# Patient Record
Sex: Male | Born: 1965 | Race: Black or African American | Hispanic: No | Marital: Married | Smoking: Heavy tobacco smoker
Health system: Southern US, Community
[De-identification: ages and names within clinical notes are randomized; demographics above are authoritative.]

## PROBLEM LIST (undated history)

## (undated) DIAGNOSIS — D649 Anemia, unspecified: Secondary | ICD-10-CM

## (undated) DIAGNOSIS — Z831 Family history of other infectious and parasitic diseases: Secondary | ICD-10-CM

## (undated) DIAGNOSIS — I214 Non-ST elevation (NSTEMI) myocardial infarction: Secondary | ICD-10-CM

## (undated) DIAGNOSIS — A529 Late syphilis, unspecified: Secondary | ICD-10-CM

## (undated) DIAGNOSIS — A630 Anogenital (venereal) warts: Secondary | ICD-10-CM

## (undated) DIAGNOSIS — Z992 Dependence on renal dialysis: Secondary | ICD-10-CM

## (undated) DIAGNOSIS — I350 Nonrheumatic aortic (valve) stenosis: Secondary | ICD-10-CM

## (undated) DIAGNOSIS — Z9289 Personal history of other medical treatment: Secondary | ICD-10-CM

## (undated) DIAGNOSIS — I10 Essential (primary) hypertension: Secondary | ICD-10-CM

## (undated) DIAGNOSIS — R197 Diarrhea, unspecified: Secondary | ICD-10-CM

## (undated) DIAGNOSIS — M1990 Unspecified osteoarthritis, unspecified site: Secondary | ICD-10-CM

## (undated) DIAGNOSIS — B20 Human immunodeficiency virus [HIV] disease: Secondary | ICD-10-CM

## (undated) DIAGNOSIS — T82868A Thrombosis of vascular prosthetic devices, implants and grafts, initial encounter: Secondary | ICD-10-CM

## (undated) DIAGNOSIS — Z21 Asymptomatic human immunodeficiency virus [HIV] infection status: Secondary | ICD-10-CM

## (undated) DIAGNOSIS — N186 End stage renal disease: Secondary | ICD-10-CM

## (undated) DIAGNOSIS — I2510 Atherosclerotic heart disease of native coronary artery without angina pectoris: Secondary | ICD-10-CM

## (undated) HISTORY — DX: Human immunodeficiency virus (HIV) disease: B20

## (undated) HISTORY — DX: Essential (primary) hypertension: I10

## (undated) HISTORY — PX: INCISION AND DRAINAGE ABSCESS: SHX5864

## (undated) HISTORY — DX: Anogenital (venereal) warts: A63.0

## (undated) HISTORY — DX: Unspecified osteoarthritis, unspecified site: M19.90

## (undated) HISTORY — PX: TONSILLECTOMY: SUR1361

## (undated) HISTORY — DX: Asymptomatic human immunodeficiency virus (hiv) infection status: Z21

## (undated) HISTORY — DX: Family history of other infectious and parasitic diseases: Z83.1

## (undated) HISTORY — DX: Late syphilis, unspecified: A52.9

## (undated) HISTORY — PX: APPENDECTOMY: SHX54

---

## 1999-02-08 ENCOUNTER — Encounter: Payer: Self-pay | Admitting: Emergency Medicine

## 1999-02-08 ENCOUNTER — Encounter: Payer: Self-pay | Admitting: Family Medicine

## 1999-02-09 ENCOUNTER — Encounter: Payer: Self-pay | Admitting: Family Medicine

## 1999-02-09 ENCOUNTER — Inpatient Hospital Stay (HOSPITAL_COMMUNITY): Admission: EM | Admit: 1999-02-09 | Discharge: 1999-02-12 | Payer: Self-pay | Admitting: Emergency Medicine

## 1999-02-10 ENCOUNTER — Encounter: Payer: Self-pay | Admitting: Infectious Diseases

## 1999-02-10 ENCOUNTER — Encounter: Payer: Self-pay | Admitting: Family Medicine

## 1999-02-22 ENCOUNTER — Inpatient Hospital Stay (HOSPITAL_COMMUNITY): Admission: RE | Admit: 1999-02-22 | Discharge: 1999-04-02 | Payer: Self-pay | Admitting: Nephrology

## 1999-02-22 ENCOUNTER — Encounter: Payer: Self-pay | Admitting: Nephrology

## 1999-02-25 ENCOUNTER — Encounter: Payer: Self-pay | Admitting: Infectious Diseases

## 1999-02-26 ENCOUNTER — Encounter: Payer: Self-pay | Admitting: General Surgery

## 1999-02-27 ENCOUNTER — Encounter: Payer: Self-pay | Admitting: Nephrology

## 1999-02-28 ENCOUNTER — Encounter: Payer: Self-pay | Admitting: Nephrology

## 1999-03-01 ENCOUNTER — Encounter: Payer: Self-pay | Admitting: General Surgery

## 1999-03-08 ENCOUNTER — Encounter: Payer: Self-pay | Admitting: Nephrology

## 1999-03-19 ENCOUNTER — Encounter: Payer: Self-pay | Admitting: Nephrology

## 1999-03-22 ENCOUNTER — Encounter: Payer: Self-pay | Admitting: Nephrology

## 1999-04-02 ENCOUNTER — Encounter: Payer: Self-pay | Admitting: Nephrology

## 1999-04-10 ENCOUNTER — Encounter: Admission: RE | Admit: 1999-04-10 | Discharge: 1999-04-10 | Payer: Self-pay | Admitting: Internal Medicine

## 1999-04-11 ENCOUNTER — Encounter: Admission: RE | Admit: 1999-04-11 | Discharge: 1999-04-11 | Payer: Self-pay | Admitting: Internal Medicine

## 1999-04-26 ENCOUNTER — Encounter: Admission: RE | Admit: 1999-04-26 | Discharge: 1999-04-26 | Payer: Self-pay | Admitting: Internal Medicine

## 1999-04-26 ENCOUNTER — Ambulatory Visit (HOSPITAL_COMMUNITY): Admission: RE | Admit: 1999-04-26 | Discharge: 1999-04-26 | Payer: Self-pay | Admitting: Internal Medicine

## 1999-04-26 ENCOUNTER — Encounter (INDEPENDENT_AMBULATORY_CARE_PROVIDER_SITE_OTHER): Payer: Self-pay | Admitting: *Deleted

## 1999-04-26 LAB — CONVERTED CEMR LAB: CD4 Count: 220 microliters

## 1999-05-01 ENCOUNTER — Ambulatory Visit (HOSPITAL_COMMUNITY): Admission: RE | Admit: 1999-05-01 | Discharge: 1999-05-01 | Payer: Self-pay | Admitting: *Deleted

## 1999-05-02 ENCOUNTER — Ambulatory Visit (HOSPITAL_COMMUNITY): Admission: RE | Admit: 1999-05-02 | Discharge: 1999-05-02 | Payer: Self-pay | Admitting: *Deleted

## 1999-05-13 ENCOUNTER — Encounter: Admission: RE | Admit: 1999-05-13 | Discharge: 1999-05-13 | Payer: Self-pay | Admitting: Internal Medicine

## 1999-06-07 ENCOUNTER — Ambulatory Visit (HOSPITAL_COMMUNITY): Admission: RE | Admit: 1999-06-07 | Discharge: 1999-06-07 | Payer: Self-pay | Admitting: Hematology & Oncology

## 1999-06-10 ENCOUNTER — Encounter: Admission: RE | Admit: 1999-06-10 | Discharge: 1999-06-10 | Payer: Self-pay | Admitting: Internal Medicine

## 1999-08-09 ENCOUNTER — Ambulatory Visit (HOSPITAL_COMMUNITY): Admission: RE | Admit: 1999-08-09 | Discharge: 1999-08-09 | Payer: Self-pay | Admitting: Nephrology

## 1999-08-19 ENCOUNTER — Ambulatory Visit (HOSPITAL_COMMUNITY): Admission: RE | Admit: 1999-08-19 | Discharge: 1999-08-19 | Payer: Self-pay | Admitting: Internal Medicine

## 1999-08-19 ENCOUNTER — Ambulatory Visit (HOSPITAL_COMMUNITY): Admission: RE | Admit: 1999-08-19 | Discharge: 1999-08-19 | Payer: Self-pay | Admitting: Vascular Surgery

## 1999-08-19 ENCOUNTER — Encounter: Admission: RE | Admit: 1999-08-19 | Discharge: 1999-08-19 | Payer: Self-pay | Admitting: Internal Medicine

## 1999-09-09 ENCOUNTER — Encounter: Admission: RE | Admit: 1999-09-09 | Discharge: 1999-09-09 | Payer: Self-pay | Admitting: Internal Medicine

## 1999-09-11 ENCOUNTER — Ambulatory Visit (HOSPITAL_COMMUNITY): Admission: RE | Admit: 1999-09-11 | Discharge: 1999-09-11 | Payer: Self-pay | Admitting: Nephrology

## 1999-10-14 ENCOUNTER — Encounter: Admission: RE | Admit: 1999-10-14 | Discharge: 1999-10-14 | Payer: Self-pay | Admitting: Internal Medicine

## 1999-10-16 ENCOUNTER — Ambulatory Visit (HOSPITAL_COMMUNITY): Admission: RE | Admit: 1999-10-16 | Discharge: 1999-10-16 | Payer: Self-pay | Admitting: Nephrology

## 1999-10-31 ENCOUNTER — Emergency Department (HOSPITAL_COMMUNITY): Admission: EM | Admit: 1999-10-31 | Discharge: 1999-10-31 | Payer: Self-pay | Admitting: Emergency Medicine

## 1999-10-31 ENCOUNTER — Encounter: Payer: Self-pay | Admitting: Emergency Medicine

## 1999-11-22 ENCOUNTER — Emergency Department (HOSPITAL_COMMUNITY): Admission: EM | Admit: 1999-11-22 | Discharge: 1999-11-22 | Payer: Self-pay | Admitting: Emergency Medicine

## 1999-11-22 ENCOUNTER — Ambulatory Visit (HOSPITAL_COMMUNITY): Admission: RE | Admit: 1999-11-22 | Discharge: 1999-11-22 | Payer: Self-pay | Admitting: *Deleted

## 1999-12-09 ENCOUNTER — Encounter: Admission: RE | Admit: 1999-12-09 | Discharge: 1999-12-09 | Payer: Self-pay | Admitting: Internal Medicine

## 1999-12-09 ENCOUNTER — Ambulatory Visit (HOSPITAL_COMMUNITY): Admission: RE | Admit: 1999-12-09 | Discharge: 1999-12-09 | Payer: Self-pay | Admitting: Internal Medicine

## 1999-12-23 ENCOUNTER — Encounter: Admission: RE | Admit: 1999-12-23 | Discharge: 1999-12-23 | Payer: Self-pay | Admitting: Internal Medicine

## 2000-02-24 ENCOUNTER — Ambulatory Visit (HOSPITAL_COMMUNITY): Admission: RE | Admit: 2000-02-24 | Discharge: 2000-02-24 | Payer: Self-pay | Admitting: Internal Medicine

## 2000-02-24 ENCOUNTER — Encounter: Admission: RE | Admit: 2000-02-24 | Discharge: 2000-02-24 | Payer: Self-pay | Admitting: Internal Medicine

## 2000-03-16 ENCOUNTER — Encounter: Admission: RE | Admit: 2000-03-16 | Discharge: 2000-03-16 | Payer: Self-pay | Admitting: Internal Medicine

## 2000-05-21 ENCOUNTER — Emergency Department (HOSPITAL_COMMUNITY): Admission: EM | Admit: 2000-05-21 | Discharge: 2000-05-21 | Payer: Self-pay | Admitting: Emergency Medicine

## 2000-05-21 ENCOUNTER — Encounter: Payer: Self-pay | Admitting: Emergency Medicine

## 2000-07-01 ENCOUNTER — Ambulatory Visit (HOSPITAL_COMMUNITY): Admission: RE | Admit: 2000-07-01 | Discharge: 2000-07-01 | Payer: Self-pay | Admitting: Internal Medicine

## 2000-07-01 ENCOUNTER — Encounter: Admission: RE | Admit: 2000-07-01 | Discharge: 2000-07-01 | Payer: Self-pay | Admitting: Internal Medicine

## 2000-07-06 ENCOUNTER — Encounter (INDEPENDENT_AMBULATORY_CARE_PROVIDER_SITE_OTHER): Payer: Self-pay | Admitting: *Deleted

## 2000-07-06 ENCOUNTER — Ambulatory Visit (HOSPITAL_BASED_OUTPATIENT_CLINIC_OR_DEPARTMENT_OTHER): Admission: RE | Admit: 2000-07-06 | Discharge: 2000-07-06 | Payer: Self-pay | Admitting: General Surgery

## 2000-09-14 ENCOUNTER — Encounter: Admission: RE | Admit: 2000-09-14 | Discharge: 2000-09-14 | Payer: Self-pay | Admitting: *Deleted

## 2000-09-30 ENCOUNTER — Encounter: Payer: Self-pay | Admitting: Nephrology

## 2000-09-30 ENCOUNTER — Ambulatory Visit (HOSPITAL_COMMUNITY): Admission: RE | Admit: 2000-09-30 | Discharge: 2000-09-30 | Payer: Self-pay | Admitting: Nephrology

## 2000-10-14 ENCOUNTER — Encounter: Payer: Self-pay | Admitting: Vascular Surgery

## 2000-10-16 ENCOUNTER — Ambulatory Visit (HOSPITAL_COMMUNITY): Admission: RE | Admit: 2000-10-16 | Discharge: 2000-10-16 | Payer: Self-pay | Admitting: Vascular Surgery

## 2000-11-23 ENCOUNTER — Ambulatory Visit (HOSPITAL_COMMUNITY): Admission: RE | Admit: 2000-11-23 | Discharge: 2000-11-23 | Payer: Self-pay | Admitting: Internal Medicine

## 2000-11-23 ENCOUNTER — Encounter: Admission: RE | Admit: 2000-11-23 | Discharge: 2000-11-23 | Payer: Self-pay | Admitting: Internal Medicine

## 2000-12-09 ENCOUNTER — Encounter: Admission: RE | Admit: 2000-12-09 | Discharge: 2000-12-09 | Payer: Self-pay | Admitting: *Deleted

## 2000-12-14 ENCOUNTER — Encounter: Admission: RE | Admit: 2000-12-14 | Discharge: 2000-12-14 | Payer: Self-pay | Admitting: Internal Medicine

## 2000-12-26 ENCOUNTER — Encounter: Admission: RE | Admit: 2000-12-26 | Discharge: 2000-12-26 | Payer: Self-pay | Admitting: *Deleted

## 2000-12-28 ENCOUNTER — Ambulatory Visit (HOSPITAL_COMMUNITY): Admission: RE | Admit: 2000-12-28 | Discharge: 2000-12-28 | Payer: Self-pay | Admitting: *Deleted

## 2000-12-31 ENCOUNTER — Encounter (INDEPENDENT_AMBULATORY_CARE_PROVIDER_SITE_OTHER): Payer: Self-pay | Admitting: *Deleted

## 2000-12-31 ENCOUNTER — Encounter: Payer: Self-pay | Admitting: Nephrology

## 2000-12-31 ENCOUNTER — Encounter (INDEPENDENT_AMBULATORY_CARE_PROVIDER_SITE_OTHER): Payer: Self-pay | Admitting: Specialist

## 2000-12-31 ENCOUNTER — Encounter: Payer: Self-pay | Admitting: Emergency Medicine

## 2000-12-31 ENCOUNTER — Inpatient Hospital Stay (HOSPITAL_COMMUNITY): Admission: EM | Admit: 2000-12-31 | Discharge: 2001-01-17 | Payer: Self-pay | Admitting: Emergency Medicine

## 2001-01-04 ENCOUNTER — Encounter: Payer: Self-pay | Admitting: Nephrology

## 2001-01-12 ENCOUNTER — Encounter: Payer: Self-pay | Admitting: Nephrology

## 2001-02-02 ENCOUNTER — Encounter: Admission: RE | Admit: 2001-02-02 | Discharge: 2001-02-02 | Payer: Self-pay | Admitting: Infectious Diseases

## 2001-02-02 ENCOUNTER — Ambulatory Visit (HOSPITAL_COMMUNITY): Admission: RE | Admit: 2001-02-02 | Discharge: 2001-02-02 | Payer: Self-pay | Admitting: Infectious Diseases

## 2001-06-04 ENCOUNTER — Ambulatory Visit (HOSPITAL_COMMUNITY): Admission: RE | Admit: 2001-06-04 | Discharge: 2001-06-04 | Payer: Self-pay | Admitting: Nephrology

## 2001-06-04 ENCOUNTER — Encounter: Payer: Self-pay | Admitting: Nephrology

## 2001-06-14 ENCOUNTER — Encounter: Payer: Self-pay | Admitting: Orthopedic Surgery

## 2001-06-14 ENCOUNTER — Encounter: Admission: RE | Admit: 2001-06-14 | Discharge: 2001-06-14 | Payer: Self-pay | Admitting: Orthopedic Surgery

## 2001-11-18 ENCOUNTER — Emergency Department (HOSPITAL_COMMUNITY): Admission: EM | Admit: 2001-11-18 | Discharge: 2001-11-19 | Payer: Self-pay | Admitting: *Deleted

## 2001-11-19 ENCOUNTER — Encounter: Payer: Self-pay | Admitting: Internal Medicine

## 2002-01-04 ENCOUNTER — Ambulatory Visit (HOSPITAL_COMMUNITY): Admission: RE | Admit: 2002-01-04 | Discharge: 2002-01-04 | Payer: Self-pay | Admitting: Internal Medicine

## 2002-01-04 ENCOUNTER — Encounter: Admission: RE | Admit: 2002-01-04 | Discharge: 2002-01-04 | Payer: Self-pay | Admitting: Internal Medicine

## 2002-01-21 ENCOUNTER — Encounter: Admission: RE | Admit: 2002-01-21 | Discharge: 2002-01-21 | Payer: Self-pay | Admitting: Internal Medicine

## 2002-05-08 ENCOUNTER — Emergency Department (HOSPITAL_COMMUNITY): Admission: EM | Admit: 2002-05-08 | Discharge: 2002-05-08 | Payer: Self-pay | Admitting: Emergency Medicine

## 2002-05-08 ENCOUNTER — Encounter: Payer: Self-pay | Admitting: Emergency Medicine

## 2002-05-09 ENCOUNTER — Encounter: Payer: Self-pay | Admitting: Emergency Medicine

## 2002-06-06 ENCOUNTER — Emergency Department (HOSPITAL_COMMUNITY): Admission: EM | Admit: 2002-06-06 | Discharge: 2002-06-06 | Payer: Self-pay | Admitting: Emergency Medicine

## 2002-06-10 ENCOUNTER — Emergency Department (HOSPITAL_COMMUNITY): Admission: EM | Admit: 2002-06-10 | Discharge: 2002-06-10 | Payer: Self-pay | Admitting: Emergency Medicine

## 2003-02-09 ENCOUNTER — Emergency Department (HOSPITAL_COMMUNITY): Admission: EM | Admit: 2003-02-09 | Discharge: 2003-02-09 | Payer: Self-pay | Admitting: Emergency Medicine

## 2003-09-11 ENCOUNTER — Emergency Department (HOSPITAL_COMMUNITY): Admission: EM | Admit: 2003-09-11 | Discharge: 2003-09-11 | Payer: Self-pay | Admitting: Family Medicine

## 2004-01-05 ENCOUNTER — Ambulatory Visit (HOSPITAL_COMMUNITY): Admission: RE | Admit: 2004-01-05 | Discharge: 2004-01-05 | Payer: Self-pay | Admitting: Nephrology

## 2004-01-10 ENCOUNTER — Emergency Department (HOSPITAL_COMMUNITY): Admission: EM | Admit: 2004-01-10 | Discharge: 2004-01-10 | Payer: Self-pay | Admitting: Emergency Medicine

## 2004-02-17 ENCOUNTER — Ambulatory Visit (HOSPITAL_COMMUNITY): Admission: RE | Admit: 2004-02-17 | Discharge: 2004-02-17 | Payer: Self-pay | Admitting: Nephrology

## 2004-02-17 ENCOUNTER — Inpatient Hospital Stay (HOSPITAL_COMMUNITY): Admission: EM | Admit: 2004-02-17 | Discharge: 2004-02-19 | Payer: Self-pay | Admitting: *Deleted

## 2004-02-21 ENCOUNTER — Encounter: Admission: RE | Admit: 2004-02-21 | Discharge: 2004-02-21 | Payer: Self-pay | Admitting: Nephrology

## 2004-03-05 ENCOUNTER — Encounter: Admission: RE | Admit: 2004-03-05 | Discharge: 2004-03-05 | Payer: Self-pay | Admitting: Internal Medicine

## 2004-04-08 ENCOUNTER — Encounter: Admission: RE | Admit: 2004-04-08 | Discharge: 2004-04-08 | Payer: Self-pay | Admitting: Internal Medicine

## 2004-07-12 ENCOUNTER — Encounter: Admission: RE | Admit: 2004-07-12 | Discharge: 2004-07-12 | Payer: Self-pay | Admitting: Nephrology

## 2004-09-27 ENCOUNTER — Encounter: Admission: RE | Admit: 2004-09-27 | Discharge: 2004-09-27 | Payer: Self-pay | Admitting: Nephrology

## 2004-10-16 ENCOUNTER — Ambulatory Visit (HOSPITAL_COMMUNITY): Admission: RE | Admit: 2004-10-16 | Discharge: 2004-10-16 | Payer: Self-pay | Admitting: Nephrology

## 2004-10-25 ENCOUNTER — Ambulatory Visit: Payer: Self-pay | Admitting: Cardiology

## 2004-10-31 ENCOUNTER — Ambulatory Visit (HOSPITAL_COMMUNITY): Admission: RE | Admit: 2004-10-31 | Discharge: 2004-10-31 | Payer: Self-pay | Admitting: Nephrology

## 2004-11-20 ENCOUNTER — Ambulatory Visit: Payer: Self-pay

## 2004-11-29 ENCOUNTER — Encounter: Payer: Self-pay | Admitting: Cardiology

## 2004-11-29 ENCOUNTER — Ambulatory Visit (HOSPITAL_COMMUNITY): Admission: RE | Admit: 2004-11-29 | Discharge: 2004-11-29 | Payer: Self-pay | Admitting: Cardiology

## 2004-11-29 ENCOUNTER — Ambulatory Visit: Payer: Self-pay | Admitting: Cardiology

## 2005-02-04 ENCOUNTER — Ambulatory Visit: Payer: Self-pay | Admitting: Internal Medicine

## 2005-02-04 ENCOUNTER — Ambulatory Visit (HOSPITAL_COMMUNITY): Admission: RE | Admit: 2005-02-04 | Discharge: 2005-02-04 | Payer: Self-pay | Admitting: Internal Medicine

## 2005-02-26 ENCOUNTER — Ambulatory Visit (HOSPITAL_COMMUNITY): Admission: RE | Admit: 2005-02-26 | Discharge: 2005-02-26 | Payer: Self-pay | Admitting: Nephrology

## 2005-04-14 ENCOUNTER — Encounter: Admission: RE | Admit: 2005-04-14 | Discharge: 2005-04-14 | Payer: Self-pay | Admitting: Nephrology

## 2005-06-01 ENCOUNTER — Emergency Department (HOSPITAL_COMMUNITY): Admission: EM | Admit: 2005-06-01 | Discharge: 2005-06-01 | Payer: Self-pay | Admitting: Emergency Medicine

## 2005-07-28 ENCOUNTER — Ambulatory Visit: Payer: Self-pay | Admitting: Internal Medicine

## 2005-07-28 ENCOUNTER — Ambulatory Visit (HOSPITAL_COMMUNITY): Admission: RE | Admit: 2005-07-28 | Discharge: 2005-07-28 | Payer: Self-pay | Admitting: Internal Medicine

## 2005-07-28 ENCOUNTER — Encounter (INDEPENDENT_AMBULATORY_CARE_PROVIDER_SITE_OTHER): Payer: Self-pay | Admitting: *Deleted

## 2005-07-28 LAB — CONVERTED CEMR LAB: CD4 Count: 170 microliters

## 2005-07-30 ENCOUNTER — Emergency Department (HOSPITAL_COMMUNITY): Admission: EM | Admit: 2005-07-30 | Discharge: 2005-07-30 | Payer: Self-pay | Admitting: Emergency Medicine

## 2005-08-26 ENCOUNTER — Ambulatory Visit: Payer: Self-pay | Admitting: Internal Medicine

## 2005-08-27 ENCOUNTER — Ambulatory Visit (HOSPITAL_COMMUNITY): Admission: RE | Admit: 2005-08-27 | Discharge: 2005-08-27 | Payer: Self-pay | Admitting: Nephrology

## 2005-09-06 ENCOUNTER — Emergency Department (HOSPITAL_COMMUNITY): Admission: EM | Admit: 2005-09-06 | Discharge: 2005-09-06 | Payer: Self-pay | Admitting: Emergency Medicine

## 2005-10-29 ENCOUNTER — Ambulatory Visit: Payer: Self-pay | Admitting: Infectious Diseases

## 2005-10-29 ENCOUNTER — Encounter (INDEPENDENT_AMBULATORY_CARE_PROVIDER_SITE_OTHER): Payer: Self-pay | Admitting: *Deleted

## 2005-10-29 LAB — CONVERTED CEMR LAB: CD4 Count: 240 microliters

## 2005-11-19 ENCOUNTER — Emergency Department (HOSPITAL_COMMUNITY): Admission: EM | Admit: 2005-11-19 | Discharge: 2005-11-19 | Payer: Self-pay | Admitting: Emergency Medicine

## 2005-12-16 ENCOUNTER — Ambulatory Visit: Payer: Self-pay | Admitting: Internal Medicine

## 2005-12-19 ENCOUNTER — Encounter: Admission: RE | Admit: 2005-12-19 | Discharge: 2005-12-19 | Payer: Self-pay | Admitting: Nephrology

## 2006-01-02 ENCOUNTER — Encounter: Admission: RE | Admit: 2006-01-02 | Discharge: 2006-01-02 | Payer: Self-pay | Admitting: Nephrology

## 2006-01-08 ENCOUNTER — Encounter: Payer: Self-pay | Admitting: Internal Medicine

## 2006-01-08 ENCOUNTER — Encounter (INDEPENDENT_AMBULATORY_CARE_PROVIDER_SITE_OTHER): Payer: Self-pay | Admitting: *Deleted

## 2006-01-08 LAB — CONVERTED CEMR LAB: CD4 Count: 447 microliters

## 2006-01-09 ENCOUNTER — Ambulatory Visit (HOSPITAL_COMMUNITY): Admission: RE | Admit: 2006-01-09 | Discharge: 2006-01-09 | Payer: Self-pay | Admitting: Nephrology

## 2006-01-12 ENCOUNTER — Ambulatory Visit (HOSPITAL_COMMUNITY): Admission: RE | Admit: 2006-01-12 | Discharge: 2006-01-12 | Payer: Self-pay | Admitting: Nephrology

## 2006-01-28 ENCOUNTER — Ambulatory Visit: Payer: Self-pay | Admitting: Emergency Medicine

## 2006-01-28 ENCOUNTER — Encounter: Payer: Self-pay | Admitting: Nephrology

## 2006-06-05 ENCOUNTER — Ambulatory Visit (HOSPITAL_COMMUNITY): Admission: RE | Admit: 2006-06-05 | Discharge: 2006-06-05 | Payer: Self-pay | Admitting: Nephrology

## 2006-07-22 ENCOUNTER — Ambulatory Visit: Payer: Self-pay | Admitting: Internal Medicine

## 2006-10-19 DIAGNOSIS — A539 Syphilis, unspecified: Secondary | ICD-10-CM

## 2006-10-19 DIAGNOSIS — A318 Other mycobacterial infections: Secondary | ICD-10-CM

## 2006-10-19 DIAGNOSIS — F329 Major depressive disorder, single episode, unspecified: Secondary | ICD-10-CM | POA: Insufficient documentation

## 2006-10-19 DIAGNOSIS — F17200 Nicotine dependence, unspecified, uncomplicated: Secondary | ICD-10-CM | POA: Insufficient documentation

## 2006-10-19 DIAGNOSIS — A6000 Herpesviral infection of urogenital system, unspecified: Secondary | ICD-10-CM | POA: Insufficient documentation

## 2006-10-19 DIAGNOSIS — B078 Other viral warts: Secondary | ICD-10-CM | POA: Insufficient documentation

## 2006-10-19 DIAGNOSIS — N186 End stage renal disease: Secondary | ICD-10-CM

## 2006-11-23 ENCOUNTER — Encounter (INDEPENDENT_AMBULATORY_CARE_PROVIDER_SITE_OTHER): Payer: Self-pay | Admitting: *Deleted

## 2006-11-23 LAB — CONVERTED CEMR LAB

## 2006-12-06 ENCOUNTER — Encounter (INDEPENDENT_AMBULATORY_CARE_PROVIDER_SITE_OTHER): Payer: Self-pay | Admitting: *Deleted

## 2007-01-17 ENCOUNTER — Emergency Department (HOSPITAL_COMMUNITY): Admission: EM | Admit: 2007-01-17 | Discharge: 2007-01-17 | Payer: Self-pay | Admitting: Emergency Medicine

## 2007-02-15 ENCOUNTER — Telehealth: Payer: Self-pay | Admitting: Internal Medicine

## 2007-02-16 ENCOUNTER — Ambulatory Visit: Payer: Self-pay | Admitting: Internal Medicine

## 2007-02-16 ENCOUNTER — Emergency Department (HOSPITAL_COMMUNITY): Admission: EM | Admit: 2007-02-16 | Discharge: 2007-02-16 | Payer: Self-pay | Admitting: Emergency Medicine

## 2007-03-25 ENCOUNTER — Encounter: Payer: Self-pay | Admitting: Internal Medicine

## 2007-07-08 ENCOUNTER — Telehealth: Payer: Self-pay | Admitting: Internal Medicine

## 2007-07-30 ENCOUNTER — Encounter: Admission: RE | Admit: 2007-07-30 | Discharge: 2007-07-30 | Payer: Self-pay | Admitting: Internal Medicine

## 2007-07-30 ENCOUNTER — Ambulatory Visit: Payer: Self-pay | Admitting: Internal Medicine

## 2007-07-30 LAB — CONVERTED CEMR LAB
ALT: 21 units/L (ref 0–53)
AST: 29 units/L (ref 0–37)
Alkaline Phosphatase: 303 units/L — ABNORMAL HIGH (ref 39–117)
Basophils Absolute: 0 10*3/uL (ref 0.0–0.1)
Basophils Relative: 1 % (ref 0–1)
Creatinine, Ser: 8.88 mg/dL — ABNORMAL HIGH (ref 0.40–1.50)
Eosinophils Absolute: 0 10*3/uL (ref 0.0–0.7)
Eosinophils Relative: 0 % (ref 0–5)
HCT: 42 % (ref 39.0–52.0)
Hemoglobin: 12.2 g/dL — ABNORMAL LOW (ref 13.0–17.0)
MCHC: 29 g/dL — ABNORMAL LOW (ref 30.0–36.0)
Monocytes Absolute: 0.9 10*3/uL — ABNORMAL HIGH (ref 0.2–0.7)
RDW: 21 % — ABNORMAL HIGH (ref 11.5–14.0)
Total Bilirubin: 0.6 mg/dL (ref 0.3–1.2)

## 2007-08-12 ENCOUNTER — Ambulatory Visit: Payer: Self-pay | Admitting: Internal Medicine

## 2007-08-12 LAB — CONVERTED CEMR LAB
HIV 1 RNA Quant: 16400 copies/mL — ABNORMAL HIGH (ref <50)
HIV-1 RNA Quant, Log: 4.21 — ABNORMAL HIGH (ref <1.70)

## 2007-08-20 ENCOUNTER — Telehealth: Payer: Self-pay | Admitting: Internal Medicine

## 2007-08-20 ENCOUNTER — Ambulatory Visit: Payer: Self-pay | Admitting: Internal Medicine

## 2007-08-31 ENCOUNTER — Encounter: Payer: Self-pay | Admitting: Internal Medicine

## 2007-09-13 ENCOUNTER — Encounter (INDEPENDENT_AMBULATORY_CARE_PROVIDER_SITE_OTHER): Payer: Self-pay | Admitting: *Deleted

## 2007-09-14 ENCOUNTER — Ambulatory Visit: Payer: Self-pay | Admitting: Internal Medicine

## 2007-11-15 ENCOUNTER — Encounter: Payer: Self-pay | Admitting: Internal Medicine

## 2007-11-15 ENCOUNTER — Encounter: Admission: RE | Admit: 2007-11-15 | Discharge: 2007-11-15 | Payer: Self-pay | Admitting: Infectious Disease

## 2007-11-15 ENCOUNTER — Ambulatory Visit: Payer: Self-pay | Admitting: Infectious Disease

## 2007-11-15 LAB — CONVERTED CEMR LAB
Albumin: 3.7 g/dL (ref 3.5–5.2)
BUN: 41 mg/dL — ABNORMAL HIGH (ref 6–23)
CO2: 23 meq/L (ref 19–32)
Calcium: 9.8 mg/dL (ref 8.4–10.5)
Cholesterol: 194 mg/dL (ref 0–200)
Glucose, Bld: 72 mg/dL (ref 70–99)
HCT: 41.9 % (ref 39.0–52.0)
HDL: 33 mg/dL — ABNORMAL LOW (ref >39)
HIV-1 RNA Quant, Log: 3.21 — ABNORMAL HIGH (ref <1.70)
Lymphocytes Relative: 35 % (ref 12–46)
Lymphs Abs: 1.7 10*3/uL (ref 0.7–4.0)
MCV: 99.8 fL (ref 78.0–100.0)
Monocytes Relative: 17 % — ABNORMAL HIGH (ref 3–12)
Neutrophils Relative %: 45 % (ref 43–77)
Platelets: 141 10*3/uL — ABNORMAL LOW (ref 150–400)
Potassium: 6.1 meq/L — ABNORMAL HIGH (ref 3.5–5.3)
RBC: 4.2 M/uL — ABNORMAL LOW (ref 4.22–5.81)
Sodium: 144 meq/L (ref 135–145)
Total Protein: 8.3 g/dL (ref 6.0–8.3)
Triglycerides: 338 mg/dL — ABNORMAL HIGH (ref <150)
WBC: 5 10*3/uL (ref 4.0–10.5)

## 2007-12-20 ENCOUNTER — Ambulatory Visit: Payer: Self-pay | Admitting: Internal Medicine

## 2007-12-20 LAB — CONVERTED CEMR LAB
HIV 1 RNA Quant: 427000 copies/mL — ABNORMAL HIGH (ref <50)
HIV-1 RNA Quant, Log: 5.63 — ABNORMAL HIGH (ref <1.70)

## 2008-01-28 ENCOUNTER — Ambulatory Visit (HOSPITAL_COMMUNITY): Admission: RE | Admit: 2008-01-28 | Discharge: 2008-01-28 | Payer: Self-pay | Admitting: Nephrology

## 2008-02-24 ENCOUNTER — Encounter: Payer: Self-pay | Admitting: Internal Medicine

## 2008-03-17 ENCOUNTER — Encounter: Admission: RE | Admit: 2008-03-17 | Discharge: 2008-03-17 | Payer: Self-pay | Admitting: Nephrology

## 2008-04-11 ENCOUNTER — Encounter: Admission: RE | Admit: 2008-04-11 | Discharge: 2008-04-11 | Payer: Self-pay | Admitting: Nephrology

## 2008-05-01 ENCOUNTER — Ambulatory Visit: Payer: Self-pay

## 2008-05-01 ENCOUNTER — Encounter (INDEPENDENT_AMBULATORY_CARE_PROVIDER_SITE_OTHER): Payer: Self-pay | Admitting: Nephrology

## 2008-07-22 ENCOUNTER — Emergency Department (HOSPITAL_COMMUNITY): Admission: EM | Admit: 2008-07-22 | Discharge: 2008-07-22 | Payer: Self-pay | Admitting: Cardiology

## 2008-07-27 ENCOUNTER — Encounter: Payer: Self-pay | Admitting: Internal Medicine

## 2008-08-12 ENCOUNTER — Ambulatory Visit (HOSPITAL_COMMUNITY): Admission: RE | Admit: 2008-08-12 | Discharge: 2008-08-12 | Payer: Self-pay | Admitting: Nephrology

## 2008-08-13 ENCOUNTER — Ambulatory Visit (HOSPITAL_COMMUNITY): Admission: RE | Admit: 2008-08-13 | Discharge: 2008-08-13 | Payer: Self-pay | Admitting: Nephrology

## 2008-08-16 ENCOUNTER — Ambulatory Visit: Payer: Self-pay | Admitting: Internal Medicine

## 2008-08-18 ENCOUNTER — Ambulatory Visit (HOSPITAL_COMMUNITY): Admission: RE | Admit: 2008-08-18 | Discharge: 2008-08-18 | Payer: Self-pay | Admitting: Internal Medicine

## 2008-08-18 ENCOUNTER — Ambulatory Visit: Payer: Self-pay | Admitting: Internal Medicine

## 2008-08-18 ENCOUNTER — Encounter: Payer: Self-pay | Admitting: Internal Medicine

## 2008-08-24 ENCOUNTER — Encounter: Payer: Self-pay | Admitting: Emergency Medicine

## 2008-08-25 ENCOUNTER — Ambulatory Visit: Payer: Self-pay | Admitting: Internal Medicine

## 2008-08-25 ENCOUNTER — Inpatient Hospital Stay (HOSPITAL_COMMUNITY): Admission: EM | Admit: 2008-08-25 | Discharge: 2008-08-28 | Payer: Self-pay | Admitting: Internal Medicine

## 2008-09-03 ENCOUNTER — Encounter: Payer: Self-pay | Admitting: Internal Medicine

## 2008-09-04 ENCOUNTER — Ambulatory Visit: Payer: Self-pay | Admitting: Surgery

## 2008-09-11 ENCOUNTER — Ambulatory Visit: Payer: Self-pay | Admitting: Internal Medicine

## 2008-09-13 ENCOUNTER — Ambulatory Visit: Payer: Self-pay | Admitting: Surgery

## 2008-09-13 ENCOUNTER — Ambulatory Visit (HOSPITAL_COMMUNITY): Admission: RE | Admit: 2008-09-13 | Discharge: 2008-09-13 | Payer: Self-pay | Admitting: Vascular Surgery

## 2008-09-16 ENCOUNTER — Emergency Department (HOSPITAL_COMMUNITY): Admission: EM | Admit: 2008-09-16 | Discharge: 2008-09-16 | Payer: Self-pay | Admitting: Emergency Medicine

## 2008-09-17 ENCOUNTER — Ambulatory Visit (HOSPITAL_COMMUNITY): Admission: EM | Admit: 2008-09-17 | Discharge: 2008-09-17 | Payer: Self-pay | Admitting: Emergency Medicine

## 2008-09-20 ENCOUNTER — Ambulatory Visit: Payer: Self-pay | Admitting: Vascular Surgery

## 2008-09-27 ENCOUNTER — Ambulatory Visit: Payer: Self-pay | Admitting: Vascular Surgery

## 2008-10-16 ENCOUNTER — Ambulatory Visit: Payer: Self-pay | Admitting: Vascular Surgery

## 2008-10-16 ENCOUNTER — Ambulatory Visit (HOSPITAL_COMMUNITY): Admission: RE | Admit: 2008-10-16 | Discharge: 2008-10-16 | Payer: Self-pay | Admitting: Vascular Surgery

## 2008-10-19 ENCOUNTER — Emergency Department (HOSPITAL_COMMUNITY): Admission: EM | Admit: 2008-10-19 | Discharge: 2008-10-19 | Payer: Self-pay | Admitting: Emergency Medicine

## 2008-11-09 ENCOUNTER — Encounter: Payer: Self-pay | Admitting: Internal Medicine

## 2008-11-10 ENCOUNTER — Ambulatory Visit: Payer: Self-pay | Admitting: Vascular Surgery

## 2008-11-17 ENCOUNTER — Telehealth: Payer: Self-pay | Admitting: Internal Medicine

## 2008-11-23 ENCOUNTER — Encounter: Payer: Self-pay | Admitting: Internal Medicine

## 2008-12-06 ENCOUNTER — Encounter: Admission: RE | Admit: 2008-12-06 | Discharge: 2008-12-06 | Payer: Self-pay | Admitting: Nephrology

## 2008-12-12 ENCOUNTER — Encounter: Payer: Self-pay | Admitting: Internal Medicine

## 2008-12-21 ENCOUNTER — Encounter: Payer: Self-pay | Admitting: Internal Medicine

## 2008-12-21 LAB — CONVERTED CEMR LAB
AST: 55 units/L
HCT: 33 %

## 2008-12-25 ENCOUNTER — Ambulatory Visit: Payer: Self-pay | Admitting: Internal Medicine

## 2008-12-25 DIAGNOSIS — R748 Abnormal levels of other serum enzymes: Secondary | ICD-10-CM | POA: Insufficient documentation

## 2008-12-25 DIAGNOSIS — R740 Nonspecific elevation of levels of transaminase and lactic acid dehydrogenase [LDH]: Secondary | ICD-10-CM

## 2008-12-26 DIAGNOSIS — D61818 Other pancytopenia: Secondary | ICD-10-CM | POA: Insufficient documentation

## 2009-01-03 ENCOUNTER — Ambulatory Visit (HOSPITAL_COMMUNITY): Admission: RE | Admit: 2009-01-03 | Discharge: 2009-01-03 | Payer: Self-pay | Admitting: Internal Medicine

## 2009-01-08 ENCOUNTER — Ambulatory Visit (HOSPITAL_COMMUNITY): Admission: RE | Admit: 2009-01-08 | Discharge: 2009-01-08 | Payer: Self-pay | Admitting: Nephrology

## 2009-01-26 ENCOUNTER — Ambulatory Visit: Payer: Self-pay | Admitting: Vascular Surgery

## 2009-02-05 ENCOUNTER — Ambulatory Visit (HOSPITAL_COMMUNITY): Admission: RE | Admit: 2009-02-05 | Discharge: 2009-02-05 | Payer: Self-pay | Admitting: Vascular Surgery

## 2009-02-05 ENCOUNTER — Ambulatory Visit: Payer: Self-pay | Admitting: Vascular Surgery

## 2009-02-08 ENCOUNTER — Ambulatory Visit (HOSPITAL_COMMUNITY): Admission: RE | Admit: 2009-02-08 | Discharge: 2009-02-08 | Payer: Self-pay | Admitting: Nephrology

## 2009-03-28 ENCOUNTER — Ambulatory Visit: Payer: Self-pay | Admitting: Vascular Surgery

## 2009-04-10 DIAGNOSIS — G609 Hereditary and idiopathic neuropathy, unspecified: Secondary | ICD-10-CM | POA: Insufficient documentation

## 2009-04-11 ENCOUNTER — Ambulatory Visit: Payer: Self-pay | Admitting: Cardiology

## 2009-04-11 DIAGNOSIS — R0789 Other chest pain: Secondary | ICD-10-CM

## 2009-04-27 ENCOUNTER — Encounter: Payer: Self-pay | Admitting: Cardiology

## 2009-04-27 ENCOUNTER — Ambulatory Visit: Payer: Self-pay | Admitting: Cardiology

## 2009-04-27 ENCOUNTER — Ambulatory Visit: Payer: Self-pay

## 2009-05-04 ENCOUNTER — Ambulatory Visit: Payer: Self-pay | Admitting: Cardiology

## 2009-05-04 ENCOUNTER — Ambulatory Visit (HOSPITAL_COMMUNITY): Admission: RE | Admit: 2009-05-04 | Discharge: 2009-05-04 | Payer: Self-pay | Admitting: Cardiology

## 2009-05-04 ENCOUNTER — Encounter: Payer: Self-pay | Admitting: Cardiology

## 2009-05-21 ENCOUNTER — Emergency Department (HOSPITAL_COMMUNITY): Admission: EM | Admit: 2009-05-21 | Discharge: 2009-05-21 | Payer: Self-pay | Admitting: Emergency Medicine

## 2009-06-03 ENCOUNTER — Emergency Department (HOSPITAL_COMMUNITY): Admission: EM | Admit: 2009-06-03 | Discharge: 2009-06-04 | Payer: Self-pay | Admitting: Emergency Medicine

## 2009-06-15 ENCOUNTER — Ambulatory Visit (HOSPITAL_COMMUNITY): Admission: RE | Admit: 2009-06-15 | Discharge: 2009-06-15 | Payer: Self-pay | Admitting: Nephrology

## 2009-07-01 ENCOUNTER — Emergency Department (HOSPITAL_COMMUNITY): Admission: EM | Admit: 2009-07-01 | Discharge: 2009-07-02 | Payer: Self-pay | Admitting: Emergency Medicine

## 2009-07-27 ENCOUNTER — Encounter: Payer: Self-pay | Admitting: Internal Medicine

## 2009-08-09 ENCOUNTER — Encounter: Payer: Self-pay | Admitting: Internal Medicine

## 2009-08-14 ENCOUNTER — Ambulatory Visit (HOSPITAL_COMMUNITY): Admission: RE | Admit: 2009-08-14 | Discharge: 2009-08-14 | Payer: Self-pay | Admitting: Nephrology

## 2009-09-19 ENCOUNTER — Telehealth: Payer: Self-pay | Admitting: Internal Medicine

## 2009-10-01 ENCOUNTER — Ambulatory Visit: Payer: Self-pay | Admitting: Surgery

## 2009-10-08 ENCOUNTER — Ambulatory Visit: Payer: Self-pay | Admitting: Internal Medicine

## 2009-10-08 LAB — CONVERTED CEMR LAB
Albumin: 3.1 g/dL — ABNORMAL LOW (ref 3.5–5.2)
Alkaline Phosphatase: 518 units/L — ABNORMAL HIGH (ref 39–117)
Basophils Absolute: 0 10*3/uL (ref 0.0–0.1)
Basophils Relative: 0 % (ref 0–1)
CO2: 23 meq/L (ref 19–32)
Calcium: 7 mg/dL — ABNORMAL LOW (ref 8.4–10.5)
Chloride: 106 meq/L (ref 96–112)
Cholesterol: 128 mg/dL (ref 0–200)
Glucose, Bld: 68 mg/dL — ABNORMAL LOW (ref 70–99)
HIV 1 RNA Quant: 2110000 copies/mL — ABNORMAL HIGH (ref <48)
HIV-1 RNA Quant, Log: 6.32 — ABNORMAL HIGH (ref <1.68)
MCHC: 29.8 g/dL — ABNORMAL LOW (ref 30.0–36.0)
Monocytes Relative: 17 % — ABNORMAL HIGH (ref 3–12)
Neutro Abs: 0.8 10*3/uL — ABNORMAL LOW (ref 1.7–7.7)
Neutrophils Relative %: 37 % — ABNORMAL LOW (ref 43–77)
Potassium: 4.8 meq/L (ref 3.5–5.3)
RBC: 3.32 M/uL — ABNORMAL LOW (ref 4.22–5.81)
RDW: 19.1 % — ABNORMAL HIGH (ref 11.5–15.5)
Sodium: 145 meq/L (ref 135–145)
Total Protein: 7.3 g/dL (ref 6.0–8.3)

## 2009-10-09 ENCOUNTER — Telehealth (INDEPENDENT_AMBULATORY_CARE_PROVIDER_SITE_OTHER): Payer: Self-pay | Admitting: Internal Medicine

## 2009-10-17 ENCOUNTER — Ambulatory Visit: Payer: Self-pay | Admitting: Internal Medicine

## 2009-10-24 ENCOUNTER — Telehealth (INDEPENDENT_AMBULATORY_CARE_PROVIDER_SITE_OTHER): Payer: Self-pay | Admitting: *Deleted

## 2009-10-29 ENCOUNTER — Ambulatory Visit (HOSPITAL_COMMUNITY): Admission: RE | Admit: 2009-10-29 | Discharge: 2009-10-29 | Payer: Self-pay | Admitting: Nephrology

## 2009-10-30 ENCOUNTER — Encounter (INDEPENDENT_AMBULATORY_CARE_PROVIDER_SITE_OTHER): Payer: Self-pay | Admitting: *Deleted

## 2009-11-08 ENCOUNTER — Ambulatory Visit: Payer: Self-pay | Admitting: Internal Medicine

## 2009-11-14 ENCOUNTER — Encounter: Payer: Self-pay | Admitting: Internal Medicine

## 2009-11-15 ENCOUNTER — Ambulatory Visit: Payer: Self-pay | Admitting: Vascular Surgery

## 2009-11-15 ENCOUNTER — Encounter (INDEPENDENT_AMBULATORY_CARE_PROVIDER_SITE_OTHER): Payer: Self-pay | Admitting: *Deleted

## 2009-11-27 ENCOUNTER — Telehealth (INDEPENDENT_AMBULATORY_CARE_PROVIDER_SITE_OTHER): Payer: Self-pay | Admitting: *Deleted

## 2009-12-26 ENCOUNTER — Ambulatory Visit: Payer: Self-pay | Admitting: Internal Medicine

## 2009-12-26 LAB — CONVERTED CEMR LAB
ALT: 25 units/L (ref 0–53)
AST: 32 units/L (ref 0–37)
Albumin: 3.7 g/dL (ref 3.5–5.2)
Basophils Absolute: 0 10*3/uL (ref 0.0–0.1)
Calcium: 9.6 mg/dL (ref 8.4–10.5)
Chloride: 102 meq/L (ref 96–112)
Cholesterol: 161 mg/dL (ref 0–200)
Eosinophils Relative: 8 % — ABNORMAL HIGH (ref 0–5)
HIV-1 RNA Quant, Log: 4.89 — ABNORMAL HIGH (ref <1.68)
LDL Cholesterol: 81 mg/dL (ref 0–99)
Lymphocytes Relative: 34 % (ref 12–46)
Neutro Abs: 1.1 10*3/uL — ABNORMAL LOW (ref 1.7–7.7)
Platelets: 138 10*3/uL — ABNORMAL LOW (ref 150–400)
Potassium: 4.5 meq/L (ref 3.5–5.3)
RDW: 21.6 % — ABNORMAL HIGH (ref 11.5–15.5)
Total Protein: 8.3 g/dL (ref 6.0–8.3)
Triglycerides: 200 mg/dL — ABNORMAL HIGH (ref <150)

## 2009-12-28 ENCOUNTER — Ambulatory Visit: Payer: Self-pay

## 2010-01-09 ENCOUNTER — Encounter: Payer: Self-pay | Admitting: Internal Medicine

## 2010-01-16 ENCOUNTER — Ambulatory Visit: Payer: Self-pay | Admitting: Internal Medicine

## 2010-01-16 LAB — CONVERTED CEMR LAB: HIV 1 RNA Quant: 142000 copies/mL — ABNORMAL HIGH (ref <48)

## 2010-01-21 ENCOUNTER — Encounter: Payer: Self-pay | Admitting: Internal Medicine

## 2010-02-08 ENCOUNTER — Encounter (INDEPENDENT_AMBULATORY_CARE_PROVIDER_SITE_OTHER): Payer: Self-pay | Admitting: *Deleted

## 2010-02-15 ENCOUNTER — Ambulatory Visit (HOSPITAL_COMMUNITY): Admission: RE | Admit: 2010-02-15 | Discharge: 2010-02-15 | Payer: Self-pay | Admitting: Nephrology

## 2010-03-04 ENCOUNTER — Encounter: Payer: Self-pay | Admitting: Internal Medicine

## 2010-03-20 ENCOUNTER — Encounter: Admission: RE | Admit: 2010-03-20 | Discharge: 2010-03-20 | Payer: Self-pay | Admitting: Nephrology

## 2010-04-04 ENCOUNTER — Ambulatory Visit: Payer: Self-pay | Admitting: Internal Medicine

## 2010-04-04 LAB — CONVERTED CEMR LAB: HIV-1 RNA Quant, Log: 5.81 — ABNORMAL HIGH (ref <1.68)

## 2010-04-08 ENCOUNTER — Encounter: Payer: Self-pay | Admitting: Internal Medicine

## 2010-04-12 ENCOUNTER — Encounter (INDEPENDENT_AMBULATORY_CARE_PROVIDER_SITE_OTHER): Payer: Self-pay | Admitting: *Deleted

## 2010-04-22 ENCOUNTER — Telehealth: Payer: Self-pay | Admitting: Internal Medicine

## 2010-04-26 ENCOUNTER — Encounter (INDEPENDENT_AMBULATORY_CARE_PROVIDER_SITE_OTHER): Payer: Self-pay | Admitting: *Deleted

## 2010-05-14 ENCOUNTER — Ambulatory Visit: Payer: Self-pay | Admitting: Internal Medicine

## 2010-05-14 LAB — CONVERTED CEMR LAB
HIV 1 RNA Quant: 2490 copies/mL — ABNORMAL HIGH (ref <48)
HIV-1 RNA Quant, Log: 3.4 — ABNORMAL HIGH (ref <1.68)

## 2010-05-15 ENCOUNTER — Ambulatory Visit (HOSPITAL_COMMUNITY): Admission: RE | Admit: 2010-05-15 | Discharge: 2010-05-15 | Payer: Self-pay | Admitting: Nephrology

## 2010-05-15 LAB — CONVERTED CEMR LAB
AST: 61 units/L — ABNORMAL HIGH (ref 0–37)
Albumin: 4.3 g/dL (ref 3.5–5.2)
BUN: 33 mg/dL — ABNORMAL HIGH (ref 6–23)
Basophils Relative: 1 % (ref 0–1)
Calcium: 10.5 mg/dL (ref 8.4–10.5)
Chloride: 96 meq/L (ref 96–112)
Creatinine, Ser: 8.99 mg/dL — ABNORMAL HIGH (ref 0.40–1.50)
Glucose, Bld: 85 mg/dL (ref 70–99)
Hemoglobin: 13.8 g/dL (ref 13.0–17.0)
Lymphs Abs: 1.2 10*3/uL (ref 0.7–4.0)
MCHC: 33.4 g/dL (ref 30.0–36.0)
MCV: 97.4 fL (ref 78.0–100.0)
Monocytes Absolute: 0.8 10*3/uL (ref 0.1–1.0)
Monocytes Relative: 15 % — ABNORMAL HIGH (ref 3–12)
Neutro Abs: 3.2 10*3/uL (ref 1.7–7.7)
Neutrophils Relative %: 58 % (ref 43–77)
Potassium: 3.9 meq/L (ref 3.5–5.3)
RBC: 4.24 M/uL (ref 4.22–5.81)
WBC: 5.5 10*3/uL (ref 4.0–10.5)

## 2010-05-18 ENCOUNTER — Emergency Department (HOSPITAL_COMMUNITY): Admission: EM | Admit: 2010-05-18 | Discharge: 2010-05-19 | Payer: Self-pay | Admitting: Emergency Medicine

## 2010-05-19 ENCOUNTER — Encounter: Payer: Self-pay | Admitting: Internal Medicine

## 2010-05-30 DEATH — deceased

## 2010-05-31 ENCOUNTER — Ambulatory Visit: Payer: Self-pay | Admitting: Vascular Surgery

## 2010-06-04 ENCOUNTER — Ambulatory Visit (HOSPITAL_COMMUNITY): Admission: RE | Admit: 2010-06-04 | Discharge: 2010-06-04 | Payer: Self-pay | Admitting: Vascular Surgery

## 2010-06-17 ENCOUNTER — Ambulatory Visit: Payer: Self-pay | Admitting: Internal Medicine

## 2010-06-17 LAB — CONVERTED CEMR LAB
HIV 1 RNA Quant: 164 copies/mL — ABNORMAL HIGH (ref <20)
HIV-1 RNA Quant, Log: 2.21 — ABNORMAL HIGH (ref <1.30)

## 2010-07-17 ENCOUNTER — Encounter: Payer: Self-pay | Admitting: Internal Medicine

## 2010-08-19 ENCOUNTER — Emergency Department (HOSPITAL_COMMUNITY): Admission: EM | Admit: 2010-08-19 | Discharge: 2010-08-19 | Payer: Self-pay | Admitting: Emergency Medicine

## 2010-08-19 ENCOUNTER — Encounter (INDEPENDENT_AMBULATORY_CARE_PROVIDER_SITE_OTHER): Payer: Self-pay | Admitting: *Deleted

## 2010-08-20 ENCOUNTER — Ambulatory Visit: Payer: Self-pay | Admitting: Internal Medicine

## 2010-08-20 DIAGNOSIS — M25569 Pain in unspecified knee: Secondary | ICD-10-CM

## 2010-08-30 ENCOUNTER — Encounter: Payer: Self-pay | Admitting: Internal Medicine

## 2010-09-07 ENCOUNTER — Emergency Department (HOSPITAL_COMMUNITY)
Admission: EM | Admit: 2010-09-07 | Discharge: 2010-09-08 | Payer: Self-pay | Source: Home / Self Care | Admitting: Emergency Medicine

## 2010-09-08 ENCOUNTER — Emergency Department (HOSPITAL_COMMUNITY)
Admission: EM | Admit: 2010-09-08 | Discharge: 2010-09-08 | Payer: Self-pay | Source: Home / Self Care | Admitting: Emergency Medicine

## 2010-09-08 ENCOUNTER — Encounter (INDEPENDENT_AMBULATORY_CARE_PROVIDER_SITE_OTHER): Payer: Self-pay | Admitting: Emergency Medicine

## 2010-09-08 ENCOUNTER — Ambulatory Visit (HOSPITAL_COMMUNITY)
Admission: EM | Admit: 2010-09-08 | Discharge: 2010-09-08 | Payer: Self-pay | Source: Home / Self Care | Attending: Internal Medicine | Admitting: Internal Medicine

## 2010-10-19 ENCOUNTER — Encounter: Payer: Self-pay | Admitting: Nephrology

## 2010-10-20 ENCOUNTER — Encounter: Payer: Self-pay | Admitting: Nephrology

## 2010-10-21 ENCOUNTER — Encounter: Payer: Self-pay | Admitting: Vascular Surgery

## 2010-10-29 NOTE — Consult Note (Signed)
Summary: [facility]  [facility]   Imported By: Florinda Marker 03/27/2010 15:51:21  _____________________________________________________________________  External Attachment:    Type:   Image     Comment:   External Document

## 2010-10-29 NOTE — Progress Notes (Signed)
  Phone Note Other Incoming   Summary of Call: I got a call from [facility], platelet 42,000. His last platelet on the system is  ~60K, I will let Dr. Orvan Falconer about it.

## 2010-10-29 NOTE — Assessment & Plan Note (Signed)
Summary: fukam   Primary Provider:  Terrial Rhodes, MD  CC:  follow-up visit.  History of Present Illness: [name] is in for his routine visit.  He thinks that he started his anti-retroviral medications about two weeks after his last visit in mid February.  He says he is using his pillbox and filling it out every "Sunday.  He does not have his medication or pillbox with him today. He thinks that he is only missed one dose of medication since he started taking it.  He says that he tolerates it well.  He has trouble picking up the medications on the chart and does not know their names or how to take them.  However, he says he simply reads each bottle on Sunday as he feels that his pillbox and doesn't bother to memorize their names or how he takes them.  He says he is feeling much better with considerably more energy since he started taking the medication.  Preventive Screening-Counseling & Management  Alcohol-Tobacco     Alcohol drinks/day: 0     Smoking Status: current     Smoking Cessation Counseling: yes     Packs/Day: <0.25     Year Quit: 2008  Caffeine-Diet-Exercise     Caffeine use/day: tea and sodas     Does Patient Exercise: no     Type of exercise: walk     Times/week: <3  Safety-Violence-Falls     Seat Belt Use: yes   Updated Prior Medication List: EPIVIR 10 MG/ML SOLN (LAMIVUDINE) 5 ml by mouth every evening VIREAD 300 MG TABS (TENOFOVIR DISOPROXIL FUMARATE) Take 1 tablet by mouth once a week REYATAZ 300 MG CAPS (ATAZANAVIR SULFATE) Take 1 tablet by mouth once a day NORVIR 100 MG TABS (RITONAVIR) Take 1 tablet by mouth once a day SEPTRA DS 800-160 MG TABS (SULFAMETHOXAZOLE-TRIMETHOPRIM) Take 1 tablet by mouth every Monday, Wednesday, and Friday DIFLUCAN 100 MG TABS (FLUCONAZOLE) Take 1 tablet by mouth once a week ZITHROMAX 600 MG TABS (AZITHROMYCIN) Take 2 tablets by mouth once a week VALTREX 500 MG TABS (VALACYCLOVIR HCL) Take 1 tablet by mouth once a day KEFLEX 500  MG CAPS (CEPHALEXIN) Take 1 capsule by mouth once a day  Current Allergies (reviewed today): ! PCN ! PERCOCET ! * TYLENOL #3 Vital Signs:  Patient profile:   45 year old male Height:      71 inches (180.34 cm) Weight:      137.4 pounds (62.45 kg) Temp:     98" .2 degrees F (36.78 degrees C) oral  Vitals Entered By: Kathi Simpers [facility]) (January 16, 2010 3:10 PM) CC: follow-up visit Pain Assessment Patient in pain? no      Nutritional Status Detail appetite is picking up per patient  Does patient need assistance? Functional Status Self care Ambulation Normal   Physical Exam  General:  alert and well-nourished.  he looks better and is in better spirits. Mouth:  pharynx pink and moist, no erythema, no exudates, and fair dentition.   Lungs:  normal respiratory effort, normal breath sounds, no crackles, and no wheezes.   Heart:  normal rate, regular rhythm, and no murmur.   Extremities:  right arm graft appears normal Skin:  facial seborrhea has improved. Psych:  normally interactive, good eye contact, not anxious appearing, and not depressed appearing.          Medication Adherence: 01/16/2010   Adherence to medications reviewed with patient. Counseling to provide adequate adherence provided  Impression & Recommendations:  Problem # 1:  HIV DISEASE (ICD-042) His viral load is down to 77,500 from over 2 million after only one month of treatment.  His CD4 count is up from 30 to 80.  I will repeat his viral load today to see if it is acceptably suppressed.  If not I will obtain a genotype resistance test.  I have encouraged Trayon to continue taking his medication and try not to miss a single dose.  I have asked him to bring his medication and pillbox and for each visit.  He seems to be doing better. The following medications were removed from the medication list:    Cephalexin 500 Mg Caps (Cephalexin) .Marland Kitchen... Take 1 capsule by mouth two  times a day His updated medication list for this problem includes:    Septra Ds 800-160 Mg Tabs (Sulfamethoxazole-trimethoprim) .Marland Kitchen... Take 1 tablet by mouth every monday, wednesday, and friday    Diflucan 100 Mg Tabs (Fluconazole) .Marland Kitchen... Take 1 tablet by mouth once a week    Zithromax 600 Mg Tabs (Azithromycin) .Marland Kitchen... Take 2 tablets by mouth once a week    Valtrex 500 Mg Tabs (Valacyclovir hcl) .Marland Kitchen... Take 1 tablet by mouth once a day    Keflex 500 Mg Caps (Cephalexin) .Marland Kitchen... 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 Take 1 capsule by mouth once a day  Other Orders: T-CD4SP ([facility]) (CD4SP) T-HIV Viral Load [phone]) Est. Patient Level III (29562)  Patient Instructions: 1)  Please schedule a follow-up appointment in 6 weeks.  Process Orders Check Orders Results:     [facility]: Check successful Tests Sent for requisitioning (January 16, 2010 3:44 PM):     01/16/2010: [facility] -- T-HIV Viral Load [phone] (signed)

## 2010-10-29 NOTE — Progress Notes (Signed)
Summary: Pt. having difficulty paying for rxes  Phone Note Call from Patient Call back at [facility] Phone [phone]   Caller: Patient Reason for Call: Insurance Question Summary of Call: Pt. left message.  Unclear about how to pay for his medications.  Stated that he has applied for Medicaid and that he does not know when he will be approved.  Wondering how he is going to pay for his current medications.  Does not receive any $ until the first of February.  RN will send this message to M. Dupree to call pt. re: this problem.  Pt. spoke with M. Dupree at this last OV. Jennet Maduro RN  October 24, 2009 9:40 AM

## 2010-10-29 NOTE — Miscellaneous (Signed)
Summary: clinical update/ryan white NCADAP approved  Clinical Lists Changes  Observations: Added new observation of AIDSDAP: Yes 2011 (11/15/2009 10:02)

## 2010-10-29 NOTE — Miscellaneous (Signed)
Summary: [facility]  Clinical Lists Changes [facility] transported pt to [facility] today to apply for food stamps. Pt did take his am medication while [facility] was there today. Pt reports that Bjorn Loser from [facility] came to see him Wednesday to help with Tx adherence. [facility] will close pt's file now that he is working with [facility].  Selena Batten [facility]  April 12, 2010 1:41 PM

## 2010-10-29 NOTE — Miscellaneous (Signed)
Summary: [facility]  [facility]   Imported By: Florinda Marker 10/18/2009 14:11:05  _____________________________________________________________________  External Attachment:    Type:   Image     Comment:   External Document

## 2010-10-29 NOTE — Progress Notes (Signed)
Summary: [facility] not taking new pts  Phone Note Other Incoming   Caller: Cleophus Molt, RN, [facility] Summary of Call: [facility] no longer taking new pts. Jennet Maduro RN  November 27, 2009 12:39 PM

## 2010-10-29 NOTE — Assessment & Plan Note (Signed)
Summary: 2WK F/U/VS   Primary Provider:  Terrial Rhodes, MD  CC:  2 week f/u.  History of Present Illness: [name] is in for his routine visit.  He says he has not been on any of his medications because he could not afford them.  He has been taking cephalexin 500 mg twice daily for the last week for an infection of his right index finger.  He started having pain and swelling around his fingernail about 3 weeks ago.  He was initially started on doxycycline but had no improvement.  He says he is doing better since he started the cephalexin.  Fortunately he received a letter in yesterday saying his Medicaid was approved.  He lives alone in an apartment on [address] by himself.  His girlfriend is they are frequently though.  He does drive himself to dialysis every Tuesday Thursday and Saturday and two other appointments.  He has not been to [facility] in many years but is willing to return.  Preventive Screening-Counseling & Management  Alcohol-Tobacco     Alcohol drinks/day: 0     Smoking Status: current     Smoking Cessation Counseling: yes     Packs/Day: 1/2 ppd     Year Quit: 2008  Caffeine-Diet-Exercise     Caffeine use/day: YES     Does Patient Exercise: no     Type of exercise: walk     Times/week: 2   Updated Prior Medication List: SEPTRA DS 800-160 MG TABS (SULFAMETHOXAZOLE-TRIMETHOPRIM) Take 1 tablet by mouth every Monday, Wednesday, and Friday DIFLUCAN 100 MG TABS (FLUCONAZOLE) Take 1 tablet by mouth once a week ZITHROMAX 600 MG TABS (AZITHROMYCIN) Take 2 tablets by mouth once a week VALTREX 500 MG TABS (VALACYCLOVIR HCL) Take 1 tablet by mouth once a day CEPHALEXIN 500 MG CAPS (CEPHALEXIN) Take 1 capsule by mouth two times a day  Current Allergies (reviewed today): ! PCN ! PERCOCET ! * TYLENOL #3 Vital Signs:  Patient profile:   45 year old male Height:      71 inches (180.34 cm) Weight:      140.44 pounds (63.84 kg) BMI:     19.66 Temp:     97.9 degrees  F (36.61 degrees C) oral Pulse rate:   89 / minute BP sitting:   140 / 91  (left arm)  Vitals Entered By: Starleen Arms CMA (November 08, 2009 11:14 AM) CC: 2 week f/u Is Patient Diabetic? No Pain Assessment Patient in pain? yes     Location: rt index finger Intensity: 6 Type: aching Onset of pain  Constant Nutritional Status BMI of 19 -24 = normal  Does patient need assistance? Functional Status Self care Ambulation Normal   Physical Exam  General:  alert and underweight appearing.   Mouth:  pharynx pink and moist, no erythema, no exudates, and fair dentition.   Lungs:  normal respiratory effort, normal breath sounds, no crackles, and no wheezes.   Heart:  normal rate, regular rhythm, and no murmur.   Skin:  facial seborrhea Psych:  normally interactive, good eye contact, and not anxious appearing.  moderately despondent.    Impression & Recommendations:  Problem # 1:  HIV DISEASE (ICD-042) Minard seems to realize the dire situation he is in.  He seems to be more motivated to change then he has been in the last decade.  Fortunately he has been approved for Medicaid so he should not have the same financial hurdles.  I have given him  new prescriptions today and asked him to go to [facility] adherence [facility].  He has a pillbox that he will take with him.  He knows to call me if he has any problems between now and his next visit. His updated medication list for this problem includes:    Septra Ds 800-160 Mg Tabs (Sulfamethoxazole-trimethoprim) .Marland Kitchen... Take 1 tablet by mouth every monday, wednesday, and friday    Diflucan 100 Mg Tabs (Fluconazole) .Marland Kitchen... Take 1 tablet by mouth once a week    Zithromax 600 Mg Tabs (Azithromycin) .Marland Kitchen... Take 2 tablets by mouth once a week    Valtrex 500 Mg Tabs (Valacyclovir hcl) .Marland Kitchen... Take 1 tablet by mouth once a day    Cephalexin 500 Mg Caps (Cephalexin) .Marland Kitchen... 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 5 ml by mouth every evening 2)  Viread 300 Mg Tabs (Tenofovir disoproxil fumarate) .... Take 1 tablet by mouth once a week 3)  Reyataz 300 Mg Caps (Atazanavir sulfate) .... Take 1 tablet by mouth once a day 4)  Norvir 100 Mg Tabs (Ritonavir) .... Take 1 tablet by mouth once a day 5)  Cephalexin 500 Mg Caps (Cephalexin) .... Take 1 capsule by mouth two times a day  Other Orders: Est. Patient Level III (52841) Future Orders: T-CD4SP ([facility]) (CD4SP) ... 12/20/2009 T-HIV Viral Load [phone]) ... 12/20/2009 T-Comprehensive Metabolic Panel [id number]) ... 12/20/2009 T-CBC w/Diff ([id number]) ... 12/20/2009 T-RPR (Syphilis) [id number]) ... 12/20/2009 T-Lipid Profile [id number]) ... 12/20/2009  Patient Instructions: 1)  Please schedule a follow-up appointment in 2 months.  Prescriptions: VALTREX 500 MG TABS (VALACYCLOVIR HCL) Take 1 tablet by mouth once a day  #5 x 11   Entered and Authorized by:   Cliffton [name] MD   Signed by:   Cliffton [name] MD on 11/08/2009   Method used:   Print then Give to Patient   RxID:   [id number] ZITHROMAX 600 MG TABS (AZITHROMYCIN) Take 2 tablets by mouth once a week  #8 x 11   Entered and Authorized by:   Cliffton [name] MD   Signed by:   Cliffton [name] MD on 11/08/2009   Method used:   Print then Give to Patient   RxID:   [id number] DIFLUCAN 100 MG TABS (FLUCONAZOLE) Take 1 tablet by mouth once a week  #4 x 11   Entered and Authorized by:   Cliffton [name] MD   Signed by:   Cliffton [name] MD on 11/08/2009   Method used:   Print then Give to Patient   RxID:   [id number] IOEVOJ DS 800-160 MG  TABS (SULFAMETHOXAZOLE-TRIMETHOPRIM) Take 1 tablet by mouth every Monday, Wednesday, and Friday  #12 x 11   Entered and Authorized by:   Cliffton [name] MD   Signed by:   Cliffton [name] MD on 11/08/2009   Method used:   Print then Give to Patient   RxID:   [id number] NORVIR 100 MG TABS (RITONAVIR) Take 1 tablet by mouth once a day  #30 x 11   Entered and Authorized by:   Cliffton [name] MD   Signed  by:   Cliffton [name] MD on 11/08/2009   Method used:   Print then Give to Patient   RxID:   [id number] REYATAZ 300 MG CAPS (ATAZANAVIR SULFATE) Take 1 tablet by mouth once a day  #30 x 11   Entered and Authorized by:   Cliffton [name] MD   Signed by:   Cliffton [name] MD on 11/08/2009   Method used:   Print then Give to Patient   RxID:   [id number] VIREAD 300 MG TABS (TENOFOVIR DISOPROXIL FUMARATE) Take 1 tablet by mouth once a week  #4 x 11   Entered and Authorized by:   Cliffton [name] MD   Signed by:   Cliffton [name] MD on 11/08/2009   Method used:   Print then Give to Patient   RxID:   [id number] EPIVIR 10 MG/ML SOLN (LAMIVUDINE) 5 ml by mouth every evening  #1 month x 11   Entered and Authorized by:   Cliffton [name] MD   Signed by:   Cliffton [name] MD on 11/08/2009   Method used:   Print then Give to Patient   RxID:   [id number]

## 2010-10-29 NOTE — Miscellaneous (Signed)
  Clinical Lists Changes  Observations: Added new observation of YEARAIDSPOS: 2000  (08/19/2010 10:42)

## 2010-10-29 NOTE — Miscellaneous (Signed)
   Allergies: 1)  ! Pcn 2)  ! Percocet 3)  ! * Tylenol #3   Impression & Recommendations:  Problem # 1:  HIV DISEASE (ICD-042)  His updated medication list for this problem includes:    Septra Ds 800-160 Mg Tabs (Sulfamethoxazole-trimethoprim) .Marland Kitchen... Take 1 tablet by mouth every monday, wednesday, and friday    Diflucan 100 Mg Tabs (Fluconazole) .Marland Kitchen... Take 1 tablet by mouth once a week    Zithromax 600 Mg Tabs (Azithromycin) .Marland Kitchen... Take 2 tablets by mouth once a week    Valtrex 500 Mg Tabs (Valacyclovir hcl) .Marland Kitchen... Take 1 tablet by mouth once a day    Keflex 500 Mg Caps (Cephalexin) .Marland Kitchen... Take 1 capsule by mouth once a day  Orders: T-HIV Genotype ([id number])

## 2010-10-29 NOTE — Assessment & Plan Note (Signed)
Summary: 6WK F/U/VS   Primary Provider:  Terrial Rhodes, MD  CC:  follow-up visit.  History of Present Illness: [name] is in for his routine visit.  He has been working with [name] the treatment appearance counselor for the last month.  She comes to visit him every Wednesday and he fills out his pillbox every Sunday.  He says he does not believe he is missed any doses of his medication in the past month.  He has his pillbox with him and it appears to be filled out correctly.  Preventive Screening-Counseling & Management  Alcohol-Tobacco     Alcohol drinks/day: 0     Smoking Status: current     Smoking Cessation Counseling: yes     Smoke Cessation Stage: contemplative     Packs/Day: 0.5     Year Quit: 2008  Caffeine-Diet-Exercise     Caffeine use/day: tea     Does Patient Exercise: occassionally     Type of exercise: walk     Times/week: <3  Hep-HIV-STD-Contraception     HIV Risk: no risk noted     HIV Risk Counseling: 12/16/2005  Safety-Violence-Falls     Seat Belt Use: yes  Comments: declined condoms      Sexual History:  currently monogamous.        Drug Use:  former and marijuana.     Prior Medication List:  EPIVIR 10 MG/ML SOLN (LAMIVUDINE) 5 ml by mouth every evening VIREAD 300 MG TABS (TENOFOVIR DISOPROXIL FUMARATE) Take 1 tablet by mouth once a week REYATAZ 300 MG CAPS (ATAZANAVIR SULFATE) Take 1 tablet by mouth once a day NORVIR 100 MG TABS (RITONAVIR) Take 1 tablet by mouth once a day SEPTRA DS 800-160 MG TABS (SULFAMETHOXAZOLE-TRIMETHOPRIM) Take 1 tablet by mouth every Monday, Wednesday, and Friday DIFLUCAN 100 MG TABS (FLUCONAZOLE) Take 1 tablet by mouth once a week ZITHROMAX 600 MG TABS (AZITHROMYCIN) Take 2 tablets by mouth once a week VALTREX 500 MG TABS (VALACYCLOVIR HCL) Take 1 tablet by mouth once a day   Current Allergies (reviewed today): ! PCN ! PERCOCET ! * TYLENOL #3 Social History: Drug Use:  former, marijuana  Vital  Signs:  Patient profile:   44 year old male Height:      71 inches (180.34 cm) Weight:      143.0 pounds (65 kg) BMI:     20.02 Temp:     98 .1 degrees F (36.72 degrees C) oral Cuff size:   large  Vitals Entered By: Jennet Maduro RN (May 14, 2010 11:32 AM) CC: follow-up visit Is Patient Diabetic? No Pain Assessment Patient in pain? yes     Location: [place] Intensity: 2 Type: burning Nutritional Status BMI of 19 -24 = normal Nutritional Status Detail appetite "pretty good normally"  Have you ever been in a relationship where you felt threatened, hurt or afraid?No   Does patient need assistance? Functional Status Self care Ambulation Normal Comments no missed doses, unable to obtain b/p in right lower leg.  pt. just came from dialysis.   Physical Exam  General:  alert and well-nourished.   Mouth:  pharynx pink and moist, no erythema, no exudates, and fair dentition.   Lungs:  normal respiratory effort, normal breath sounds, no crackles, and no wheezes.   Heart:  normal rate, regular rhythm, and no murmur.   Psych:  normally interactive, good eye contact, not anxious appearing, and not depressed appearing.      Impression & Recommendations:  Problem # 1:  HIV  DISEASE (ICD-042) It appears that his adherence has improved.  I will check his lab work today. His updated medication list for this problem includes:    Septra Ds 800-160 Mg Tabs (Sulfamethoxazole-trimethoprim) .Marland Kitchen... Take 1 tablet by mouth every monday, wednesday, and friday    Diflucan 100 Mg Tabs (Fluconazole) .Marland Kitchen... Take 1 tablet by mouth once a week    Zithromax 600 Mg Tabs (Azithromycin) .Marland Kitchen... Take 2 tablets by mouth once a week    Valtrex 500 Mg Tabs (Valacyclovir hcl) .Marland Kitchen... 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 Patient Level III (57846)  Patient Instructions: 1)  Please schedule a follow-up appointment in 1 month.   Prevention & Chronic Care Immunizations   Influenza vaccine: Historical  (06/30/2007)    Tetanus booster: Not documented    Pneumococcal vaccine: Pneumovax  (09/14/2007)  Other Screening   Smoking status: current  (05/14/2010)   Smoking cessation counseling: yes  (05/14/2010)  Lipids   Total Cholesterol: 161  (12/26/2009)   LDL: 81  (12/26/2009)   LDL Direct: Not documented   HDL: 40  (12/26/2009)   Triglycerides: 200  (12/26/2009)

## 2010-10-29 NOTE — Assessment & Plan Note (Signed)
Summary: 45month f/u [mkj]   Primary Provider:  Terrial Rhodes, MD  CC:  follow-up visit and fell down due to rising too fast and hurt his ankle.  History of Present Illness: [name] is in for his routine visit.  He says he is feeling much better and that he believes the reason for that are his HIV medications. He continues to meet with his adherence counselor and is using his pillbox.  He does not believe he has missed any medications since his last visit.  He says he has not been sexually active recently.  He is planning on getting his influenza vaccine and a pneumococcal booster had dialysis next week.  Preventive Screening-Counseling & Management  Alcohol-Tobacco     Alcohol drinks/day: 0     Smoking Status: current     Smoking Cessation Counseling: yes     Smoke Cessation Stage: contemplative     Packs/Day: 0.5     Year Quit: 2008  Caffeine-Diet-Exercise     Caffeine use/day: tea     Does Patient Exercise: occassionally     Type of exercise: walk     Times/week: <3  Hep-HIV-STD-Contraception     HIV Risk: no risk noted     HIV Risk Counseling: not indicated-no HIV risk noted  Safety-Violence-Falls     Seat Belt Use: yes  Comments: declined condoms   Prior Medication List:  EPIVIR 10 MG/ML SOLN (LAMIVUDINE) 5 ml by mouth every evening VIREAD 300 MG TABS (TENOFOVIR DISOPROXIL FUMARATE) Take 1 tablet by mouth once a week REYATAZ 300 MG CAPS (ATAZANAVIR SULFATE) Take 1 tablet by mouth once a day NORVIR 100 MG TABS (RITONAVIR) Take 1 tablet by mouth once a day SEPTRA DS 800-160 MG TABS (SULFAMETHOXAZOLE-TRIMETHOPRIM) Take 1 tablet by mouth every Monday, Wednesday, and Friday DIFLUCAN 100 MG TABS (FLUCONAZOLE) Take 1 tablet by mouth once a week ZITHROMAX 600 MG TABS (AZITHROMYCIN) Take 2 tablets by mouth once a week VALTREX 500 MG TABS (VALACYCLOVIR HCL) Take 1 tablet by mouth once a day VICODIN 5-500 MG TABS (HYDROCODONE-ACETAMINOPHEN) take 1 tab by mouth q6 hours as  needed for pain   Current Allergies (reviewed today): ! PCN ! PERCOCET ! * TYLENOL #3 Vital Signs:  Patient profile:   45 year old male Height:      71 inches (180.34 cm) Weight:      145.75 pounds (66.25 kg) BMI:     20.40 Temp:     97.5 degrees F (36.39 degrees C) oral Pulse rate:   75 / minute BP sitting:   170 / 98  (left arm) Cuff size:   regular  Vitals Entered By: Jennet Maduro RN (June 17, 2010 3:00 PM) CC: follow-up visit, fell down due to rising too fast and hurt his ankle Is Patient Diabetic? No Pain Assessment Patient in pain? no      Nutritional Status BMI of 19 -24 = normal Nutritional Status Detail appetite "good"  Have you ever been in a relationship where you felt threatened, hurt or afraid?not asked today   Does patient need assistance? Functional Status Self care Ambulation Normal Comments no missed doses of rxes, RN from HCP checking on him on Wednesdays   Physical Exam  General:  alert and well-nourished.  he is looking better. Mouth:  pharynx pink and moist, no erythema, no exudates, and fair dentition.   Lungs:  normal respiratory effort, normal breath sounds, no crackles, and no wheezes.   Heart:  normal rate, regular rhythm, and no  murmur.   Psych:  normally interactive, good eye contact, not anxious appearing, and not depressed appearing.          Medication Adherence: 06/17/2010   Adherence to medications reviewed with patient. Counseling to provide adequate adherence provided   Prevention For Positives: 06/17/2010   Safe sex practices discussed with patient. Condoms offered.                             Impression & Recommendations:  Problem # 1:  HIV DISEASE (ICD-042) [name] is improving.  His viral load is down from slightly over 2 million in January to just over 2000.  I will repeat his levels today.  His adherence has improved and he seems motivated to continue his medications. His updated medication list for this  problem includes:    Septra Ds 800-160 Mg Tabs (Sulfamethoxazole-trimethoprim) .Marland Kitchen... Take 1 tablet by mouth every monday, wednesday, and friday    Diflucan 100 Mg Tabs (Fluconazole) .Marland Kitchen... Take 1 tablet by mouth once a week    Zithromax 600 Mg Tabs (Azithromycin) .Marland Kitchen... Take 2 tablets by mouth once a week    Valtrex 500 Mg Tabs (Valacyclovir hcl) .Marland Kitchen... 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 Patient Level III (14782)  Patient Instructions: 1)  Please schedule a follow-up appointment in 6 weeks.

## 2010-10-29 NOTE — Assessment & Plan Note (Signed)
Summary: f/u /dde   Primary Provider:  Terrial Rhodes, MD  CC:  F/U VISIT, UNABLE TO OBTAIN b/p DUE TO BOTH ARMS HAS DIALYSIS ACCESS SITES, and Depression.  History of Present Illness: [name] is in for his first visit in two years.  He has not been taking any medication recently and is not even sure when he last took HIV medication.  He says he has been feeling so so.  He continues on hemodialysis every Tuesday, Thursday and Saturday.  He lives alone here in [place] but has a girlfriend.  She had been unaware of his HIV infection and fill one of his doctors mentioned HIV in front of her.  He says he was actually relieved to not have to continue to keep the secret.  He says she was surprised but continues to be supportive. They had been sexually active but has not had sexual intercourse since she found out.  A head use condoms sporadically.  She has performed oral sex on [place] on occasion recently without condoms.  He has been bothered by anorexia, intermittent diarrhea, and slightly depressed mood.  He says he came in today because he wants to feel better. He recently received his influenza vaccine at the hemodialysis center.  Depression History:      The patient denies a depressed mood most of the day and a diminished interest in his usual daily activities.        Preventive Screening-Counseling & Management  Alcohol-Tobacco     Alcohol drinks/day: 0     Smoking Status: current     Smoking Cessation Counseling: yes     Packs/Day: 1/2 ppd     Year Quit: 2008  Caffeine-Diet-Exercise     Caffeine use/day: YES     Does Patient Exercise: no  Hep-HIV-STD-Contraception     HIV Risk: no risk noted  Safety-Violence-Falls     Seat Belt Use: 100  Comments: DECLINED CONDOMS      Sexual History:  currently monogamous.        Drug Use:  never.     Updated Prior Medication List: No Medications Current Allergies (reviewed today): ! PCN ! PERCOCET ! * TYLENOL #3 Social  History: Sexual History:  currently monogamous Drug Use:  never  Vital Signs:  Patient profile:   45 year old male Height:      71 inches (180.34 cm) Weight:      135.5 pounds (61.59 kg) BMI:     18.97 Temp:     96.6 degrees F (35.89 degrees C) oral Cuff size:   regular  Vitals Entered By: Jennet Maduro RN (October 17, 2009 3:14 PM) CC: F/U VISIT, UNABLE TO OBTAIN b/p DUE TO BOTH ARMS HAS DIALYSIS ACCESS SITES, Depression Is Patient Diabetic? No Nutritional Status BMI of < 19 = underweight Nutritional Status Detail APPETITE "PRETTY GOOD"  Have you ever been in a relationship where you felt threatened, hurt or afraid?No   Does patient need assistance? Functional Status Self care Ambulation Normal Comments MISSED MANY DOSES OF HIV RXES   Physical Exam  General:  alert and underweight appearing.   Mouth:  pharynx pink and moist, no erythema, no exudates, and fair dentition.   Lungs:  normal respiratory effort, normal breath sounds, no crackles, and no wheezes.   Heart:  normal rate, regular rhythm, and no murmur.   Rectal:  he has an active outbreak of genital herpes Extremities:  right arm graft appears normal Skin:  facial seborrhea Axillary Nodes:  no  R axillary adenopathy and no L axillary adenopathy.   Psych:  normally interactive, good eye contact, not anxious appearing, and not depressed appearing.          Medication Adherence: 10/17/2009   Adherence to medications reviewed with patient. Counseling to provide adequate adherence provided   Prevention For Positives: 10/17/2009   Safe sex practices discussed with patient. Condoms offered.                             Impression & Recommendations:  Problem # 1:  HIV DISEASE (ICD-042) [name] is actually doing amazingly well given that he has had a very low CD4 count for years, is not on therapy, and has a viral load of over 2 million.  He has Medicare but is unsure if he has part the drug coverage.  I will  attempt to start him on basic prophylactic agents and Valtrex but will have him see Paulo Fruit today and ask her to work with his case manager to see if we can help him obtain medications.  I will also make referral to Cleophus Molt for adherence counseling and see him back in two weeks to consider starting a salvage regimen. The following medications were removed from the medication list:    Septra Ds 800-160 Mg Tabs (Sulfamethoxazole-trimethoprim) .Marland Kitchen... Take one tab by mouth monday, wednesday and friday.    First-bxn Mouthwash Susp (Diphenhyd-lidocaine-nystatin) .Marland KitchenMarland KitchenMarland KitchenMarland Kitchen 10 ml swish and swallow qid for 10 days His updated medication list for this problem includes:    Septra Ds 800-160 Mg Tabs (Sulfamethoxazole-trimethoprim) .Marland Kitchen... Take 1 tablet by mouth every monday, wednesday, and friday    Diflucan 100 Mg Tabs (Fluconazole) .Marland Kitchen... Take 1 tablet by mouth once a week    Zithromax 600 Mg Tabs (Azithromycin) .Marland Kitchen... Take 2 tablets by mouth once a week    Valtrex 500 Mg Tabs (Valacyclovir hcl) .Marland Kitchen... 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 Take 1 tablet by mouth every monday, wednesday, and friday 2)  Diflucan 100 Mg Tabs (Fluconazole) .... Take 1 tablet by mouth once a week 3)  Zithromax 600 Mg Tabs (Azithromycin) .... Take 2 tablets by mouth once a week 4)  Valtrex 500 Mg Tabs  (Valacyclovir hcl) .... Take 1 tablet by mouth once a day  Other Orders: Est. Patient Level IV (72536)  Patient Instructions: 1)  Please schedule a follow-up appointment in 2 weeks.  Prescriptions: VALTREX 500 MG TABS (VALACYCLOVIR HCL) Take 1 tablet by mouth once a day  #5 x 11   Entered and Authorized by:   Cliffton [name] MD   Signed by:   Cliffton [name] MD on 10/17/2009   Method used:   Print then Give to Patient   RxID:   [id number] ZITHROMAX 600 MG TABS (AZITHROMYCIN) Take 2 tablets by mouth once a week  #  8 x 11   Entered and Authorized by:   Cliffton [name] MD   Signed by:   Cliffton [name] MD on 10/17/2009   Method used:   Print then Give to Patient   RxID:   [id number] DIFLUCAN 100 MG TABS (FLUCONAZOLE) Take 1 tablet by mouth once a week  #4 x 11   Entered and Authorized by:   Cliffton [name] MD   Signed by:   Cliffton [name] MD on 10/17/2009   Method used:   Print then Give to Patient   RxID:   [id number] UXLKGM DS 800-160 MG TABS (SULFAMETHOXAZOLE-TRIMETHOPRIM) Take 1 tablet by mouth every Monday, Wednesday, and Friday  #12 x 11   Entered and Authorized by:   Cliffton [name] MD   Signed by:   Cliffton [name] MD on 10/17/2009   Method used:   Print then Give to Patient   RxID:   [id number]     Appended Document: f/u /dde   Depression History:      The patient denies a depressed mood most of the day and a diminished interest in his usual daily activities.

## 2010-10-29 NOTE — Miscellaneous (Signed)
  Clinical Lists Changes Patient was evaluated at [facility] ED for right foot contusion sustained during a fall on 05/18/10. States that he had a session of dialysis on 8/20 that was unremarkable, after which he walked outside in the heat. Upon arriving home he felt lightheaded on standing, which then led to a fall in which he thinks he may have twisted his right foot. With concern of fracture and persistent pain of 8/10, patient presented to [facility] ED, where he was found to be mildly hypotensive. Right foot xray was negative for fracture. Patient was given 1 dose of Percocet, which he tolerated and which well controlled his pain. It was planned for [name] to be admitted for observation overnight for hypotension. However, patient elected to leave AMA secondary to adequate pain control and because no fracture was present.   Medications: Added new medication of VICODIN 5-500 MG TABS (HYDROCODONE-ACETAMINOPHEN) take 1 tab by mouth q6 hours as needed for pain - Signed Rx of VICODIN 5-500 MG TABS (HYDROCODONE-ACETAMINOPHEN) take 1 tab by mouth q6 hours as needed for pain;  #30 x 0;  Signed;  Entered by: Johnette Abraham DO;  Authorized by: Deatra Robinson MD;  Method used: Historical   Prescriptions: VICODIN 5-500 MG TABS (HYDROCODONE-ACETAMINOPHEN) take 1 tab by mouth q6 hours as needed for pain  #30 x 0   Entered by:   Johnette Abraham DO   Authorized by:   Deatra Robinson MD   Signed by:   Johnette Abraham DO on 04/30/2010   Method used:   Historical   RxID:   [id number]

## 2010-10-29 NOTE — Miscellaneous (Signed)
Summary: Bridge Counselor  Clinical Lists Changes Patient was enrolled into the [facility] program on 01/23/10. BC provided TA this date as pt admits to being non compliant with his medications. Pt is aware of his low CD4 count and high VL. Pt now has a pillbox and is committed to "doing better." [facility] assisted pt with applying for food stamps on 01/30/10.  Selena Batten [facility]  Feb 08, 2010 2:01 PM

## 2010-10-29 NOTE — Assessment & Plan Note (Signed)
Summary: F/U [[name]]   Primary Provider:  Terrial Rhodes, MD  CC:  Depression, follow-up visit, knee started hurting in the middle of the night, sharp pain, seen in ED, not a clot, and checked for that.  History of Present Illness:   [name] is in for his routine visit.  Several days ago he developed right lateral knee pain.  He denies any fall or trauma to the knee.  He says he had a similar episode 20 to 30 years ago that resolved spontaneously.  He has not had any fever, chills or sweats.  Other than his knee pain he is doing much better.  He is happy to be gaining weight.  He can pick out all of his medicines off the chart and appears to be taking them correctly.  He uses his pillbox and meets with Bjorn Loser, the appearance counselor every one to two weeks.  He no longer feels depressed.  He does not drink any alcohol or use any street drugs.  He does continue to smoke cigarettes and does not feel that he is able to quit smoking right now.  Depression History:      The patient denies a depressed mood most of the day and a diminished interest in his usual daily activities.        Comments:  pain causing depression.   Preventive Screening-Counseling & Management  Alcohol-Tobacco     Alcohol drinks/day: 0     Smoking Status: current     Smoking Cessation Counseling: yes     Smoke Cessation Stage: contemplative     Packs/Day: 0.5     Year Quit: 2008  Caffeine-Diet-Exercise     Caffeine use/day: tea     Does Patient Exercise: occassionally     Type of exercise: walk     Times/week: <3  Hep-HIV-STD-Contraception     HIV Risk: no risk noted     HIV Risk Counseling: not indicated-no HIV risk noted  Safety-Violence-Falls     Seat Belt Use: yes  Comments: declined condoms      Sexual History:  n/a.        Drug Use:  former.     Prior Medication List:  EPIVIR 10 MG/ML SOLN (LAMIVUDINE) 5 ml by mouth every evening VIREAD 300 MG TABS (TENOFOVIR DISOPROXIL FUMARATE) Take 1 tablet  by mouth once a week REYATAZ 300 MG CAPS (ATAZANAVIR SULFATE) Take 1 tablet by mouth once a day NORVIR 100 MG TABS (RITONAVIR) Take 1 tablet by mouth once a day SEPTRA DS 800-160 MG TABS (SULFAMETHOXAZOLE-TRIMETHOPRIM) Take 1 tablet by mouth every Monday, Wednesday, and Friday DIFLUCAN 100 MG TABS (FLUCONAZOLE) Take 1 tablet by mouth once a week ZITHROMAX 600 MG TABS (AZITHROMYCIN) Take 2 tablets by mouth once a week VALTREX 500 MG TABS (VALACYCLOVIR HCL) Take 1 tablet by mouth once a day VICODIN 5-500 MG TABS (HYDROCODONE-ACETAMINOPHEN) take 1 tab by mouth q6 hours as needed for pain   Current Allergies (reviewed today): ! PCN ! PERCOCET ! * TYLENOL #3 Social History: Sexual History:  n/a Drug Use:  former  Vital Signs:  Patient profile:   45 year old male Height:      71 inches (180.34 cm) Weight:      146.75 pounds (66.70 kg) BMI:     20.54 Temp:     97.6 degrees F (36.44 degrees C) oral Pulse rate:   211 / minute Cuff size:   regular  Vitals Entered By: Jennet Maduro RN (August 20, 2010 11:33  AM) CC: Depression, follow-up visit, knee started hurting in the middle of the night, sharp pain, seen in ED, not a clot, checked for that Is Patient Diabetic? No Pain Assessment Patient in pain? yes     Location: right side knee cap Intensity: 9 Type: thropping Onset of pain  hydrocodone Nutritional Status BMI of 19 -24 = normal Nutritional Status Detail appetite "pretty good"  Have you ever been in a relationship where you felt threatened, hurt or afraid?No   Does patient need assistance? Functional Status Self care Ambulation Impaired:Risk for fall Comments no missed doses, using crutches given to him at the ED,        Medication Adherence: 08/20/2010   Adherence to medications reviewed with patient. Counseling to provide adequate adherence provided            08/20/2010   Patient was screened for substance abuse and depression. Referal was made as indicated.                        Physical Exam  General:  alert and well-nourished.   Mouth:  pharynx pink and moist, no erythema, no exudates, and poor dentition.   Lungs:  normal respiratory effort, normal breath sounds, no crackles, and no wheezes.   Heart:  normal rate, regular rhythm, and no murmur.   Msk:  normal ROM, no joint swelling, no joint warmth, and no redness over joints.  he has focal tenderness over the right lateral knee without any other objective abnormalities noted. Psych:  normally interactive, good eye contact, not anxious appearing, and not depressed appearing.     Impression & Recommendations:  Problem # 1:  HIV DISEASE (ICD-042) His HIV infection is under markedly improved control due to his improved adherence.  He will continue on his current regimen. His updated medication list for this problem includes:    Septra Ds 800-160 Mg Tabs (Sulfamethoxazole-trimethoprim) .Marland Kitchen... Take 1 tablet by mouth every monday, wednesday, and friday    Diflucan 100 Mg Tabs (Fluconazole) .Marland Kitchen... Take 1 tablet by mouth once a week    Zithromax 600 Mg Tabs (Azithromycin) .Marland Kitchen... Take 2 tablets by mouth once a week    Valtrex 500 Mg Tabs (Valacyclovir hcl) .Marland Kitchen... 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  No abnormality was seen on a recent MRI scan when he went to the emergency department.  I suggested that he use anti-inflammatory agents and a heating pad and see if it improves. His updated medication list for this problem includes:    Vicodin 5-500 Mg Tabs (Hydrocodone-acetaminophen) .Marland Kitchen... Take 1 tab by mouth q6 hours as needed  for pain  Orders: Est. Patient Level IV (16109)  Problem # 3:  DEPRESSION (ICD-311) His depression has resolved. Orders: Est. Patient Level IV (60454)  Problem # 4:  CIGARETTE SMOKER (ICD-305.1) I have encouraged him to consider quitting cigarettes completely. Orders: Est. Patient Level IV (09811)  Other Orders: Future Orders: T-CD4SP ([facility]) (CD4SP) ... 11/18/2010 T-HIV Viral Load [phone]) ... 11/18/2010 T-Comprehensive Metabolic Panel [phone]) ... 11/18/2010 T-CBC w/Diff ([id number]) .Marland KitchenMarland Kitchen  11/18/2010 T-RPR (Syphilis) [id number]) ... 11/18/2010 T-Lipid Profile [phone]) ... 11/18/2010  Patient Instructions: 1)  Please schedule a follow-up appointment in 3 months.    Influenza Immunization History:    Influenza # 1:  Historical (07/01/2010)

## 2010-10-29 NOTE — Progress Notes (Addendum)
Summary: Meds unable to be delivered. Pharmacy can't reach patient.  Phone Note Outgoing Call   Call placed by: Paulo Fruit  BS,CPht II,MPH,  April 22, 2010 4:30 PM Call placed to: Patient Details for Reason: Medications not being delivered Summary of Call: Tried to reach patient to let him know that [facility] has been calling him to set up his delivery for his medications.  I was unable to speak to patient so I left my number for him to call me asap. Initial call taken by: Paulo Fruit  BS,CPht II,MPH,  April 22, 2010 4:31 PM Caller: [facility] Details for Reason: Cannont reach patient to set up delivery Summary of Call: Pharmacy called to see if we had a different number to reach patient.  They have made seveal attempts to reach patient. Initial call taken by: Paulo Fruit  BS,CPht II,MPH,  April 22, 2010 4:29 PM  Follow-up for Phone Call        I spoke to [facility] and they have the same number that we have.  I have tried to reach patient too.  I was unable to speak to patient but did leave a message for him to contact the office.  [facility] will not send any medication until they speak to the patient. Follow-up by: Paulo Fruit  BS,CPht II,MPH,  April 22, 2010 4:30 PM     Appended Document: Meds unable to be delivered. Pharmacy can't reach patient. Patient called back and said that he had talked to [facility] 2 months ago.  Patient had asked to have it set up at [facility] on [address]  I gave patient the support phone number to call and get this straighten out.  According to [facility] ADADP Purchase of Medical Care Services--ADAP patients that want to pick up medications at a local [facility] must use the 24 hour [facility] on [address] otherwise, they must obtain through the mail from the [place] or [place], [place] mail order/retail store.  Appended Document: Meds unable to be delivered. Pharmacy can't reach patient. Please call Home Care Provider,  who have been helping [name] with treatment adherence and see if they can reach him.  Appended Document: Meds unable to be delivered. Pharmacy can't reach patient. Phone called placed to Olin Hauser, RN homehealth nurse who spoke to the patient earlier today.  She is going to call [facility] for patient to see if she can't get it set up for  him.

## 2010-10-29 NOTE — Miscellaneous (Signed)
Summary: Orders Update - Bridge Counseling for OV adherence  Clinical Lists Changes  Orders: Added new Referral order of Misc. Referral (Misc. Ref) - Signed

## 2010-10-29 NOTE — Miscellaneous (Signed)
Summary: Orders Update  Clinical Lists Changes  Orders: Added new Test order of T-CD4SP ([facility]) (CD4SP) - Signed Added new Test order of T-CBC w/Diff ([id number]) - Signed Added new Test order of T-Comprehensive Metabolic Panel ([id number]) - Signed Added new Test order of T-HIV Viral Load ([id number]) - Signed 

## 2010-10-29 NOTE — Letter (Signed)
Summary: [facility]: Medical Case Mgt. Referral  [facility]: Medical Case Mgt. Referral   Imported By: Florinda Marker 11/15/2009 13:53:39  _____________________________________________________________________  External Attachment:    Type:   Image     Comment:   External Document

## 2010-10-29 NOTE — Miscellaneous (Signed)
Summary: HCP Continuous Care: Home Health Cert. &  Plan Of Care   HCP Continuous Care: Home Health Cert. &  Plan Of Care   Imported By: Florinda Marker 07/25/2010 14:55:04  _____________________________________________________________________  External Attachment:    Type:   Image     Comment:   External Document

## 2010-10-29 NOTE — Miscellaneous (Signed)
Summary: clinical update/[place] white [place] completed  Clinical Lists Changes  Observations: Added new observation of PCTFPL: 107.74  (10/30/2009 10:26) Added new observation of HOUSEINCOME: 52841  (10/30/2009 10:26) Added new observation of #CHILD<18 IN: No  (10/30/2009 10:26) Added new observation of FAMILYSIZE: 1  (10/30/2009 10:26) Added new observation of FINASSESSDT: 10/26/2009  (10/30/2009 10:26) Added new observation of RW VITAL STA: Active  (10/30/2009 10:26)

## 2010-10-29 NOTE — Assessment & Plan Note (Signed)
Summary: 6WK F/U/VS   Primary Provider:  Terrial Rhodes, MD  CC:  follow-up visit.  History of Present Illness: [name] is in for his routine visit.  He did not get blood work before his visit today.  He has his pillbox with him but not his bottles.  He says his girlfriend fills out the pillbox every Sunday. It is apparent that he continues to miss some doses.  He also does seems to take his medications at different times each day.  Preventive Screening-Counseling & Management  Alcohol-Tobacco     Alcohol drinks/day: 0     Smoking Status: current     Smoking Cessation Counseling: yes     Smoke Cessation Stage: contemplative     Packs/Day: 0.5     Year Quit: 2008  Caffeine-Diet-Exercise     Caffeine use/day: tea     Does Patient Exercise: occassionally     Type of exercise: walk     Times/week: <3   Updated Prior Medication List: EPIVIR 10 MG/ML SOLN (LAMIVUDINE) 5 ml by mouth every evening VIREAD 300 MG TABS (TENOFOVIR DISOPROXIL FUMARATE) Take 1 tablet by mouth once a week REYATAZ 300 MG CAPS (ATAZANAVIR SULFATE) Take 1 tablet by mouth once a day NORVIR 100 MG TABS (RITONAVIR) Take 1 tablet by mouth once a day SEPTRA DS 800-160 MG TABS (SULFAMETHOXAZOLE-TRIMETHOPRIM) Take 1 tablet by mouth every Monday, Wednesday, and Friday DIFLUCAN 100 MG TABS (FLUCONAZOLE) Take 1 tablet by mouth once a week ZITHROMAX 600 MG TABS (AZITHROMYCIN) Take 2 tablets by mouth once a week VALTREX 500 MG TABS (VALACYCLOVIR HCL) Take 1 tablet by mouth once a day  Current Allergies (reviewed today): ! PCN ! PERCOCET ! * TYLENOL #3 Vital Signs:  Patient profile:   44 year old male Height:      71 inches (180.34 cm) Weight:      139.5 pounds (63.41 kg) BMI:     19.53 Temp:     98.3 degrees F (36.83 degrees C) oral Pulse rate:   83 / minute  Vitals Entered By: Denise Estridge RN (April 04, 2010 11:06 AM) CC: follow-up visit Is Patient Diabetic? No Pain Assessment Patient in pain? no       Nutritional Status BMI of 19 -24 = normal Nutritional Status Detail Appetie "pretty good"  Have you ever been in a relationship where you felt threatened, hurt or afraid?No   Does patient need assistance? Functional Status Self care Ambulation Normal Comments missed the mornign dose this past Sunday   Serial Vital Signs/Assessments:  Time      Position  BP       Pulse  Resp  Temp     By 11:09 AM  R Leg     15 5/79                         Jennet Maduro RN        Medication Adherence: 04/04/2010   Adherence to medications reviewed with patient. Counseling to provide adequate adherence provided                                Physical Exam  General:  alert and well-nourished.   Mouth:  pharynx pink and moist, no erythema, no exudates, and fair dentition.   Lungs:  normal respiratory effort, normal breath sounds, no crackles, and no wheezes.   Heart:  normal rate, regular rhythm,  and no murmur.   Extremities:  right arm graft appears normal Skin:  facial seborrhea.   Impression & Recommendations:  Problem # 1:  HIV DISEASE (ICD-042) [name] continues to struggle with adherence and his last viral load was up even further.  I will repeat a viral load with a genotype resistance test today and get him set up with the treatment appearance counselor.  I will see him back in 4 weeks to see if he needs a new regimen. The following medications were removed from the medication list:    Keflex 500 Mg Caps (Cephalexin) .Marland Kitchen... Take 1 capsule by mouth once a day His updated medication list for this problem includes:    Septra Ds 800-160 Mg Tabs (Sulfamethoxazole-trimethoprim) .Marland Kitchen... Take 1 tablet by mouth every monday, wednesday, and friday    Diflucan 100 Mg Tabs (Fluconazole) .Marland Kitchen... Take 1 tablet by mouth once a week    Zithromax 600 Mg Tabs (Azithromycin) .Marland Kitchen... Take 2 tablets by mouth once a week    Valtrex 500 Mg Tabs (Valacyclovir hcl) .Marland Kitchen... 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 Referral (Misc. Ref)  Other Orders: T-HIV Genotype (04540-98119) T-HIV Viral Load (14782-95621) Est. Patient Level III (30865)  Patient Instructions: 1)  Please schedule a follow-up appointment in 1 month.  Appended Document: Orders Update - Home Care Providerfs Referral    Clinical Lists Changes  Orders: Added new Referral order of Misc. Referral (Misc. Ref) - Signed

## 2010-10-29 NOTE — Miscellaneous (Signed)
Summary: Home Care: Medical Case Mgt. Referral  Home Care: Medical Case Mgt. Referral   Imported By: Florinda Marker 04/08/2010 14:40:23  _____________________________________________________________________  External Attachment:    Type:   Image     Comment:   External Document

## 2010-10-29 NOTE — Miscellaneous (Signed)
Summary: [facility] Counselor  Clinical Lists Changes BC made a home visit with pt and obtained all his medical bills for his food stamp application. BC dropped off everything at [facility] today and will close pt's file next week. Pt is working with Home Care Providers at this time.  Sharol Roussel  April 26, 2010 2:45 PM

## 2010-11-11 ENCOUNTER — Encounter: Payer: Self-pay | Admitting: Internal Medicine

## 2010-11-11 ENCOUNTER — Ambulatory Visit (INDEPENDENT_AMBULATORY_CARE_PROVIDER_SITE_OTHER): Payer: Medicare Other | Admitting: Internal Medicine

## 2010-11-11 ENCOUNTER — Other Ambulatory Visit: Payer: Self-pay | Admitting: Internal Medicine

## 2010-11-11 DIAGNOSIS — B20 Human immunodeficiency virus [HIV] disease: Secondary | ICD-10-CM

## 2010-11-11 LAB — CONVERTED CEMR LAB
Albumin: 3.8 g/dL (ref 3.5–5.2)
BUN: 48 mg/dL — ABNORMAL HIGH (ref 6–23)
Basophils Absolute: 0 10*3/uL (ref 0.0–0.1)
Basophils Relative: 1 % (ref 0–1)
CO2: 29 meq/L (ref 19–32)
Calcium: 8.8 mg/dL (ref 8.4–10.5)
Chloride: 96 meq/L (ref 96–112)
Glucose, Bld: 95 mg/dL (ref 70–99)
HDL: 39 mg/dL — ABNORMAL LOW (ref >39)
Hemoglobin: 12 g/dL — ABNORMAL LOW (ref 13.0–17.0)
LDL Cholesterol: 91 mg/dL (ref 0–99)
Lymphocytes Relative: 42 % (ref 12–46)
Monocytes Absolute: 0.9 10*3/uL (ref 0.1–1.0)
Monocytes Relative: 17 % — ABNORMAL HIGH (ref 3–12)
Neutro Abs: 2 10*3/uL (ref 1.7–7.7)
Neutrophils Relative %: 36 % — ABNORMAL LOW (ref 43–77)
Potassium: 5.2 meq/L (ref 3.5–5.3)
RBC: 3.33 M/uL — ABNORMAL LOW (ref 4.22–5.81)
Sodium: 141 meq/L (ref 135–145)
Total CHOL/HDL Ratio: 4.9
Total Protein: 7.7 g/dL (ref 6.0–8.3)
Triglycerides: 301 mg/dL — ABNORMAL HIGH (ref <150)

## 2010-11-18 ENCOUNTER — Encounter (INDEPENDENT_AMBULATORY_CARE_PROVIDER_SITE_OTHER): Payer: Self-pay | Admitting: *Deleted

## 2010-11-20 NOTE — Assessment & Plan Note (Signed)
Summary: follow up visit   Vital Signs:  Patient profile:   45 year old male Height:      71 inches (180.34 cm) Weight:      162.5 pounds (73.86 kg) BMI:     22.75 Temp:     97.4 degrees F (36.33 degrees C) oral Pulse rate:   76 / minute BP sitting:   138 / 86  (right arm) Cuff size:   large  Vitals Entered By: Jennet Maduro RN (November 11, 2010 9:23 AM) CC: started on doxycycline last week by kidney docotr for sinus infection. Is Patient Diabetic? No Pain Assessment Patient in pain? no      Nutritional Status BMI of 19 -24 = normal Nutritional Status Detail appetite "very good"n  Does patient need assistance? Functional Status Self care Ambulation Normal Comments no missed doses of HIV rxes   Primary Provider:  Terrial Rhodes, MD  CC:  started on doxycycline last week by kidney docotr for sinus infection.Marland Kitchen  History of Present Illness: Fillmore is in for his routine visit.  He states he is feeling much better than he did last year.  He denies missing any doses of his medications.  He continues to use his pillbox filling it out every Saturday morning.  He is still taking his girlfriend but they have not been sexually active recently.  Preventive Screening-Counseling & Management  Alcohol-Tobacco     Alcohol drinks/day: 0     Smoking Status: current     Smoking Cessation Counseling: yes     Smoke Cessation Stage: contemplative     Packs/Day: 0.5     Year Quit: 2008  Caffeine-Diet-Exercise     Caffeine use/day: tea     Does Patient Exercise: occassionally     Type of exercise: walk     Times/week: <3  Hep-HIV-STD-Contraception     HIV Risk: no risk noted     HIV Risk Counseling: not indicated-no HIV risk noted  Safety-Violence-Falls     Seat Belt Use: yes  Comments: declined condoms      Sexual History:  n/a.        Drug Use:  former.    Current Medications (verified): 1)  Epivir 10 Mg/ml Soln (Lamivudine) .... 5 Ml By Mouth Every Evening 2)   Viread 300 Mg Tabs (Tenofovir Disoproxil Fumarate) .... Take 1 Tablet By Mouth Once A Week 3)  Reyataz 300 Mg Caps (Atazanavir Sulfate) .... Take 1 Tablet By Mouth Once A Day 4)  Norvir 100 Mg Tabs (Ritonavir) .... Take 1 Tablet By Mouth Once A Day 5)  Septra Ds 800-160 Mg Tabs (Sulfamethoxazole-Trimethoprim) .... Take 1 Tablet By Mouth Every Monday, Wednesday, and Friday 6)  Diflucan 100 Mg Tabs (Fluconazole) .... Take 1 Tablet By Mouth Once A Week 7)  Zithromax 600 Mg Tabs (Azithromycin) .... Take 2 Tablets By Mouth Once A Week 8)  Valtrex 500 Mg Tabs (Valacyclovir Hcl) .... Take 1 Tablet By Mouth Once A Day 9)  Vicodin 5-500 Mg Tabs (Hydrocodone-Acetaminophen) .... Take 1 Tab By Mouth Q6 Hours As Needed For Pain 10)  Doxycycline Hyclate 100 Mg Caps (Doxycycline Hyclate) .... Take 1 Capsule By Mouth Two Times A Day  Allergies: 1)  ! Pcn 2)  ! Percocet 3)  ! * Tylenol #3  Physical Exam  General:  alert and well-nourished.   Mouth:  pharynx pink and moist and poor dentition.   Lungs:  normal respiratory effort, normal breath sounds, no crackles, and no wheezes.  Heart:  normal rate, regular rhythm, and no murmur.          Medication Adherence: 11/11/2010   Adherence to medications reviewed with patient. Counseling to provide adequate adherence provided   Prevention For Positives: 11/11/2010   Safe sex practices discussed with patient. Condoms offered.                             Impression & Recommendations:  Problem # 1:  HIV DISEASE (ICD-042) His adherence and has improved dramatically.  I will continue his current regimen and check a full set of lab work today. His updated medication list for this problem includes:    Septra Ds 800-160 Mg Tabs (Sulfamethoxazole-trimethoprim) .Marland Kitchen... Take 1 tablet by mouth every monday, wednesday, and friday    Diflucan 100 Mg Tabs (Fluconazole) .Marland Kitchen... Take 1 tablet by mouth once a week    Zithromax 600 Mg Tabs (Azithromycin) .Marland Kitchen... Take 2  tablets by mouth once a week    Valtrex 500 Mg Tabs (Valacyclovir hcl) .Marland Kitchen... Take 1 tablet by mouth once a day    Doxycycline Hyclate 100 Mg Caps (Doxycycline hyclate) .Marland Kitchen... 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 Take 1 capsule by mouth two times a day  Other Orders: T-CD4SP [facility]) (CD4SP) T-HIV Viral Load [phone]) T-Comprehensive Metabolic Panel [phone]) T-Lipid Profile [phone]) T-CBC w/Diff [phone]) T-RPR (Syphilis) (72536-64403) Est. Patient Level III (47425)  Patient Instructions: 1)  Please schedule a follow-up appointment in 3 months.    Orders Added: 1)  T-CD4SP [facility]) [CD4SP] 2)  T-HIV Viral Load [phone] 3)  T-Comprehensive Metabolic Panel [80053-22900] 4)  T-Lipid Profile [80061-22930] 5)  T-CBC w/Diff [32951-88416] 6)  T-RPR (Syphilis) [60630-16010] 7)  Est. Patient Level III [93235]

## 2010-11-26 NOTE — Miscellaneous (Signed)
  Clinical Lists Changes 

## 2010-12-10 LAB — CBC
Hemoglobin: 12.6 g/dL — ABNORMAL LOW (ref 13.0–17.0)
MCH: 35.5 pg — ABNORMAL HIGH (ref 26.0–34.0)
MCHC: 32.7 g/dL (ref 30.0–36.0)
MCV: 108.5 fL — ABNORMAL HIGH (ref 78.0–100.0)
RBC: 3.55 MIL/uL — ABNORMAL LOW (ref 4.22–5.81)

## 2010-12-10 LAB — POCT I-STAT, CHEM 8
BUN: 25 mg/dL — ABNORMAL HIGH (ref 6–23)
Creatinine, Ser: 8.7 mg/dL — ABNORMAL HIGH (ref 0.4–1.5)
Glucose, Bld: 99 mg/dL (ref 70–99)
Hemoglobin: 13.6 g/dL (ref 13.0–17.0)
Potassium: 5 mEq/L (ref 3.5–5.1)
Sodium: 137 mEq/L (ref 135–145)

## 2010-12-10 LAB — DIFFERENTIAL
Basophils Relative: 1 % (ref 0–1)
Eosinophils Absolute: 0.3 10*3/uL (ref 0.0–0.7)
Eosinophils Relative: 6 % — ABNORMAL HIGH (ref 0–5)
Lymphs Abs: 1.9 10*3/uL (ref 0.7–4.0)
Monocytes Absolute: 0.8 10*3/uL (ref 0.1–1.0)
Monocytes Relative: 15 % — ABNORMAL HIGH (ref 3–12)
Neutrophils Relative %: 42 % — ABNORMAL LOW (ref 43–77)

## 2010-12-10 LAB — POCT CARDIAC MARKERS: Myoglobin, poc: 447 ng/mL (ref 12–200)

## 2010-12-10 LAB — TROPONIN I: Troponin I: 0.01 ng/mL (ref 0.00–0.06)

## 2010-12-10 LAB — CK TOTAL AND CKMB (NOT AT ARMC)
CK, MB: 1.3 ng/mL (ref 0.3–4.0)
Relative Index: INVALID (ref 0.0–2.5)
Total CK: 45 U/L (ref 7–232)

## 2010-12-12 LAB — T-HELPER CELL (CD4) - (RCID CLINIC ONLY)
CD4 % Helper T Cell: 7 % — ABNORMAL LOW (ref 33–55)
CD4 T Cell Abs: 130 uL — ABNORMAL LOW (ref 400–2700)

## 2010-12-13 LAB — POCT I-STAT, CHEM 8
BUN: 32 mg/dL — ABNORMAL HIGH (ref 6–23)
Creatinine, Ser: 9.7 mg/dL — ABNORMAL HIGH (ref 0.4–1.5)
Glucose, Bld: 104 mg/dL — ABNORMAL HIGH (ref 70–99)
Hemoglobin: 14.6 g/dL (ref 13.0–17.0)
Sodium: 136 mEq/L (ref 135–145)
TCO2: 28 mmol/L (ref 0–100)

## 2010-12-15 LAB — T-HELPER CELL (CD4) - (RCID CLINIC ONLY): CD4 % Helper T Cell: 4 % — ABNORMAL LOW (ref 33–55)

## 2010-12-23 LAB — T-HELPER CELL (CD4) - (RCID CLINIC ONLY)
CD4 % Helper T Cell: 8 % — ABNORMAL LOW (ref 33–55)
CD4 T Cell Abs: 80 uL — ABNORMAL LOW (ref 400–2700)

## 2010-12-25 ENCOUNTER — Other Ambulatory Visit: Payer: Self-pay | Admitting: *Deleted

## 2010-12-25 MED ORDER — FLUCONAZOLE 100 MG PO TABS: 100.0000 mg | ORAL_TABLET | ORAL | Status: DC

## 2010-12-26 ENCOUNTER — Other Ambulatory Visit (INDEPENDENT_AMBULATORY_CARE_PROVIDER_SITE_OTHER): Payer: Medicare Other | Admitting: Internal Medicine

## 2010-12-26 DIAGNOSIS — Z21 Asymptomatic human immunodeficiency virus [HIV] infection status: Secondary | ICD-10-CM

## 2010-12-26 DIAGNOSIS — B20 Human immunodeficiency virus [HIV] disease: Secondary | ICD-10-CM

## 2011-01-01 LAB — POTASSIUM: Potassium: 3.7 mEq/L (ref 3.5–5.1)

## 2011-01-02 LAB — CBC
Hemoglobin: 13 g/dL (ref 13.0–17.0)
Platelets: 64 10*3/uL — ABNORMAL LOW (ref 150–400)
RDW: 23.2 % — ABNORMAL HIGH (ref 11.5–15.5)
WBC: 3.2 10*3/uL — ABNORMAL LOW (ref 4.0–10.5)

## 2011-01-02 LAB — DIFFERENTIAL
Basophils Absolute: 0.1 10*3/uL (ref 0.0–0.1)
Lymphocytes Relative: 35 % (ref 12–46)
Lymphs Abs: 1.1 10*3/uL (ref 0.7–4.0)
Neutro Abs: 1.4 10*3/uL — ABNORMAL LOW (ref 1.7–7.7)

## 2011-01-02 LAB — POCT I-STAT 3, ART BLOOD GAS (G3+)
Bicarbonate: 27 mEq/L — ABNORMAL HIGH (ref 20.0–24.0)
pCO2 arterial: 40 mmHg (ref 35.0–45.0)
pO2, Arterial: 96 mmHg (ref 80.0–100.0)

## 2011-01-02 LAB — BASIC METABOLIC PANEL
BUN: 31 mg/dL — ABNORMAL HIGH (ref 6–23)
Calcium: 5.7 mg/dL — CL (ref 8.4–10.5)
GFR calc non Af Amer: 5 mL/min — ABNORMAL LOW (ref >60)
Glucose, Bld: 92 mg/dL (ref 70–99)
Potassium: 3.6 mEq/L (ref 3.5–5.1)
Sodium: 137 mEq/L (ref 135–145)

## 2011-01-02 LAB — LACTATE DEHYDROGENASE: LDH: 339 U/L — ABNORMAL HIGH (ref 94–250)

## 2011-01-03 LAB — COMPREHENSIVE METABOLIC PANEL
Albumin: 2.7 g/dL — ABNORMAL LOW (ref 3.5–5.2)
Alkaline Phosphatase: 507 U/L — ABNORMAL HIGH (ref 39–117)
BUN: 35 mg/dL — ABNORMAL HIGH (ref 6–23)
CO2: 30 mEq/L (ref 19–32)
Chloride: 97 mEq/L (ref 96–112)
GFR calc non Af Amer: 5 mL/min — ABNORMAL LOW (ref >60)
Glucose, Bld: 75 mg/dL (ref 70–99)
Potassium: 4.9 mEq/L (ref 3.5–5.1)
Total Bilirubin: 0.9 mg/dL (ref 0.3–1.2)

## 2011-01-03 LAB — CBC
HCT: 40.5 % (ref 39.0–52.0)
Hemoglobin: 12.8 g/dL — ABNORMAL LOW (ref 13.0–17.0)
RBC: 4.35 MIL/uL (ref 4.22–5.81)
WBC: 3.4 10*3/uL — ABNORMAL LOW (ref 4.0–10.5)

## 2011-01-03 LAB — HIV-1 RNA QUANT-NO REFLEX-BLD
HIV 1 RNA Quant: 410000 copies/mL — ABNORMAL HIGH (ref <48)
HIV-1 RNA Quant, Log: 5.61 {Log} — ABNORMAL HIGH (ref <1.68)

## 2011-01-03 LAB — PHOSPHORUS: Phosphorus: 6.5 mg/dL — ABNORMAL HIGH (ref 2.3–4.6)

## 2011-01-03 LAB — DIFFERENTIAL
Basophils Absolute: 0 10*3/uL (ref 0.0–0.1)
Eosinophils Absolute: 0 10*3/uL (ref 0.0–0.7)
Lymphocytes Relative: 27 % (ref 12–46)
Neutro Abs: 1.9 10*3/uL (ref 1.7–7.7)

## 2011-01-07 LAB — POCT I-STAT 4, (NA,K, GLUC, HGB,HCT)
Glucose, Bld: 84 mg/dL (ref 70–99)
Sodium: 141 mEq/L (ref 135–145)

## 2011-01-10 ENCOUNTER — Encounter (INDEPENDENT_AMBULATORY_CARE_PROVIDER_SITE_OTHER): Payer: Medicare Other

## 2011-01-10 DIAGNOSIS — T82898A Other specified complication of vascular prosthetic devices, implants and grafts, initial encounter: Secondary | ICD-10-CM

## 2011-01-13 LAB — GC/CHLAMYDIA PROBE AMP, GENITAL
Chlamydia, DNA Probe: NEGATIVE
GC Probe Amp, Genital: NEGATIVE

## 2011-01-13 LAB — POCT I-STAT 4, (NA,K, GLUC, HGB,HCT)
Hemoglobin: 13.9 g/dL (ref 13.0–17.0)
Potassium: 4.2 mEq/L (ref 3.5–5.1)

## 2011-01-23 NOTE — Procedures (Unsigned)
VASCULAR LAB EXAM  INDICATION:  Decreased access flow.  HISTORY: Diabetes: Cardiac: Hypertension:  EXAM:  Right arm fistula duplex.  IMPRESSION:  Patent right radiocephalic Cimino fistula.  The proximal anastomosis was 2.98 over 1.49 meters per second.  Branches visualized with the cephalic vein at the mid forearm as well as the antecubital fossa.  The median cubital vein is the outflow vein.  Heavily calcified radial, brachial and ulnar arteries.  The cephalic vein decreases in size past the antecubital fossa moderately with decreased flow.  Please be aware that the patient did dialysis yesterday being a date of 01/09/2011.      ___________________________________________ Larina Earthly, M.D.  OD/MEDQ  D:  01/10/2011  T:  01/10/2011  Job:  [id number]

## 2011-01-26 ENCOUNTER — Other Ambulatory Visit (INDEPENDENT_AMBULATORY_CARE_PROVIDER_SITE_OTHER): Payer: Medicare Other | Admitting: Internal Medicine

## 2011-01-26 DIAGNOSIS — Z21 Asymptomatic human immunodeficiency virus [HIV] infection status: Secondary | ICD-10-CM

## 2011-01-26 DIAGNOSIS — B20 Human immunodeficiency virus [HIV] disease: Secondary | ICD-10-CM

## 2011-01-27 ENCOUNTER — Other Ambulatory Visit (INDEPENDENT_AMBULATORY_CARE_PROVIDER_SITE_OTHER): Payer: Medicare Other | Admitting: *Deleted

## 2011-01-27 DIAGNOSIS — B009 Herpesviral infection, unspecified: Secondary | ICD-10-CM

## 2011-01-27 DIAGNOSIS — Z21 Asymptomatic human immunodeficiency virus [HIV] infection status: Secondary | ICD-10-CM

## 2011-01-27 DIAGNOSIS — B20 Human immunodeficiency virus [HIV] disease: Secondary | ICD-10-CM

## 2011-01-27 MED ORDER — FLUCONAZOLE 100 MG PO TABS: 100.0000 mg | ORAL_TABLET | ORAL | Status: DC

## 2011-01-27 MED ORDER — SULFAMETHOXAZOLE-TMP DS 800-160 MG PO TABS: 1.0000 | ORAL_TABLET | Freq: Once | ORAL | Status: DC

## 2011-01-27 MED ORDER — TENOFOVIR DISOPROXIL FUMARATE 300 MG PO TABS: 300.0000 mg | ORAL_TABLET | ORAL | Status: DC

## 2011-01-27 MED ORDER — VALACYCLOVIR HCL 500 MG PO TABS: 500.0000 mg | ORAL_TABLET | Freq: Every day | ORAL | Status: DC

## 2011-01-27 MED ORDER — LAMIVUDINE 10 MG/ML PO SOLN: 50.0000 mg | Freq: Every day | ORAL | Status: DC

## 2011-01-27 MED ORDER — AZITHROMYCIN 600 MG PO TABS: 1200.0000 mg | ORAL_TABLET | ORAL | Status: DC

## 2011-01-27 MED ORDER — RITONAVIR 100 MG PO TABS: 100.0000 mg | ORAL_TABLET | Freq: Every day | ORAL | Status: DC

## 2011-01-31 MED ORDER — ATAZANAVIR SULFATE 300 MG PO CAPS: 300.0000 mg | ORAL_CAPSULE | Freq: Every day | ORAL | Status: DC

## 2011-02-11 NOTE — Op Note (Signed)
NAMEEFREM, PITSTICK          ACCOUNT NO.:  [id number]   MEDICAL RECORD NO.:  [id number]          PATIENT TYPE:  AMB   LOCATION:  SDS                          FACILITY:  [facility]   PHYSICIAN:  Quita Skye. Hart Rochester, M.D.  DATE OF BIRTH:  Feb 13, 1966   DATE OF PROCEDURE:  DATE OF DISCHARGE:  09/13/2008                               OPERATIVE REPORT   DATE OF OPERATION:  September 13, 2008.   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   OPERATIONS:  1. Insertion of the left forearm AV Gore-Tex graft, brachial artery to      brachial vein with 4 x 7 mm Gore-Tex.  2. Ligation left upper arm AV fistula with evacuation of thrombus from      aneurysm.   SURGEON:  Quita Skye. Hart Rochester, MD.   FIRST ASSISTANT:  Nurse.   ANESTHESIA:  General endotracheal.   PROCEDURE:  The patient was taken to the operating room, placed in the  supine position, at which time satisfactory general endotracheal  anesthesia was administered.  The left upper extremity was prepped with  Betadine scrub and solution draped in the routine sterile manner.  General endotracheal anesthesia had been administered.  The left upper  arm fistula which arose from the brachial artery at the antecubital area  was exposed after making a transverse incision through the previous  scar.  The artery was exposed proximally and distally, and the cephalic  vein was dissected free up about 6 cm.  About another 6 cm proximal to  this, there was an aneurysm involving the fistula which was partially  thrombosed.  The veins were explored in the antecubital area.  The  basilic vein was about 3 mm but the deep brachial vein was 5 mm and it  was dissected free from the graft.  Loop-shaped tunnel was then created  and a 4 x 7 mm stretch  Gore-Tex graft delivered through the tunnel.  No  heparin was given.  Artery was occluded proximally and distally.  The  previous fistula was transected at the anastomosis and using the  transected end of the cephalic vein, the organized thrombus from the  aneurysm in the fistula was expressed out of the transected as much as  possible evacuating this somewhat.  The fistula was then ligated and the  4 x 7 mm graft was spatulated and reanastomosed to the same opening in  the brachial artery with 6-0 Prolene.  Following this, the 7-mm  endograft was spatulated and anastomosed end to side to the brachial  vein with 6-0 Prolene, clamps released, and there was an  excellent pulse and a thrill in the graft.  No heparin or protamine was  given.  The wounds were irrigated with saline, closed in layers with  Vicryl in subcuticular fashion.  Sterile dressing applied.  The patient  was taken to recovery room in satisfactory condition.      Quita Skye Hart Rochester, M.D.  Electronically Signed  JDL/MEDQ  D:  09/13/2008  T:  09/14/2008  Job:  [id number]

## 2011-02-11 NOTE — Assessment & Plan Note (Signed)
OFFICE VISIT   [name], [name]  DOB:  [birth year]                                       11/15/2009  [id number]#:[id number]   I saw the patient in the office today concerning a painful right index  finger.  He states that approximately 3 weeks ago he had developed some  swelling in the distal aspect of his right index finger.  However, his  symptoms may have begun prior to this as Dr. Myra Gianotti had seen the  patient on 10/01/2009 at which time he had some ischemic changes to the  right index finger with no evidence of infection at that time.  He has  had continued pain in the index finger and therefore was sent for a  followup visit.  He does not remember any specific injury to the finger  but states that despite using antibiotic ointment and taking doxycycline  there has been no significant change in the swelling in his right index  finger.   This patient had a right radiocephalic fistula placed by Dr. Arbie Cookey on  10/16/2008.  On 02/05/2009 Dr. Arbie Cookey revised the arterial anastomosis.  At that time he noted extensive atherosclerotic disease in the radial  artery and basically extended the arteriotomy and revised the arterial  anastomosis by sewing a longer AV anastomosis at the level of the radial  artery.  He subsequently had a fistulogram on 08/14/2009 which showed a  patent fistula with some mild narrowing of the radial artery adjacent to  the anastomosis.  Of note, prior to this fistula the patient had used an  upper arm fistula on the left for many years.   REVIEW OF SYSTEMS:  He has had no recent fever or chills.   PHYSICAL EXAMINATION:  General:  This is a pleasant 45 year old  gentleman who appears his stated age.  Vital signs:  He does not have a  fever currently in the office today.  Lungs:  Clear bilaterally to  auscultation.  He has a palpable thrill in his right forearm AV fistula  with an audible bruit.  He has a diminished radial pulse.  He has  a  reasonable palmar arch signal with the Doppler which does not especially  augment with compression of his fistula.  In fact it appears that the  palmar arch signal is for the most part dependent upon the ulnar artery.  The signal disappears with compression of the ulnar artery but is  maintained with compression of the radial artery.   The tip of the right index finger is swollen and currently there is no  significant drainage but it clearly looks to be infected.  I have  written a prescription for p.o. Keflex and will arrange for him to see  Dr. Amanda Pea to evaluate this finger infection.  I have explained that  although he has a fistula in the right forearm which certainly could  compromise the circulation of the hand compression of the fistula does  not appear to have a significant impact on the circulation to the hand  which appears to be dependent more on the ulnar artery.  For this reason  I think it is worth having him see Dr. Amanda Pea before considering  ligation of the fistula to improve circulation in the hand although  ultimately this may become necessary.  I have instructed him also to  soak the hand in lukewarm Dial soap soaks to help keep the wound dry and  to begin taking his antibiotics.  After he has been evaluated by Dr.  Amanda Pea we would certainly be willing to reevaluate him for ligation of  his fistula if necessary.     Di Kindle. Edilia Bo, M.D.  Electronically Signed   CSD/MEDQ  D:  11/15/2009  T:  11/16/2009  Job:  [id number]   cc:   Dionne Ano. Amanda Pea, M.D.

## 2011-02-11 NOTE — Assessment & Plan Note (Signed)
OFFICE VISIT   [name], [name]  DOB:  Nov 20, 1965                                       09/20/2008  [id number]#:[id number]   The patient presents today for followup of removal of his left arm AV  graft on 12/20 at [facility].  He had had placement of a new  graft by Dr. Hart Rochester on 12/16.  He presented to the ER on 12/19 with  markedly swollen arm.  Old venogram revealed stent across his cephalic  vein into his subclavian vein with venous hypertension.  He was taken to  the operating room on Sunday morning and underwent removal of his graft.  His has marked reduced swelling and pain on today's examination.  He  does have a Diatek catheter which he is having successful hemodialysis  through.  He is scheduled for 10/09/2008 for placement of a right arm AV  fistula.  He underwent vein mapping today which showed a moderate sized  cephalic vein in his wrist.  We will determine if he is a fistula  candidate versus a graft candidate at the time of surgery.  This was  discussed at length with the patient who understands.   Larina Earthly, M.D.  Electronically Signed   TFE/MEDQ  D:  09/20/2008  T:  09/21/2008  Job:  [id number]

## 2011-02-11 NOTE — Assessment & Plan Note (Signed)
OFFICE VISIT   [name], [name]  DOB:  Jul 06, 1966                                       03/28/2009  [id number]#:[id number]   The patient presents today for poor flow in a right arm AV fistula.  This fistula was previously placed by Dr. Arbie Cookey in January of 2010.  It  was then revised in May of 2010.  He states that they have been  cannulating this some but he has had poor flow on dialysis.  He also  occasionally has some pain in his forearm and hand when he raises his  hand above his head.   PHYSICAL EXAM:  Blood pressure is 173/100 in the right lower extremity.  Pulse is 89 and regular.  On exam he has a palpable thrill and audible  bruit in a right radiocephalic AV fistula.  Hand is warm and well-  perfused.   I believe the best option for the patient would be a fistulogram to see  if there is anything further we can do to his fistula to improve the  flow.  If there are no further revisions possible we will need to  consider most likely placing an AV graft since the cephalic vein was  fairly small in the upper arm on this side.  The patient will return  next week after his fistulogram.   Janetta Hora. Fields, MD  Electronically Signed   CEF/MEDQ  D:  03/30/2009  T:  03/30/2009  Job:  [id number]   cc:   Terrial Rhodes, M.D.

## 2011-02-11 NOTE — Op Note (Signed)
NAMETHERESA, [name]          ACCOUNT NO.:  [id number]   MEDICAL RECORD NO.:  [id number]          PATIENT TYPE:  AMB   LOCATION:  SDS                          FACILITY:  [facility]   PHYSICIAN:  Larina Earthly, M.D.    DATE OF BIRTH:  01/11/1966   DATE OF PROCEDURE:  DATE OF DISCHARGE:  02/05/2009                               OPERATIVE REPORT   PREOPERATIVE DIAGNOSES:  End-stage renal disease with stenosis and  arterial anastomosis of right wrist Cimino arteriovenous fistula.   POSTOPERATIVE DIAGNOSES:  End-stage renal disease with stenosis and  arterial anastomosis of right wrist Cimino arteriovenous fistula.   PROCEDURE:  Revision of arterial anastomosis of right wrist  arteriovenous fistula.   SURGEON:  Larina Earthly, MD   ASSISTANT:  Nurse.   ANESTHESIA:  LMA.   COMPLICATIONS:  None.   DISPOSITION:  Recovery room, stable.   PROCEDURE IN DETAIL:  The patient was taken to the operating room and  placed in supine position.  The area of the right wrist and right arm  were prepped and draped in the usual sterile fashion.  Incision was made  over the prior scar and carried down to isolate the arterial venous  anatomosis.  The patient did have extensive arthrosclerotic changes of  the radial artery and did have an adequate pulse.  Preoperative sonogram  had shown stenosis at the arterial venous anastomosis.  The vein itself  was quite large above the arterial venous anastomosis.  The radial  artery was exposed further proximally.  The old AV fistula was ligated  with 2-0 silk tie at the venous anastomosis.  The radial artery was  occluded proximally and distally and was opened with 11 blade and  extended longitudinally with Potts scissors above the prior anastomosis.  The vein was divided and was spatulated and sewn end-to-side to the  radial artery with the running 6-0 Prolene suture.  Clamps were removed  and good thrill was noted through the graft.  The wounds were  irrigated  with saline.  Hemostasis with electrocautery.  Wounds were closed with 3-  0 Vicryl in the subcutaneous and subcuticular tissues.  Benzoin and  Steri-Strips were applied.      Larina Earthly, M.D.  Electronically Signed     TFE/MEDQ  D:  02/05/2009  T:  02/06/2009  Job:  [id number]

## 2011-02-11 NOTE — Procedures (Signed)
VASCULAR LAB EXAM   INDICATION:  Followup of an AV fistula post angioplasty.   HISTORY:  Diabetes:  Yes.  Cardiac:  No.  Hypertension:  No.   EXAM:  Right upper extremity AV fistula.   IMPRESSION:  1. AV fistula appears patent with no evidence of thrombosis or      stenosis post angioplasty.  2. Small branch coming off proximal forearm measuring 0.20 cm.  3. See drawing for all diameter measurements.   ___________________________________________  V. Charlena Cross, MD   CJ/MEDQ  D:  10/02/2009  T:  10/03/2009  Job:  [id number]

## 2011-02-11 NOTE — Assessment & Plan Note (Signed)
OFFICE VISIT   [name], [name]  DOB:  [birth year]                                       09/04/2008  ZOXWR#:[id number]   REASON FOR VISIT:  Evaluate for dialysis access.   HISTORY:  This is a 45 year old gentleman with end-stage renal disease  who I have been dialyzing through a left upper arm fistula which is now  thrombosed and could not be utilized.  He is now dialyzed through a  right-sided catheter.  Comes in the vein mapping and planning of his  next access.  He is right handed.   PHYSICAL EXAMINATION:  He is afebrile.  Hemodynamically stable.  General:  He is well appearing, no distress.  His left upper arm fistula  is thrombosed.  There is a thrill at its proximal extent.  Has palpable  radial pulse.   DIAGNOSTIC STUDIES:  The patient had vein mapping today.  He does not  have adequate vein for fistula.   ASSESSMENT/PLAN:  End-stage renal disease needing permanent access.   PLAN:  The patient will be scheduled for a left forearm AV Gore-Tex  graft.  He dialyzes Monday, Wednesday, Friday.  This is being scheduled  at his earliest convenience.  Risks and benefits were discussed  including infection, risk of steal.  He also understands the high  likelihood of a repeat intervention with his graft as compared to his  fistula.  We will schedule his surgery in the near future.   Jorge Ny, MD  Electronically Signed   VWB/MEDQ  D:  09/04/2008  T:  09/05/2008  Job:  [id number]   cc:   [facility]

## 2011-02-11 NOTE — Assessment & Plan Note (Signed)
OFFICE VISIT   [name], [name]  DOB:  1966/02/04                                       12/28/2009  ZOXWR#:[id number]   CHIEF COMPLAINT:  Followup infected tip of the right index finger,  status post right Cimino fistula approximately a year ago.   HISTORY OF PRESENT ILLNESS:  Patient is a 45 year old gentleman with a  history of end-stage renal disease and HIV who was referred to Dr.  Amanda Pea in orthopedic surgery for ischemic changes in his right index  finger.  The patient states that the wound is healing and getting  better.  It is much less tender.  There is no drainage noted.  The  patient had a right Cimino fistula placed in January of 2010 by Dr.  Arbie Cookey.  It was revised in May.  There was extensive atherosclerotic  disease noted at that time.  Dr. Amanda Pea felt that the patient had  calcified proper digital arteries which show up on x-ray, and he did not  feel that changing or ligating the fistula or __________ vascular  reconstruction distal to the PIP level would be effective and would  cause further complications.   PHYSICAL EXAMINATION:  This is a well-developed, well-nourished  gentleman in no acute distress.  Heart rate is 91.  Calf pressure is  143/85.  His respiratory rate was 10.  Right upper extremity:  He had a  good thrill and bruit in the AV fistula.  His hand was warm and pink.  The was a small 1 x 0.5 cm ulcer at the tip of the index, which is  clean.  There is no purulent drainage.  It is nontender at this time.  The patient states that the finger is doing much better with soaks and  antibiotics.   ASSESSMENT:  Ischemic area of the distal right index finger secondary to  calcified digital arteries.  There is no steal syndrome in the right  hand or upper extremity secondary to the arteriovenous fistula, which is  functioning well.   PLAN:  The patient states that he will be seeing Dr. Amanda Pea every 2  weeks.  He will continue  his antibiotics and finger soaks.  He can  follow up with [place] as needed.  The patient was advised to continue his  antibiotics, and we will see him for any issues relating to the AV  fistula.   Della Goo, PA-C   Larina Earthly, M.D.  Electronically Signed   RR/MEDQ  D:  12/28/2009  T:  12/28/2009  Job:  [id number]

## 2011-02-11 NOTE — Discharge Summary (Signed)
[name], [name] NO.:  [id number]   MEDICAL RECORD NO.:  [id number]          PATIENT TYPE:  INP   LOCATION:  6727                         FACILITY:  [facility]   PHYSICIAN:  [name], M.D.    DATE OF BIRTH:  1965-11-16   DATE OF ADMISSION:  08/25/2008  DATE OF DISCHARGE:  08/28/2008                               DISCHARGE SUMMARY   DISCHARGE DIAGNOSES:  1. Hypotension, fever, chills - consistent with sepsis, likely      secondary to hemodialysis catheter bacteremia, resolved on      discharge.  2. Increased alkaline phosphatase - mild cholecystitis on ultrasound,      alk phosphatase 746 on discharge.  3. Transaminitis - AST 56, ALT 63, within normal limits on discharge.  4. 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  5. HIV - diagnosis in June 2000.  CD-4 count in March 2009 = 200 with      VL = 427,000, followed by Dr. Cliffton [name].  6. 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  7. Tobacco abuse - currently using.  8. History of tertiary central nervous system syphilis.  9. History of medical noncompliance with HD, HIV medications, and      clinic visits (genotype resistant test did not show any resistant      mutations.  10.Anemia of chronic disease, normocytic - baseline hemoglobin 10 to      11.  11.Allergy to PENICILLIN - gives rash and itching.  12.Recent placement of PermCath (November 2009) - in the right chest      due to failure of left AV fistula.  13.Gastric ulcer - seen on EGD August 16, 2008.  14.History of polysubstance abuse (alcohol, cocaine, THC).   DISCHARGE MEDICATIONS:  1. Kaletra 200/50 mg tablet take 2 tablets by mouth twice  daily.  2. Viread 300 mg tablet every Monday and Thursday, take 1 tablet.  3. Epivir take 2.5 mL by mouth once daily.  4. Septra 800/160 mg take 1 tablet Monday, Wednesday and Friday.  5. Omeprazole 40 mg tablet take 1 tablet 30 minutes before meals.  6. Avelox 400 mg tablet take 1 tablet once daily for 9 days.  7. Valtrex 500 mg tablet take 1 tablet Tuesday, Thursday, Saturday      with hemodialysis.  8. Vancomycin with hemodialysis Tuesday, Thursday and Saturday for 10      days.  9. Nephro-Vite tablet take 1 tablet once daily.  10.Aranesp - per renal.   DISPOSITION AND FOLLOWUP:  The patient was discharged from the unit in  stable condition, afebrile, normotensive and back at his baseline.  He  will follow up in outpatient clinic with Dr. Orvan Falconer in 2 weeks.  He  will also continue with hemodialysis Tuesday, Thursday and Saturday and  will  continue to take vancomycin for a total of 10 days' treatment along  with Avelox antibiotic for a total of 9 more days.  Please assess if the  patient is compliant with HIV medications and if compliant with  hemodialysis treatment as well.  Also, please assess if he has completed  treatment with Avelox.  Also assess abstinence from alcohol and smoking.  Also please address if penile discharge resolved, since during the  hospitalization one concern was penile discharge. Also please reassess  liver function tests since during the hospitalization, alkaline  phosphatase elevated (746) and abdominal ultrasound only showed possible  mild cholecystitis.   PROCEDURES PERFORMED:  1. August 24, 2008 CXR:  Basilar atelectasis and crowding of      peripheral markings, questionable early right lower lobe      infiltrate.  2. August 26, 2008 CT of the abdomen:  Negative for liver abscess,      decompressed gallbladder noted, retroperitoneal lymphadenopathy,      bibasilar consolidation.  3. August 26, 2008 CXR:  Bilateral lower lobe infiltrates,  left      worse than right/atelectasis (improving from previous chest x-ray).  4. August 28, 2008 abdominal ultrasound:  Cholelithiasis with mild      gallbladder wall thickening, trace pericholecystic fluid, some      tenderness over the gallbladder during the exam, questionable mild      cholecystitis.   CONSULTATIONS:  1. Dr. Orvan Falconer, infectious disease department  2. Dr. Caryn Section, renal.   HISTORY OF PRESENT ILLNESS:  The patient is 45 year old male with past medical history of HIV with CD-  4 count in March 2009 of 200 and viral load 427,000, end-stage renal  disease (secondary to HIV nephropathy), and medical noncompliance,  transferred from [facility] to [facility] for continuation of  hemodialysis.  He was initially admitted to [facility] with weakness,  fever, chills, nonbloody vomiting that started shortly after starting  hemodialysis.  In addition, the patient was found to be hypotensive.  The patient was given fluids at [facility] and has responded well to  resuscitation (blood pressure 65/45 initially and increased to 90/60).  In addition, the patient reported white penile discharge that started 2  to 3 days prior to admission that has been improving, it is nonpainful  and no ulcerations noted.  The patient denies abdominal or urinary  symptoms or concerns, no chest pain, no shortness of breath no headaches  no cough.   PHYSICAL EXAM:  VITALS:  BP = 101/56, T = 98.8, R = 22, P = 84, Sat = 100% on room air.   GENERAL:  The patient was sitting in bed, does not appear to be in acute  distress.  HEENT:  Head atraumatic.  Extraocular movements intact chronic  conjunctival injection, muddy sclera, bilateral arcus senilis, no  stigmata of oropharyngeal infection, no oral thrush.  NECK:  Supple.  No tenderness to palpation.  No stiffness.  Full range  of motion  RESPIRATORY:  Breath sounds decreased at right base, otherwise clear to  auscultation bilaterally.  No wheezes,  rhonchi, rales or crackles.  CARDIOVASCULAR:  Regular rate and rhythm no murmurs, rubs or gallops.  No JVD and no carotid bruits.  ABDOMEN:  Soft, nondistended, slightly tender in right upper quadrant.  Bowel sounds present.  No guarding, no rebound tenderness.  Negative  Murphy sign.  EXTREMITIES:  No edema.  No cyanosis.  Moving all 4 extremities equally  well.  Left arm AV fistula.  LYMPHATICS:  No palpable lymphadenopathy.  MUSCULOSKELETAL:  No joint stiffness or tenderness.  NEUROLOGIC:  The patient is alert, oriented x3.  Strength 5/5 throughout  bilaterally.  No focal neurologic deficits.   LABS:  Na = 134, K = 4, Cl = 102, HCO3 = 22, BUN 29, Cr = 8.94, Glu = 86  WBC = 8.3, Hg = 9, Plt = 123,000, Alb = 2.2, Ca = 8.6, Bili = 0.7.   HOSPITAL COURSE BY PROBLEM:  1. Hypotension, fever and chills - consistent with sepsis.  The      patient was initially resuscitated with IV fluids as well as low-      dose dopamine and has responded well to treatment with blood      pressure eventually increasing to 110/70s.  Etiology likely related      to bacteremia secondary to hemodialysis catheter which was recently      placed, in addition to clotted left arm fistula.  Broad spectrum      antibiotics started, vancomycin, and will continue for a total of      10 days treatment once the patient discharged from the unit (will      be given with hemodialysis).  Right lower lobe pneumonia also      possibly contributing and seen on chest x-ray on admission. In      addition, the patient had some decreased breath sounds at right      base.  Avelox was started,  as well as Bactrim for PCP prophylaxis.      Other etiologies considered and subsequently ruled out, such as      disseminated GC infection given the patient's recent onset of      penile discharge, urine GC/chlamydia negative.  Blood cultures were      negative x2.  However, possibly falsely negative due to recent      intermittent use of  antibiotics on August 21, 2008 for MSSA      impetigo.  The patient was discharged from the unit normotensive,      febrile and back at his baseline.   1. Increased alkaline phosphatase (620 on admission).  On physical      exam the patient had mild right upper quadrant tenderness.      However, no evidence of cholecystitis.  Initial concern was for      infiltrative liver process such as MAC, lymphoma, or abscess.  On      CT of the abdomen, decompressed liver and retroperitoneal      lymphadenopathy noted with no liver abscess.  Abdominal ultrasound      showed cholelithiasis with questionable mild cholecystitis as well      as right upper quadrant tenderness during the exam.  Repeat LFTs      showed alkaline phosphatase at 746.  GGT 6033.  The patient will      follow up as an outpatient.   1. Transaminitis AST and ALT increased initially.  AST 56, ALT 63.      Hepatitis panel negative.  On discharge, within normal limits.   1. 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  The patient      started hemodialysis in June 2000 and has history of noncompliance      with treatment.   1. HIV diagnosed in June 2000.  Most recent CD-4 count 110 and viral      load 1,200,000 November 2009.  The patient will continue with HIV  medications.   1. History of HSV and condylomas - the patient presented with      nonpainful penile discharge with no ulcerations.  Urine GC and      chlamydia negative.  RPR negative.   1. Anemia of chronic disease.  Normocytic.  Baseline hemoglobin 10 to      11, the patient will continue with EPO treatment per renal      recommendations.   DISCHARGE VITALS AND LABS:  T = 98.1, BP = 132/85, P = 78, R = 20, Sat = 99% on room air.  Na = 142, K = 4.8, Cl = 107, HCO3 = 22, BUN 41, Cr = 9.03, Glu = 49  WBC = 3, Hg = 10.8, Plt = 115,000  Alkaline phosphatase 746, AST 44, ALT 38, Albumin 2.2, T bili 1.3.   Over 30 minutes spent on discharge.       Caleb Sax, MD  Electronically Signed      [name], M.D.  Electronically Signed    IM/MEDQ  D:  09/03/2008  T:  09/03/2008  Job:  [id number]   cc:   Cliffton [name], M.D.

## 2011-02-11 NOTE — Assessment & Plan Note (Signed)
OFFICE VISIT   [name], [name]  DOB:  05-15-1966                                       10/01/2009  ZOXWR#:[id number]   This is a 43-year gentleman with end-stage renal disease.  He had a  fistula placed by Dr. Arbie Cookey in January 2010.  The arterial anastomosis  was then revised in May 2010.  He has recently had an angioplasty of the  arterial anastomosis as well as the cephalic vein.  This was done on  November 16th.  They had not tried access this fistula for a month and  half.   PHYSICAL EXAMINATION:  Vital signs:  Heart rate is 77.  Blood pressure  is 119/76.  Respirations are 20.  He is well-appearing in no distress.  HEENT is normal.  Cardiovascular:  Regular rate and rhythm.  Extremities:  He has some ischemic changes to the right index finger  with antibiotic ointment in place.  No evidence of infection.  He has a  thrill within his right forearm AV fistula.   DIAGNOSTIC STUDIES:  An ultrasound was performed today which shows no  evidence of arterial stenosis and no side branches.   PLAN:  At this point, I feel like we need to attempt to re-access his  fistula.  It does appear to be of adequate diameter.  If, however, he  cannot dialyze through this fistula, it would need to be converted to a  forearm graft.  His previous vein mapping revealed that he has thrombus  within his upper arm cephalic vein.  One other possibility would be to  consider basilic vein transposition.  He will attempt to retry his  dialysis and follow up if needed.     Jorge Ny, MD  Electronically Signed   VWB/MEDQ  D:  10/01/2009  T:  10/03/2009  Job:  [id number]   cc:   Mindi Slicker. Lowell Guitar, M.D.

## 2011-02-11 NOTE — Op Note (Signed)
[name], Caleb          ACCOUNT NO.:  [id number]   MEDICAL RECORD NO.:  [id number]          PATIENT TYPE:  OBV   LOCATION:  2550                         FACILITY:  [facility]   PHYSICIAN:  Larina Earthly, M.D.    DATE OF BIRTH:  1966/09/20   DATE OF PROCEDURE:  09/17/2008  DATE OF DISCHARGE:                               OPERATIVE REPORT   PREOPERATIVE DIAGNOSIS:  Venous hypertension of left arm with a new left  arm arteriovenous Gore-Tex graft.   POSTOPERATIVE DIAGNOSIS:  Venous hypertension of left arm with a new  left arm arteriovenous Gore-Tex graft.   PROCEDURE:  Removal of left forearm loop arteriovenous Gore-Tex graft.   SURGEON:  Larina Earthly, MD   ASSISTANT:  Nurse.   ANESTHESIA:  LMA.   COMPLICATIONS:  None.   DISPOSITION:  To recovery room stable.   PROCEDURE IN DETAIL:  The patient was taken to the operating room,  placed in supine position, there the left arm prepped and draped in a  sterile fashion.  The patient had some blistering over the antecubital  wound.  The patient had a markedly swollen left arm.  The antecubital  incision was opened.  The patient had had a subcuticular closure when  the Vicryl sutures were removed.  The Gore-Tex graft to arterial  anastomosis was exposed and was occluded just below the arterial  anastomosis.  The venous limb of the graft was occluded as well.  The  graft was divided near the arterial anastomosis and a cuff of graft was  left intact with the arterial anastomosis and the graft was oversewn  with two layers of 6-0 Prolene suture.  Next, the venous anastomosis was  exposed.  The vein was occluded proximal and distal, end was ligated  proximal and distal to the venous anastomosis with 2-0 silk ties.  The  graft was removed from the venous anastomosis.  The graft was then  removed from the tunnel.  The graft had been placed 4 days prior and was  easily removed from the tunnel.  The wounds were irrigated with  saline.  Hemostasis achieved with cautery.  A 2-0 Vicryl suture was used in the  subcutaneous tissue to close the subcutaneous tissue over the arterial  anastomosis due to the blistering of the skin.  The skin itself was  closed with 3-0 nylon mattress sutures.  Kerlix and Ace wrap was applied  and the patient was taken to the recovery room in stable condition.      Larina Earthly, M.D.  Electronically Signed    TFE/MEDQ  D:  09/17/2008  T:  09/17/2008  Job:  [id number]

## 2011-02-11 NOTE — Op Note (Signed)
NAMECHRISTORPHER, [name]          ACCOUNT NO.:  [id number]   MEDICAL RECORD NO.:  [id number]          PATIENT TYPE:  AMB   LOCATION:  SDS                          FACILITY:  [facility]   PHYSICIAN:  Larina Earthly, M.D.    DATE OF BIRTH:  1966/05/15   DATE OF PROCEDURE:  10/16/2008  DATE OF DISCHARGE:                               OPERATIVE REPORT   PREOPERATIVE DIAGNOSIS:  End-stage renal disease.   POSTOPERATIVE DIAGNOSIS:  End-stage renal disease.   PROCEDURE:  Right Cimino arteriovenous fistula creation.   SURGEON:  Larina Earthly, MD   ASSISTANT:  Jerold Coombe, PA-C   ANESTHESIA:  LMA.   COMPLICATIONS:  None.   DISPOSITION:  To recovery, stable.   PROCEDURE IN DETAIL:  The patient was taken to the operating room and  placed in the supine position where the right arm and right wrist were  prepped and draped in the usual sterile fashion.  Incision was made  between the level of cephalic vein and the radial artery at the wrist.  Cephalic vein was of good caliber.  Tributary branches were ligated with  3-0 and 4-0 silk ties and divided.  The vein was ligated distally with a  2-0 silk tie and then was divided and mobilized at the level of the  radial artery.  The radial artery was of good caliber and did have  moderate calcification.  The artery was occluded proximally and distally  and was opened with an 11 blade, sewn with Potts scissors.  The vein was  cut to the appropriate length, was spatulated, and sewn end-to-side to  the artery with a running 6-0 Prolene suture.  Clamps were removed and  good thrill was noted.  Wounds were irrigated with saline.  Hemostasis  was obtained with electrocautery.  The wounds were closed with 3-0  Vicryl in the subcutaneous and subcuticular tissue.  Benzoin and Steri-  Strips were applied.      Larina Earthly, M.D.  Electronically Signed     TFE/MEDQ  D:  10/16/2008  T:  10/16/2008  Job:  [id number]

## 2011-02-11 NOTE — H&P (Signed)
NAMECORRY, [name] NO.:  [id number]   MEDICAL RECORD NO.:  [id number]          PATIENT TYPE:  EMS   LOCATION:  ED                           FACILITY:  [facility]   PHYSICIAN:  Maryla Morrow, MD        DATE OF BIRTH:  [birth year]   DATE OF ADMISSION:  08/24/2008  DATE OF DISCHARGE:                              HISTORY & PHYSICAL   PRIMARY CARE PHYSICIAN:  Unassigned.   CHIEF COMPLAINT:  Fever.   HISTORY OF PRESENT ILLNESS:  [name] is a 45 year old African  American gentleman with history of HIV, end-stage renal disease  requiring hemodialysis, as well as remote history of herpes,  Pneumocystis carinii pneumonia and condylomata infection and also remote  history of hypertension, not on medication at this time, who comes into  the [facility] ED today with chief complaint of having fever with  chills after finishing dialysis today.  The patient was found to be  hypotensive in 60s in the ED today.  The patient denied any sick  contacts, any cough, congestion, chest pain, or shortness of breath.  The patient denies any diarrhea or constipation.  The patient says he  has recently started taking his HIV medications in a more compliant  fashion.   PAST MEDICAL HISTORY:  1. Significant for HIV.  2. End-stage renal disease, requiring hemodialysis.  3. 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  4. History of anemia of chronic disease.  5. History of recurrent left saphenous vein stenosis with angioplasty      and left upper arm AV fistula which has failed.  Currently, he has      a right-sided Vas-Cath.  6. History of tobacco abuse.  7. History of medical noncompliance.  8. History of left upper lobe pneumonia.  9. History of left shoulder calcific bursitis.  10.History of chronic sinusitis.  11.History of abdominal abscess incision and drainage.   HOME MEDICATIONS:  The patient currently seems to be  on:  1. Kaletra twice daily.  2. Epivir 1 tablet daily.  3. Valtrex 1 tablet Tuesday, Thursday, and Saturday.  4. Viread 1 tablet Monday and Thursday.   ALLERGIES:  The patient is allergic to PENICILLIN and TYLENOL NO. 3.   SOCIAL HISTORY:  The patient is currently married.  He has 2 sons.  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  Denies alcohol or drug abuse.   FAMILY MEDICAL HISTORY:  Significant for diabetes in mother, otherwise  negative.   REVIEW OF SYSTEMS:  All pertinent positives mentioned in the HPI.  Rest  is negative per patient.  Complete 12-point review of systems performed.   PAST SURGICAL HISTORY:  As in medical history.   PHYSICAL EXAMINATION:  GENERAL: A 45 year old [place] American male,  currently in no apparent acute distress, alert and oriented to time,  place, and person.  VITAL SIGNS: Temperature of 99 on admission with a blood pressure of  65/43 on admission, now up to 100/54 after fluid resuscitation.  His  pulse rate was somewhere from 94 to 105 per minute, respirations 20 per  minute, and saturation 100% on room air.  HEENT: Pupils equal, round, and reactive to light.  Extraocular  movements intact.  Head is atraumatic, normocephalic.  Oropharyngeal is  clear.  NECK:  No JVD.  No lymphadenopathy.  CHEST: Clear to auscultation bilaterally.  Equal expansion.  HEART: Regular rate and rhythm, tachycardic.  ABDOMEN:  Soft and nontender.  EXTREMITIES: No edema, cyanosis, or clubbing.  Deep tendon reflexes are  2+ bilaterally.  Strength is 5/5 throughout.  Sensory intact.  speech  normal.  SKIN: A Vas-Cath in the right upper chest as well as AV fistula on the  left arm.   LABORATORY DATA AND DIAGNOSTIC TESTS:  A chest x-ray, portable view,  reveals basilar atelectasis, however cannot exclude right lower lobe  infiltrate.  White count is 12.9, hemoglobin 12.5, neutrophils 54%, and  platelets 120,000.  Sodium 137, potassium  4, chloride 99, and creatinine  7.8.   ASSESSMENT AND PLAN:  A 45 year old [place] American male with:  1. Systemic inflammatory response syndrome, rule out sepsis, probably      secondary to community-acquired pneumonia versus bacteremia.  2. Human immunodeficiency virus with medical noncompliance.  The      patient states that he is currently compliant with his medication      and otherwise denies to know whether he has a low CD4 count and      does not know his viral load.  3. End-stage renal disease requiring hemodialysis.  4. History of herpes, Pneumocystis carinii and condylomata infection.  5. History of anemia of chronic disease.  6. Tobacco abuse.   RECOMMENDATIONS:  1. To place patient on IV vancomycin every 4 to 7 days based on the      trough levels as this is a hemodialysis patient.  We also placed      him on Avelox for community-acquired pneumonia as he is allergic to      PENICILLIN.  We will, however, avoid Bactrim at this point unless      PCP is suspected and he is hypoxic will check LDH and monitor his      oxygen status.  2. Resume his HIV meds.  3. P.R.N. medication.  4. We will check CD4 and viral load.  5. We will check a rapid flu test.  The patient states that he did not      receive his swine flu vaccine this winter, though he did receive      his regular flu shot.      Maryla Morrow, MD  Electronically Signed     AP/MEDQ  D:  08/25/2008  T:  08/25/2008  Job:  [id number]

## 2011-02-11 NOTE — Procedures (Signed)
CEPHALIC VEIN MAPPING   INDICATION:  Evaluation prior to placement of dialysis access.  Previous  left upper extremity AV fistula placed in November, 2000 and revised on  October 16, 2000.   HISTORY:   EXAM:   The right cephalic vein is not compressible.   Diameter measurements range from 0.13 to 0.37 cm.   The left cephalic vein is not evaluated.   See attached worksheet for all measurements.   IMPRESSION:  1. Partially compressible right cephalic vein, which is not of      acceptable diameter for use as a dialysis access site.  2. Chronic thrombus and vein-wall thickening is seen throughout the      right cephalic vein.   ___________________________________________  V. Charlena Cross, MD   MC/MEDQ  D:  09/04/2008  T:  09/04/2008  Job:  [id number]

## 2011-02-11 NOTE — Op Note (Signed)
NAMEDIMETRI, ARMITAGE          ACCOUNT NO.:  [id number]   MEDICAL RECORD NO.:  [id number]          PATIENT TYPE:  AMB   LOCATION:  SDS                          FACILITY:  [facility]   PHYSICIAN:  Larina Earthly, M.D.    DATE OF BIRTH:  [birth year]   DATE OF PROCEDURE:  DATE OF DISCHARGE:                               OPERATIVE REPORT   ADDENDUM   [name] had a right Cimino AV fistula creation.   SURGEON:  Larina Earthly, MD   ASSISTANT:  Jerold Coombe, P.A.      Larina Earthly, M.D.  Electronically Signed     TFE/MEDQ  D:  10/16/2008  T:  10/16/2008  Job:  [id number]

## 2011-02-11 NOTE — Assessment & Plan Note (Signed)
OFFICE VISIT   GULBRANSON, [name]  DOB:  [birth year]                                       11/10/2008  AVWUJ#:[id number]   Patient presents today for followup of his right wrist Cimino AV fistula  creation on 10/16/08.  He has done well since surgery.  He has excellent  maturation of his fistula, now approximately 3 weeks out.  His fistula  incision is healing without difficulty.   He does have an IJ Diatek catheter on the right, which is functioning  without difficulty.  I have instructed him to continue the exercise of  his hand for the fistula.  He will have continued dialysis of his venous  catheter for at least 2 months out from the procedure and hopefully 3  months to allow final maturation.   He did have some irritation in his left antecubital and in fact had 2  nylon mattress sutures still in place.  These were removed in the office  today.  He will see [place] again on an as-needed basis.   Larina Earthly, M.D.  Electronically Signed   TFE/MEDQ  D:  11/10/2008  T:  11/13/2008  Job:  [id number]   cc:   Terrial Rhodes, M.D.

## 2011-02-11 NOTE — Assessment & Plan Note (Signed)
OFFICE VISIT   [name], [name]  DOB:  07/30/1966                                       01/26/2009  ZOXWR#:[id number]   The patient comes in today for continued follow up of his right AV  fistula. He had this fistula placement by myself on 10/16/2008.  He was  seen in our office for follow up on 11/10/2008 at which time he had good  early maturation of the fistula.  He has had several attempts at access  now and had poor flow.  He underwent fistulogram in the radiology  department on 01/08/2009 and I have reviewed this with the patient.  He  does have a good size cephalic vein throughout his forearm but on  physical examination, there feels to be poor inflow into the vein.  On  his venogram, he does have reflux from the vein into the radial artery  but there is a significant stenosis at the anastomosis.  I feel that  this is the cause for slow and poor maturation.  He does have a Diatek  catheter which is functioning well. I have recommended that he undergo  revision of the arterial anastomosis and discussed the procedure with  the patient.  He understands and wishes to defer this until after  Mother's Day on May 9.  We have therefore scheduled surgery for Feb 05, 2009 for revision of his right AV fistula.   Larina Earthly, M.D.  Electronically Signed   TFE/MEDQ  D:  01/26/2009  T:  01/29/2009  Job:  [id number]   cc:   Terrial Rhodes, M.D.

## 2011-02-11 NOTE — Assessment & Plan Note (Signed)
OFFICE VISIT   CORIGLIANO, Rayburn E  DOB:  03-27-1966                                       05/31/2010  ZOXWR#:[id number]   This is an established patient vascular surgery consultation.  This  constipation is being requested for possible evaluation of right  arteriovenous fistula.   HISTORY OF PRESENT ILLNESS:  This is a 45 year old gentleman that  previously has had a right radiocephalic arteriovenous fistula placed on  10/06/2000.  He then required revision of the arterial anastomoses on  02/05/2009, has had difficulties with maintaining flow rate over the  last day.  By report he was to be referred to radiology, but due to  miscommunication did not show up at the radiology appointment on  Wednesdayfor his fistulogram and then was subsequently referred to  vascular surgery for evaluation.  Currently, he notes that he was told  that during his last dialysis there were difficulties with flow rate.  He denies any current steal symptoms or any neurologic complaints in  this right hand.  His previous procedures included a right radiocephalic  fistula which then required previous angioplasty in May and then a  revision of the arterial anastomosis.  He has had multiple catheters  placed in the right internal jugular position.  Also he has had a left  arm arteriovenous graft placed previously and a left brachiocephalic  arteriovenous fistula placed that eventually required ligation.   PAST MEDICAL HISTORY:  1. End-stage renal disease.  2. HIV.  3. Disseminated MAC.  4. Tertiary syphilis.  5. Genital herpes.  6. Anal condyloma  7. Secondary hyperparathyroidism.  8. Hypertension.  9. Right knee degenerative joint disease.  10.PCP pneumonia.  11.Disseminated HSV  12.History of left upper lobe pneumonia.  13.Gastritis and duodenitis.   PAST SURGICAL HISTORY:  1. The previously noted access procedures.  2. Abdominal abscess incision and drainage.  3.  Previous skin biopsy.  4. Multiple angioplasties of a left saphenous vein.   SOCIAL HISTORY:  Single, unemployed.  He does smoke a half-a-pack a day.  The exact duration is unknown.  He denies any ethanol or illicit drug  use currently.   FAMILY HISTORY:  Noncontributory.  He does not know exactly the medical  history of his mother or father.   REVIEW OF SYSTEMS:  Notes weight gain, dizziness, change in eyesight,  sore throat, pain with legs or walking, arthritis, joint pain, muscle  pain, shortness of breath with exertion, reflux, trouble swallowing,  diarrhea, constipation, and kidney disease but otherwise the rest of his  12 point review of system was noted on his chart to be negative.   MEDICATIONS:  Include Nephro-Vite, Valtrex, Prilosec, Epivir, Viread,  Bactrim, Restoril, albuterol, and Kaletra.   ALLERGIES:  Penicillin and Tylenol with codeine.  He has a documented  contrast allergy for which he gets hives.   PHYSICAL EXAMINATION:  Vital signs:  Temperature 98.2, left calf  pressure 112/86, heart rate 105, respirations were 20.  General:  He appears somewhat cachectic in no apparent distress.  He  seemed a little agitated.  Head:  Normocephalic, atraumatic.  ENT:  Hearing was grossly intact.  Nasal drainage was not present.  Oropharynx demonstrated no erythema or exudate.  Neck was supple without  nuchal rigidity.  Eyes:  Pupils were equal, round, and reactive to light.  Extra movements  were intact.  Pulmonary:  Symmetric expansion.  Good air movement.  Clear to  auscultation bilaterally.  He had no rhonchi, rales or wheezes.  Cardiac:  Regular rate and rhythm.  Normal S1-S2.  No murmurs, rubs,  thrills or gallops.  Pulses:  Bilateral upper extremity and lower  extremity pulses were easily palpable.  There was no carotid bruit on  either side.  The abdominal aorta demonstrates no aneurysmal growth on  exam.  GI:  Soft abdomen, nontender, nondistended.  No guarding or  rebound.  No  palpable masses or hepatosplenomegaly.  Musculoskeletal:  Bilateral lower extremity and upper extremity exam  demonstrates 5/5 symmetric muscle strength.  Skin:  In the extremities, the right arm demonstrates a pulsatile right  arteriovenous fistula with no thrill.  On auscultation, there is a high-  pitched bruit present within this right arteriovenous fistula.  The left  arm demonstrates incisions consistent with the previously noted access  procedures.  Bilateral lower extremities demonstrate no gangrene,  ulcers, or any erythema.  No other rashes were noted on this patient's  body.  Neuro:  Cranial nerves II-XII were intact.  Motor strength as noted  above.  Bilateral hands demonstrate intact pinpoint and light touch.  Bilateral lower extremities grossly have intact sensation.  Psych:  The judgment was noted be intact.  The patient appears in terms  of mood and affect to be slightly anxious, which is appropriate with his  current situation.  Lymphatics:  No cervical, inguinal, or axillary  lymphadenopathy was noted.   MEDICAL DECISION MAKING:  This is a 45 year old gentleman with an  extensive access history, multiple active comorbidities, as extensively  listed.  He has a failing right arteriovenous fistula, based on exam.  At this point likely an outflow issue, as previously in May.  Based on  exam, I think this is near-occluded and occlusion of this AVF is  imminent.  I have tried to arrange to have vascular surgery perform a  fistulogram on the right arteriovenous fistula.  However, the peripheral  lab is no longer available, and the OR's currently are occupied with no  early release of an operating room expected.  I then contacted  interventional radiology, and they agreed to try to expedite a  fistulogram in this gentleman with possible intervention.  I discussed  the patient's options in this case, which included allowing the fistula  to thrombose and then  pursuing a new permanent access, but this would  require additional placement of a dialysis catheter.  He did not want  this option and preferred to attempt at a primary assisted procedure, so  due the nonavailability of the OR and the peripheral lab, I have  arranged to have interventional radiology attempt to successfully  perform a primary assisted patency procedure.     Leonides Sake, MD  Electronically Signed   BC/MEDQ  D:  05/31/2010  T:  06/04/2010  Job:  [id number]

## 2011-02-14 NOTE — Discharge Summary (Signed)
[facility]  Patient:    [name], [name]                 MRN: [id number] Adm. Date:  [id number] Disc. Date: [id number] Attending:  Toma Copier Dictator:   Lyndee Leo. Janey Greaser, M.D. CC:         [facility], Dr. Lady Saucier [facility], Dr. Orvan Falconer   Discharge Summary  CONSULTATIONS: 1. Dr. Johny Sax, infectious disease. 2. Dr. Chevis Pretty, [facility].  CHIEF COMPLAINT:  Nausea, vomiting and diarrhea along with abdominal pain.  HISTORY OF PRESENT ILLNESS:  This is a 45 year old, African-American male with above chief complaint for one week who had been running fevers from 102 to 103 degrees over the last week.  He has had no melena or hematemesis with decreased p.o.  The patient states that on the morning of admission, he had increasing abdominal pain and got to the point where he required to be seen at the emergency room.  PAST MEDICAL HISTORY:  1. 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  2. Human immunodeficiency virus followed by infectious disease, Dr. Orvan Falconer.  3. 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  4. Malnutrition.  5. Neurologic syphilis now requiring neuroleptic medication.  6. Status post appendectomy.  7. Greater than 20-pack-year smoker.  8. Hemorrhoids.  9. Genital herpes. 10. Migraine headaches. 11. 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 Zachery Dakins.  DISCHARGE DIAGNOSES: 1. Mycobacterium avium complex, status post abdominal abscess excision by    exploratory laparotomy. 2. Human immunodeficiency virus. 3. End-stage renal disease.  DISCHARGE MEDICATIONS:  1. Nephro-Vite one tablet p.o. q.d.  2. Calcium 500 mg four tablets three times a day with meals.  3. Biaxin 500 mg p.o.  q.d. for MAC and duration to be determined by     infectious disease.  4. Retrovir 100 mg p.o. q.8h.  This medication should be taken q.8h. while     the patient is on Biaxin because of pharmacologic interaction.  When     the patient is done with the Biaxin, Retrovir will be changed back to     normal dose of 100 mg p.o. b.i.d.  5. Kaletra 3 mg p.o. b.i.d.  6. Videx EC 125 mg p.o. q.d.  7. Ethambutol 1200 mg p.o. q.48h., duration to be determined by infectious     disease for his Mycobacterium avium illness.  Dosage of medication has     been determined by his hemodialysis status.  8. Restoril 15 mg p.o. q.h.s. p.r.n.  9. Neurontin 100 mg p.o. b.i.d. 10. Vitamin B6 50 mg p.o. q.d. 11. Anusol HC suppository b.i.d. 12. ketoconazole 2% cream b.i.d. to affected area p.r.n.  HOSPITAL COURSE:  The patient was admitted on December 31, 2000.  We had surgery consult because of abdominal exam and guarding.  The patient had a CT of his abdomen performed on December 31, 2000, which revealed hepatosplenomegaly and probable pleural hypertension.  There is a solid mass in the left aspect of the mesentery of indeterminate etiology, ascites, mesenteric and retroperitoneal adenopathy.  The patient was then scheduled for CT-guided biopsy by radiology of the mass to determine tissue.  The results of this biopsy revealed only a small portion of vascular wall fibrous tissue with mild inflammation and material was not  diagnostic.  At that point, discussion of repeat CT-guided biopsy versus exploratory laparotomy was had.  During CT-guided biopsy, there was quite a bit of postprocedural bleeding secondary to the vascularity of the mass and because of a risk of bleeding complication, it was determined that the patient would be taken to the OR where he could be more closely managed.  The patient went to the operating room on January 07, 2001, with a postop diagnosis of a mesenteric abscess.  Pathology from  abscess revealed positive AFB and further DNA probing revealed Mycobacterium avium complex.  Infectious disease, who had been following since onset of admission for HIV and abscess, narrowed down his pharmacologic treatment to ethambutol and Biaxin.  The patient did have a two-day delay of antibiotic treatment and when he was off his HIV medications, secondary to a postop ileus, this did clear with NG tube placement and drainage with the patient remaining NPO.  The patient was slowly titrated up on p.o. and at the time of discharge was eating full regular meals, ambulating without difficulty and had a benign abdomen. Of note, during entire hospitalization, the patient was maintained on his normal days of dialysis which was Tuesday, Thursday and Saturday.  He did have a new estimated dry weight of 63 kg which will probably be elevated as an outpatient with further lean muscle mass weight gain.  LABORATORY DATA AND X-RAY FINDINGS:  During the hospital course, the patient had a normal ALT and a slightly elevated AST ranging from 35 to 55.  Iron studies revealed an iron level of 49, TIBC of 151, percent saturation of 32 and a ferritin of 3029.  Toxoplasma antibody IgG was negative.  Toxoplasma antibody IgM was also negative.  Pathology from abdominal mass revealed benign soft tissue with marked chronic inflammation, granulation tissue and vague granulomas.  Polarization was negative.  Pertinent radiologic data with CT of the abdomen revealed hepatosplenomegaly and probable pleural hypertension with solid mass in the left aspect of the mesentery of indeterminate etiology.  There was mesenteric and retroperitoneal adenopathy.  Chest x-ray revealed mild cardiomegaly without congestive heart failure. There was peribronchial thickening, but no evidence of pneumonia.  FOLLOWUP:  Follow up with Dr. Chevis Pretty from [facility] in one  week for reevaluation of his exploratory  laparotomy.  The patient is to follow up in one week with Dr. Orvan Falconer of infectious disease for his HIV and Mycobacterium avium complex.  The patient is to continue with hemodialysis on Tuesday, Thursday, Saturday schedule.  He goes to [facility].  A copy of this discharge summary was faxed to the dialysis center. DD:  01/17/01 TD:  01/18/01 Job: [id number]

## 2011-02-14 NOTE — Assessment & Plan Note (Signed)
[facility]                           GASTROENTEROLOGY OFFICE NOTE   NAME:[name], [name]                 MRN:          [id number]  DATE:07/22/2006                            DOB:          17-Jun-1966    REASON FOR CONSULTATION:  Diarrhea.   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  I suspect medication is the most likely cause.  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  There is also a history of other  infections, including tertiary syphilis, genital herpes, and anal  condylomas.  He has days without diarrhea and it has been intermittent, so I  doubt an infection, though it certainly is possible.  There are no other  significant constitutional symptoms noted.  He says Dr. Orvan Falconer has  recommended Imodium, which helps some, but not consistently.   PLAN:  1. Lomotil 1 to 2 q.6 hours p.r.n. #90 prescribed.  2. Stool for C. diff toxin culture and Giardia/Cryptosporidium screen.  I      think he has done some of these in the past but has also been      noncompliant with it.  3. I would hold off on any type of sigmoidoscopy or colonoscopy unless Dr.      Orvan Falconer thought that that made sense, or if things became much worse      at this point.  I will send a letter to Dr. Orvan Falconer about this.   HISTORY:  A 45 year old white man with problems outlined above.  He has had  HIV disease for some years and has had mycobacterium avium complex, tertiary  syphilis, genital herpes, anal condyloma.  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  Has been on dialysis since 2000.  He describes  spells of diarrhea for months if not longer, particularly on non-dialysis  days, when he eats and drinks a lot of fluids, then he has watery urgent  stools.  It does not seem to be much of a nocturnal problem.  There is no  bleeding or fever, or significant abdominal pain, except some vague  periumbilical pain at times.  He tells me Dr. Orvan Falconer has told him to take  Imodium, which will help somewhat.  There has been no weight loss with this.   GI review of systems otherwise positive for some intermittent heartburn.   FAMILY HISTORY:  Notable for a grandfather with colon cancer, but no first  degree relatives.   PAST MEDICAL HISTORY:  1. As above.  2. Prior appendectomy.  3. Anemia of chronic disease.  4. Penicillin allergy.  5. Secondary hypoparathyroidism.  6. Chronic sinusitis.  7. Prior abdominal abscess incision and drainage, caused by mycobacterium      avium complex.  8. Rosacea.  9. Hypertension.  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  11.Right knee degenerative joint disease.  12.Probable pneumocystis pneumonia.  13.History of tobacco abuse.  14.Noncompliance.  15.Disseminated herpes.  16.Left upper lobe pneumonia.  17.Left shoulder calcific bursitis.   MEDICATIONS:  1. Kaletra 2 twice daily.  2. Epivir half teaspoon daily.  3. Valtrex daily.  4. Tums daily.  5. He is not taking his PhosLo.   ALLERGIES:  PENICILLIN.   SOCIAL HISTORY:  Married, 2 sons.  He has been a Naval architect.  He smokes.  No alcohol.   FAMILY HISTORY:  Diabetes in his mother.  Otherwise, as above.   REVIEW OF SYSTEMS:  See medical history form.   PHYSICAL EXAM:  Well-developed, somewhat chronically ill-appearing black  male.  Height 5 feet 11 inches.  Weight 165 pounds.  Blood pressure 116/70, pulse 80.  EYES:  Somewhat injected and bloodshot.  MOUTH:  Poor dentition.  Otherwise, free of oral mucosal lesions.  NECK:  Supple.  No thyromegaly or mass.  CHEST:  Clear.  HEART:  S1, S2.  No gallops or rubs.  ABDOMEN:  Soft and nontender.  There is a midline scar from  previous  surgery.  No organomegaly or mass.  RECTAL:  No significant stool.  Mildly tender.  There is some scaring and  some condylomata changes.  EXTREMITIES:  No peripheral edema.  SKIN:  Somewhat dry.  There is no acute rash noted.  He has some acne on the  face versus tinea barbae.  PSYCH:  He is alert and oriented x3.   I have reviewed the notes sent from Dr. Arrie Aran, as well as a note from  Dr. Delton Coombes in our [facility] chart.       Iva Boop, MD,FACG      CEG/MedQ  DD:  07/22/2006  DT:  07/23/2006  Job #:  [id number]   cc:   Cliffton [name], M.D.  Terrial Rhodes, M.D.

## 2011-02-14 NOTE — Op Note (Signed)
[place]  Patient:    [name], [name]                 MRN: [id number] Proc. Date: 01/07/01 Adm. Date:  [id number] Attending:  Toma Copier                           Operative Report  PREOPERATIVE DIAGNOSIS:  Abdominal mass.  POSTOPERATIVE DIAGNOSIS:  Mesenteric abscess.  OPERATION PERFORMED:  Exploratory laparotomy, lysis of adhesions, drainage of mesenteric abscess.  SURGEON:  Chevis Pretty, M.D.  ASSISTANT:  Adolph Pollack, M.D.  ANESTHESIA:  General endotracheal.  DESCRIPTION OF PROCEDURE:  After informed consent was obtained, the patient was brought to the operating room and placed on the supine position on the operating table.  After adequate induction of general anesthesia, the patients abdomen was prepped with Betadine and draped in the usual sterile manner.  Initially, a vertically oriented incision was made just above the umbilicus with a 15 blade knife.  This incision was carried down through the subcutaneous tissues to the linea alba with blunt dissection using the Kelly clamp and [employer] retractors.  The linea alba was then incised with a 15 blade knife and each side was grasped between Kocher clamps and elevated.  The preperitoneal space was probed bluntly until the peritoneum was opened and access was gained to the abdominal cavity.  A finger was inserted through this hole and immediate surrounding abdominal wall appeared to be free of adhesions.  A 0 Vicryl pursestring suture was then placed in the fascia around this hole and a Hasson cannula was placed through this hole into the abdominal cavity and anchored in place with the previously placed Vicryl pursestring stitch.  The abdomen was then insufflated with carbon dioxide and a laparoscope was placed through the Hasson cannula.  It obvious immediately that there were many adhesions to the abdominal wall throughout with a lot of free fluid that appeared to  be serous in nature.  The laparoscopic procedure was then aborted and the Hasson cannula was removed and the midline incision was made with a 10 blade knife.  This incision was carried down through the skin and subcutaneous tissues using the Bovie electrocautery until the linea alba was encountered.  This incision was then opened under direct vision with the Bovie electrocautery.  The intra-abdominal adhesions and adhesions to the abdominal wall were easily broken up by finger fracture technique. Approximately 2L of serous fluid was drained from the abdominal cavity.  There was some fibrous exudate on the bowel but no obvious succus or stool contamination of the belly.  The bowel was then run in its entirety from the ligament of Treitz to the ileocecal valve and the entire colon.  There was no evidence of perforation.  In the mesentery of the proximal jejunum there appeared to be a large abscess cavity that was encountered and gross purulence was drained from this area as it was opened.  There were no other sources identified of perforation or abscess and the abdomen was then irrigated with copious amounts of warm saline.  Two 72 French Blake drains were brought percutaneously through the abdominal wall and placed within the abscess cavities.  These drains were anchored to the skin using a 3-0 nylon stitch. The fascia was then closed with two running #1 PDS sutures.  Then the skin was closed with staples.  Sterile dressings were applied.    The  patient tolerated the procedure well.  At the end of the case all sponge, needle and instrument counts were correct.  The patient was awakened and taken to the recovery room in stable condition.  During the case, cultures were sent from the abscess cavity and a biopsy was taken of the abscess wall. DD:  01/11/01 TD:  01/11/01 Job: [id number]

## 2011-02-14 NOTE — Consult Note (Signed)
[place]  Patient:    [name], [name]                 MRN: [id number] Proc. Date: 12/31/00 Adm. Date:  [id number] Attending:  Toma Copier                          Consultation Report  HISTORY OF PRESENT ILLNESS:  [name] is a 45 year old black male who presents today complaining of fevers to 102.0 for the last 2-3 days. He also notes diarrhea for the last 2-3 weeks. He vomits in the mornings, a very small volume, but is able to tolerate solids and liquids during the day. His abdominal pain has been getting worse for the last couple of weeks. He has not noticed any blood with his diarrhea. He has not vomited up any blood. His other review of systems is unremarkable.  ALLERGIES:  PENICILLIN, TYLENOL #3, PERCOCET.  PAST MEDICAL HISTORY: 1. 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 He started    dialysis in January of 2000. 2. He is HIV positive and is followed by infectious disease. 3. He has disseminated Mycobacterium avium complex with his last    liver biopsy in June 2000. 4. He has malnutrition. 5. Central nervous system syphilis. 6. Hemorrhoids. 7. 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 8. Genital herpes. 9. Migraine headaches.  PAST SURGICAL HISTORY: 1. Appendectomy. 2. Excision of condyloma by Dr. Zachery Dakins in November 2001. 3. Creation of left arm fistula.  CURRENT MEDICATIONS:  1. Titralac p.r.n.  2. Darvocet p.r.n.  3. Ibuprofen 800 mg t.i.d. with meals.  4. Calcium 500 mg four times a day with meals.  5. Restoril 15 mg q.h.s.  6. Retrovir 100 mg b.i.d.  7. Kaletra q. day.  8. _______ 125 mg q. day.  9. Neurontin 100 mg b.i.d. 10. Anusol suppositories b.i.d. 11. Vitamin B-6 50 mg q. day. 12. Ketoconazole 2% cream b.i.d. 13. Nephro-Vite q. day. 14. Vancomycin 1.5 g after dialysis. 15. Tobramycin 120 mg after dialysis. 16. Tequin 400 mg after dialysis and then 200 mg once a day x  ten     days.  SOCIAL HISTORY:  He smokes one pack of cigarettes per day and denies any alcohol use.  FAMILY HISTORY:  Noncontributory.  PHYSICAL EXAMINATION:  GENERAL:  He is a well-developed, well-nourished black male in in no acute distress lying in bed.  VITAL SIGNS:  Temperature 102.7, pulse 120, blood pressure 126/70.  SKIN:  Warm and dry.  HEENT:  Extraocular movements intact. Pupils equal, round and reactive to light and accommodation.  NECK:  No bruit.  LUNGS:  Clear to auscultation bilaterally.  CARDIOVASCULAR:  Regular rate and rhythm.  ABDOMEN:  Distended but soft. He seems to be more tender laterally and in his epigastric region and much less tender centrally. His bowel sounds are present. He does not have peritoneal signs.  EXTREMITIES:  No cyanosis, clubbing, or edema. He has a thrill in his left arm fistula.  NEUROLOGICAL:  He is alert and oriented times four.  LYMPHATICS:  I could palpate no lymphadenopathy.  RECTAL:  He is heme negative.  LABORATORY AND ACCESSORY DATA:  Significant for white blood cell count of 12.7, hemoglobin 7.2, hematocrit 21.2 and platelet count 480,000. Amylase 52, lipase 15. PT 16.2, INR 1.5. Sodium 137, potassium 5.1, chloride 99, CO2 23.0, BUN 59.0, creatinine 13.7, glucose 93. Total bilirubin 1.4, SGOT 29, SGPT 19.  His urinalysis shows 6-10 white cells, 6-10 red cells.  Chest x-ray is significant for no free air and no evidence for infiltrate in his chest.  ASSESSMENT AND PLAN:  This is a 45 year old black male with human immunodeficiency virus who has had diarrhea for the last three weeks and fevers for the last several days. He does not currently have peritoneal signs and his acute abdominal series does not suggest any free air. Given his history of diarrhea, his differential certainly includes some sort of human immunodeficiency virus-related colitis.  He is being admitted to the renal service and I agree with  admitting him and starting him on a broad spectrum antibiotics and following him clinically. We will also obtain a CT of his abdomen and pelvis this evening to further evaluate his abdomen. DD:  12/31/00 TD:  12/31/00 Job: [id number]

## 2011-02-14 NOTE — Discharge Summary (Signed)
[name], [name]                    ACCOUNT NO.:  [id number]   MEDICAL RECORD NO.:  [id number]                   PATIENT TYPE:  INP   LOCATION:  5524                                 FACILITY:  [facility]   PHYSICIAN:  Maree Krabbe, M.D.             DATE OF BIRTH:  January 25, 1966   DATE OF ADMISSION:  02/17/2004  DATE OF DISCHARGE:  02/19/2004                                 DISCHARGE SUMMARY   ADMISSION DIAGNOSES:  1. Pneumonia.  2. Human immunodeficiency virus.  3. End-stage renal disease on chronic hemodialysis.   DISCHARGE DIAGNOSES:  1. Pneumonia, probable PCP.  2. Human immunodeficiency virus, noncompliant with medications.  3. Secondary hyperparathyroidism.  4. Anemia of chronic disease.  5. Elevated liver function tests with history as of __________ with liver     biopsy, June 2000.  6. Secondary hyperparathyroidism.  7. End-stage renal disease on chronic hemodialysis.   HISTORY OF PRESENT ILLNESS:  Mr. Coldwell is a 45 year old black male with  end-stage renal disease, thought to be secondary to HIV, on chronic  hemodialysis at the [facility], also history of HIV,  last seen by Dr. Orvan Falconer in the HIV clinic April 2003, presented for  outpatient hemodialysis with symptoms of 3-4 days of head congestion,  coughing up white phlegm, feeling very weak, chills, fever with temperatures  to 100.5.  Blood cultures were drawn on Feb 15, 2004, negative so far.  At  the kidney center blood cultures were repeated and he was given Vicon [place]  and a chest x-ray was ordered.  Chest x-ray showed diffuse process  consistent with atypical infection and the patient was also noted to be  hypoxic with blood gas showing a PO2 of 53.  Because of this Dr. Arlean Hopping  admitted the patient with his pneumonia to worry about PCP and his HIV and  infectious disease consult to be obtained.  The patient was hemodialysis  schedule to have a follow-up for his HIV with Dr.  Orvan Falconer.  Check CD4  counts.   HOSPITAL COURSE:  1. PNEUMONIA, PROBABLE PCP:  On his admission exam by Fransisca Kaufmann, renal     P.A., the patient was noted to have bibasilar crackles with decreased     breath sounds at the bases.  Hypoxic with arterial blood gas showing pH     of 7.6.  PCO2 was 37, PO2 was 53, 92% O2 saturation.  The patient was     seen on the day of admission by Dr. Cliffton [name] for infectious disease     consult, to help in antibiotic choices for this HIV patient with possible     PCP.  Dr. Orvan Falconer saw the patient and noted that last time seen in     office in April 2004 and the patient told him also he was not taking any     HIV medications for some time.  Chest x-ray was done by Dr. Orvan Falconer  and showed diffuse bilateral interstitial infiltrates, strongly     suggestive of PCP and the setting was hypoxia.  He was started on empiric     Septra and steroids by Dr. Orvan Falconer.  He felt better the second day.  O2     saturation was up to 100% on 2 L nasal cannula oxygen.  On May 23 he was     deemed stable for discharge home on a tapering dose of prednisone and     also to be placed on Bactrim DS two tablets p.o. daily indefinitely, as     Dr. Briant Cedar conferred with  Dr. Orvan Falconer his regimen of Bactrim and steroids.  The patient was up  ambulatory in the hall without oxygen ready for discharge home.  1. HIV:  It was noted by Dr. Orvan Falconer that the patient was not compliant     with his HIV appointments and medications.  CD4 count when last checked     in 2003 was 190.  During this hospitalization his T4 helper/CD4 count was     160.  It was noted that his HIV in ultraquant was 5/100,000.  The patient was told to make an appointment with Dr. Cliffton [name] with the  ID clinic for follow-up after discharge from the hospital to discuss further  treatment of his HIV with medications.  1. ANEMIA:  The patient was continued on the Epogen at 2000 units IV each     dialysis  for his anemic chronic disease.  Hemoglobin was 11.4, at     discharge it will be followed up at the kidney center.  2. SECONDARY HYPERPARATHYROIDISM:  The patient will be continued on     calcitriol 1 mcg IV each dialysis.  His calcium was 8.7 with an albumin     of 2.7 and a phosphorus of 5.8 at time of discharge.  He will be     continued on his calcium with the help of phosphate binders and will     follow up with biointact PTH at the kidney center.  3. END-STAGE RENAL DISEASE:  The patient was continued on dialysis and will     follow up at the [facility] with Tuesday, Thursday,     Saturday schedule.  Noted he did have potassium of 6.4 on May 22.  He was     dialyzed and potassium was down to 5.1 on May 23.  Potassium will be     followed up at the dialysis unit.  He was noted to have a lower dry     weight of 71.5 kg at discharge.   DISCHARGE MEDICATIONS:  1. Nephro-Vite one daily.  2. Septra DS two every supper indefinitely and/or per Dr. Blair Dolphin     instructions.  3. Prednisone taper 40 mg b.i.d. for four days, 40 mg daily for four days,     then 20 mg daily for four days, then stop.  4. __________ two puffs q.i.d. p.r.n.  5. Calcium carbonate 500 mg a.c.  6. Calcitriol 1 mcg IV each dialysis.  7. Epogen 2000 units IV each dialysis.   FOLLOW UP:  Follow up at [facility] Tuesday, Thursday,  Saturday schedule.  Also the patient was told to make arrangements for him  per his schedule with Dr. Cliffton [name] for follow-up at the ID clinic for  HIV follow-up.      Donald Pore, P.A.  Maree Krabbe, M.D.    DWZ/MEDQ  D:  03/11/2004  T:  03/11/2004  Job:  [id number]   cc:   [facility]   Cliffton [name], M.D.  [address] [place]  [place] [address]  Fax: [phone]

## 2011-02-14 NOTE — Op Note (Signed)
[place]. [facility]  Patient:    SANTIAGO, STENZEL                 MRN: [id number] Proc. Date: 10/16/00 Adm. Date:  [id number] Disc. Date: [id number] Attending:  Bennye Alm                           Operative Report  PREOPERATIVE DIAGNOSIS:  Poorly functioning left upper arm arteriovenous fistula.  POSTOPERATIVE DIAGNOSIS:  Poorly functioning left upper arm arteriovenous fistula.  PROCEDURE:  Ligation of branch of left upper arm AV fistula.  SURGEON:  Di Kindle. Edilia Bo, M.D.  ASSISTANT:  Areta Haber, P.A.  ANESTHESIA:  Local with sedation.  DESCRIPTION OF PROCEDURE:  The patient was taken to the operating room and sedated by anesthesia.  The left upper extremity was prepped and draped in the usual sterile fashion.  After the skin was anesthetized with 1% lidocaine, an incision was made over this competing branch, and this was dissected free and ligated with two 2-0 silk ties.  There was an excellent thrill in the fistula. Hemostasis was obtained in the wound and the wound was closed with a deep layer of 3-0 Vicryl and the skin closed with 4-0 Vicryl.  A sterile dressing was applied.  The patient tolerated the procedure well and was transferred to the recovery room in satisfactory condition.  All needle and sponge counts were correct. DD:  10/16/00 TD:  10/18/00 Job: [id number]

## 2011-02-14 NOTE — Op Note (Signed)
[place]. [facility]  Patient:    [name], [name]                 MRN: [id number] Proc. Date: 07/06/00 Adm. Date:  [id number] Attending:  Henrene Dodge CC:         Duke Salvia. Eliott Nine, M.D.   Operative Report  PREOPERATIVE DIAGNOSES: 1. Extensive perianal condyloma acuminatum. 2. Human immunodeficiency virus positive. 3. Chronic renal failure secondary to human immunodeficiency virus on    hemodialysis.  PROCEDURE:  Incision and fulguration of the extensive perianal condyloma acuminatum.  ANESTHESIA:  General.  SURGEON:  Anselm Pancoast. Zachery Dakins, M.D.  HISTORY:  [name] is a 45 year old black male victim of human immunodeficiency virus and extensive condyloma anal acuminatum.  The patient is a dialysis patient also secondary to his human immunodeficiency virus and I saw him in the office on Friday when he was referred over with severe analgesic rectal pain "hemorrhoids" but on exam he has extensive perianal condyloma acuminatum.  I talked with Dr. Orvan Falconer and also left a message with Dr. Eliott Nine, his renal doctor, who is on call this week that we would plan on excising these with general anesthesia and he would possibly need to be admitted to the hospital.  His normal dialysis day is Tuesday, Thursday, Saturday.  The patient presented and was scheduled over at [facility] instead of [facility].  Dr. Orvan Falconer had recommended that we give him Levaquin and Flagyl for a short perioperative period and we gave him the Flagyl and the Levaquin orally and then he was taken back to the operative suite.  Induction of general anesthesia and tracheal tube and then placed up in stirrups.  On exam he has got two large very areas of condyloma acuminatum one about the size of a 3 cm, and the other is about 2 cm, and the area was cauterized at its base, excised these and then a little arterial pedicle was sutured with 3-0 chromic.  Then he has  numerous little satellite areas of condyloma in the perianal area and some up in the anal canal.  These were all cauterized and then after cauterized, scraped off and the bed recauterized.  He has got mild hemorrhoids but certainly nothing that I think that needs a formal internal and external hemorrhoidectomy especially with all of this condyloma acuminatum.  We completed the surgery, reinspected the area, used an anoscope.  No evidence of any active bleeding, and then I put Xylocaine ointment on a gauze over the operative area for postoperative pain management.  Then we held then 4 x 4s in place with little stretch panties.  The patient tolerated the procedure nicely and was sent to the recovery room, extubated in stable postoperative condition.  We will make a decision in the recovery room according to pain management and if he is having any problems with bleeding whether it would be safe to let him go home or go to [facility]. I am not sure what time his dialysis is tomorrow in case he would be kept overnight here and then possibly go to dialysis in the a.m. DD:  07/06/00 TD:  07/07/00 Job: [id number]

## 2011-03-02 ENCOUNTER — Emergency Department (HOSPITAL_COMMUNITY)
Admission: EM | Admit: 2011-03-02 | Discharge: 2011-03-02 | Disposition: A | Discharge status: Home / Self Care | Payer: Medicare Other | Attending: Emergency Medicine | Admitting: Emergency Medicine

## 2011-03-02 ENCOUNTER — Emergency Department (HOSPITAL_COMMUNITY): Payer: Medicare Other

## 2011-03-02 DIAGNOSIS — W19XXXA Unspecified fall, initial encounter: Secondary | ICD-10-CM | POA: Insufficient documentation

## 2011-03-02 DIAGNOSIS — R5381 Other malaise: Secondary | ICD-10-CM | POA: Insufficient documentation

## 2011-03-02 DIAGNOSIS — R112 Nausea with vomiting, unspecified: Secondary | ICD-10-CM | POA: Insufficient documentation

## 2011-03-02 DIAGNOSIS — Z992 Dependence on renal dialysis: Secondary | ICD-10-CM | POA: Insufficient documentation

## 2011-03-02 DIAGNOSIS — Z21 Asymptomatic human immunodeficiency virus [HIV] infection status: Secondary | ICD-10-CM | POA: Insufficient documentation

## 2011-03-02 DIAGNOSIS — Z79899 Other long term (current) drug therapy: Secondary | ICD-10-CM | POA: Insufficient documentation

## 2011-03-02 DIAGNOSIS — R42 Dizziness and giddiness: Secondary | ICD-10-CM | POA: Insufficient documentation

## 2011-03-02 DIAGNOSIS — T360X5A Adverse effect of penicillins, initial encounter: Secondary | ICD-10-CM | POA: Insufficient documentation

## 2011-03-02 DIAGNOSIS — K029 Dental caries, unspecified: Secondary | ICD-10-CM | POA: Insufficient documentation

## 2011-03-02 DIAGNOSIS — R61 Generalized hyperhidrosis: Secondary | ICD-10-CM | POA: Insufficient documentation

## 2011-03-02 DIAGNOSIS — N186 End stage renal disease: Secondary | ICD-10-CM | POA: Insufficient documentation

## 2011-03-02 DIAGNOSIS — R197 Diarrhea, unspecified: Secondary | ICD-10-CM | POA: Insufficient documentation

## 2011-03-02 LAB — BASIC METABOLIC PANEL
BUN: 24 mg/dL — ABNORMAL HIGH (ref 6–23)
CO2: 19 mEq/L (ref 19–32)
Chloride: 96 mEq/L (ref 96–112)
Creatinine, Ser: 11.32 mg/dL — ABNORMAL HIGH (ref 0.4–1.5)
Glucose, Bld: 99 mg/dL (ref 70–99)
Potassium: 4.4 mEq/L (ref 3.5–5.1)

## 2011-03-02 LAB — DIFFERENTIAL
Basophils Absolute: 0.1 10*3/uL (ref 0.0–0.1)
Basophils Relative: 1 % (ref 0–1)
Eosinophils Relative: 7 % — ABNORMAL HIGH (ref 0–5)
Monocytes Absolute: 0.7 10*3/uL (ref 0.1–1.0)
Neutro Abs: 2.4 10*3/uL (ref 1.7–7.7)

## 2011-03-02 LAB — CBC
HCT: 40 % (ref 39.0–52.0)
MCH: 36.4 pg — ABNORMAL HIGH (ref 26.0–34.0)
MCV: 107.8 fL — ABNORMAL HIGH (ref 78.0–100.0)
RBC: 3.71 MIL/uL — ABNORMAL LOW (ref 4.22–5.81)
WBC: 5.7 10*3/uL (ref 4.0–10.5)

## 2011-03-31 ENCOUNTER — Other Ambulatory Visit (INDEPENDENT_AMBULATORY_CARE_PROVIDER_SITE_OTHER): Payer: Medicare Other

## 2011-03-31 DIAGNOSIS — B20 Human immunodeficiency virus [HIV] disease: Secondary | ICD-10-CM

## 2011-03-31 DIAGNOSIS — Z79899 Other long term (current) drug therapy: Secondary | ICD-10-CM

## 2011-03-31 DIAGNOSIS — Z113 Encounter for screening for infections with a predominantly sexual mode of transmission: Secondary | ICD-10-CM

## 2011-04-01 ENCOUNTER — Telehealth: Payer: Self-pay | Admitting: *Deleted

## 2011-04-01 LAB — CBC WITH DIFFERENTIAL/PLATELET
Basophils Absolute: 0 10*3/uL (ref 0.0–0.1)
Eosinophils Absolute: 0.3 10*3/uL (ref 0.0–0.7)
Eosinophils Relative: 5 % (ref 0–5)
Lymphocytes Relative: 47 % — ABNORMAL HIGH (ref 12–46)
MCV: 105.4 fL — ABNORMAL HIGH (ref 78.0–100.0)
Neutrophils Relative %: 38 % — ABNORMAL LOW (ref 43–77)
Platelets: 120 10*3/uL — ABNORMAL LOW (ref 150–400)
RBC: 3.52 MIL/uL — ABNORMAL LOW (ref 4.22–5.81)
RDW: 13.5 % (ref 11.5–15.5)
WBC: 5.3 10*3/uL (ref 4.0–10.5)

## 2011-04-01 LAB — T-HELPER CELL (CD4) - (RCID CLINIC ONLY): CD4 T Cell Abs: 570 uL (ref 400–2700)

## 2011-04-01 LAB — COMPLETE METABOLIC PANEL WITH GFR
AST: 23 U/L (ref 0–37)
Albumin: 4 g/dL (ref 3.5–5.2)
BUN: 30 mg/dL — ABNORMAL HIGH (ref 6–23)
CO2: 29 mEq/L (ref 19–32)
Calcium: 8.4 mg/dL (ref 8.4–10.5)
Chloride: 97 mEq/L (ref 96–112)
Creat: 12.21 mg/dL — ABNORMAL HIGH (ref 0.50–1.35)
GFR, Est African American: 5 mL/min — ABNORMAL LOW (ref >60)
Glucose, Bld: 86 mg/dL (ref 70–99)
Potassium: 5.3 mEq/L (ref 3.5–5.3)

## 2011-04-01 LAB — LIPID PANEL
LDL Cholesterol: 113 mg/dL — ABNORMAL HIGH (ref 0–99)
Triglycerides: 199 mg/dL — ABNORMAL HIGH (ref <150)

## 2011-04-01 LAB — RPR

## 2011-04-01 LAB — HIV-1 RNA QUANT-NO REFLEX-BLD: HIV 1 RNA Quant: 96 copies/mL — ABNORMAL HIGH (ref <20)

## 2011-04-01 NOTE — Telephone Encounter (Signed)
Gave message to md

## 2011-04-15 ENCOUNTER — Ambulatory Visit: Payer: Medicare Other | Admitting: Internal Medicine

## 2011-04-29 ENCOUNTER — Ambulatory Visit (INDEPENDENT_AMBULATORY_CARE_PROVIDER_SITE_OTHER): Payer: Medicare Other | Admitting: Internal Medicine

## 2011-04-29 ENCOUNTER — Encounter: Payer: Self-pay | Admitting: Internal Medicine

## 2011-04-29 VITALS — BP 185/86 | HR 68 | Temp 97.5°F | Ht 71.0 in | Wt 161.5 lb

## 2011-04-29 DIAGNOSIS — Z13220 Encounter for screening for lipoid disorders: Secondary | ICD-10-CM

## 2011-04-29 DIAGNOSIS — B20 Human immunodeficiency virus [HIV] disease: Secondary | ICD-10-CM

## 2011-04-29 DIAGNOSIS — Z119 Encounter for screening for infectious and parasitic diseases, unspecified: Secondary | ICD-10-CM

## 2011-04-29 DIAGNOSIS — Z79899 Other long term (current) drug therapy: Secondary | ICD-10-CM

## 2011-04-29 NOTE — Progress Notes (Signed)
  Subjective:    Patient ID: [name], male    DOB: 1966/02/08, 45 y.o.   MRN: [id number]  HPI [name] is in for his routine visit. He states that he has not had any problems obtaining or tolerating his HIV medications and does not miss a single dose. He continues to use his pillbox. He states that he is feeling much better over the past year.    Review of Systems     Objective:   Physical Exam  Constitutional: He appears well-nourished. No distress.  HENT:  Mouth/Throat: Oropharynx is clear and moist. No oropharyngeal exudate.  Cardiovascular: Normal heart sounds.   No murmur heard. Pulmonary/Chest: Breath sounds normal. He has no wheezes. He has no rales.  Psychiatric: He has a normal mood and affect.          Assessment & Plan:   No problem-specific assessment & plan notes found for this encounter.

## 2011-04-29 NOTE — Assessment & Plan Note (Signed)
[name] he has made a remarkable turnaround in the past year. His adherence has been excellent and as a result of that his CD4 count has risen from 80 to 570 and his viral load has come down from 650,000 to 94. I have encouraged him to continue to use his pillbox and try not to miss a single dose of his antiretroviral meds.

## 2011-05-15 ENCOUNTER — Other Ambulatory Visit (HOSPITAL_COMMUNITY): Payer: Self-pay | Admitting: Nephrology

## 2011-05-15 DIAGNOSIS — N186 End stage renal disease: Secondary | ICD-10-CM

## 2011-05-16 ENCOUNTER — Other Ambulatory Visit (HOSPITAL_COMMUNITY): Payer: Self-pay | Admitting: Nephrology

## 2011-05-16 ENCOUNTER — Ambulatory Visit (HOSPITAL_COMMUNITY)
Admission: RE | Admit: 2011-05-16 | Discharge: 2011-05-16 | Disposition: A | Discharge status: Home / Self Care | Payer: Medicare Other | Source: Ambulatory Visit | Attending: Nephrology | Admitting: Nephrology

## 2011-05-16 DIAGNOSIS — Y849 Medical procedure, unspecified as the cause of abnormal reaction of the patient, or of later complication, without mention of misadventure at the time of the procedure: Secondary | ICD-10-CM | POA: Insufficient documentation

## 2011-05-16 DIAGNOSIS — T82898A Other specified complication of vascular prosthetic devices, implants and grafts, initial encounter: Secondary | ICD-10-CM | POA: Insufficient documentation

## 2011-05-16 DIAGNOSIS — N186 End stage renal disease: Secondary | ICD-10-CM

## 2011-05-16 DIAGNOSIS — I120 Hypertensive chronic kidney disease with stage 5 chronic kidney disease or end stage renal disease: Secondary | ICD-10-CM | POA: Insufficient documentation

## 2011-05-16 LAB — POCT I-STAT 4, (NA,K, GLUC, HGB,HCT)
Potassium: 6 mEq/L — ABNORMAL HIGH (ref 3.5–5.1)
Sodium: 138 mEq/L (ref 135–145)

## 2011-05-16 LAB — POTASSIUM: Potassium: 7.1 mEq/L (ref 3.5–5.1)

## 2011-06-09 ENCOUNTER — Encounter: Payer: Self-pay | Admitting: Vascular Surgery

## 2011-06-10 ENCOUNTER — Ambulatory Visit (INDEPENDENT_AMBULATORY_CARE_PROVIDER_SITE_OTHER): Payer: Medicare Other | Admitting: Vascular Surgery

## 2011-06-10 ENCOUNTER — Encounter: Payer: Self-pay | Admitting: Vascular Surgery

## 2011-06-10 VITALS — BP 140/73 | HR 76 | Resp 16

## 2011-06-10 DIAGNOSIS — N186 End stage renal disease: Secondary | ICD-10-CM

## 2011-06-10 NOTE — Progress Notes (Signed)
The patient presents today for discussion of AV access. He had a right forearm fistula initially placed in January of 2010. He had poor flow in this recently with a shuntogram showed no correctable issues. He subsequently occluded this has had a hemodialysis catheter placed in his right internal jugular. Sitting in today for further discussion. He reports that he is having adequate dialysis via the catheter. He has not had an upper arm fistula on the right he did have an old upper arm fistula on the left he reports last for 10 years.  Past Medical History  Diagnosis Date  . HIV infection   . Chronic kidney disease   . DJD (degenerative joint disease)     right knee  . Tertiary syphilis   . Genital herpes   . Anal condyloma   . Thyroid disease   . Hypertension   . Family history of disseminated HSV infection     History  Substance Use Topics  . Smoking status: Current Everyday Smoker -- 0.5 packs/day    Types: Cigarettes  . Smokeless tobacco: Never Used  . Alcohol Use: No    No family history on file.  Allergies  Allergen Reactions  . Oxycodone-Acetaminophen   . Penicillins   . Tylenol-Codeine     Current outpatient prescriptions:atazanavir (REYATAZ) 300 MG capsule, Take 1 capsule (300 mg total) by mouth daily., Disp: 30 capsule, Rfl: 3;  lamiVUDine (EPIVIR) 10 MG/ML solution, Take 5 mLs (50 mg total) by mouth at bedtime., Disp: 240 mL, Rfl: 2;  ritonavir (NORVIR) 100 MG TABS, Take 1 tablet (100 mg total) by mouth daily., Disp: 30 tablet, Rfl: 2 ritonavir-lopinavir (KALETRA) 133.3-33.3 MG per capsule, Take 3 capsules by mouth 2 (two) times daily.  , Disp: , Rfl: ;  SULFAMETHOXAZOLE-TRIMETHOPRIM PO, Take by mouth every Monday, Wednesday, and Friday.  , Disp: , Rfl: ;  tenofovir (VIREAD) 300 MG tablet, Take 1 tablet (300 mg total) by mouth every 7 (seven) days., Disp: 4 tablet, Rfl: 2;  valACYclovir (VALTREX) 500 MG tablet, Take 1 tablet (500 mg total) by mouth daily., Disp: 30 tablet,  Rfl: 2 ALBUTEROL IN, Inhale 2 puffs into the lungs 4 (four) times daily as needed.  , Disp: , Rfl: ;  allopurinol (ZYLOPRIM) 100 MG tablet, Take 100 mg by mouth daily.  , Disp: , Rfl: ;  B Complex-C-Folic Acid (NEPHRO-VITE PO), Take by mouth.  , Disp: , Rfl: ;  Chlorphen-Pseudoephed-APAP (CORICIDIN D PO), Take by mouth as needed.  , Disp: , Rfl: ;  loratadine (CLARITIN) 10 MG tablet, Take 10 mg by mouth daily.  , Disp: , Rfl:  methocarbamol (ROBAXIN) 500 MG tablet, Take 500 mg by mouth daily.  , Disp: , Rfl: ;  sildenafil (VIAGRA) 100 MG tablet, Take 100 mg by mouth daily as needed.  , Disp: , Rfl: ;  temazepam (RESTORIL) 7.5 MG capsule, Take 7.5 mg by mouth at bedtime as needed.  , Disp: , Rfl:   BP 140/73  Pulse 76  Resp 16  There is no height or weight on file to calculate BMI.        Review of systems: Negative except for history of present illness  Physical exam: Well-developed well-nourished black male appearing stated age in no acute distress. 2+ right radial and right brachial pulse. Occluded right forearm and left upper arm AV fistulas.  I imaged the right upper arm with the SonoSite ultrasound. This showed 8 and right upper arm cephalic vein of moderate size.  Assessment and plan: I discussed options with the patient. I have recommended right upper arm AV fistula creation. We've scheduled this for his nondialysis day of September 17 with Dr. Darrick Penna

## 2011-06-12 ENCOUNTER — Ambulatory Visit: Payer: Medicare Other

## 2011-06-16 ENCOUNTER — Ambulatory Visit (HOSPITAL_COMMUNITY)
Admission: RE | Admit: 2011-06-16 | Discharge: 2011-06-16 | Disposition: A | Discharge status: Home / Self Care | Payer: Medicare Other | Source: Ambulatory Visit | Attending: Vascular Surgery | Admitting: Vascular Surgery

## 2011-06-16 DIAGNOSIS — I120 Hypertensive chronic kidney disease with stage 5 chronic kidney disease or end stage renal disease: Secondary | ICD-10-CM | POA: Insufficient documentation

## 2011-06-16 DIAGNOSIS — N186 End stage renal disease: Secondary | ICD-10-CM

## 2011-06-16 DIAGNOSIS — Z21 Asymptomatic human immunodeficiency virus [HIV] infection status: Secondary | ICD-10-CM | POA: Insufficient documentation

## 2011-06-16 HISTORY — PX: AV FISTULA PLACEMENT: SHX1204

## 2011-06-16 HISTORY — PX: AV FISTULA PLACEMENT, BRACHIOCEPHALIC: SHX1207

## 2011-06-16 LAB — POCT I-STAT 4, (NA,K, GLUC, HGB,HCT): Hemoglobin: 12.9 g/dL — ABNORMAL LOW (ref 13.0–17.0)

## 2011-06-16 LAB — SURGICAL PCR SCREEN: MRSA, PCR: NEGATIVE

## 2011-06-19 ENCOUNTER — Other Ambulatory Visit: Payer: Self-pay | Admitting: *Deleted

## 2011-06-19 DIAGNOSIS — B20 Human immunodeficiency virus [HIV] disease: Secondary | ICD-10-CM

## 2011-06-19 DIAGNOSIS — B009 Herpesviral infection, unspecified: Secondary | ICD-10-CM

## 2011-06-19 MED ORDER — ATAZANAVIR SULFATE 300 MG PO CAPS: 300.0000 mg | ORAL_CAPSULE | Freq: Every day | ORAL | Status: DC

## 2011-06-19 MED ORDER — RITONAVIR 100 MG PO TABS: 100.0000 mg | ORAL_TABLET | Freq: Every day | ORAL | Status: DC

## 2011-06-19 MED ORDER — LAMIVUDINE 10 MG/ML PO SOLN: 50.0000 mg | Freq: Every day | ORAL | Status: DC

## 2011-06-19 MED ORDER — SULFAMETHOXAZOLE-TRIMETHOPRIM 800-160 MG PO TABS: 1.0000 | ORAL_TABLET | ORAL | Status: DC

## 2011-06-19 MED ORDER — VALACYCLOVIR HCL 500 MG PO TABS: 500.0000 mg | ORAL_TABLET | Freq: Every day | ORAL | Status: DC

## 2011-06-19 MED ORDER — TENOFOVIR DISOPROXIL FUMARATE 300 MG PO TABS: 300.0000 mg | ORAL_TABLET | ORAL | Status: DC

## 2011-06-23 ENCOUNTER — Emergency Department (HOSPITAL_COMMUNITY)
Admission: EM | Admit: 2011-06-23 | Discharge: 2011-06-23 | Disposition: A | Discharge status: Home / Self Care | Payer: Medicare Other | Attending: Emergency Medicine | Admitting: Emergency Medicine

## 2011-06-23 ENCOUNTER — Emergency Department (HOSPITAL_COMMUNITY): Payer: Medicare Other

## 2011-06-23 ENCOUNTER — Other Ambulatory Visit: Payer: Self-pay | Admitting: Internal Medicine

## 2011-06-23 DIAGNOSIS — R062 Wheezing: Secondary | ICD-10-CM | POA: Insufficient documentation

## 2011-06-23 DIAGNOSIS — R05 Cough: Secondary | ICD-10-CM | POA: Insufficient documentation

## 2011-06-23 DIAGNOSIS — J3489 Other specified disorders of nose and nasal sinuses: Secondary | ICD-10-CM | POA: Insufficient documentation

## 2011-06-23 DIAGNOSIS — Z21 Asymptomatic human immunodeficiency virus [HIV] infection status: Secondary | ICD-10-CM | POA: Insufficient documentation

## 2011-06-23 DIAGNOSIS — F17200 Nicotine dependence, unspecified, uncomplicated: Secondary | ICD-10-CM | POA: Insufficient documentation

## 2011-06-23 DIAGNOSIS — R0602 Shortness of breath: Secondary | ICD-10-CM | POA: Insufficient documentation

## 2011-06-23 DIAGNOSIS — R059 Cough, unspecified: Secondary | ICD-10-CM | POA: Insufficient documentation

## 2011-06-23 DIAGNOSIS — H5789 Other specified disorders of eye and adnexa: Secondary | ICD-10-CM | POA: Insufficient documentation

## 2011-06-23 DIAGNOSIS — Z992 Dependence on renal dialysis: Secondary | ICD-10-CM | POA: Insufficient documentation

## 2011-06-23 DIAGNOSIS — N189 Chronic kidney disease, unspecified: Secondary | ICD-10-CM | POA: Insufficient documentation

## 2011-06-23 LAB — T-HELPER CELL (CD4) - (RCID CLINIC ONLY)
CD4 % Helper T Cell: 12 — ABNORMAL LOW
CD4 T Cell Abs: 200 — ABNORMAL LOW

## 2011-06-24 ENCOUNTER — Other Ambulatory Visit: Payer: Self-pay | Admitting: *Deleted

## 2011-06-24 DIAGNOSIS — B20 Human immunodeficiency virus [HIV] disease: Secondary | ICD-10-CM

## 2011-06-24 MED ORDER — SULFAMETHOXAZOLE-TRIMETHOPRIM 800-160 MG PO TABS: 1.0000 | ORAL_TABLET | ORAL | Status: DC

## 2011-06-24 MED ORDER — AZITHROMYCIN 600 MG PO TABS: 1200.0000 mg | ORAL_TABLET | ORAL | Status: DC

## 2011-07-01 LAB — T-HELPER CELLS (CD4) COUNT (NOT AT ARMC): CD4 T Cell Abs: 110 uL — ABNORMAL LOW (ref 400–2700)

## 2011-07-01 LAB — GC/CHLAMYDIA PROBE AMP, GENITAL: GC Probe Amp, Genital: NEGATIVE

## 2011-07-01 LAB — COMPREHENSIVE METABOLIC PANEL
ALT: 38 U/L (ref 0–53)
ALT: 63 U/L — ABNORMAL HIGH (ref 0–53)
AST: 44 U/L — ABNORMAL HIGH (ref 0–37)
AST: 56 U/L — ABNORMAL HIGH (ref 0–37)
Albumin: 2.2 g/dL — ABNORMAL LOW (ref 3.5–5.2)
Albumin: 2.2 g/dL — ABNORMAL LOW (ref 3.5–5.2)
Alkaline Phosphatase: 620 U/L — ABNORMAL HIGH (ref 39–117)
Alkaline Phosphatase: 746 U/L — ABNORMAL HIGH (ref 39–117)
CO2: 22 mEq/L (ref 19–32)
Calcium: 8.6 mg/dL (ref 8.4–10.5)
Chloride: 107 mEq/L (ref 96–112)
GFR calc Af Amer: 8 mL/min — ABNORMAL LOW (ref >60)
GFR calc Af Amer: 8 mL/min — ABNORMAL LOW (ref >60)
Glucose, Bld: 86 mg/dL (ref 70–99)
Potassium: 4 mEq/L (ref 3.5–5.1)
Potassium: 4.8 mEq/L (ref 3.5–5.1)
Sodium: 134 mEq/L — ABNORMAL LOW (ref 135–145)
Total Bilirubin: 1.3 mg/dL — ABNORMAL HIGH (ref 0.3–1.2)
Total Protein: 6.4 g/dL (ref 6.0–8.3)

## 2011-07-01 LAB — CULTURE, BLOOD (ROUTINE X 2): Culture: NO GROWTH

## 2011-07-01 LAB — RENAL FUNCTION PANEL
CO2: 20 mEq/L (ref 19–32)
Calcium: 8.4 mg/dL (ref 8.4–10.5)
Chloride: 100 mEq/L (ref 96–112)
Creatinine, Ser: 9.38 mg/dL — ABNORMAL HIGH (ref 0.4–1.5)
GFR calc Af Amer: 8 mL/min — ABNORMAL LOW (ref >60)
GFR calc non Af Amer: 6 mL/min — ABNORMAL LOW (ref >60)
Glucose, Bld: 84 mg/dL (ref 70–99)

## 2011-07-01 LAB — CBC
HCT: 28 % — ABNORMAL LOW (ref 39.0–52.0)
HCT: 36.2 % — ABNORMAL LOW (ref 39.0–52.0)
HCT: 39.6 % (ref 39.0–52.0)
Hemoglobin: 11.5 g/dL — ABNORMAL LOW (ref 13.0–17.0)
Hemoglobin: 12.5 g/dL — ABNORMAL LOW (ref 13.0–17.0)
MCHC: 31.9 g/dL (ref 30.0–36.0)
MCHC: 32.3 g/dL (ref 30.0–36.0)
MCV: 94.6 fL (ref 78.0–100.0)
MCV: 95.3 fL (ref 78.0–100.0)
MCV: 95.5 fL (ref 78.0–100.0)
Platelets: 115 10*3/uL — ABNORMAL LOW (ref 150–400)
Platelets: 115 10*3/uL — ABNORMAL LOW (ref 150–400)
Platelets: 120 10*3/uL — ABNORMAL LOW (ref 150–400)
Platelets: 123 10*3/uL — ABNORMAL LOW (ref 150–400)
RBC: 3.54 MIL/uL — ABNORMAL LOW (ref 4.22–5.81)
RBC: 3.79 MIL/uL — ABNORMAL LOW (ref 4.22–5.81)
RBC: 4.11 MIL/uL — ABNORMAL LOW (ref 4.22–5.81)
RDW: 21.5 % — ABNORMAL HIGH (ref 11.5–15.5)
RDW: 21.6 % — ABNORMAL HIGH (ref 11.5–15.5)
WBC: 12.9 10*3/uL — ABNORMAL HIGH (ref 4.0–10.5)
WBC: 3 10*3/uL — ABNORMAL LOW (ref 4.0–10.5)
WBC: 3.6 10*3/uL — ABNORMAL LOW (ref 4.0–10.5)

## 2011-07-01 LAB — HIV 1/2 CONFIRMATION: HIV-1 antibody: POSITIVE

## 2011-07-01 LAB — BASIC METABOLIC PANEL
BUN: 24 mg/dL — ABNORMAL HIGH (ref 6–23)
CO2: 27 mEq/L (ref 19–32)
Calcium: 8.3 mg/dL — ABNORMAL LOW (ref 8.4–10.5)
Chloride: 105 mEq/L (ref 96–112)
Creatinine, Ser: 6.8 mg/dL — ABNORMAL HIGH (ref 0.4–1.5)
GFR calc Af Amer: 11 mL/min — ABNORMAL LOW (ref >60)

## 2011-07-01 LAB — DIFFERENTIAL
Basophils Relative: 0 % (ref 0–1)
Eosinophils Absolute: 0 10*3/uL (ref 0.0–0.7)
Eosinophils Relative: 0 % (ref 0–5)
Lymphocytes Relative: 31 % (ref 12–46)
Lymphs Abs: 1.8 10*3/uL (ref 0.7–4.0)
Lymphs Abs: 4 10*3/uL (ref 0.7–4.0)
Monocytes Absolute: 0.6 10*3/uL (ref 0.1–1.0)
Monocytes Relative: 14 % — ABNORMAL HIGH (ref 3–12)
Neutro Abs: 0.6 10*3/uL — ABNORMAL LOW (ref 1.7–7.7)

## 2011-07-01 LAB — POCT I-STAT, CHEM 8
Calcium, Ion: 1.24 mmol/L (ref 1.12–1.32)
Chloride: 99 mEq/L (ref 96–112)
HCT: 44 % (ref 39.0–52.0)
Potassium: 4 mEq/L (ref 3.5–5.1)
Sodium: 137 mEq/L (ref 135–145)

## 2011-07-01 LAB — HIV-1 GENOTYPR PLUS: Resistance Assoc RT Mutations: NOT DETECTED

## 2011-07-01 LAB — VANCOMYCIN, TROUGH: Vancomycin Tr: 22.4 ug/mL — ABNORMAL HIGH (ref 10.0–20.0)

## 2011-07-01 LAB — WOUND CULTURE: Gram Stain: NONE SEEN

## 2011-07-01 LAB — CK TOTAL AND CKMB (NOT AT ARMC)
Relative Index: INVALID (ref 0.0–2.5)
Total CK: 25 U/L (ref 7–232)

## 2011-07-01 LAB — HIV-1 RNA ULTRAQUANT REFLEX TO GENTYP+: HIV 1 RNA Quant: 1200000 copies/mL — ABNORMAL HIGH (ref <48)

## 2011-07-01 LAB — HEPATIC FUNCTION PANEL
ALT: 38 U/L (ref 0–53)
AST: 44 U/L — ABNORMAL HIGH (ref 0–37)
Alkaline Phosphatase: 746 U/L — ABNORMAL HIGH (ref 39–117)
Bilirubin, Direct: 0.3 mg/dL (ref 0.0–0.3)
Total Bilirubin: 1.3 mg/dL — ABNORMAL HIGH (ref 0.3–1.2)

## 2011-07-01 LAB — PATHOLOGIST SMEAR REVIEW

## 2011-07-01 LAB — HIV ANTIBODY (ROUTINE TESTING W REFLEX): HIV: REACTIVE — AB

## 2011-07-01 LAB — TROPONIN I
Troponin I: 0.02 ng/mL (ref 0.00–0.06)
Troponin I: 0.03 ng/mL (ref 0.00–0.06)

## 2011-07-02 NOTE — Op Note (Signed)
  NAMEWILIAM, CAUTHORN NO.:  [id number]  MEDICAL RECORD NO.:  [id number]  LOCATION:  [facility]                         FACILITY:  [facility]  PHYSICIAN:  Janetta Hora. Tiffney Haughton, MD  DATE OF BIRTH:  04/12/1966  DATE OF PROCEDURE:  06/16/2011 DATE OF DISCHARGE:  06/16/2011                              OPERATIVE REPORT   PROCEDURE:  Right brachiocephalic arteriovenous fistula.  PREOPERATIVE DIAGNOSIS:  End-stage renal disease.  POSTOPERATIVE DIAGNOSIS:  End-stage renal disease.  ANESTHESIA:  General.  ASSISTANT:  Della Goo, PA-C  OPERATIVE FINDINGS:  A 3-mm cephalic vein right arm.  OPERATIVE DETAILS:  After obtaining informed consent, the patient was taken to the operating room.  The patient was placed in supine position on the operating table.  After induction of general anesthesia and placement of laryngeal mask, the patient's entire right upper extremity was prepped and draped in usual sterile fashion.  Next, a transverse incision was made on the right arm near the antecubital crease. Incision was carried down through the subcutaneous tissues down the level of the cephalic vein.  Cephalic vein was dissected free circumferentially.  Small side branches were ligated and divided between silk ties or clips.  Next, brachial artery was dissected free in the medial portion of the incision.  The patient was given 5000 units of heparin.  The brachial artery was controlled proximally and distally with vessel loops.  The distal cephalic vein was ligated with 2-0 silk tie and the vein swung over the level of the artery.  It was gently distended with heparinized saline and flushed thoroughly.  It was marked for orientation.  It was occluded proximally with a fine bulldog clamp. A longitudinal opening was made in the brachial artery and the vein was sewn end-of-vein to side -of-artery using a running 7-0 Prolene suture.  It should be noted that basilic vein was posterior to  this fistula.  Just prior to completion of anastomosis, it was fore bled, back bled, and thoroughly flushed.  Anastomosis was secured, vessel loops released, there was a palpable thrill in the fistula immediately.  Hemostasis was obtained. Subcutaneous tissues were reapproximated with a running 3-0 Vicryl suture.  The skin was closed with a 4-0 Vicryl subcuticular stitch.  The patient tolerated the procedure well and there were no complications. Instrument, sponge, and needle counts were correct at the end of the case.  The patient taken to the recovery room in stable condition.     Janetta Hora. Achol Azpeitia, MD     CEF/MEDQ  D:  06/16/2011  T:  06/16/2011  Job:  [id number]  Electronically Signed by Fabienne Bruns MD on 07/02/2011 11:40:10 AM

## 2011-07-04 LAB — POCT I-STAT, CHEM 8
BUN: 30 mg/dL — ABNORMAL HIGH (ref 6–23)
Hemoglobin: 14.3 g/dL (ref 13.0–17.0)
Potassium: 4.6 mEq/L (ref 3.5–5.1)
Sodium: 141 mEq/L (ref 135–145)
TCO2: 27 mmol/L (ref 0–100)

## 2011-07-04 LAB — POCT I-STAT 4, (NA,K, GLUC, HGB,HCT)
Hemoglobin: 15.3 g/dL (ref 13.0–17.0)
Potassium: 4.6 mEq/L (ref 3.5–5.1)

## 2011-07-09 ENCOUNTER — Emergency Department (HOSPITAL_COMMUNITY): Payer: Medicare Other

## 2011-07-09 ENCOUNTER — Emergency Department (HOSPITAL_COMMUNITY)
Admission: EM | Admit: 2011-07-09 | Discharge: 2011-07-09 | Disposition: A | Discharge status: Home / Self Care | Payer: Medicare Other | Attending: Emergency Medicine | Admitting: Emergency Medicine

## 2011-07-09 DIAGNOSIS — Z79899 Other long term (current) drug therapy: Secondary | ICD-10-CM | POA: Insufficient documentation

## 2011-07-09 DIAGNOSIS — M719 Bursopathy, unspecified: Secondary | ICD-10-CM | POA: Insufficient documentation

## 2011-07-09 DIAGNOSIS — Z21 Asymptomatic human immunodeficiency virus [HIV] infection status: Secondary | ICD-10-CM | POA: Insufficient documentation

## 2011-07-09 DIAGNOSIS — Z992 Dependence on renal dialysis: Secondary | ICD-10-CM | POA: Insufficient documentation

## 2011-07-09 DIAGNOSIS — M67919 Unspecified disorder of synovium and tendon, unspecified shoulder: Secondary | ICD-10-CM | POA: Insufficient documentation

## 2011-07-09 DIAGNOSIS — M25529 Pain in unspecified elbow: Secondary | ICD-10-CM | POA: Insufficient documentation

## 2011-07-09 DIAGNOSIS — M25519 Pain in unspecified shoulder: Secondary | ICD-10-CM | POA: Insufficient documentation

## 2011-07-09 DIAGNOSIS — N189 Chronic kidney disease, unspecified: Secondary | ICD-10-CM | POA: Insufficient documentation

## 2011-07-09 LAB — T-HELPER CELL (CD4) - (RCID CLINIC ONLY)
CD4 % Helper T Cell: 11 — ABNORMAL LOW
CD4 T Cell Abs: 180 — ABNORMAL LOW

## 2011-07-24 ENCOUNTER — Encounter: Payer: Self-pay | Admitting: Thoracic Diseases

## 2011-07-24 ENCOUNTER — Ambulatory Visit (INDEPENDENT_AMBULATORY_CARE_PROVIDER_SITE_OTHER): Payer: Medicare Other | Admitting: Thoracic Diseases

## 2011-07-24 VITALS — BP 144/68 | HR 81 | Resp 24 | Ht 71.0 in | Wt 160.4 lb

## 2011-07-24 DIAGNOSIS — N186 End stage renal disease: Secondary | ICD-10-CM

## 2011-07-24 NOTE — Progress Notes (Signed)
[facility] [place]  Postoperative Visit hemodialysis access   Date of Surgery: 06/16/11 right B-C AVF Surgeon: CEF Nephrologist: Dr. Arrie Aran  HPI: [name] is a 45 y.o. male who is 6 weeks S/P creation/revision of right upper extremity Hemodialysis access. The patient denies symptoms of numbness, tingling, weakness and denies pain in the operative limb. Patient is here for post -op evaluation to assess healing and maturation of right Brachio-cephalic AVF .  Pt is on hemodialysis through  right IJ cath.  Physical Examination  Filed Vitals:   07/24/11 1500  BP: 144/68  Pulse: 81  Resp: 24    WDWN male in NAD.  left upper extremity Incision is clean, dry, intact Skin color is normal   Hand grip is 5/5 and sensation in digits is intact; There is a good thrill and good bruit in the AVF There is a good sized medial branch which does not have a thrill in it and does not seem to effect the flow of the fistula. The graft/fistula is easily palpable and of adequate size  Assessment/Plan [name] is a 45 y.o. year old who is s/p creation/revision of right upper arm Brachio-cephalic Hemodialysis access. Follow-up in 6 weeks  The patient's access will be ready for use in 6 weeks.  Clinic MD: CE Darrick Penna, MD

## 2011-09-03 ENCOUNTER — Encounter: Payer: Self-pay | Admitting: Vascular Surgery

## 2011-09-04 ENCOUNTER — Encounter: Payer: Self-pay | Admitting: Vascular Surgery

## 2011-09-04 ENCOUNTER — Ambulatory Visit (INDEPENDENT_AMBULATORY_CARE_PROVIDER_SITE_OTHER): Payer: Medicare Other | Admitting: Vascular Surgery

## 2011-09-04 VITALS — BP 200/105 | HR 78 | Temp 98.2°F | Ht 71.0 in | Wt 160.3 lb

## 2011-09-04 DIAGNOSIS — N186 End stage renal disease: Secondary | ICD-10-CM

## 2011-09-04 NOTE — Progress Notes (Signed)
[facility] [place] HISTORY AND PHYSICAL    History of Present Illness:  Patient is a 45 y.o. year old male who presents for post-operative follow-up after placement of right brachial cephalic AVF 06/16/11. He has no symptoms of steal. He is currently dialyzing via a right-sided catheter.   Physical Examination  Filed Vitals:   09/04/11 1534  BP: 200/105  Pulse: 78  Temp: 98.2 F (36.8 C)    Body mass index is 22.35 kg/(m^2).  General:  Alert and oriented, no acute distress Neck: No bruit or JVD Skin: No rash Extremities:  Right upper extremity fistulas quite prominent and easily palpable with the shoulder. There is an easily palpable thrill within this. Neurologic: Upper and lower extremity motor 5/5 and symmetric    ASSESSMENT: Mature right brachiocephalic AV fistula   PLAN: He will followup with [place] on as-needed basis if he has any further access issues.   Fabienne Bruns, MD [facility] [place] Office: [phone] Pager: [phone]

## 2011-09-22 ENCOUNTER — Other Ambulatory Visit: Payer: Self-pay | Admitting: Internal Medicine

## 2011-09-22 DIAGNOSIS — B20 Human immunodeficiency virus [HIV] disease: Secondary | ICD-10-CM

## 2011-11-03 ENCOUNTER — Other Ambulatory Visit (HOSPITAL_COMMUNITY): Payer: Self-pay | Admitting: Nephrology

## 2011-11-03 DIAGNOSIS — N186 End stage renal disease: Secondary | ICD-10-CM

## 2011-11-05 ENCOUNTER — Ambulatory Visit (HOSPITAL_COMMUNITY)
Admission: RE | Admit: 2011-11-05 | Discharge: 2011-11-05 | Disposition: A | Discharge status: Home / Self Care | Payer: Medicare Other | Source: Ambulatory Visit | Attending: Nephrology | Admitting: Nephrology

## 2011-11-05 DIAGNOSIS — N186 End stage renal disease: Secondary | ICD-10-CM | POA: Insufficient documentation

## 2011-11-05 DIAGNOSIS — Z4901 Encounter for fitting and adjustment of extracorporeal dialysis catheter: Secondary | ICD-10-CM | POA: Insufficient documentation

## 2011-11-05 MED ORDER — CHLORHEXIDINE GLUCONATE 4 % EX LIQD
CUTANEOUS | Status: AC
  Filled 2011-11-05: qty 30

## 2011-11-05 NOTE — Procedures (Signed)
Successful removal of right jugular approach dialysis catheter.  No immediate complications.

## 2011-11-06 ENCOUNTER — Telehealth (HOSPITAL_COMMUNITY): Payer: Self-pay

## 2011-12-03 ENCOUNTER — Ambulatory Visit: Payer: Medicare Other

## 2012-01-01 ENCOUNTER — Encounter: Payer: Self-pay | Admitting: Vascular Surgery

## 2012-01-02 ENCOUNTER — Ambulatory Visit (INDEPENDENT_AMBULATORY_CARE_PROVIDER_SITE_OTHER): Payer: Medicare Other | Admitting: Vascular Surgery

## 2012-01-02 ENCOUNTER — Encounter: Payer: Self-pay | Admitting: Vascular Surgery

## 2012-01-02 ENCOUNTER — Ambulatory Visit (INDEPENDENT_AMBULATORY_CARE_PROVIDER_SITE_OTHER): Payer: Medicare Other | Admitting: *Deleted

## 2012-01-02 VITALS — BP 165/86 | HR 75 | Temp 98.2°F | Ht 71.0 in | Wt 158.0 lb

## 2012-01-02 DIAGNOSIS — T82598A Other mechanical complication of other cardiac and vascular devices and implants, initial encounter: Secondary | ICD-10-CM

## 2012-01-02 DIAGNOSIS — T82868A Thrombosis of vascular prosthetic devices, implants and grafts, initial encounter: Secondary | ICD-10-CM

## 2012-01-02 DIAGNOSIS — N186 End stage renal disease: Secondary | ICD-10-CM | POA: Insufficient documentation

## 2012-01-02 DIAGNOSIS — Z992 Dependence on renal dialysis: Secondary | ICD-10-CM

## 2012-01-02 DIAGNOSIS — T82898A Other specified complication of vascular prosthetic devices, implants and grafts, initial encounter: Secondary | ICD-10-CM

## 2012-01-02 HISTORY — DX: Thrombosis due to vascular prosthetic devices, implants and grafts, initial encounter: T82.868A

## 2012-01-02 HISTORY — DX: End stage renal disease: N18.6

## 2012-01-02 NOTE — Progress Notes (Signed)
[facility]   Established Dialysis Access  History of Present Illness  [name] is a 46 y.o. (January 14, 1966) male who presents cc: no complaints.  The patient was sent for evaluation after bleeding from R [facility] AVF after HD.  Per pt they have only cannulated him twice.  Previous access procedures have been completed in both arms.  The patient's complication from previous access procedures include: thrombosis.  The patient has never had a previous PPM placed.  The pt notes flow rates > 2000 on HD  Past Medical History, Past Surgical History, Social History, Family History, Medications, Allergies, and Review of Systems are unchanged from previous visit on 09/04/11.  Physical Examination  Filed Vitals:   01/02/12 1006  BP: 165/86  Pulse: 75  Temp: 98.2 F (36.8 C)  TempSrc: Oral  Height: 5\' 11"  (1.803 m)  Weight: 158 lb (71.668 kg)   Body mass index is 22.04 kg/(m^2).  General: A&O x 3, WDWN  Pulmonary: Sym exp, good air movt, CTAB, no rales, rhonchi, & wheezing  Cardiac: RRR, Nl S1, S2, no Murmurs, rubs or gallops  Gastrointestinal: soft, NTND, -G/R, - HSM, - masses, - CVAT B  Musculoskeletal: M/S 5/5 throughout , Extremities without  ischemic changes , R upper arm thrill and bruit, no obvious pseudoaneurysms  Neurologic: Pain and light touch intact in extremities , Motor exam as listed above  Non-Invasive Vascular Imaging  RUE access duplex (Date: 01/02/12):   R BC AVF: 3.7-5.5 mm in diameter with depth < 6 mm, mid segment stenosis with PSV 482 c/s  Medical Decision Making  [name] is a 46 y.o. male who presents with ESRD requiring hemodialysis with R BC AVF with possible venous stenosis  While there may be a stenosis in the fistula, the exam is consistent with patent functional fistula.  R arm fistulogram with intervention is indicated, but the patient wants to see if he has any further problems on hemodialysis prior to  proceeding with such.  Once the pt is ready to proceed, I would schedule him for such.  Leonides Sake, MD [facility] [place] Office: [phone] Pager: [phone]  01/02/2012, 10:37 AM

## 2012-01-05 ENCOUNTER — Encounter (HOSPITAL_COMMUNITY): Payer: Self-pay | Admitting: Emergency Medicine

## 2012-01-05 ENCOUNTER — Emergency Department (HOSPITAL_COMMUNITY)
Admission: EM | Admit: 2012-01-05 | Discharge: 2012-01-05 | Disposition: A | Discharge status: Home / Self Care | Payer: Medicare Other | Attending: Emergency Medicine | Admitting: Emergency Medicine

## 2012-01-05 DIAGNOSIS — Z21 Asymptomatic human immunodeficiency virus [HIV] infection status: Secondary | ICD-10-CM | POA: Insufficient documentation

## 2012-01-05 DIAGNOSIS — I10 Essential (primary) hypertension: Secondary | ICD-10-CM | POA: Insufficient documentation

## 2012-01-05 DIAGNOSIS — L509 Urticaria, unspecified: Secondary | ICD-10-CM | POA: Insufficient documentation

## 2012-01-05 DIAGNOSIS — E079 Disorder of thyroid, unspecified: Secondary | ICD-10-CM | POA: Insufficient documentation

## 2012-01-05 DIAGNOSIS — F17200 Nicotine dependence, unspecified, uncomplicated: Secondary | ICD-10-CM | POA: Insufficient documentation

## 2012-01-05 DIAGNOSIS — M1710 Unilateral primary osteoarthritis, unspecified knee: Secondary | ICD-10-CM | POA: Insufficient documentation

## 2012-01-05 DIAGNOSIS — Z79899 Other long term (current) drug therapy: Secondary | ICD-10-CM | POA: Insufficient documentation

## 2012-01-05 MED ORDER — EPINEPHRINE 0.3 MG/0.3ML IJ DEVI: 0.3000 mg | INTRAMUSCULAR | Status: DC | PRN

## 2012-01-05 MED ORDER — DIPHENHYDRAMINE HCL 50 MG/ML IJ SOLN
25.0000 mg | Freq: Once | INTRAMUSCULAR | Status: AC
  Administered 2012-01-05: 25 mg via INTRAMUSCULAR
  Filled 2012-01-05: qty 1

## 2012-01-05 MED ORDER — HYDROXYZINE PAMOATE 25 MG PO CAPS: 25.0000 mg | ORAL_CAPSULE | Freq: Three times a day (TID) | ORAL | Status: AC | PRN

## 2012-01-05 NOTE — ED Notes (Signed)
Pt reports itchy hives starting about 3 days ago, taking valcyclovir, affirms using a new cologne but sts not using it much. Also changed to [employer] but sts it never bothers him. No new meds.

## 2012-01-05 NOTE — Discharge Instructions (Signed)
Hives Hives (urticaria) are itchy, red, swollen patches on the skin. They may change size, shape, and location quickly and repeatedly. Hives that occur deeper in the skin can cause swelling of the hands, feet, and face. Hives may be an allergic reaction to something you or your child ate, touched, or put on the skin. Hives can also be a reaction to cold, heat, viral infections, medication, insect bites, or emotional stress. Often the cause is hard to find. Hives can come and go for several days to several weeks. Hives are not contagious. HOME CARE INSTRUCTIONS   If the cause of the hives is known, avoid exposure to that source.   To relieve itching and rash:   Apply cold compresses to the skin or take cool water baths. Do not take or give your child hot baths or showers because the warmth will make the itching worse.   The best medicine for hives is an antihistamine. An antihistamine will not cure hives, but it will reduce their severity. You can use an antihistamine available over the counter. This medicine may make your child sleepy. Teenagers should not drive while using this medicine.   Take or give an antihistamine every 6 hours until the hives are completely gone for 24 hours or as directed.   Your child may have other medications prescribed for itching. Give these as directed by your child's caregiver.   You or your child should wear loose fitting clothing, including undergarments. Skin irritations may make hives worse.   Follow-up as directed by your caregiver.  SEEK MEDICAL CARE IF:   You or your child still have considerable itching after taking the medication (prescribed or purchased over the counter).   Joint swelling or pain occurs.  SEEK IMMEDIATE MEDICAL CARE IF:   You have a fever.   Swollen lips or tongue are noticed.   There is difficulty with breathing, swallowing, or tightness in the throat or chest.   Abdominal pain develops.   Your child starts acting very  sick.  These may be the first signs of a life-threatening allergic reaction. THIS IS AN EMERGENCY. Call 911 for medical help. MAKE SURE YOU:   Understand these instructions.   Will watch your condition.   Will get help right away if you are not doing well or get worse.  Document Released: 09/15/2005 Document Revised: 09/04/2011 Document Reviewed: 05/05/2008 Exit[employer] Patient Information 2012 [employer]. 

## 2012-01-12 NOTE — Procedures (Unsigned)
VASCULAR LAB EXAM  INDICATION:  Complication of hemodialysis graft.  HISTORY: Diabetes: Cardiac: Hypertension:  EXAM:  Right arm AV fistula duplex  IMPRESSION: 1. Patent right brachial to cephalic arteriovenous fistula noted with     no internal vessel narrowing.  Velocity of 482 cm/sec noted in the     mid brachium level outflow vein which appears to be due to a change     in vessel diameter. 2. Antegrade flow is noted in the calcified right radial artery. 3. Depth, diameter, velocity, and patent branch diameter measurements     are noted on the attached worksheet.  ___________________________________________ Fransisco Hertz, MD  CH/MEDQ  D:  01/02/2012  T:  01/02/2012  Job:  [id number]

## 2012-01-15 NOTE — ED Provider Notes (Signed)
History    45yM with rash. Itchy. Can't remember where started. Onset about 3d ago. Only new exposure that can think of is a Radiographer, therapeutic. Never used before. No difficulty breathing or swallowing. No swelling. No acute GI complaints. No fever ro chills.  CSN: [id number]  Arrival date & time 01/05/12  1537   First MD Initiated Contact with Patient 01/05/12 1803      Chief Complaint  Patient presents with  . Rash    (Consider location/radiation/quality/duration/timing/severity/associated sxs/prior treatment) HPI  Past Medical History  Diagnosis Date  . HIV infection   . Chronic kidney disease   . DJD (degenerative joint disease)     right knee  . Tertiary syphilis   . Genital herpes   . Anal condyloma   . Thyroid disease   . Hypertension   . Family history of disseminated HSV infection     Past Surgical History  Procedure Date  . Av fistula placement 06/16/11    Right brachiocephalic AVF    No family history on file.  History  Substance Use Topics  . Smoking status: Current Everyday Smoker -- 0.5 packs/day for 15 years    Types: Cigarettes  . Smokeless tobacco: Never Used  . Alcohol Use: No      Review of Systems   Review of symptoms negative unless otherwise noted in HPI.   Allergies  Oxycodone-acetaminophen; Penicillins; and Tylenol-codeine  Home Medications   Current Outpatient Rx  Name Route Sig Dispense Refill  . LAMIVUDINE 10 MG/ML PO SOLN Oral Take 5 mLs (50 mg total) by mouth at bedtime. 240 mL 2  . LOPINAVIR-RITONAVIR 200-50 MG PO TABS Oral Take 2 tablets by mouth daily.      . REYATAZ 300 MG PO CAPS  TAKE 1 CAPSULE BY MOUTH DAILY 30 capsule 5  . RITONAVIR 100 MG PO TABS Oral Take 1 tablet (100 mg total) by mouth daily. 30 tablet prn  . TENOFOVIR DISOPROXIL FUMARATE 300 MG PO TABS Oral Take 1 tablet (300 mg total) by mouth every 7 (seven) days. 4 tablet prn  . VALACYCLOVIR HCL 500 MG PO TABS Oral Take 1 tablet (500 mg total) by mouth daily. 30  tablet prn  . EPINEPHRINE 0.3 MG/0.3ML IJ DEVI Intramuscular Inject 0.3 mLs (0.3 mg total) into the muscle as needed. 2 Device 0  . HYDROXYZINE PAMOATE 25 MG PO CAPS Oral Take 1 capsule (25 mg total) by mouth 3 (three) times daily as needed for itching. 30 capsule 0    BP 185/87  Pulse 83  Temp(Src) 98.6 F (37 C) (Oral)  Resp 20  SpO2 100%  Physical Exam  Nursing note and vitals reviewed. Constitutional: He appears well-developed and well-nourished. No distress.  HENT:  Head: Normocephalic and atraumatic.  Eyes: Conjunctivae are normal. Right eye exhibits no discharge. Left eye exhibits no discharge.  Neck: Neck supple.  Cardiovascular: Normal rate, regular rhythm and normal heart sounds.  Exam reveals no gallop and no friction rub.   No murmur heard. Pulmonary/Chest: Effort normal and breath sounds normal. No respiratory distress.  Abdominal: Soft. He exhibits no distension. There is no tenderness.  Musculoskeletal: He exhibits no edema and no tenderness.  Neurological: He is alert.  Skin: Skin is warm and dry. Rash noted.       Slightly raised erythematous rash. Blanches. Appears urticarial. Generalized.  Psychiatric: He has a normal mood and affect. His behavior is normal. Thought content normal.    ED Course  Procedures (including critical care  time)  Labs Reviewed - No data to display No results found.   1. Urticaria       MDM  45yM with rash. Appears urticarial. No evidence of airway compromise and has remained stable through out ED stay. Plan symptomatic tx. Return precautions discussed.        Raeford Razor, MD 01/15/12 [phone]

## 2012-01-31 ENCOUNTER — Emergency Department (HOSPITAL_COMMUNITY)
Admission: EM | Admit: 2012-01-31 | Discharge: 2012-01-31 | Disposition: A | Discharge status: Home / Self Care | Payer: Medicare Other | Attending: Emergency Medicine | Admitting: Emergency Medicine

## 2012-01-31 ENCOUNTER — Emergency Department (HOSPITAL_COMMUNITY): Payer: Medicare Other

## 2012-01-31 ENCOUNTER — Encounter (HOSPITAL_COMMUNITY): Payer: Self-pay | Admitting: Emergency Medicine

## 2012-01-31 DIAGNOSIS — Z79899 Other long term (current) drug therapy: Secondary | ICD-10-CM | POA: Insufficient documentation

## 2012-01-31 DIAGNOSIS — Z21 Asymptomatic human immunodeficiency virus [HIV] infection status: Secondary | ICD-10-CM | POA: Insufficient documentation

## 2012-01-31 DIAGNOSIS — M25561 Pain in right knee: Secondary | ICD-10-CM

## 2012-01-31 DIAGNOSIS — M7989 Other specified soft tissue disorders: Secondary | ICD-10-CM | POA: Insufficient documentation

## 2012-01-31 DIAGNOSIS — M25569 Pain in unspecified knee: Secondary | ICD-10-CM | POA: Insufficient documentation

## 2012-01-31 MED ORDER — HYDROCODONE-ACETAMINOPHEN 5-500 MG PO TABS: 1.0000 | ORAL_TABLET | Freq: Four times a day (QID) | ORAL | Status: DC | PRN

## 2012-01-31 NOTE — Discharge Instructions (Signed)
Make sure to take pain medication with food on stomach to prevent nausea. You may continue ibuprofen use for additional pain relief. Do not drive or operate machinery with hydrocodone-acetaminophen use. Followup with orthopedic specialist at [facility] is very important for further evaluation of your right knee pain however return to the emergency department for any emergent changing or worsening of symptoms.  Knee Pain The knee is the complex joint between your thigh and your lower leg. It is made up of bones, tendons, ligaments, and cartilage. The bones that make up the knee are:  The femur in the thigh.   The tibia and fibula in the lower leg.   The patella or kneecap riding in the groove on the lower femur.  CAUSES  Knee pain is a common complaint with many causes. A few of these causes are:  Injury, such as:   A ruptured ligament or tendon injury.   Torn cartilage.   Medical conditions, such as:   Gout   Arthritis   Infections   Overuse, over training or overdoing a physical activity.  Knee pain can be minor or severe. Knee pain can accompany debilitating injury. Minor knee problems often respond well to self-care measures or get well on their own. More serious injuries may need medical intervention or even surgery. SYMPTOMS The knee is complex. Symptoms of knee problems can vary widely. Some of the problems are:  Pain with movement and weight bearing.   Swelling and tenderness.   Buckling of the knee.   Inability to straighten or extend your knee.   Your knee locks and you cannot straighten it.   Warmth and redness with pain and fever.   Deformity or dislocation of the kneecap.  DIAGNOSIS  Determining what is wrong may be very straight forward such as when there is an injury. It can also be challenging because of the complexity of the knee. Tests to make a diagnosis may include:  Your caregiver taking a history and doing a physical exam.   Routine  X-rays can be used to rule out other problems. X-rays will not reveal a cartilage tear. Some injuries of the knee can be diagnosed by:   Arthroscopy a surgical technique by which a small video camera is inserted through tiny incisions on the sides of the knee. This procedure is used to examine and repair internal knee joint problems. Tiny instruments can be used during arthroscopy to repair the torn knee cartilage (meniscus).   Arthrography is a radiology technique. A contrast liquid is directly injected into the knee joint. Internal structures of the knee joint then become visible on X-ray film.   An MRI scan is a non x-ray radiology procedure in which magnetic fields and a computer produce two- or three-dimensional images of the inside of the knee. Cartilage tears are often visible using an MRI scanner. MRI scans have largely replaced arthrography in diagnosing cartilage tears of the knee.   Blood work.   Examination of the fluid that helps to lubricate the knee joint (synovial fluid). This is done by taking a sample out using a needle and a syringe.  TREATMENT The treatment of knee problems depends on the cause. Some of these treatments are:  Depending on the injury, proper casting, splinting, surgery or physical therapy care will be needed.   Give yourself adequate recovery time. Do not overuse your joints. If you begin to get sore during workout routines, back off. Slow down or do fewer repetitions.   For  repetitive activities such as cycling or running, maintain your strength and nutrition.   Alternate muscle groups. For example if you are a weight lifter, work the upper body on one day and the lower body the next.   Either tight or weak muscles do not give the proper support for your knee. Tight or weak muscles do not absorb the stress placed on the knee joint. Keep the muscles surrounding the knee strong.   Take care of mechanical problems.   If you have flat feet, orthotics or  special shoes may help. See your caregiver if you need help.   Arch supports, sometimes with wedges on the inner or outer aspect of the heel, can help. These can shift pressure away from the side of the knee most bothered by osteoarthritis.   A brace called an "unloader" brace also may be used to help ease the pressure on the most arthritic side of the knee.   If your caregiver has prescribed crutches, braces, wraps or ice, use as directed. The acronym for this is PRICE. This means protection, rest, ice, compression and elevation.   Nonsteroidal anti-inflammatory drugs (NSAID's), can help relieve pain. But if taken immediately after an injury, they may actually increase swelling. Take NSAID's with food in your stomach. Stop them if you develop stomach problems. Do not take these if you have a history of ulcers, stomach pain or bleeding from the bowel. Do not take without your caregiver's approval if you have problems with fluid retention, heart failure, or kidney problems.   For ongoing knee problems, physical therapy may be helpful.   Glucosamine and chondroitin are over-the-counter dietary supplements. Both may help relieve the pain of osteoarthritis in the knee. These medicines are different from the usual anti-inflammatory drugs. Glucosamine may decrease the rate of cartilage destruction.   Injections of a corticosteroid drug into your knee joint may help reduce the symptoms of an arthritis flare-up. They may provide pain relief that lasts a few months. You may have to wait a few months between injections. The injections do have a small increased risk of infection, water retention and elevated blood sugar levels.   Hyaluronic acid injected into damaged joints may ease pain and provide lubrication. These injections may work by reducing inflammation. A series of shots may give relief for as long as 6 months.   Topical painkillers. Applying certain ointments to your skin may help relieve the pain  and stiffness of osteoarthritis. Ask your pharmacist for suggestions. Many over the-counter products are approved for temporary relief of arthritis pain.   In some countries, doctors often prescribe topical NSAID's for relief of chronic conditions such as arthritis and tendinitis. A review of treatment with NSAID creams found that they worked as well as oral medications but without the serious side effects.  PREVENTION  Maintain a healthy weight. Extra pounds put more strain on your joints.   Get strong, stay limber. Weak muscles are a common cause of knee injuries. Stretching is important. Include flexibility exercises in your workouts.   Be smart about exercise. If you have osteoarthritis, chronic knee pain or recurring injuries, you may need to change the way you exercise. This does not mean you have to stop being active. If your knees ache after jogging or playing basketball, consider switching to swimming, water aerobics or other low-impact activities, at least for a few days a week. Sometimes limiting high-impact activities will provide relief.   Make sure your shoes fit well. Choose footwear that  is right for your sport.   Protect your knees. Use the proper gear for knee-sensitive activities. Use kneepads when playing volleyball or laying carpet. Buckle your seat belt every time you drive. Most shattered kneecaps occur in car accidents.   Rest when you are tired.  SEEK MEDICAL CARE IF:  You have knee pain that is continual and does not seem to be getting better.  SEEK IMMEDIATE MEDICAL CARE IF:  Your knee joint feels hot to the touch and you have a high fever. MAKE SURE YOU:   Understand these instructions.   Will watch your condition.   Will get help right away if you are not doing well or get worse.  Document Released: 07/13/2007 Document Revised: 09/04/2011 Document Reviewed: 07/13/2007 [facility] Patient Information 2012 [place], [place].

## 2012-01-31 NOTE — ED Provider Notes (Signed)
Medical screening examination/treatment/procedure(s) were performed by non-physician practitioner and as supervising physician I was immediately available for consultation/collaboration.  Gerhard Munch, MD 01/31/12 1535

## 2012-01-31 NOTE — ED Provider Notes (Signed)
History     CSN: [id number]  Arrival date & time 01/31/12  8250   First MD Initiated Contact with Patient 01/31/12 [phone]      Chief Complaint  Patient presents with  . Knee Pain    Right    (Consider location/radiation/quality/duration/timing/severity/associated sxs/prior treatment) HPI  Patient presents to emergency department complaining of a one-week history of right lateral knee pain. Patient states he has history of similar pain in the past that waxes and wanes. Patient states that over the last week he's been taking ibuprofen with good relief of pain but that he took ibuprofen today without relief of pain. He denies any redness, heat, or swelling of his knee. He denies known injury to his knee. Patient states that he has paperwork to call [facility] to establish a followup as given by his nephrologist and dialysis Center. Patient denies fevers or chills. Patient states pain is aggravated by movement and touch. Pain is improved by rest and had been improved by ibuprofen however did not improve with ibuprofen today. Patient states he drove himself to the emergency department. Patient has no further complaints. He denies lower extremity numbness or tingling.  Past Medical History  Diagnosis Date  . HIV infection   . Chronic kidney disease   . DJD (degenerative joint disease)     right knee  . Tertiary syphilis   . Genital herpes   . Anal condyloma   . Thyroid disease   . Hypertension   . Family history of disseminated HSV infection   . Renal disorder   . Abdominal abscess     Past Surgical History  Procedure Date  . Av fistula placement 06/16/11    Right brachiocephalic AVF  . Appendectomy   . Abscess drainage abdominal abscess    History reviewed. No pertinent family history.  History  Substance Use Topics  . Smoking status: Current Everyday Smoker -- 0.5 packs/day for 15 years    Types: Cigarettes  . Smokeless tobacco: Never Used  . Alcohol Use: No       Review of Systems  All other systems reviewed and are negative.    Allergies  Acetaminophen-codeine; Oxycodone-acetaminophen; and Penicillins  Home Medications   Current Outpatient Rx  Name Route Sig Dispense Refill  . ATAZANAVIR SULFATE 300 MG PO CAPS Oral Take 300 mg by mouth daily.    Marland Kitchen LAMIVUDINE 10 MG/ML PO SOLN Oral Take 50 mg by mouth at bedtime.    Marland Kitchen LOPINAVIR-RITONAVIR 200-50 MG PO TABS Oral Take 2 tablets by mouth daily.      Marland Kitchen PRESCRIPTION MEDICATION Oral Take 1 tablet by mouth as needed. For itching    . RITONAVIR 100 MG PO CAPS Oral Take 100 mg by mouth daily.    . TENOFOVIR DISOPROXIL FUMARATE 300 MG PO TABS Oral Take 300 mg by mouth 2 (two) times a week. Tuesday and thursday    . VALACYCLOVIR HCL 500 MG PO TABS Oral Take 1 tablet (500 mg total) by mouth daily. 30 tablet prn  . HYDROCODONE-ACETAMINOPHEN 5-500 MG PO TABS Oral Take 1-2 tablets by mouth every 6 (six) hours as needed for pain. 15 tablet 0    BP 176/88  Pulse 76  Temp(Src) 98.1 F (36.7 C) (Oral)  Resp 20  SpO2 99%  Physical Exam  Nursing note and vitals reviewed. Constitutional: He is oriented to person, place, and time. He appears well-developed.  HENT:  Head: Normocephalic and atraumatic.  Eyes: Conjunctivae are normal.  Neck: Normal  range of motion. Neck supple.  Cardiovascular: Normal rate.   Pulmonary/Chest: Effort normal.  Musculoskeletal: Normal range of motion. He exhibits tenderness. He exhibits no edema.       TTP of right lateral knee without laxity of ant/post/med/lateral stress. Negative bollotment. Pain with FROM but no crepitous. No heat or erythema of knee. FROM of bilateral hips without pain. No TTP or remainder of lower extremities.   Neurological: He is alert and oriented to person, place, and time.  Skin: Skin is warm and dry. No rash noted. No erythema. No pallor.    ED Course  Procedures (including critical care time)  Labs Reviewed - No data to display Dg Knee  Complete 4 Views Right  01/31/2012  *RADIOLOGY REPORT*  Clinical Data: Pain after trauma, swelling  RIGHT KNEE - COMPLETE 4+ VIEW  Comparison: None.  Findings: There is no evidence of fracture, dislocation, or suprapatellar effusion.  Mild osteophytosis is present.  There are vascular calcifications posteriorly.  The joint compartments are maintained.  IMPRESSION: There is no evidence of acute osseous injury.  If there is clinical concern regarding a meniscal or ligamentous injury, MRI may be of help.  Original Report Authenticated By: Brandon Melnick, M.D.     1. Knee pain, right       MDM  Right lateral knee pain without signs or symptoms of septic joint. No acute findings on x-ray. Patient Nicole Cella has paperwork to establish followup with [place] [facility]. No pain or tenderness to palpation of entire calf or thigh.        Lenon Oms [place], [place] 01/31/12 [phone]

## 2012-01-31 NOTE — ED Notes (Signed)
Pt c/o right knee pain onset early this week. Pt denies recent injury.

## 2012-02-10 ENCOUNTER — Emergency Department (HOSPITAL_COMMUNITY): Payer: Medicare Other

## 2012-02-10 ENCOUNTER — Other Ambulatory Visit: Payer: Self-pay | Admitting: Internal Medicine

## 2012-02-10 ENCOUNTER — Other Ambulatory Visit: Payer: Medicare Other

## 2012-02-10 ENCOUNTER — Telehealth: Payer: Self-pay | Admitting: *Deleted

## 2012-02-10 ENCOUNTER — Emergency Department (HOSPITAL_COMMUNITY)
Admission: EM | Admit: 2012-02-10 | Discharge: 2012-02-10 | Disposition: A | Discharge status: Home / Self Care | Payer: Medicare Other | Attending: Emergency Medicine | Admitting: Emergency Medicine

## 2012-02-10 ENCOUNTER — Encounter (HOSPITAL_COMMUNITY): Payer: Self-pay | Admitting: *Deleted

## 2012-02-10 DIAGNOSIS — N189 Chronic kidney disease, unspecified: Secondary | ICD-10-CM | POA: Insufficient documentation

## 2012-02-10 DIAGNOSIS — Z113 Encounter for screening for infections with a predominantly sexual mode of transmission: Secondary | ICD-10-CM

## 2012-02-10 DIAGNOSIS — I129 Hypertensive chronic kidney disease with stage 1 through stage 4 chronic kidney disease, or unspecified chronic kidney disease: Secondary | ICD-10-CM | POA: Insufficient documentation

## 2012-02-10 DIAGNOSIS — Z79899 Other long term (current) drug therapy: Secondary | ICD-10-CM | POA: Insufficient documentation

## 2012-02-10 DIAGNOSIS — Z21 Asymptomatic human immunodeficiency virus [HIV] infection status: Secondary | ICD-10-CM | POA: Insufficient documentation

## 2012-02-10 DIAGNOSIS — E079 Disorder of thyroid, unspecified: Secondary | ICD-10-CM | POA: Insufficient documentation

## 2012-02-10 DIAGNOSIS — L509 Urticaria, unspecified: Secondary | ICD-10-CM | POA: Insufficient documentation

## 2012-02-10 DIAGNOSIS — J069 Acute upper respiratory infection, unspecified: Secondary | ICD-10-CM

## 2012-02-10 LAB — COMPREHENSIVE METABOLIC PANEL
ALT: 35 U/L (ref 0–53)
CO2: 29 mEq/L (ref 19–32)
Calcium: 9.4 mg/dL (ref 8.4–10.5)
Chloride: 96 mEq/L (ref 96–112)
GFR calc Af Amer: 8 mL/min — ABNORMAL LOW (ref >90)
GFR calc non Af Amer: 7 mL/min — ABNORMAL LOW (ref >90)
Glucose, Bld: 103 mg/dL — ABNORMAL HIGH (ref 70–99)
Sodium: 141 mEq/L (ref 135–145)
Total Bilirubin: 0.7 mg/dL (ref 0.3–1.2)

## 2012-02-10 LAB — CBC
HCT: 44.9 % (ref 39.0–52.0)
MCV: 100.2 fL — ABNORMAL HIGH (ref 78.0–100.0)
Platelets: 111 10*3/uL — ABNORMAL LOW (ref 150–400)
RBC: 4.48 MIL/uL (ref 4.22–5.81)
WBC: 6.9 10*3/uL (ref 4.0–10.5)

## 2012-02-10 LAB — DIFFERENTIAL
Eosinophils Relative: 2 % (ref 0–5)
Lymphocytes Relative: 23 % (ref 12–46)
Lymphs Abs: 1.6 10*3/uL (ref 0.7–4.0)
Monocytes Absolute: 0.7 10*3/uL (ref 0.1–1.0)

## 2012-02-10 MED ORDER — HYDROXYZINE HCL 25 MG PO TABS
25.0000 mg | ORAL_TABLET | Freq: Once | ORAL | Status: AC
  Administered 2012-02-10: 25 mg via ORAL
  Filled 2012-02-10: qty 1

## 2012-02-10 MED ORDER — OXYMETAZOLINE HCL 0.05 % NA SOLN
2.0000 | Freq: Once | NASAL | Status: AC
  Administered 2012-02-10: 2 via NASAL
  Filled 2012-02-10: qty 15

## 2012-02-10 MED ORDER — HYDROXYZINE HCL 25 MG PO TABS: 25.0000 mg | ORAL_TABLET | Freq: Three times a day (TID) | ORAL | Status: AC | PRN

## 2012-02-10 NOTE — ED Notes (Signed)
Lab tech at bedside attempting to draw labs.

## 2012-02-10 NOTE — Discharge Instructions (Signed)
Hives Hives (urticaria) are itchy, red, swollen patches on the skin. They may change size, shape, and location quickly and repeatedly. Hives that occur deeper in the skin can cause swelling of the hands, feet, and face. Hives may be an allergic reaction to something you or your child ate, touched, or put on the skin. Hives can also be a reaction to cold, heat, viral infections, medication, insect bites, or emotional stress. Often the cause is hard to find. Hives can come and go for several days to several weeks. Hives are not contagious. HOME CARE INSTRUCTIONS   If the cause of the hives is known, avoid exposure to that source.   To relieve itching and rash:   Apply cold compresses to the skin or take cool water baths. Do not take or give your child hot baths or showers because the warmth will make the itching worse.   The best medicine for hives is an antihistamine. An antihistamine will not cure hives, but it will reduce their severity. You can use an antihistamine available over the counter. This medicine may make your child sleepy. Teenagers should not drive while using this medicine.   Take or give an antihistamine every 6 hours until the hives are completely gone for 24 hours or as directed.   Your child may have other medications prescribed for itching. Give these as directed by your child's caregiver.   You or your child should wear loose fitting clothing, including undergarments. Skin irritations may make hives worse.   Follow-up as directed by your caregiver.  SEEK MEDICAL CARE IF:   You or your child still have considerable itching after taking the medication (prescribed or purchased over the counter).   Joint swelling or pain occurs.  SEEK IMMEDIATE MEDICAL CARE IF:   You have a fever.   Swollen lips or tongue are noticed.   There is difficulty with breathing, swallowing, or tightness in the throat or chest.   Abdominal pain develops.   Your child starts acting very  sick.  These may be the first signs of a life-threatening allergic reaction. THIS IS AN EMERGENCY. Call 911 for medical help. MAKE SURE YOU:   Understand these instructions.   Will watch your condition.   Will get help right away if you are not doing well or get worse.  Document Released: 09/15/2005 Document Revised: 09/04/2011 Document Reviewed: 05/05/2008 [facility] Patient Information 2012 [place], [place].Upper Respiratory Infection, Adult An upper respiratory infection (URI) is also known as the common cold. It is often caused by a type of germ (virus). Colds are easily spread (contagious). You can pass it to others by kissing, coughing, sneezing, or drinking out of the same glass. Usually, you get better in 1 or 2 weeks.  HOME CARE   Only take medicine as told by your doctor.   Use a warm mist humidifier or breathe in steam from a hot shower.   Drink enough water and fluids to keep your pee (urine) clear or pale yellow.   Get plenty of rest.   Return to work when your temperature is back to normal or as told by your doctor. You may use a face mask and wash your hands to stop your cold from spreading.  GET HELP RIGHT AWAY IF:   After the first few days, you feel you are getting worse.   You have questions about your medicine.   You have chills, shortness of breath, or brown or red spit (mucus).   You have yellow  or brown snot (nasal discharge) or pain in the face, especially when you bend forward.   You have a fever, puffy (swollen) neck, pain when you swallow, or white spots in the back of your throat.   You have a bad headache, ear pain, sinus pain, or chest pain.   You have a high-pitched whistling sound when you breathe in and out (wheezing).   You have a lasting cough or cough up blood.   You have sore muscles or a stiff neck.  MAKE SURE YOU:   Understand these instructions.   Will watch your condition.   Will get help right away if you are not doing well or get  worse.  Document Released: 03/03/2008 Document Revised: 09/04/2011 Document Reviewed: 01/20/2011 [facility] Patient Information 2012 [place], [place].

## 2012-02-10 NOTE — ED Notes (Signed)
Pt in c/o generalized hives since 5/4 and was seen for same at that time and given benadryl, states hives continue, also cold symptoms x3 days with congestion and cough, and chills

## 2012-02-10 NOTE — ED Provider Notes (Signed)
History    Scribed for Caleb Numbers, MD, the patient was seen in room STRE1/STRE1. This chart was scribed by Katha Cabal.  CSN: [id number]  Arrival date & time 02/10/12  1442   First MD Initiated Contact with Patient 02/10/12 1556      Chief Complaint  Patient presents with  . URI  . Hives     (Consider location/radiation/quality/duration/timing/severity/associated sxs/prior treatment) HPI Caleb Numbers, MD entered patient's room at 3:58 PM   KYZEN HORN is a 46 y.o. male who presents to the Emergency Department complaining of persistent diffuse itchy hives for past month.  Patient reports hives previously but cause unknown.  Patient states he has been under stress recently.   Patient reports cough, fever, nasal congestion and shortness of breath for past 3 days.  Patient taking Benadryl and OTC cold medicine without relief.   Patient is a dialysis patient.    There is no history of diabetes mellitus, asthma or steroid use. Denies fevers or chest pain.  No pain at this time per patient just irritation from all of his symptoms.     PCP Irena Cords, MD, MD     Past Medical History  Diagnosis Date  . HIV infection   . Chronic kidney disease   . DJD (degenerative joint disease)     right knee  . Tertiary syphilis   . Genital herpes   . Anal condyloma   . Thyroid disease   . Hypertension   . Family history of disseminated HSV infection   . Renal disorder   . Abdominal abscess     Past Surgical History  Procedure Date  . Av fistula placement 06/16/11    Right brachiocephalic AVF  . Appendectomy   . Abscess drainage abdominal abscess    History reviewed. No pertinent family history.  History  Substance Use Topics  . Smoking status: Current Everyday Smoker -- 0.5 packs/day for 15 years    Types: Cigarettes  . Smokeless tobacco: Never Used  . Alcohol Use: No      Review of Systems  Constitutional: Positive for fatigue.  HENT: Positive for  congestion, sore throat, sneezing and postnasal drip.   Eyes: Negative.   Respiratory: Positive for cough.   Cardiovascular: Negative.   Gastrointestinal: Negative.   Genitourinary: Negative.   Musculoskeletal: Negative.   Skin: Positive for rash.  Neurological: Negative.   Hematological: Negative.   Psychiatric/Behavioral: Negative.   All other systems reviewed and are negative.  Remaining review of systems negative except as noted in the HPI.    Allergies  Acetaminophen-codeine; Oxycodone-acetaminophen; and Penicillins  Home Medications   Current Outpatient Rx  Name Route Sig Dispense Refill  . ATAZANAVIR SULFATE 300 MG PO CAPS Oral Take 300 mg by mouth daily.    Marland Kitchen HYDROCODONE-ACETAMINOPHEN 5-500 MG PO TABS Oral Take 1-2 tablets by mouth every 6 (six) hours as needed. For pain    . LAMIVUDINE 10 MG/ML PO SOLN Oral Take 50 mg by mouth at bedtime.    Marland Kitchen LOPINAVIR-RITONAVIR 200-50 MG PO TABS Oral Take 2 tablets by mouth daily.      Marland Kitchen PRESCRIPTION MEDICATION Oral Take 1 tablet by mouth 3 (three) times daily as needed. For itching    . RITONAVIR 100 MG PO CAPS Oral Take 100 mg by mouth daily.    . TENOFOVIR DISOPROXIL FUMARATE 300 MG PO TABS Oral Take 300 mg by mouth 2 (two) times a week. Tuesday and thursday    . VALACYCLOVIR HCL  500 MG PO TABS Oral Take 500 mg by mouth daily as needed. For herpes    . HYDROXYZINE HCL 25 MG PO TABS Oral Take 1 tablet (25 mg total) by mouth every 8 (eight) hours as needed for itching. 30 tablet 0    BP 138/81  Pulse 78  Temp(Src) 99.2 F (37.3 C) (Oral)  Resp 16  SpO2 95%  Physical Exam  Nursing note and vitals reviewed. Constitutional: He is oriented to person, place, and time. He appears well-developed and well-nourished. No distress.  HENT:  Head: Normocephalic and atraumatic.  Eyes: EOM are normal.  Neck: Neck supple. No tracheal deviation present.  Cardiovascular: Normal rate, regular rhythm and normal heart sounds.     Pulmonary/Chest: Effort normal and breath sounds normal. No respiratory distress.       diminished breath sounds throughout   Abdominal: Soft. Bowel sounds are normal. He exhibits no distension. There is no tenderness. There is no rebound and no guarding.  Musculoskeletal: Normal range of motion.  Neurological: He is alert and oriented to person, place, and time. No cranial nerve deficit (cranial nerves 2-12 intact ). He exhibits normal muscle tone. Coordination normal.  Skin: Skin is warm and dry. Rash noted.       Diffuse urticarial rash which blanches with palpation and consists of diffuse truncal and extremity lesions that are raised and erythematous wheals  Psychiatric: He has a normal mood and affect. His behavior is normal.    ED Course  Procedures (including critical care time)   DIAGNOSTIC STUDIES: Oxygen Saturation is 97% on room air, normal by my interpretation.     COORDINATION OF CARE: 4:08 PM  Physical exam complete.  Will order CXR and labs.   4:15 PM  Hydroxyzine ordered.     Orders Placed This Encounter  Procedures  . DG Chest 2 View  . CBC  . Differential  . Comprehensive metabolic panel  . Medication reconciliation pending pharmacist review  . Insert peripheral IV      LABS / RADIOLOGY:  Labs Reviewed  CBC - Abnormal; Notable for the following:    MCV 100.2 (*)    MCH 34.8 (*)    Platelets 111 (*) PLATELET COUNT CONFIRMED BY SMEAR   All other components within normal limits  COMPREHENSIVE METABOLIC PANEL - Abnormal; Notable for the following:    Potassium 5.3 (*)    Glucose, Bld 103 (*)    Creatinine, Ser 8.70 (*)    Total Protein 8.6 (*)    Alkaline Phosphatase 395 (*)    GFR calc non Af Amer 7 (*)    GFR calc Af Amer 8 (*)    All other components within normal limits  DIFFERENTIAL   Dg Chest 2 View  02/10/2012  *RADIOLOGY REPORT*  Clinical Data: Congestion, cough, shortness of breath, stuffiness, hives, history smoking, end-stage renal  disease on dialysis, hypertension  CHEST - 2 VIEW  Comparison: 06/23/2011  Findings: Pericardiac calcifications at right heart border stable. Upper-normal size of cardiac silhouette. Slight pulmonary vascular congestion. Mediastinal contours normal. No definite infiltrate, pleural effusion or pneumothorax. Minimal chronic peribronchial thickening. No acute osseous findings. Left subclavian stent identified, questionably demonstrating increased kinking.  IMPRESSION: Chronic bronchitic changes. No acute abnormalities. Question increased kinking of a left subclavian stent.  Original Report Authenticated By: Lollie Marrow, M.D.         MDM  Patient presents with rash 1 month in duration as well as 3 days of URI symptoms.  Patient had negative CXR and unremarkable CBC and renal, elevated K to 5.3 was certainly due to hemolysis.  The patient felt better after Afrin and he was given atarax which improved both his pruritus and rash.  Patient was discharged with both of these meds and instructions only to use Afrin for 3 days.  Patient was discharged in good condition.         MEDICATIONS GIVEN IN THE E.D. Scheduled Meds:    . hydrOXYzine  25 mg Oral Once  . oxymetazoline  2 spray Each Nare Once   Continuous Infusions:      IMPRESSION: 1. Hives   2. Acute URI      NEW MEDICATIONS: New Prescriptions   HYDROXYZINE (ATARAX/VISTARIL) 25 MG TABLET    Take 1 tablet (25 mg total) by mouth every 8 (eight) hours as needed for itching.      I personally performed the services described in this documentation, which was scribed in my presence. The recorded information has been reviewed and considered.           Caleb Numbers, MD 02/10/12 2124

## 2012-02-10 NOTE — Telephone Encounter (Signed)
States he started having hives about 2 weeks ago. Unable to relate to foods or meds, cologne or detergent.  He is on dialysis. Per chart he was seen in ED for this 01/05/12. Was told to take benadryl. Last dose was 25mg  yesterday. Benadryl helps some when he takes it. Told me he take it BID . Denies breathing problems. Multiple large flat uticaria seen on neck,back and l arm. Scratching constantly except when he takes the benadryl, then he sleeps. Last OV or labs were 03/2011. Discussed with Tomasita Morrow, RN. Sent to ED. States he was here for labs but wanted to go to ED now & will get labs tomorrow. Front desk to change the appt.

## 2012-02-11 ENCOUNTER — Other Ambulatory Visit: Payer: Medicare Other

## 2012-02-11 DIAGNOSIS — Z13220 Encounter for screening for lipoid disorders: Secondary | ICD-10-CM

## 2012-02-11 DIAGNOSIS — Z113 Encounter for screening for infections with a predominantly sexual mode of transmission: Secondary | ICD-10-CM

## 2012-02-11 DIAGNOSIS — B20 Human immunodeficiency virus [HIV] disease: Secondary | ICD-10-CM

## 2012-02-11 DIAGNOSIS — Z79899 Other long term (current) drug therapy: Secondary | ICD-10-CM

## 2012-02-12 ENCOUNTER — Telehealth: Payer: Self-pay | Admitting: *Deleted

## 2012-02-12 LAB — CBC
MCH: 33.8 pg (ref 26.0–34.0)
MCHC: 34.2 g/dL (ref 30.0–36.0)
MCV: 99 fL (ref 78.0–100.0)
Platelets: 123 10*3/uL — ABNORMAL LOW (ref 150–400)
RDW: 16.5 % — ABNORMAL HIGH (ref 11.5–15.5)
WBC: 6.2 10*3/uL (ref 4.0–10.5)

## 2012-02-12 LAB — COMPREHENSIVE METABOLIC PANEL
Albumin: 3.9 g/dL (ref 3.5–5.2)
CO2: 29 mEq/L (ref 19–32)
Calcium: 8.2 mg/dL — ABNORMAL LOW (ref 8.4–10.5)
Glucose, Bld: 104 mg/dL — ABNORMAL HIGH (ref 70–99)
Potassium: 4.8 mEq/L (ref 3.5–5.3)
Sodium: 141 mEq/L (ref 135–145)
Total Protein: 7 g/dL (ref 6.0–8.3)

## 2012-02-12 LAB — LIPID PANEL
Cholesterol: 211 mg/dL — ABNORMAL HIGH (ref 0–200)
Triglycerides: 116 mg/dL (ref <150)

## 2012-02-12 LAB — T-HELPER CELL (CD4) - (RCID CLINIC ONLY): CD4 % Helper T Cell: 29 % — ABNORMAL LOW (ref 33–55)

## 2012-02-12 NOTE — Telephone Encounter (Signed)
Scarlette Calico from [place] called to report a critical high of his creatinine. It is 10.78. His levels have been higher than this. He is an ESRD py. Reported to md

## 2012-02-24 ENCOUNTER — Ambulatory Visit (INDEPENDENT_AMBULATORY_CARE_PROVIDER_SITE_OTHER): Payer: Medicare Other | Admitting: Internal Medicine

## 2012-02-24 ENCOUNTER — Encounter: Payer: Self-pay | Admitting: Internal Medicine

## 2012-02-24 VITALS — BP 155/79 | HR 71 | Temp 97.8°F | Ht 71.0 in | Wt 160.0 lb

## 2012-02-24 DIAGNOSIS — B20 Human immunodeficiency virus [HIV] disease: Secondary | ICD-10-CM

## 2012-02-24 NOTE — Progress Notes (Signed)
Patient ID: [name], male   DOB: 30-Oct-1965, 46 y.o.   MRN: [id number]     [facility]  Patient Active Problem List  Diagnoses  . BACTEREMIA, MYCOBACTERIUM AVIUM COMPLEX  . HIV DISEASE  . HERPES, GENITAL NEC  . WARTS, OTHER SPECIFIED VIRAL  . SYPHILIS NOS  . CIGARETTE SMOKER  . DEPRESSION  . PERIPHERAL NEUROPATHY  . CONJUNCTIVITIS NEC  . RECTAL BLEEDING  . TRANSAMINASES, SERUM, ELEVATED  . ALKALINE PHOSPHATASE, ELEVATED  . Other complications due to renal dialysis device, implant, and graft  . ESRD (end stage renal disease) on dialysis    Patient's Medications  New Prescriptions   No medications on file  Previous Medications   ATAZANAVIR (REYATAZ) 300 MG CAPSULE    Take 300 mg by mouth daily.   CALCIUM CARBONATE 200 MG CAPSULE    Take 250 mg by mouth 2 (two) times daily with a meal.   HYDROXYZINE (ATARAX/VISTARIL) 25 MG TABLET    Take 25 mg by mouth 3 (three) times daily as needed.   IBUPROFEN (ADVIL,MOTRIN) 200 MG TABLET    Take 400 mg by mouth every 6 (six) hours as needed.   LAMIVUDINE (EPIVIR) 10 MG/ML SOLUTION    Take 50 mg by mouth at bedtime.   RITONAVIR (NORVIR) 100 MG CAPSULE    Take 100 mg by mouth daily.   TENOFOVIR (VIREAD) 300 MG TABLET    Take 300 mg by mouth 2 (two) times a week. Tuesday and thursday   VALACYCLOVIR (VALTREX) 500 MG TABLET    Take 500 mg by mouth daily as needed. For herpes  Modified Medications   No medications on file  Discontinued Medications   LOPINAVIR-RITONAVIR (KALETRA) 200-50 MG PER TABLET    Take 2 tablets by mouth daily.     PRESCRIPTION MEDICATION    Take 1 tablet by mouth 3 (three) times daily as needed. For itching    Subjective: [name] is in for his first visit since July of last year. He states that he did not realize it has been that long. He recalls missing only one dose of his HIV medicines since his last visit. That occurred when he was out of town longer than expected and ran out of  medication. He continues to use a pill box and fills out the pillbox every Wednesday. He has recertified for ADAP.  Objective: Temp: 97.8 F (36.6 C) (05/28 1025) Temp src: Oral (05/28 1025) BP: 155/79 mmHg (05/28 1025) Pulse Rate: 71  (05/28 1025)  General: He is in no distress and in good spirits Skin: No rash (he had hives recently which have now resolved) Lungs: Clear Cor: Regular S1 and S2 with no murmurs  Lab Results HIV 1 RNA Quant (copies/mL)  Date Value  02/11/2012 94*  03/31/2011 96*  11/11/2010 24*     CD4 T Cell Abs (cmm)  Date Value  02/11/2012 580   03/31/2011 570   11/11/2010 330*     Assessment: His HIV infection has come under much better control over the past 2 years since he restarted his antiretroviral medications. It appears that his adherence has improved significantly in the past few years. I will continue his current regimen. I have encouraged him to keep visits at no more than 6 month intervals.  Plan: 1. Continue current antiretroviral regimen 2. He was encouraged to quit smoking cigarettes 3. Followup after lab work in 6 months   Cliffton [name], MD [facility]  [facility] [phone] pager   [phone] cell 02/24/2012, 10:51 AM

## 2012-03-04 ENCOUNTER — Other Ambulatory Visit: Payer: Self-pay | Admitting: Internal Medicine

## 2012-05-18 ENCOUNTER — Other Ambulatory Visit: Payer: Self-pay | Admitting: Infectious Diseases

## 2012-07-19 ENCOUNTER — Other Ambulatory Visit: Payer: Self-pay | Admitting: *Deleted

## 2012-07-19 DIAGNOSIS — B20 Human immunodeficiency virus [HIV] disease: Secondary | ICD-10-CM

## 2012-07-19 MED ORDER — TENOFOVIR DISOPROXIL FUMARATE 300 MG PO TABS: 300.0000 mg | ORAL_TABLET | ORAL | Status: DC

## 2012-07-19 MED ORDER — RITONAVIR 100 MG PO CAPS: 100.0000 mg | ORAL_CAPSULE | Freq: Every day | ORAL | Status: DC

## 2012-07-19 MED ORDER — ATAZANAVIR SULFATE 300 MG PO CAPS: 300.0000 mg | ORAL_CAPSULE | Freq: Every day | ORAL | Status: DC

## 2012-07-19 MED ORDER — LAMIVUDINE 10 MG/ML PO SOLN: 50.0000 mg | Freq: Every day | ORAL | Status: DC

## 2012-07-20 ENCOUNTER — Ambulatory Visit: Payer: Medicare Other

## 2012-07-27 ENCOUNTER — Encounter (HOSPITAL_COMMUNITY): Payer: Self-pay | Admitting: *Deleted

## 2012-07-27 ENCOUNTER — Emergency Department (HOSPITAL_COMMUNITY)
Admission: EM | Admit: 2012-07-27 | Discharge: 2012-07-28 | Disposition: A | Discharge status: Other Acute Inpatient Hospital | Payer: Medicare Other | Attending: Emergency Medicine | Admitting: Emergency Medicine

## 2012-07-27 DIAGNOSIS — N189 Chronic kidney disease, unspecified: Secondary | ICD-10-CM | POA: Insufficient documentation

## 2012-07-27 DIAGNOSIS — B20 Human immunodeficiency virus [HIV] disease: Secondary | ICD-10-CM | POA: Insufficient documentation

## 2012-07-27 DIAGNOSIS — Z79899 Other long term (current) drug therapy: Secondary | ICD-10-CM | POA: Insufficient documentation

## 2012-07-27 DIAGNOSIS — Z8639 Personal history of other endocrine, nutritional and metabolic disease: Secondary | ICD-10-CM | POA: Insufficient documentation

## 2012-07-27 DIAGNOSIS — A6000 Herpesviral infection of urogenital system, unspecified: Secondary | ICD-10-CM | POA: Insufficient documentation

## 2012-07-27 DIAGNOSIS — M1710 Unilateral primary osteoarthritis, unspecified knee: Secondary | ICD-10-CM | POA: Insufficient documentation

## 2012-07-27 DIAGNOSIS — I10 Essential (primary) hypertension: Secondary | ICD-10-CM | POA: Insufficient documentation

## 2012-07-27 DIAGNOSIS — IMO0002 Reserved for concepts with insufficient information to code with codable children: Secondary | ICD-10-CM | POA: Insufficient documentation

## 2012-07-27 DIAGNOSIS — Z862 Personal history of diseases of the blood and blood-forming organs and certain disorders involving the immune mechanism: Secondary | ICD-10-CM | POA: Insufficient documentation

## 2012-07-27 DIAGNOSIS — R0602 Shortness of breath: Secondary | ICD-10-CM | POA: Insufficient documentation

## 2012-07-27 NOTE — ED Notes (Signed)
Pt on dialysis states T,T,sat. Pt went to diaysis today. Pt states head cold is making him feel SOB. Pt denies fluid overload feeling of SOB. Pt states just stuffy.

## 2012-07-29 ENCOUNTER — Other Ambulatory Visit: Payer: Self-pay | Admitting: Internal Medicine

## 2012-07-29 ENCOUNTER — Other Ambulatory Visit: Payer: Self-pay | Admitting: Infectious Diseases

## 2012-08-11 ENCOUNTER — Other Ambulatory Visit: Payer: Self-pay | Admitting: Internal Medicine

## 2012-08-11 ENCOUNTER — Other Ambulatory Visit: Payer: Self-pay | Admitting: *Deleted

## 2012-08-11 DIAGNOSIS — B20 Human immunodeficiency virus [HIV] disease: Secondary | ICD-10-CM

## 2012-08-11 MED ORDER — SULFAMETHOXAZOLE-TMP DS 800-160 MG PO TABS: 1.0000 | ORAL_TABLET | ORAL | Status: DC

## 2012-08-11 MED ORDER — VALACYCLOVIR HCL 500 MG PO TABS: 500.0000 mg | ORAL_TABLET | Freq: Every day | ORAL | Status: DC

## 2012-08-11 MED ORDER — AZITHROMYCIN 600 MG PO TABS: 1200.0000 mg | ORAL_TABLET | ORAL | Status: DC

## 2012-09-15 ENCOUNTER — Other Ambulatory Visit: Payer: Medicare Other

## 2012-09-17 ENCOUNTER — Other Ambulatory Visit: Payer: Medicare Other

## 2012-09-17 DIAGNOSIS — B20 Human immunodeficiency virus [HIV] disease: Secondary | ICD-10-CM

## 2012-09-19 LAB — HIV-1 RNA QUANT-NO REFLEX-BLD
HIV 1 RNA Quant: <20 copies/mL (ref <20)
HIV-1 RNA Quant, Log: <1.3 {Log} (ref <1.30)

## 2012-10-04 ENCOUNTER — Encounter: Payer: Self-pay | Admitting: Internal Medicine

## 2012-10-04 ENCOUNTER — Ambulatory Visit (INDEPENDENT_AMBULATORY_CARE_PROVIDER_SITE_OTHER): Payer: Medicare Other | Admitting: Internal Medicine

## 2012-10-04 VITALS — BP 130/75 | HR 67 | Temp 97.7°F | Ht 71.0 in | Wt 169.5 lb

## 2012-10-04 DIAGNOSIS — M1990 Unspecified osteoarthritis, unspecified site: Secondary | ICD-10-CM

## 2012-10-04 DIAGNOSIS — R1013 Epigastric pain: Secondary | ICD-10-CM

## 2012-10-04 DIAGNOSIS — Z79899 Other long term (current) drug therapy: Secondary | ICD-10-CM

## 2012-10-04 DIAGNOSIS — B20 Human immunodeficiency virus [HIV] disease: Secondary | ICD-10-CM

## 2012-10-04 MED ORDER — DARUNAVIR ETHANOLATE 800 MG PO TABS: 800.0000 mg | ORAL_TABLET | Freq: Every day | ORAL | Status: DC

## 2012-10-04 NOTE — Progress Notes (Signed)
Patient ID: [name], male   DOB: March 08, 1966, 47 y.o.   MRN: [id number]     [facility]  Patient Active Problem List  Diagnosis  . BACTEREMIA, MYCOBACTERIUM AVIUM COMPLEX  . HIV DISEASE  . HERPES, GENITAL NEC  . WARTS, OTHER SPECIFIED VIRAL  . SYPHILIS NOS  . CIGARETTE SMOKER  . DEPRESSION  . PERIPHERAL NEUROPATHY  . CONJUNCTIVITIS NEC  . RECTAL BLEEDING  . TRANSAMINASES, SERUM, ELEVATED  . ALKALINE PHOSPHATASE, ELEVATED  . Other complications due to renal dialysis device, implant, and graft  . ESRD (end stage renal disease) on dialysis  . DJD (degenerative joint disease)  . Epigastric pain    Patient's Medications  New Prescriptions   DARUNAVIR ETHANOLATE (PREZISTA) 800 MG TABLET    Take 1 tablet (800 mg total) by mouth daily with breakfast.  Previous Medications   ACETAMINOPHEN (TYLENOL ARTHRITIS PAIN PO)    Take by mouth.   CALCIUM CARBONATE 200 MG CAPSULE    Take 250 mg by mouth 2 (two) times daily with a meal.   HYDROXYZINE (ATARAX/VISTARIL) 25 MG TABLET    Take 25 mg by mouth 3 (three) times daily as needed.   IBUPROFEN (ADVIL,MOTRIN) 200 MG TABLET    Take 400 mg by mouth every 6 (six) hours as needed.   LAMIVUDINE (EPIVIR) 10 MG/ML SOLUTION    Take 5 mLs (50 mg total) by mouth at bedtime.   RITONAVIR (NORVIR) 100 MG CAPSULE    Take 1 capsule (100 mg total) by mouth daily.   TENOFOVIR (VIREAD) 300 MG TABLET    Take 1 tablet (300 mg total) by mouth 2 (two) times a week. Tuesday and thursday   VALACYCLOVIR (VALTREX) 500 MG TABLET    Take 1 tablet (500 mg total) by mouth daily.  Modified Medications   No medications on file  Discontinued Medications   ATAZANAVIR (REYATAZ) 300 MG CAPSULE    Take 1 capsule (300 mg total) by mouth daily.   AZITHROMYCIN (ZITHROMAX) 600 MG TABLET    Take 2 tablets (1,200 mg total) by mouth every 7 (seven) days.   SULFAMETHOXAZOLE-TRIMETHOPRIM (BACTRIM DS) 800-160 MG PER TABLET    Take 1 tablet by mouth 3  (three) times a week. Monday, Wednesday and Friday    Subjective: [name] is in for his routine visit. He has missed 2 days of his medications in the past 6 months when his pharmacy and [employer] were late delivering his refills. He states that he is doing well with exception of some mild epigastric pain recently. He started taking TUMS in addition to his routine calcium and notes that it has improved. He has not been on any proton pump inhibitors.  He has continued be bothered by intermittent right knee pain. He says that he seen several orthopedists in the past year (he does not recall their names). He takes ibuprofen and Tylenol arthritis intermittently and tries to stay active.  Objective: Temp: 97.7 F (36.5 C) (01/06 1353) Temp src: Oral (01/06 1353) BP: 130/75 mmHg (01/06 1353) Pulse Rate: 67  (01/06 1353)  General: He is in good spirits Skin: Small area of hyperpigmentation and dry skin at the base of his right thumb Oral: Dental caries Lungs: Clear Cor: Regular S1 and S2 with a 1-2/6 systolic murmur Abdomen: Mild epigastric tenderness. Normal bowel sounds. No masses palpated  Lab Results HIV 1 RNA Quant (copies/mL)  Date Value  09/17/2012 <20   02/11/2012 94*  03/31/2011 96*  CD4 T Cell Abs (cmm)  Date Value  09/17/2012 880   02/11/2012 580   03/31/2011 570      Assessment: His HIV infection remains under excellent control. I will change his Reyataz to Prezista in case he needs ongoing acid suppressive therapy which can reduce Reyataz levels. There is no need for him to continue taking prophylactic azithromycin or trimethoprim sulfamethoxazole. He probably has some mild dry skin at the base of his right thumb causing some itching and hyperpigmentation. He has already received his influenza vaccine. He continues to smoke cigarettes.  Plan: 1. Continue Epivir, Viread and Norvir  2. Change her Reyataz to Prezista 3. Discontinue azithromycin and trimethoprim  sulfamethoxazole 4. Moisturizer for his dry skin 5. Encouraged to quit smoking cigarettes 6. He should be seen in the dental clinic within the next month 7. Return to clinic after lab work in 6 months   Cliffton [name], MD [facility] [phone] pager   [phone] cell 10/04/2012, 2:25 PM

## 2012-10-12 ENCOUNTER — Telehealth: Payer: Self-pay

## 2012-10-12 NOTE — Telephone Encounter (Signed)
Pharmacy calling to verify regimen change.   Information given. Regimen changed and new regimen was given.  Reyataz changed to Prezista , all others the same .     [name],

## 2012-10-18 ENCOUNTER — Telehealth: Payer: Self-pay | Admitting: *Deleted

## 2012-10-18 ENCOUNTER — Other Ambulatory Visit: Payer: Self-pay | Admitting: Internal Medicine

## 2012-10-18 DIAGNOSIS — B20 Human immunodeficiency virus [HIV] disease: Secondary | ICD-10-CM

## 2012-10-18 MED ORDER — TENOFOVIR DISOPROXIL FUMARATE 300 MG PO TABS: 300.0000 mg | ORAL_TABLET | ORAL | Status: DC

## 2012-10-18 NOTE — Telephone Encounter (Signed)
Patient called needing clarification on dosing instructions for his Viread. Patient reported taking it once a week, but noticed that the instructions read twice a week on his latest refill bottle.  Please advise.  Andree Coss, RN

## 2012-10-18 NOTE — Telephone Encounter (Signed)
He should be taking Viread once weekly.

## 2012-10-19 ENCOUNTER — Telehealth: Payer: Self-pay | Admitting: *Deleted

## 2012-10-19 NOTE — Telephone Encounter (Signed)
Left message on patient's voice mail advising him that he should be taking the Viread once weekly and asking him to call us back if he has any questions.  Medication has been changed in his list by Dr. Orvan Falconer. Andree Coss, RN

## 2012-12-08 ENCOUNTER — Other Ambulatory Visit (HOSPITAL_COMMUNITY): Payer: Self-pay | Admitting: Nephrology

## 2012-12-13 ENCOUNTER — Encounter (HOSPITAL_COMMUNITY): Payer: Self-pay

## 2012-12-13 ENCOUNTER — Ambulatory Visit (HOSPITAL_COMMUNITY)
Admission: RE | Admit: 2012-12-13 | Discharge: 2012-12-13 | Disposition: A | Discharge status: Home / Self Care | Payer: Medicare Other | Source: Ambulatory Visit | Attending: Nephrology | Admitting: Nephrology

## 2012-12-13 ENCOUNTER — Other Ambulatory Visit (HOSPITAL_COMMUNITY): Payer: Self-pay | Admitting: Nephrology

## 2012-12-13 DIAGNOSIS — N186 End stage renal disease: Secondary | ICD-10-CM | POA: Insufficient documentation

## 2012-12-13 DIAGNOSIS — Y832 Surgical operation with anastomosis, bypass or graft as the cause of abnormal reaction of the patient, or of later complication, without mention of misadventure at the time of the procedure: Secondary | ICD-10-CM | POA: Insufficient documentation

## 2012-12-13 DIAGNOSIS — I871 Compression of vein: Secondary | ICD-10-CM | POA: Insufficient documentation

## 2012-12-13 DIAGNOSIS — M1710 Unilateral primary osteoarthritis, unspecified knee: Secondary | ICD-10-CM | POA: Insufficient documentation

## 2012-12-13 DIAGNOSIS — I120 Hypertensive chronic kidney disease with stage 5 chronic kidney disease or end stage renal disease: Secondary | ICD-10-CM | POA: Insufficient documentation

## 2012-12-13 DIAGNOSIS — Z21 Asymptomatic human immunodeficiency virus [HIV] infection status: Secondary | ICD-10-CM | POA: Insufficient documentation

## 2012-12-13 DIAGNOSIS — T82898A Other specified complication of vascular prosthetic devices, implants and grafts, initial encounter: Secondary | ICD-10-CM | POA: Insufficient documentation

## 2012-12-13 MED ORDER — FENTANYL CITRATE 0.05 MG/ML IJ SOLN
INTRAMUSCULAR | Status: AC
  Filled 2012-12-13: qty 4

## 2012-12-13 MED ORDER — FENTANYL CITRATE 0.05 MG/ML IJ SOLN
INTRAMUSCULAR | Status: DC | PRN
  Administered 2012-12-13: 50 ug via INTRAVENOUS

## 2012-12-13 MED ORDER — MIDAZOLAM HCL 2 MG/2ML IJ SOLN
INTRAMUSCULAR | Status: AC
  Filled 2012-12-13: qty 4

## 2012-12-13 MED ORDER — FENTANYL CITRATE 0.05 MG/ML IJ SOLN
INTRAMUSCULAR | Status: AC
  Filled 2012-12-13: qty 2

## 2012-12-13 MED ORDER — IOHEXOL 300 MG/ML  SOLN
100.0000 mL | Freq: Once | INTRAMUSCULAR | Status: AC | PRN
  Administered 2012-12-13: 75 mL via INTRAVENOUS

## 2012-12-13 NOTE — Procedures (Signed)
Fistulogram demonstrated a critical stenosis in the right upper arm cephalic vein.   The stenosis was dilated with 6 mm and 7 mm balloons.  Improved flow following balloon dilatation.  No immediate complication.

## 2012-12-13 NOTE — H&P (Signed)
[name] is an 47 y.o. male.   Chief Complaint: right arm dialysis fistula stenosis per fistulogram Scheduled now for angioplasty/stent placement Poss dialysis catheter placement if needed HPI: HIV; ESRD; HSV; thyroid dz; HTN  Past Medical History  Diagnosis Date  . HIV infection   . Chronic kidney disease   . DJD (degenerative joint disease)     right knee  . Tertiary syphilis   . Genital herpes   . Anal condyloma   . Thyroid disease   . Hypertension   . Family history of disseminated HSV infection   . Renal disorder   . Abdominal abscess     Past Surgical History  Procedure Laterality Date  . Av fistula placement  06/16/11    Right brachiocephalic AVF  . Appendectomy    . Abscess drainage  abdominal abscess    No family history on file. Social History:  reports that he has been smoking Cigarettes.  He has a 7.5 pack-year smoking history. He has never used smokeless tobacco. He reports that he does not drink alcohol or use illicit drugs.  Allergies:  Allergies  Allergen Reactions  . Acetaminophen-Codeine Nausea And Vomiting  . Oxycodone-Acetaminophen Nausea And Vomiting  . Penicillins Hives     No results found for this or any previous visit (from the past 48 hour(s)). No results found.  Review of Systems  Constitutional: Negative for fever.  Respiratory: Negative for shortness of breath.   Cardiovascular: Negative for chest pain.  Gastrointestinal: Negative for abdominal pain.  Neurological: Negative for weakness.    There were no vitals taken for this visit. Physical Exam  Constitutional: He is oriented to person, place, and time. He appears well-developed and well-nourished.  Cardiovascular: Normal rate and regular rhythm.   No murmur heard. Respiratory: Effort normal and breath sounds normal. He has no wheezes.  GI: Soft. Bowel sounds are normal. There is no tenderness.  Musculoskeletal: Normal range of motion.  Neurological: He is alert  and oriented to person, place, and time.  Psychiatric: He has a normal mood and affect. His behavior is normal. Judgment and thought content normal.     Assessment/Plan Right arm dialysis fistula stenosis Scheduled for pta/stent  Poss dialysis catheter if needed Pt aware of procedure benefits and risks and agreeable to proceed Consent signed and in chart  TURPIN,PAMELA A 12/13/2012, 1:53 PM

## 2013-01-20 ENCOUNTER — Encounter (HOSPITAL_COMMUNITY): Payer: Self-pay | Admitting: Adult Health

## 2013-01-20 ENCOUNTER — Emergency Department (HOSPITAL_COMMUNITY): Payer: Medicare Other

## 2013-01-20 ENCOUNTER — Emergency Department (HOSPITAL_COMMUNITY)
Admission: EM | Admit: 2013-01-20 | Discharge: 2013-01-20 | Disposition: A | Discharge status: Home / Self Care | Payer: Medicare Other | Attending: Emergency Medicine | Admitting: Emergency Medicine

## 2013-01-20 DIAGNOSIS — J302 Other seasonal allergic rhinitis: Secondary | ICD-10-CM

## 2013-01-20 DIAGNOSIS — A6000 Herpesviral infection of urogenital system, unspecified: Secondary | ICD-10-CM | POA: Insufficient documentation

## 2013-01-20 DIAGNOSIS — F17200 Nicotine dependence, unspecified, uncomplicated: Secondary | ICD-10-CM | POA: Insufficient documentation

## 2013-01-20 DIAGNOSIS — R509 Fever, unspecified: Secondary | ICD-10-CM | POA: Insufficient documentation

## 2013-01-20 DIAGNOSIS — I120 Hypertensive chronic kidney disease with stage 5 chronic kidney disease or end stage renal disease: Secondary | ICD-10-CM | POA: Insufficient documentation

## 2013-01-20 DIAGNOSIS — R059 Cough, unspecified: Secondary | ICD-10-CM | POA: Insufficient documentation

## 2013-01-20 DIAGNOSIS — R05 Cough: Secondary | ICD-10-CM | POA: Insufficient documentation

## 2013-01-20 DIAGNOSIS — R6889 Other general symptoms and signs: Secondary | ICD-10-CM | POA: Insufficient documentation

## 2013-01-20 DIAGNOSIS — J3489 Other specified disorders of nose and nasal sinuses: Secondary | ICD-10-CM | POA: Insufficient documentation

## 2013-01-20 DIAGNOSIS — N186 End stage renal disease: Secondary | ICD-10-CM | POA: Insufficient documentation

## 2013-01-20 DIAGNOSIS — Z21 Asymptomatic human immunodeficiency virus [HIV] infection status: Secondary | ICD-10-CM | POA: Insufficient documentation

## 2013-01-20 DIAGNOSIS — Z872 Personal history of diseases of the skin and subcutaneous tissue: Secondary | ICD-10-CM | POA: Insufficient documentation

## 2013-01-20 DIAGNOSIS — Z880 Allergy status to penicillin: Secondary | ICD-10-CM | POA: Insufficient documentation

## 2013-01-20 DIAGNOSIS — Z8739 Personal history of other diseases of the musculoskeletal system and connective tissue: Secondary | ICD-10-CM | POA: Insufficient documentation

## 2013-01-20 DIAGNOSIS — R112 Nausea with vomiting, unspecified: Secondary | ICD-10-CM | POA: Insufficient documentation

## 2013-01-20 DIAGNOSIS — Z8619 Personal history of other infectious and parasitic diseases: Secondary | ICD-10-CM | POA: Insufficient documentation

## 2013-01-20 DIAGNOSIS — H579 Unspecified disorder of eye and adnexa: Secondary | ICD-10-CM | POA: Insufficient documentation

## 2013-01-20 DIAGNOSIS — J40 Bronchitis, not specified as acute or chronic: Secondary | ICD-10-CM

## 2013-01-20 DIAGNOSIS — J309 Allergic rhinitis, unspecified: Secondary | ICD-10-CM | POA: Insufficient documentation

## 2013-01-20 DIAGNOSIS — Z862 Personal history of diseases of the blood and blood-forming organs and certain disorders involving the immune mechanism: Secondary | ICD-10-CM | POA: Insufficient documentation

## 2013-01-20 DIAGNOSIS — R5383 Other fatigue: Secondary | ICD-10-CM | POA: Insufficient documentation

## 2013-01-20 DIAGNOSIS — R5381 Other malaise: Secondary | ICD-10-CM | POA: Insufficient documentation

## 2013-01-20 DIAGNOSIS — Z79899 Other long term (current) drug therapy: Secondary | ICD-10-CM | POA: Insufficient documentation

## 2013-01-20 DIAGNOSIS — Z992 Dependence on renal dialysis: Secondary | ICD-10-CM | POA: Insufficient documentation

## 2013-01-20 DIAGNOSIS — Z8639 Personal history of other endocrine, nutritional and metabolic disease: Secondary | ICD-10-CM | POA: Insufficient documentation

## 2013-01-20 DIAGNOSIS — J209 Acute bronchitis, unspecified: Secondary | ICD-10-CM | POA: Insufficient documentation

## 2013-01-20 LAB — CBC WITH DIFFERENTIAL/PLATELET
Basophils Relative: 0 % (ref 0–1)
Eosinophils Absolute: 0.2 10*3/uL (ref 0.0–0.7)
Eosinophils Relative: 3 % (ref 0–5)
MCH: 37 pg — ABNORMAL HIGH (ref 26.0–34.0)
MCHC: 35.9 g/dL (ref 30.0–36.0)
Neutrophils Relative %: 69 % (ref 43–77)
Platelets: 82 10*3/uL — ABNORMAL LOW (ref 150–400)
RBC: 4.22 MIL/uL (ref 4.22–5.81)
RDW: 14.2 % (ref 11.5–15.5)

## 2013-01-20 LAB — BASIC METABOLIC PANEL
Calcium: 7.4 mg/dL — ABNORMAL LOW (ref 8.4–10.5)
GFR calc Af Amer: 5 mL/min — ABNORMAL LOW (ref >90)
GFR calc non Af Amer: 5 mL/min — ABNORMAL LOW (ref >90)
Potassium: 5.2 mEq/L — ABNORMAL HIGH (ref 3.5–5.1)
Sodium: 132 mEq/L — ABNORMAL LOW (ref 135–145)

## 2013-01-20 MED ORDER — HYDROCODONE-ACETAMINOPHEN 5-325 MG PO TABS: 2.0000 | ORAL_TABLET | ORAL | Status: DC | PRN

## 2013-01-20 MED ORDER — LORATADINE 10 MG PO TABS: 10.0000 mg | ORAL_TABLET | Freq: Every day | ORAL | Status: DC

## 2013-01-20 MED ORDER — AZITHROMYCIN 250 MG PO TABS
500.0000 mg | ORAL_TABLET | Freq: Once | ORAL | Status: AC
  Administered 2013-01-20: 500 mg via ORAL
  Filled 2013-01-20: qty 2

## 2013-01-20 MED ORDER — LORATADINE 10 MG PO TABS
10.0000 mg | ORAL_TABLET | Freq: Every day | ORAL | Status: DC
  Administered 2013-01-20: 10 mg via ORAL
  Filled 2013-01-20: qty 1

## 2013-01-20 MED ORDER — FLUTICASONE PROPIONATE 50 MCG/ACT NA SUSP: 2.0000 | Freq: Every day | NASAL | Status: DC

## 2013-01-20 MED ORDER — FLUTICASONE PROPIONATE 50 MCG/ACT NA SUSP
2.0000 | Freq: Every day | NASAL | Status: DC
  Administered 2013-01-20: 2 via NASAL
  Filled 2013-01-20: qty 16

## 2013-01-20 MED ORDER — AZITHROMYCIN 250 MG PO TABS: ORAL_TABLET | ORAL | Status: DC

## 2013-01-20 MED ORDER — HYDROCODONE-ACETAMINOPHEN 5-325 MG PO TABS
2.0000 | ORAL_TABLET | Freq: Once | ORAL | Status: AC
  Administered 2013-01-20: 2 via ORAL
  Filled 2013-01-20: qty 2

## 2013-01-20 NOTE — ED Provider Notes (Signed)
History     CSN: [id number]  Arrival date & time 01/20/13  0206   First MD Initiated Contact with Patient 01/20/13 [id number]      Chief Complaint  Patient presents with  . Generalized Body Aches    (Consider location/radiation/quality/duration/timing/severity/associated sxs/prior treatment) HPI 47 yo male presents to the ER from home with complaint of one day of generalized weakness, myalgias, cough productive of white sputum, runny nose and itchy eyes, sneezing.  He has had nausea and post tussive emesis.  No fevers, but has had chills. PMH of HIV, ESRD on dialysis T TH Sat, due later today.  Past Medical History  Diagnosis Date  . HIV infection   . Chronic kidney disease   . DJD (degenerative joint disease)     right knee  . Tertiary syphilis   . Genital herpes   . Anal condyloma   . Thyroid disease   . Hypertension   . Family history of disseminated HSV infection   . Renal disorder   . Abdominal abscess     Past Surgical History  Procedure Laterality Date  . Av fistula placement  06/16/11    Right brachiocephalic AVF  . Appendectomy    . Abscess drainage  abdominal abscess    History reviewed. No pertinent family history.  History  Substance Use Topics  . Smoking status: Current Every Day Smoker -- 0.50 packs/day for 15 years    Types: Cigarettes  . Smokeless tobacco: Never Used  . Alcohol Use: No      Review of Systems  All other systems reviewed and are negative.    Allergies  Acetaminophen-codeine; Oxycodone-acetaminophen; and Penicillins  Home Medications   Current Outpatient Rx  Name  Route  Sig  Dispense  Refill  . Acetaminophen (TYLENOL ARTHRITIS PAIN PO)   Oral   Take 1-2 tablets by mouth every 8 (eight) hours as needed (headache or pain).          Marland Kitchen atazanavir (REYATAZ) 200 MG capsule   Oral   Take 200 mg by mouth daily with breakfast.         . calcium carbonate 200 MG capsule   Oral   Take 250 mg by mouth 2 (two) times daily  with a meal.         . Darunavir Ethanolate (PREZISTA) 800 MG tablet   Oral   Take 1 tablet (800 mg total) by mouth daily with breakfast.   30 tablet   11   . ibuprofen (ADVIL,MOTRIN) 200 MG tablet   Oral   Take 400 mg by mouth every 6 (six) hours as needed for pain.          . ritonavir (NORVIR) 100 MG capsule   Oral   Take 1 capsule (100 mg total) by mouth daily.   30 capsule   6   . tenofovir (VIREAD) 300 MG tablet   Oral   Take 1 tablet (300 mg total) by mouth once a week. Tuesday and thursday   4 tablet   11   . valACYclovir (VALTREX) 500 MG tablet   Oral   Take 1 tablet (500 mg total) by mouth daily.   30 tablet   6   . azithromycin (ZITHROMAX) 250 MG tablet      Take one tab daily starting tomorrow   4 each   0   . fluticasone (FLONASE) 50 MCG/ACT nasal spray   Nasal   Place 2 sprays into the nose daily.  16 g   0   . HYDROcodone-acetaminophen (NORCO/VICODIN) 5-325 MG per tablet   Oral   Take 2 tablets by mouth every 4 (four) hours as needed for pain.   15 tablet   0   . loratadine (CLARITIN) 10 MG tablet   Oral   Take 1 tablet (10 mg total) by mouth daily.   30 tablet   0     BP 122/49  Pulse 51  Temp(Src) 98.6 F (37 C) (Oral)  Resp 16  SpO2 99%  Physical Exam  Nursing note and vitals reviewed. Constitutional: He is oriented to person, place, and time. He appears well-developed and well-nourished.  HENT:  Head: Normocephalic and atraumatic.  Right Ear: External ear normal.  Left Ear: External ear normal.  Nose: Nose normal.  Mouth/Throat: Oropharynx is clear and moist.  Rhinorrhea boggy turbinates  Eyes: Conjunctivae and EOM are normal. Pupils are equal, round, and reactive to light.  Neck: Normal range of motion. Neck supple. No JVD present. No tracheal deviation present. No thyromegaly present.  Cardiovascular: Normal rate, regular rhythm and intact distal pulses.  Exam reveals no gallop and no friction rub.   Murmur  heard. Pulmonary/Chest: Effort normal and breath sounds normal. No stridor. No respiratory distress. He has no wheezes. He has no rales. He exhibits no tenderness.  Abdominal: Soft. Bowel sounds are normal. He exhibits no distension and no mass. There is no tenderness. There is no rebound and no guarding.  Musculoskeletal: Normal range of motion. He exhibits tenderness (mild diffuse tenderness). He exhibits no edema.  Lymphadenopathy:    He has no cervical adenopathy.  Neurological: He is alert and oriented to person, place, and time. He exhibits normal muscle tone. Coordination normal.  Skin: Skin is warm and dry. No rash noted. No erythema. No pallor.  Psychiatric: He has a normal mood and affect. His behavior is normal. Judgment and thought content normal.    ED Course  Procedures (including critical care time)  Labs Reviewed  CBC WITH DIFFERENTIAL - Abnormal; Notable for the following:    MCV 103.1 (*)    MCH 37.0 (*)    Platelets 82 (*)    All other components within normal limits  BASIC METABOLIC PANEL - Abnormal; Notable for the following:    Sodium 132 (*)    Potassium 5.2 (*)    Chloride 88 (*)    BUN 36 (*)    Creatinine, Ser 11.82 (*)    Calcium 7.4 (*)    GFR calc non Af Amer 5 (*)    GFR calc Af Amer 5 (*)    All other components within normal limits   Dg Chest 2 View  01/20/2013  *RADIOLOGY REPORT*  Clinical Data: Body aches, fever  CHEST - 2 VIEW  Comparison: 02/10/2012  Findings: Left subclavian stent.  Central bronchitic changes. Lungs are otherwise predominately clear. No pleural effusion or pneumothorax. The cardiomediastinal contours are within normal limits. Chronic right pericardial calcifications.  The visualized bones and soft tissues are without significant appreciable abnormality.  IMPRESSION: Chronic bronchitic change and right pericardial calcification.  No acute consolidation.   Original Report Authenticated By: Jearld Lesch, M.D.      1.  Bronchitis   2. Seasonal allergies   3. Fatigue       MDM  47 year old male history of end-stage renal disease due for dialysis later today, and HIV disease, who presents with one to 2 days of allergic type symptoms and worsening cough.  Subjective fevers and chills.  Chest x-ray with mild bronchitis.  Given his history of HIV disease, will cover with Zithromax.  Patient given precautions for close followup and return to the emergency department        Olivia Mackie, MD 01/20/13 [phone]

## 2013-01-20 NOTE — ED Notes (Signed)
Presents with one day of generalized body aches, vomiting, nausea, weakness, cough, runny nose, chillls and headache. Pt is dialysis pt days T, Thu, Sat.   Cough is productive with white sputum.

## 2013-02-26 ENCOUNTER — Other Ambulatory Visit: Payer: Self-pay | Admitting: Nephrology

## 2013-02-26 ENCOUNTER — Other Ambulatory Visit (HOSPITAL_COMMUNITY): Payer: Self-pay | Admitting: Nephrology

## 2013-02-26 DIAGNOSIS — T82898A Other specified complication of vascular prosthetic devices, implants and grafts, initial encounter: Secondary | ICD-10-CM

## 2013-02-26 DIAGNOSIS — N186 End stage renal disease: Secondary | ICD-10-CM

## 2013-02-26 MED ORDER — DIPHENHYDRAMINE HCL 25 MG PO CAPS: ORAL_CAPSULE | ORAL | Status: DC

## 2013-02-27 ENCOUNTER — Other Ambulatory Visit (HOSPITAL_COMMUNITY): Payer: Self-pay | Admitting: Nephrology

## 2013-02-27 ENCOUNTER — Ambulatory Visit (HOSPITAL_COMMUNITY)
Admission: RE | Admit: 2013-02-27 | Discharge: 2013-02-27 | Disposition: A | Discharge status: Home / Self Care | Payer: Medicare Other | Source: Ambulatory Visit | Attending: Nephrology | Admitting: Nephrology

## 2013-02-27 ENCOUNTER — Encounter (HOSPITAL_COMMUNITY): Payer: Self-pay

## 2013-02-27 ENCOUNTER — Other Ambulatory Visit: Payer: Self-pay | Admitting: Nephrology

## 2013-02-27 DIAGNOSIS — B20 Human immunodeficiency virus [HIV] disease: Secondary | ICD-10-CM | POA: Insufficient documentation

## 2013-02-27 DIAGNOSIS — F17200 Nicotine dependence, unspecified, uncomplicated: Secondary | ICD-10-CM | POA: Insufficient documentation

## 2013-02-27 DIAGNOSIS — Y832 Surgical operation with anastomosis, bypass or graft as the cause of abnormal reaction of the patient, or of later complication, without mention of misadventure at the time of the procedure: Secondary | ICD-10-CM | POA: Insufficient documentation

## 2013-02-27 DIAGNOSIS — I868 Varicose veins of other specified sites: Secondary | ICD-10-CM | POA: Insufficient documentation

## 2013-02-27 DIAGNOSIS — Z91041 Radiographic dye allergy status: Secondary | ICD-10-CM | POA: Insufficient documentation

## 2013-02-27 DIAGNOSIS — N186 End stage renal disease: Secondary | ICD-10-CM

## 2013-02-27 DIAGNOSIS — Z79899 Other long term (current) drug therapy: Secondary | ICD-10-CM | POA: Insufficient documentation

## 2013-02-27 DIAGNOSIS — A630 Anogenital (venereal) warts: Secondary | ICD-10-CM | POA: Insufficient documentation

## 2013-02-27 DIAGNOSIS — Z992 Dependence on renal dialysis: Secondary | ICD-10-CM

## 2013-02-27 DIAGNOSIS — Z880 Allergy status to penicillin: Secondary | ICD-10-CM | POA: Insufficient documentation

## 2013-02-27 DIAGNOSIS — I771 Stricture of artery: Secondary | ICD-10-CM | POA: Insufficient documentation

## 2013-02-27 DIAGNOSIS — T82898A Other specified complication of vascular prosthetic devices, implants and grafts, initial encounter: Secondary | ICD-10-CM | POA: Insufficient documentation

## 2013-02-27 DIAGNOSIS — I871 Compression of vein: Secondary | ICD-10-CM | POA: Insufficient documentation

## 2013-02-27 DIAGNOSIS — Z885 Allergy status to narcotic agent status: Secondary | ICD-10-CM | POA: Insufficient documentation

## 2013-02-27 DIAGNOSIS — M1710 Unilateral primary osteoarthritis, unspecified knee: Secondary | ICD-10-CM | POA: Insufficient documentation

## 2013-02-27 DIAGNOSIS — A6000 Herpesviral infection of urogenital system, unspecified: Secondary | ICD-10-CM | POA: Insufficient documentation

## 2013-02-27 DIAGNOSIS — I82609 Acute embolism and thrombosis of unspecified veins of unspecified upper extremity: Secondary | ICD-10-CM | POA: Insufficient documentation

## 2013-02-27 DIAGNOSIS — I120 Hypertensive chronic kidney disease with stage 5 chronic kidney disease or end stage renal disease: Secondary | ICD-10-CM | POA: Insufficient documentation

## 2013-02-27 MED ORDER — PENTAFLUOROPROP-TETRAFLUOROETH EX AERO: 1.0000 "application " | INHALATION_SPRAY | CUTANEOUS | Status: DC | PRN

## 2013-02-27 MED ORDER — SODIUM CHLORIDE 0.9 % IV SOLN: 100.0000 mL | INTRAVENOUS | Status: DC | PRN

## 2013-02-27 MED ORDER — HEPARIN SODIUM (PORCINE) 1000 UNIT/ML IJ SOLN
INTRAMUSCULAR | Status: AC
  Filled 2013-02-27: qty 1

## 2013-02-27 MED ORDER — FENTANYL CITRATE 0.05 MG/ML IJ SOLN
INTRAMUSCULAR | Status: AC | PRN
  Administered 2013-02-27 (×3): 50 ug via INTRAVENOUS

## 2013-02-27 MED ORDER — NEPRO/CARBSTEADY PO LIQD: 237.0000 mL | ORAL | Status: DC | PRN

## 2013-02-27 MED ORDER — IOHEXOL 300 MG/ML  SOLN
100.0000 mL | Freq: Once | INTRAMUSCULAR | Status: AC | PRN
  Administered 2013-02-27: 60 mL via INTRAVENOUS

## 2013-02-27 MED ORDER — ALTEPLASE 2 MG IJ SOLR: 2.0000 mg | Freq: Once | INTRAMUSCULAR | Status: AC | PRN

## 2013-02-27 MED ORDER — MIDAZOLAM HCL 2 MG/2ML IJ SOLN
INTRAMUSCULAR | Status: AC
  Filled 2013-02-27: qty 6

## 2013-02-27 MED ORDER — HEPARIN SODIUM (PORCINE) 1000 UNIT/ML IJ SOLN
3000.0000 [IU] | Freq: Once | INTRAMUSCULAR | Status: AC
  Administered 2013-02-27: 3000 [IU] via INTRAVENOUS

## 2013-02-27 MED ORDER — HEPARIN SODIUM (PORCINE) 1000 UNIT/ML IJ SOLN
2000.0000 [IU] | Freq: Once | INTRAMUSCULAR | Status: AC
  Administered 2013-02-27: 2000 [IU] via INTRAVENOUS

## 2013-02-27 MED ORDER — FENTANYL CITRATE 0.05 MG/ML IJ SOLN
INTRAMUSCULAR | Status: AC
  Filled 2013-02-27: qty 4

## 2013-02-27 MED ORDER — LIDOCAINE-PRILOCAINE 2.5-2.5 % EX CREA: 1.0000 "application " | TOPICAL_CREAM | CUTANEOUS | Status: DC | PRN

## 2013-02-27 MED ORDER — ALTEPLASE 100 MG IV SOLR
2.0000 mg | Freq: Once | INTRAVENOUS | Status: AC
  Administered 2013-02-27: 2 mg
  Filled 2013-02-27: qty 2

## 2013-02-27 MED ORDER — MIDAZOLAM HCL 2 MG/2ML IJ SOLN
INTRAMUSCULAR | Status: AC | PRN
  Administered 2013-02-27: 1 mg via INTRAVENOUS
  Administered 2013-02-27: 2 mg via INTRAVENOUS
  Administered 2013-02-27: 1 mg via INTRAVENOUS

## 2013-02-27 MED ORDER — DOXERCALCIFEROL 4 MCG/2ML IV SOLN
3.0000 ug | Freq: Once | INTRAVENOUS | Status: AC
  Administered 2013-02-27: 3 ug via INTRAVENOUS

## 2013-02-27 MED ORDER — LIDOCAINE HCL (PF) 1 % IJ SOLN: 5.0000 mL | INTRAMUSCULAR | Status: DC | PRN

## 2013-02-27 NOTE — Progress Notes (Signed)
CRITICAL VALUE ALERT  Critical value received:  K.7.5  Date of notification:  02/27/13  Time of notification:  1012  Critical value read back:yes  Nurse who received alert:  Cay Schillings, RN  MD notified (1st page):  Claud Kelp, PA on unit made aware  Time of first page:  Verbal notification on the unit  MD notified (2nd page):  Time of second page:  Responding [id number] Broadus John, [place] notified on the unit  Time MD responded:

## 2013-02-27 NOTE — Progress Notes (Signed)
Pt discharged home from HD; he was accompanied off the unit by Erlinda Hong to his wife waiting in the hallway. Pt denies pain, dizziness and shortness of breath. Had problems running the fistula; could not go over BFR of 275 min/hr  due to increased venous pressures. Able to complete tx.

## 2013-02-27 NOTE — ED Notes (Signed)
Maud Deed Renal PA aware of Potassium result of 7.5.

## 2013-02-27 NOTE — Procedures (Signed)
Declot R UA fistula Venous and arterial anast PTA No complication No blood loss. See complete dictation in [facility]

## 2013-02-27 NOTE — H&P (Signed)
Chief Complaint: clotted Rt AVF HPI: [name] is an 47 y.o. male with ESRD. He has a right UE AVF that has had prior PTA due to stenosis, but at his last HD session, they pulled some clots. He has been given pre-meds for contrast allergy and referred to IR for declot procedure. Last HD 5/27, pt feeling ok but checking K+ PMHx and meds reviewed.  Past Medical History:  Past Medical History  Diagnosis Date  . HIV infection   . Chronic kidney disease   . DJD (degenerative joint disease)     right knee  . Tertiary syphilis   . Genital herpes   . Anal condyloma   . Thyroid disease   . Hypertension   . Family history of disseminated HSV infection   . Renal disorder   . Abdominal abscess     Past Surgical History:  Past Surgical History  Procedure Laterality Date  . Av fistula placement  06/16/11    Right brachiocephalic AVF  . Appendectomy    . Abscess drainage  abdominal abscess    Family History: No family history on file.  Social History:  reports that he has been smoking Cigarettes.  He has a 7.5 pack-year smoking history. He has never used smokeless tobacco. He reports that he does not drink alcohol or use illicit drugs.  Allergies:  Allergies  Allergen Reactions  . Contrast Media (Iodinated Diagnostic Agents) Hives and Itching  . Acetaminophen-Codeine Nausea And Vomiting  . Oxycodone-Acetaminophen Nausea And Vomiting  . Penicillins Hives    Medications: Patient's Medications   New Prescriptions    DARUNAVIR ETHANOLATE (PREZISTA) 800 MG TABLET  Take 1 tablet (800 mg total) by mouth daily with breakfast.   Previous Medications    ACETAMINOPHEN (TYLENOL ARTHRITIS PAIN PO)  Take by mouth.    CALCIUM CARBONATE 200 MG CAPSULE  Take 250 mg by mouth 2 (two) times daily with a meal.    HYDROXYZINE (ATARAX/VISTARIL) 25 MG TABLET  Take 25 mg by mouth 3 (three) times daily as needed.    IBUPROFEN (ADVIL,MOTRIN) 200 MG TABLET  Take 400 mg by mouth every 6 (six)  hours as needed.    LAMIVUDINE (EPIVIR) 10 MG/ML SOLUTION  Take 5 mLs (50 mg total) by mouth at bedtime.    RITONAVIR (NORVIR) 100 MG CAPSULE  Take 1 capsule (100 mg total) by mouth daily.    TENOFOVIR (VIREAD) 300 MG TABLET  Take 1 tablet (300 mg total) by mouth 2 (two) times a week. Tuesday and thursday    VALACYCLOVIR (VALTREX) 500 MG TABLET  Take 1 tablet (500 mg total) by mouth daily.     Please HPI for pertinent positives, otherwise complete 10 system ROS negative.  Physical Exam: BP 160/81  Pulse 56  Resp 16  SpO2 100% There is no weight on file to calculate BMI.   General Appearance:  Alert, cooperative, no distress, appears stated age  Head:  Normocephalic, without obvious abnormality, atraumatic  ENT: Unremarkable  Lungs:   Clear to auscultation bilaterally, no w/r/r, respirations unlabored without use of accessory muscles.  Chest Wall:  No tenderness or deformity  Heart:  Regular rate and rhythm, S1, S2 normal, no murmur, rub or gallop.  Extremities: Rt UE AVF with palpable pulse until proximal aspect, then no pulse or thrill.  Pulses: 2+ and symmetric  Neurologic: Normal affect, no gross deficits.   No results found for this or any previous visit (from the past 48 hour(s)). No results  found.  Assessment/Plan Clotted right AVF Discussed thrombolysis/thrombectomy of AV Graft/Fistula, possible angioplasty, possible stent, possible HD catheter placement if necessary. Risks, benefits, use of sedation thoroughly explained.  Brayton El PA-C 02/27/2013, 8:38 AM

## 2013-03-02 ENCOUNTER — Other Ambulatory Visit (HOSPITAL_COMMUNITY): Payer: Self-pay | Admitting: Nephrology

## 2013-03-02 DIAGNOSIS — N186 End stage renal disease: Secondary | ICD-10-CM

## 2013-03-03 ENCOUNTER — Non-Acute Institutional Stay (HOSPITAL_COMMUNITY): Payer: Medicare Other

## 2013-03-03 ENCOUNTER — Other Ambulatory Visit (HOSPITAL_COMMUNITY): Payer: Self-pay | Admitting: Nephrology

## 2013-03-03 ENCOUNTER — Other Ambulatory Visit: Payer: Self-pay | Admitting: Radiology

## 2013-03-03 ENCOUNTER — Encounter (HOSPITAL_COMMUNITY): Payer: Self-pay

## 2013-03-03 ENCOUNTER — Ambulatory Visit (HOSPITAL_COMMUNITY)
Admission: RE | Admit: 2013-03-03 | Discharge: 2013-03-03 | Disposition: A | Discharge status: Home / Self Care | Payer: Medicare Other | Source: Ambulatory Visit | Attending: Nephrology | Admitting: Nephrology

## 2013-03-03 ENCOUNTER — Other Ambulatory Visit (HOSPITAL_COMMUNITY): Payer: Self-pay | Admitting: Medical

## 2013-03-03 ENCOUNTER — Inpatient Hospital Stay (HOSPITAL_COMMUNITY)
Admission: AD | Admit: 2013-03-03 | Discharge: 2013-03-04 | DRG: 640 | Disposition: A | Discharge status: Home / Self Care | Payer: Medicare Other | Source: Ambulatory Visit | Attending: Nephrology | Admitting: Nephrology

## 2013-03-03 DIAGNOSIS — E875 Hyperkalemia: Principal | ICD-10-CM | POA: Diagnosis present

## 2013-03-03 DIAGNOSIS — B20 Human immunodeficiency virus [HIV] disease: Secondary | ICD-10-CM | POA: Diagnosis present

## 2013-03-03 DIAGNOSIS — I120 Hypertensive chronic kidney disease with stage 5 chronic kidney disease or end stage renal disease: Secondary | ICD-10-CM | POA: Diagnosis present

## 2013-03-03 DIAGNOSIS — N186 End stage renal disease: Secondary | ICD-10-CM | POA: Diagnosis present

## 2013-03-03 DIAGNOSIS — Z992 Dependence on renal dialysis: Secondary | ICD-10-CM

## 2013-03-03 DIAGNOSIS — M1710 Unilateral primary osteoarthritis, unspecified knee: Secondary | ICD-10-CM | POA: Diagnosis present

## 2013-03-03 DIAGNOSIS — F17200 Nicotine dependence, unspecified, uncomplicated: Secondary | ICD-10-CM | POA: Diagnosis present

## 2013-03-03 DIAGNOSIS — E079 Disorder of thyroid, unspecified: Secondary | ICD-10-CM | POA: Diagnosis present

## 2013-03-03 DIAGNOSIS — T82898A Other specified complication of vascular prosthetic devices, implants and grafts, initial encounter: Secondary | ICD-10-CM | POA: Diagnosis present

## 2013-03-03 DIAGNOSIS — Y841 Kidney dialysis as the cause of abnormal reaction of the patient, or of later complication, without mention of misadventure at the time of the procedure: Secondary | ICD-10-CM | POA: Diagnosis present

## 2013-03-03 DIAGNOSIS — Z79899 Other long term (current) drug therapy: Secondary | ICD-10-CM

## 2013-03-03 HISTORY — DX: End stage renal disease: N18.6

## 2013-03-03 HISTORY — DX: Thrombosis due to vascular prosthetic devices, implants and grafts, initial encounter: T82.868A

## 2013-03-03 HISTORY — DX: Dependence on renal dialysis: Z99.2

## 2013-03-03 LAB — PROTIME-INR: Prothrombin Time: 13 seconds (ref 11.6–15.2)

## 2013-03-03 LAB — BASIC METABOLIC PANEL
BUN: 97 mg/dL — ABNORMAL HIGH (ref 6–23)
Calcium: 8.5 mg/dL (ref 8.4–10.5)
GFR calc non Af Amer: 2 mL/min — ABNORMAL LOW (ref >90)
Glucose, Bld: 103 mg/dL — ABNORMAL HIGH (ref 70–99)

## 2013-03-03 LAB — CBC
Hemoglobin: 13.5 g/dL (ref 13.0–17.0)
MCH: 35 pg — ABNORMAL HIGH (ref 26.0–34.0)
MCHC: 35.3 g/dL (ref 30.0–36.0)

## 2013-03-03 LAB — POTASSIUM
Potassium: 7.4 mEq/L (ref 3.5–5.1)
Potassium: >7.5 mEq/L (ref 3.5–5.1)

## 2013-03-03 MED ORDER — SODIUM CHLORIDE 0.9 % IV SOLN: 250.0000 mL | INTRAVENOUS | Status: DC | PRN

## 2013-03-03 MED ORDER — LIDOCAINE-PRILOCAINE 2.5-2.5 % EX CREA: 1.0000 "application " | TOPICAL_CREAM | CUTANEOUS | Status: DC | PRN

## 2013-03-03 MED ORDER — IOHEXOL 300 MG/ML  SOLN: 50.0000 mL | Freq: Once | INTRAMUSCULAR | Status: DC | PRN

## 2013-03-03 MED ORDER — CHLORHEXIDINE GLUCONATE 4 % EX LIQD
CUTANEOUS | Status: AC
  Filled 2013-03-03: qty 30

## 2013-03-03 MED ORDER — INSULIN ASPART 100 UNIT/ML IV SOLN
8.0000 [IU] | Freq: Once | INTRAVENOUS | Status: AC
  Administered 2013-03-03: 15:00:00 via INTRAVENOUS

## 2013-03-03 MED ORDER — MIDAZOLAM HCL 2 MG/2ML IJ SOLN
INTRAMUSCULAR | Status: AC
  Filled 2013-03-03: qty 2

## 2013-03-03 MED ORDER — SODIUM CHLORIDE 0.9 % IJ SOLN: 3.0000 mL | Freq: Two times a day (BID) | INTRAMUSCULAR | Status: DC

## 2013-03-03 MED ORDER — SODIUM CHLORIDE 0.9 % IV SOLN: 100.0000 mL | INTRAVENOUS | Status: DC | PRN

## 2013-03-03 MED ORDER — ACETAMINOPHEN 325 MG PO TABS: 650.0000 mg | ORAL_TABLET | Freq: Four times a day (QID) | ORAL | Status: DC | PRN

## 2013-03-03 MED ORDER — FENTANYL CITRATE 0.05 MG/ML IJ SOLN
INTRAMUSCULAR | Status: AC | PRN
  Administered 2013-03-03 (×4): 50 ug via INTRAVENOUS

## 2013-03-03 MED ORDER — HEPARIN SODIUM (PORCINE) 1000 UNIT/ML IJ SOLN
INTRAMUSCULAR | Status: AC
  Filled 2013-03-03: qty 1

## 2013-03-03 MED ORDER — FENTANYL CITRATE 0.05 MG/ML IJ SOLN
INTRAMUSCULAR | Status: DC | PRN
  Administered 2013-03-03: 50 ug via INTRAVENOUS

## 2013-03-03 MED ORDER — HEPARIN SODIUM (PORCINE) 1000 UNIT/ML DIALYSIS
1000.0000 [IU] | INTRAMUSCULAR | Status: DC | PRN
  Administered 2013-03-04: 4500 [IU] via INTRAVENOUS_CENTRAL

## 2013-03-03 MED ORDER — MIDAZOLAM HCL 2 MG/2ML IJ SOLN
INTRAMUSCULAR | Status: DC | PRN
  Administered 2013-03-03: 1 mg via INTRAVENOUS

## 2013-03-03 MED ORDER — FENTANYL CITRATE 0.05 MG/ML IJ SOLN
INTRAMUSCULAR | Status: AC
  Filled 2013-03-03: qty 2

## 2013-03-03 MED ORDER — MIDAZOLAM HCL 2 MG/2ML IJ SOLN
INTRAMUSCULAR | Status: AC
  Filled 2013-03-03: qty 4

## 2013-03-03 MED ORDER — SODIUM CHLORIDE 0.9 % IJ SOLN: 3.0000 mL | INTRAMUSCULAR | Status: DC | PRN

## 2013-03-03 MED ORDER — SODIUM CHLORIDE 0.9 % IV SOLN: Freq: Once | INTRAVENOUS | Status: DC

## 2013-03-03 MED ORDER — SODIUM CHLORIDE 0.9 % IV SOLN
1.0000 g | Freq: Once | INTRAVENOUS | Status: AC
  Administered 2013-03-03: 1 g via INTRAVENOUS
  Filled 2013-03-03: qty 10

## 2013-03-03 MED ORDER — ACETAMINOPHEN 650 MG RE SUPP
650.0000 mg | Freq: Four times a day (QID) | RECTAL | Status: DC | PRN
  Filled 2013-03-03: qty 1

## 2013-03-03 MED ORDER — ALTEPLASE 2 MG IJ SOLR
2.0000 mg | Freq: Once | INTRAMUSCULAR | Status: AC | PRN
  Administered 2013-03-03: 2 mg
  Filled 2013-03-03 (×2): qty 2

## 2013-03-03 MED ORDER — LIDOCAINE HCL (PF) 1 % IJ SOLN: 5.0000 mL | INTRAMUSCULAR | Status: DC | PRN

## 2013-03-03 MED ORDER — DEXTROSE 50 % IV SOLN
1.0000 | Freq: Once | INTRAVENOUS | Status: AC
  Administered 2013-03-03: 50 mL via INTRAVENOUS

## 2013-03-03 MED ORDER — VANCOMYCIN HCL IN DEXTROSE 1-5 GM/200ML-% IV SOLN
1000.0000 mg | Freq: Once | INTRAVENOUS | Status: AC
  Administered 2013-03-03: 1000 mg via INTRAVENOUS
  Filled 2013-03-03: qty 200

## 2013-03-03 MED ORDER — FENTANYL CITRATE 0.05 MG/ML IJ SOLN
INTRAMUSCULAR | Status: AC
  Filled 2013-03-03: qty 4

## 2013-03-03 MED ORDER — VANCOMYCIN HCL IN DEXTROSE 1-5 GM/200ML-% IV SOLN
1000.0000 mg | Freq: Once | INTRAVENOUS | Status: DC
  Filled 2013-03-03: qty 200

## 2013-03-03 MED ORDER — MORPHINE SULFATE 2 MG/ML IJ SOLN: 2.0000 mg | INTRAMUSCULAR | Status: DC | PRN

## 2013-03-03 MED ORDER — MIDAZOLAM HCL 2 MG/2ML IJ SOLN
INTRAMUSCULAR | Status: AC | PRN
  Administered 2013-03-03 (×4): 1 mg via INTRAVENOUS

## 2013-03-03 MED ORDER — PENTAFLUOROPROP-TETRAFLUOROETH EX AERO: 1.0000 "application " | INHALATION_SPRAY | CUTANEOUS | Status: DC | PRN

## 2013-03-03 MED ORDER — NEPRO/CARBSTEADY PO LIQD: 237.0000 mL | ORAL | Status: DC | PRN

## 2013-03-03 NOTE — H&P (Signed)
[name] is an 47 y.o. male.  Chief Complaint: Clotted HD graft HPI: 47 yo AAM w hx of HIV,HTN and ESRD had clotted access and underwent declot in IR on 02/27/13.  Access reclotted within 72 hours and patient had R neck tunneled HD cath placed this morning by IR (externel jugular cath).  Pre procedure K+ was >7.5.  Sent upstairs for HD and arterial port of cath didni't work.  TPA did not help, sent back down to IR this afternoon after rec'g IV Ca+=, insulin and glucose.  QRS was narrow on telemetry  prior to getting these meds. As of 4:45 IR team couldn't get R sided cath to thread despite multiple attempts without kinking.  Plan is for a L-sided permananent or temp HD cath and then HD tonight.  Pt was without c/o's at time of examination.    ROS  not available   Past Medical History:  Past Medical History  Diagnosis Date  . HIV infection   . Chronic kidney disease   . DJD (degenerative joint disease)     right knee  . Tertiary syphilis   . Genital herpes   . Anal condyloma   . Thyroid disease   . Hypertension   . Family history of disseminated HSV infection   . Renal disorder   . Abdominal abscess     Past Surgical History:  Past Surgical History  Procedure Laterality Date  . Av fistula placement  06/16/11    Right brachiocephalic AVF  . Appendectomy    . Abscess drainage  abdominal abscess    Family History: No family history on file. Social History:  reports that he has been smoking Cigarettes.  He has a 7.5 pack-year smoking history. He has never used smokeless tobacco. He reports that he does not drink alcohol or use illicit drugs.  Allergies:  Allergies  Allergen Reactions  . Contrast Media (Iodinated Diagnostic Agents) Hives and Itching  . Acetaminophen-Codeine Nausea And Vomiting  . Oxycodone-Acetaminophen Nausea And Vomiting  . Penicillins Hives    Home medications: Prior to Admission medications   Medication Sig Start Date End Date Taking?  Authorizing Provider  Acetaminophen (TYLENOL ARTHRITIS PAIN PO) Take 1-2 tablets by mouth every 8 (eight) hours as needed (headache or pain).     Historical Provider, MD  atazanavir (REYATAZ) 200 MG capsule Take 200 mg by mouth daily with breakfast.    Historical Provider, MD  azithromycin (ZITHROMAX) 250 MG tablet Take one tab daily starting tomorrow 01/20/13   Olivia Mackie, MD  calcium carbonate 200 MG capsule Take 250 mg by mouth 2 (two) times daily with a meal.    Historical Provider, MD  Darunavir Ethanolate (PREZISTA) 800 MG tablet Take 1 tablet (800 mg total) by mouth daily with breakfast. 10/04/12   Cliffton [name], MD  fluticasone (FLONASE) 50 MCG/ACT nasal spray Place 2 sprays into the nose daily. 01/20/13   Olivia Mackie, MD  HYDROcodone-acetaminophen (NORCO/VICODIN) 5-325 MG per tablet Take 2 tablets by mouth every 4 (four) hours as needed for pain. 01/20/13   Olivia Mackie, MD  ibuprofen (ADVIL,MOTRIN) 200 MG tablet Take 400 mg by mouth every 6 (six) hours as needed for pain.     Historical Provider, MD  loratadine (CLARITIN) 10 MG tablet Take 1 tablet (10 mg total) by mouth daily. 01/20/13   Olivia Mackie, MD  ritonavir (NORVIR) 100 MG capsule Take 1 capsule (100 mg total) by mouth daily. 07/19/12   Cliffton [name],  MD  tenofovir (VIREAD) 300 MG tablet Take 1 tablet (300 mg total) by mouth once a week. Tuesday and thursday 10/18/12   Cliffton [name], MD  valACYclovir (VALTREX) 500 MG tablet Take 1 tablet (500 mg total) by mouth daily. 08/11/12   Cliffton [name], MD    Labs: Liver Function Tests: No results found for this basename: AST, ALT, ALKPHOS, BILITOT, PROT, ALBUMIN,  in the last 168 hours No results found for this basename: LIPASE, AMYLASE,  in the last 168 hours PT/INR: @LABRCNTIP (inr:5) Cardiac Enzymes: )No results found for this basename: TROPONINI,  in the last 168 hours CBG: No results found for this basename: GLUCAP,  in the last 168 hours CBC No results found for this basename:  WBC, NEUTROABS, HGB, HCT, MCV, PLT,  in the last 168 hours Basic Metabolic Panel:  Recent Labs Lab 02/27/13 0845 03/03/13 0743 03/03/13 1134  K >7.5* 7.4* >7.5*    Physical Exam:  Blood pressure 108/69, pulse 67, temperature 96.6 F (35.9 C), temperature source Oral, resp. rate 16, weight 0 kg (0 lb), SpO2 99.00%. Gen: groggy adult AAM no distress HEENT:  EOMI, sclera anicteric, throat clear Neck: no JVD, no LAN Chest: clear bilat CV: regular, no rub or gallop, no murmur, pedal pulses intact Abdomen: soft, nontender, nondistended Ext: no LE or UE edema, no joint effusion or deformity, no gangrene or ulceration Neuro: sedated from meds earlier today, on dialysis at time of exam Access: R neck tunneled HD cath in place, RUE AVF   Outpatient HD: pending   Impression 1. Clotted dialysis access 2. Hyperkalemia- s/p IV insulin, glucose and calcium 3. ESRD 4. HIV 5. Hx tertiary syphilis  Plan-  HD tonight once access is established per IR MD. Keep overnight, repeat K in am    Vinson Moselle  MD [facility] [phone] pgr    [phone] cell 03/03/2013, 5:25 PM

## 2013-03-03 NOTE — Procedures (Signed)
Post-Procedure Note  Pre-operative Diagnosis: ESRD and needs hemodialysis       Post-operative Diagnosis: Same   Indications:   Occluded arm access  Procedure Details:   Exchange of right chest catheter and eventual removal of right chest catheter.  Placement of new left jugular dialysis catheter.  See radiology report.  Findings: Right chest catheter was kinked near clavicle and unable to resolve kinking problem.  New left jugular catheter in right atrium and ready to use.        Condition: Stable  Plan: Ready to use left chest dialysis catheter.

## 2013-03-03 NOTE — H&P (Signed)
[name] is an 47 y.o. male.   Chief Complaint: Declot of Rt upper arm graft 6/1- successful but residual thrombus Clotted now Last dialysis 6/2 Scheduled now for tunnelled hemo dialysis catheter placement Potassium 7.4 today HPI: HIV; CKD; tertiary syphilis; herpes; HTN  Past Medical History  Diagnosis Date  . HIV infection   . Chronic kidney disease   . DJD (degenerative joint disease)     right knee  . Tertiary syphilis   . Genital herpes   . Anal condyloma   . Thyroid disease   . Hypertension   . Family history of disseminated HSV infection   . Renal disorder   . Abdominal abscess     Past Surgical History  Procedure Laterality Date  . Av fistula placement  06/16/11    Right brachiocephalic AVF  . Appendectomy    . Abscess drainage  abdominal abscess    History reviewed. No pertinent family history. Social History:  reports that he has been smoking Cigarettes.  He has a 7.5 pack-year smoking history. He has never used smokeless tobacco. He reports that he does not drink alcohol or use illicit drugs.  Allergies:  Allergies  Allergen Reactions  . Contrast Media (Iodinated Diagnostic Agents) Hives and Itching  . Acetaminophen-Codeine Nausea And Vomiting  . Oxycodone-Acetaminophen Nausea And Vomiting  . Penicillins Hives     (Not in a hospital admission)  Results for orders placed during the hospital encounter of 03/03/13 (from the past 48 hour(s))  POTASSIUM     Status: Abnormal   Collection Time    03/03/13  7:43 AM      Result Value Range   Potassium 7.4 (*) 3.5 - 5.1 mEq/L   Comment: NO VISIBLE HEMOLYSIS     CRITICAL RESULT CALLED TO, READ BACK BY AND VERIFIED WITH:     JULIE CUSHMAN,RN AT 0825 03/03/13 BY ZPERRY.   No results found.  Review of Systems  Constitutional: Positive for malaise/fatigue. Negative for fever.  Respiratory: Negative for shortness of breath.   Cardiovascular: Negative for chest pain.  Gastrointestinal: Negative for  nausea, vomiting and abdominal pain.  Neurological: Positive for weakness.    Blood pressure 168/70, pulse 69, temperature 97.4 F (36.3 C), temperature source Oral, resp. rate 14, SpO2 100.00%. Physical Exam  Constitutional: He is oriented to person, place, and time. He appears well-developed.  Cardiovascular: Normal rate and regular rhythm.   Murmur heard. Respiratory: Effort normal and breath sounds normal. He has no wheezes.  GI: Soft. Bowel sounds are normal. There is no tenderness.  Musculoskeletal: Normal range of motion. He exhibits no edema.  Neurological: He is alert and oriented to person, place, and time.  Very groggy this morning  Skin: Skin is warm and dry.  Psychiatric:  Groggy Consented fiance     Assessment/Plan Clotted rt arm dialysis graft Declot 6/1- reclotted Scheduled now for tunnelled HD cath placement Pt and fiance are aware of procedure benefits and risks and agreeable to proceed Consent signed and in chart  Emberli Ballester A 03/03/2013, 8:39 AM

## 2013-03-03 NOTE — ED Notes (Signed)
Pt going to in HD in house for K+ of 7.5

## 2013-03-03 NOTE — Progress Notes (Signed)
Dr Arlean Hopping notified of cath issues, orders given for TPA to dwell x1hour then attempt to resume tx.

## 2013-03-03 NOTE — Progress Notes (Signed)
CRITICAL VALUE ALERT  Critical value received:  Potassium 7.2  Date of notification: 03/03/2013  Time of notification: 1945  Critical value read back:yes  Nurse who received alert: Cristy Friedlander  MD notified (1st page):  Dr. Hyman Hopes  Time of first page: 2000  MD notified (2nd page):  Time of second page:  Responding MD:  Dr. Hyman Hopes  Time MD responded: 2000

## 2013-03-03 NOTE — Progress Notes (Signed)
Patient ID: [name], male   DOB: 1966-03-03, 47 y.o.   MRN: [id number] Pt scheduled for manipulation/possible exchange of existing right EJ tunneled cath or placement of new catheter today secondary to poor aspirations /flushes of arterial port. Plans d/w pt's fiance with her understanding and consent. Hyperkalemia to be addressed prior to intervention.

## 2013-03-03 NOTE — ED Notes (Signed)
Time-out charted under Doc-Flowsheets; had not planned to sedate pt but MD wants sedation.

## 2013-03-03 NOTE — Progress Notes (Signed)
Pt arrived to hemodialysis at 0955 this am from IR post cath placement. Report received from IR RN who states his K=7.4 and also that his cath placement has been confirmed. However, I was unable to confirm placement as there was no note or x-ray report from the radiologist. Dr Arlean Hopping notified and he confirmed cath placement per xray and orders given to use. While accessing the cath, venous port noted to flush and aspirate easily, however, the arterial port flushes and aspirates very sluggishly.Attempted to put pt on the machine and pressure too high to run.  Gerome Apley, PA notified and told me to notify Dr Arlean Hopping who has been paged x2, awaiting call  Back.

## 2013-03-03 NOTE — Progress Notes (Signed)
CRITICAL VALUE ALERT  Critical value received:  Potassium >7.5  Date of notification:  03/03/13  Time of notification:  1310  Critical value read back:yes  Nurse who received alert:  Ollen Barges  MD notified (1st page):  Delano Metz  Time of first page:  1310  MD notified (2nd page):  Time of second page:  Responding MD:  Delano Metz  Time MD responded:  1311

## 2013-03-03 NOTE — Progress Notes (Signed)
Arterial port still with very sluggish flush and aspiration noted after TPA dwell. Dr Arlean Hopping notified and pt is to be sent back to IR for placement adjustment. Also notified Dr Arlean Hopping re pts pottasium is now >7.5. Orders given for Ca gluconate, d50 and insulin to be given IV prior to transfer to IR.

## 2013-03-03 NOTE — Progress Notes (Signed)
Dr. Hyman Hopes called and informed of pre hemodialysis K of 7.2, and of his BP dropping in the 70's. Pt is to be kept even. Pt ran for 30 min on a 0k bath and is running on a 1k for one hour then a 2k. I will send a K level when he is changed to a 2k bath per rountine orders.

## 2013-03-04 ENCOUNTER — Telehealth (HOSPITAL_COMMUNITY): Payer: Self-pay | Admitting: *Deleted

## 2013-03-04 ENCOUNTER — Encounter (HOSPITAL_COMMUNITY): Payer: Self-pay | Admitting: General Practice

## 2013-03-04 DIAGNOSIS — N19 Unspecified kidney failure: Secondary | ICD-10-CM

## 2013-03-04 DIAGNOSIS — N186 End stage renal disease: Secondary | ICD-10-CM

## 2013-03-04 LAB — COMPREHENSIVE METABOLIC PANEL
Albumin: 2.8 g/dL — ABNORMAL LOW (ref 3.5–5.2)
Alkaline Phosphatase: 173 U/L — ABNORMAL HIGH (ref 39–117)
BUN: 39 mg/dL — ABNORMAL HIGH (ref 6–23)
Calcium: 8.4 mg/dL (ref 8.4–10.5)
Creatinine, Ser: 12.12 mg/dL — ABNORMAL HIGH (ref 0.50–1.35)
Potassium: 4.5 mEq/L (ref 3.5–5.1)
Total Protein: 6.6 g/dL (ref 6.0–8.3)

## 2013-03-04 LAB — CBC
HCT: 34.9 % — ABNORMAL LOW (ref 39.0–52.0)
Hemoglobin: 12.2 g/dL — ABNORMAL LOW (ref 13.0–17.0)
MCV: 98.9 fL (ref 78.0–100.0)
RBC: 3.53 MIL/uL — ABNORMAL LOW (ref 4.22–5.81)
WBC: 13.4 10*3/uL — ABNORMAL HIGH (ref 4.0–10.5)

## 2013-03-04 MED FILL — Dextrose Inj 50%: INTRAVENOUS | Qty: 50 | Status: AC

## 2013-03-04 NOTE — Progress Notes (Signed)
UR COMPLETED  

## 2013-03-04 NOTE — Consult Note (Signed)
[facility] [place] CONSULT NOTE 03/04/2013 DOB: [id number] MRN : [id number]  BJ:[id number] access Referring Physician:Robert D Schertz, MD   History of Present Illness: He was admitted secondary to non functioning right AV fistula.  He has had this fistula for 6 years and it stopped working 1 week ago.  He was seen by IR for thrombectomy of the fistula twice.  At this point it is a non-functioning fistula.  He has a diatek catheter in the left IJ.  We will plan new access as an out patient.  Complicating medical history HIV; CKD; tertiary syphilis; herpes; HTN    Past Medical History  Diagnosis Date  . HIV infection   . DJD (degenerative joint disease)     right knee  . Tertiary syphilis   . Genital herpes   . Anal condyloma   . Hypertension   . Family history of disseminated HSV infection   . Shortness of breath     "can happen at anytime" (03/04/2013)  . ESRD (end stage renal disease) on dialysis     "[facility]; TTS" (03/04/2013)    Past Surgical History  Procedure Laterality Date  . Av fistula placement, brachiocephalic Right 06/16/11  . Appendectomy    . Tonsillectomy    . Cholecystectomy    . Incision and drainage abscess      abdominal     ROS: [x]  Positive  [ ]  Denies    General: [ ]  Weight loss, [ ]  Fever, [ ]  chills Neurologic: [ ]  Dizziness, [ ]  Blackouts, [ ]  Seizure [ ]  Stroke, [ ]  "Mini stroke", [ ]  Slurred speech, [ ]  Temporary blindness; [x ] weakness in arms or legs, [ ]  Hoarseness Cardiac: [ ]  Chest pain/pressure, [ ]  Shortness of breath at rest [ ]  Shortness of breath with exertion, [ ]  Atrial fibrillation or irregular heartbeat Vascular: [ ]  Pain in legs with walking, [ ]  Pain in legs at rest, [ ]  Pain in legs at night,  [ ]  Non-healing ulcer, [ ]  Blood clot in vein/DVT,   Pulmonary: [ ]  Home oxygen, [ ]  Productive cough, [ ]  Coughing up blood, [ ]  Asthma,  [ ]  Wheezing Musculoskeletal:  [ ]  Arthritis, [ ]  Low back pain, [ ]  Joint  pain Hematologic: [ ]  Easy Bruising, [ ]  Anemia; [ ]  Hepatitis Gastrointestinal: [ ]  Blood in stool, [ ]  Gastroesophageal Reflux/heartburn, [ ]  Trouble swallowing Urinary: [x ] chronic Kidney disease, [x ] on HD - [ ]  MWF or [ ]  TTHS, [ ]  Burning with urination, [ ]  Difficulty urinating Skin: [ ]  Rashes, [ ]  Wounds Psychological: [ ]  Anxiety, [ ]  Depression  Social History History  Substance Use Topics  . Smoking status: Current Every Day Smoker -- 1.00 packs/day for 27 years    Types: Cigarettes  . Smokeless tobacco: Never Used  . Alcohol Use: Yes     Comment: 03/04/2013 "stopped drinking ~ 12 yr ago; never had problem w/it"    Family History   Allergies  Allergen Reactions  . Contrast Media (Iodinated Diagnostic Agents) Hives and Itching  . Acetaminophen-Codeine Nausea And Vomiting  . Oxycodone-Acetaminophen Nausea And Vomiting  . Penicillins Hives   Family history:Father HTN, CAD                          Mother HTN  Current Facility-Administered Medications  Medication Dose Route Frequency Provider Last Rate Last Dose  . 0.9 %  sodium chloride infusion  250 mL Intravenous PRN Maree Krabbe, MD      . 0.9 %  sodium chloride infusion   Intravenous Once Robet Leu, PA-C      . acetaminophen (TYLENOL) tablet 650 mg  650 mg Oral Q6H PRN Maree Krabbe, MD       Or  . acetaminophen (TYLENOL) suppository 650 mg  650 mg Rectal Q6H PRN Maree Krabbe, MD      . morphine 2 MG/ML injection 2 mg  2 mg Intravenous Q4H PRN Maree Krabbe, MD      . sodium chloride 0.9 % injection 3 mL  3 mL Intravenous Q12H Maree Krabbe, MD      . sodium chloride 0.9 % injection 3 mL  3 mL Intravenous PRN Maree Krabbe, MD      . vancomycin (VANCOCIN) IVPB 1000 mg/200 mL premix  1,000 mg Intravenous Once Robet Leu, PA-C          Significant Diagnostic Studies: CBC Lab Results  Component Value Date   WBC 13.4* 03/04/2013   HGB 12.2* 03/04/2013   HCT 34.9* 03/04/2013   MCV  98.9 03/04/2013   PLT 52* 03/04/2013    BMET    Component Value Date/Time   NA 137 03/04/2013 0645   K 4.5 03/04/2013 0645   CL 98 03/04/2013 0645   CO2 22 03/04/2013 0645   GLUCOSE 112* 03/04/2013 0645   BUN 39* 03/04/2013 0645   CREATININE 12.12* 03/04/2013 0645   CREATININE 10.78* 02/11/2012 1436   CALCIUM 8.4 03/04/2013 0645   GFRNONAA 4* 03/04/2013 0645   GFRAA 5* 03/04/2013 0645    COAG Lab Results  Component Value Date   INR 0.99 03/03/2013   No results found for this basename: PTT     Physical Examination BP Readings from Last 3 Encounters:  03/04/13 106/69  03/03/13 130/86  02/27/13 117/63   Temp Readings from Last 3 Encounters:  03/04/13 98.1 F (36.7 C) Oral  03/03/13 97.4 F (36.3 C) Oral  02/27/13 97.5 F (36.4 C) Oral   SpO2 Readings from Last 3 Encounters:  03/04/13 98%  03/03/13 100%  02/27/13 99%   Pulse Readings from Last 3 Encounters:  03/04/13 97  03/03/13 48  02/27/13 96    General:  WDWN in NAD sleepy HENT: WNL Eyes: Pupils equal Pulmonary: normal non-labored breathing , without Rales, rhonchi,  wheezing Cardiac: RRR, with positive  Murmurs, rubs or gallops; Abdomen: soft, NT, no masses Skin: no rashes, ulcers noted Vascular Exam/Pulses:palpable radial and brachial pulses bilateral  Extremities without ischemic changes, no Gangrene , no cellulitis; no open wounds;  Musculoskeletal: no muscle wasting or atrophy  Neurologic: A&O X 3; Appropriate Affect ;  SENSATION: normal; MOTOR FUNCTION: Pt has good and equal strength in all extremities - 5/5 Speech is fluent/normal  Non-Invasive Vascular Imaging:  Pending STAT ordered vein mapping  ASSESSMENT/PLAN: Need for dialysis access He will follow up as an out patient for plan of AV fistula verses graft. He has a working IJ catheter F/U  In the office in 2 weeks [place], EMMA [employer], EMMA [facility] 03/04/2013 10:59 AM  History and exam details as above.  Prior left arm fistula occluded  in upper arm, now with right upper arm fistula degenerated and occluded.  Has functioning left side catheter  Will get vein mapping today to determine next access.  Will schedule procedure next 1-2 weeks on basis of vein  map.  Plan discussed with pt.  Fabienne Bruns, MD [facility] [place] Office: [phone] Pager: [phone]

## 2013-03-04 NOTE — Discharge Summary (Signed)
Physician Discharge Summary  Patient ID: [name] MRN: [id number] DOB/AGE: March 19, 1966 47 y.o.  Admit date: 03/03/2013 Discharge date: 03/04/2013  Admission Diagnoses 1. Clotted RUA AVF 2. Hyperkalemia 3. ESRD on dialysis 4. HIV 5. Hx tertiary syphilis  Discharge Diagnosis/Hospital Course:  1. Clotted dialysis access- RUA AV fistula clotted on 02/27/13, declotted by IR. Reclotted on 03/02/13 and IR placed L IJ tunneled HD catheter same day (a right external jugular tunneled HD cath was attempted first but did not function due to kinking across the clavicle). Pt was seen by [facility] on same admission 6/614 and said RUA AVF no longer usable, they ordered vein mapping and will see him as outpatient to set up next permanent access.    2. Hyperkalemia- treated with insulin, glu, D5W then with HD. K down from >7.5 to 4.5 on day of discharge  3. ESRD- had HD inpt on Thursday 6/5, resume usual TTS schedule  4. HIV- no change in meds 5. Hx tertiary syphilis  Discharge Exam: Blood pressure 108/69, pulse 67, temperature 96.6 F (35.9 C), temperature source Oral, resp. rate 16, weight 0 kg (0 lb), SpO2 99.00%.  Gen: groggy adult AAM no distress  HEENT: EOMI, sclera anicteric, throat clear  Neck: no JVD, no LAN  Chest: clear bilat  CV: regular, no rub or gallop, no murmur, pedal pulses intact  Abdomen: soft, nontender, nondistended  Ext: no LE or UE edema, no joint effusion or deformity, no gangrene or ulceration  Neuro: sedated from meds earlier today, on dialysis at time of exam  Access: R neck tunneled HD cath in place, RUE AVF  Outpatient HD: pending  6.  7.  Significant Diagnostic Studies: vein mapping, results pend  Treatments: Hemodialysis Consults:  Dr Darrick Penna, Vasc Surgery  Disposition: 01-Home or Self Care     Medication List    TAKE these medications       atazanavir 200 MG capsule  Commonly known as:  REYATAZ  Take 200 mg by mouth daily with breakfast.     calcium  carbonate 200 MG capsule  Take 250 mg by mouth 2 (two) times daily with a meal.     Darunavir Ethanolate 800 MG tablet  Commonly known as:  PREZISTA  Take 1 tablet (800 mg total) by mouth daily with breakfast.     fluticasone 50 MCG/ACT nasal spray  Commonly known as:  FLONASE  Place 2 sprays into the nose daily.     HYDROcodone-acetaminophen 5-325 MG per tablet  Commonly known as:  NORCO/VICODIN  Take 2 tablets by mouth every 4 (four) hours as needed for pain.     ibuprofen 200 MG tablet  Commonly known as:  ADVIL,MOTRIN  Take 400 mg by mouth every 6 (six) hours as needed for pain.     loratadine 10 MG tablet  Commonly known as:  CLARITIN  Take 1 tablet (10 mg total) by mouth daily.     ritonavir 100 MG capsule  Commonly known as:  NORVIR  Take 1 capsule (100 mg total) by mouth daily.     tenofovir 300 MG tablet  Commonly known as:  VIREAD  Take 1 tablet (300 mg total) by mouth once a week. Tuesday and thursday     TYLENOL ARTHRITIS PAIN PO  Take 1-2 tablets by mouth every 8 (eight) hours as needed (headache or pain).     valACYclovir 500 MG tablet  Commonly known as:  VALTREX  Take 500 mg by mouth daily as needed. As  needed for herpes outbreaks.         SignedDelano Metz D 03/04/2013, 11:15 AM

## 2013-03-04 NOTE — Progress Notes (Signed)
VASCULAR LAB PRELIMINARY  PRELIMINARY  PRELIMINARY  PRELIMINARY  Right  Upper Extremity Vein Map    Cephalic  Segment Diameter Depth Comment  1. Axilla mm mm   2. Mid upper arm mm mm   3. Above AC mm mm   4. In AC 3.78 mm mm   5. Below AC mm mm   6. Mid forearm 1.83 mm mm   7. Wrist mm mm Thrombosed    mm mm    mm mm    mm mm    Basilic  Segment Diameter Depth Comment  1. Axilla mm mm   2. Mid upper arm 4.64 mm 7.33mm   3. Above AC 3.53 mm mm Crosses to the cephalic vein  4. In AC mm mm   5. Below AC mm mm   6. Mid forearm mm mm   7. Wrist mm mm    mm mm    mm mm    mm mm    Left Upper Extremity Vein Map    Cephalic  Segment Diameter Depth Comment  1. Axilla 2.71 mm mm   2. Mid upper arm mm mm Thrombosed   3. Above AC mm mm Thrombosed   4. In AC mm mm   5. Below AC mm mm   6. Mid forearm 1.58 mm mm   7. Wrist 1.5 mm mm    mm mm    mm mm    mm mm    Basilic  Segment Diameter Depth Comment  1. Axilla mm mm   2. Mid upper arm 3.32mm 13.9 mm   3. Above AC 3.09 mm 7.16 mm   4. In AC mm mm   5. Below AC 1.99 mm 2.6mm   6. Mid forearm mm mm   7. Wrist mm mm    mm mm    mm mm    mm mm     Kesa Birky, RVT 03/04/2013, 5:03 PM

## 2013-03-04 NOTE — Progress Notes (Signed)
Dialysis Tx completed. Initial goal of 3L not met. Pt had frequent drops in BP. Dr. Hyman Hopes aware and changed goal to keep even. BP attempted at many sites due to frequent readings that were questionable. Pt continued to have some drops in BP and was lethargic at times. BP improved post dialysis Tx Mid Tx K level was 3.9.Marland Kitchen

## 2013-03-04 NOTE — Progress Notes (Signed)
Pt signed d/c papers. IV removed. Pt instructed to follow up with [facility] [place] in 2 weeks, office number given. Room checked for belongings.

## 2013-03-08 ENCOUNTER — Other Ambulatory Visit: Payer: Self-pay | Admitting: *Deleted

## 2013-03-08 ENCOUNTER — Encounter: Payer: Self-pay | Admitting: *Deleted

## 2013-03-09 ENCOUNTER — Telehealth: Payer: Self-pay | Admitting: *Deleted

## 2013-03-09 NOTE — Telephone Encounter (Addendum)
Message copied by Melene Plan on Wed Mar 09, 2013 10:33 AM ------      Message from: Sherren Kerns      Created: Sat Mar 05, 2013  5:47 PM       Needs left forearm vs upper arm graft whoever has a slot available for him on a non dialysis day next.            Charles      ----- Message -----         From: Sherren Kerns, MD         Sent: 03/04/2013  11:22 AM           To: Reuel Derby, Melene Plan, RN, #            Schertz requesting      Level 5 consult for new access.  He will have vein map prior to d/c today.  I will let you know what the access plan will be after that.  He will be discharged today.  He is T Th Sat so will need to do procedure on a MWF.            Charles       ------I  Called Mr Koren Bound 03/08/13 to schedule his surgery. We agreed on 03/21/13. He called back today to say he "can not be cut on" again; his body can't take it. He wants to come & talk with Dr Darrick Penna in the next couple of weeks and will schedule then. He also asked if the kidney center could remove the dressing on the right side of his chest where they tried to put the catheter and I said yes. He said they were using the left side catheter.

## 2013-03-18 ENCOUNTER — Encounter (HOSPITAL_COMMUNITY): Payer: Self-pay | Admitting: *Deleted

## 2013-03-18 ENCOUNTER — Emergency Department (HOSPITAL_COMMUNITY)
Admission: EM | Admit: 2013-03-18 | Discharge: 2013-03-18 | Disposition: A | Discharge status: Home / Self Care | Payer: Medicare Other | Attending: Emergency Medicine | Admitting: Emergency Medicine

## 2013-03-18 DIAGNOSIS — Y939 Activity, unspecified: Secondary | ICD-10-CM | POA: Insufficient documentation

## 2013-03-18 DIAGNOSIS — Z880 Allergy status to penicillin: Secondary | ICD-10-CM | POA: Insufficient documentation

## 2013-03-18 DIAGNOSIS — Y929 Unspecified place or not applicable: Secondary | ICD-10-CM | POA: Insufficient documentation

## 2013-03-18 DIAGNOSIS — N186 End stage renal disease: Secondary | ICD-10-CM | POA: Insufficient documentation

## 2013-03-18 DIAGNOSIS — S90424A Blister (nonthermal), right lesser toe(s), initial encounter: Secondary | ICD-10-CM

## 2013-03-18 DIAGNOSIS — Z8739 Personal history of other diseases of the musculoskeletal system and connective tissue: Secondary | ICD-10-CM | POA: Insufficient documentation

## 2013-03-18 DIAGNOSIS — Z8709 Personal history of other diseases of the respiratory system: Secondary | ICD-10-CM | POA: Insufficient documentation

## 2013-03-18 DIAGNOSIS — Z8619 Personal history of other infectious and parasitic diseases: Secondary | ICD-10-CM | POA: Insufficient documentation

## 2013-03-18 DIAGNOSIS — B20 Human immunodeficiency virus [HIV] disease: Secondary | ICD-10-CM | POA: Insufficient documentation

## 2013-03-18 DIAGNOSIS — Z79899 Other long term (current) drug therapy: Secondary | ICD-10-CM | POA: Insufficient documentation

## 2013-03-18 DIAGNOSIS — I120 Hypertensive chronic kidney disease with stage 5 chronic kidney disease or end stage renal disease: Secondary | ICD-10-CM | POA: Insufficient documentation

## 2013-03-18 DIAGNOSIS — Z992 Dependence on renal dialysis: Secondary | ICD-10-CM | POA: Insufficient documentation

## 2013-03-18 DIAGNOSIS — T25239A Burn of second degree of unspecified toe(s) (nail), initial encounter: Secondary | ICD-10-CM | POA: Insufficient documentation

## 2013-03-18 DIAGNOSIS — F17200 Nicotine dependence, unspecified, uncomplicated: Secondary | ICD-10-CM | POA: Insufficient documentation

## 2013-03-18 DIAGNOSIS — X58XXXA Exposure to other specified factors, initial encounter: Secondary | ICD-10-CM | POA: Insufficient documentation

## 2013-03-18 MED ORDER — BACITRACIN ZINC 500 UNIT/GM EX OINT: TOPICAL_OINTMENT | Freq: Two times a day (BID) | CUTANEOUS | Status: DC

## 2013-03-18 NOTE — ED Provider Notes (Signed)
History     CSN: [id number]  Arrival date & time 03/18/13  4098   First MD Initiated Contact with Patient 03/18/13 [phone]      Chief Complaint  Patient presents with  . Toe Pain    (Consider location/radiation/quality/duration/timing/severity/associated sxs/prior treatment) HPI This patient is a 47 year old man with AIDS, tertiary syphilis, end-stage renal disease who appears much older than his stated age.  He presents with complaints of pain over his left small toe. His pain started after he wore a  Presents for a couple of days. This was about 10 days ago. The patient's wife put some "compound W" on the area. Patient is pain free at rest but has severe pain with weight bearing and ambulation. Pain confined only to small toe. Burning, nonradiating. No fever. No purulent drainage. Td utd.    Past Medical History  Diagnosis Date  . HIV infection   . DJD (degenerative joint disease)     right knee  . Tertiary syphilis   . Genital herpes   . Anal condyloma   . Hypertension   . Family history of disseminated HSV infection   . Shortness of breath     "can happen at anytime" (03/04/2013)  . ESRD (end stage renal disease) on dialysis 01/02/2012    Gets diaysis at [facility] on [employer], TTS schedule.  Started dialysis in 2000.     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 Reclotted on 03/02/13 and IR placed L IJ tunneled HD catheter (a right external jugular tunneled HD cath was attempted first but did not function due to kinking across the clavicle). Pt was seen by [facility] on same admission 6/614 and said RUA AVF no longer usable, they ordered vein mapping and will see him as outpatient to set up next permanent access.       Past Surgical History  Procedure Laterality Date  . Av fistula placement, brachiocephalic Right 06/16/11  . Appendectomy    . Tonsillectomy    . Cholecystectomy    . Incision and drainage abscess       abdominal    History reviewed. No pertinent family history.  History  Substance Use Topics  . Smoking status: Current Every Day Smoker -- 1.00 packs/day for 27 years    Types: Cigarettes  . Smokeless tobacco: Never Used  . Alcohol Use: Yes     Comment: 03/04/2013 "stopped drinking ~ 12 yr ago; never had problem w/it"      Review of Systems Gen: no weight loss, fevers, chills, night sweats Eyes: no discharge or drainage, no occular pain or visual changes Nose: no epistaxis or rhinorrhea Mouth: no dental pain, no sore throat Neck: no neck pain Lungs: no SOB, cough, wheezing CV: no chest pain, palpitations, dependent edema or orthopnea Abd: no abdominal pain, nausea, vomiting GU: The patient is anicteric MSK: no myalgias or arthralgias Neuro: no headache, no focal neurologic deficits Extremities dialysis graft is located in the right upper arm, small toe pain as noted above Skin: As per history of present illness, otherwise negative Psyche: negative.  Allergies  Contrast media; Acetaminophen-codeine; Oxycodone-acetaminophen; and Penicillins  Home Medications   Current Outpatient Rx  Name  Route  Sig  Dispense  Refill  . atazanavir (REYATAZ) 200 MG capsule   Oral   Take 200 mg by mouth daily with breakfast.         . Darunavir Ethanolate (PREZISTA) 800  MG tablet   Oral   Take 1 tablet (800 mg total) by mouth daily with breakfast.   30 tablet   11   . fluticasone (FLONASE) 50 MCG/ACT nasal spray   Nasal   Place 2 sprays into the nose daily.   16 g   0   . ritonavir (NORVIR) 100 MG capsule   Oral   Take 1 capsule (100 mg total) by mouth daily.   30 capsule   6   . tenofovir (VIREAD) 300 MG tablet   Oral   Take 1 tablet (300 mg total) by mouth once a week. Tuesday and thursday   4 tablet   11   . valACYclovir (VALTREX) 500 MG tablet   Oral   Take 500 mg by mouth daily as needed. As needed for herpes outbreaks.           BP 130/42  Pulse 72   Temp(Src) 98.7 F (37.1 C) (Oral)  Resp 18  SpO2 97%  Physical Exam Gen: well developed and chronically ill-appearing, appears much older than stated age of 22 Head: NCAT Eyes: PERL, EOMI Nose: no epistaixis or rhinorrhea Mouth/throat: mucosa is moist and pink Neck: supple, no stridor Lungs: CTA B, no wheezing, rhonchi or rales Heart-regular rate and rhythm, no murmur Abd: soft, notender, nondistended Back: no ttp, no cva ttp, kyphosis Skin: no rashese, wnl Extremities-graft in the right upper arm is nontender with good thrill, there is an approximately 7 mm in diameter blister over the anterolateral aspect of the left small toe. There is tenderness on palpation of his toe. No other skin changes are noted. Neuro: CN ii-xii grossly intact, no focal deficits Psyche; normal affect,  calm and cooperative.   ED Course  Procedures (including critical care time)  No results found for this or any previous visit (from the past 24 hour(s)).     MDM  Patient has a blister. Stable for discharge. Advised to wear flip flops. Will prescribe Bacitracin.         Brandt Loosen, MD 03/18/13 [phone]

## 2013-03-21 ENCOUNTER — Ambulatory Visit: Admit: 2013-03-21 | Payer: Medicare Other | Admitting: Vascular Surgery

## 2013-03-21 SURGERY — INSERTION OF ARTERIOVENOUS (AV) GORE-TEX GRAFT ARM
Anesthesia: Monitor Anesthesia Care | Site: Arm Lower | Laterality: Left

## 2013-03-28 ENCOUNTER — Encounter: Payer: Self-pay | Admitting: Internal Medicine

## 2013-03-28 ENCOUNTER — Ambulatory Visit (INDEPENDENT_AMBULATORY_CARE_PROVIDER_SITE_OTHER): Payer: Medicare Other | Admitting: Internal Medicine

## 2013-03-28 ENCOUNTER — Other Ambulatory Visit: Payer: Self-pay | Admitting: Internal Medicine

## 2013-03-28 ENCOUNTER — Telehealth: Payer: Self-pay | Admitting: *Deleted

## 2013-03-28 VITALS — BP 181/139 | HR 74 | Temp 98.5°F | Ht 71.0 in | Wt 159.2 lb

## 2013-03-28 DIAGNOSIS — Z79899 Other long term (current) drug therapy: Secondary | ICD-10-CM

## 2013-03-28 DIAGNOSIS — B20 Human immunodeficiency virus [HIV] disease: Secondary | ICD-10-CM

## 2013-03-28 LAB — CBC
Hemoglobin: 11.4 g/dL — ABNORMAL LOW (ref 13.0–17.0)
MCH: 32.5 pg (ref 26.0–34.0)
RBC: 3.51 MIL/uL — ABNORMAL LOW (ref 4.22–5.81)
WBC: 5.8 10*3/uL (ref 4.0–10.5)

## 2013-03-28 NOTE — Telephone Encounter (Signed)
Patient called requesting an appointment with Dr. Orvan Falconer as soon as possible. He states he has had a lot of trouble with his hemodialysis fistula and has had multiple surgeries to fix it over the past few weeks.  He states that this has led him to become confused and get off track with his HIV medications.  Pt given appointment for today at 3:30 with Dr. Orvan Falconer.

## 2013-03-28 NOTE — Progress Notes (Signed)
Patient ID: [name], male   DOB: 14-Dec-1965, 47 y.o.   MRN: [id number]          [facility]  Patient Active Problem List   Diagnosis Date Noted  . ESRD (end stage renal disease) on dialysis 01/02/2012    Priority: High  . HIV DISEASE 10/19/2006    Priority: High  . CIGARETTE SMOKER 10/19/2006    Priority: High  . 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 PERIPHERAL NEUROPATHY 04/10/2009  . BACTEREMIA, MYCOBACTERIUM AVIUM COMPLEX 10/19/2006  . HERPES, GENITAL NEC 10/19/2006  . WARTS, OTHER SPECIFIED VIRAL 10/19/2006  . SYPHILIS NOS 10/19/2006  . DEPRESSION 10/19/2006    Patient's Medications  New Prescriptions   No medications on file  Previous Medications   ATAZANAVIR (REYATAZ) 200 MG CAPSULE    Take 200 mg by mouth daily with breakfast.   BACITRACIN OINTMENT    Apply topically 2 (two) times daily.   DARUNAVIR ETHANOLATE (PREZISTA) 800 MG TABLET    Take 1 tablet (800 mg total) by mouth daily with breakfast.   FLUTICASONE (FLONASE) 50 MCG/ACT NASAL SPRAY    Place 2 sprays into the nose daily.   RITONAVIR (NORVIR) 100 MG CAPSULE    Take 1 capsule (100 mg total) by mouth daily.   TENOFOVIR (VIREAD) 300 MG TABLET    Take 1 tablet (300 mg total) by mouth once a week. Tuesday and thursday   VALACYCLOVIR (VALTREX) 500 MG TABLET    Take 500 mg by mouth daily as needed. As needed for herpes outbreaks.  Modified Medications   No medications on file  Discontinued Medications   No medications on file    Subjective: [name] is seen on work in basis. He called today because he was concerned that he had been missing doses of his medications. He blames this on stress. About one month ago his right arm AV fistula clotted and he was hospitalized and underwent several procedures. His fistula could not be declotted and he left the hospital with a left chest hemodialysis catheter. He feels like the anesthesia made him more forgetful for a period  of time. He also notes added stress because of multiple court appearances required for his stepson recently. His fiance, who normally helps him fill out his pillbox, underwent oral surgery recently and was unable to help him. She did start filling out his pillbox again yesterday.   Review of Systems: Pertinent items are noted in HPI.  Past Medical History  Diagnosis Date  . HIV infection   . DJD (degenerative joint disease)     right knee  . Tertiary syphilis   . Genital herpes   . Anal condyloma   . Hypertension   . Family history of disseminated HSV infection   . Shortness of breath     "can happen at anytime" (03/04/2013)  . ESRD (end stage renal disease) on dialysis 01/02/2012    Gets diaysis at [facility] on [employer], TTS schedule.  Started dialysis in 2000.     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 Reclotted on 03/02/13 and IR placed L IJ tunneled HD catheter (a right external jugular tunneled HD cath was attempted first but did not function due to kinking across the clavicle). Pt was seen by [facility] on same admission 6/614 and said RUA AVF no longer usable, they ordered vein mapping and will see him as outpatient  to set up next permanent access.       History  Substance Use Topics  . Smoking status: Current Every Day Smoker -- 0.50 packs/day for 27 years    Types: Cigarettes  . Smokeless tobacco: Never Used  . Alcohol Use: Yes     Comment: 03/04/2013 "stopped drinking ~ 12 yr ago; never had problem w/it"    No family history on file.  Allergies  Allergen Reactions  . Contrast Media (Iodinated Diagnostic Agents) Hives and Itching  . Acetaminophen-Codeine Nausea And Vomiting  . Oxycodone-Acetaminophen Nausea And Vomiting  . Penicillins Hives    Objective: Temp: 98.5 F (36.9 C) (06/30 1543) Temp src: Oral (06/30 1543) BP: 181/139 mmHg (06/30 1543) Pulse Rate: 74 (06/30 1543)  General: He is talkative and  in no distress  Eyes: Chronic stable conjunctival injection bilaterally  Oral: No oropharyngeal lesions  Skin: No rash  Lungs: Clear  Cor: Regular S1 and S2 and no murmurs. Left anterior chest hemodialysis catheter mood and affect: Normal   Lab Results HIV 1 RNA Quant (copies/mL)  Date Value  09/17/2012 <20   02/11/2012 94*  03/31/2011 96*     CD4 T Cell Abs (cmm)  Date Value  09/17/2012 880   02/11/2012 580   03/31/2011 570      Assessment: He has all of his medication and it now appears to be getting back on track after a month of missing doses. I will repeat his lab work today and see him back in 4-6 weeks.   Plan: 1. Continue current antiretroviral medications 2. Check lab work today  3.  followup in 4-6 weeks   Cliffton [name], MD [facility] [phone] pager   [phone] cell 03/28/2013, 5:08 PM

## 2013-03-29 LAB — COMPREHENSIVE METABOLIC PANEL
Albumin: 3.5 g/dL (ref 3.5–5.2)
CO2: 27 mEq/L (ref 19–32)
Calcium: 7.7 mg/dL — ABNORMAL LOW (ref 8.4–10.5)
Chloride: 99 mEq/L (ref 96–112)
Glucose, Bld: 76 mg/dL (ref 70–99)
Potassium: 4.4 mEq/L (ref 3.5–5.3)
Sodium: 139 mEq/L (ref 135–145)
Total Bilirubin: 0.6 mg/dL (ref 0.3–1.2)
Total Protein: 7 g/dL (ref 6.0–8.3)

## 2013-03-29 LAB — LIPID PANEL
Cholesterol: 149 mg/dL (ref 0–200)
Triglycerides: 130 mg/dL (ref <150)

## 2013-03-29 LAB — HIV-1 RNA QUANT-NO REFLEX-BLD: HIV 1 RNA Quant: 707985 copies/mL — ABNORMAL HIGH (ref <20)

## 2013-03-29 LAB — T-HELPER CELL (CD4) - (RCID CLINIC ONLY): CD4 % Helper T Cell: 14 % — ABNORMAL LOW (ref 33–55)

## 2013-03-30 ENCOUNTER — Other Ambulatory Visit: Payer: Self-pay | Admitting: Internal Medicine

## 2013-03-30 DIAGNOSIS — B20 Human immunodeficiency virus [HIV] disease: Secondary | ICD-10-CM

## 2013-03-30 NOTE — Addendum Note (Signed)
Addended bySteva Colder on: 03/30/2013 11:19 AM   Modules accepted: Orders

## 2013-03-30 NOTE — Progress Notes (Signed)
Done.  Jennet Maduro, RN

## 2013-04-07 LAB — HIV-1 GENOTYPR PLUS

## 2013-04-13 ENCOUNTER — Encounter: Payer: Self-pay | Admitting: Vascular Surgery

## 2013-04-14 ENCOUNTER — Encounter (HOSPITAL_COMMUNITY): Payer: Self-pay | Admitting: Pharmacy Technician

## 2013-04-14 ENCOUNTER — Ambulatory Visit (INDEPENDENT_AMBULATORY_CARE_PROVIDER_SITE_OTHER): Payer: Medicare Other | Admitting: Vascular Surgery

## 2013-04-14 ENCOUNTER — Encounter: Payer: Self-pay | Admitting: Vascular Surgery

## 2013-04-14 VITALS — BP 179/61 | HR 70 | Resp 16 | Ht 71.0 in | Wt 160.0 lb

## 2013-04-14 DIAGNOSIS — N186 End stage renal disease: Secondary | ICD-10-CM

## 2013-04-14 NOTE — Progress Notes (Signed)
[facility] [place] HISTORY AND PHYSICAL   History of Present Illness:  Patient is a 47 y.o. year old male who presents for placement of a permanent hemodialysis access. The patient is right handed.  The patient is not currently on hemodialysis.  The cause of renal failure is thought to be secondary to multiple factors.  Other chronic medical problems include HIV, multiple prior failed access.  Past Medical History  Diagnosis Date  . HIV infection   . DJD (degenerative joint disease)     right knee  . Tertiary syphilis   . Genital herpes   . Anal condyloma   . Hypertension   . Family history of disseminated HSV infection   . Shortness of breath     "can happen at anytime" (03/04/2013)  . ESRD (end stage renal disease) on dialysis 01/02/2012    Gets diaysis at [facility] on [employer], TTS schedule.  Started dialysis in 2000.     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 Reclotted on 03/02/13 and IR placed L IJ tunneled HD catheter (a right external jugular tunneled HD cath was attempted first but did not function due to kinking across the clavicle). Pt was seen by [facility] on same admission 6/614 and said RUA AVF no longer usable, they ordered vein mapping and will see him as outpatient to set up next permanent access.       Past Surgical History  Procedure Laterality Date  . Av fistula placement, brachiocephalic Right 06/16/11  . Appendectomy    . Tonsillectomy    . Cholecystectomy    . Incision and drainage abscess      abdominal  . Av fistula placement Right 06/16/11     Social History History  Substance Use Topics  . Smoking status: Current Every Day Smoker -- 0.50 packs/day for 27 years    Types: Cigarettes  . Smokeless tobacco: Never Used  . Alcohol Use: Yes     Comment: 03/04/2013 "stopped drinking ~ 12 yr ago; never had problem w/it"    Family History Family History  Problem Relation Age of  Onset  . Diabetes Mother   . Arthritis Father     Gout- Foot    Allergies  Allergies  Allergen Reactions  . Contrast Media (Iodinated Diagnostic Agents) Hives and Itching  . Acetaminophen-Codeine Nausea And Vomiting  . Oxycodone-Acetaminophen Nausea And Vomiting  . Penicillins Hives     Current Outpatient Prescriptions  Medication Sig Dispense Refill  . atazanavir (REYATAZ) 200 MG capsule Take 200 mg by mouth daily with breakfast.      . Darunavir Ethanolate (PREZISTA) 800 MG tablet Take 1 tablet (800 mg total) by mouth daily with breakfast.  30 tablet  11  . fluticasone (FLONASE) 50 MCG/ACT nasal spray Place 2 sprays into the nose daily.  16 g  0  . ritonavir (NORVIR) 100 MG capsule Take 1 capsule (100 mg total) by mouth daily.  30 capsule  6  . tenofovir (VIREAD) 300 MG tablet Take 1 tablet (300 mg total) by mouth once a week. Tuesday and thursday  4 tablet  11  . valACYclovir (VALTREX) 500 MG tablet Take 500 mg by mouth daily as needed. As needed for herpes outbreaks.      . bacitracin ointment Apply topically 2 (two) times daily.  120 g  0   No current facility-administered medications for this visit.  ROS:   General:  No weight loss, Fever, chills  HEENT: No recent headaches, no nasal bleeding, no visual changes, no sore throat  Neurologic: No dizziness, blackouts, seizures. No recent symptoms of stroke or mini- stroke. No recent episodes of slurred speech, or temporary blindness.  Cardiac: No recent episodes of chest pain/pressure, no shortness of breath at rest.  No shortness of breath with exertion.  Denies history of atrial fibrillation or irregular heartbeat  Vascular: No history of rest pain in feet.  No history of claudication.  No history of non-healing ulcer, No history of DVT   Pulmonary: No home oxygen, no productive cough, no hemoptysis,  No asthma or wheezing  Musculoskeletal:  [ ]  Arthritis, [ ]  Low back pain,  [ ]  Joint pain  Hematologic:No history  of hypercoagulable state.  No history of easy bleeding.  No history of anemia  Gastrointestinal: No hematochezia or melena,  No gastroesophageal reflux, no trouble swallowing  Urinary: [ ]  chronic Kidney disease, [ ]  on HD - [ ]  MWF or [x ] TTHS, [ ]  Burning with urination, [ ]  Frequent urination, [ ]  Difficulty urinating;   Skin: No rashes  Psychological: No history of anxiety,  No history of depression   Physical Examination  Filed Vitals:   04/14/13 1501  BP: 179/61  Pulse: 70  Resp: 16  Height: 5\' 11"  (1.803 m)  Weight: 160 lb (72.576 kg)  SpO2: 100%    Body mass index is 22.33 kg/(m^2).  General:  Alert and oriented, no acute distress HEENT: Normal Neck: No bruit or JVD, left side dialysis catheter Pulmonary: Clear to auscultation bilaterally Cardiac: Regular Rate and Rhythm without murmur Gastrointestinal: Soft, non-tender, non-distended, no mass Skin: No rash Extremity Pulses:  2+ radial, brachial pulses bilaterally, pulsatile right upper arm fistula, occluded left upper arm fistula Musculoskeletal: No deformity or edema  Neurologic: Upper and lower extremity motor 5/5 and symmetric  DATA: Recent vein mapping for [facility] hospital reviewed. The basilic vein appears to be a 3 mm or more diameter left arm.   ASSESSMENT: Needs new hemodialysis access offered patient options of left basilic vein transposition fistula. Other option is revision of his right upper arm AV fistula possibly requiring PTFE. He has opted to stay in the right arm for now.  PLAN:  Revision right upper arm AV fistula July 23. Risks benefits possible complications procedure details including but not limited to decreased patency of PTFE material as required, infection bleeding, ischemic steal. He understands and agrees to proceed.  Fabienne Bruns, MD [facility] [place] Office: [phone] Pager: [phone]

## 2013-04-15 ENCOUNTER — Other Ambulatory Visit: Payer: Self-pay

## 2013-04-17 ENCOUNTER — Encounter (HOSPITAL_COMMUNITY): Payer: Self-pay | Admitting: Emergency Medicine

## 2013-04-17 ENCOUNTER — Emergency Department (HOSPITAL_COMMUNITY)
Admission: EM | Admit: 2013-04-17 | Discharge: 2013-04-17 | Disposition: A | Discharge status: Home / Self Care | Payer: Medicare Other | Attending: Emergency Medicine | Admitting: Emergency Medicine

## 2013-04-17 DIAGNOSIS — Z880 Allergy status to penicillin: Secondary | ICD-10-CM | POA: Insufficient documentation

## 2013-04-17 DIAGNOSIS — N186 End stage renal disease: Secondary | ICD-10-CM | POA: Insufficient documentation

## 2013-04-17 DIAGNOSIS — F17200 Nicotine dependence, unspecified, uncomplicated: Secondary | ICD-10-CM | POA: Insufficient documentation

## 2013-04-17 DIAGNOSIS — H9190 Unspecified hearing loss, unspecified ear: Secondary | ICD-10-CM | POA: Insufficient documentation

## 2013-04-17 DIAGNOSIS — H6120 Impacted cerumen, unspecified ear: Secondary | ICD-10-CM | POA: Insufficient documentation

## 2013-04-17 DIAGNOSIS — I120 Hypertensive chronic kidney disease with stage 5 chronic kidney disease or end stage renal disease: Secondary | ICD-10-CM | POA: Insufficient documentation

## 2013-04-17 DIAGNOSIS — H6123 Impacted cerumen, bilateral: Secondary | ICD-10-CM

## 2013-04-17 DIAGNOSIS — Z21 Asymptomatic human immunodeficiency virus [HIV] infection status: Secondary | ICD-10-CM | POA: Insufficient documentation

## 2013-04-17 DIAGNOSIS — Z79899 Other long term (current) drug therapy: Secondary | ICD-10-CM | POA: Insufficient documentation

## 2013-04-17 DIAGNOSIS — Z992 Dependence on renal dialysis: Secondary | ICD-10-CM | POA: Insufficient documentation

## 2013-04-17 DIAGNOSIS — Z8739 Personal history of other diseases of the musculoskeletal system and connective tissue: Secondary | ICD-10-CM | POA: Insufficient documentation

## 2013-04-17 DIAGNOSIS — Z8619 Personal history of other infectious and parasitic diseases: Secondary | ICD-10-CM | POA: Insufficient documentation

## 2013-04-17 DIAGNOSIS — Z86718 Personal history of other venous thrombosis and embolism: Secondary | ICD-10-CM | POA: Insufficient documentation

## 2013-04-17 MED ORDER — DOCUSATE SODIUM 50 MG/5ML PO LIQD
50.0000 mg | Freq: Once | ORAL | Status: AC
  Administered 2013-04-17: 50 mg via ORAL
  Filled 2013-04-17: qty 10

## 2013-04-17 NOTE — ED Provider Notes (Signed)
History  This chart was scribed for Caleb Sites, PA-C working with Candyce Churn, MD by Greggory Stallion, ED scribe. This patient was seen in room TR05C/TR05C and the patient's care was started at 10:20 PM.  CSN: [id number] Arrival date & time 04/17/13  2154   Chief Complaint  Patient presents with  . Otalgia   The history is provided by the patient. No language interpreter was used.    HPI Comments: ORREN PIETSCH is a 47 y.o. male who presents to the Emergency Department complaining of gradual onset, constant right otalgia that started 3 days ago. He states it is hard to hear out of his right ear sometimes. Pt states it feels like it is "clogged" and his hearing is "muffled." Pt states it feels like his ear wants to pop when he yawns but it doesn't. He states he tried OTC wax removal twice with no relief. No recent fevers, sweats, or chills.  Past Medical History  Diagnosis Date  . HIV infection   . DJD (degenerative joint disease)     right knee  . Tertiary syphilis   . Genital herpes   . Anal condyloma   . Hypertension   . Family history of disseminated HSV infection   . Shortness of breath     "can happen at anytime" (03/04/2013)  . ESRD (end stage renal disease) on dialysis 01/02/2012    Gets diaysis at [facility] on [employer], TTS schedule.  Started dialysis in 2000.     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 Reclotted on 03/02/13 and IR placed L IJ tunneled HD catheter (a right external jugular tunneled HD cath was attempted first but did not function due to kinking across the clavicle). Pt was seen by [facility] on same admission 6/614 and said RUA AVF no longer usable, they ordered vein mapping and will see him as outpatient to set up next permanent access.      Past Surgical History  Procedure Laterality Date  . Av fistula placement, brachiocephalic Right 06/16/11  . Appendectomy    . Tonsillectomy    .  Cholecystectomy    . Incision and drainage abscess      abdominal  . Av fistula placement Right 06/16/11   Family History  Problem Relation Age of Onset  . Diabetes Mother   . Arthritis Father     Gout- Foot   History  Substance Use Topics  . Smoking status: Current Every Day Smoker -- 0.50 packs/day for 27 years    Types: Cigarettes  . Smokeless tobacco: Never Used  . Alcohol Use: Yes     Comment: 03/04/2013 "stopped drinking ~ 12 yr ago; never had problem w/it"    Review of Systems  HENT: Positive for ear pain.   All other systems reviewed and are negative.    Allergies  Contrast media; Acetaminophen-codeine; Oxycodone-acetaminophen; and Penicillins  Home Medications   Current Outpatient Rx  Name  Route  Sig  Dispense  Refill  . acetaminophen (TYLENOL) 325 MG tablet   Oral   Take 650 mg by mouth 2 (two) times daily as needed for pain.         Marland Kitchen atazanavir (REYATAZ) 200 MG capsule   Oral   Take 200 mg by mouth every evening.          . Darunavir Ethanolate (PREZISTA) 800 MG tablet   Oral   Take 1 tablet (  800 mg total) by mouth daily with breakfast.   30 tablet   11   . fluticasone (FLONASE) 50 MCG/ACT nasal spray   Nasal   Place 2 sprays into the nose daily.   16 g   0   . lamivudine (EPIVIR-HBV) 5 MG/ML solution   Oral   Take 5 mg by mouth every evening.         . naproxen sodium (ANAPROX) 220 MG tablet   Oral   Take 440 mg by mouth daily as needed (pain).         . ritonavir (NORVIR) 100 MG capsule   Oral   Take 100 mg by mouth every evening.         Marland Kitchen tenofovir (VIREAD) 300 MG tablet   Oral   Take 300 mg by mouth once a week. On Sundays         . valACYclovir (VALTREX) 500 MG tablet   Oral   Take 500 mg by mouth daily as needed (herpes outbreaks).           BP 136/73  Pulse 63  Temp(Src) 97.4 F (36.3 C) (Oral)  Resp 16  Ht 5\' 11"  (1.803 m)  Wt 160 lb (72.576 kg)  BMI 22.33 kg/m2  SpO2 100%  Physical Exam  Nursing  note and vitals reviewed. Constitutional: He is oriented to person, place, and time. He appears well-developed and well-nourished. No distress.  HENT:  Head: Normocephalic and atraumatic.  Right Ear: No mastoid tenderness.  Left Ear: No mastoid tenderness.  Mouth/Throat: Oropharynx is clear and moist.  Bilaterally cerumen impaction  Eyes: Conjunctivae and EOM are normal. Pupils are equal, round, and reactive to light.  Neck: Normal range of motion. Neck supple.  Cardiovascular: Normal rate, regular rhythm and normal heart sounds.   Pulmonary/Chest: Effort normal and breath sounds normal. No respiratory distress.  Musculoskeletal: Normal range of motion.  Neurological: He is alert and oriented to person, place, and time.  Skin: Skin is warm and dry.  Psychiatric: He has a normal mood and affect.    ED Course  Procedures (including critical care time)  DIAGNOSTIC STUDIES: Oxygen Saturation is 100% on RA, normal by my interpretation.    COORDINATION OF CARE: 10:25 PM-Discussed treatment plan which includes irrigating ear with pt at bedside and pt agreed to plan.   Labs Reviewed - No data to display No results found.  1. Cerumen impaction, bilateral     MDM   Bilateral cerumen impaction.   Ears irrigated with good improvement of sx and at least partial visualization of both TMs.  FU with [facility] if needs this done again.  Discussed plan with pt, she agreed.  Return precautions advised.  I personally performed the services described in this documentation, which was scribed in my presence. The recorded information has been reviewed and is accurate.   Garlon Hatchet, PA-C 04/17/13 2346

## 2013-04-17 NOTE — ED Notes (Signed)
Pt presents with right ear pain and decreased hearing in right ear for the past 3 days.  Pt tried OTC wax removal twice at home without relief.

## 2013-04-18 ENCOUNTER — Encounter (HOSPITAL_COMMUNITY): Payer: Self-pay | Admitting: Surgery

## 2013-04-19 ENCOUNTER — Ambulatory Visit (HOSPITAL_BASED_OUTPATIENT_CLINIC_OR_DEPARTMENT_OTHER): Payer: Medicare Other | Admitting: Internal Medicine

## 2013-04-19 VITALS — BP 124/78 | HR 72 | Temp 99.2°F | Resp 18 | Wt 161.0 lb

## 2013-04-19 DIAGNOSIS — B20 Human immunodeficiency virus [HIV] disease: Secondary | ICD-10-CM

## 2013-04-19 DIAGNOSIS — H9201 Otalgia, right ear: Secondary | ICD-10-CM | POA: Insufficient documentation

## 2013-04-19 DIAGNOSIS — H9209 Otalgia, unspecified ear: Secondary | ICD-10-CM

## 2013-04-19 MED ORDER — VANCOMYCIN HCL 10 G IV SOLR
1500.0000 mg | INTRAVENOUS | Status: AC
  Administered 2013-04-20: 1500 mg via INTRAVENOUS
  Filled 2013-04-19: qty 1500

## 2013-04-19 NOTE — Progress Notes (Signed)
Patient ID: [name], male   DOB: Sep 04, 1966, 47 y.o.   MRN: [id number]  CC: right hearing loss  HPI: 47 year old male with past medical history of HIV/AIDS on HAART, multiple sexually transmitted diseases who presented to the clinic with ongoing right ear difficulty hearing and occasional pain. Patient reports no fever or chills. No blurry vision or dizziness or lightheadedness. He reports this problem going on for some time but really getting worse over past few weeks. No other complaints, no chest pain or shortness of breath or palpitations. No diarrhea or constipation.  Allergies  Allergen Reactions  . Contrast Media (Iodinated Diagnostic Agents) Hives and Itching  . Acetaminophen-Codeine Nausea And Vomiting  . Oxycodone-Acetaminophen Nausea And Vomiting  . Penicillins Hives   Past Medical History  Diagnosis Date  . HIV infection   . DJD (degenerative joint disease)     right knee  . Tertiary syphilis   . Genital herpes   . Anal condyloma   . Family history of disseminated HSV infection   . Shortness of breath     "can happen at anytime" (03/04/2013)  . ESRD (end stage renal disease) on dialysis 01/02/2012    Gets diaysis at [facility] on [employer], TTS schedule.  Started dialysis in 2000.     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 Reclotted on 03/02/13 and IR placed L IJ tunneled HD catheter (a right external jugular tunneled HD cath was attempted first but did not function due to kinking across the clavicle). Pt was seen by [facility] on same admission 6/614 and said RUA AVF no longer usable, they ordered vein mapping and will see him as outpatient to set up next permanent access.     . Hypertension   . Pneumonia     hx of  . Diarrhea     occurs because of dialysis  . Hemodialysis patient     Tuesday, Thursday, Saturday   Current Outpatient Prescriptions on File Prior to Visit  Medication Sig Dispense Refill   . acetaminophen (TYLENOL) 325 MG tablet Take 650 mg by mouth 2 (two) times daily as needed for pain.      Marland Kitchen atazanavir (REYATAZ) 200 MG capsule Take 200 mg by mouth every evening.       . Darunavir Ethanolate (PREZISTA) 800 MG tablet Take 1 tablet (800 mg total) by mouth daily with breakfast.  30 tablet  11  . fluticasone (FLONASE) 50 MCG/ACT nasal spray Place 2 sprays into the nose daily.  16 g  0  . lamivudine (EPIVIR-HBV) 5 MG/ML solution Take 5 mg by mouth every evening.      . naproxen sodium (ANAPROX) 220 MG tablet Take 440 mg by mouth daily as needed (pain).      . ritonavir (NORVIR) 100 MG capsule Take 100 mg by mouth every evening.      Marland Kitchen tenofovir (VIREAD) 300 MG tablet Take 300 mg by mouth once a week. On Sundays      . valACYclovir (VALTREX) 500 MG tablet Take 500 mg by mouth daily as needed (herpes outbreaks).        No current facility-administered medications on file prior to visit.   Family History  Problem Relation Age of Onset  . Diabetes Mother   . Arthritis Father     Gout- Foot   History   Social History  . Marital Status: Divorced    Spouse  Name: N/A    Number of Children: N/A  . Years of Education: N/A   Occupational History  . Not on file.   Social History Main Topics  . Smoking status: Current Every Day Smoker -- 0.50 packs/day for 27 years    Types: Cigarettes  . Smokeless tobacco: Never Used  . Alcohol Use: Yes     Comment: 03/04/2013 "stopped drinking ~ 12 yr ago; never had problem w/it"  . Drug Use: No  . Sexually Active: Not Currently     Comment: declined condoms   Other Topics Concern  . Not on file   Social History Narrative  . No narrative on file    Review of Systems  Constitutional: Negative for fever, chills, diaphoresis, activity change, appetite change and fatigue.  HENT: positive for ear pain and hearing loss, no ear discharge.   Eyes: Negative for pain, discharge, redness, itching and visual disturbance.  Respiratory: Negative  for cough, choking, chest tightness, shortness of breath, wheezing and stridor.   Cardiovascular: Negative for chest pain, palpitations and leg swelling.  Gastrointestinal: Negative for abdominal distention.  Genitourinary: Negative for dysuria, urgency, frequency, hematuria, flank pain, decreased urine volume, difficulty urinating and dyspareunia.  Musculoskeletal: Negative for back pain, joint swelling, arthralgias and gait problem.  Neurological: Negative for dizziness, tremors, seizures, syncope, facial asymmetry, speech difficulty, weakness, light-headedness, numbness and headaches.  Hematological: Negative for adenopathy. Does not bruise/bleed easily.  Psychiatric/Behavioral: Negative for hallucinations, behavioral problems, confusion, dysphoric mood, decreased concentration and agitation.    Objective:   Filed Vitals:   04/19/13 1212  BP: 124/78  Pulse: 72  Temp: 99.2 F (37.3 C)  Resp: 18    Physical Exam  Constitutional: Appears well-developed and well-nourished. No distress.  HENT: Normocephalic. Right ear slight redness and some tenderness on otoscopic exam; left ear WNL Neck: Normal ROM. Neck supple. No JVD. No tracheal deviation. No thyromegaly.  CVS: RRR, S1/S2 +, no murmurs, no gallops, no carotid bruit.  Pulmonary: Effort and breath sounds normal, no stridor, rhonchi, wheezes, rales.  Abdominal: Soft. BS +,  no distension, tenderness, rebound or guarding.  Musculoskeletal: Normal range of motion. No edema and no tenderness.  Lymphadenopathy: No lymphadenopathy noted, cervical, inguinal. Neuro: Alert. Normal reflexes, muscle tone coordination. No cranial nerve deficit. Skin: Skin is warm and dry. No rash noted. Not diaphoretic. No erythema. No pallor.  Psychiatric: Normal mood and affect. Behavior, judgment, thought content normal.   Lab Results  Component Value Date   WBC 5.8 03/28/2013   HGB 11.4* 03/28/2013   HCT 33.5* 03/28/2013   MCV 95.4 03/28/2013   PLT 94*  03/28/2013   Lab Results  Component Value Date   CREATININE 12.57* 03/28/2013   BUN 25* 03/28/2013   NA 139 03/28/2013   K 4.4 03/28/2013   CL 99 03/28/2013   CO2 27 03/28/2013    No results found for this basename: HGBA1C   Lipid Panel     Component Value Date/Time   CHOL 149 03/28/2013 1627   TRIG 130 03/28/2013 1627   HDL 35* 03/28/2013 1627   CHOLHDL 4.3 03/28/2013 1627   VLDL 26 03/28/2013 1627   LDLCALC 88 03/28/2013 1627       Assessment and plan:   Patient Active Problem List   Diagnosis Date Noted  . Right ear pain and hearing oss 04/19/2013    Priority: High - referral provided to ENT  . HIV DISEASE - on HAART 10/19/2006

## 2013-04-19 NOTE — Patient Instructions (Addendum)
Hearing Loss A hearing loss is sometimes called deafness. Hearing loss may be partial or total. CAUSES Hearing loss may be caused by:  Wax in the ear canal.  Infection of the ear canal.  Infection of the middle ear.  Trauma to the ear or surrounding area.  Fluid in the middle ear.  A hole in the eardrum (perforated eardrum).  Exposure to loud sounds or music.  Problems with the hearing nerve.  Certain medications. Hearing loss without wax, infection, or a history of injury may mean that the nerve is involved. Hearing loss with severe dizziness, nausea and vomiting or ringing in the ear may suggest a hearing nerve irritation or problems in the middle or inner ear. If hearing loss is untreated, there is a greater likelihood for residual or permanent hearing loss. DIAGNOSIS A hearing test (audiometry) assesses hearing loss. The audiometry test needs to be performed by a hearing specialist (audiologist). TREATMENT Treatment for recent onset of hearing loss may include:  Ear wax removal.  Medications that kill germs (antibiotics).  Cortisone medications.  Prompt follow up with the appropriate specialist. Return of hearing depends on the cause of your hearing loss, so proper medical follow-up is important. Some hearing loss may not be reversible, and a caregiver should discuss care and treatment options with you. SEEK MEDICAL CARE IF:   You have a severe headache, dizziness, or changes in vision.  You have new or increased weakness.  You develop repeated vomiting or other serious medical problems.  You have a fever. Document Released: 09/15/2005 Document Revised: 12/08/2011 Document Reviewed: 01/10/2010 [facility] Patient Information 2014 [place], [place]. Otalgia The most common reason for this in children is an infection of the middle ear. Pain from the middle ear is usually caused by a build-up of fluid and pressure behind the eardrum. Pain from an earache can be sharp,  dull, or burning. The pain may be temporary or constant. The middle ear is connected to the nasal passages by a short narrow tube called the Eustachian tube. The Eustachian tube allows fluid to drain out of the middle ear, and helps keep the pressure in your ear equalized. CAUSES  A cold or allergy can block the Eustachian tube with inflammation and the build-up of secretions. This is especially likely in small children, because their Eustachian tube is shorter and more horizontal. When the Eustachian tube closes, the normal flow of fluid from the middle ear is stopped. Fluid can accumulate and cause stuffiness, pain, hearing loss, and an ear infection if germs start growing in this area. SYMPTOMS  The symptoms of an ear infection may include fever, ear pain, fussiness, increased crying, and irritability. Many children will have temporary and minor hearing loss during and right after an ear infection. Permanent hearing loss is rare, but the risk increases the more infections a child has. Other causes of ear pain include retained water in the outer ear canal from swimming and bathing. Ear pain in adults is less likely to be from an ear infection. Ear pain may be referred from other locations. Referred pain may be from the joint between your jaw and the skull. It may also come from a tooth problem or problems in the neck. Other causes of ear pain include:  A foreign body in the ear.  Outer ear infection.  Sinus infections.  Impacted ear wax.  Ear injury.  Arthritis of the jaw or TMJ problems.  Middle ear infection.  Tooth infections.  Sore throat with pain to  the ears. DIAGNOSIS  Your caregiver can usually make the diagnosis by examining you. Sometimes other special studies, including x-rays and lab work may be necessary. TREATMENT   If antibiotics were prescribed, use them as directed and finish them even if you or your child's symptoms seem to be improved.  Sometimes PE tubes are needed  in children. These are little plastic tubes which are put into the eardrum during a simple surgical procedure. They allow fluid to drain easier and allow the pressure in the middle ear to equalize. This helps relieve the ear pain caused by pressure changes. HOME CARE INSTRUCTIONS   Only take over-the-counter or prescription medicines for pain, discomfort, or fever as directed by your caregiver. DO NOT GIVE CHILDREN ASPIRIN because of the association of Reye's Syndrome in children taking aspirin.  Use a cold pack applied to the outer ear for 15-20 minutes, 3-4 times per day or as needed may reduce pain. Do not apply ice directly to the skin. You may cause frost bite.  Over-the-counter ear drops used as directed may be effective. Your caregiver may sometimes prescribe ear drops.  Resting in an upright position may help reduce pressure in the middle ear and relieve pain.  Ear pain caused by rapidly descending from high altitudes can be relieved by swallowing or chewing gum. Allowing infants to suck on a bottle during airplane travel can help.  Do not smoke in the house or near children. If you are unable to quit smoking, smoke outside.  Control allergies. SEEK IMMEDIATE MEDICAL CARE IF:   You or your child are becoming sicker.  Pain or fever relief is not obtained with medicine.  You or your child's symptoms (pain, fever, or irritability) do not improve within 24 to 48 hours or as instructed.  Severe pain suddenly stops hurting. This may indicate a ruptured eardrum.  You or your children develop new problems such as severe headaches, stiff neck, difficulty swallowing, or swelling of the face or around the ear. Document Released: 05/02/2004 Document Revised: 12/08/2011 Document Reviewed: 09/06/2008 [facility] Patient Information 2014 [place], [place].

## 2013-04-19 NOTE — Progress Notes (Signed)
Patient was recently seen in the ED for not being able to hear out of his right ear Said the Ed flushed both ears but still cant hear out of ear

## 2013-04-20 ENCOUNTER — Ambulatory Visit (HOSPITAL_COMMUNITY)
Admission: RE | Admit: 2013-04-20 | Discharge: 2013-04-20 | Disposition: A | Discharge status: Home / Self Care | Payer: Medicare Other | Source: Ambulatory Visit | Attending: Vascular Surgery | Admitting: Vascular Surgery

## 2013-04-20 ENCOUNTER — Encounter (HOSPITAL_COMMUNITY)
Admission: RE | Disposition: A | Discharge status: Home / Self Care | Payer: Self-pay | Source: Ambulatory Visit | Attending: Vascular Surgery

## 2013-04-20 ENCOUNTER — Encounter (HOSPITAL_COMMUNITY): Payer: Self-pay | Admitting: Anesthesiology

## 2013-04-20 ENCOUNTER — Ambulatory Visit (HOSPITAL_COMMUNITY): Payer: Medicare Other | Admitting: Anesthesiology

## 2013-04-20 DIAGNOSIS — Z86718 Personal history of other venous thrombosis and embolism: Secondary | ICD-10-CM | POA: Insufficient documentation

## 2013-04-20 DIAGNOSIS — T82898A Other specified complication of vascular prosthetic devices, implants and grafts, initial encounter: Secondary | ICD-10-CM | POA: Insufficient documentation

## 2013-04-20 DIAGNOSIS — Z91041 Radiographic dye allergy status: Secondary | ICD-10-CM | POA: Insufficient documentation

## 2013-04-20 DIAGNOSIS — N186 End stage renal disease: Secondary | ICD-10-CM | POA: Insufficient documentation

## 2013-04-20 DIAGNOSIS — Z79899 Other long term (current) drug therapy: Secondary | ICD-10-CM | POA: Insufficient documentation

## 2013-04-20 DIAGNOSIS — Z880 Allergy status to penicillin: Secondary | ICD-10-CM | POA: Insufficient documentation

## 2013-04-20 DIAGNOSIS — M1710 Unilateral primary osteoarthritis, unspecified knee: Secondary | ICD-10-CM | POA: Insufficient documentation

## 2013-04-20 DIAGNOSIS — F17200 Nicotine dependence, unspecified, uncomplicated: Secondary | ICD-10-CM | POA: Insufficient documentation

## 2013-04-20 DIAGNOSIS — Z885 Allergy status to narcotic agent status: Secondary | ICD-10-CM | POA: Insufficient documentation

## 2013-04-20 DIAGNOSIS — Y832 Surgical operation with anastomosis, bypass or graft as the cause of abnormal reaction of the patient, or of later complication, without mention of misadventure at the time of the procedure: Secondary | ICD-10-CM | POA: Insufficient documentation

## 2013-04-20 DIAGNOSIS — Z992 Dependence on renal dialysis: Secondary | ICD-10-CM | POA: Insufficient documentation

## 2013-04-20 DIAGNOSIS — I120 Hypertensive chronic kidney disease with stage 5 chronic kidney disease or end stage renal disease: Secondary | ICD-10-CM | POA: Insufficient documentation

## 2013-04-20 DIAGNOSIS — Z21 Asymptomatic human immunodeficiency virus [HIV] infection status: Secondary | ICD-10-CM | POA: Insufficient documentation

## 2013-04-20 HISTORY — PX: REVISON OF ARTERIOVENOUS FISTULA: SHX6074

## 2013-04-20 HISTORY — DX: Diarrhea, unspecified: R19.7

## 2013-04-20 HISTORY — DX: Dependence on renal dialysis: Z99.2

## 2013-04-20 LAB — POCT I-STAT 4, (NA,K, GLUC, HGB,HCT)
HCT: 30 % — ABNORMAL LOW (ref 39.0–52.0)
Hemoglobin: 10.2 g/dL — ABNORMAL LOW (ref 13.0–17.0)

## 2013-04-20 SURGERY — REVISON OF ARTERIOVENOUS FISTULA
Anesthesia: General | Site: Arm Upper | Laterality: Right | Wound class: Clean

## 2013-04-20 MED ORDER — ONDANSETRON HCL 4 MG/2ML IJ SOLN
INTRAMUSCULAR | Status: DC | PRN
  Administered 2013-04-20: 4 mg via INTRAVENOUS

## 2013-04-20 MED ORDER — THROMBIN 20000 UNITS EX SOLR
CUTANEOUS | Status: AC
  Filled 2013-04-20: qty 20000

## 2013-04-20 MED ORDER — SODIUM CHLORIDE 0.9 % IR SOLN
Status: DC | PRN
  Administered 2013-04-20: 08:00:00

## 2013-04-20 MED ORDER — OXYCODONE HCL 5 MG/5ML PO SOLN: 5.0000 mg | Freq: Once | ORAL | Status: AC | PRN

## 2013-04-20 MED ORDER — OXYCODONE HCL 5 MG PO TABS
ORAL_TABLET | ORAL | Status: AC
  Filled 2013-04-20: qty 1

## 2013-04-20 MED ORDER — HYDROMORPHONE HCL PF 1 MG/ML IJ SOLN
INTRAMUSCULAR | Status: AC
  Filled 2013-04-20: qty 1

## 2013-04-20 MED ORDER — PROTAMINE SULFATE 10 MG/ML IV SOLN
INTRAVENOUS | Status: DC | PRN
  Administered 2013-04-20 (×5): 10 mg via INTRAVENOUS

## 2013-04-20 MED ORDER — SODIUM CHLORIDE 0.9 % IV SOLN: INTRAVENOUS | Status: DC

## 2013-04-20 MED ORDER — MIDAZOLAM HCL 5 MG/5ML IJ SOLN
INTRAMUSCULAR | Status: DC | PRN
  Administered 2013-04-20: 2 mg via INTRAVENOUS

## 2013-04-20 MED ORDER — PHENYLEPHRINE HCL 10 MG/ML IJ SOLN
INTRAMUSCULAR | Status: DC | PRN
  Administered 2013-04-20: 40 ug via INTRAVENOUS
  Administered 2013-04-20 (×4): 80 ug via INTRAVENOUS

## 2013-04-20 MED ORDER — TRAMADOL HCL 50 MG PO TABS: 50.0000 mg | ORAL_TABLET | Freq: Four times a day (QID) | ORAL | Status: DC | PRN

## 2013-04-20 MED ORDER — FENTANYL CITRATE 0.05 MG/ML IJ SOLN
INTRAMUSCULAR | Status: DC | PRN
  Administered 2013-04-20 (×5): 50 ug via INTRAVENOUS

## 2013-04-20 MED ORDER — 0.9 % SODIUM CHLORIDE (POUR BTL) OPTIME
TOPICAL | Status: DC | PRN
  Administered 2013-04-20: 1000 mL

## 2013-04-20 MED ORDER — EPHEDRINE SULFATE 50 MG/ML IJ SOLN
INTRAMUSCULAR | Status: DC | PRN
  Administered 2013-04-20: 10 mg via INTRAVENOUS

## 2013-04-20 MED ORDER — SODIUM CHLORIDE 0.9 % IV SOLN
INTRAVENOUS | Status: DC | PRN
  Administered 2013-04-20: 08:00:00 via INTRAVENOUS

## 2013-04-20 MED ORDER — HEPARIN SODIUM (PORCINE) 1000 UNIT/ML IJ SOLN
INTRAMUSCULAR | Status: DC | PRN
  Administered 2013-04-20: 5000 [IU] via INTRAVENOUS

## 2013-04-20 MED ORDER — ARTIFICIAL TEARS OP OINT
TOPICAL_OINTMENT | OPHTHALMIC | Status: DC | PRN
  Administered 2013-04-20: 1 via OPHTHALMIC

## 2013-04-20 MED ORDER — LIDOCAINE HCL (CARDIAC) 20 MG/ML IV SOLN
INTRAVENOUS | Status: DC | PRN
  Administered 2013-04-20: 100 mg via INTRAVENOUS

## 2013-04-20 MED ORDER — OXYCODONE HCL 5 MG PO TABS
5.0000 mg | ORAL_TABLET | Freq: Once | ORAL | Status: AC | PRN
  Administered 2013-04-20: 5 mg via ORAL

## 2013-04-20 MED ORDER — THROMBIN 20000 UNITS EX SOLR
CUTANEOUS | Status: DC | PRN
  Administered 2013-04-20: 10:00:00 via TOPICAL

## 2013-04-20 MED ORDER — ONDANSETRON HCL 4 MG/2ML IJ SOLN: 4.0000 mg | Freq: Once | INTRAMUSCULAR | Status: DC | PRN

## 2013-04-20 MED ORDER — VANCOMYCIN HCL IN DEXTROSE 1-5 GM/200ML-% IV SOLN: 1000.0000 mg | INTRAVENOUS | Status: DC

## 2013-04-20 MED ORDER — PROPOFOL 10 MG/ML IV BOLUS
INTRAVENOUS | Status: DC | PRN
  Administered 2013-04-20: 200 mg via INTRAVENOUS

## 2013-04-20 MED ORDER — HYDROMORPHONE HCL PF 1 MG/ML IJ SOLN
0.2500 mg | INTRAMUSCULAR | Status: DC | PRN
  Administered 2013-04-20 (×2): 0.5 mg via INTRAVENOUS

## 2013-04-20 MED ORDER — LIDOCAINE HCL (PF) 1 % IJ SOLN
INTRAMUSCULAR | Status: AC
  Filled 2013-04-20: qty 30

## 2013-04-20 SURGICAL SUPPLY — 42 items
ADH SKN CLS APL DERMABOND .7 (GAUZE/BANDAGES/DRESSINGS) ×1
CANISTER SUCTION 2500CC (MISCELLANEOUS) ×2 IMPLANT
CLIP TI MEDIUM 6 (CLIP) ×2 IMPLANT
CLIP TI WIDE RED SMALL 6 (CLIP) ×2 IMPLANT
CLOTH BEACON ORANGE TIMEOUT ST (SAFETY) ×2 IMPLANT
COVER PROBE W GEL 5X96 (DRAPES) ×2 IMPLANT
COVER SURGICAL LIGHT HANDLE (MISCELLANEOUS) ×2 IMPLANT
DECANTER SPIKE VIAL GLASS SM (MISCELLANEOUS) ×2 IMPLANT
DERMABOND ADVANCED (GAUZE/BANDAGES/DRESSINGS) ×1
DERMABOND ADVANCED .7 DNX12 (GAUZE/BANDAGES/DRESSINGS) ×1 IMPLANT
DRAIN PENROSE 1/4X12 LTX STRL (WOUND CARE) IMPLANT
ELECT REM PT RETURN 9FT ADLT (ELECTROSURGICAL) ×2
ELECTRODE REM PT RTRN 9FT ADLT (ELECTROSURGICAL) ×1 IMPLANT
GEL ULTRASOUND 20GR AQUASONIC (MISCELLANEOUS) IMPLANT
GLOVE BIO SURGEON STRL SZ 6.5 (GLOVE) ×1 IMPLANT
GLOVE BIO SURGEON STRL SZ7.5 (GLOVE) ×2 IMPLANT
GLOVE BIOGEL PI IND STRL 6.5 (GLOVE) IMPLANT
GLOVE BIOGEL PI IND STRL 7.0 (GLOVE) ×1 IMPLANT
GLOVE BIOGEL PI IND STRL 7.5 (GLOVE) ×1 IMPLANT
GLOVE BIOGEL PI INDICATOR 6.5 (GLOVE) ×1
GLOVE BIOGEL PI INDICATOR 7.0 (GLOVE) ×1
GLOVE BIOGEL PI INDICATOR 7.5 (GLOVE) ×1
GOWN PREVENTION PLUS XLARGE (GOWN DISPOSABLE) ×4 IMPLANT
GOWN STRL NON-REIN LRG LVL3 (GOWN DISPOSABLE) ×2 IMPLANT
GRAFT GORETEX 8X70 (Vascular Products) ×1 IMPLANT
KIT BASIN OR (CUSTOM PROCEDURE TRAY) ×2 IMPLANT
KIT ROOM TURNOVER OR (KITS) ×2 IMPLANT
LOOP VESSEL MINI RED (MISCELLANEOUS) IMPLANT
NS IRRIG 1000ML POUR BTL (IV SOLUTION) ×2 IMPLANT
PACK CV ACCESS (CUSTOM PROCEDURE TRAY) ×2 IMPLANT
PAD ARMBOARD 7.5X6 YLW CONV (MISCELLANEOUS) ×4 IMPLANT
SPONGE SURGIFOAM ABS GEL 100 (HEMOSTASIS) ×2 IMPLANT
SUT PROLENE 5 0 C 1 24 (SUTURE) ×1 IMPLANT
SUT PROLENE 6 0 CC (SUTURE) ×2 IMPLANT
SUT PROLENE 7 0 BV 1 (SUTURE) ×2 IMPLANT
SUT VIC AB 3-0 SH 27 (SUTURE) ×4
SUT VIC AB 3-0 SH 27X BRD (SUTURE) ×2 IMPLANT
SUT VICRYL 4-0 PS2 18IN ABS (SUTURE) ×4 IMPLANT
TOWEL OR 17X24 6PK STRL BLUE (TOWEL DISPOSABLE) ×3 IMPLANT
TOWEL OR 17X26 10 PK STRL BLUE (TOWEL DISPOSABLE) ×2 IMPLANT
UNDERPAD 30X30 INCONTINENT (UNDERPADS AND DIAPERS) ×2 IMPLANT
WATER STERILE IRR 1000ML POUR (IV SOLUTION) ×2 IMPLANT

## 2013-04-20 NOTE — H&P (View-Only) (Signed)
[facility] [place] HISTORY AND PHYSICAL   History of Present Illness:  Patient is a 47 y.o. year old male who presents for placement of a permanent hemodialysis access. The patient is right handed.  The patient is not currently on hemodialysis.  The cause of renal failure is thought to be secondary to multiple factors.  Other chronic medical problems include HIV, multiple prior failed access.  Past Medical History  Diagnosis Date  . HIV infection   . DJD (degenerative joint disease)     right knee  . Tertiary syphilis   . Genital herpes   . Anal condyloma   . Hypertension   . Family history of disseminated HSV infection   . Shortness of breath     "can happen at anytime" (03/04/2013)  . ESRD (end stage renal disease) on dialysis 01/02/2012    Gets diaysis at [facility] on [address], TTS schedule.  Started dialysis in 2000.     . 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 Reclotted on 03/02/13 and IR placed L IJ tunneled HD catheter (a right external jugular tunneled HD cath was attempted first but did not function due to kinking across the clavicle). Pt was seen by [facility] on same admission 6/614 and said RUA AVF no longer usable, they ordered vein mapping and will see him as outpatient to set up next permanent access.       Past Surgical History  Procedure Laterality Date  . Av fistula placement, brachiocephalic Right 06/16/11  . Appendectomy    . Tonsillectomy    . Cholecystectomy    . Incision and drainage abscess      abdominal  . Av fistula placement Right 06/16/11     Social History History  Substance Use Topics  . Smoking status: Current Every Day Smoker -- 0.50 packs/day for 27 years    Types: Cigarettes  . Smokeless tobacco: Never Used  . Alcohol Use: Yes     Comment: 03/04/2013 "stopped drinking ~ 12 yr ago; never had problem w/it"    Family History Family History  Problem Relation Age of  Onset  . Diabetes Mother   . Arthritis Father     Gout- Foot    Allergies  Allergies  Allergen Reactions  . Contrast Media (Iodinated Diagnostic Agents) Hives and Itching  . Acetaminophen-Codeine Nausea And Vomiting  . Oxycodone-Acetaminophen Nausea And Vomiting  . Penicillins Hives     Current Outpatient Prescriptions  Medication Sig Dispense Refill  . atazanavir (REYATAZ) 200 MG capsule Take 200 mg by mouth daily with breakfast.      . Darunavir Ethanolate (PREZISTA) 800 MG tablet Take 1 tablet (800 mg total) by mouth daily with breakfast.  30 tablet  11  . fluticasone (FLONASE) 50 MCG/ACT nasal spray Place 2 sprays into the nose daily.  16 g  0  . ritonavir (NORVIR) 100 MG capsule Take 1 capsule (100 mg total) by mouth daily.  30 capsule  6  . tenofovir (VIREAD) 300 MG tablet Take 1 tablet (300 mg total) by mouth once a week. Tuesday and thursday  4 tablet  11  . valACYclovir (VALTREX) 500 MG tablet Take 500 mg by mouth daily as needed. As needed for herpes outbreaks.      . bacitracin ointment Apply topically 2 (two) times daily.  120 g  0   No current facility-administered medications for this visit.      ROS:   General:  No weight loss, Fever, chills  HEENT: No recent headaches, no nasal bleeding, no visual changes, no sore throat  Neurologic: No dizziness, blackouts, seizures. No recent symptoms of stroke or mini- stroke. No recent episodes of slurred speech, or temporary blindness.  Cardiac: No recent episodes of chest pain/pressure, no shortness of breath at rest.  No shortness of breath with exertion.  Denies history of atrial fibrillation or irregular heartbeat  Vascular: No history of rest pain in feet.  No history of claudication.  No history of non-healing ulcer, No history of DVT   Pulmonary: No home oxygen, no productive cough, no hemoptysis,  No asthma or wheezing  Musculoskeletal:  [ ] Arthritis, [ ] Low back pain,  [ ] Joint pain  Hematologic:No history  of hypercoagulable state.  No history of easy bleeding.  No history of anemia  Gastrointestinal: No hematochezia or melena,  No gastroesophageal reflux, no trouble swallowing  Urinary: [ ] chronic Kidney disease, [ ] on HD - [ ] MWF or [x ] TTHS, [ ] Burning with urination, [ ] Frequent urination, [ ] Difficulty urinating;   Skin: No rashes  Psychological: No history of anxiety,  No history of depression   Physical Examination  Filed Vitals:   04/14/13 1501  BP: 179/61  Pulse: 70  Resp: 16  Height: 5' 11" (1.803 m)  Weight: 160 lb (72.576 kg)  SpO2: 100%    Body mass index is 22.33 kg/(m^2).  General:  Alert and oriented, no acute distress HEENT: Normal Neck: No bruit or JVD, left side dialysis catheter Pulmonary: Clear to auscultation bilaterally Cardiac: Regular Rate and Rhythm without murmur Gastrointestinal: Soft, non-tender, non-distended, no mass Skin: No rash Extremity Pulses:  2+ radial, brachial pulses bilaterally, pulsatile right upper arm fistula, occluded left upper arm fistula Musculoskeletal: No deformity or edema  Neurologic: Upper and lower extremity motor 5/5 and symmetric  DATA: Recent vein mapping for [facility] reviewed. The basilic vein appears to be a 3 mm or more diameter left arm.   ASSESSMENT: Needs new hemodialysis access offered patient options of left basilic vein transposition fistula. Other option is revision of his right upper arm AV fistula possibly requiring PTFE. He has opted to stay in the right arm for now.  PLAN:  Revision right upper arm AV fistula July 23. Risks benefits possible complications procedure details including but not limited to decreased patency of PTFE material as required, infection bleeding, ischemic steal. He understands and agrees to proceed.  Jamica Woodyard, MD [facility] [place] Office: [phone] Pager: [phone]  

## 2013-04-20 NOTE — Anesthesia Procedure Notes (Signed)
Procedure Name: LMA Insertion Date/Time: 04/20/2013 8:35 AM Performed by: Elizbeth Squires R Pre-anesthesia Checklist: Patient identified, Emergency Drugs available, Suction available and Patient being monitored Patient Re-evaluated:Patient Re-evaluated prior to inductionOxygen Delivery Method: Circle system utilized Preoxygenation: Pre-oxygenation with 100% oxygen Intubation Type: IV induction LMA: LMA inserted LMA Size: 5.0 Tube type: Oral Number of attempts: 1 Tube secured with: Tape Dental Injury: Teeth and Oropharynx as per pre-operative assessment

## 2013-04-20 NOTE — Op Note (Signed)
Procedure: Right Upper Arm AV graft  Preop: ESRD  Postop: ESRD  Anesthesia: General  Assistant: Ardyth Harps, PA-C  Findings:8 mm PTFE end to end to axillary vein   Procedure Details: After induction of general anesthesia, the right upper extremity was prepped and draped in usual sterile fashion.     A longitudinal incision was then made just above the antecubital crease the right arm over a pre-existing fistula.  The proximal 5 cm of the fistula was patent. The incision was carried into the subcutaneous tissues down to level cephalic vein. Cephalic vein was dissected free circumferentially.  The fistula was 10 mm in diameter.  Several centimeters of the fistula was dissected free. A vessel loop was placed around the fistula. At this point a longitudinal incision was made in this axilla and carried through the subcutaneous tissues and fascia to expose the axillary vein.  The nerves were protected.  The vein was approximately 5 mm in diameter.  It was dissected free and small side branches ligated between silk ties.  Next, a subcutaneous tunnel was created connecting the upper arm to the lower arm incision in an arcing configuration over the biceps muscle.  A an 8 mm tapered PTFE graft was then brought through this subcutaneous tunnel. The patient was given 5000 units of intravenous heparin. After appropriate circulation time, fistula clamps were used to control the fistula proximally and distally. The fistula was transected.  The end of the graft was sewn end to end to the proximal fistula using a 6 0 prolene.  At completion of the anastomosis it was forward bled, backbled and thoroughly flushed.  The old distal fistula was oversewn with a 5 0 prolene suture.  The anastomosis was secured, clamps were released and there was palpable pulse in the graft.  The graft was clamped just above the arterial anastomosis with a fistula clamp. The graft was then pulled taut to length at the axillary incision.  The  axillary vein was controlled with a fine bulldog clamp and the distal vein ligated with a 2 0 silk tie.  The vein was transected  Above the tie and spatulated.  The 8 mm end of the graft was then sewn end to end to the vein using a running 6 0 prolene.  Just prior to completion of the anastomosis, everything was forward bled, back bled and thoroughly flushed.  The anastomosis was secured and the fistula clamp removed from the proximal graft.  A thrill was immediately palpable in the graft and the patient had a palpable radial pulse. The patient was given 50 mg of protamine to assist with hemostasis.  After hemostasis was obtained, the subcutaneous tissues were reapproximated using a running 3-0 Vicryl suture. The skin was then closed with a 4 Vicryl subcuticular stitch. Dermabond was applied to the skin incisions.  The patient tolerated the procedure well and there were no complications.  Instrument sponge and needle count was correct at the end of the case.  The patient was taken to the recovery room in stable condition.  Fabienne Bruns, MD [facility] [place] Office: [phone] Pager: [phone]

## 2013-04-20 NOTE — Anesthesia Preprocedure Evaluation (Addendum)
Anesthesia Evaluation  Patient identified by MRN, date of birth, ID band Patient awake    Reviewed: Allergy & Precautions, H&P , NPO status , Patient's Chart, lab work & pertinent test results  Airway Mallampati: II TM Distance: >3 FB Neck ROM: Full    Dental  (+) Dental Advisory Given and Poor Dentition   Pulmonary  Pt denies any shortness of breath - only a problem if missed dialysis breath sounds clear to auscultation        Cardiovascular hypertension, Pt. on medications Rhythm:Regular Rate:Normal     Neuro/Psych PSYCHIATRIC DISORDERS Depression  Neuromuscular disease    GI/Hepatic   Endo/Other    Renal/GU Dialysis and ESRFRenal diseaseDialysis 7/22.     Musculoskeletal   Abdominal   Peds  Hematology  (+) HIV,   Anesthesia Other Findings   Reproductive/Obstetrics                         Anesthesia Physical Anesthesia Plan  ASA: III  Anesthesia Plan: General   Post-op Pain Management:    Induction: Intravenous  Airway Management Planned: LMA  Additional Equipment:   Intra-op Plan:   Post-operative Plan: Extubation in OR  Informed Consent: I have reviewed the patients History and Physical, chart, labs and discussed the procedure including the risks, benefits and alternatives for the proposed anesthesia with the patient or authorized representative who has indicated his/her understanding and acceptance.   Dental advisory given  Plan Discussed with: CRNA, Anesthesiologist and Surgeon  Anesthesia Plan Comments:         Anesthesia Quick Evaluation

## 2013-04-20 NOTE — ED Provider Notes (Signed)
Medical screening examination/treatment/procedure(s) were performed by non-physician practitioner and as supervising physician I was immediately available for consultation/collaboration.  Candyce Churn, MD 04/20/13 [phone]

## 2013-04-20 NOTE — Transfer of Care (Signed)
Immediate Anesthesia Transfer of Care Note  Patient: [name]  Procedure(s) Performed: Procedure(s) with comments: INSERTION OF ARTERIOVENOUS GORTEX GRAFT (Right) - Ultrasound Guided  Patient Location: PACU  Anesthesia Type:General  Level of Consciousness: awake, alert  and oriented  Airway & Oxygen Therapy: Patient Spontanous Breathing and Patient connected to nasal cannula oxygen  Post-op Assessment: Report given to PACU RN, Post -op Vital signs reviewed and stable and Patient moving all extremities  Post vital signs: Reviewed and stable  Complications: No apparent anesthesia complications and pt complains of left shoulder pain. Pt states this shoulder does bother him at home at times. It does wake him from sleep. He usually takes Aleve to ease pain.  Dilaudid IV given by PACU RN.

## 2013-04-20 NOTE — Interval H&P Note (Signed)
History and Physical Interval Note:  04/20/2013 8:19 AM  [name]  has presented today for surgery, with the diagnosis of Complications of right arm arteriovenous fistula End stage renal disease  The various methods of treatment have been discussed with the patient and family. After consideration of risks, benefits and other options for treatment, the patient has consented to  Procedure(s): REVISON OF RIGHT ARM ARTERIOVENOUS FISTULA ; POSSIBLE INSERTION OF ARTERIOVENOUS GORTEX GRAFT (Right) as a surgical intervention .  The patient's history has been reviewed, patient examined, no change in status, stable for surgery.  I have reviewed the patient's chart and labs.  Questions were answered to the patient's satisfaction.     Venisa Frampton E

## 2013-04-20 NOTE — Anesthesia Postprocedure Evaluation (Signed)
Anesthesia Post Note  Patient: [name]  Procedure(s) Performed: Procedure(s) (LRB): INSERTION OF ARTERIOVENOUS GORTEX GRAFT (Right)  Anesthesia type: general  Patient location: PACU  Post pain: Pain level controlled  Post assessment: Patient's Cardiovascular Status Stable  Last Vitals:  Filed Vitals:   04/20/13 1045  BP: 108/45  Pulse:   Temp: 36.7 C  Resp:     Post vital signs: Reviewed and stable  Level of consciousness: sedated  Complications: No apparent anesthesia complications

## 2013-04-21 ENCOUNTER — Encounter (HOSPITAL_COMMUNITY): Payer: Self-pay | Admitting: Vascular Surgery

## 2013-05-07 ENCOUNTER — Emergency Department (HOSPITAL_COMMUNITY): Payer: No Typology Code available for payment source

## 2013-05-07 ENCOUNTER — Emergency Department (HOSPITAL_COMMUNITY)
Admission: EM | Admit: 2013-05-07 | Discharge: 2013-05-07 | Disposition: A | Discharge status: Home / Self Care | Payer: No Typology Code available for payment source | Attending: Emergency Medicine | Admitting: Emergency Medicine

## 2013-05-07 ENCOUNTER — Encounter (HOSPITAL_COMMUNITY): Payer: Self-pay | Admitting: Emergency Medicine

## 2013-05-07 DIAGNOSIS — Z79899 Other long term (current) drug therapy: Secondary | ICD-10-CM | POA: Insufficient documentation

## 2013-05-07 DIAGNOSIS — S4980XA Other specified injuries of shoulder and upper arm, unspecified arm, initial encounter: Secondary | ICD-10-CM | POA: Insufficient documentation

## 2013-05-07 DIAGNOSIS — S139XXA Sprain of joints and ligaments of unspecified parts of neck, initial encounter: Secondary | ICD-10-CM | POA: Insufficient documentation

## 2013-05-07 DIAGNOSIS — Z8739 Personal history of other diseases of the musculoskeletal system and connective tissue: Secondary | ICD-10-CM | POA: Diagnosis not present

## 2013-05-07 DIAGNOSIS — I120 Hypertensive chronic kidney disease with stage 5 chronic kidney disease or end stage renal disease: Secondary | ICD-10-CM | POA: Insufficient documentation

## 2013-05-07 DIAGNOSIS — Y9389 Activity, other specified: Secondary | ICD-10-CM | POA: Diagnosis not present

## 2013-05-07 DIAGNOSIS — S134XXA Sprain of ligaments of cervical spine, initial encounter: Secondary | ICD-10-CM

## 2013-05-07 DIAGNOSIS — F17200 Nicotine dependence, unspecified, uncomplicated: Secondary | ICD-10-CM | POA: Insufficient documentation

## 2013-05-07 DIAGNOSIS — Z992 Dependence on renal dialysis: Secondary | ICD-10-CM | POA: Insufficient documentation

## 2013-05-07 DIAGNOSIS — S199XXA Unspecified injury of neck, initial encounter: Secondary | ICD-10-CM | POA: Diagnosis present

## 2013-05-07 DIAGNOSIS — Z21 Asymptomatic human immunodeficiency virus [HIV] infection status: Secondary | ICD-10-CM | POA: Insufficient documentation

## 2013-05-07 DIAGNOSIS — IMO0002 Reserved for concepts with insufficient information to code with codable children: Secondary | ICD-10-CM | POA: Insufficient documentation

## 2013-05-07 DIAGNOSIS — S0993XA Unspecified injury of face, initial encounter: Secondary | ICD-10-CM | POA: Diagnosis present

## 2013-05-07 DIAGNOSIS — Z8679 Personal history of other diseases of the circulatory system: Secondary | ICD-10-CM | POA: Insufficient documentation

## 2013-05-07 DIAGNOSIS — N186 End stage renal disease: Secondary | ICD-10-CM | POA: Insufficient documentation

## 2013-05-07 DIAGNOSIS — Z8619 Personal history of other infectious and parasitic diseases: Secondary | ICD-10-CM | POA: Insufficient documentation

## 2013-05-07 DIAGNOSIS — Z8701 Personal history of pneumonia (recurrent): Secondary | ICD-10-CM | POA: Insufficient documentation

## 2013-05-07 DIAGNOSIS — S46909A Unspecified injury of unspecified muscle, fascia and tendon at shoulder and upper arm level, unspecified arm, initial encounter: Secondary | ICD-10-CM | POA: Insufficient documentation

## 2013-05-07 DIAGNOSIS — Z880 Allergy status to penicillin: Secondary | ICD-10-CM | POA: Insufficient documentation

## 2013-05-07 DIAGNOSIS — Y92410 Unspecified street and highway as the place of occurrence of the external cause: Secondary | ICD-10-CM | POA: Diagnosis not present

## 2013-05-07 MED ORDER — HYDROCODONE-ACETAMINOPHEN 5-325 MG PO TABS: 1.0000 | ORAL_TABLET | ORAL | Status: DC | PRN

## 2013-05-07 MED ORDER — METHOCARBAMOL 500 MG PO TABS: 500.0000 mg | ORAL_TABLET | Freq: Two times a day (BID) | ORAL | Status: DC

## 2013-05-07 MED ORDER — HYDROCODONE-ACETAMINOPHEN 5-325 MG PO TABS
1.0000 | ORAL_TABLET | Freq: Once | ORAL | Status: AC
  Administered 2013-05-07: 1 via ORAL
  Filled 2013-05-07: qty 1
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
## 2013-05-07 NOTE — ED Provider Notes (Signed)
Medical screening examination/treatment/procedure(s) were performed by non-physician practitioner and as supervising physician I was immediately available for consultation/collaboration.  Deanza Upperman, MD 05/07/13 2002 

## 2013-05-09 ENCOUNTER — Ambulatory Visit (INDEPENDENT_AMBULATORY_CARE_PROVIDER_SITE_OTHER): Payer: Medicare Other | Admitting: Internal Medicine

## 2013-05-09 ENCOUNTER — Encounter: Payer: Self-pay | Admitting: *Deleted

## 2013-05-09 ENCOUNTER — Encounter: Payer: Self-pay | Admitting: Internal Medicine

## 2013-05-09 ENCOUNTER — Other Ambulatory Visit: Payer: Self-pay | Admitting: Internal Medicine

## 2013-05-09 VITALS — BP 175/92 | HR 68 | Temp 98.4°F | Ht 71.0 in | Wt 162.0 lb

## 2013-05-09 DIAGNOSIS — R197 Diarrhea, unspecified: Secondary | ICD-10-CM

## 2013-05-09 DIAGNOSIS — B20 Human immunodeficiency virus [HIV] disease: Secondary | ICD-10-CM

## 2013-05-09 NOTE — Progress Notes (Signed)
Pt receives annual immunizations at his dialysis center [facility] [phone]). He declined a pneumonia shot during today's visit with Dr. Orvan Falconer, will be offered one tomorrow (05/10/13) during dialysis. Per [facility]'s lab on 10/21/12, pt was immune to Hepatitis B, showing positive surface antibodies.  Andree Coss, RN
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
## 2013-05-11 ENCOUNTER — Other Ambulatory Visit: Payer: Self-pay | Admitting: Internal Medicine

## 2013-05-11 DIAGNOSIS — B20 Human immunodeficiency virus [HIV] disease: Secondary | ICD-10-CM

## 2013-05-11 LAB — HIV-1 RNA QUANT-NO REFLEX-BLD
HIV 1 RNA Quant: 129113 copies/mL — ABNORMAL HIGH (ref <20)
HIV-1 RNA Quant, Log: 5.11 {Log} — ABNORMAL HIGH (ref <1.30)

## 2013-05-12 ENCOUNTER — Other Ambulatory Visit: Payer: Medicare Other

## 2013-05-12 ENCOUNTER — Telehealth: Payer: Self-pay | Admitting: *Deleted

## 2013-05-12 DIAGNOSIS — B20 Human immunodeficiency virus [HIV] disease: Secondary | ICD-10-CM

## 2013-05-12 NOTE — Addendum Note (Signed)
Addended by: Mariea Clonts D on: 05/12/2013 11:06 AM   Modules accepted: Orders

## 2013-05-12 NOTE — Telephone Encounter (Signed)
Asked pt to return for additional labwork. Added a genotype to labs drawn 05/09/13, but will need more blood for the HLA [id number]. Pt may be able to come in this afternoon, otherwise it will be Monday. He will call and let us know. Andree Coss, RN

## 2013-05-18 ENCOUNTER — Encounter (HOSPITAL_COMMUNITY): Payer: Self-pay | Admitting: Emergency Medicine

## 2013-05-18 ENCOUNTER — Emergency Department (HOSPITAL_COMMUNITY)
Admission: EM | Admit: 2013-05-18 | Discharge: 2013-05-18 | Disposition: A | Discharge status: Home / Self Care | Payer: Medicare Other | Attending: Emergency Medicine | Admitting: Emergency Medicine

## 2013-05-18 DIAGNOSIS — J3489 Other specified disorders of nose and nasal sinuses: Secondary | ICD-10-CM | POA: Insufficient documentation

## 2013-05-18 DIAGNOSIS — Z21 Asymptomatic human immunodeficiency virus [HIV] infection status: Secondary | ICD-10-CM | POA: Insufficient documentation

## 2013-05-18 DIAGNOSIS — I120 Hypertensive chronic kidney disease with stage 5 chronic kidney disease or end stage renal disease: Secondary | ICD-10-CM | POA: Insufficient documentation

## 2013-05-18 DIAGNOSIS — R509 Fever, unspecified: Secondary | ICD-10-CM | POA: Insufficient documentation

## 2013-05-18 DIAGNOSIS — H9190 Unspecified hearing loss, unspecified ear: Secondary | ICD-10-CM | POA: Insufficient documentation

## 2013-05-18 DIAGNOSIS — Z8739 Personal history of other diseases of the musculoskeletal system and connective tissue: Secondary | ICD-10-CM | POA: Insufficient documentation

## 2013-05-18 DIAGNOSIS — J029 Acute pharyngitis, unspecified: Secondary | ICD-10-CM

## 2013-05-18 DIAGNOSIS — R059 Cough, unspecified: Secondary | ICD-10-CM | POA: Insufficient documentation

## 2013-05-18 DIAGNOSIS — Z79899 Other long term (current) drug therapy: Secondary | ICD-10-CM | POA: Insufficient documentation

## 2013-05-18 DIAGNOSIS — Z8619 Personal history of other infectious and parasitic diseases: Secondary | ICD-10-CM | POA: Insufficient documentation

## 2013-05-18 DIAGNOSIS — Z880 Allergy status to penicillin: Secondary | ICD-10-CM | POA: Insufficient documentation

## 2013-05-18 DIAGNOSIS — F17200 Nicotine dependence, unspecified, uncomplicated: Secondary | ICD-10-CM | POA: Insufficient documentation

## 2013-05-18 DIAGNOSIS — Z8701 Personal history of pneumonia (recurrent): Secondary | ICD-10-CM | POA: Insufficient documentation

## 2013-05-18 DIAGNOSIS — N186 End stage renal disease: Secondary | ICD-10-CM | POA: Insufficient documentation

## 2013-05-18 DIAGNOSIS — Z992 Dependence on renal dialysis: Secondary | ICD-10-CM | POA: Insufficient documentation

## 2013-05-18 DIAGNOSIS — R05 Cough: Secondary | ICD-10-CM

## 2013-05-18 DIAGNOSIS — Z8679 Personal history of other diseases of the circulatory system: Secondary | ICD-10-CM | POA: Insufficient documentation

## 2013-05-18 MED ORDER — HYDROCODONE-ACETAMINOPHEN 7.5-325 MG/15ML PO SOLN: 10.0000 mL | Freq: Three times a day (TID) | ORAL | Status: DC | PRN

## 2013-05-18 NOTE — ED Notes (Signed)
PT. REPORTS SORE THROAT WITH OCCASIONAL PRODUCTIVE COUGH FOR SEVERAL DAYS , DENEIS FEVER OR CHILLS, HEMODIALYSIS TUES/ THURS/ SAT .

## 2013-05-18 NOTE — ED Provider Notes (Signed)
CSN: [id number]     Arrival date & time 05/18/13  2113 History    This chart was scribed for a non-physician practitioner, Antony Madura, PA-C, working with Enid Skeens, MD by Frederik Pear, ED Scribe. This patient was seen in room TR07C/TR07C and the patient's care was started at 2213.   First MD Initiated Contact with Patient 05/18/13 2213     Chief Complaint  Patient presents with  . Sore Throat   (Consider location/radiation/quality/duration/timing/severity/associated sxs/prior Treatment) Patient is a 47 y.o. male presenting with pharyngitis. The history is provided by the patient and medical records. No language interpreter was used.  Sore Throat This is a new problem. The current episode started more than 1 week ago. The problem occurs constantly. The problem has not changed since onset.Associated symptoms include shortness of breath. The symptoms are aggravated by swallowing. Treatments tried: robitussin. The treatment provided moderate relief.    HPI Comments: [name] is a 47 y.o. male with a h/o of HIVwho presents to the Emergency Department complaining of a constant sore throat with an intermittent nonproductive cough that has persisted for 1.5 weeks. He also complains of nasal congestion, decreased hearing, and SOB that occurs when he is laying down. He denies chest congestion, rhinorrhea, or inability to swallow. He has treated the symptoms with Halls cough drops, Flonase, robitussin with some improvement, but he finished the bottle. He experience some intermittent relief from treatment. He reports he is currently compliant with treatment for HIV and is currently taking retroviral medications. His last viral load was 70, and he is unsure of his most recent CD4 count.  PCP is Dr. Dewayne Shorter.   Past Medical History  Diagnosis Date  . HIV infection   . DJD (degenerative joint disease)     right knee  . Tertiary syphilis   . Genital herpes   . Anal condyloma    . Family history of disseminated HSV infection   . Shortness of breath     "can happen at anytime" (03/04/2013)  . ESRD (end stage renal disease) on dialysis 01/02/2012    Gets diaysis at [facility] on [employer], TTS schedule.  Started dialysis in 2000.     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 Reclotted on 03/02/13 and IR placed L IJ tunneled HD catheter (a right external jugular tunneled HD cath was attempted first but did not function due to kinking across the clavicle). Pt was seen by [facility] on same admission 6/614 and said RUA AVF no longer usable, they ordered vein mapping and will see him as outpatient to set up next permanent access.     . Hypertension   . Pneumonia     hx of  . Diarrhea     occurs because of dialysis  . Hemodialysis patient     Tuesday, Thursday, Saturday   Past Surgical History  Procedure Laterality Date  . Av fistula placement, brachiocephalic Right 06/16/11  . Appendectomy    . Tonsillectomy    . Cholecystectomy    . Incision and drainage abscess      abdominal  . Av fistula placement Right 06/16/11  . Revison of arteriovenous fistula Right 04/20/2013    Procedure: INSERTION OF ARTERIOVENOUS GORTEX GRAFT;  Surgeon: Sherren Kerns, MD;  Location: [facility] OR;  Service: Vascular;  Laterality: Right;  Ultrasound Guided   Family History  Problem Relation Age of Onset  .  Diabetes Mother   . Arthritis Father     Gout- Foot   History  Substance Use Topics  . Smoking status: Current Every Day Smoker -- 1.00 packs/day for 27 years    Types: Cigarettes  . Smokeless tobacco: Never Used  . Alcohol Use: Yes     Comment: 03/04/2013 "stopped drinking ~ 12 yr ago; never had problem w/it"    Review of Systems  HENT: Positive for hearing loss, congestion and sore throat.   Respiratory: Positive for cough and shortness of breath.   All other systems reviewed and are negative.   Allergies  Contrast media;  Acetaminophen-codeine; Oxycodone-acetaminophen; and Penicillins  Home Medications   Current Outpatient Rx  Name  Route  Sig  Dispense  Refill  . Darunavir Ethanolate (PREZISTA) 800 MG tablet   Oral   Take 800 mg by mouth daily with breakfast.         . fluticasone (FLONASE) 50 MCG/ACT nasal spray   Nasal   Place 2 sprays into the nose daily as needed for rhinitis.         Marland Kitchen HYDROcodone-acetaminophen (NORCO/VICODIN) 5-325 MG per tablet   Oral   Take 1-2 tablets by mouth every 4 (four) hours as needed for pain.   20 tablet   0   . lamivudine (EPIVIR-HBV) 5 MG/ML solution   Oral   Take 5 mg by mouth every evening.         . Menthol (HALLS COUGH DROPS MT)   Mouth/Throat   Use as directed 1 tablet in the mouth or throat daily as needed (cough).         . methocarbamol (ROBAXIN) 500 MG tablet   Oral   Take 1 tablet (500 mg total) by mouth 2 (two) times daily.   20 tablet   0   . ritonavir (NORVIR) 100 MG capsule   Oral   Take 100 mg by mouth every evening.         Marland Kitchen tenofovir (VIREAD) 300 MG tablet   Oral   Take 300 mg by mouth once a week. On Sundays         . traMADol (ULTRAM) 50 MG tablet   Oral   Take 50 mg by mouth every 6 (six) hours as needed for pain.         . valACYclovir (VALTREX) 500 MG tablet   Oral   Take 500 mg by mouth daily as needed (herpes outbreaks).          Marland Kitchen HYDROcodone-acetaminophen (HYCET) 7.5-325 mg/15 ml solution   Oral   Take 10 mLs by mouth every 8 (eight) hours as needed for pain. For sore throat   120 mL   0    BP 129/84  Pulse 74  Temp(Src) 98.3 F (36.8 C) (Oral)  Resp 14  SpO2 100% Physical Exam  Nursing note and vitals reviewed. Constitutional: He is oriented to person, place, and time. He appears well-developed and well-nourished. No distress.  HENT:  Head: Normocephalic and atraumatic.  Right Ear: External ear and ear canal normal. No mastoid tenderness.  Left Ear: Tympanic membrane, external ear and  ear canal normal. No mastoid tenderness.  Nose: Nose normal.  Mouth/Throat: Uvula is midline and mucous membranes are normal. No trismus in the jaw. No edematous. Posterior oropharyngeal erythema present. No oropharyngeal exudate, posterior oropharyngeal edema or tonsillar abscesses.  Right TM is slightly dull. Airway patent.  Eyes: Conjunctivae and EOM are normal. Pupils are equal, round, and  reactive to light. No scleral icterus.  Neck: Normal range of motion. Neck supple.  Cardiovascular: Normal rate, regular rhythm and intact distal pulses.  Exam reveals no gallop and no friction rub.   No murmur heard. Pulmonary/Chest: Effort normal. No respiratory distress. He has no wheezes. He has no rales. He exhibits no tenderness.  Musculoskeletal: Normal range of motion.  Lymphadenopathy:    He has cervical adenopathy.  Neurological: He is alert and oriented to person, place, and time.  Skin: Skin is warm and dry. No rash noted. He is not diaphoretic. No erythema. No pallor.  Psychiatric: He has a normal mood and affect. His behavior is normal.   ED Course   Procedures (including critical care time)  DIAGNOSTIC STUDIES: Oxygen Saturation is 100% on room air, normal by my interpretation.    COORDINATION OF CARE:  22:42- Discussed planned course of treatment in the ED with the patient, including a consult with the supervising physician, who is agreeable at this time.  Results for orders placed during the hospital encounter of 05/18/13  RAPID STREP SCREEN      Result Value Range   Streptococcus, Group A Screen (Direct) NEGATIVE  NEGATIVE  CULTURE, GROUP A STREP      Result Value Range   Specimen Description THROAT     Special Requests NONE     Culture       Value: No Beta Hemolytic Streptococci Isolated     Performed at [facility]   Report Status 05/20/2013 FINAL     Labs Reviewed  RAPID STREP SCREEN  CULTURE, GROUP A STREP   No results found.  1. Viral pharyngitis    2. Cough    MDM  Uncomplicated viral pharyngitis - patient well and nontoxic appearing and hemodynamically stable; afebrile. Airway patent and patient tolerating secretions without difficulty. There is no trismus or stridor. Uvula midline without evidence of peritonsillar abscess. Patient appropriate for discharge with instructions for symptomatic management. Hycet prescribed for sore throat and follow up with PCP advice. Indications for ED return discussed and patient agreeable to plan with no unaddressed concerns.  I personally performed the services described in this documentation, which was scribed in my presence. The recorded information has been reviewed and is accurate.    Antony Madura, PA-C 05/21/13 1439

## 2013-05-19 LAB — HLA B*5701: HLA-B*5701: NEGATIVE

## 2013-05-20 LAB — CULTURE, GROUP A STREP

## 2013-05-22 NOTE — ED Provider Notes (Signed)
Medical screening examination/treatment/procedure(s) were performed by non-physician practitioner and as supervising physician I was immediately available for consultation/collaboration.   Jaceion Aday M Gertrude Bucks, MD 05/22/13 0747 

## 2013-06-01 ENCOUNTER — Encounter: Payer: Self-pay | Admitting: Vascular Surgery

## 2013-06-02 ENCOUNTER — Ambulatory Visit (INDEPENDENT_AMBULATORY_CARE_PROVIDER_SITE_OTHER): Payer: Self-pay | Admitting: Vascular Surgery

## 2013-06-02 ENCOUNTER — Encounter: Payer: Self-pay | Admitting: Vascular Surgery

## 2013-06-02 VITALS — BP 148/82 | HR 74 | Ht 71.0 in | Wt 156.5 lb

## 2013-06-02 DIAGNOSIS — N186 End stage renal disease: Secondary | ICD-10-CM

## 2013-06-02 NOTE — Progress Notes (Signed)
Patient is a 47 year old male who is status post conversion of a right upper arm fistula to right upper arm graft on July 23. He is currently dialyzing via a catheter. He has no numbness or tingling in her right hand.  Physical exam:  Filed Vitals:   06/02/13 1613  BP: 148/82  Pulse: 74  Height: 5\' 11"  (1.803 m)  Weight: 156 lb 8 oz (70.988 kg)  SpO2: 100%   Right upper extremity: Well-healed incisions audible bruit palpable thrill in graft  Assessment: Patent right upper arm graft ready for cannulation at this point  Plan: Followup as needed  Fabienne Bruns, MD [facility] Office: [phone] Pager: [phone]

## 2013-06-09 ENCOUNTER — Encounter: Payer: Self-pay | Admitting: Internal Medicine

## 2013-06-09 ENCOUNTER — Ambulatory Visit (INDEPENDENT_AMBULATORY_CARE_PROVIDER_SITE_OTHER): Payer: Medicare Other | Admitting: Internal Medicine

## 2013-06-09 VITALS — BP 154/87 | HR 75 | Temp 98.0°F | Ht 71.0 in | Wt 157.5 lb

## 2013-06-09 DIAGNOSIS — B20 Human immunodeficiency virus [HIV] disease: Secondary | ICD-10-CM

## 2013-06-09 LAB — CBC
HCT: 30.9 % — ABNORMAL LOW (ref 39.0–52.0)
Hemoglobin: 10.3 g/dL — ABNORMAL LOW (ref 13.0–17.0)
MCH: 31.1 pg (ref 26.0–34.0)
MCHC: 33.3 g/dL (ref 30.0–36.0)
MCV: 93.4 fL (ref 78.0–100.0)

## 2013-06-09 LAB — COMPREHENSIVE METABOLIC PANEL
Albumin: 3.6 g/dL (ref 3.5–5.2)
Alkaline Phosphatase: 176 U/L — ABNORMAL HIGH (ref 39–117)
BUN: 9 mg/dL (ref 6–23)
Creat: 4.65 mg/dL — ABNORMAL HIGH (ref 0.50–1.35)
Glucose, Bld: 89 mg/dL (ref 70–99)
Total Bilirubin: 0.6 mg/dL (ref 0.3–1.2)

## 2013-06-09 NOTE — Progress Notes (Signed)
Patient ID: [name], male   DOB: 1966/03/09, 47 y.o.   MRN: [id number]          [facility] for Infectious Disease  Patient Active Problem List   Diagnosis Date Noted  . ESRD (end stage renal disease) on dialysis 01/02/2012    Priority: High  . HIV DISEASE 10/19/2006    Priority: High  . CIGARETTE SMOKER 10/19/2006    Priority: High  . Diarrhea 05/09/2013  . Right ear pain 04/19/2013  . 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 PERIPHERAL NEUROPATHY 04/10/2009  . BACTEREMIA, MYCOBACTERIUM AVIUM COMPLEX 10/19/2006  . HERPES, GENITAL NEC 10/19/2006  . WARTS, OTHER SPECIFIED VIRAL 10/19/2006  . SYPHILIS NOS 10/19/2006  . DEPRESSION 10/19/2006    Patient's Medications  New Prescriptions   No medications on file  Previous Medications   DARUNAVIR ETHANOLATE (PREZISTA) 800 MG TABLET    Take 800 mg by mouth daily with breakfast.   FLUTICASONE (FLONASE) 50 MCG/ACT NASAL SPRAY    Place 2 sprays into the nose daily as needed for rhinitis.   IBUPROFEN (ADVIL,MOTRIN) 200 MG TABLET    Take 200 mg by mouth every 6 (six) hours as needed for pain.   LAMIVUDINE (EPIVIR-HBV) 5 MG/ML SOLUTION    Take 5 mg by mouth every evening.   RITONAVIR (NORVIR) 100 MG CAPSULE    Take 100 mg by mouth every evening.   TENOFOVIR (VIREAD) 300 MG TABLET    Take 300 mg by mouth once a week. On Sundays   VALACYCLOVIR (VALTREX) 500 MG TABLET    Take 500 mg by mouth daily as needed (herpes outbreaks).   Modified Medications   No medications on file  Discontinued Medications   HYDROCODONE-ACETAMINOPHEN (HYCET) 7.5-325 MG/15 ML SOLUTION    Take 10 mLs by mouth every 8 (eight) hours as needed for pain. For sore throat   HYDROCODONE-ACETAMINOPHEN (NORCO/VICODIN) 5-325 MG PER TABLET    Take 1-2 tablets by mouth every 4 (four) hours as needed for pain.   MENTHOL (HALLS COUGH DROPS MT)    Use as directed 1 tablet in the mouth or throat daily as needed (cough).   METHOCARBAMOL (ROBAXIN) 500 MG  TABLET    Take 1 tablet (500 mg total) by mouth 2 (two) times daily.   TRAMADOL (ULTRAM) 50 MG TABLET    Take 50 mg by mouth every 6 (six) hours as needed for pain.    Subjective: [name] is in for his routine followup visit. He states that he is filling out his pillbox every Wednesday and denies missing any doses of his HIV medications. He has not had any problems obtaining her tolerating them. However, it appears that he is taking his Prezista each morning but taking his Norvir in the evening along with his Epivir. He is taking Viread once weekly. Since his last visit his fiance was diagnosed with Bell's palsy and has been out of work. This is putting stress on [name] as he is now caring for her in managing her finances. Review of Systems: Pertinent items are noted in HPI.  Past Medical History  Diagnosis Date  . HIV infection   . DJD (degenerative joint disease)     right knee  . Tertiary syphilis   . Genital herpes   . Anal condyloma   . Family history of disseminated HSV infection   . Shortness of breath     "can happen at anytime" (03/04/2013)  . ESRD (end stage renal disease) on dialysis  01/02/2012    Gets diaysis at [facility] on [employer], TTS schedule.  Started dialysis in 2000.     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 Reclotted on 03/02/13 and IR placed L IJ tunneled HD catheter (a right external jugular tunneled HD cath was attempted first but did not function due to kinking across the clavicle). Pt was seen by [facility] on same admission 6/614 and said RUA AVF no longer usable, they ordered vein mapping and will see him as outpatient to set up next permanent access.     . Hypertension   . Pneumonia     hx of  . Diarrhea     occurs because of dialysis  . Hemodialysis patient     Tuesday, Thursday, Saturday    History  Substance Use Topics  . Smoking status: Current Every Day Smoker -- 0.25 packs/day for 27  years    Types: Cigarettes  . Smokeless tobacco: Never Used     Comment: cutting back from last visit  . Alcohol Use: Yes     Comment: 03/04/2013 "stopped drinking ~ 12 yr ago; never had problem w/it"    Family History  Problem Relation Age of Onset  . Diabetes Mother   . Arthritis Father     Gout- Foot    Allergies  Allergen Reactions  . Contrast Media [Iodinated Diagnostic Agents] Hives and Itching  . Acetaminophen-Codeine Nausea And Vomiting  . Oxycodone-Acetaminophen Nausea And Vomiting  . Penicillins Hives    Objective: Temp: 98 F (36.7 C) (09/11 1126) Temp src: Oral (09/11 1126) BP: 154/87 mmHg (09/11 1126) Pulse Rate: 75 (09/11 1126)  General: He is in good spirits Oral: No oropharyngeal lesions Skin: Left anterior chest hemodialysis catheter site normal. Right upper arm graft normal Lungs: Clear Cor: Regular S1 and S2 no murmurs Mood and affect: Normal  Lab Results HIV 1 RNA Quant (copies/mL)  Date Value  05/09/2013 [id number]*  03/28/2013 [id number]*  09/17/2012 <20      CD4 T Cell Abs (cmm)  Date Value  05/09/2013 270*  03/28/2013 470   09/17/2012 880      Assessment: His are load was headed down at his visit one month ago but was still higher than I would expect on his current regimen. He said that he was not missing doses at that time. The genotype resistance test did not reveal any significant mutations. I will repeat his CD4 and viral load today. I will see him back in 5 days to review his lab work. I've asked him to bring all of his medications and pillbox in for review.  Plan: 1. Continue current antiretroviral regimen for now but have him take Norvir in the morning with his Prezista 2. Check CD4 count, HIV viral load, CBC and complete metabolic panel 3. Followup in 5 days   Caleb [name], MD [name] Disease [facility] [phone] pager   [phone] cell 06/09/2013, 11:52 AM

## 2013-06-10 LAB — T-HELPER CELL (CD4) - (RCID CLINIC ONLY): CD4 % Helper T Cell: 29 % — ABNORMAL LOW (ref 33–55)

## 2013-06-14 ENCOUNTER — Encounter: Payer: Self-pay | Admitting: Internal Medicine

## 2013-06-14 ENCOUNTER — Ambulatory Visit (INDEPENDENT_AMBULATORY_CARE_PROVIDER_SITE_OTHER): Payer: Medicare Other | Admitting: Internal Medicine

## 2013-06-14 VITALS — BP 161/51 | HR 70 | Temp 98.1°F | Ht 71.0 in | Wt 157.5 lb

## 2013-06-14 DIAGNOSIS — B20 Human immunodeficiency virus [HIV] disease: Secondary | ICD-10-CM

## 2013-06-14 NOTE — Progress Notes (Signed)
[facility] - Pharmacist    HPI: [name] is a 47 y.o. male here for follow-up of medication adherence.  Allergies: Allergies  Allergen Reactions  . Contrast Media [Iodinated Diagnostic Agents] Hives and Itching  . Acetaminophen-Codeine Nausea And Vomiting  . Oxycodone-Acetaminophen Nausea And Vomiting  . Penicillins Hives    Vitals: Temp: 98.1 F (36.7 C) (09/16 1125) Temp src: Oral (09/16 1125) BP: 161/51 mmHg (09/16 1125) Pulse Rate: 70 (09/16 1125)  Past Medical History: Past Medical History  Diagnosis Date  . HIV infection   . DJD (degenerative joint disease)     right knee  . Tertiary syphilis   . Genital herpes   . Anal condyloma   . Family history of disseminated HSV infection   . Shortness of breath     "can happen at anytime" (03/04/2013)  . ESRD (end stage renal disease) on dialysis 01/02/2012    Gets diaysis at [facility] on [employer], TTS schedule.  Started dialysis in 2000.     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 Reclotted on 03/02/13 and IR placed L IJ tunneled HD catheter (a right external jugular tunneled HD cath was attempted first but did not function due to kinking across the clavicle). Pt was seen by [facility] on same admission 6/614 and said RUA AVF no longer usable, they ordered vein mapping and will see him as outpatient to set up next permanent access.     . Hypertension   . Pneumonia     hx of  . Diarrhea     occurs because of dialysis  . Hemodialysis patient     Tuesday, Thursday, Saturday    Social History: History   Social History  . Marital Status: Divorced    Spouse Name: N/A    Number of Children: N/A  . Years of Education: N/A   Social History Main Topics  . Smoking status: Current Every Day Smoker -- 0.25 packs/day for 27 years    Types: Cigarettes  . Smokeless tobacco: Never Used     Comment: cutting back from last visit    . Alcohol Use: Yes     Comment: 03/04/2013 "stopped drinking ~ 12 yr ago; never had problem w/it"  . Drug Use: No  . Sexual Activity: Not Currently    Partners: Female     Comment: declined condoms   Other Topics Concern  . None   Social History Narrative  . None    Current Regimen: Darunavir/ritonavir, Lamivudine, Tenofovir  Labs: HIV 1 RNA Quant (copies/mL)  Date Value  06/09/2013 2440*  05/09/2013 [id number]*  03/28/2013 147829*     CD4 T Cell Abs (/uL)  Date Value  06/09/2013 560   05/09/2013 270*  03/28/2013 470      Hep B S Ab (no units)  Date Value  11/23/2006 No      Hepatitis B Surface Ag (no units)  Date Value  08/26/2008 NEGATIVE      HCV Ab (no units)  Date Value  11/23/2006 No     CrCl: Estimated Creatinine Clearance: 20.3 ml/min (by C-G formula based on Cr of 4.65).  Lipids:    Component Value Date/Time   CHOL 149 03/28/2013 1627   TRIG 130 03/28/2013 1627   HDL 35* 03/28/2013 1627   CHOLHDL 4.3 03/28/2013 1627   VLDL 26 03/28/2013 1627   LDLCALC 88 03/28/2013 1627  Assessment: Medication adherence - unfortunately he did not bring his medications or pill box with him today as he came to clinic right after dialysis.  We reviewed his medication regimen and he was able to articulate direction that match what he is prescribed.  He states he does not miss doses.  I performed adherence counseling with him and answered several questions he had regarding his medications.  Recommendations: Adherence praised and reinforced Will follow-up at next visit for his viral load response.  Sallee Provencal, Pharm.D., BCPS, AAHIVP Clinical Infectious Disease Pharmacist [facility] 06/14/2013, 2:26 PM

## 2013-06-14 NOTE — Progress Notes (Signed)
Patient ID: [name], male   DOB: [birth year], 47 y.o.   MRN: [id number]          [facility] for Infectious Disease  Patient Active Problem List   Diagnosis Date Noted  . ESRD (end stage renal disease) on dialysis 01/02/2012    Priority: High  . HIV DISEASE 10/19/2006    Priority: High  . CIGARETTE SMOKER 10/19/2006    Priority: High  . Diarrhea 05/09/2013  . Right ear pain 04/19/2013  . 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 PERIPHERAL NEUROPATHY 04/10/2009  . BACTEREMIA, MYCOBACTERIUM AVIUM COMPLEX 10/19/2006  . HERPES, GENITAL NEC 10/19/2006  . WARTS, OTHER SPECIFIED VIRAL 10/19/2006  . SYPHILIS NOS 10/19/2006  . DEPRESSION 10/19/2006    Patient's Medications  New Prescriptions   No medications on file  Previous Medications   DARUNAVIR ETHANOLATE (PREZISTA) 800 MG TABLET    Take 800 mg by mouth daily with breakfast.   FLUTICASONE (FLONASE) 50 MCG/ACT NASAL SPRAY    Place 2 sprays into the nose daily as needed for rhinitis.   IBUPROFEN (ADVIL,MOTRIN) 200 MG TABLET    Take 200 mg by mouth every 6 (six) hours as needed for pain.   LAMIVUDINE (EPIVIR-HBV) 5 MG/ML SOLUTION    Take 5 mg by mouth every evening.   RITONAVIR (NORVIR) 100 MG CAPSULE    Take 100 mg by mouth every evening.   TENOFOVIR (VIREAD) 300 MG TABLET    Take 300 mg by mouth once a week. On Sundays   VALACYCLOVIR (VALTREX) 500 MG TABLET    Take 500 mg by mouth daily as needed (herpes outbreaks).   Modified Medications   No medications on file  Discontinued Medications   No medications on file    Subjective: [name] is in for his routine followup visit. He forgot to bring his pillbox and medications with him. He can take his medicines off of the chart in describe taking them correctly. He is now taking his Norvir year at the same time as his Prezista.  Objective: Temp: 98.1 F (36.7 C) (09/16 1125) Temp src: Oral (09/16 1125) BP: 161/51 mmHg (09/16 1125) Pulse Rate: 70  (09/16 1125)  General: There is no change in his exam since last week  Lab Results HIV 1 RNA Quant (copies/mL)  Date Value  06/09/2013 2440*  05/09/2013 [id number]*  03/28/2013 [id number]*     CD4 T Cell Abs (/uL)  Date Value  06/09/2013 560   05/09/2013 270*  03/28/2013 470      Assessment: He is having slow descent of his viral load since restarting antiretroviral therapy. His most recent genotype shows wild type virus. I will continue his current regimen and repeat his blood work in one month.  Plan: 1. Continue current antiretroviral regimen 2. Followup after blood work in one month   Cliffton [name], MD [facility] for Infectious Disease [facility] [phone] pager   [phone] cell 06/14/2013, 11:56 AM

## 2013-07-26 ENCOUNTER — Encounter: Payer: Self-pay | Admitting: Internal Medicine

## 2013-07-26 ENCOUNTER — Ambulatory Visit (INDEPENDENT_AMBULATORY_CARE_PROVIDER_SITE_OTHER): Payer: Medicare Other | Admitting: Internal Medicine

## 2013-07-26 VITALS — BP 157/66 | HR 79 | Temp 98.3°F | Ht 71.0 in | Wt 155.5 lb

## 2013-07-26 DIAGNOSIS — B20 Human immunodeficiency virus [HIV] disease: Secondary | ICD-10-CM

## 2013-07-26 LAB — CBC
Hemoglobin: 10.9 g/dL — ABNORMAL LOW (ref 13.0–17.0)
MCH: 31.7 pg (ref 26.0–34.0)
MCV: 92.2 fL (ref 78.0–100.0)
Platelets: 112 10*3/uL — ABNORMAL LOW (ref 150–400)
RBC: 3.44 MIL/uL — ABNORMAL LOW (ref 4.22–5.81)
WBC: 5.2 10*3/uL (ref 4.0–10.5)

## 2013-07-26 NOTE — Progress Notes (Signed)
Patient ID: [name], male   DOB: [birth year], 47 y.o.   MRN: [id number]          [facility] for Infectious Disease  Patient Active Problem List   Diagnosis Date Noted  . ESRD (end stage renal disease) on dialysis 01/02/2012    Priority: High  . HIV DISEASE 10/19/2006    Priority: High  . CIGARETTE SMOKER 10/19/2006    Priority: High  . Diarrhea 05/09/2013  . Right ear pain 04/19/2013  . 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 PERIPHERAL NEUROPATHY 04/10/2009  . BACTEREMIA, MYCOBACTERIUM AVIUM COMPLEX 10/19/2006  . HERPES, GENITAL NEC 10/19/2006  . WARTS, OTHER SPECIFIED VIRAL 10/19/2006  . SYPHILIS NOS 10/19/2006  . DEPRESSION 10/19/2006    Patient's Medications  New Prescriptions   No medications on file  Previous Medications   DARUNAVIR ETHANOLATE (PREZISTA) 800 MG TABLET    Take 800 mg by mouth daily with breakfast.   FLUTICASONE (FLONASE) 50 MCG/ACT NASAL SPRAY    Place 2 sprays into the nose daily as needed for rhinitis.   IBUPROFEN (ADVIL,MOTRIN) 200 MG TABLET    Take 200 mg by mouth every 6 (six) hours as needed for pain.   LAMIVUDINE (EPIVIR-HBV) 5 MG/ML SOLUTION    Take 5 mg by mouth every evening.   RITONAVIR (NORVIR) 100 MG CAPSULE    Take 100 mg by mouth every evening.   TENOFOVIR (VIREAD) 300 MG TABLET    Take 300 mg by mouth once a week. On Sundays   VALACYCLOVIR (VALTREX) 500 MG TABLET    Take 500 mg by mouth daily as needed (herpes outbreaks).   Modified Medications   No medications on file  Discontinued Medications   No medications on file    Subjective: [name] is in for his routine visit. He denies missing any doses of his medications since his last visit and can name all of his medicines and described taking them correctly. He continues to have financial difficulties since his fiance became ill. They lost their apartment and are currently living in a motel. He has a Clinical research associate who is helping with a financial settlement they  are expecting but cannot afford to get new housing unit. Has also been stressed out over selling charges pending on his fiance's son. Review of Systems: Pertinent items are noted in HPI.  Past Medical History  Diagnosis Date  . HIV infection   . DJD (degenerative joint disease)     right knee  . Tertiary syphilis   . Genital herpes   . Anal condyloma   . Family history of disseminated HSV infection   . Shortness of breath     "can happen at anytime" (03/04/2013)  . ESRD (end stage renal disease) on dialysis 01/02/2012    Gets diaysis at [facility] on [employer], TTS schedule.  Started dialysis in 2000.     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 Reclotted on 03/02/13 and IR placed L IJ tunneled HD catheter (a right external jugular tunneled HD cath was attempted first but did not function due to kinking across the clavicle). Pt was seen by [facility] on same admission 6/614 and said RUA AVF no longer usable, they ordered vein mapping and will see him as outpatient to set up next permanent access.     . Hypertension   . Pneumonia     hx of  . Diarrhea  occurs because of dialysis  . Hemodialysis patient     Tuesday, Thursday, Saturday    History  Substance Use Topics  . Smoking status: Current Every Day Smoker -- 0.20 packs/day for 27 years    Types: Cigarettes  . Smokeless tobacco: Never Used     Comment: cutting back from last visit  . Alcohol Use: Yes     Comment: 03/04/2013 "stopped drinking ~ 12 yr ago; never had problem w/it"    Family History  Problem Relation Age of Onset  . Diabetes Mother   . Arthritis Father     Gout- Foot    Allergies  Allergen Reactions  . Contrast Media [Iodinated Diagnostic Agents] Hives and Itching  . Acetaminophen-Codeine Nausea And Vomiting  . Oxycodone-Acetaminophen Nausea And Vomiting  . Penicillins Hives    Objective: Temp: 98.3 F (36.8 C) (10/28 1444) Temp src: Oral  (10/28 1444) BP: 157/66 mmHg (10/28 1444) Pulse Rate: 79 (10/28 1444)  General: He is a little down today  Oral: No oropharyngeal lesions Skin: No rash Lungs: Clear Cor: Regular S1 and S2 no murmurs  Lab Results HIV 1 RNA Quant (copies/mL)  Date Value  06/09/2013 2440*  05/09/2013 191478*  03/28/2013 295621*     CD4 T Cell Abs (/uL)  Date Value  06/09/2013 560   05/09/2013 270*  03/28/2013 470      Assessment: Seems as though his adherence is better despite all of the stress of his current living situation.  Plan: 1. Continue current antiretroviral regimen 2. Repeat viral load today 3. Followup in 2 weeks   Cliffton [name], MD [facility] for Infectious Disease [facility] [phone] pager   [phone] cell 07/26/2013, 3:24 PM

## 2013-07-26 NOTE — Progress Notes (Signed)
[facility] - Pharmacist    HPI: [name] is a 47 y.o. male here for follow-up.  Allergies: Allergies  Allergen Reactions  . Contrast Media [Iodinated Diagnostic Agents] Hives and Itching  . Acetaminophen-Codeine Nausea And Vomiting  . Oxycodone-Acetaminophen Nausea And Vomiting  . Penicillins Hives    Vitals: Temp: 98.3 F (36.8 C) (10/28 1444) Temp src: Oral (10/28 1444) BP: 157/66 mmHg (10/28 1444) Pulse Rate: 79 (10/28 1444)  Past Medical History: Past Medical History  Diagnosis Date  . HIV infection   . DJD (degenerative joint disease)     right knee  . Tertiary syphilis   . Genital herpes   . Anal condyloma   . Family history of disseminated HSV infection   . Shortness of breath     "can happen at anytime" (03/04/2013)  . ESRD (end stage renal disease) on dialysis 01/02/2012    Gets diaysis at [facility] on [employer], TTS schedule.  Started dialysis in 2000.     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 Reclotted on 03/02/13 and IR placed L IJ tunneled HD catheter (a right external jugular tunneled HD cath was attempted first but did not function due to kinking across the clavicle). Pt was seen by [facility] on same admission 6/614 and said RUA AVF no longer usable, they ordered vein mapping and will see him as outpatient to set up next permanent access.     . Hypertension   . Pneumonia     hx of  . Diarrhea     occurs because of dialysis  . Hemodialysis patient     Tuesday, Thursday, Saturday    Social History: History   Social History  . Marital Status: Divorced    Spouse Name: N/A    Number of Children: N/A  . Years of Education: N/A   Social History Main Topics  . Smoking status: Current Every Day Smoker -- 0.20 packs/day for 27 years    Types: Cigarettes  . Smokeless tobacco: Never Used     Comment: cutting back from last visit  . Alcohol Use: Yes   Comment: 03/04/2013 "stopped drinking ~ 12 yr ago; never had problem w/it"  . Drug Use: No  . Sexual Activity: Not Currently    Partners: Female     Comment: declined condoms   Other Topics Concern  . None   Social History Narrative  . None    Current Regimen: Darunavir/ritonavir Lamivudine suspension Tenofovir weekly on Sundays  Labs: HIV 1 RNA Quant (copies/mL)  Date Value  06/09/2013 2440*  05/09/2013 [id number]*  03/28/2013 [id number]*     CD4 T Cell Abs (/uL)  Date Value  06/09/2013 560   05/09/2013 270*  03/28/2013 470      Hep B S Ab (no units)  Date Value  11/23/2006 No      Hepatitis B Surface Ag (no units)  Date Value  08/26/2008 NEGATIVE      HCV Ab (no units)  Date Value  11/23/2006 No     CrCl: Estimated Creatinine Clearance: 20 ml/min (by C-G formula based on Cr of 4.65).  Lipids:    Component Value Date/Time   CHOL 149 03/28/2013 1627   TRIG 130 03/28/2013 1627   HDL 35* 03/28/2013 1627   CHOLHDL 4.3 03/28/2013 1627   VLDL 26 03/28/2013 1627   LDLCALC 88 03/28/2013 1627    Assessment: [name]  is under significant life stressors, including his fiance's illness and housing instability.  Despite this, he endorses compliance with his medications and accurately describes his medications and dosing regimens.  Recommendations: Medication adherence was reinforced and praised Will follow-up with his labs today to evaluate viral response He needs SPAP recertification and will ask at check-out to set this appointment up.  Sallee Provencal, Pharm.D., BCPS, AAHIVP Clinical Infectious Disease Pharmacist [facility] 07/26/2013, 3:13 PM

## 2013-07-27 LAB — COMPREHENSIVE METABOLIC PANEL
ALT: 12 U/L (ref 0–53)
AST: 16 U/L (ref 0–37)
Albumin: 3.8 g/dL (ref 3.5–5.2)
CO2: 33 mEq/L — ABNORMAL HIGH (ref 19–32)
Calcium: 10 mg/dL (ref 8.4–10.5)
Chloride: 98 mEq/L (ref 96–112)
Creat: 6.07 mg/dL — ABNORMAL HIGH (ref 0.50–1.35)
Glucose, Bld: 89 mg/dL (ref 70–99)
Potassium: 3.6 mEq/L (ref 3.5–5.3)
Sodium: 141 mEq/L (ref 135–145)
Total Protein: 7.2 g/dL (ref 6.0–8.3)

## 2013-07-27 LAB — HIV-1 RNA QUANT-NO REFLEX-BLD
HIV 1 RNA Quant: 115 copies/mL — ABNORMAL HIGH (ref <20)
HIV-1 RNA Quant, Log: 2.06 {Log} — ABNORMAL HIGH (ref <1.30)

## 2013-08-09 ENCOUNTER — Ambulatory Visit (INDEPENDENT_AMBULATORY_CARE_PROVIDER_SITE_OTHER): Payer: Medicare Other | Admitting: Internal Medicine

## 2013-08-09 ENCOUNTER — Encounter: Payer: Self-pay | Admitting: Internal Medicine

## 2013-08-09 VITALS — BP 181/110 | HR 80 | Temp 98.0°F | Ht 71.0 in | Wt 155.5 lb

## 2013-08-09 DIAGNOSIS — B20 Human immunodeficiency virus [HIV] disease: Secondary | ICD-10-CM

## 2013-08-09 DIAGNOSIS — Z79899 Other long term (current) drug therapy: Secondary | ICD-10-CM

## 2013-08-09 NOTE — Progress Notes (Signed)
Patient ID: [name], male   DOB: [birth year], 47 y.o.   MRN: [id number]          [facility] for Infectious Disease  Patient Active Problem List   Diagnosis Date Noted  . ESRD (end stage renal disease) on dialysis 01/02/2012    Priority: High  . HIV DISEASE 10/19/2006    Priority: High  . CIGARETTE SMOKER 10/19/2006    Priority: High  . Diarrhea 05/09/2013  . Right ear pain 04/19/2013  . 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 PERIPHERAL NEUROPATHY 04/10/2009  . BACTEREMIA, MYCOBACTERIUM AVIUM COMPLEX 10/19/2006  . HERPES, GENITAL NEC 10/19/2006  . WARTS, OTHER SPECIFIED VIRAL 10/19/2006  . SYPHILIS NOS 10/19/2006  . DEPRESSION 10/19/2006    Patient's Medications  New Prescriptions   No medications on file  Previous Medications   DARUNAVIR ETHANOLATE (PREZISTA) 800 MG TABLET    Take 800 mg by mouth daily with breakfast.   FLUTICASONE (FLONASE) 50 MCG/ACT NASAL SPRAY    Place 2 sprays into the nose daily as needed for rhinitis.   IBUPROFEN (ADVIL,MOTRIN) 200 MG TABLET    Take 200 mg by mouth every 6 (six) hours as needed for pain.   LAMIVUDINE (EPIVIR-HBV) 5 MG/ML SOLUTION    Take 5 mg by mouth every evening.   RITONAVIR (NORVIR) 100 MG CAPSULE    Take 100 mg by mouth every evening.   TENOFOVIR (VIREAD) 300 MG TABLET    Take 300 mg by mouth once a week. On Sundays   VALACYCLOVIR (VALTREX) 500 MG TABLET    Take 500 mg by mouth daily as needed (herpes outbreaks).   Modified Medications   No medications on file  Discontinued Medications   No medications on file    Subjective: [name] is in for his routine visit. He is using a pill box and denies missing a single dose of his medications since his last visit. He can name he took his medicines and described taking them correctly. Unfortunately he is late on ADAP recertification and will run out of his Prezista at the end of the week. Review of Systems: Pertinent items are noted in HPI.  Past  Medical History  Diagnosis Date  . HIV infection   . DJD (degenerative joint disease)     right knee  . Tertiary syphilis   . Genital herpes   . Anal condyloma   . Family history of disseminated HSV infection   . Shortness of breath     "can happen at anytime" (03/04/2013)  . ESRD (end stage renal disease) on dialysis 01/02/2012    Gets diaysis at [facility] on [employer], TTS schedule.  Started dialysis in 2000.     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 Reclotted on 03/02/13 and IR placed L IJ tunneled HD catheter (a right external jugular tunneled HD cath was attempted first but did not function due to kinking across the clavicle). Pt was seen by [facility] on same admission 6/614 and said RUA AVF no longer usable, they ordered vein mapping and will see him as outpatient to set up next permanent access.     . Hypertension   . Pneumonia     hx of  . Diarrhea     occurs because of dialysis  . Hemodialysis patient     Tuesday, Thursday, Saturday    History  Substance Use Topics  . Smoking status: Current  Every Day Smoker -- 0.20 packs/day for 27 years    Types: Cigarettes  . Smokeless tobacco: Never Used     Comment: cutting back from last visit  . Alcohol Use: Yes     Comment: 03/04/2013 "stopped drinking ~ 12 yr ago; never had problem w/it"    Family History  Problem Relation Age of Onset  . Diabetes Mother   . Arthritis Father     Gout- Foot    Allergies  Allergen Reactions  . Contrast Media [Iodinated Diagnostic Agents] Hives and Itching  . Acetaminophen-Codeine Nausea And Vomiting  . Oxycodone-Acetaminophen Nausea And Vomiting  . Penicillins Hives    Objective: Temp: 98 F (36.7 C) (11/11 1436) Temp src: Oral (11/11 1436) BP: 181/110 mmHg (11/11 1436) Pulse Rate: 80 (11/11 1436)  General: He is fatigued having just come from hemodialysis Oral: No oropharyngeal lesions Skin: No rash Lungs: Clear Cor:  Regular S1-S2 no murmurs  Lab Results HIV 1 RNA Quant (copies/mL)  Date Value  07/26/2013 115*  06/09/2013 2440*  05/09/2013 191478*     CD4 T Cell Abs (/uL)  Date Value  07/26/2013 550   06/09/2013 560   05/09/2013 270*     Assessment: His adherence has improved over the past several months and his infection is coming back under control. We will try to get him an emergency supply of his medications pending recertification.  Plan: 1. Continue Combivir, Viread, Prezista and Norvir. I told him that if he ever runs out of one of his medicine he needs to stop all the medications until he can take the full complement 2. ADAP recertification 3. Followup after lab work in 3 months   Cliffton [name], MD [facility] for Infectious Disease [facility] [phone] pager   [phone] cell 08/09/2013, 2:48 PM

## 2013-08-10 ENCOUNTER — Other Ambulatory Visit: Payer: Self-pay | Admitting: *Deleted

## 2013-08-10 DIAGNOSIS — B20 Human immunodeficiency virus [HIV] disease: Secondary | ICD-10-CM

## 2013-08-10 MED ORDER — RITONAVIR 100 MG PO CAPS: 100.0000 mg | ORAL_CAPSULE | Freq: Every evening | ORAL | Status: DC

## 2013-08-10 MED ORDER — TENOFOVIR DISOPROXIL FUMARATE 300 MG PO TABS: 300.0000 mg | ORAL_TABLET | ORAL | Status: DC

## 2013-08-10 MED ORDER — LAMIVUDINE 5 MG/ML PO SOLN: 5.0000 mg | Freq: Every evening | ORAL | Status: DC

## 2013-08-10 MED ORDER — DARUNAVIR ETHANOLATE 800 MG PO TABS: 800.0000 mg | ORAL_TABLET | Freq: Every day | ORAL | Status: DC

## 2013-09-13 ENCOUNTER — Encounter (HOSPITAL_COMMUNITY): Payer: Self-pay | Admitting: Emergency Medicine

## 2013-09-13 ENCOUNTER — Emergency Department (HOSPITAL_COMMUNITY): Payer: Medicare Other

## 2013-09-13 ENCOUNTER — Other Ambulatory Visit: Payer: Self-pay | Admitting: Internal Medicine

## 2013-09-13 ENCOUNTER — Emergency Department (HOSPITAL_COMMUNITY)
Admission: EM | Admit: 2013-09-13 | Discharge: 2013-09-13 | Disposition: A | Discharge status: Home / Self Care | Payer: Medicare Other | Attending: Emergency Medicine | Admitting: Emergency Medicine

## 2013-09-13 DIAGNOSIS — Z792 Long term (current) use of antibiotics: Secondary | ICD-10-CM | POA: Insufficient documentation

## 2013-09-13 DIAGNOSIS — Z880 Allergy status to penicillin: Secondary | ICD-10-CM | POA: Insufficient documentation

## 2013-09-13 DIAGNOSIS — Z8619 Personal history of other infectious and parasitic diseases: Secondary | ICD-10-CM | POA: Insufficient documentation

## 2013-09-13 DIAGNOSIS — Z8739 Personal history of other diseases of the musculoskeletal system and connective tissue: Secondary | ICD-10-CM | POA: Insufficient documentation

## 2013-09-13 DIAGNOSIS — J069 Acute upper respiratory infection, unspecified: Secondary | ICD-10-CM | POA: Diagnosis present

## 2013-09-13 DIAGNOSIS — I120 Hypertensive chronic kidney disease with stage 5 chronic kidney disease or end stage renal disease: Secondary | ICD-10-CM | POA: Insufficient documentation

## 2013-09-13 DIAGNOSIS — Z8701 Personal history of pneumonia (recurrent): Secondary | ICD-10-CM | POA: Insufficient documentation

## 2013-09-13 DIAGNOSIS — F17200 Nicotine dependence, unspecified, uncomplicated: Secondary | ICD-10-CM | POA: Insufficient documentation

## 2013-09-13 DIAGNOSIS — R062 Wheezing: Secondary | ICD-10-CM | POA: Insufficient documentation

## 2013-09-13 DIAGNOSIS — Z21 Asymptomatic human immunodeficiency virus [HIV] infection status: Secondary | ICD-10-CM | POA: Insufficient documentation

## 2013-09-13 DIAGNOSIS — IMO0002 Reserved for concepts with insufficient information to code with codable children: Secondary | ICD-10-CM | POA: Insufficient documentation

## 2013-09-13 DIAGNOSIS — Z992 Dependence on renal dialysis: Secondary | ICD-10-CM | POA: Insufficient documentation

## 2013-09-13 DIAGNOSIS — N186 End stage renal disease: Secondary | ICD-10-CM | POA: Insufficient documentation

## 2013-09-13 LAB — CBC WITH DIFFERENTIAL/PLATELET
Eosinophils Absolute: 0.3 10*3/uL (ref 0.0–0.7)
Eosinophils Relative: 4 % (ref 0–5)
HCT: 33.2 % — ABNORMAL LOW (ref 39.0–52.0)
Lymphocytes Relative: 24 % (ref 12–46)
Lymphs Abs: 1.8 10*3/uL (ref 0.7–4.0)
MCH: 33.6 pg (ref 26.0–34.0)
MCV: 99.7 fL (ref 78.0–100.0)
Monocytes Absolute: 0.9 10*3/uL (ref 0.1–1.0)
Monocytes Relative: 11 % (ref 3–12)
Platelets: 159 10*3/uL (ref 150–400)
RBC: 3.33 MIL/uL — ABNORMAL LOW (ref 4.22–5.81)
WBC: 7.6 10*3/uL (ref 4.0–10.5)

## 2013-09-13 LAB — BASIC METABOLIC PANEL
BUN: 22 mg/dL (ref 6–23)
CO2: 27 mEq/L (ref 19–32)
Calcium: 9.9 mg/dL (ref 8.4–10.5)
Creatinine, Ser: 7.76 mg/dL — ABNORMAL HIGH (ref 0.50–1.35)
GFR calc non Af Amer: 7 mL/min — ABNORMAL LOW (ref >90)
Glucose, Bld: 106 mg/dL — ABNORMAL HIGH (ref 70–99)
Sodium: 140 mEq/L (ref 135–145)

## 2013-09-13 MED ORDER — HYDROCOD POLST-CHLORPHEN POLST 10-8 MG/5ML PO LQCR: 5.0000 mL | Freq: Two times a day (BID) | ORAL | Status: DC | PRN

## 2013-09-13 MED ORDER — ALBUTEROL SULFATE HFA 108 (90 BASE) MCG/ACT IN AERS
6.0000 | INHALATION_SPRAY | Freq: Once | RESPIRATORY_TRACT | Status: AC
  Administered 2013-09-13: 6 via RESPIRATORY_TRACT
  Filled 2013-09-13: qty 6.7

## 2013-09-13 MED ORDER — ALBUTEROL SULFATE HFA 108 (90 BASE) MCG/ACT IN AERS: 1.0000 | INHALATION_SPRAY | Freq: Four times a day (QID) | RESPIRATORY_TRACT | Status: DC | PRN

## 2013-09-13 MED ORDER — ALBUTEROL SULFATE (5 MG/ML) 0.5% IN NEBU
5.0000 mg | INHALATION_SOLUTION | Freq: Once | RESPIRATORY_TRACT | Status: AC
  Administered 2013-09-13: 5 mg via RESPIRATORY_TRACT
  Filled 2013-09-13: qty 1

## 2013-09-13 MED ORDER — ALBUTEROL SULFATE HFA 108 (90 BASE) MCG/ACT IN AERS: 4.0000 | INHALATION_SPRAY | Freq: Once | RESPIRATORY_TRACT | Status: DC

## 2013-09-13 NOTE — ED Notes (Signed)
Pt c/o cough and nasal congestion; pt sts some SOB with cough only; pt had last dialysis today

## 2013-09-13 NOTE — ED Notes (Signed)
Pt's wife came to front desk saying that pt was having a hard time breathing.  Pt assessed in lobby and found the have wheezing in all fields, RR of 26, and O2 saturation of 93 on room air.

## 2013-09-13 NOTE — ED Provider Notes (Signed)
CSN: [id number]     Arrival date & time 09/13/13  1706 History   First MD Initiated Contact with Patient 09/13/13 2103     Chief Complaint  Patient presents with  . Cough   (Consider location/radiation/quality/duration/timing/severity/associated sxs/prior Treatment) Patient is a 47 y.o. male presenting with cough. The history is provided by the patient.  Cough Cough characteristics:  Productive Sputum characteristics:  Yellow Severity:  Mild Onset quality:  Sudden Duration:  3 days Timing:  Constant Progression:  Unchanged Chronicity:  New Smoker: yes   Context comment:  URI Relieved by:  Nothing Worsened by:  Nothing tried Ineffective treatments:  None tried Associated symptoms: no chest pain, no fever, no headaches, no rhinorrhea and no shortness of breath     Past Medical History  Diagnosis Date  . HIV infection   . DJD (degenerative joint disease)     right knee  . Tertiary syphilis   . Genital herpes   . Anal condyloma   . Family history of disseminated HSV infection   . Shortness of breath     "can happen at anytime" (03/04/2013)  . ESRD (end stage renal disease) on dialysis 01/02/2012    Gets diaysis at [facility] on [employer], TTS schedule.  Started dialysis in 2000.     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 Reclotted on 03/02/13 and IR placed L IJ tunneled HD catheter (a right external jugular tunneled HD cath was attempted first but did not function due to kinking across the clavicle). Pt was seen by [facility] on same admission 6/614 and said RUA AVF no longer usable, they ordered vein mapping and will see him as outpatient to set up next permanent access.     . Hypertension   . Pneumonia     hx of  . Diarrhea     occurs because of dialysis  . Hemodialysis patient     Tuesday, Thursday, Saturday   Past Surgical History  Procedure Laterality Date  . Av fistula placement, brachiocephalic Right  06/16/11  . Appendectomy    . Tonsillectomy    . Cholecystectomy    . Incision and drainage abscess      abdominal  . Av fistula placement Right 06/16/11  . Revison of arteriovenous fistula Right 04/20/2013    Procedure: INSERTION OF ARTERIOVENOUS GORTEX GRAFT;  Surgeon: Sherren Kerns, MD;  Location: [facility] OR;  Service: Vascular;  Laterality: Right;  Ultrasound Guided   Family History  Problem Relation Age of Onset  . Diabetes Mother   . Arthritis Father     Gout- Foot   History  Substance Use Topics  . Smoking status: Current Every Day Smoker -- 0.20 packs/day for 27 years    Types: Cigarettes  . Smokeless tobacco: Never Used     Comment: cutting back from last visit  . Alcohol Use: Yes     Comment: 03/04/2013 "stopped drinking ~ 12 yr ago; never had problem w/it"    Review of Systems  Constitutional: Negative for fever.  HENT: Negative for drooling and rhinorrhea.   Eyes: Negative for pain.  Respiratory: Positive for cough. Negative for shortness of breath.   Cardiovascular: Negative for chest pain and leg swelling.  Gastrointestinal: Negative for nausea, vomiting, abdominal pain and diarrhea.  Genitourinary: Negative for dysuria and hematuria.  Musculoskeletal: Negative for gait problem and neck pain.  Skin: Negative for color change.  Neurological: Negative for  numbness and headaches.  Hematological: Negative for adenopathy.  Psychiatric/Behavioral: Negative for behavioral problems.  All other systems reviewed and are negative.    Allergies  Contrast media; Acetaminophen-codeine; Oxycodone-acetaminophen; and Penicillins  Home Medications   Current Outpatient Rx  Name  Route  Sig  Dispense  Refill  . azithromycin (ZITHROMAX) 250 MG tablet   Oral   Take 250-500 mg by mouth daily. To take for 4 days. Pt's on day 1 of therapy. Patient took 500 mg dose today for loading dose         . Darunavir Ethanolate (PREZISTA) 800 MG tablet   Oral   Take 1 tablet (800 mg  total) by mouth daily with breakfast.   30 tablet   6   . fluticasone (FLONASE) 50 MCG/ACT nasal spray   Nasal   Place 2 sprays into the nose daily as needed for rhinitis.         Marland Kitchen lamivudine (EPIVIR-HBV) 5 MG/ML solution   Oral   Take 1 mL (5 mg total) by mouth every evening.   240 mL   6   . ritonavir (NORVIR) 100 MG capsule   Oral   Take 1 capsule (100 mg total) by mouth every evening.   30 capsule   6   . tenofovir (VIREAD) 300 MG tablet   Oral   Take 1 tablet (300 mg total) by mouth once a week. On Sundays   4 tablet   6   . valACYclovir (VALTREX) 500 MG tablet   Oral   Take 500 mg by mouth daily as needed (herpes outbreaks).           BP 161/78  Pulse 97  Temp(Src) 98.1 F (36.7 C) (Oral)  Resp 24  Wt 155 lb (70.308 kg)  SpO2 94% Physical Exam  Nursing note and vitals reviewed. Constitutional: He is oriented to person, place, and time. He appears well-developed and well-nourished.  HENT:  Head: Normocephalic and atraumatic.  Right Ear: External ear normal.  Left Ear: External ear normal.  Nose: Nose normal.  Mouth/Throat: Oropharynx is clear and moist. No oropharyngeal exudate.  Eyes: Conjunctivae and EOM are normal. Pupils are equal, round, and reactive to light.  Neck: Normal range of motion. Neck supple.  Cardiovascular: Normal rate, regular rhythm, normal heart sounds and intact distal pulses.  Exam reveals no gallop and no friction rub.   No murmur heard. Pulmonary/Chest: Effort normal. No respiratory distress. He has wheezes (mild wheezing bilaterally).  Abdominal: Soft. Bowel sounds are normal. He exhibits no distension. There is no tenderness. There is no rebound and no guarding.  Musculoskeletal: Normal range of motion. He exhibits no edema and no tenderness.  Neurological: He is alert and oriented to person, place, and time.  Skin: Skin is warm and dry.  Psychiatric: He has a normal mood and affect. His behavior is normal.    ED Course   Procedures (including critical care time) Labs Review Labs Reviewed  CBC WITH DIFFERENTIAL - Abnormal; Notable for the following:    RBC 3.33 (*)    Hemoglobin 11.2 (*)    HCT 33.2 (*)    All other components within normal limits  BASIC METABOLIC PANEL - Abnormal; Notable for the following:    Glucose, Bld 106 (*)    Creatinine, Ser 7.76 (*)    GFR calc non Af Amer 7 (*)    GFR calc Af Amer 9 (*)    All other components within normal limits  Imaging Review Dg Chest 2 View (if Patient Has Fever And/or Copd)  09/13/2013   CLINICAL DATA:  Cough.  End-stage renal disease on dialysis.  HIV.  EXAM: CHEST  2 VIEW  COMPARISON:  01/20/2013  FINDINGS: Heart size remains within normal limits. Pulmonary vascular congestion appears stable. No evidence of acute infiltrate or edema. No evidence of pleural effusion. No mass or lymphadenopathy identified. A stent is again seen in the left subclavian region.  IMPRESSION: No acute findings.   Electronically Signed   By: Myles Rosenthal M.D.   On: 09/13/2013 19:32    EKG Interpretation   None       MDM   1. Viral URI with cough    9:40 PM 47 y.o. male w hx of HIV (recent CD4 of 550) s/p HD today who pw productive cough for 2-3 days. He denies fever, vomiting, diarrhea. He notes mild sob. He is wheezing here, feeling better after neb. Will give alb inhaler. He is currently on azithromycin. Will rec sx therapy and return for fever or any worsening.     I have discussed the diagnosis/risks/treatment options with the patient and family and believe the pt to be eligible for discharge home to follow-up with pcp as needed. We also discussed returning to the ED immediately if new or worsening sx occur. We discussed the sx which are most concerning (e.g., fever, worsening sob, cp) that necessitate immediate return. Any new prescriptions provided to the patient are listed below.  Discharge Medication List as of 09/13/2013 11:03 PM    START taking these  medications   Details  albuterol (PROVENTIL HFA;VENTOLIN HFA) 108 (90 BASE) MCG/ACT inhaler Inhale 1-2 puffs into the lungs every 6 (six) hours as needed for wheezing or shortness of breath., Starting 09/13/2013, Until Discontinued, Print    chlorpheniramine-HYDROcodone (TUSSIONEX PENNKINETIC ER) 10-8 MG/5ML LQCR Take 5 mLs by mouth every 12 (twelve) hours as needed for cough., Starting 09/13/2013, Until Discontinued, Print            Junius Argyle, MD 09/14/13 1247

## 2013-09-26 ENCOUNTER — Other Ambulatory Visit: Payer: Self-pay | Admitting: *Deleted

## 2013-09-26 DIAGNOSIS — B20 Human immunodeficiency virus [HIV] disease: Secondary | ICD-10-CM

## 2013-09-26 MED ORDER — LAMIVUDINE 5 MG/ML PO SOLN: 5.0000 mg | Freq: Every evening | ORAL | Status: DC

## 2013-09-26 MED ORDER — RITONAVIR 100 MG PO CAPS: 100.0000 mg | ORAL_CAPSULE | Freq: Every evening | ORAL | Status: DC

## 2013-09-26 MED ORDER — DARUNAVIR ETHANOLATE 800 MG PO TABS: 800.0000 mg | ORAL_TABLET | Freq: Every day | ORAL | Status: DC

## 2013-09-26 MED ORDER — TENOFOVIR DISOPROXIL FUMARATE 300 MG PO TABS: 300.0000 mg | ORAL_TABLET | ORAL | Status: DC

## 2013-09-28 ENCOUNTER — Other Ambulatory Visit: Payer: Self-pay | Admitting: *Deleted

## 2013-09-28 ENCOUNTER — Other Ambulatory Visit: Payer: Self-pay | Admitting: Licensed Clinical Social Worker

## 2013-09-28 DIAGNOSIS — B20 Human immunodeficiency virus [HIV] disease: Secondary | ICD-10-CM

## 2013-09-28 MED ORDER — LAMIVUDINE 10 MG/ML PO SOLN: 5.0000 mg | Freq: Every day | ORAL | Status: DC

## 2013-09-28 MED ORDER — LAMIVUDINE 5 MG/ML PO SOLN: 5.0000 mg | Freq: Every evening | ORAL | Status: DC

## 2013-09-30 ENCOUNTER — Other Ambulatory Visit: Payer: Self-pay | Admitting: *Deleted

## 2013-09-30 DIAGNOSIS — B20 Human immunodeficiency virus [HIV] disease: Secondary | ICD-10-CM

## 2013-09-30 MED ORDER — RITONAVIR 100 MG PO TABS: 100.0000 mg | ORAL_TABLET | Freq: Every day | ORAL | Status: DC

## 2013-11-07 ENCOUNTER — Other Ambulatory Visit: Payer: Self-pay | Admitting: Licensed Clinical Social Worker

## 2013-11-07 ENCOUNTER — Ambulatory Visit: Payer: Medicare Other

## 2013-11-07 DIAGNOSIS — B20 Human immunodeficiency virus [HIV] disease: Secondary | ICD-10-CM

## 2013-11-07 MED ORDER — LAMIVUDINE 10 MG/ML PO SOLN: 5.0000 mg | Freq: Every day | ORAL | Status: DC

## 2013-11-07 MED ORDER — RITONAVIR 100 MG PO TABS: 100.0000 mg | ORAL_TABLET | Freq: Every day | ORAL | Status: DC

## 2013-11-07 MED ORDER — TENOFOVIR DISOPROXIL FUMARATE 300 MG PO TABS
300.0000 mg | ORAL_TABLET | ORAL | Status: DC
Start: 2013-11-07 — End: 2013-12-08

## 2013-11-07 MED ORDER — DARUNAVIR ETHANOLATE 800 MG PO TABS: 800.0000 mg | ORAL_TABLET | Freq: Every day | ORAL | Status: DC

## 2013-11-10 ENCOUNTER — Other Ambulatory Visit: Payer: Self-pay | Admitting: Licensed Clinical Social Worker

## 2013-11-10 DIAGNOSIS — B20 Human immunodeficiency virus [HIV] disease: Secondary | ICD-10-CM

## 2013-11-10 MED ORDER — RITONAVIR 100 MG PO TABS: 100.0000 mg | ORAL_TABLET | Freq: Every day | ORAL | Status: DC

## 2013-11-10 MED ORDER — DARUNAVIR ETHANOLATE 800 MG PO TABS: 800.0000 mg | ORAL_TABLET | Freq: Every day | ORAL | Status: DC

## 2013-11-10 MED ORDER — LAMIVUDINE 10 MG/ML PO SOLN: 5.0000 mg | Freq: Every day | ORAL | Status: DC

## 2013-12-02 ENCOUNTER — Other Ambulatory Visit: Payer: Self-pay | Admitting: Internal Medicine

## 2013-12-08 ENCOUNTER — Other Ambulatory Visit: Payer: Self-pay | Admitting: *Deleted

## 2013-12-08 DIAGNOSIS — B20 Human immunodeficiency virus [HIV] disease: Secondary | ICD-10-CM

## 2013-12-08 MED ORDER — TENOFOVIR DISOPROXIL FUMARATE 300 MG PO TABS: 300.0000 mg | ORAL_TABLET | ORAL | Status: DC

## 2013-12-08 MED ORDER — DARUNAVIR ETHANOLATE 800 MG PO TABS: 800.0000 mg | ORAL_TABLET | Freq: Every day | ORAL | Status: DC

## 2013-12-08 MED ORDER — RITONAVIR 100 MG PO TABS: 100.0000 mg | ORAL_TABLET | Freq: Every day | ORAL | Status: DC

## 2013-12-08 MED ORDER — LAMIVUDINE 10 MG/ML PO SOLN: 5.0000 mg | Freq: Every day | ORAL | Status: DC

## 2014-01-11 ENCOUNTER — Encounter: Payer: Self-pay | Admitting: Internal Medicine

## 2014-07-17 ENCOUNTER — Telehealth: Payer: Self-pay | Admitting: *Deleted

## 2014-07-17 NOTE — Telephone Encounter (Signed)
Appt made for 07/20/14 @ 2:30 PM w/ Dr. Orvan Falconerampbell

## 2014-07-20 ENCOUNTER — Ambulatory Visit (INDEPENDENT_AMBULATORY_CARE_PROVIDER_SITE_OTHER): Payer: Medicare Other | Admitting: Internal Medicine

## 2014-07-20 VITALS — BP 114/75 | HR 88 | Temp 98.5°F | Wt 165.0 lb

## 2014-07-20 DIAGNOSIS — B20 Human immunodeficiency virus [HIV] disease: Secondary | ICD-10-CM

## 2014-07-20 DIAGNOSIS — Z79899 Other long term (current) drug therapy: Secondary | ICD-10-CM

## 2014-07-20 DIAGNOSIS — Z113 Encounter for screening for infections with a predominantly sexual mode of transmission: Secondary | ICD-10-CM

## 2014-07-20 LAB — LIPID PANEL
CHOL/HDL RATIO: 5.1 ratio
Cholesterol: 224 mg/dL — ABNORMAL HIGH (ref 0–200)
HDL: 44 mg/dL (ref >39)
LDL CALC: 128 mg/dL — AB (ref 0–99)
Triglycerides: 260 mg/dL — ABNORMAL HIGH (ref <150)
VLDL: 52 mg/dL — ABNORMAL HIGH (ref 0–40)

## 2014-07-20 LAB — COMPREHENSIVE METABOLIC PANEL
ALT: 17 U/L (ref 0–53)
AST: 16 U/L (ref 0–37)
Albumin: 4 g/dL (ref 3.5–5.2)
Alkaline Phosphatase: 170 U/L — ABNORMAL HIGH (ref 39–117)
BUN: 18 mg/dL (ref 6–23)
CHLORIDE: 98 meq/L (ref 96–112)
CO2: 30 meq/L (ref 19–32)
Calcium: 9.7 mg/dL (ref 8.4–10.5)
Creat: 8.99 mg/dL — ABNORMAL HIGH (ref 0.50–1.35)
Glucose, Bld: 76 mg/dL (ref 70–99)
Potassium: 4.7 mEq/L (ref 3.5–5.3)
Sodium: 140 mEq/L (ref 135–145)
TOTAL PROTEIN: 7.7 g/dL (ref 6.0–8.3)
Total Bilirubin: 0.7 mg/dL (ref 0.2–1.2)

## 2014-07-20 NOTE — Progress Notes (Signed)
Patient ID: [name], male   DOB: 02/02/1966, 48 y.o.   MRN: [id number]          Patient Active Problem List   Diagnosis Date Noted  . ESRD (end stage renal disease) on dialysis 01/02/2012    Priority: High  . Human immunodeficiency virus (HIV) disease 10/19/2006    Priority: High  . CIGARETTE SMOKER 10/19/2006    Priority: High  . Viral URI with cough 09/13/2013  . Diarrhea 05/09/2013  . Right ear pain 04/19/2013  . 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 PERIPHERAL NEUROPATHY 04/10/2009  . BACTEREMIA, MYCOBACTERIUM AVIUM COMPLEX 10/19/2006  . HERPES, GENITAL NEC 10/19/2006  . WARTS, OTHER SPECIFIED VIRAL 10/19/2006  . SYPHILIS NOS 10/19/2006  . DEPRESSION 10/19/2006    Patient's Medications  New Prescriptions   No medications on file  Previous Medications   ALBUTEROL (PROVENTIL HFA;VENTOLIN HFA) 108 (90 BASE) MCG/ACT INHALER    Inhale 1-2 puffs into the lungs every 6 (six) hours as needed for wheezing or shortness of breath.   DARUNAVIR ETHANOLATE (PREZISTA) 800 MG TABLET    Take 1 tablet (800 mg total) by mouth daily with breakfast.   FLUTICASONE (FLONASE) 50 MCG/ACT NASAL SPRAY    Place 2 sprays into the nose daily as needed for rhinitis.   LAMIVUDINE (EPIVIR) 10 MG/ML SOLUTION    Take 0.5 mLs (5 mg total) by mouth at bedtime.   RITONAVIR (NORVIR) 100 MG TABS TABLET    Take 1 tablet (100 mg total) by mouth daily.   TENOFOVIR (VIREAD) 300 MG TABLET    Take 1 tablet (300 mg total) by mouth once a week. On Sundays   VALACYCLOVIR (VALTREX) 500 MG TABLET    Take 500 mg by mouth daily as needed (herpes outbreaks).   Modified Medications   No medications on file  Discontinued Medications   AZITHROMYCIN (ZITHROMAX) 250 MG TABLET    Take 250-500 mg by mouth daily. To take for 4 days. Pt's on day 1 of therapy. Patient took 500 mg dose today for loading dose   VIREAD 300 MG TABLET    TAKE 1 TABLET BY MOUTH ONCE A WEEK ON SUNDAYS    Subjective: [name] is  in for his first visit in one year. He can recall missing only 2 doses of his HIV medications. He actually did not miss completely but took them late at night after forgetting his morning dose. He is still struggling financially but has had no trouble obtaining his medications. He is still living with his fiance. Her son who is currently 42 years old and is still having considerable problems with the police regarding drug trafficking. He has had some problems with grumbling in his stomach. It improved with Gas-X which she took x1 several months ago. He did not continue taking it because he wasn't sure if it was okay to mix with his HIV medications. He occasionally takes some Imodium for her intermittent diarrhea. He has not had a fever, chills or sweats. Review of Systems: Pertinent items are noted in HPI.  Past Medical History  Diagnosis Date  . HIV infection   . DJD (degenerative joint disease)     right knee  . Tertiary syphilis   . Genital herpes   . Anal condyloma   . Family history of disseminated HSV infection   . Shortness of breath     "can happen at anytime" (03/04/2013)  . ESRD (end stage renal disease) on dialysis 01/02/2012  G[facility] at [facility] on [address], TTS schedule.  Started dialysis in 2000.     Marland Kitchen. 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 Reclotted on 03/02/13 and IR placed L IJ tunneled HD catheter (a right external jugular tunneled HD cath was attempted first but did not function due to kinking across the clavicle). Pt was seen by [facility] on same admission 6/614 and said RUA AVF no longer usable, they ordered vein mapping and will see him as outpatient to set up next permanent access.     . Hypertension   . Pneumonia     hx of  . Diarrhea     occurs because of dialysis  . Hemodialysis patient     Tuesday, Thursday, Saturday    History  Substance Use Topics  . Smoking status: Current Every Day Smoker --  0.20 packs/day for 27 years    Types: Cigarettes  . Smokeless tobacco: Never Used     Comment: cutting back from last visit  . Alcohol Use: Yes     Comment: 03/04/2013 "stopped drinking ~ 12 yr ago; never had problem w/it"    Family History  Problem Relation Age of Onset  . Diabetes Mother   . Arthritis Father     Gout- Foot    Allergies  Allergen Reactions  . Contrast Media [Iodinated Diagnostic Agents] Hives and Itching  . Acetaminophen-Codeine Nausea And Vomiting  . Oxycodone-Acetaminophen Nausea And Vomiting  . Penicillins Hives    Objective: Temp: 98.5 F (36.9 C) (10/22 1435) Temp Source: Oral (10/22 1435) BP: 114/75 mmHg (10/22 1435) Pulse Rate: 88 (10/22 1435) Body mass index is 23.02 kg/(m^2).  General: He is in good spirits as usual Oral: No oropharyngeal lesions. Teeth are in very poor condition; many are broken and missing. Skin: No rash Eyes: Chronic conjunctival redness unchanged Lungs: Clear Cor: Regular S1 and S2 with no murmurs Abdomen: Soft and nontender Joints and extremities: Right upper arm graft appears normal  Lab Results Lab Results  Component Value Date   WBC 7.6 09/13/2013   HGB 11.2* 09/13/2013   HCT 33.2* 09/13/2013   MCV 99.7 09/13/2013   PLT 159 09/13/2013    Lab Results  Component Value Date   CREATININE 7.76* 09/13/2013   BUN 22 09/13/2013   NA 140 09/13/2013   K 4.3 09/13/2013   CL 101 09/13/2013   CO2 27 09/13/2013    Lab Results  Component Value Date   ALT 12 07/26/2013   AST 16 07/26/2013   ALKPHOS 168* 07/26/2013   BILITOT 0.8 07/26/2013    Lab Results  Component Value Date   CHOL 149 03/28/2013   HDL 35* 03/28/2013   LDLCALC 88 03/28/2013   TRIG 130 03/28/2013   CHOLHDL 4.3 03/28/2013    Lab Results HIV 1 RNA Quant (copies/mL)  Date Value  07/26/2013 115*  06/09/2013 2440*  05/09/2013 [id number]*     CD4 T Cell Abs (/uL)  Date Value  07/26/2013 550   06/09/2013 560   05/09/2013 270*     Assessment: I will  continue his current 4 drug antiretroviral regimen pending repeat blood work. He may be a candidate for simplification of his regimen.  It is okay for him to take Gas-X as needed.  Plan: 1. Continue current antiretroviral regimen 2. Check lab work today 3. Dental referral 4. Followup here in 3-4 weeks   Cliffton AstersJohn Lynnox Girten, MD [facility] [phone] pager   [phone] cell 07/20/2014, 2:56 PM

## 2014-07-21 LAB — CBC
HCT: 45.4 % (ref 39.0–52.0)
Hemoglobin: 15.6 g/dL (ref 13.0–17.0)
MCH: 33.4 pg (ref 26.0–34.0)
MCHC: 34.4 g/dL (ref 30.0–36.0)
MCV: 97.2 fL (ref 78.0–100.0)
PLATELETS: 95 10*3/uL — AB (ref 150–400)
RBC: 4.67 MIL/uL (ref 4.22–5.81)
RDW: 14.5 % (ref 11.5–15.5)
WBC: 5.8 10*3/uL (ref 4.0–10.5)

## 2014-07-21 LAB — RPR

## 2014-07-21 LAB — T-HELPER CELL (CD4) - (RCID CLINIC ONLY)
CD4 T CELL HELPER: 33 % (ref 33–55)
CD4 T Cell Abs: 1030 /uL (ref 400–2700)

## 2014-07-23 LAB — HIV-1 RNA QUANT-NO REFLEX-BLD: HIV-1 RNA Quant, Log: <1.3 {Log} (ref <1.30)

## 2014-07-25 ENCOUNTER — Ambulatory Visit: Payer: Medicare Other

## 2014-07-25 ENCOUNTER — Telehealth: Payer: Self-pay | Admitting: *Deleted

## 2014-07-25 NOTE — Telephone Encounter (Signed)
Patient called and advised that the pharmacy told him he has a copay and he advised he does not and never has. After looking in the patient chart advised him he should have redone his SPAP application prior to 06/28/14 and that until he comes and does that he will have a copay. He advised he was waiting for us to call him to come in and do it like we always do. Advised the patient that he knows it needs to be done and we call who we can when we can as a courtesy but he has a responsibility to keep up with his insurance situation. Made the patient an appt for today 07/25/14 at 2 pm to renew his SPAP.

## 2014-07-26 ENCOUNTER — Telehealth: Payer: Self-pay | Admitting: *Deleted

## 2014-07-26 ENCOUNTER — Other Ambulatory Visit: Payer: Self-pay | Admitting: Licensed Clinical Social Worker

## 2014-07-26 DIAGNOSIS — B20 Human immunodeficiency virus [HIV] disease: Secondary | ICD-10-CM

## 2014-07-26 MED ORDER — TENOFOVIR DISOPROXIL FUMARATE 300 MG PO TABS: 300.0000 mg | ORAL_TABLET | ORAL | Status: DC

## 2014-07-26 MED ORDER — LAMIVUDINE 10 MG/ML PO SOLN: 5.0000 mg | Freq: Every day | ORAL | Status: DC

## 2014-07-26 MED ORDER — RITONAVIR 100 MG PO TABS: 100.0000 mg | ORAL_TABLET | Freq: Every day | ORAL | Status: DC

## 2014-07-26 MED ORDER — DARUNAVIR ETHANOLATE 800 MG PO TABS: 800.0000 mg | ORAL_TABLET | Freq: Every day | ORAL | Status: DC

## 2014-07-26 NOTE — Telephone Encounter (Signed)
Called [employer].  [name] was approved for $4000 grant effective until 07-26-15.

## 2014-08-08 ENCOUNTER — Encounter: Payer: Self-pay | Admitting: Internal Medicine

## 2014-08-08 ENCOUNTER — Ambulatory Visit (INDEPENDENT_AMBULATORY_CARE_PROVIDER_SITE_OTHER): Payer: Medicare Other | Admitting: Internal Medicine

## 2014-08-08 VITALS — BP 94/63 | HR 97 | Temp 98.9°F | Wt 163.2 lb

## 2014-08-08 DIAGNOSIS — B20 Human immunodeficiency virus [HIV] disease: Secondary | ICD-10-CM

## 2014-08-08 DIAGNOSIS — Z79899 Other long term (current) drug therapy: Secondary | ICD-10-CM

## 2014-08-08 NOTE — Progress Notes (Signed)
HPI: [name] is a 48 y.o. male presenting to clinic today for a routine follow-up visit for HIV.  Pt reports not missing any doses of HIV meds since last office visit.  Allergies: Allergies  Allergen Reactions  . Contrast Media [Iodinated Diagnostic Agents] Hives and Itching  . Acetaminophen-Codeine Nausea And Vomiting  . Oxycodone-Acetaminophen Nausea And Vomiting  . Penicillins Hives    Vitals: Temp: 98.9 F (37.2 C) (11/10 1439) Temp Source: Oral (11/10 1439) BP: 94/63 mmHg (11/10 1439) Pulse Rate: 97 (11/10 1439)  Past Medical History: Past Medical History  Diagnosis Date  . HIV infection   . DJD (degenerative joint disease)     right knee  . Tertiary syphilis   . Genital herpes   . Anal condyloma   . Family history of disseminated HSV infection   . Shortness of breath     "can happen at anytime" (03/04/2013)  . ESRD (end stage renal disease) on dialysis 01/02/2012    [facility] on [address], TTS schedule.  Started dialysis in 2000.     Marland Kitchen. 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 Reclotted on 03/02/13 and IR placed L IJ tunneled HD catheter (a right external jugular tunneled HD cath was attempted first but did not function due to kinking across the clavicle). Pt was seen by [facility] on same admission 6/614 and said RUA AVF no longer usable, they ordered vein mapping and will see him as outpatient to set up next permanent access.     . Hypertension   . Pneumonia     hx of  . Diarrhea     occurs because of dialysis  . Hemodialysis patient     Tuesday, Thursday, Saturday    Social History: History   Social History  . Marital Status: Divorced    Spouse Name: N/A    Number of Children: N/A  . Years of Education: N/A   Social History Main Topics  . Smoking status: Current Every Day Smoker -- 0.50 packs/day for 27 years    Types: Cigarettes  . Smokeless tobacco: Never Used   Comment: cutting back from last visit  . Alcohol Use: 0.0 oz/week    0 Not specified per week     Comment: 03/04/2013 "stopped drinking ~ 12 yr ago; never had problem w/it"  . Drug Use: No  . Sexual Activity:    Partners: Female     Comment: declined condoms   Other Topics Concern  . None   Social History Narrative    Current Regimen: Prezista, lamivudine, ritonavir, tenofovir  Labs: HIV 1 RNA QUANT (copies/mL)  Date Value  07/20/2014 <20  07/26/2013 115*  06/09/2013 2440*   CD4 T CELL ABS (/uL)  Date Value  07/20/2014 1030  07/26/2013 550  06/09/2013 560   HEP B S AB (no units)  Date Value  11/23/2006 No   HEPATITIS B SURFACE AG (no units)  Date Value  08/26/2008 NEGATIVE   HCV AB (no units)  Date Value  11/23/2006 No    CrCl: CrCl cannot be calculated (Unknown ideal weight.).  Lipids:    Component Value Date/Time   CHOL 224* 07/20/2014 1506   TRIG 260* 07/20/2014 1506   HDL 44 07/20/2014 1506   CHOLHDL 5.1 07/20/2014 1506   VLDL 52* 07/20/2014 1506   LDLCALC 128* 07/20/2014 1506    Assessment: HIV is now well-controlled on current regimen w/  VL undetectable and CD4 count > 1000.  Pt appears to have improved compliance with HIV meds.  Recommendations: D/C Flonase d/t drug interaction with Prezista and ritonavir - I have now removed Flonase from his med list.  Pt reported very infrequent use of Flonase and has not used in the past ~6 months.  Counseled patient on drug interaction and instructed him not to use Flonase for allergy symptoms.  May consider using oral antihistamines (cetirizine, loratadine) or alternate nasal steroid (beclomethasone, flunisolide, or mometasone nasal sprays) if required in the future.  Donelle Hise, David Stallegan K, PharmD Clinical Infectious Disease Pharmacist [facility] 08/08/2014, 2:59 PM

## 2014-08-08 NOTE — Progress Notes (Signed)
Patient ID: Caleb BaldingJeffrey E [name], male   DOB: [birth year], 48 y.o.   MRN: [id number]          Patient Active Problem List   Diagnosis Date Noted  . ESRD (end stage renal disease) on dialysis 01/02/2012    Priority: High  . Human immunodeficiency virus (HIV) disease 10/19/2006    Priority: High  . CIGARETTE SMOKER 10/19/2006    Priority: High  . Viral URI with cough 09/13/2013  . Diarrhea 05/09/2013  . Right ear pain 04/19/2013  . 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 PERIPHERAL NEUROPATHY 04/10/2009  . BACTEREMIA, MYCOBACTERIUM AVIUM COMPLEX 10/19/2006  . HERPES, GENITAL NEC 10/19/2006  . WARTS, OTHER SPECIFIED VIRAL 10/19/2006  . SYPHILIS NOS 10/19/2006  . DEPRESSION 10/19/2006    Patient's Medications  New Prescriptions   No medications on file  Previous Medications   ALBUTEROL (PROVENTIL HFA;VENTOLIN HFA) 108 (90 BASE) MCG/ACT INHALER    Inhale 1-2 puffs into the lungs every 6 (six) hours as needed for wheezing or shortness of breath.   DARUNAVIR ETHANOLATE (PREZISTA) 800 MG TABLET    Take 1 tablet (800 mg total) by mouth daily with breakfast.   FLUTICASONE (FLONASE) 50 MCG/ACT NASAL SPRAY    Place 2 sprays into the nose daily as needed for rhinitis.   LAMIVUDINE (EPIVIR) 10 MG/ML SOLUTION    Take 0.5 mLs (5 mg total) by mouth at bedtime.   RITONAVIR (NORVIR) 100 MG TABS TABLET    Take 1 tablet (100 mg total) by mouth daily.   TENOFOVIR (VIREAD) 300 MG TABLET    Take 1 tablet (300 mg total) by mouth once a week. On Sundays   VALACYCLOVIR (VALTREX) 500 MG TABLET    Take 500 mg by mouth daily as needed (herpes outbreaks).   Modified Medications   No medications on file  Discontinued Medications   No medications on file    Subjective: [name] is in for his routine visit. He has not missed any of his medications since his last visit. Review of Systems: Pertinent items are noted in HPI.  Past Medical History  Diagnosis Date  . HIV infection   . DJD  (degenerative joint disease)     right knee  . Tertiary syphilis   . Genital herpes   . Anal condyloma   . Family history of disseminated HSV infection   . Shortness of breath     "can happen at anytime" (03/04/2013)  . ESRD (end stage renal disease) on dialysis 01/02/2012    G[facility]aysis [facility] on [address], TTS schedule.  Started dialysis in 2000.     Marland Kitchen. 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 Reclotted on 03/02/13 and IR placed L IJ tunneled HD catheter (a right external jugular tunneled HD cath was attempted first but did not function due to kinking across the clavicle). Pt was seen by [facility] on same admission 6/614 and said RUA AVF no longer usable, they ordered vein mapping and will see him as outpatient to set up next permanent access.     . Hypertension   . Pneumonia     hx of  . Diarrhea     occurs because of dialysis  . Hemodialysis patient     Tuesday, Thursday, Saturday    History  Substance Use Topics  . Smoking status: Current Every Day Smoker -- 0.50 packs/day for 27 years    Types: Cigarettes  .  Smokeless tobacco: Never Used     Comment: cutting back from last visit  . Alcohol Use: 0.0 oz/week    0 Not specified per week     Comment: 03/04/2013 "stopped drinking ~ 12 yr ago; never had problem w/it"    Family History  Problem Relation Age of [place]  . Diabetes Mother   . Arthritis Father     Gout- Foot    Allergies  Allergen Reactions  . Contrast Media [Iodinated Diagnostic Agents] Hives and Itching  . Acetaminophen-Codeine Nausea And Vomiting  . Oxycodone-Acetaminophen Nausea And Vomiting  . Penicillins Hives    Objective: Temp: 98.9 F (37.2 C) (11/10 1439) Temp Source: Oral (11/10 1439) BP: 94/63 mmHg (11/10 1439) Pulse Rate: 97 (11/10 1439) Body mass index is 22.78 kg/(m^2).  General: he is in good spirits Oral: very poor dentition Skin: no rash Lungs: clear Cor: regular S1 and S2  with no murmur  Lab Results Lab Results  Component Value Date   WBC 5.8 07/20/2014   HGB 15.6 07/20/2014   HCT 45.4 07/20/2014   MCV 97.2 07/20/2014   PLT 95* 07/20/2014    Lab Results  Component Value Date   CREATININE 8.99* 07/20/2014   BUN 18 07/20/2014   NA 140 07/20/2014   K 4.7 07/20/2014   CL 98 07/20/2014   CO2 30 07/20/2014    Lab Results  Component Value Date   ALT 17 07/20/2014   AST 16 07/20/2014   ALKPHOS 170* 07/20/2014   BILITOT 0.7 07/20/2014    Lab Results  Component Value Date   CHOL 224* 07/20/2014   HDL 44 07/20/2014   LDLCALC 128* 07/20/2014   TRIG 260* 07/20/2014   CHOLHDL 5.1 07/20/2014    Lab Results HIV 1 RNA QUANT (copies/mL)  Date Value  07/20/2014 <20  07/26/2013 115*  06/09/2013 2440*   CD4 T CELL ABS (/uL)  Date Value  07/20/2014 1030  07/26/2013 550  06/09/2013 560     Assessment: His infection is now under perfect control due to his improved adherence.  Plan: 1. Continue current antiretroviral therapy 2. Cigarette cessation counseling provided 3. Follow-up after lab work in 6 months   Cliffton AstersJohn Armanie Ullmer, MD [facility] [phone] pager   [phone] cell 08/08/2014, 2:52 PM

## 2014-08-08 NOTE — Addendum Note (Signed)
Addended by: Waynette ButteryMAGSAM, Threasa Kinch K on: 08/08/2014 03:11 PM   Modules accepted: Orders, Medications

## 2014-08-20 ENCOUNTER — Emergency Department (HOSPITAL_COMMUNITY)
Admission: EM | Admit: 2014-08-20 | Discharge: 2014-08-20 | Disposition: A | Discharge status: Home / Self Care | Payer: Medicare Other | Attending: Emergency Medicine | Admitting: Emergency Medicine

## 2014-08-20 ENCOUNTER — Encounter (HOSPITAL_COMMUNITY): Payer: Self-pay | Admitting: Emergency Medicine

## 2014-08-20 DIAGNOSIS — Z8739 Personal history of other diseases of the musculoskeletal system and connective tissue: Secondary | ICD-10-CM | POA: Diagnosis not present

## 2014-08-20 DIAGNOSIS — K0889 Other specified disorders of teeth and supporting structures: Secondary | ICD-10-CM

## 2014-08-20 DIAGNOSIS — Z992 Dependence on renal dialysis: Secondary | ICD-10-CM | POA: Diagnosis not present

## 2014-08-20 DIAGNOSIS — N186 End stage renal disease: Secondary | ICD-10-CM | POA: Insufficient documentation

## 2014-08-20 DIAGNOSIS — Z21 Asymptomatic human immunodeficiency virus [HIV] infection status: Secondary | ICD-10-CM | POA: Insufficient documentation

## 2014-08-20 DIAGNOSIS — K029 Dental caries, unspecified: Secondary | ICD-10-CM | POA: Diagnosis not present

## 2014-08-20 DIAGNOSIS — Z8701 Personal history of pneumonia (recurrent): Secondary | ICD-10-CM | POA: Diagnosis not present

## 2014-08-20 DIAGNOSIS — Z720 Tobacco use: Secondary | ICD-10-CM | POA: Insufficient documentation

## 2014-08-20 DIAGNOSIS — I120 Hypertensive chronic kidney disease with stage 5 chronic kidney disease or end stage renal disease: Secondary | ICD-10-CM | POA: Diagnosis not present

## 2014-08-20 DIAGNOSIS — Z8619 Personal history of other infectious and parasitic diseases: Secondary | ICD-10-CM | POA: Insufficient documentation

## 2014-08-20 DIAGNOSIS — Z880 Allergy status to penicillin: Secondary | ICD-10-CM | POA: Diagnosis not present

## 2014-08-20 DIAGNOSIS — K088 Other specified disorders of teeth and supporting structures: Secondary | ICD-10-CM | POA: Diagnosis present

## 2014-08-20 DIAGNOSIS — Z79899 Other long term (current) drug therapy: Secondary | ICD-10-CM | POA: Diagnosis not present

## 2014-08-20 MED ORDER — HYDROCODONE-ACETAMINOPHEN 5-325 MG PO TABS
1.0000 | ORAL_TABLET | Freq: Once | ORAL | Status: AC
  Administered 2014-08-20: 1 via ORAL
  Filled 2014-08-20: qty 1

## 2014-08-20 MED ORDER — CLINDAMYCIN HCL 150 MG PO CAPS: 300.0000 mg | ORAL_CAPSULE | Freq: Three times a day (TID) | ORAL | Status: DC

## 2014-08-20 MED ORDER — CLINDAMYCIN HCL 300 MG PO CAPS
300.0000 mg | ORAL_CAPSULE | Freq: Once | ORAL | Status: AC
  Administered 2014-08-20: 300 mg via ORAL
  Filled 2014-08-20: qty 1

## 2014-08-20 MED ORDER — BUPIVACAINE-EPINEPHRINE (PF) 0.5% -1:200000 IJ SOLN
1.8000 mL | Freq: Once | INTRAMUSCULAR | Status: AC
  Administered 2014-08-20: 1.8 mL
  Filled 2014-08-20 (×2): qty 1.8

## 2014-08-20 MED ORDER — ONDANSETRON 4 MG PO TBDP
4.0000 mg | ORAL_TABLET | Freq: Once | ORAL | Status: AC
  Administered 2014-08-20: 4 mg via ORAL
  Filled 2014-08-20: qty 1

## 2014-08-20 MED ORDER — HYDROCODONE-ACETAMINOPHEN 5-325 MG PO TABS: 1.0000 | ORAL_TABLET | Freq: Four times a day (QID) | ORAL | Status: DC | PRN

## 2014-08-20 NOTE — ED Notes (Addendum)
Pt c/o dental pain, L front tooth, since yesterday. Pt sts part of it chipped off awhile ago but it really started hurting yesterday. Pt has had problems with same tooth in the past. Pt sts he had a dentist appointment on the 18th but had to miss it because he had surgery on his arm.  Pt c/o 10/10 pain.

## 2014-08-20 NOTE — Discharge Instructions (Signed)
Dental Pain °A tooth ache may be caused by cavities (tooth decay). Cavities expose the nerve of the tooth to air and hot or cold temperatures. It may come from an infection or abscess (also called a boil or furuncle) around your tooth. It is also often caused by dental caries (tooth decay). This causes the pain you are having. °DIAGNOSIS  °Your caregiver can diagnose this problem by exam. °TREATMENT  °· If caused by an infection, it may be treated with medications which kill germs (antibiotics) and pain medications as prescribed by your caregiver. Take medications as directed. °· Only take over-the-counter or prescription medicines for pain, discomfort, or fever as directed by your caregiver. °· Whether the tooth ache today is caused by infection or dental disease, you should see your dentist as soon as possible for further care. °SEEK MEDICAL CARE IF: °The exam and treatment you received today has been provided on an emergency basis only. This is not a substitute for complete medical or dental care. If your problem worsens or new problems (symptoms) appear, and you are unable to meet with your dentist, call or return to this location. °SEEK IMMEDIATE MEDICAL CARE IF:  °· You have a fever. °· You develop redness and swelling of your face, jaw, or neck. °· You are unable to open your mouth. °· You have severe pain uncontrolled by pain medicine. °MAKE SURE YOU:  °· Understand these instructions. °· Will watch your condition. °· Will get help right away if you are not doing well or get worse. °Document Released: 09/15/2005 Document Revised: 12/08/2011 Document Reviewed: 05/03/2008 °ExitCare® Patient Information ©2015 [employer]. This information is not intended to replace advice given to you by your health care provider. Make sure you discuss any questions you have with your health care provider. ° °Emergency Department Resource Guide °1) Find a Doctor and Pay Out of Pocket °Although you won't have to find out who  is covered by your insurance plan, it is a good idea to ask around and get recommendations. You will then need to call the office and see if the doctor you have chosen will accept you as a new patient and what types of options they offer for patients who are self-pay. Some doctors offer discounts or will set up payment plans for their patients who do not have insurance, but you will need to ask so you aren't surprised when you get to your appointment. ° °2) Contact Your Local Health Department °Not all health departments have doctors that can see patients for sick visits, but many do, so it is worth a call to see if yours does. If you don't know where your local health department is, you can check in your phone book. The CDC also has a tool to help you locate your state's health department, and many state websites also have listings of all of their local health departments. ° °3) Find a Walk-in Clinic °If your illness is not likely to be very severe or complicated, you may want to try a walk in clinic. These are popping up all over the country in pharmacies, drugstores, and shopping centers. They're usually staffed by nurse practitioners or physician assistants that have been trained to treat common illnesses and complaints. They're usually fairly quick and inexpensive. However, if you have serious medical issues or chronic medical problems, these are probably not your best option. ° °No Primary Care Doctor: °- Call Health Connect at  832-8000 - they can help you locate a primary   care doctor that  accepts your insurance, provides certain services, etc. °- Physician Referral Service- 1-800-533-3463 ° °Chronic Pain Problems: °Organization         Address  Phone   Notes  °[facility]  (336) 297-2271 Patients need to be referred by their primary care doctor.  ° °Medication Assistance: °Organization         Address  Phone   Notes  °[place] [facility] [address]  [address] °[place], [place] [address] (336) 641-8030 --Must be a resident of [place] °-- Must have NO insurance coverage whatsoever (no Medicaid/ Medicare, etc.) °-- The pt. MUST have a primary care doctor that directs their care regularly and follows them in the community °  °[facility]  (866) 331-1348   °United Way  (888) 892-1162   ° °Agencies that provide inexpensive medical care: °Organization         Address  Phone   Notes  °[facility]  (336) 832-8035   °[facility]    (336) 832-7272   °[facility] [address] °[place], [place] [address] (336) 832-4777   °[facility] [address], °[place] (336) 271-4999   °[facility]    (336) 373-0678   °[facility]    (336) 272-1050   °[facility] ° [address], [place] Phone:  (336) 832-4444, Fax:  (336) 832-4440 Hours of Operation:  9 am - 6 pm, M-F.  Also accepts Medicaid/Medicare and self-pay.  °[facility] ° [address], [place] Phone: (336) 832-3150, Fax: (336) 832-3151. Hours of Operation:  8:30 am - 5:30 pm, M-F.  Also accepts Medicaid and self-pay.  °[facility] [place] [address], [place] Phone: (336) 878-6027   °[facility] [address], [place], [place] (336)723-1848, Ext. 123 Mondays & Thursdays: 7-9 AM.  First 15 patients are seen on a first come, first serve basis. °  ° °Medicaid-accepting [place] Providers: ° °Organization         Address  Phone   Notes  °[facility] [address], [place] (336) 641-2100 Also accepts self-pay patients.  °[facility] [address], [place] ° (336) 856-9996   °[facility] [address], [place] (336) 288-8857   °[facility] [address] [place] [address], [place] (336) 299-7000   °Veita Bland [address], [place]  ° (336) 373-1557 Only accepts [place] Access Medicaid patients after they have their name applied to their card.  ° °Self-Pay (no insurance) in [place]: ° °Organization         Address  Phone   Notes  °Sickle Cell Patients, [facility] [address], [place] (336) 832-1970   °[facility] [address], [place] (336) 832-4400   °[facility] ° [address], [place] (336) 992-4800   °[facility]/Dr. Osei-Bonsu ° [address] [place] [address], [place] or [address], [place] (336) 841-8500 Phone number for both [place] and [place] locations is the same.  °[facility] [address], [place] (336) 299-0000   °[facility] [address] [place] [address], [place] or [address] (336) 852-7530 °(336) 878-2260   °  [facility] [address], [place] (336) 350-1642, phone; (336) 294-5005, fax Sees patients 1st and 3rd Saturday of every month.  Must not qualify for public or private insurance (i.e. Medicaid, Medicare, [place] Health Choice, Veterans' Benefits) • Household income should be no more than 200% of the poverty level •The clinic cannot treat you if you are pregnant or think you are pregnant • Sexually transmitted diseases are not treated at the clinic.  ° ° °Dental Care: °Organization         Address  Phone  Notes  °[facility] [facility] [address], [place] (336) 641-6152 Accepts children up to age 21 who are enrolled in Medicaid or [place] Health Choice; pregnant women with a Medicaid card; and children who have applied for Medicaid or [place] Health Choice, but were declined, whose parents can pay a reduced fee at time of service.  °[facility] [place]  [address], [place] (336) 641-7733 Accepts children up to age 21 who are  enrolled in Medicaid or [place] Health Choice; pregnant women with a Medicaid card; and children who have applied for Medicaid or [place] Health Choice, but were declined, whose parents can pay a reduced fee at time of service.  °[facility] ° [address],  (336) 641-4533 Patients are seen by appointment only. Walk-ins are not accepted. [facility] will see patients 18 years of age and older. °Monday - Tuesday (8am-5pm) °Most Wednesdays (8:30-5pm) °$30 per visit, cash only  °[facility] ° [address], [place] (336) 641-4533 Patients are seen by appointment only. Walk-ins are not accepted. [facility] will see patients 18 years of age and older. °One Wednesday Evening (Monthly: Volunteer Based).  $30 per visit, cash only  °[facility]  (919) 537-3737 for adults; Children under age 4, call Graduate Pediatric Dentistry at ([phone]) [phone]. Children aged 4-14, please call ([phone]) [phone] to request a pediatric application. ° Dental services are provided in all areas of dental care including fillings, crowns and bridges, complete and partial dentures, implants, gum treatment, root canals, and extractions. Preventive care is also provided. Treatment is provided to both adults and children. °Patients are selected via a lottery and there is often a waiting list. °  °[facility] [address], ° ° (336) 763-8833 [email or web address] °  °[facility] [address], [place], [place] (336)723-1848, Ext. 123 Second and Fourth Thursday of each month, opens at 6:30 AM; Clinic ends at 9 AM.  Patients are seen on a first-come first-served basis, and a limited number are seen during each clinic.  ° °[facility] ° [address], [place], [place] (336) 723-7904   Eligibility Requirements °You must have lived in [place], [place], or [place] counties for at least the last three months. °  You  cannot be eligible for state or federal sponsored healthcare insurance, including Veterans Administration, Medicaid, or Medicare. °  You generally cannot be eligible for healthcare insurance through your employer.  °  How to apply: °Eligibility screenings are held every Tuesday and Wednesday afternoon from 1:00 pm until 4:00 pm. You do not need an appointment for the interview!  °[facility] [address], [place], [place] 336-631-2330   °[facility] [facility]  336-342-8273   °[facility]  336-703-3100   °[facility]  336-570-6415   ° °Behavioral Health Resources in the Community: °Intensive Outpatient Programs °Organization         Address  Phone  Notes  °[facility] [address], [place], [place] 336-878-6098   °[facility] [address], [place], [place] 336-832-9800   °[facility] [address], [place], [place] ° 336-882-2125   °[facility] [address],  °[place], [place] 1-800-853-5163 or 336-641-4981   °Substance Abuse Resources °Organization         Address  Phone  Notes  °[facility]  336-882-2125   °[facility]  336-784-9470   °[facility]  336-285-9073   °[facility]  336-845-3988   °Residential & Outpatient Substance Abuse Program  1-800-659-3381   °Psychological Services °Organization         Address  Phone  Notes  °[facility]  336- 832-9600   °[facility]  336- 378-7881   °[facility] [address], [place] 1-800-853-5163 or 336-641-4981   ° °Mobile Crisis Teams °Organization         Address  Phone  Notes  °[facility], Mobile Crisis Care Unit  1-877-626-1772   °Assertive °[facility] ° [address] [place], [place] 336-834-9664   °Sharon DeEsch [address] °[place] 336-554-5454   ° °Self-Help/Support  Groups °Organization         Address  Phone             Notes  °[facility] [place] - variety of support groups  336- 373-1402 Call for more information  °Narcotics Anonymous (NA), [facility] [address], °[place]  2 meetings at this location  ° °Residential Treatment Programs °Organization         Address  Phone  Notes  °[facility] [address],    °[place]  1-866-801-8205   °[facility] ° [address], [place], [place] 704-293-8524   °[facility] [address], [place] 336-845-3988 Admissions: 8am-3pm M-F  °[facility] [address]    °[place], [place] 336-841-1104   °[facility] [address], [place], [place] 336-379-7146   °[facility] [address]  °[place], [place] 336-285-9073   °[facility] - Intensive Outpatient [address], [place], [place] 336-852-3033   °[facility] [address]  °[place], [place] 1-877-615-2722 or 336-784-9470   °[facility] [address] [place], [place] 336-227-7417 Accepts Medicaid  °[facility] [address]  ° [place] 1-800-659-3381 Substance Abuse/Addiction Treatment  ° °[place] Behavioral Health Resources °Organization         Address  Phone  Notes  °[facility]  (888) 581-9988   °Julie Brannon, PhD [address] [place], [place]   (336) 349-5553 or (336) 951-0000   °[facility]   [address] °[place], [place] (336) 349-4454   °[facility] [address], [place],  (336) 342-8316 Insurance/Medicaid/sponsorship through [facility]  °[facility] [address]                                    [place],  (336) 342-8316 Therapy/tele-psych/case  °[facility]   [address]  ° [place], [place] (336) 349-2233    °Dr. Arfeen  (336) 349-4544   °[facility] [place]  [employer] [place] [facility] 1) [address], [place] °2) [address], [place] °3)  [address], [place] (336) 349-3220 °(336) 342-7768 ° °(336) 342-8140   °[place] Child Abuse Hotline (336) 342-1394 or (336) 342-3537 (After Hours)    ° ° ° °

## 2014-08-20 NOTE — ED Provider Notes (Signed)
CSN: [id number]     Arrival date & time 08/20/14  2024 History  This chart was scribed for non-physician practitioner, Antony Madura, PA-C working with Enid Skeens, MD by Greggory Stallion, ED scribe. This patient was seen in room WTR7/WTR7 and the patient's care was started at 9:27 PM.   Chief Complaint  Patient presents with  . Dental Pain   The history is provided by the patient. No language interpreter was used.    HPI Comments: [name] is a 48 y.o. male who presents to the Emergency Department complaining of worsening left frontal dental pain that started yesterday. Describes pain as aching, throbbing and sharp. Denies trauma or injury to mouth but states part of his tooth chipped off several weeks ago. Pt had an appointment on 08/16/14 with his ID doctor to have his teeth looked at but states he had to miss it because he had surgery on his arm. States they are supposed to call him tomorrow to reschedule. He has taken acetaminophen and used Orajel with no relief. His last dose of tylenol was about 4 hours ago. Denies fever, facial swelling, trismus.   Past Medical History  Diagnosis Date  . HIV infection   . DJD (degenerative joint disease)     right knee  . Tertiary syphilis   . Genital herpes   . Anal condyloma   . Family history of disseminated HSV infection   . Shortness of breath     "can happen at anytime" (03/04/2013)  . ESRD (end stage renal disease) on dialysis 01/02/2012    Gets diaysis at [facility] on [employer], TTS schedule.  Started dialysis in 2000.     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 Reclotted on 03/02/13 and IR placed L IJ tunneled HD catheter (a right external jugular tunneled HD cath was attempted first but did not function due to kinking across the clavicle). Pt was seen by [facility] on same admission 6/614 and said RUA AVF no longer usable, they ordered vein mapping and will see him as  outpatient to set up next permanent access.     . Hypertension   . Pneumonia     hx of  . Diarrhea     occurs because of dialysis  . Hemodialysis patient     Tuesday, Thursday, Saturday   Past Surgical History  Procedure Laterality Date  . Av fistula placement, brachiocephalic Right 06/16/11  . Appendectomy    . Tonsillectomy    . Cholecystectomy    . Incision and drainage abscess      abdominal  . Av fistula placement Right 06/16/11  . Revison of arteriovenous fistula Right 04/20/2013    Procedure: INSERTION OF ARTERIOVENOUS GORTEX GRAFT;  Surgeon: Sherren Kerns, MD;  Location: [facility] OR;  Service: Vascular;  Laterality: Right;  Ultrasound Guided   Family History  Problem Relation Age of Onset  . Diabetes Mother   . Arthritis Father     Gout- Foot   History  Substance Use Topics  . Smoking status: Current Every Day Smoker -- 0.50 packs/day for 27 years    Types: Cigarettes  . Smokeless tobacco: Never Used     Comment: cutting back from last visit  . Alcohol Use: 0.0 oz/week    0 Not specified per week     Comment: 03/04/2013 "stopped drinking ~ 12 yr ago; never had problem w/it"    Review  of Systems  Constitutional: Negative for fever.  HENT: Positive for dental problem. Negative for facial swelling.   All other systems reviewed and are negative.   Allergies  Contrast media; Acetaminophen-codeine; Oxycodone-acetaminophen; and Penicillins  Home Medications   Prior to Admission medications   Medication Sig Start Date End Date Taking? Authorizing Provider  Darunavir Ethanolate (PREZISTA) 800 MG tablet Take 1 tablet (800 mg total) by mouth daily with breakfast. Patient taking differently: Take 800 mg by mouth at bedtime.  07/26/14  Yes Gardiner Barefootobert W Comer, MD  lamiVUDine (EPIVIR) 10 MG/ML solution Take 0.5 mLs (5 mg total) by mouth at bedtime. 07/26/14  Yes Gardiner Barefootobert W Comer, MD  ritonavir (NORVIR) 100 MG TABS tablet Take 1 tablet (100 mg total) by mouth daily. 07/26/14  Yes  Gardiner Barefootobert W Comer, MD  tenofovir (VIREAD) 300 MG tablet Take 1 tablet (300 mg total) by mouth once a week. On Sundays 07/26/14  Yes Gardiner Barefootobert W Comer, MD  valACYclovir (VALTREX) 500 MG tablet Take 500 mg by mouth daily as needed (herpes outbreaks).    Yes Historical Provider, MD  albuterol (PROVENTIL HFA;VENTOLIN HFA) 108 (90 BASE) MCG/ACT inhaler Inhale 1-2 puffs into the lungs every 6 (six) hours as needed for wheezing or shortness of breath. 09/13/13   Purvis SheffieldForrest Harrison, MD  clindamycin (CLEOCIN) 150 MG capsule Take 2 capsules (300 mg total) by mouth 3 (three) times daily. May dispense as 150mg  capsules 08/20/14   Antony MaduraKelly Opha Mcghee, PA-C  HYDROcodone-acetaminophen (NORCO/VICODIN) 5-325 MG per tablet Take 1-2 tablets by mouth every 6 (six) hours as needed for moderate pain or severe pain. 08/20/14   Antony MaduraKelly Doyl Bitting, PA-C   BP 152/118 mmHg  Pulse 95  Resp 16  SpO2 99%   Physical Exam  Constitutional: He is oriented to person, place, and time. He appears well-developed and well-nourished. No distress.  Patient appears uncomfortable. He does not appear toxic or septic  HENT:  Head: Normocephalic and atraumatic.  Right Ear: External ear normal.  Left Ear: External ear normal.  Mouth/Throat: Uvula is midline, oropharynx is clear and moist and mucous membranes are normal. No oral lesions. No trismus in the jaw. Abnormal dentition. Dental caries present. No dental abscesses or uvula swelling.    Evidence of gross dental decay and caries. Tenderness to palpation to left upper lateral incisor. No trismus or stridor. Patient tolerating secretions without difficulty. Uvula midline.  Eyes: Conjunctivae and EOM are normal. No scleral icterus.  Neck: Normal range of motion. Neck supple.  Cardiovascular: Normal rate, regular rhythm and intact distal pulses.   Pulmonary/Chest: Effort normal. No respiratory distress.  Musculoskeletal: Normal range of motion.  Neurological: He is alert and oriented to person, place, and  time. He exhibits normal muscle tone. Coordination normal.  Skin: Skin is warm and dry. No rash noted. He is not diaphoretic. No erythema. No pallor.  Psychiatric: He has a normal mood and affect. His behavior is normal.  Nursing note and vitals reviewed.   ED Course  Dental Date/Time: 08/20/2014 10:11 PM Performed by: Antony MaduraHUMES, Lenton Gendreau Authorized by: Antony MaduraHUMES, Jalacia Mattila Consent: The procedure was performed in an emergent situation. Verbal consent obtained. Written consent not obtained. Risks and benefits: risks, benefits and alternatives were discussed Consent given by: patient Patient understanding: patient states understanding of the procedure being performed Patient consent: the patient's understanding of the procedure matches consent given Procedure consent: procedure consent matches procedure scheduled Relevant documents: relevant documents present and verified Test results: test results available and properly labeled Site marked:  the operative site was marked Imaging studies: imaging studies available Required items: required blood products, implants, devices, and special equipment available Patient identity confirmed: verbally with patient and arm band Time out: Immediately prior to procedure a "time out" was called to verify the correct patient, procedure, equipment, support staff and site/side marked as required. Local anesthesia used: yes Anesthesia: local infiltration Local anesthetic: bupivacaine 0.5% with epinephrine Anesthetic total: 1.5 ml Patient sedated: no Patient tolerance: Patient tolerated the procedure well with no immediate complications   (including critical care time)  DIAGNOSTIC STUDIES: Oxygen Saturation is 99% on RA, normal by my interpretation.    COORDINATION OF CARE: 9:31 PM-Discussed treatment plan which includes dental block, pain medication and an antibiotic with pt at bedside and pt agreed to plan. Will give pt dental referrals and advised him to follow  up.   Labs Review Labs Reviewed - No data to display  Imaging Review No results found.   EKG Interpretation None      MDM   Final diagnoses:  Dentalgia    Patient with toothache.  No gross abscess.  Exam unconcerning for Ludwig's angina or spread of infection. Will treat with clindamycin and pain medicine. Urged patient to follow-up with dentist. Referral and resource guide provided. Patient agreeable to plan with no unaddressed concerns.  I personally performed the services described in this documentation, which was scribed in my presence. The recorded information has been reviewed and is accurate.   Filed Vitals:   08/20/14 2035 08/20/14 2211  BP: 152/118 117/50  Pulse: 95 88  Temp:  98.8 F (37.1 C)  TempSrc: Oral Oral  Resp: 16 16  SpO2: 99% 99%     Antony MaduraKelly Pranathi Winfree, PA-C 08/20/14 2214  Enid SkeensJoshua M Zavitz, MD 08/20/14 2337

## 2014-08-30 ENCOUNTER — Other Ambulatory Visit: Payer: Self-pay | Admitting: *Deleted

## 2014-08-30 DIAGNOSIS — B20 Human immunodeficiency virus [HIV] disease: Secondary | ICD-10-CM

## 2014-08-30 MED ORDER — TENOFOVIR DISOPROXIL FUMARATE 300 MG PO TABS: 300.0000 mg | ORAL_TABLET | ORAL | Status: DC

## 2014-08-30 MED ORDER — LAMIVUDINE 10 MG/ML PO SOLN: 5.0000 mg | Freq: Every day | ORAL | Status: DC

## 2014-08-30 MED ORDER — DARUNAVIR ETHANOLATE 800 MG PO TABS: 800.0000 mg | ORAL_TABLET | Freq: Every day | ORAL | Status: DC

## 2014-08-30 MED ORDER — RITONAVIR 100 MG PO TABS: 100.0000 mg | ORAL_TABLET | Freq: Every day | ORAL | Status: DC

## 2014-09-01 MED ORDER — LAMIVUDINE 10 MG/ML PO SOLN: 50.0000 mg | Freq: Every day | ORAL | Status: DC

## 2014-10-10 ENCOUNTER — Other Ambulatory Visit: Payer: Self-pay | Admitting: *Deleted

## 2014-10-10 DIAGNOSIS — B20 Human immunodeficiency virus [HIV] disease: Secondary | ICD-10-CM

## 2014-10-10 MED ORDER — DARUNAVIR ETHANOLATE 800 MG PO TABS: 800.0000 mg | ORAL_TABLET | Freq: Every day | ORAL | Status: DC

## 2014-10-10 MED ORDER — RITONAVIR 100 MG PO TABS: 100.0000 mg | ORAL_TABLET | Freq: Every day | ORAL | Status: DC

## 2014-10-10 MED ORDER — TENOFOVIR DISOPROXIL FUMARATE 300 MG PO TABS: 300.0000 mg | ORAL_TABLET | ORAL | Status: DC

## 2014-10-10 NOTE — Telephone Encounter (Signed)
ADAP Application 

## 2014-10-31 ENCOUNTER — Telehealth: Payer: Self-pay | Admitting: *Deleted

## 2014-10-31 NOTE — Telephone Encounter (Signed)
Patient called, asking for a dental appointment. [facility] Andree CossHowell, Michelle M, RN

## 2014-11-27 ENCOUNTER — Other Ambulatory Visit: Payer: Medicare Other

## 2014-11-30 ENCOUNTER — Other Ambulatory Visit: Payer: Medicare Other

## 2014-12-04 ENCOUNTER — Other Ambulatory Visit: Payer: Medicare Other

## 2014-12-12 ENCOUNTER — Ambulatory Visit: Payer: Medicare Other | Admitting: Internal Medicine

## 2015-02-28 ENCOUNTER — Encounter (HOSPITAL_COMMUNITY): Payer: Self-pay | Admitting: Emergency Medicine

## 2015-02-28 ENCOUNTER — Emergency Department (HOSPITAL_COMMUNITY)
Admission: EM | Admit: 2015-02-28 | Discharge: 2015-02-28 | Disposition: A | Discharge status: Home / Self Care | Payer: Medicare Other | Attending: Emergency Medicine | Admitting: Emergency Medicine

## 2015-02-28 DIAGNOSIS — Z79899 Other long term (current) drug therapy: Secondary | ICD-10-CM | POA: Insufficient documentation

## 2015-02-28 DIAGNOSIS — L02416 Cutaneous abscess of left lower limb: Secondary | ICD-10-CM | POA: Diagnosis present

## 2015-02-28 DIAGNOSIS — Z880 Allergy status to penicillin: Secondary | ICD-10-CM | POA: Diagnosis not present

## 2015-02-28 DIAGNOSIS — Z8739 Personal history of other diseases of the musculoskeletal system and connective tissue: Secondary | ICD-10-CM | POA: Diagnosis not present

## 2015-02-28 DIAGNOSIS — Z992 Dependence on renal dialysis: Secondary | ICD-10-CM | POA: Diagnosis not present

## 2015-02-28 DIAGNOSIS — Z8701 Personal history of pneumonia (recurrent): Secondary | ICD-10-CM | POA: Diagnosis not present

## 2015-02-28 DIAGNOSIS — Z8619 Personal history of other infectious and parasitic diseases: Secondary | ICD-10-CM | POA: Insufficient documentation

## 2015-02-28 DIAGNOSIS — Z720 Tobacco use: Secondary | ICD-10-CM | POA: Diagnosis not present

## 2015-02-28 DIAGNOSIS — Z21 Asymptomatic human immunodeficiency virus [HIV] infection status: Secondary | ICD-10-CM | POA: Insufficient documentation

## 2015-02-28 DIAGNOSIS — L03116 Cellulitis of left lower limb: Secondary | ICD-10-CM | POA: Diagnosis not present

## 2015-02-28 DIAGNOSIS — N186 End stage renal disease: Secondary | ICD-10-CM | POA: Diagnosis not present

## 2015-02-28 DIAGNOSIS — I120 Hypertensive chronic kidney disease with stage 5 chronic kidney disease or end stage renal disease: Secondary | ICD-10-CM | POA: Diagnosis not present

## 2015-02-28 MED ORDER — DOXYCYCLINE HYCLATE 100 MG PO CAPS: 100.0000 mg | ORAL_CAPSULE | Freq: Two times a day (BID) | ORAL | Status: DC

## 2015-02-28 MED ORDER — HYDROCODONE-ACETAMINOPHEN 5-325 MG PO TABS: 1.0000 | ORAL_TABLET | Freq: Four times a day (QID) | ORAL | Status: DC | PRN

## 2015-02-28 MED ORDER — DOXYCYCLINE HYCLATE 100 MG PO TABS: 100.0000 mg | ORAL_TABLET | Freq: Two times a day (BID) | ORAL | Status: DC

## 2015-02-28 MED ORDER — DOXYCYCLINE HYCLATE 100 MG PO TABS
100.0000 mg | ORAL_TABLET | Freq: Once | ORAL | Status: AC
  Administered 2015-02-28: 100 mg via ORAL
  Filled 2015-02-28: qty 1

## 2015-02-28 NOTE — Discharge Instructions (Signed)
Keep wound clean and dry. Apply warm compresses to affected area throughout the day. Take antibiotic until it is finished. Take tylenol and motrin as needed for pain, and use norco as directed, as needed for severe pain but do not drive or operate machinery with pain medication use. [facility] for wound recheck. Monitor area for signs of infection to include, but not limited to: increasing pain, redness, drainage/pus, or swelling. Return to emergency department for emergent changing or worsening symptoms.   Cellulitis Cellulitis is an infection of the skin and the tissue under the skin. The infected area is usually red and tender. This happens most often in the arms and lower legs. HOME CARE   Take your antibiotic medicine as told. Finish the medicine even if you start to feel better.  Keep the infected arm or leg raised (elevated).  Put a warm cloth on the area up to 4 times per day.  Only take medicines as told by your doctor.  Keep all doctor visits as told. GET HELP IF:  You see red streaks on the skin coming from the infected area.  Your red area gets bigger or turns a dark color.  Your bone or joint under the infected area is painful after the skin heals.  Your infection comes back in the same area or different area.  You have a puffy (swollen) bump in the infected area.  You have new symptoms.  You have a fever. GET HELP RIGHT AWAY IF:   You feel very sleepy.  You throw up (vomit) or have watery poop (diarrhea).  You feel sick and have muscle aches and pains. MAKE SURE YOU:   Understand these instructions.  Will watch your condition.  Will get help right away if you are not doing well or get worse. Document Released: 03/03/2008 Document Revised: 01/30/2014 Document Reviewed: 12/01/2011 [facility] Patient Information 2015 [employer] [place]. This information is not intended to replace advice given to you by your  health care provider. Make sure you discuss any questions you have with your health care provider.

## 2015-02-28 NOTE — ED Notes (Signed)
Pt. reports skin abscess at right lower thigh onset 2 days ago with drainage .

## 2015-02-28 NOTE — ED Provider Notes (Signed)
CSN: [id number]     Arrival date & time 02/28/15  2119 History  This chart was scribed for [employer], PA-C, working with Vanetta Mulders, MD by Chestine Spore, ED Scribe. The patient was seen in room TR05C/TR05C at 9:46 PM.    Chief Complaint  Patient presents with  . Abscess      Patient is a 49 y.o. male presenting with abscess. The history is provided by the patient. No language interpreter was used.  Abscess Location:  Leg Leg abscess location:  L upper leg Abscess quality: draining, induration, painful and warmth   Abscess quality: no fluctuance and no redness   Red streaking: no   Duration:  2 days Progression:  Worsening Pain details:    Quality:  Sharp   Severity:  Moderate   Duration:  2 days   Timing:  Intermittent   Progression:  Unchanged Chronicity:  New Context: immunosuppression   Context: not diabetes, not injected drug use, not insect bite/sting and not skin injury   Relieved by:  None tried Exacerbated by: standing and touching. Ineffective treatments:  None tried Associated symptoms: no fever, no nausea and no vomiting   Risk factors: prior abscess    [name] is a 49 y.o. male with a medical hx of HIV infection, DJD, tertiary syphilis, HSV2, ESRD, and HTN, who presents to the Emergency department complaining of L anterior thigh abscess onset 2 days. He reports that clear drainage with blood came from the area earlier. Pt reports that pain is 11/10, intermittent with standing, aching, and it does not radiate. He states that touching and standing worsens his pain. He states that he has tried tylenol and ibuprofen with no relief for his symptoms. He states that he is having associated symptoms of warmth.  He denies red streaking, erythema, numbness, tingling, weakness, fevers, chills, CP, SOB abdominal pain, n/v/d, and any other symptoms. Denies insect bite or skin injuries. Denies IVDU. Pt reports that he goes to dialysis.   Past Medical  History  Diagnosis Date  . HIV infection   . DJD (degenerative joint disease)     right knee  . Tertiary syphilis   . Genital herpes   . Anal condyloma   . Family history of disseminated HSV infection   . Shortness of breath     "can happen at anytime" (03/04/2013)  . ESRD (end stage renal disease) on dialysis 01/02/2012    Gets diaysis at [facility] on [employer], TTS schedule.  Started dialysis in 2000.     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 Reclotted on 03/02/13 and IR placed L IJ tunneled HD catheter (a right external jugular tunneled HD cath was attempted first but did not function due to kinking across the clavicle). Pt was seen by [facility] on same admission 6/614 and said RUA AVF no longer usable, they ordered vein mapping and will see him as outpatient to set up next permanent access.     . Hypertension   . Pneumonia     hx of  . Diarrhea     occurs because of dialysis  . Hemodialysis patient     Tuesday, Thursday, Saturday   Past Surgical History  Procedure Laterality Date  . Av fistula placement, brachiocephalic Right 06/16/11  . Appendectomy    . Tonsillectomy    . Cholecystectomy    . Incision and drainage abscess  abdominal  . Av fistula placement Right 06/16/11  . Revison of arteriovenous fistula Right 04/20/2013    Procedure: INSERTION OF ARTERIOVENOUS GORTEX GRAFT;  Surgeon: Sherren Kerns, MD;  Location: [facility] OR;  Service: Vascular;  Laterality: Right;  Ultrasound Guided   Family History  Problem Relation Age of Onset  . Diabetes Mother   . Arthritis Father     Gout- Foot   History  Substance Use Topics  . Smoking status: Current Every Day Smoker -- 0.50 packs/day for 27 years    Types: Cigarettes  . Smokeless tobacco: Never Used     Comment: cutting back from last visit  . Alcohol Use: 0.0 oz/week    0 Standard drinks or equivalent per week     Comment: 03/04/2013 "stopped drinking ~ 12 yr  ago; never had problem w/it"    Review of Systems  Constitutional: Negative for fever and chills.  Respiratory: Negative for shortness of breath.   Cardiovascular: Negative for chest pain.  Gastrointestinal: Negative for nausea, vomiting, abdominal pain and diarrhea.  Musculoskeletal: Negative for joint swelling and arthralgias.  Skin: Positive for wound (abscess to the right thigh). Negative for color change.  Allergic/Immunologic: Positive for immunocompromised state.  Neurological: Negative for weakness and numbness.       No tingling  Hematological: Does not bruise/bleed easily.    A complete 10 system review of systems was obtained and all systems are negative except as noted in the HPI and PMH.    Allergies  Contrast media; Acetaminophen-codeine; Oxycodone-acetaminophen; and Penicillins  Home Medications   Prior to Admission medications   Medication Sig Start Date End Date Taking? Authorizing Provider  albuterol (PROVENTIL HFA;VENTOLIN HFA) 108 (90 BASE) MCG/ACT inhaler Inhale 1-2 puffs into the lungs every 6 (six) hours as needed for wheezing or shortness of breath. 09/13/13   Purvis Sheffield, MD  clindamycin (CLEOCIN) 150 MG capsule Take 2 capsules (300 mg total) by mouth 3 (three) times daily. May dispense as  capsules 08/20/14   Antony Madura, PA-C  Darunavir Ethanolate (PREZISTA) 800 MG tablet Take 1 tablet (800 mg total) by mouth daily with breakfast. 10/10/14   Gardiner Barefoot, MD  HYDROcodone-acetaminophen (NORCO/VICODIN) 5-325 MG per tablet Take 1-2 tablets by mouth every 6 (six) hours as needed for moderate pain or severe pain. 08/20/14   Antony Madura, PA-C  lamiVUDine (EPIVIR) 10 MG/ML solution Take 5 mLs (50 mg total) by mouth daily. 09/01/14   Cliffton [name], MD  ritonavir (NORVIR) 100 MG TABS tablet Take 1 tablet (100 mg total) by mouth daily. 10/10/14   Gardiner Barefoot, MD  tenofovir (VIREAD) 300 MG tablet Take 1 tablet (300 mg total) by mouth once a week. On Sundays  10/10/14   Gardiner Barefoot, MD  valACYclovir (VALTREX) 500 MG tablet Take 500 mg by mouth daily as needed (herpes outbreaks).     Historical Provider, MD   BP 111/63 mmHg  Pulse 91  Temp(Src) 98.5 F (36.9 C) (Oral)  Resp 20  SpO2 98% Physical Exam  Constitutional: He is oriented to person, place, and time. Vital signs are normal. He appears well-developed and well-nourished.  Non-toxic appearance. No distress.  Afebrile, nontoxic, NAD  HENT:  Head: Normocephalic and atraumatic.  Mouth/Throat: Mucous membranes are normal.  Eyes: Conjunctivae and EOM are normal. Right eye exhibits no discharge. Left eye exhibits no discharge.  Neck: Normal range of motion. Neck supple.  Cardiovascular: Normal rate and intact distal pulses.   Pulmonary/Chest: Effort normal.  No respiratory distress.  Abdominal: Normal appearance. He exhibits no distension.  Musculoskeletal: Normal range of motion.       Left upper leg: He exhibits tenderness (to area of cellulitis/abscess over L anterior thigh). He exhibits no bony tenderness and no edema.       Legs: MAE x4 Strength and sensation grossly intact Distal pulses intact No pedal edema L anterior thigh with small ~1cm area of induration with opening centrally and clear drainage, some slight surrounding erythema and trace warmth, no red streaking, no purulent drainage. Mildly TTP over this area but no other bony TTP. No fluctuance.  Neurological: He is alert and oriented to person, place, and time. He has normal strength. No sensory deficit.  Skin: Skin is warm and dry. No rash noted. There is erythema.  Indurated area over L anterior thigh as noted above  Psychiatric: He has a normal mood and affect.  Nursing note and vitals reviewed.   ED Course  Procedures (including critical care time) DIAGNOSTIC STUDIES: Oxygen Saturation is 98% on RA, nl by my interpretation.    COORDINATION OF CARE: 9:51 PM-Discussed treatment plan which includes abx Rx,  alternate tylenol and motrin, heat compresses, and f/u with PCP in 2 days with pt at bedside and pt agreed to plan.   Labs Review Labs Reviewed - No data to display  Imaging Review No results found.   EKG Interpretation None      MDM   Final diagnoses:  Cellulitis of left lower extremity    49 y.o. male here with L thigh lesion/cellulitis that drained clear liquid today. Mild erythema, slight warmth, mild induration, opening centrally in the lesion without purulent drainage. No fluctuance. Given his HIV status, will start abx but no I&D necessary today since no abscess formed. Will have him see PCP in 2 days for recheck to see if abscess forms. Discussed heat therapy. Will start on doxycycline. Pt requesting first dose here, and also concerned with not being able to afford his meds until Friday. Burna MortimerWanda of case management contacted and were able to get 2 doses of doxy sent home with pt. Given remainder of script for home use as well. Will give pain meds as well. I explained the diagnosis and have given explicit precautions to return to the ER including for any other new or worsening symptoms. The patient understands and accepts the medical plan as it's been dictated and I have answered their questions. Discharge instructions concerning home care and prescriptions have been given. The patient is STABLE and is discharged to home in good condition.   I personally performed the services described in this documentation, which was scribed in my presence. The recorded information has been reviewed and is accurate.  BP 111/63 mmHg  Pulse 91  Temp(Src) 98.5 F (36.9 C) (Oral)  Resp 20  SpO2 98%  Meds ordered this encounter  Medications  . doxycycline (VIBRA-TABS) tablet 100 mg    Sig:   . DISCONTD: doxycycline (VIBRA-TABS) tablet 100 mg    Sig:   . doxycycline (VIBRAMYCIN) 100 MG capsule    Sig: Take 1 capsule (100 mg total) by mouth 2 (two) times daily. One po bid x 2 doses    Dispense:   2 capsule    Refill:  0    Order Specific Question:  Supervising Provider    Answer:  MILLER, BRIAN [[id number]]  . doxycycline (VIBRAMYCIN) 100 MG capsule    Sig: Take 1 capsule (100 mg total) by mouth 2 (  two) times daily. One po bid x 7 days    Dispense:  14 capsule    Refill:  0    Order Specific Question:  Supervising Provider    Answer:  MILLER, BRIAN [[id number]]  . HYDROcodone-acetaminophen (NORCO) 5-325 MG per tablet    Sig: Take 1 tablet by mouth every 6 (six) hours as needed for severe pain.    Dispense:  6 tablet    Refill:  0    Order Specific Question:  Supervising Provider    Answer:  Eber Hong [[id number]]      Brenly Trawick Camprubi-Soms, PA-C 02/28/15 2246  Vanetta Mulders, MD 03/01/15 1702

## 2015-03-01 NOTE — Care Management (Signed)
ED CM consulted regarding medication needs. Patient evaluated in ED found to have an leg abscess, patient will be discharged on antibiotics but patient states he cannot afford co-pay until 6/3  he has Medicare insurance.  Met with patient at bedside he states, he will not have the funds until Friday. CM contacted Pilar Plate pharmacist in the inpatient pharmacy for assistance. Patient received the first does in the ED and given 2 capsules for  Tomorrows dose, and the prescription to have filled on Friday.  Patient agreeable with discharge plan. CM handed 2 capsules of doxycycline to take home with instructions. Paitent verbalized understanding teach back done.  Discussed with Loralee Pacas in [facility] agrees with discharge plan no further CM needs identified.

## 2015-06-05 ENCOUNTER — Telehealth: Payer: Self-pay | Admitting: *Deleted

## 2015-06-05 DIAGNOSIS — A6000 Herpesviral infection of urogenital system, unspecified: Secondary | ICD-10-CM

## 2015-06-05 MED ORDER — VALACYCLOVIR HCL 500 MG PO TABS: 500.0000 mg | ORAL_TABLET | Freq: Every day | ORAL | Status: DC | PRN

## 2015-06-05 NOTE — Telephone Encounter (Signed)
Pt requesting Valtrex refill.

## 2015-07-05 ENCOUNTER — Ambulatory Visit (INDEPENDENT_AMBULATORY_CARE_PROVIDER_SITE_OTHER): Payer: Medicare Other | Admitting: Internal Medicine

## 2015-07-05 ENCOUNTER — Encounter: Payer: Self-pay | Admitting: Internal Medicine

## 2015-07-05 VITALS — BP 159/85 | HR 77 | Temp 98.1°F | Wt 159.0 lb

## 2015-07-05 DIAGNOSIS — F17200 Nicotine dependence, unspecified, uncomplicated: Secondary | ICD-10-CM | POA: Diagnosis not present

## 2015-07-05 DIAGNOSIS — L0292 Furuncle, unspecified: Secondary | ICD-10-CM | POA: Diagnosis not present

## 2015-07-05 DIAGNOSIS — B20 Human immunodeficiency virus [HIV] disease: Secondary | ICD-10-CM | POA: Diagnosis not present

## 2015-07-05 DIAGNOSIS — Z79899 Other long term (current) drug therapy: Secondary | ICD-10-CM | POA: Diagnosis not present

## 2015-07-05 LAB — CBC
HEMATOCRIT: 35.8 % — AB (ref 39.0–52.0)
Hemoglobin: 12.6 g/dL — ABNORMAL LOW (ref 13.0–17.0)
MCH: 33.2 pg (ref 26.0–34.0)
MCHC: 35.2 g/dL (ref 30.0–36.0)
MCV: 94.5 fL (ref 78.0–100.0)
MPV: 9.6 fL (ref 8.6–12.4)
PLATELETS: 159 10*3/uL (ref 150–400)
RBC: 3.79 MIL/uL — ABNORMAL LOW (ref 4.22–5.81)
RDW: 13.7 % (ref 11.5–15.5)
WBC: 7.7 10*3/uL (ref 4.0–10.5)

## 2015-07-05 MED ORDER — DOXYCYCLINE HYCLATE 100 MG PO TABS: 100.0000 mg | ORAL_TABLET | Freq: Two times a day (BID) | ORAL | Status: DC

## 2015-07-05 NOTE — Assessment & Plan Note (Signed)
I talked to him again today about the importance of cigarette cessation. He currently has no desire to quit.

## 2015-07-05 NOTE — Assessment & Plan Note (Signed)
It sounds like his adherence remains good. His infection was under excellent control last November. I will continue his current regimen, repeat lab work today and encourage him to follow-up in 6 months.

## 2015-07-05 NOTE — Progress Notes (Signed)
Patient ID: [name], male   DOB: 12-30-1965, 49 y.o.   MRN: [id number]          Patient Active Problem List   Diagnosis Date Noted  . ESRD (end stage renal disease) on dialysis 01/02/2012    Priority: High  . Human immunodeficiency virus (HIV) disease (HCC) 10/19/2006    Priority: High  . CIGARETTE SMOKER 10/19/2006    Priority: High  . Boil 07/05/2015  . 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 PERIPHERAL NEUROPATHY 04/10/2009  . BACTEREMIA, MYCOBACTERIUM AVIUM COMPLEX 10/19/2006  . HERPES, GENITAL NEC 10/19/2006  . WARTS, OTHER SPECIFIED VIRAL 10/19/2006  . SYPHILIS NOS 10/19/2006  . DEPRESSION 10/19/2006    Patient's Medications  New Prescriptions   DOXYCYCLINE (VIBRA-TABS) 100 MG TABLET    Take 1 tablet (100 mg total) by mouth 2 (two) times daily.  Previous Medications   ALBUTEROL (PROVENTIL HFA;VENTOLIN HFA) 108 (90 BASE) MCG/ACT INHALER    Inhale 1-2 puffs into the lungs every 6 (six) hours as needed for wheezing or shortness of breath.   DARUNAVIR ETHANOLATE (PREZISTA) 800 MG TABLET    Take 1 tablet (800 mg total) by mouth daily with breakfast.   HYDROCODONE-ACETAMINOPHEN (NORCO) 5-325 MG PER TABLET    Take 1 tablet by mouth every 6 (six) hours as needed for severe pain.   LAMIVUDINE (EPIVIR) 10 MG/ML SOLUTION    Take 5 mLs (50 mg total) by mouth daily.   RITONAVIR (NORVIR) 100 MG TABS TABLET    Take 1 tablet (100 mg total) by mouth daily.   TENOFOVIR (VIREAD) 300 MG TABLET    Take 1 tablet (300 mg total) by mouth once a week. On Sundays   VALACYCLOVIR (VALTREX) 500 MG TABLET    Take 1 tablet (500 mg total) by mouth daily as needed (herpes outbreaks).  Modified Medications   No medications on file  Discontinued Medications   CLINDAMYCIN (CLEOCIN) 150 MG CAPSULE    Take 2 capsules (300 mg total) by mouth 3 (three) times daily. May dispense as 150mg  capsules   DOXYCYCLINE (VIBRAMYCIN) 100 MG CAPSULE    Take 1 capsule (100 mg total) by mouth 2 (two)  times daily. One po bid x 2 doses   DOXYCYCLINE (VIBRAMYCIN) 100 MG CAPSULE    Take 1 capsule (100 mg total) by mouth 2 (two) times daily. One po bid x 7 days   HYDROCODONE-ACETAMINOPHEN (NORCO/VICODIN) 5-325 MG PER TABLET    Take 1-2 tablets by mouth every 6 (six) hours as needed for moderate pain or severe pain.    Subjective: Acey is in for his first visit since November of last year. He knows the names of all of his medicines and describes taking them correctly. He denies missing doses. He recently developed a sore on the back of his neck. He wonders if he was bitten by a bug. The area is getting larger today and is moderately tender. He continues to smoke cigarettes and has no current plan to quit. He's been under a great deal of stress arguing with his landlord. He will be moving to a new apartment with his wife this weekend.   Review of Systems: Constitutional: negative Eyes: negative Ears, nose, mouth, throat, and face: negative Respiratory: negative Cardiovascular: negative Gastrointestinal: negative Genitourinary:negative Integument/breast: positive for as noted in history of present illness Musculoskeletal:negative Neurological: negative Behavioral/Psych: negative  Past Medical History  Diagnosis Date  . HIV infection (HCC)   . DJD (degenerative joint disease)  right knee  . Tertiary syphilis   . Genital herpes   . Anal condyloma   . Family history of disseminated HSV infection   . Shortness of breath     "can happen at anytime" (03/04/2013)  . ESRD (end stage renal disease) on dialysis (HCC) 01/02/2012    Gets diaysis at [facility] on [address], TTS schedule.  Started dialysis in 2000.     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 Reclotted on 03/02/13 and IR placed L IJ tunneled HD catheter (a right external jugular tunneled HD cath was attempted first but did not function due to kinking across the  clavicle). Pt was seen by [facility]VS on same admission 6/614 and said RUA AVF no longer usable, they ordered vein mapping and will see him as outpatient to set up next permanent access.     . Hypertension   . Pneumonia     hx of  . Diarrhea     occurs because of dialysis  . Hemodialysis patient [facility])     Tuesday, Thursday, Saturday    Social History  Substance Use Topics  . Smoking status: Current Every Day Smoker -- 0.50 packs/day for 27 years    Types: Cigarettes  . Smokeless tobacco: Never Used     Comment: cutting back from last visit  . Alcohol Use: 0.0 oz/week    0 Standard drinks or equivalent per week     Comment: 03/04/2013 "stopped drinking ~ 12 yr ago; never had problem w/it"    Family History  Problem Relation Age of Onset  . Diabetes Mother   . Arthritis Father     Gout- Foot    Allergies  Allergen Reactions  . Contrast Media [Iodinated Diagnostic Agents] Hives and Itching  . Acetaminophen-Codeine Nausea And Vomiting  . Oxycodone-Acetaminophen Nausea And Vomiting  . Penicillins Hives    Objective:  Filed Vitals:   07/05/15 1445  BP: 159/85  Pulse: 77  Temp: 98.1 F (36.7 C)  TempSrc: Oral  Weight: 159 lb (72.122 kg)   Body mass index is 22.19 kg/(m^2).  General: He is very talkative today. His weight is unchanged Oral: No oropharyngeal lesions. Many missing teeth Skin: He has a boil on his posterior neck with pinpoint drainage. There is no surrounding cellulitis. There is no fluctuance. Eyes: He has some corneal opacity medially in both eyes Lungs: Clear Cor: Regular S1 and S2 with no murmurs Abdomen: Soft and nontender with no palpable masses Joints and extremities: Right arm hemodialysis graft site appears normal Neuro: Alert with normal speech and conversation Mood: Normal. He does not appear anxious or depressed  Lab Results Lab Results  Component Value Date   WBC 5.8 07/20/2014   HGB 15.6 07/20/2014   HCT 45.4 07/20/2014   MCV 97.2 07/20/2014    PLT 95* 07/20/2014    Lab Results  Component Value Date   CREATININE 8.99* 07/20/2014   BUN 18 07/20/2014   NA 140 07/20/2014   K 4.7 07/20/2014   CL 98 07/20/2014   CO2 30 07/20/2014    Lab Results  Component Value Date   ALT 17 07/20/2014   AST 16 07/20/2014   ALKPHOS 170* 07/20/2014   BILITOT 0.7 07/20/2014    Lab Results  Component Value Date   CHOL 224* 07/20/2014   HDL 44 07/20/2014   LDLCALC 128* 07/20/2014   TRIG 260* 07/20/2014   CHOLHDL 5.1  07/20/2014    Lab Results HIV 1 RNA QUANT (copies/mL)  Date Value  07/20/2014 <20  07/26/2013 115*  06/09/2013 2440*   CD4 T CELL ABS (/uL)  Date Value  07/20/2014 1030  07/26/2013 550  06/09/2013 560     Problem List Items Addressed This Visit      High   CIGARETTE SMOKER    I talked to him again today about the importance of cigarette cessation. He currently has no desire to quit.      Human immunodeficiency virus (HIV) disease (HCC)    It sounds like his adherence remains good. His infection was under excellent control last November. I will continue his current regimen, repeat lab work today and encourage him to follow-up in 6 months.      Relevant Orders   T-helper cell (CD4)- ([facility] clinic only)   HIV 1 RNA quant-no reflex-bld   CBC   Comprehensive metabolic panel   RPR   Lipid panel     Unprioritized   Boil - Primary    He has a simple boil on his neck. I will treat him with doxycycline.      Relevant Medications   doxycycline (VIBRA-TABS) 100 MG tablet    Other Visit Diagnoses    Encounter for long-term (current) use of medications        Relevant Orders    Lipid panel         Cliffton [name], MD [facility] [phone] pager   [phone] cell 07/05/2015, 3:14 PM

## 2015-07-05 NOTE — Assessment & Plan Note (Signed)
He has a simple boil on his neck. I will treat him with doxycycline.

## 2015-07-06 LAB — COMPREHENSIVE METABOLIC PANEL
ALK PHOS: 143 U/L — AB (ref 40–115)
ALT: 8 U/L — AB (ref 9–46)
AST: 9 U/L — AB (ref 10–40)
Albumin: 3.9 g/dL (ref 3.6–5.1)
BILIRUBIN TOTAL: 1.1 mg/dL (ref 0.2–1.2)
BUN: 4 mg/dL — AB (ref 7–25)
CO2: 32 mmol/L — ABNORMAL HIGH (ref 20–31)
CREATININE: 5.05 mg/dL — AB (ref 0.60–1.35)
Calcium: 10.2 mg/dL (ref 8.6–10.3)
Chloride: 97 mmol/L — ABNORMAL LOW (ref 98–110)
GLUCOSE: 65 mg/dL (ref 65–99)
POTASSIUM: 4.9 mmol/L (ref 3.5–5.3)
Sodium: 136 mmol/L (ref 135–146)
TOTAL PROTEIN: 7.1 g/dL (ref 6.1–8.1)

## 2015-07-06 LAB — LIPID PANEL
Cholesterol: 188 mg/dL (ref 125–200)
HDL: 65 mg/dL (ref >=40)
LDL CALC: 112 mg/dL (ref <130)
Total CHOL/HDL Ratio: 2.9 Ratio (ref <=5.0)
Triglycerides: 53 mg/dL (ref <150)
VLDL: 11 mg/dL (ref <30)

## 2015-07-06 LAB — T-HELPER CELL (CD4) - (RCID CLINIC ONLY)
CD4 % Helper T Cell: 33 % (ref 33–55)
CD4 T Cell Abs: 920 /uL (ref 400–2700)

## 2015-07-06 LAB — RPR

## 2015-07-10 LAB — HIV-1 RNA QUANT-NO REFLEX-BLD
HIV 1 RNA Quant: <20 copies/mL (ref <20)
HIV-1 RNA Quant, Log: <1.3 {Log} (ref <1.30)

## 2015-07-31 ENCOUNTER — Other Ambulatory Visit: Payer: Self-pay | Admitting: Nephrology

## 2015-07-31 ENCOUNTER — Ambulatory Visit
Admission: RE | Admit: 2015-07-31 | Discharge: 2015-07-31 | Disposition: A | Discharge status: Home / Self Care | Payer: Medicare Other | Source: Ambulatory Visit | Attending: Nephrology | Admitting: Nephrology

## 2015-07-31 DIAGNOSIS — R509 Fever, unspecified: Secondary | ICD-10-CM

## 2015-07-31 DIAGNOSIS — R059 Cough, unspecified: Secondary | ICD-10-CM

## 2015-07-31 DIAGNOSIS — R05 Cough: Secondary | ICD-10-CM

## 2015-07-31 DIAGNOSIS — R6889 Other general symptoms and signs: Secondary | ICD-10-CM

## 2015-08-04 ENCOUNTER — Emergency Department (HOSPITAL_COMMUNITY)
Admission: EM | Admit: 2015-08-04 | Discharge: 2015-08-04 | Disposition: A | Discharge status: Home / Self Care | Payer: Medicare Other | Attending: Emergency Medicine | Admitting: Emergency Medicine

## 2015-08-04 DIAGNOSIS — Y658 Other specified misadventures during surgical and medical care: Secondary | ICD-10-CM | POA: Diagnosis not present

## 2015-08-04 DIAGNOSIS — Z880 Allergy status to penicillin: Secondary | ICD-10-CM | POA: Diagnosis not present

## 2015-08-04 DIAGNOSIS — T82838A Hemorrhage of vascular prosthetic devices, implants and grafts, initial encounter: Secondary | ICD-10-CM | POA: Insufficient documentation

## 2015-08-04 DIAGNOSIS — Z8619 Personal history of other infectious and parasitic diseases: Secondary | ICD-10-CM | POA: Insufficient documentation

## 2015-08-04 DIAGNOSIS — Z720 Tobacco use: Secondary | ICD-10-CM | POA: Diagnosis not present

## 2015-08-04 DIAGNOSIS — Z992 Dependence on renal dialysis: Secondary | ICD-10-CM | POA: Diagnosis not present

## 2015-08-04 DIAGNOSIS — I120 Hypertensive chronic kidney disease with stage 5 chronic kidney disease or end stage renal disease: Secondary | ICD-10-CM | POA: Diagnosis not present

## 2015-08-04 DIAGNOSIS — Z8701 Personal history of pneumonia (recurrent): Secondary | ICD-10-CM | POA: Insufficient documentation

## 2015-08-04 DIAGNOSIS — N186 End stage renal disease: Secondary | ICD-10-CM | POA: Insufficient documentation

## 2015-08-04 DIAGNOSIS — Z21 Asymptomatic human immunodeficiency virus [HIV] infection status: Secondary | ICD-10-CM | POA: Diagnosis not present

## 2015-08-04 DIAGNOSIS — T829XXA Unspecified complication of cardiac and vascular prosthetic device, implant and graft, initial encounter: Secondary | ICD-10-CM

## 2015-08-04 DIAGNOSIS — R58 Hemorrhage, not elsewhere classified: Secondary | ICD-10-CM

## 2015-08-04 LAB — BASIC METABOLIC PANEL
Anion gap: 10 (ref 5–15)
BUN: <5 mg/dL — ABNORMAL LOW (ref 6–20)
CO2: 31 mmol/L (ref 22–32)
Calcium: 9.7 mg/dL (ref 8.9–10.3)
Chloride: 97 mmol/L — ABNORMAL LOW (ref 101–111)
Creatinine, Ser: 5.05 mg/dL — ABNORMAL HIGH (ref 0.61–1.24)
GFR calc Af Amer: 14 mL/min — ABNORMAL LOW (ref >60)
GFR calc non Af Amer: 12 mL/min — ABNORMAL LOW (ref >60)
Glucose, Bld: 118 mg/dL — ABNORMAL HIGH (ref 65–99)
Potassium: 3.8 mmol/L (ref 3.5–5.1)
Sodium: 138 mmol/L (ref 135–145)

## 2015-08-04 LAB — CBC WITH DIFFERENTIAL/PLATELET
Basophils Absolute: 0 10*3/uL (ref 0.0–0.1)
Basophils Relative: 0 %
Eosinophils Absolute: 0 10*3/uL (ref 0.0–0.7)
Eosinophils Relative: 1 %
HCT: 31.7 % — ABNORMAL LOW (ref 39.0–52.0)
Hemoglobin: 10.2 g/dL — ABNORMAL LOW (ref 13.0–17.0)
Lymphocytes Relative: 52 %
Lymphs Abs: 1.2 10*3/uL (ref 0.7–4.0)
MCH: 31.9 pg (ref 26.0–34.0)
MCHC: 32.2 g/dL (ref 30.0–36.0)
MCV: 99.1 fL (ref 78.0–100.0)
Monocytes Absolute: 0.5 10*3/uL (ref 0.1–1.0)
Monocytes Relative: 20 %
Neutro Abs: 0.6 10*3/uL — ABNORMAL LOW (ref 1.7–7.7)
Neutrophils Relative %: 27 %
Platelets: 39 10*3/uL — ABNORMAL LOW (ref 150–400)
RBC: 3.2 MIL/uL — ABNORMAL LOW (ref 4.22–5.81)
RDW: 13.9 % (ref 11.5–15.5)
WBC: 2.3 10*3/uL — ABNORMAL LOW (ref 4.0–10.5)

## 2015-08-04 LAB — APTT: aPTT: 117 seconds — ABNORMAL HIGH (ref 24–37)

## 2015-08-04 LAB — PROTIME-INR
INR: 0.98 (ref 0.00–1.49)
Prothrombin Time: 13.1 seconds (ref 11.6–15.2)

## 2015-08-04 LAB — ABO/RH: ABO/RH(D): B POS

## 2015-08-04 LAB — TYPE AND SCREEN
ABO/RH(D): B POS
Antibody Screen: NEGATIVE

## 2015-08-04 NOTE — ED Provider Notes (Signed)
CSN: [id number]     Arrival date & time 08/04/15  1519 History   First MD Initiated Contact with Patient 08/04/15 1537     Chief Complaint  Patient presents with  . Bleeding/Bruising     (Consider location/radiation/quality/duration/timing/severity/associated sxs/prior Treatment) HPI   Garald BaldingJeffrey E Grange is a 49 y.o. male with PMH significant for HIV, DJD, ESRD on dialysis T, TH, Sat, and HTN who presents with uncontrolled bleeding from his AV fistula.  Patient states he has been bleeding from his graft since Tuesday.  It has been controlled with pressure.  He reports he was at dialysis today and after his treatment, approx 11 AM, the bleeding has not stopped with pressure.  He arrives via EMS today.  Denies SOB, dizziness, lightheadedness, or CP.  This has never happened before.  He reports he has had this graft for about 5 years.   Past Medical History  Diagnosis Date  . HIV infection (HCC)   . DJD (degenerative joint disease)     right knee  . Tertiary syphilis   . Genital herpes   . Anal condyloma   . Family history of disseminated HSV infection   . Shortness of breath     "can happen at anytime" (03/04/2013)  . ESRD (end stage renal disease) on dialysis (HCC) 01/02/2012    [facility] [address], TTS schedule.  Started dialysis in 2000.     Marland Kitchen. 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 Reclotted on 03/02/13 and IR placed L IJ tunneled HD catheter (a right external jugular tunneled HD cath was attempted first but did not function due to kinking across the clavicle). Pt was seen by [facility] on same admission 6/614 and said RUA AVF no longer usable, they ordered vein mapping and will see him as outpatient to set up next permanent access.     . Hypertension   . Pneumonia     hx of  . Diarrhea     occurs because of dialysis  . Hemodialysis patient [facility](HCC)     Tuesday, Thursday, Saturday   Past Surgical  History  Procedure Laterality Date  . Av fistula placement, brachiocephalic Right 06/16/11  . Appendectomy    . Tonsillectomy    . Cholecystectomy    . Incision and drainage abscess      abdominal  . Av fistula placement Right 06/16/11  . Revison of arteriovenous fistula Right 04/20/2013    Procedure: INSERTION OF ARTERIOVENOUS GORTEX GRAFT;  Surgeon: Sherren Kernsharles E Fields, MD;  [facility];  Service: Vascular;  Laterality: Right;  Ultrasound Guided   Family History  Problem Relation Age of Onset  . Diabetes Mother   . Arthritis Father     Gout- Foot   Social History  Substance Use Topics  . Smoking status: Current Every Day Smoker -- 0.50 packs/day for 27 years    Types: Cigarettes  . Smokeless tobacco: Never Used     Comment: cutting back from last visit  . Alcohol Use: 0.0 oz/week    0 Standard drinks or equivalent per week     Comment: 03/04/2013 "stopped drinking ~ 12 yr ago; never had problem w/it"    Review of Systems All other systems negative unless otherwise stated in HPI    Allergies  Contrast media; Acetaminophen-codeine; and Penicillins  Home Medications   Prior to Admission medications   Medication Sig Start Date End Date  Taking? Authorizing Provider  acetaminophen (TYLENOL) 650 MG CR tablet Take 1,300 mg by mouth every 6 (six) hours as needed for pain.   Yes Historical Provider, MD  albuterol (PROVENTIL HFA;VENTOLIN HFA) 108 (90 BASE) MCG/ACT inhaler Inhale 1-2 puffs into the lungs every 6 (six) hours as needed for wheezing or shortness of breath. 09/13/13  Yes Purvis Sheffield, MD  calcium acetate (PHOSLO) 667 MG capsule Take 2,668 mg by mouth See admin instructions. Take 4 capsules (2668 mg) by mouth with meals - once or twice daily   Yes Historical Provider, MD  Darunavir Ethanolate (PREZISTA) 800 MG tablet Take 1 tablet (800 mg total) by mouth daily with breakfast. Patient taking differently: Take 800 mg by mouth at bedtime.  10/10/14  Yes Gardiner Barefoot, MD    ritonavir (NORVIR) 100 MG TABS tablet Take 1 tablet (100 mg total) by mouth daily. Patient taking differently: Take 100 mg by mouth at bedtime.  10/10/14  Yes Gardiner Barefoot, MD  tenofovir (VIREAD) 300 MG tablet Take 1 tablet (300 mg total) by mouth once a week. On Sundays 10/10/14  Yes Gardiner Barefoot, MD  valACYclovir (VALTREX) 500 MG tablet Take 1 tablet (500 mg total) by mouth daily as needed (herpes outbreaks). 06/05/15  Yes Cliffton [name], MD  HYDROcodone-acetaminophen (NORCO) 5-325 MG per tablet Take 1 tablet by mouth every 6 (six) hours as needed for severe pain. Patient not taking: Reported on 08/04/2015 02/28/15   Mercedes Camprubi-Soms, PA-C  lamiVUDine (EPIVIR) 10 MG/ML solution Take 5 mLs (50 mg total) by mouth daily. Patient not taking: Reported on 08/04/2015 09/01/14   Cliffton [name], MD   BP 115/71 mmHg  Pulse 70  Temp(Src) 98 F (36.7 C) (Oral)  Resp 16  Ht  (1.803 m)  Wt 155 lb 10.3 oz (70.6 kg)  BMI 21.72 kg/m2  SpO2 98% Physical Exam  Constitutional: He is oriented to person, place, and time. He appears well-developed and well-nourished.  HENT:  Head: Normocephalic and atraumatic.  Mouth/Throat: Oropharynx is clear and moist.  Eyes: Conjunctivae are normal. Pupils are equal, round, and reactive to light.  Neck: Normal range of motion. Neck supple.  Cardiovascular: Normal rate, regular rhythm and normal heart sounds.   No murmur heard. Pulses:      Radial pulses are 2+ on the right side, and 2+ on the left side.  Pulmonary/Chest: Effort normal and breath sounds normal. No accessory muscle usage or stridor. No respiratory distress. He has no wheezes. He has no rhonchi. He has no rales.  Abdominal: Soft. Bowel sounds are normal. He exhibits no distension. There is no tenderness.  Musculoskeletal: Normal range of motion.       Arms: Lymphadenopathy:    He has no cervical adenopathy.  Neurological: He is alert and oriented to person, place, and time.  Speech clear  without dysarthria.  Skin: Skin is warm and dry.  Psychiatric: He has a normal mood and affect. His behavior is normal.    ED Course  Procedures (including critical care time) Labs Review Labs Reviewed  CBC WITH DIFFERENTIAL/PLATELET - Abnormal; Notable for the following:    WBC 2.3 (*)    RBC 3.20 (*)    Hemoglobin 10.2 (*)    HCT 31.7 (*)    Platelets 39 (*)    Neutro Abs 0.6 (*)    All other components within normal limits  BASIC METABOLIC PANEL - Abnormal; Notable for the following:    Chloride 97 (*)  Glucose, Bld 118 (*)    BUN <5 (*)    Creatinine, Ser 5.05 (*)    GFR calc non Af Amer 12 (*)    GFR calc Af Amer 14 (*)    All other components within normal limits  APTT - Abnormal; Notable for the following:    aPTT 117 (*)    All other components within normal limits  PROTIME-INR  TYPE AND SCREEN  ABO/RH    Imaging Review No results found. I have personally reviewed and evaluated these images and lab results as part of my medical decision-making.   EKG Interpretation None      MDM   Final diagnoses:  Bleeding  Complication of AV dialysis fistula, initial encounter    Patient with left arm AV fistula graft with persistent bleeding.  Will obtain type and screen, BMP, and CBC.  Will consult vascular surgery.  Per Dr. Arbie Cookey, will readdress site with 4 x 4 and a light pressure dressing that he will keep on until time for dialysis on Tuesday.  Will consult on call nephrologist concerning PLT count of 39 since this a new finding for him.   Evaluation does not show pathology requring ongoing emergent intervention or admission. Pt is hemodynamically stable and mentating appropriately. Discussed findings/results and plan with patient/guardian, who agrees with plan. All questions answered. Return precautions discussed and outpatient follow up given.   Case has been discussed with and seen by Dr. Denton Lank who agrees with the above plan for discharge.   Cheri Fowler,  PA-C 08/04/15 1939  Cathren Laine, MD 08/04/15 [phone]

## 2015-08-04 NOTE — ED Notes (Signed)
Vascular at bedside

## 2015-08-04 NOTE — Progress Notes (Signed)
Patient ID: Caleb BaldingJeffrey E Griffy, male   DOB: May 08, 1966, 49 y.o.   MRN: [id number] Patient presented to emergency room with persistent bleeding from his right upper arm access. I was asked to evaluate the patient since he continues to bleed despite pressure.  Of note his platelet count is in the 30,000 which is a new finding for him.  He did have a Coban dressing in place with no active bleeding. I removed this and the puncture site that he had bleeding from had no bleeding whatsoever. He had been having very nice rotation of the sites and the area that had been bleeding was in a site that has not been accessed. Of I did attempt to manipulate this to see if that would start bleeding again and it did not. The skin was completely intact over this. He will have this readdressed with 4 x 4 and a light pressure dressing that he will keep on until time for dialysis on Tuesday.

## 2015-08-04 NOTE — ED Notes (Signed)
Pt st's he had dialysis this am,  St's graft in right arm has been bleeding off and on since 11am today.

## 2015-08-04 NOTE — ED Notes (Signed)
Vascular MD at bedside.

## 2015-08-04 NOTE — Discharge Instructions (Signed)
AV Fistula, Care After Refer to this sheet in the next few weeks. These instructions provide you with information on caring for yourself after your procedure. Your caregiver may also give you more specific instructions. Your treatment has been planned according to current medical practices, but problems sometimes occur. Call your caregiver if you have any problems or questions after your procedure. HOME CARE INSTRUCTIONS   Do not drive a car or take public transportation alone.  Do not drink alcohol.  Only take medicine that has been prescribed by your caregiver.  Do not sign important papers or make important decisions.  Have a responsible person with you.  Ask your caregiver to show you how to check your access at home for a vibration (called a "thrill") or for a sound (called a "bruit" pronounced brew-ee).  Your vein will need time to enlarge and mature so needles can be inserted for dialysis. Follow your caregiver's instructions about what you need to do to make this happen.  Keep dressings clean and dry.  Keep the arm elevated above your heart. Use a pillow.  Rest.  Use the arm as usual for all activities.  Have the stitches or tape closures removed in 10 to 14 days, or as directed by your caregiver.  Do not sleep or lie on the area of the fistula or that arm. This may decrease or stop the blood flow through your fistula.  Do not allow blood pressures to be taken on this arm.  Do not allow blood drawing to be done from the graft.  Do not wear tight clothing around the access site or on the arm.  Avoid lifting heavy objects with the arm that has the fistula.  Do not use creams or lotions over the access site. SEEK MEDICAL CARE IF:   You have a fever.  You have swelling around the fistula that gets worse, or you have new pain.  You have unusual bleeding at the fistula site or from any other area.  You have pus or other drainage at the fistula site.  You have skin  redness or red streaking on the skin around, above, or below the fistula site.  Your access site feels warm.  You have any flu-like symptoms. SEEK IMMEDIATE MEDICAL CARE IF:   You have pain, numbness, or an unusual pale skin on the hand or on the side of your fistula.  You have dizziness or weakness that you have not had before.  You have shortness of breath.  You have chest pain.  Your fistula disconnects or breaks, and there is bleeding that cannot be easily controlled. Call for local emergency medical help. Do not try to drive yourself to the hospital. MAKE SURE YOU  Understand these instructions.  Will watch your condition.  Will get help right away if you are not doing well or get worse.   This information is not intended to replace advice given to you by your health care provider. Make sure you discuss any questions you have with your health care provider.   Document Released: 09/15/2005 Document Revised: 10/06/2014 Document Reviewed: 03/05/2011 Elsevier Interactive Patient Education 2016 [employer]  

## 2015-08-17 ENCOUNTER — Other Ambulatory Visit: Payer: Self-pay | Admitting: *Deleted

## 2015-08-17 DIAGNOSIS — B20 Human immunodeficiency virus [HIV] disease: Secondary | ICD-10-CM

## 2015-08-17 MED ORDER — LAMIVUDINE 10 MG/ML PO SOLN: 50.0000 mg | Freq: Every day | ORAL | Status: DC

## 2015-08-17 MED ORDER — TENOFOVIR DISOPROXIL FUMARATE 300 MG PO TABS: 300.0000 mg | ORAL_TABLET | ORAL | Status: DC

## 2015-08-17 MED ORDER — DARUNAVIR ETHANOLATE 800 MG PO TABS: 800.0000 mg | ORAL_TABLET | Freq: Every day | ORAL | Status: DC

## 2015-08-17 MED ORDER — RITONAVIR 100 MG PO TABS: 100.0000 mg | ORAL_TABLET | Freq: Every day | ORAL | Status: DC

## 2015-08-17 NOTE — Telephone Encounter (Signed)
Pt has applied for SPAP.  He may elect to have his HIV medicaitons shipped to [facility] from [facility] [address] [place].

## 2015-08-21 NOTE — Progress Notes (Signed)
Notified [facility] via fax. Secret Kristensen M, RN 

## 2015-08-22 ENCOUNTER — Ambulatory Visit: Payer: Medicare Other | Admitting: Internal Medicine

## 2015-09-06 ENCOUNTER — Other Ambulatory Visit: Payer: Self-pay | Admitting: Orthopedic Surgery

## 2015-09-06 DIAGNOSIS — M25512 Pain in left shoulder: Secondary | ICD-10-CM

## 2015-09-19 ENCOUNTER — Ambulatory Visit
Admission: RE | Admit: 2015-09-19 | Discharge: 2015-09-19 | Disposition: A | Discharge status: Home / Self Care | Payer: Medicare Other | Source: Ambulatory Visit | Attending: Orthopedic Surgery | Admitting: Orthopedic Surgery

## 2015-09-19 ENCOUNTER — Other Ambulatory Visit: Payer: Self-pay | Admitting: Orthopedic Surgery

## 2015-09-19 DIAGNOSIS — M25512 Pain in left shoulder: Secondary | ICD-10-CM

## 2015-09-27 ENCOUNTER — Ambulatory Visit
Admission: RE | Admit: 2015-09-27 | Discharge: 2015-09-27 | Disposition: A | Discharge status: Home / Self Care | Payer: Medicare Other | Source: Ambulatory Visit | Attending: Orthopedic Surgery | Admitting: Orthopedic Surgery

## 2015-09-27 DIAGNOSIS — M25512 Pain in left shoulder: Secondary | ICD-10-CM

## 2015-09-27 MED ORDER — IOHEXOL 180 MG/ML  SOLN
13.0000 mL | Freq: Once | INTRAMUSCULAR | Status: AC | PRN
Start: 2015-09-27 — End: 2015-09-27
  Administered 2015-09-27: 13 mL via INTRA_ARTICULAR

## 2015-10-13 ENCOUNTER — Inpatient Hospital Stay (HOSPITAL_COMMUNITY)
Admission: EM | Admit: 2015-10-13 | Discharge: 2015-10-17 | DRG: 974 | Disposition: A | Discharge status: Home / Self Care | Payer: Medicare Other | Attending: Family Medicine | Admitting: Family Medicine

## 2015-10-13 ENCOUNTER — Inpatient Hospital Stay (HOSPITAL_COMMUNITY): Payer: Medicare Other

## 2015-10-13 ENCOUNTER — Emergency Department (HOSPITAL_COMMUNITY): Payer: Medicare Other

## 2015-10-13 ENCOUNTER — Encounter (HOSPITAL_COMMUNITY): Payer: Self-pay | Admitting: Emergency Medicine

## 2015-10-13 DIAGNOSIS — D696 Thrombocytopenia, unspecified: Secondary | ICD-10-CM | POA: Diagnosis present

## 2015-10-13 DIAGNOSIS — J984 Other disorders of lung: Secondary | ICD-10-CM | POA: Diagnosis not present

## 2015-10-13 DIAGNOSIS — G629 Polyneuropathy, unspecified: Secondary | ICD-10-CM | POA: Diagnosis present

## 2015-10-13 DIAGNOSIS — E875 Hyperkalemia: Secondary | ICD-10-CM | POA: Diagnosis not present

## 2015-10-13 DIAGNOSIS — R042 Hemoptysis: Secondary | ICD-10-CM | POA: Diagnosis not present

## 2015-10-13 DIAGNOSIS — A529 Late syphilis, unspecified: Secondary | ICD-10-CM | POA: Diagnosis present

## 2015-10-13 DIAGNOSIS — D649 Anemia, unspecified: Secondary | ICD-10-CM | POA: Diagnosis present

## 2015-10-13 DIAGNOSIS — R0902 Hypoxemia: Secondary | ICD-10-CM | POA: Diagnosis not present

## 2015-10-13 DIAGNOSIS — Z21 Asymptomatic human immunodeficiency virus [HIV] infection status: Secondary | ICD-10-CM | POA: Diagnosis not present

## 2015-10-13 DIAGNOSIS — J159 Unspecified bacterial pneumonia: Principal | ICD-10-CM | POA: Diagnosis present

## 2015-10-13 DIAGNOSIS — K8020 Calculus of gallbladder without cholecystitis without obstruction: Secondary | ICD-10-CM | POA: Insufficient documentation

## 2015-10-13 DIAGNOSIS — R091 Pleurisy: Secondary | ICD-10-CM | POA: Diagnosis present

## 2015-10-13 DIAGNOSIS — B009 Herpesviral infection, unspecified: Secondary | ICD-10-CM | POA: Diagnosis present

## 2015-10-13 DIAGNOSIS — R918 Other nonspecific abnormal finding of lung field: Secondary | ICD-10-CM | POA: Diagnosis not present

## 2015-10-13 DIAGNOSIS — R0600 Dyspnea, unspecified: Secondary | ICD-10-CM | POA: Insufficient documentation

## 2015-10-13 DIAGNOSIS — N2581 Secondary hyperparathyroidism of renal origin: Secondary | ICD-10-CM | POA: Diagnosis present

## 2015-10-13 DIAGNOSIS — I120 Hypertensive chronic kidney disease with stage 5 chronic kidney disease or end stage renal disease: Secondary | ICD-10-CM | POA: Diagnosis present

## 2015-10-13 DIAGNOSIS — J9811 Atelectasis: Secondary | ICD-10-CM | POA: Diagnosis not present

## 2015-10-13 DIAGNOSIS — F17200 Nicotine dependence, unspecified, uncomplicated: Secondary | ICD-10-CM | POA: Diagnosis present

## 2015-10-13 DIAGNOSIS — R52 Pain, unspecified: Secondary | ICD-10-CM

## 2015-10-13 DIAGNOSIS — Z79899 Other long term (current) drug therapy: Secondary | ICD-10-CM | POA: Diagnosis not present

## 2015-10-13 DIAGNOSIS — N186 End stage renal disease: Secondary | ICD-10-CM | POA: Diagnosis present

## 2015-10-13 DIAGNOSIS — J9819 Other pulmonary collapse: Secondary | ICD-10-CM | POA: Diagnosis not present

## 2015-10-13 DIAGNOSIS — Z992 Dependence on renal dialysis: Secondary | ICD-10-CM

## 2015-10-13 DIAGNOSIS — R0602 Shortness of breath: Secondary | ICD-10-CM | POA: Insufficient documentation

## 2015-10-13 DIAGNOSIS — B20 Human immunodeficiency virus [HIV] disease: Secondary | ICD-10-CM | POA: Diagnosis present

## 2015-10-13 DIAGNOSIS — J181 Lobar pneumonia, unspecified organism: Secondary | ICD-10-CM

## 2015-10-13 DIAGNOSIS — J189 Pneumonia, unspecified organism: Secondary | ICD-10-CM | POA: Diagnosis not present

## 2015-10-13 DIAGNOSIS — F17210 Nicotine dependence, cigarettes, uncomplicated: Secondary | ICD-10-CM | POA: Diagnosis present

## 2015-10-13 DIAGNOSIS — R1011 Right upper quadrant pain: Secondary | ICD-10-CM | POA: Insufficient documentation

## 2015-10-13 DIAGNOSIS — M1711 Unilateral primary osteoarthritis, right knee: Secondary | ICD-10-CM | POA: Diagnosis present

## 2015-10-13 DIAGNOSIS — Z87891 Personal history of nicotine dependence: Secondary | ICD-10-CM | POA: Diagnosis not present

## 2015-10-13 LAB — I-STAT CG4 LACTIC ACID, ED
LACTIC ACID, VENOUS: 2.93 mmol/L — AB (ref 0.5–2.0)
Lactic Acid, Venous: 2.81 mmol/L (ref 0.5–2.0)

## 2015-10-13 LAB — I-STAT CHEM 8, ED
BUN: 36 mg/dL — AB (ref 6–20)
CHLORIDE: 100 mmol/L — AB (ref 101–111)
CREATININE: 10.9 mg/dL — AB (ref 0.61–1.24)
Calcium, Ion: 1.15 mmol/L (ref 1.12–1.23)
Glucose, Bld: 87 mg/dL (ref 65–99)
HCT: 47 % (ref 39.0–52.0)
Hemoglobin: 16 g/dL (ref 13.0–17.0)
POTASSIUM: 4.9 mmol/L (ref 3.5–5.1)
Sodium: 140 mmol/L (ref 135–145)
TCO2: 25 mmol/L (ref 0–100)

## 2015-10-13 LAB — CBC WITH DIFFERENTIAL/PLATELET
Basophils Absolute: 0 10*3/uL (ref 0.0–0.1)
Basophils Relative: 0 %
EOS ABS: 0 10*3/uL (ref 0.0–0.7)
Eosinophils Relative: 0 %
HCT: 41.5 % (ref 39.0–52.0)
HEMOGLOBIN: 13.5 g/dL (ref 13.0–17.0)
LYMPHS ABS: 1.9 10*3/uL (ref 0.7–4.0)
LYMPHS PCT: 17 %
MCH: 31.9 pg (ref 26.0–34.0)
MCHC: 32.5 g/dL (ref 30.0–36.0)
MCV: 98.1 fL (ref 78.0–100.0)
Monocytes Absolute: 0.6 10*3/uL (ref 0.1–1.0)
Monocytes Relative: 5 %
NEUTROS PCT: 78 %
Neutro Abs: 8.8 10*3/uL — ABNORMAL HIGH (ref 1.7–7.7)
Platelets: 101 10*3/uL — ABNORMAL LOW (ref 150–400)
RBC: 4.23 MIL/uL (ref 4.22–5.81)
RDW: 14.6 % (ref 11.5–15.5)
WBC: 11.3 10*3/uL — AB (ref 4.0–10.5)

## 2015-10-13 LAB — HEPATIC FUNCTION PANEL
ALT: 38 U/L (ref 17–63)
AST: 41 U/L (ref 15–41)
Albumin: 3.3 g/dL — ABNORMAL LOW (ref 3.5–5.0)
Alkaline Phosphatase: 245 U/L — ABNORMAL HIGH (ref 38–126)
BILIRUBIN DIRECT: 0.2 mg/dL (ref 0.1–0.5)
Indirect Bilirubin: 1.3 mg/dL — ABNORMAL HIGH (ref 0.3–0.9)
Total Bilirubin: 1.5 mg/dL — ABNORMAL HIGH (ref 0.3–1.2)
Total Protein: 7.6 g/dL (ref 6.5–8.1)

## 2015-10-13 LAB — I-STAT TROPONIN, ED: Troponin i, poc: 0.05 ng/mL (ref 0.00–0.08)

## 2015-10-13 LAB — BRAIN NATRIURETIC PEPTIDE: B NATRIURETIC PEPTIDE 5: 190.7 pg/mL — AB (ref 0.0–100.0)

## 2015-10-13 LAB — GAMMA GT: GGT: 283 U/L — ABNORMAL HIGH (ref 7–50)

## 2015-10-13 LAB — LACTIC ACID, PLASMA: Lactic Acid, Venous: 4.7 mmol/L (ref 0.5–2.0)

## 2015-10-13 LAB — D-DIMER, QUANTITATIVE (NOT AT ARMC): D DIMER QUANT: 3.67 ug{FEU}/mL — AB (ref 0.00–0.50)

## 2015-10-13 MED ORDER — SODIUM CHLORIDE 0.9 % IV SOLN
125.0000 mg | INTRAVENOUS | Status: DC
  Filled 2015-10-13: qty 10

## 2015-10-13 MED ORDER — LIDOCAINE-PRILOCAINE 2.5-2.5 % EX CREA: 1.0000 "application " | TOPICAL_CREAM | CUTANEOUS | Status: DC | PRN

## 2015-10-13 MED ORDER — SODIUM CHLORIDE 0.9 % IV BOLUS (SEPSIS)
500.0000 mL | Freq: Once | INTRAVENOUS | Status: AC
  Administered 2015-10-13: 500 mL via INTRAVENOUS

## 2015-10-13 MED ORDER — LEVOFLOXACIN IN D5W 500 MG/100ML IV SOLN: 500.0000 mg | INTRAVENOUS | Status: DC

## 2015-10-13 MED ORDER — HEPARIN SODIUM (PORCINE) 1000 UNIT/ML DIALYSIS: 1000.0000 [IU] | INTRAMUSCULAR | Status: DC | PRN

## 2015-10-13 MED ORDER — LEVOFLOXACIN IN D5W 750 MG/150ML IV SOLN
750.0000 mg | Freq: Once | INTRAVENOUS | Status: AC
Start: 2015-10-13 — End: 2015-10-13
  Administered 2015-10-13: 750 mg via INTRAVENOUS
  Filled 2015-10-13: qty 150

## 2015-10-13 MED ORDER — ACETAMINOPHEN 325 MG PO TABS
650.0000 mg | ORAL_TABLET | Freq: Once | ORAL | Status: AC
  Administered 2015-10-13: 650 mg via ORAL
  Filled 2015-10-13: qty 2

## 2015-10-13 MED ORDER — DOXERCALCIFEROL 4 MCG/2ML IV SOLN
1.0000 ug | INTRAVENOUS | Status: DC
  Filled 2015-10-13: qty 2

## 2015-10-13 MED ORDER — MORPHINE SULFATE (PF) 2 MG/ML IV SOLN
INTRAVENOUS | Status: AC
  Administered 2015-10-13: 1 mg via INTRAVENOUS
  Filled 2015-10-13: qty 1

## 2015-10-13 MED ORDER — SODIUM CHLORIDE 0.9 % IV BOLUS (SEPSIS)
500.0000 mL | Freq: Once | INTRAVENOUS | Status: AC
  Administered 2015-10-14: 500 mL via INTRAVENOUS

## 2015-10-13 MED ORDER — MORPHINE SULFATE (PF) 2 MG/ML IV SOLN
1.0000 mg | INTRAVENOUS | Status: DC | PRN
  Administered 2015-10-13 – 2015-10-14 (×4): 1 mg via INTRAVENOUS
  Filled 2015-10-13 (×3): qty 1

## 2015-10-13 MED ORDER — SODIUM CHLORIDE 0.9 % IV SOLN: 100.0000 mL | INTRAVENOUS | Status: DC | PRN

## 2015-10-13 MED ORDER — ALTEPLASE 2 MG IJ SOLR: 2.0000 mg | Freq: Once | INTRAMUSCULAR | Status: DC | PRN

## 2015-10-13 MED ORDER — ALBUTEROL SULFATE HFA 108 (90 BASE) MCG/ACT IN AERS
1.0000 | INHALATION_SPRAY | RESPIRATORY_TRACT | Status: DC | PRN
  Filled 2015-10-13: qty 6.7

## 2015-10-13 MED ORDER — LIDOCAINE HCL (PF) 1 % IJ SOLN: 5.0000 mL | INTRAMUSCULAR | Status: DC | PRN

## 2015-10-13 MED ORDER — PENTAFLUOROPROP-TETRAFLUOROETH EX AERO
1.0000 | INHALATION_SPRAY | CUTANEOUS | Status: DC | PRN
Start: 2015-10-13 — End: 2015-10-14

## 2015-10-13 NOTE — ED Provider Notes (Signed)
CSN: [id number]     Arrival date & time 10/13/15  0948 History   First MD Initiated Contact with Patient 10/13/15 [phone]     Chief Complaint  Patient presents with  . Shortness of Breath  . Flank Pain     (Consider location/radiation/quality/duration/timing/severity/associated sxs/prior Treatment) Patient is a 50 y.o. male presenting with shortness of breath and flank pain. The history is provided by the patient (Patient states that he has had a cough with yellow sputum production for over week he's complaining of worsening shortness of breath. He was post current dialysis today but he did not).  Shortness of Breath Severity:  Moderate Onset quality:  Gradual Timing:  Constant Progression:  Worsening Chronicity:  New Context: activity   Associated symptoms: cough   Associated symptoms: no abdominal pain, no chest pain, no headaches and no rash   Flank Pain Associated symptoms include shortness of breath. Pertinent negatives include no chest pain, no abdominal pain and no headaches.    Past Medical History  Diagnosis Date  . HIV infection (HCC)   . DJD (degenerative joint disease)     right knee  . Tertiary syphilis   . Genital herpes   . Anal condyloma   . Family history of disseminated HSV infection   . Shortness of breath     "can happen at anytime" (03/04/2013)  . ESRD (end stage renal disease) on dialysis (HCC) 01/02/2012    Gets diaysis at [facility] on [employer], TTS schedule.  Started dialysis in 2000.     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 Reclotted on 03/02/13 and IR placed L IJ tunneled HD catheter (a right external jugular tunneled HD cath was attempted first but did not function due to kinking across the clavicle). Pt was seen by VVS on same admission 6/614 and said RUA AVF no longer usable, they ordered vein mapping and will see him as outpatient to set up next permanent access.     .  Hypertension   . Pneumonia     hx of  . Diarrhea     occurs because of dialysis  . Hemodialysis patient [facility])     Tuesday, Thursday, Saturday   Past Surgical History  Procedure Laterality Date  . Av fistula placement, brachiocephalic Right 06/16/11  . Appendectomy    . Tonsillectomy    . Cholecystectomy    . Incision and drainage abscess      abdominal  . Av fistula placement Right 06/16/11  . Revison of arteriovenous fistula Right 04/20/2013    Procedure: INSERTION OF ARTERIOVENOUS GORTEX GRAFT;  Surgeon: Sherren Kerns, MD;  Location: [facility] OR;  Service: Vascular;  Laterality: Right;  Ultrasound Guided   Family History  Problem Relation Age of Onset  . Diabetes Mother   . Arthritis Father     Gout- Foot   Social History  Substance Use Topics  . Smoking status: Current Every Day Smoker -- 0.50 packs/day for 27 years    Types: Cigarettes  . Smokeless tobacco: Never Used     Comment: cutting back from last visit  . Alcohol Use: 0.0 oz/week    0 Standard drinks or equivalent per week     Comment: 03/04/2013 "stopped drinking ~ 12 yr ago; never had problem w/it"    Review of Systems  Constitutional: Negative for appetite change and fatigue.  HENT: Negative for congestion, ear discharge and sinus pressure.  Eyes: Negative for discharge.  Respiratory: Positive for cough and shortness of breath.   Cardiovascular: Negative for chest pain.  Gastrointestinal: Negative for abdominal pain and diarrhea.  Genitourinary: Positive for flank pain. Negative for frequency and hematuria.  Musculoskeletal: Negative for back pain.  Skin: Negative for rash.  Neurological: Negative for seizures and headaches.  Psychiatric/Behavioral: Negative for hallucinations.      Allergies  Contrast media; Acetaminophen-codeine; and Penicillins  Home Medications   Prior to Admission medications   Medication Sig Start Date End Date Taking? Authorizing Provider  acetaminophen (TYLENOL) 650 MG CR  tablet Take 1,300 mg by mouth every 6 (six) hours as needed for pain.   Yes Historical Provider, MD  albuterol (PROVENTIL HFA;VENTOLIN HFA) 108 (90 BASE) MCG/ACT inhaler Inhale 1-2 puffs into the lungs every 6 (six) hours as needed for wheezing or shortness of breath. 09/13/13  Yes Purvis SheffieldForrest Harrison, MD  calcium acetate (PHOSLO) 667 MG capsule Take 2,668 mg by mouth See admin instructions. Take 4 capsules (2668 mg) by mouth with meals - once or twice daily   Yes Historical Provider, MD  Darunavir Ethanolate (PREZISTA) 800 MG tablet Take 1 tablet (800 mg total) by mouth at bedtime. Take with Norvir tablet. 08/17/15  Yes Cliffton AstersJohn Campbell, MD  ritonavir (NORVIR) 100 MG TABS tablet Take 1 tablet (100 mg total) by mouth at bedtime. Take with Prezista tablet. 08/17/15  Yes Cliffton AstersJohn Campbell, MD  tenofovir (VIREAD) 300 MG tablet Take 1 tablet (300 mg total) by mouth once a week. On Sundays Patient taking differently: Take 300 mg by mouth every Sunday. On Sundays 08/17/15  Yes Cliffton AstersJohn Campbell, MD  valACYclovir (VALTREX) 500 MG tablet Take 1 tablet (500 mg total) by mouth daily as needed (herpes outbreaks). 06/05/15  Yes Cliffton AstersJohn Campbell, MD  HYDROcodone-acetaminophen (NORCO) 5-325 MG per tablet Take 1 tablet by mouth every 6 (six) hours as needed for severe pain. Patient not taking: Reported on 08/04/2015 02/28/15   Mercedes Camprubi-Soms, PA-C  lamiVUDine (EPIVIR) 10 MG/ML solution Take 5 mLs (50 mg total) by mouth daily. Patient not taking: Reported on 10/13/2015 08/17/15   Cliffton AstersJohn Campbell, MD   BP 119/51 mmHg  Pulse 107  Temp(Src) 98.1 F (36.7 C) (Axillary)  Resp 26  Ht 5\' 11"  (1.803 m)  Wt 145 lb 8.1 oz (66 kg)  BMI 20.30 kg/m2  SpO2 98% Physical Exam  Constitutional: He is oriented to person, place, and time. He appears well-developed.  HENT:  Head: Normocephalic.  Eyes: Conjunctivae and EOM are normal. No scleral icterus.  Neck: Neck supple. No thyromegaly present.  Cardiovascular: Normal rate and regular  rhythm.  Exam reveals no gallop and no friction rub.   No murmur heard. Pulmonary/Chest: No stridor. He has no wheezes. He has no rales. He exhibits no tenderness.  Abdominal: He exhibits no distension. There is no tenderness. There is no rebound.  Musculoskeletal: Normal range of motion. He exhibits no edema.  Lymphadenopathy:    He has no cervical adenopathy.  Neurological: He is oriented to person, place, and time. He exhibits normal muscle tone. Coordination normal.  Skin: No rash noted. No erythema.  Psychiatric: He has a normal mood and affect. His behavior is normal.    ED Course  Procedures (including critical care time) Labs Review Labs Reviewed  CBC WITH DIFFERENTIAL/PLATELET - Abnormal; Notable for the following:    WBC 11.3 (*)    Platelets 101 (*)    Neutro Abs 8.8 (*)    All other components  within normal limits  BRAIN NATRIURETIC PEPTIDE - Abnormal; Notable for the following:    B Natriuretic Peptide 190.7 (*)    All other components within normal limits  HEPATIC FUNCTION PANEL - Abnormal; Notable for the following:    Albumin 3.3 (*)    Alkaline Phosphatase 245 (*)    Total Bilirubin 1.5 (*)    Indirect Bilirubin 1.3 (*)    All other components within normal limits  I-STAT CHEM 8, ED - Abnormal; Notable for the following:    Chloride 100 (*)    BUN 36 (*)    Creatinine, Ser 10.90 (*)    All other components within normal limits  I-STAT CG4 LACTIC ACID, ED - Abnormal; Notable for the following:    Lactic Acid, Venous 2.81 (*)    All other components within normal limits  CULTURE, BLOOD (ROUTINE X 2)  CULTURE, BLOOD (ROUTINE X 2)  I-STAT TROPOININ, ED    Imaging Review Dg Chest Port 1 View  10/13/2015  CLINICAL DATA:  Short of breath today EXAM: PORTABLE CHEST 1 VIEW COMPARISON:  07/31/2015 FINDINGS: Hazy airspace disease has developed at the right lung base. Normal heart size. Left lung is clear. No pneumothorax. Left axillary vascular stent in place.  IMPRESSION: Right basilar airspace disease worrisome for pneumonia. Followup PA and lateral chest X-ray is recommended in 3-4 weeks following trial of antibiotic therapy to ensure resolution and exclude underlying malignancy. Electronically Signed   By: Jolaine Click M.D.   On: 10/13/2015 10:27   I have personally reviewed and evaluated these images and lab results as part of my medical decision-making.   EKG Interpretation None      MDM   Final diagnoses:  Community acquired pneumonia      Patient with community-acquired pneumonia will be admitted to medicine for IV antibiotics. Patient will also see nephrology for possible dialysis  Bethann Berkshire, MD 10/13/15 1128

## 2015-10-13 NOTE — H&P (Signed)
[name] Admission History and Physical Service Pager: [phone]  Patient name: [name] Medical record number: [id number] Date of birth: [birth year] Age: 50 y.o. Gender: male  Primary Care Provider: Donetta Potts, MD Consultants: nephrology Code Status: full (obtained on admission)  Chief Complaint: cough and shortness of breath  Assessment and Plan: [name] is a 50 y.o. male presenting with shortness of breath, chest pain . PMH is significant for HIV on HAART, ESRD on HD (TTHSA), HSV, Smoking, Depression, peripheral neuropathy.  Cough/Dyspnea: acute worsening of his dyspnea, cough and new hemoptysis is concerning for pneumonia. CXR significant for right basilar airspace disease worrisome for pneumonia. Unlikley to be PCP with CD4 of 920. There is no objective fever. WBC at 11. Lactic acid mildly elevated to 2.8. PE is a possibility especially with his recent surgical procedure on his HD cath, on 1/13 per report. Wells score 4 placing him at moderate risk for PE, d-dimer 3.67. Despite reported subjective SOB, patient is sating in upper 90's to 100%. However, PE risk stratifying testing, are concerning for potential PE and warrant further workup. He has been prescribed albuterol in past, but does not carry a diagnosis of asthma -Admit to FPTS. Attending Dr. Nori Riis - will order VQ scan, if high risk for PE, will treat -Continue Levaquin at 750 mg dosing per pharm taking into account ESRD -Trend lactic acid -oxygen as needed  - continue home albuterol as needed  Abdominal pain/Diarrhea: Potentially gastroenteritis with reported diarrhea and emesis at home. His lactic acid is also 2.8. Others on differential are MSK, psychogenic abdominal pain, cholelithiasis, cholecystitis or liver abscess. Alk phos elevated but no transaminitis. Low suspicion for ileus or SBO with diarrhea. Patient is tender to light palpation. Mostly over his right  upper and lower quadrant. He is also tender over back on the right side with right CVA tenderness. He has history of abdominal abscess in the past.  -CT abdomen and pelvis without contrast -Follow up GGT -Tylenol for pain-not allergic to plain tylenol, although allergic to Norco -gentle IVF (516m bolus)  HIV: followed by Dr. JMichel Bickers CD-4 920 in 06/2015. Viral load <20 at the same time. On Prezista, Norvir, Tenofovir and Epivir at home. Reports compliance.  -continue home  - consider touching base with ID  ESRD: Patient unable to report cause. On HD TThSa. Last HD on Thursday 1/12, he is due today -Will consult nephrology to discuss dialysis  HSV: on valtrex 500 mg daily  -continue home valtrex  Depression: doesn't appear to be on any medication at home. - Will continue to follow  Tobacco abuse: reports smoking about 5 cigarettes a day. -Will give Nicotine patch 7 mg daily  FEN/GI:  Renal diet Bolus 500 ml x1 and will discuss further fluid management with Renal Protonix  Prophylaxis: heparin SubQ  Disposition: Admit to med surg, attending Dr NNori Riis History of Present Illness:   Caleb NEWSHAMis a 50y.o. male presenting with shortness of breath, chest pain . PMH is significant for HIV on HAART, ESRD on HD (TTHSA), HSV, Smoking, Depression, peripheral neuropathy.  Patient reports that he was in his usual state of health until midnight 1/14 when he started to have cough, shortness of breath and diarrhea. However, he later states having a cough for about two weeks. He states that his cough is productive with yellowish phlegm. He also reports coughing up small amount of blood. He has right sided low chest pain  that radiates to his back. He denies fever but wife reports a temperature of 101 taken by EMS while on his way here. Diarrhea is watery in nature. Denies blood in stool. Denies sick contact or eating uncooked food. He has right upper and lower quadrant pain that  radiates to his back.  Patient reports having a procedure on his HD catheter yesterday. His wife explains that the procedure was something to do with flow "SS".  She denies clot formation.  Social history: no EtOH or recreational drug use. Smokes about 5 cig a day.  ED course: WBC 11, lactic acid 2.81, Alk phos 245, BNP 190, CXR significant for right basilar airspace disease worrisome for pneumonia, blood cultures obtained before antibiotics, started on Levaquin.   Review Of Systems: Per HPI  Otherwise the remainder of the systems were negative.  Patient Active Problem List   Diagnosis Date Noted  . Pneumonia 10/13/2015  . CAP (community acquired pneumonia) 10/13/2015  . Community acquired pneumonia   . Boil 07/05/2015  . 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 ESRD (end stage renal disease) on dialysis 01/02/2012  . PERIPHERAL NEUROPATHY 04/10/2009  . BACTEREMIA, MYCOBACTERIUM AVIUM COMPLEX 10/19/2006  . Human immunodeficiency virus (HIV) disease ([place]) 10/19/2006  . HERPES, GENITAL NEC 10/19/2006  . WARTS, OTHER SPECIFIED VIRAL 10/19/2006  . SYPHILIS NOS 10/19/2006  . CIGARETTE SMOKER 10/19/2006  . DEPRESSION 10/19/2006    Past Medical History: Past Medical History  Diagnosis Date  . HIV infection ([place])   . DJD (degenerative joint disease)     right knee  . Tertiary syphilis   . Genital herpes   . Anal condyloma   . Family history of disseminated HSV infection   . Shortness of breath     "can happen at anytime" (03/04/2013)  . ESRD (end stage renal disease) on dialysis ([place]) 01/02/2012    Gets diaysis at [facility] on [employer], TTS schedule.  Started dialysis in 2000.     Marland Kitchen Thrombosis of dialysis vascular access ([place]) 01/02/2012    RUA AV fistula clotted on 02/27/13, declotted by IR. Reclotted on 03/02/13 and IR placed L IJ tunneled HD catheter (a right external jugular tunneled HD cath was attempted first but did not function due to kinking across the clavicle).  Pt was seen by VVS on same admission 6/614 and said RUA AVF no longer usable, they ordered vein mapping and will see him as outpatient to set up next permanent access.     . Hypertension   . Pneumonia     hx of  . Diarrhea     occurs because of dialysis  . Hemodialysis patient [facility])     Tuesday, Thursday, Saturday    Past Surgical History: Past Surgical History  Procedure Laterality Date  . Av fistula placement, brachiocephalic Right 75/64/33  . Appendectomy    . Tonsillectomy    . Cholecystectomy    . Incision and drainage abscess      abdominal  . Av fistula placement Right 06/16/11  . Revison of arteriovenous fistula Right 04/20/2013    Procedure: INSERTION OF ARTERIOVENOUS GORTEX GRAFT;  Surgeon: Elam Dutch, MD;  Location: [facility] OR;  Service: Vascular;  Laterality: Right;  Ultrasound Guided    Social History: Social History  Substance Use Topics  . Smoking status: Current Every Day Smoker -- 0.50 packs/day for 27 years    Types: Cigarettes  . Smokeless tobacco: Never Used     Comment: cutting back from  last visit  . Alcohol Use: 0.0 oz/week    0 Standard drinks or equivalent per week     Comment: 03/04/2013 "stopped drinking ~ 12 yr ago; never had problem w/it"   Additional social history: no EtOH or recreational drug use  Please also refer to relevant sections of EMR.  Family History: Family History  Problem Relation Age of Onset  . Diabetes Mother   . Arthritis Father     Gout- Foot    Allergies and Medications: Allergies  Allergen Reactions  . Contrast Media [Iodinated Diagnostic Agents] Hives and Itching  . Acetaminophen-Codeine Nausea And Vomiting  . Penicillins Hives    Has patient had a PCN reaction causing immediate rash, facial/tongue/throat swelling, SOB or lightheadedness with hypotension: Yes Has patient had a PCN reaction causing severe rash involving mucus membranes or skin necrosis: No Has patient had a PCN reaction that required hospitalization  Yes Has patient had a PCN reaction occurring within the last 10 years: No If all of the above answers are "NO", then may proceed with Cephalosporin use.   No current facility-administered medications on file prior to encounter.   Current Outpatient Prescriptions on File Prior to Encounter  Medication Sig Dispense Refill  . acetaminophen (TYLENOL) 650 MG CR tablet Take 1,300 mg by mouth every 6 (six) hours as needed for pain.    Marland Kitchen albuterol (PROVENTIL HFA;VENTOLIN HFA) 108 (90 BASE) MCG/ACT inhaler Inhale 1-2 puffs into the lungs every 6 (six) hours as needed for wheezing or shortness of breath. 1 Inhaler 0  . calcium acetate (PHOSLO) 667 MG capsule Take 2,668 mg by mouth See admin instructions. Take 4 capsules (2668 mg) by mouth with meals - once or twice daily    . Darunavir Ethanolate (PREZISTA) 800 MG tablet Take 1 tablet (800 mg total) by mouth at bedtime. Take with Norvir tablet. 30 tablet 11  . ritonavir (NORVIR) 100 MG TABS tablet Take 1 tablet (100 mg total) by mouth at bedtime. Take with Prezista tablet. 30 tablet 11  . tenofovir (VIREAD) 300 MG tablet Take 1 tablet (300 mg total) by mouth once a week. On Sundays (Patient taking differently: Take 300 mg by mouth every Sunday. On Sundays) 4 tablet 11  . valACYclovir (VALTREX) 500 MG tablet Take 1 tablet (500 mg total) by mouth daily as needed (herpes outbreaks). 30 tablet 5  . HYDROcodone-acetaminophen (NORCO) 5-325 MG per tablet Take 1 tablet by mouth every 6 (six) hours as needed for severe pain. (Patient not taking: Reported on 08/04/2015) 6 tablet 0  . lamiVUDine (EPIVIR) 10 MG/ML solution Take 5 mLs (50 mg total) by mouth daily. (Patient not taking: Reported on 10/13/2015) 240 mL 11    Objective: BP 128/110 mmHg  Pulse 107  Temp(Src) 98.1 F (36.7 C) (Axillary)  Resp 26  Ht _0  (1.803 m)  Wt 145 lb 8.1 oz (66 kg)  BMI 20.30 kg/m2  SpO2 98% Exam: Gen: appears in moderate distress from pain and shortness of breath, but O2  sats 98-100% Oropharynx: mm appears mildly dry, no oral lesions Neck: supple, no LAD CV: regular rate and rhythm. S1 & S2 audible, no ext edema, no JVD Resp: tachypneic, in distress from dyspnea and pain, clear to auscultation bilaterally, sating at 100%. GI: bowel sounds normal, tenderness to light palpation mostly over RUQ and RLQ, patient didn't tolerate deep palpation GU: CVA tenderness on right Skin: no lesions or rash noted Labs and Imaging: CBC BMET   Recent Labs Lab 10/13/15  1013 10/13/15 1023  WBC 11.3*  --   HGB 13.5 16.0  HCT 41.5 47.0  PLT 101*  --     Recent Labs Lab 10/13/15 1023  NA 140  K 4.9  CL 100*  BUN 36*  CREATININE 10.90*  GLUCOSE 87      Mercy Riding, MD 10/13/2015, 1:25 PM PGY-1, [place] Intern pager: [phone], text pages welcome   I have seen and examined the patient. I have read and agree with the above note. My changes are noted in blue.  Yareliz Thorstenson A. Lincoln Brigham MD, [facility] Resident PGY-2 Pager [phone]

## 2015-10-13 NOTE — ED Notes (Signed)
Pt taken to dialysis 

## 2015-10-13 NOTE — ED Notes (Signed)
Spoke to lab, states they will be able to run pro-calcitonin as add on.

## 2015-10-13 NOTE — ED Notes (Signed)
Pt's wife keeps coming out the room asking for ice chips for the pt. Pt given 2nd cup of ice chips. MD made aware.

## 2015-10-13 NOTE — Progress Notes (Signed)
[facility] resp rate =40, pt in pain,elevated d dimer, sats mid 90's on 3 liters [place], BP 96/61 after partial bolus. Questioned if needed higher level of care. Dr.Haney texted, Morphine ordered. Resp rate 27, less pain. Hemo to receive patient within the hour. [name] called to inform to bring pt to floor.

## 2015-10-13 NOTE — ED Notes (Signed)
Nuc med tech came in for pt, informed that pt was called to dialysis.

## 2015-10-13 NOTE — ED Notes (Signed)
Spoke with floor RN Synetta Failnita, awaiting return page from Dr Kennon RoundsHaney. Rapid response RN unavailable at this time.

## 2015-10-13 NOTE — ED Notes (Signed)
Pt request oxygen with c/o shortness of breath. Pt placed on 2L O2 via nasal cannula for comfort.

## 2015-10-13 NOTE — Consult Note (Signed)
Renal Service Consult Note [name]  [name] 10/13/2015 [name] Requesting Physician:  Dr Jennette Kettle  Reason for Consult:  ESRD pt cough and possible PNA HPI: The patient is a 50 y.o. year-old with hx of HIV and ESRD on HD. Presented to ED this am with SOB, chest pain and n/v/d.  Missed HD today due to not feeling well.  Yellow sputum production. CXR showed hazy RLL infiltrate, admitted for CAP. Last CD4 920. Admitted, plan for abx w levaquin. Abd CT. D-dimer. Asked to see for dialysis.  Patient's wife says he was feeling fine yesterday, went to [facility] and had a procedure done for slow access flows to "open up" the blood vessels.  Later that evening he developed cough and SOB, then pleuritic CP and R sided abd pain that is worse w coughing.  Then developed nausea, vomiting. Temp by EMS was 101 per wife. Came to ED where prelim dx is PNA. CXR showing hazy RLL infiltrate, o/w clear.  Got IV levaquin, admitted per FPTS.  Patient reporting SOB.  Weighed standing and is right at dry wt.      ROS  had temp 101 in ambulance, normal here  no sore throat  no rash  no joitnj pain  no back pain  no bloody stool   no confusion or seziures  Past Medical History  Past Medical History  Diagnosis Date  . HIV infection (HCC)   . DJD (degenerative joint disease)     right knee  . Tertiary syphilis   . Genital herpes   . Anal condyloma   . Family history of disseminated HSV infection   . Shortness of breath     "can happen at anytime" (03/04/2013)  . ESRD (end stage renal disease) on dialysis (HCC) 01/02/2012    Gets diaysis at [facility] on [address], TTS schedule.  Started dialysis in 2000.     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 Reclotted on 03/02/13 and IR placed L IJ tunneled HD catheter (a right external jugular tunneled HD cath was attempted first but did not function due to  kinking across the clavicle). Pt was seen by VVS on same admission 6/614 and said RUA AVF no longer usable, they ordered vein mapping and will see him as outpatient to set up next permanent access.     . Hypertension   . Pneumonia     hx of  . Diarrhea     occurs because of dialysis  . Hemodialysis patient [name])     Tuesday, Thursday, Saturday   Past Surgical History  Past Surgical History  Procedure Laterality Date  . Av fistula placement, brachiocephalic Right 06/16/11  . Appendectomy    . Tonsillectomy    . Cholecystectomy    . Incision and drainage abscess      abdominal  . Av fistula placement Right 06/16/11  . Revison of arteriovenous fistula Right 04/20/2013    Procedure: INSERTION OF ARTERIOVENOUS GORTEX GRAFT;  Surgeon: Sherren Kerns, MD;  Location: [facility] OR;  Service: Vascular;  Laterality: Right;  Ultrasound Guided   Family History  Family History  Problem Relation Age of Onset  . Diabetes Mother   . Arthritis Father     Gout- Foot   Social History  reports that he has been smoking Cigarettes.  He has a 13.5 pack-year smoking history. He has never used smokeless tobacco. He  reports that he drinks alcohol. He reports that he does not use illicit drugs. Allergies  Allergies  Allergen Reactions  . Contrast Media [Iodinated Diagnostic Agents] Hives and Itching  . Acetaminophen-Codeine Nausea And Vomiting  . Penicillins Hives    Has patient had a PCN reaction causing immediate rash, facial/tongue/throat swelling, SOB or lightheadedness with hypotension: Yes Has patient had a PCN reaction causing severe rash involving mucus membranes or skin necrosis: No Has patient had a PCN reaction that required hospitalization Yes Has patient had a PCN reaction occurring within the last 10 years: No If all of the above answers are "NO", then may proceed with Cephalosporin use.   Home medications Prior to Admission medications   Medication Sig Start Date End Date Taking? Authorizing  Provider  acetaminophen (TYLENOL) 650 MG CR tablet Take 1,300 mg by mouth every 6 (six) hours as needed for pain.   Yes Historical Provider, MD  albuterol (PROVENTIL HFA;VENTOLIN HFA) 108 (90 BASE) MCG/ACT inhaler Inhale 1-2 puffs into the lungs every 6 (six) hours as needed for wheezing or shortness of breath. 09/13/13  Yes Purvis SheffieldForrest Harrison, MD  calcium acetate (PHOSLO) 667 MG capsule Take 2,668 mg by mouth See admin instructions. Take 4 capsules (2668 mg) by mouth with meals - once or twice daily   Yes Historical Provider, MD  Darunavir Ethanolate (PREZISTA) 800 MG tablet Take 1 tablet (800 mg total) by mouth at bedtime. Take with Norvir tablet. 08/17/15  Yes Cliffton AstersJohn Campbell, MD  ritonavir (NORVIR) 100 MG TABS tablet Take 1 tablet (100 mg total) by mouth at bedtime. Take with Prezista tablet. 08/17/15  Yes Cliffton AstersJohn Campbell, MD  tenofovir (VIREAD) 300 MG tablet Take 1 tablet (300 mg total) by mouth once a week. On Sundays Patient taking differently: Take 300 mg by mouth every Sunday. On Sundays 08/17/15  Yes Cliffton AstersJohn Campbell, MD  valACYclovir (VALTREX) 500 MG tablet Take 1 tablet (500 mg total) by mouth daily as needed (herpes outbreaks). 06/05/15  Yes Cliffton AstersJohn Campbell, MD  HYDROcodone-acetaminophen (NORCO) 5-325 MG per tablet Take 1 tablet by mouth every 6 (six) hours as needed for severe pain. Patient not taking: Reported on 08/04/2015 02/28/15   Mercedes Camprubi-Soms, PA-C  lamiVUDine (EPIVIR) 10 MG/ML solution Take 5 mLs (50 mg total) by mouth daily. Patient not taking: Reported on 10/13/2015 08/17/15   Cliffton AstersJohn Campbell, MD   Liver Function Tests  Recent Labs Lab 10/13/15 1013  AST 41  ALT 38  ALKPHOS 245*  BILITOT 1.5*  PROT 7.6  ALBUMIN 3.3*   No results for input(s): LIPASE, AMYLASE in the last 168 hours. CBC  Recent Labs Lab 10/13/15 1013 10/13/15 1023  WBC 11.3*  --   NEUTROABS 8.8*  --   HGB 13.5 16.0  HCT 41.5 47.0  MCV 98.1  --   PLT 101*  --    Basic Metabolic Panel  Recent  Labs Lab 10/13/15 1023  NA 140  K 4.9  CL 100*  GLUCOSE 87  BUN 36*  CREATININE 10.90*    Filed Vitals:   10/13/15 1334 10/13/15 1345 10/13/15 1400 10/13/15 1430  BP:  92/45 106/48 94/59  Pulse:   93 98  Temp:      TempSrc:      Resp:      Height:      Weight:      SpO2: 96%  93% 98%   Exam Looks toxic, uncomfortable , in pain, not in distress, sitting up on side of stretcher No rash,  cyanosis or gangrene Sclera anicteric, throat clear No jvd or nodes Chest coarse rales/ rhonchi bilat lower lung fields RRR no mrg Abd diffusely tender, vol guarding, no rebound, +BS GU not examined MS no joint effusions Ext no LE or UE edema LUA AV graft +bruit Neuro is alert, Ox 3    D[facility]  3h  67kg  2/3.5Ca bath  P4   Heparin none  LUA AVG 12.9 / 16% / -  > Venofer 100 x 10, 6 left 9.3 / 6.9 / 181  > Hect 1 ug   Assessment: 1 Cough/ SOB/fever/ N/V - possible PNA RLL, consider flu 2 ESRD on HD tts 3 HIV good counts last measured 4 Anemia on Fe, no esa 5 MBD binders, vit D   Plan - HD today, no fluid off, on levaquin. Will follow.   Caleb Moselle MD [employer] pager [phone]    cell [phone] 10/13/2015, 2:51 PM

## 2015-10-13 NOTE — ED Notes (Signed)
Admitting MD at bedside.

## 2015-10-13 NOTE — ED Notes (Signed)
Radiology at bedside

## 2015-10-13 NOTE — ED Notes (Signed)
Diet tray ordered 

## 2015-10-13 NOTE — ED Notes (Signed)
Per EMS, pt comes from home with c/o shortness of breath, chest pain, N/V/D. Pt has dialysis T-R-SAT. Pt did not go to dialysis today because of not feeling well. Pt A&Ox4, NAD noted. Sinus Tach on monitor. CBG 84

## 2015-10-13 NOTE — Progress Notes (Signed)
ANTIBIOTIC CONSULT NOTE - INITIAL  Pharmacy Consult for Levaquin Indication: rule out pneumonia  Allergies  Allergen Reactions  . Contrast Media [Iodinated Diagnostic Agents] Hives and Itching  . Acetaminophen-Codeine Nausea And Vomiting  . Penicillins Hives    Has patient had a PCN reaction causing immediate rash, facial/tongue/throat swelling, SOB or lightheadedness with hypotension: Yes Has patient had a PCN reaction causing severe rash involving mucus membranes or skin necrosis: No Has patient had a PCN reaction that required hospitalization Yes Has patient had a PCN reaction occurring within the last 10 years: No If all of the above answers are "NO", then may proceed with Cephalosporin use.    Patient Measurements: Height: 5\' 11"  (180.3 cm) Weight: 145 lb 8.1 oz (66 kg) IBW/kg (Calculated) : 75.3 Adjusted Body Weight:   Vital Signs: Temp: 98.1 F (36.7 C) (01/14 0959) Temp Source: Axillary (01/14 0959) BP: 94/59 mmHg (01/14 1430) Pulse Rate: 95 (01/14 1500) Intake/Output from previous day:   Intake/Output from this shift:    Labs:  Recent Labs  10/13/15 1013 10/13/15 1023  WBC 11.3*  --   HGB 13.5 16.0  PLT 101*  --   CREATININE  --  10.90*   Estimated Creatinine Clearance: 7.8 mL/min (by C-G formula based on Cr of 10.9). No results for input(s): VANCOTROUGH, VANCOPEAK, VANCORANDOM, GENTTROUGH, GENTPEAK, GENTRANDOM, TOBRATROUGH, TOBRAPEAK, TOBRARND, AMIKACINPEAK, AMIKACINTROU, AMIKACIN in the last 72 hours.   Microbiology: No results found for this or any previous visit (from the past 720 hour(s)).  Medical History: Past Medical History  Diagnosis Date  . HIV infection (HCC)   . DJD (degenerative joint disease)     right knee  . Tertiary syphilis   . Genital herpes   . Anal condyloma   . Family history of disseminated HSV infection   . Shortness of breath     "can happen at anytime" (03/04/2013)  . ESRD (end stage renal disease) on dialysis (HCC)  01/02/2012    [facility] [address], TTS schedule.  Started dialysis in 2000.     Marland Kitchen. 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 Reclotted on 03/02/13 and IR placed L IJ tunneled HD catheter (a right external jugular tunneled HD cath was attempted first but did not function due to kinking across the clavicle). Pt was seen by VVS on same admission 6/614 and said RUA AVF no longer usable, they ordered vein mapping and will see him as outpatient to set up next permanent access.     . Hypertension   . Pneumonia     hx of  . Diarrhea     occurs because of dialysis  . Hemodialysis patient [facility](HCC)     Tuesday, Thursday, Saturday    Medications:   (Not in a hospital admission) Scheduled:   Infusions:  . [START ON 10/15/2015] levofloxacin (LEVAQUIN) IV     Assessment: 50yo male with history of ESRD on HD (TTS), HIV on HAART and HSV presents with SOB and cough. Pharmacy is consulted to dose levaquin for suspected CAP. Pt is afebrile, WBC 11.3, LA 2.93.  Goal of Therapy:  Eradication of infection  Plan:  Levaquin 750mg  IV once followed by 500mg  q48h Expected duration 5 days with resolution of temperature and/or normalization of WBC Follow up culture results, HD plans and clinical course  Arlean HoppingCorey M. Newman PiesBall, PharmD, BCPS Clinical Pharmacist Pager [phone] 10/13/2015,3:54 PM

## 2015-10-13 NOTE — ED Notes (Signed)
Patient transported to CT 

## 2015-10-13 NOTE — ED Notes (Signed)
Report called to floor RN, request rapid response RN eval pt before transport. Per floor RN, dialysis will call for pt in about 1 hour.

## 2015-10-13 NOTE — ED Notes (Signed)
Spoke with dialysis, about another hour before they will be ready for pt.

## 2015-10-13 NOTE — ED Notes (Signed)
Spoke with Dr Kennon RoundsHaney Re: pt needs tele bed due to increased heart rate and respiratory rate.

## 2015-10-13 NOTE — ED Notes (Signed)
Radiology called this RN, stated NM not available, requesting confirmation patient needs scan today.  Renal MD at bedside, patient symptomatic and tripoding in room with O2, saturations 100%.  Spoke to Dr. Kennon RoundsHaney who states patient needs scan.  Spoke to radiology again, and patient may not be candidate if unable to lay flat for one hour.  Made Dr. Kennon RoundsHaney aware and provided her with the radiologist's number.  Dr. Kennon RoundsHaney to follow up.

## 2015-10-13 NOTE — ED Notes (Signed)
Spoke to Dr. Jonathon JordanGambino, will continue to monitor patient's condition and follow up if needed.

## 2015-10-14 ENCOUNTER — Inpatient Hospital Stay (HOSPITAL_COMMUNITY): Payer: Medicare Other

## 2015-10-14 DIAGNOSIS — B20 Human immunodeficiency virus [HIV] disease: Secondary | ICD-10-CM

## 2015-10-14 DIAGNOSIS — R52 Pain, unspecified: Secondary | ICD-10-CM | POA: Insufficient documentation

## 2015-10-14 DIAGNOSIS — D696 Thrombocytopenia, unspecified: Secondary | ICD-10-CM

## 2015-10-14 DIAGNOSIS — N186 End stage renal disease: Secondary | ICD-10-CM

## 2015-10-14 DIAGNOSIS — R0600 Dyspnea, unspecified: Secondary | ICD-10-CM | POA: Insufficient documentation

## 2015-10-14 DIAGNOSIS — Z992 Dependence on renal dialysis: Secondary | ICD-10-CM

## 2015-10-14 DIAGNOSIS — R0602 Shortness of breath: Secondary | ICD-10-CM | POA: Insufficient documentation

## 2015-10-14 DIAGNOSIS — R1011 Right upper quadrant pain: Secondary | ICD-10-CM | POA: Insufficient documentation

## 2015-10-14 DIAGNOSIS — K8020 Calculus of gallbladder without cholecystitis without obstruction: Secondary | ICD-10-CM | POA: Insufficient documentation

## 2015-10-14 DIAGNOSIS — R091 Pleurisy: Secondary | ICD-10-CM

## 2015-10-14 DIAGNOSIS — D649 Anemia, unspecified: Secondary | ICD-10-CM | POA: Diagnosis present

## 2015-10-14 DIAGNOSIS — R042 Hemoptysis: Secondary | ICD-10-CM

## 2015-10-14 LAB — PROCALCITONIN: PROCALCITONIN: 3.17 ng/mL

## 2015-10-14 LAB — CBC
HEMATOCRIT: 36.7 % — AB (ref 39.0–52.0)
Hemoglobin: 12 g/dL — ABNORMAL LOW (ref 13.0–17.0)
MCH: 31.3 pg (ref 26.0–34.0)
MCHC: 32.7 g/dL (ref 30.0–36.0)
MCV: 95.8 fL (ref 78.0–100.0)
PLATELETS: 82 10*3/uL — AB (ref 150–400)
RBC: 3.83 MIL/uL — ABNORMAL LOW (ref 4.22–5.81)
RDW: 14.6 % (ref 11.5–15.5)
WBC: 11.7 10*3/uL — ABNORMAL HIGH (ref 4.0–10.5)

## 2015-10-14 LAB — COMPREHENSIVE METABOLIC PANEL
ALT: 25 U/L (ref 17–63)
AST: 20 U/L (ref 15–41)
Albumin: 2.7 g/dL — ABNORMAL LOW (ref 3.5–5.0)
Alkaline Phosphatase: 155 U/L — ABNORMAL HIGH (ref 38–126)
Anion gap: 16 — ABNORMAL HIGH (ref 5–15)
BUN: 26 mg/dL — AB (ref 6–20)
CHLORIDE: 96 mmol/L — AB (ref 101–111)
CO2: 26 mmol/L (ref 22–32)
CREATININE: 8.65 mg/dL — AB (ref 0.61–1.24)
Calcium: 9.1 mg/dL (ref 8.9–10.3)
GFR calc Af Amer: 8 mL/min — ABNORMAL LOW (ref >60)
GFR, EST NON AFRICAN AMERICAN: 6 mL/min — AB (ref >60)
GLUCOSE: 50 mg/dL — AB (ref 65–99)
POTASSIUM: 5.7 mmol/L — AB (ref 3.5–5.1)
Sodium: 138 mmol/L (ref 135–145)
Total Bilirubin: 1.4 mg/dL — ABNORMAL HIGH (ref 0.3–1.2)
Total Protein: 7.3 g/dL (ref 6.5–8.1)

## 2015-10-14 LAB — BASIC METABOLIC PANEL
ANION GAP: 18 — AB (ref 5–15)
BUN: 36 mg/dL — AB (ref 6–20)
CHLORIDE: 93 mmol/L — AB (ref 101–111)
CO2: 26 mmol/L (ref 22–32)
Calcium: 9.5 mg/dL (ref 8.9–10.3)
Creatinine, Ser: 9.16 mg/dL — ABNORMAL HIGH (ref 0.61–1.24)
GFR calc Af Amer: 7 mL/min — ABNORMAL LOW (ref >60)
GFR calc non Af Amer: 6 mL/min — ABNORMAL LOW (ref >60)
GLUCOSE: 64 mg/dL — AB (ref 65–99)
POTASSIUM: 6 mmol/L — AB (ref 3.5–5.1)
Sodium: 137 mmol/L (ref 135–145)

## 2015-10-14 LAB — LACTIC ACID, PLASMA
LACTIC ACID, VENOUS: 3.1 mmol/L — AB (ref 0.5–2.0)
LACTIC ACID, VENOUS: 2.8 mmol/L — AB (ref 0.5–2.0)
LACTIC ACID, VENOUS: 2.6 mmol/L — AB (ref 0.5–2.0)
Lactic Acid, Venous: 2.6 mmol/L (ref 0.5–2.0)

## 2015-10-14 LAB — INFLUENZA PANEL BY PCR (TYPE A & B)
H1N1 flu by pcr: NOT DETECTED
Influenza A By PCR: NEGATIVE
Influenza B By PCR: NEGATIVE

## 2015-10-14 LAB — HEPARIN LEVEL (UNFRACTIONATED): Heparin Unfractionated: 0.1 IU/mL — ABNORMAL LOW (ref 0.30–0.70)

## 2015-10-14 LAB — MRSA PCR SCREENING: MRSA by PCR: NEGATIVE

## 2015-10-14 MED ORDER — HEPARIN SODIUM (PORCINE) 5000 UNIT/ML IJ SOLN
5000.0000 [IU] | Freq: Three times a day (TID) | INTRAMUSCULAR | Status: DC
  Administered 2015-10-14: 5000 [IU] via SUBCUTANEOUS
  Filled 2015-10-14: qty 1

## 2015-10-14 MED ORDER — ALBUTEROL SULFATE HFA 108 (90 BASE) MCG/ACT IN AERS: 1.0000 | INHALATION_SPRAY | Freq: Four times a day (QID) | RESPIRATORY_TRACT | Status: DC | PRN

## 2015-10-14 MED ORDER — LAMIVUDINE 10 MG/ML PO SOLN
50.0000 mg | Freq: Every day | ORAL | Status: DC
  Filled 2015-10-14: qty 5

## 2015-10-14 MED ORDER — DEXTROSE 5 % IV SOLN
2.0000 g | INTRAVENOUS | Status: DC
  Administered 2015-10-14: 2 g via INTRAVENOUS

## 2015-10-14 MED ORDER — NICOTINE 7 MG/24HR TD PT24
7.0000 mg | MEDICATED_PATCH | Freq: Every day | TRANSDERMAL | Status: DC
  Administered 2015-10-14 – 2015-10-17 (×4): 7 mg via TRANSDERMAL
  Filled 2015-10-14 (×5): qty 1

## 2015-10-14 MED ORDER — CALCIUM ACETATE (PHOS BINDER) 667 MG PO CAPS
2668.0000 mg | ORAL_CAPSULE | Freq: Three times a day (TID) | ORAL | Status: DC
  Administered 2015-10-14 – 2015-10-17 (×9): 2668 mg via ORAL
  Filled 2015-10-14 (×15): qty 4

## 2015-10-14 MED ORDER — RITONAVIR 100 MG PO TABS
100.0000 mg | ORAL_TABLET | Freq: Every day | ORAL | Status: DC
  Administered 2015-10-14 – 2015-10-16 (×3): 100 mg via ORAL
  Filled 2015-10-14 (×6): qty 1

## 2015-10-14 MED ORDER — VALACYCLOVIR HCL 500 MG PO TABS
500.0000 mg | ORAL_TABLET | Freq: Every day | ORAL | Status: DC | PRN
  Filled 2015-10-14: qty 1

## 2015-10-14 MED ORDER — VANCOMYCIN HCL IN DEXTROSE 750-5 MG/150ML-% IV SOLN: 750.0000 mg | INTRAVENOUS | Status: DC

## 2015-10-14 MED ORDER — VANCOMYCIN HCL 10 G IV SOLR
1250.0000 mg | INTRAVENOUS | Status: AC
  Administered 2015-10-15: 1250 mg via INTRAVENOUS
  Filled 2015-10-14: qty 1250

## 2015-10-14 MED ORDER — ACETAMINOPHEN 325 MG PO TABS: 650.0000 mg | ORAL_TABLET | Freq: Four times a day (QID) | ORAL | Status: DC | PRN

## 2015-10-14 MED ORDER — FENTANYL CITRATE (PF) 100 MCG/2ML IJ SOLN
12.5000 ug | Freq: Once | INTRAMUSCULAR | Status: AC
  Administered 2015-10-14: 12.5 ug via INTRAVENOUS
  Filled 2015-10-14: qty 2

## 2015-10-14 MED ORDER — TECHNETIUM TO 99M ALBUMIN AGGREGATED
4.3400 | Freq: Once | INTRAVENOUS | Status: AC | PRN
  Administered 2015-10-14: 4 via INTRAVENOUS

## 2015-10-14 MED ORDER — DEXTROSE 5 % IV SOLN
2.0000 g | INTRAVENOUS | Status: AC
  Administered 2015-10-14: 2 g via INTRAVENOUS
  Filled 2015-10-14: qty 2

## 2015-10-14 MED ORDER — IPRATROPIUM-ALBUTEROL 0.5-2.5 (3) MG/3ML IN SOLN: 3.0000 mL | RESPIRATORY_TRACT | Status: AC | PRN

## 2015-10-14 MED ORDER — HEPARIN (PORCINE) IN NACL 100-0.45 UNIT/ML-% IJ SOLN
1250.0000 [IU]/h | INTRAMUSCULAR | Status: DC
  Administered 2015-10-14: 1000 [IU]/h via INTRAVENOUS
  Filled 2015-10-14 (×2): qty 250

## 2015-10-14 MED ORDER — VANCOMYCIN HCL 10 G IV SOLR
1500.0000 mg | Freq: Once | INTRAVENOUS | Status: DC
  Filled 2015-10-14: qty 1500

## 2015-10-14 MED ORDER — TENOFOVIR DISOPROXIL FUMARATE 300 MG PO TABS
300.0000 mg | ORAL_TABLET | ORAL | Status: DC
  Administered 2015-10-14: 300 mg via ORAL
  Filled 2015-10-14 (×3): qty 1

## 2015-10-14 MED ORDER — PREDNISONE 50 MG PO TABS
50.0000 mg | ORAL_TABLET | Freq: Four times a day (QID) | ORAL | Status: AC
  Administered 2015-10-14 – 2015-10-15 (×3): 50 mg via ORAL
  Filled 2015-10-14 (×3): qty 1

## 2015-10-14 MED ORDER — ALBUTEROL SULFATE (2.5 MG/3ML) 0.083% IN NEBU: 3.0000 mL | INHALATION_SOLUTION | Freq: Four times a day (QID) | RESPIRATORY_TRACT | Status: DC | PRN

## 2015-10-14 MED ORDER — TECHNETIUM TC 99M DIETHYLENETRIAME-PENTAACETIC ACID: 34.3000 | Freq: Once | INTRAVENOUS | Status: DC | PRN

## 2015-10-14 MED ORDER — IPRATROPIUM-ALBUTEROL 0.5-2.5 (3) MG/3ML IN SOLN
3.0000 mL | Freq: Four times a day (QID) | RESPIRATORY_TRACT | Status: AC
  Administered 2015-10-14 – 2015-10-15 (×3): 3 mL via RESPIRATORY_TRACT
  Filled 2015-10-14 (×4): qty 3

## 2015-10-14 MED ORDER — WHITE PETROLATUM GEL
Status: AC
  Filled 2015-10-14: qty 1

## 2015-10-14 MED ORDER — DIPHENHYDRAMINE HCL 25 MG PO CAPS
50.0000 mg | ORAL_CAPSULE | Freq: Once | ORAL | Status: AC
  Administered 2015-10-15: 50 mg via ORAL
  Filled 2015-10-14: qty 2

## 2015-10-14 MED ORDER — CETYLPYRIDINIUM CHLORIDE 0.05 % MT LIQD
7.0000 mL | Freq: Two times a day (BID) | OROMUCOSAL | Status: DC
  Administered 2015-10-14 – 2015-10-17 (×6): 7 mL via OROMUCOSAL

## 2015-10-14 MED ORDER — LEVOFLOXACIN IN D5W 500 MG/100ML IV SOLN: 500.0000 mg | INTRAVENOUS | Status: DC

## 2015-10-14 MED ORDER — DARUNAVIR ETHANOLATE 800 MG PO TABS
800.0000 mg | ORAL_TABLET | Freq: Every day | ORAL | Status: DC
  Administered 2015-10-14 – 2015-10-16 (×3): 800 mg via ORAL
  Filled 2015-10-14 (×5): qty 1

## 2015-10-14 MED ORDER — ENSURE ENLIVE PO LIQD
237.0000 mL | Freq: Two times a day (BID) | ORAL | Status: DC
  Administered 2015-10-14 – 2015-10-15 (×2): 237 mL via ORAL

## 2015-10-14 MED ORDER — FENTANYL CITRATE (PF) 100 MCG/2ML IJ SOLN
25.0000 ug | INTRAMUSCULAR | Status: DC | PRN
  Administered 2015-10-14 (×6): 25 ug via INTRAVENOUS
  Filled 2015-10-14 (×6): qty 2

## 2015-10-14 MED ORDER — HEPARIN BOLUS VIA INFUSION
2000.0000 [IU] | Freq: Once | INTRAVENOUS | Status: AC
  Administered 2015-10-14: 2000 [IU] via INTRAVENOUS
  Filled 2015-10-14: qty 2000

## 2015-10-14 MED ORDER — HEPARIN BOLUS VIA INFUSION
4000.0000 [IU] | Freq: Once | INTRAVENOUS | Status: AC
  Administered 2015-10-14: 4000 [IU] via INTRAVENOUS
  Filled 2015-10-14: qty 4000

## 2015-10-14 MED ORDER — SODIUM CHLORIDE 0.9 % IV BOLUS (SEPSIS)
500.0000 mL | Freq: Once | INTRAVENOUS | Status: AC
  Administered 2015-10-14: 500 mL via INTRAVENOUS

## 2015-10-14 NOTE — Progress Notes (Signed)
ANTIBIOTIC CONSULT NOTE  Pharmacy Consult for Levaquin Indication: CAP Allergies  Allergen Reactions  . Contrast Media [Iodinated Diagnostic Agents] Hives and Itching  . Acetaminophen-Codeine Nausea And Vomiting  . Penicillins Hives    Has patient had a PCN reaction causing immediate rash, facial/tongue/throat swelling, SOB or lightheadedness with hypotension: Yes Has patient had a PCN reaction causing severe rash involving mucus membranes or skin necrosis: No Has patient had a PCN reaction that required hospitalization Yes Has patient had a PCN reaction occurring within the last 10 years: No If all of the above answers are "NO", then may proceed with Cephalosporin use.    Patient Measurements: Height: 5\' 11"  (180.3 cm) Weight: 145 lb 11.6 oz (66.1 kg) IBW/kg (Calculated) : 75.3   Vital Signs: Temp: 98.3 F (36.8 C) (01/15 0606) Temp Source: Oral (01/15 0606) BP: 114/68 mmHg (01/15 0606) Pulse Rate: 104 (01/15 0606) Intake/Output from previous day: 01/14 0701 - 01/15 0700 In: 500 [I.V.:500] Out: 0  Intake/Output from this shift:    Labs:  Recent Labs  10/13/15 1013 10/13/15 1023 10/14/15 0414  WBC 11.3*  --  11.7*  HGB 13.5 16.0 12.0*  PLT 101*  --  82*  CREATININE  --  10.90* 8.65*   Estimated Creatinine Clearance: 9.9 mL/min (by C-G formula based on Cr of 8.65). No results for input(s): VANCOTROUGH, VANCOPEAK, VANCORANDOM, GENTTROUGH, GENTPEAK, GENTRANDOM, TOBRATROUGH, TOBRAPEAK, TOBRARND, AMIKACINPEAK, AMIKACINTROU, AMIKACIN in the last 72 hours.   Microbiology: No results found for this or any previous visit (from the past 720 hour(s)).     Assessment: 50yo male with history of ESRD on HD (TTS), HIV on HAART and HSV presents with SOB and cough. Pharmacy consulted to dose levaquin for CAP. Pt is afebrile, WBC 11.7, LA 2.6 Vanc/zosyn ordered this morning but changed back to lvq.  Pt with allergy to PCN = itching.  Goal of Therapy:  Eradication of  infection  Plan:  Levaquin 750mg  IV was given 1/14 at 1144 am followed by 500mg  q48h to start 1/16 at 10 am Expected duration 5 days with resolution of temperature and/or normalization of WBC Follow up culture results, HD plans and clinical course  Herby AbrahamMichelle T. Edis Huish, Ilda BassetPharm.D. [phone] 10/14/2015 7:53 AM

## 2015-10-14 NOTE — Progress Notes (Signed)
Family Medicine Teaching Service Daily Progress Note Intern Pager: [phone]  Patient name: DELVONTE BERENSON Medical record number: [id number] Date of birth: Jan 11, 1966 Age: 50 y.o. Gender: male  Primary Care Provider: Donetta Potts, MD Consultants: Renal Code Status: Full  Pt Overview and Major Events to Date:  1/14: SOB Concerning for PNA vs PE. Elevated lactic acid and RUQ pain  Assessment and Plan: SEAN MALINOWSKI is a 50 y.o. male presenting with shortness of breath, chest pain . PMH is significant for HIV on HAART, ESRD on HD (TTHSA), HSV, Smoking, Depression, peripheral neuropathy.  Cough/Dyspnea: acute worsening of his dyspnea, cough and new hemoptysis is concerning for pneumonia. CXR significant for right basilar airspace disease worrisome for pneumonia. Unlikley to be PCP with CD4 of 920. There is no objective fever. WBC at 11. Lactic acid mildly elevated to 2.8. PE is a possibility especially with his recent surgical procedure on his HD cath, on 1/13 per report. Wells score 4 placing him at moderate risk for PE, d-dimer 3.67. Despite reported subjective SOB, patient is sating in upper 90's to 100%. However, PE risk stratifying testing, are concerning for potential PE and warrant further workup. He has been prescribed albuterol in past, but does not carry a diagnosis of asthma.  - Admit to FPTS. Attending Dr. Nori Riis - VQ scan: Intermediate probability for PE  Will discuss pretreating with steroids for CT Scan - Hx of Hives, no resp symptoms with previous contrast  Start Heparin per pharm for possible PE treatment  - Abx. Day 2. Levaquin at 750 mg dosing per pharm taking into account ESRD (1/14>) - Trend lactic acid: 2.8 > 4.7 > 2.6 - Oxygen as needed  - continue home albuterol as needed - Echo: Ordered; BNP 190 - Schedule Duonebs  Abdominal pain/Diarrhea: Potentially gastroenteritis with reported diarrhea and emesis at home. His lactic acid is also 2.8. Others on  differential are MSK, psychogenic abdominal pain, cholelithiasis, cholecystitis or liver abscess. Alk phos elevated but no transaminitis. Low suspicion for ileus or SBO with diarrhea. Patient is tender to light palpation. Mostly over his right upper and lower quadrant. He is also tender over back on the right side with right CVA tenderness. He has history of abdominal abscess in the past. CT abdomen and pelvis without contrast: Focal right lower lobe pneumonia. Additional very bronchovascular interstitial opacities are noted both lower lobes and, to a lesser degree, in the right middle lobe consistent with bronchitis.  No acute findings below the diaphragm.  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 - GGT: Elevated 283 - Tylenol for pain-not allergic to plain tylenol, although allergic to Norco - Consider GI consult on Monday if not improving with PNA / PE treatment  HIV: followed by Dr. Michel Bickers. CD-4 920 in 06/2015. Viral load <20 at the same time. On Prezista, Norvir, Tenofovir and Epivir at home. Reports compliance.  - continue home meds - Will touching base with ID  ESRD: Patient unable to report cause. On HD TThSa. [place] [facility] for dialysis  HSV: on valtrex 500 mg daily  -continue home valtrex  Depression: doesn't appear to be on any medication at home. - Will continue to follow  Tobacco abuse: reports smoking about 5 cigarettes a day. -Will give Nicotine patch 7 mg daily  FEN/GI:  Renal diet; SLIV Protonix  Prophylaxis: heparin SubQ  Disposition: Admit to med surg, attending Dr Nori Riis  Subjective:  Reports some improvement in pain and breathing  today. Pain worse with coughing and movement but not foods. Denies CP.   Objective: Temp:  [98.2 F (36.8 C)-98.3 F (36.8 C)] 98.3 F (36.8 C) (01/15 0606) Pulse Rate:  [49-115] 53 (01/15 0800) Resp:  [17-48] 27 (01/15 0800) BP: (62-128)/(36-110) 113/65 mmHg  (01/15 0800) SpO2:  [91 %-100 %] 91 % (01/15 0800) Weight:  [145 lb 11.6 oz (66.1 kg)] 145 lb 11.6 oz (66.1 kg) (01/15 0600) Physical Exam: Gen: Appears in mild distress from pain and shortness of breath, but O2 sats > 95% Oropharynx: mm appears mildly dry, no oral lesions Neck: supple, no LAD CV: Mild tachycardia, regular rhythm. S1 & S2 audible, no ext edema, no JVD Resp: tachypneic, mild distress from dyspnea and pain; Unable to hear breath sound in RLL. GI: bowel sounds normal, tenderness to light palpation mostly over RUQ and RLQ GU: CVA tenderness on right Skin: no lesions or rash noted Laboratory:  Recent Labs Lab 10/13/15 1013 10/13/15 1023 10/14/15 0414  WBC 11.3*  --  11.7*  HGB 13.5 16.0 12.0*  HCT 41.5 47.0 36.7*  PLT 101*  --  82*    Recent Labs Lab 10/13/15 1013 10/13/15 1023 10/14/15 0414  NA  --  140 138  K  --  4.9 5.7*  CL  --  100* 96*  CO2  --   --  26  BUN  --  36* 26*  CREATININE  --  10.90* 8.65*  CALCIUM  --   --  9.1  PROT 7.6  --  7.3  BILITOT 1.5*  --  1.4*  ALKPHOS 245*  --  155*  ALT 38  --  25  AST 41  --  20  GLUCOSE  --  87 50*   Ddimer: 3.67 Lactic Acid, Venous: 2.8 > 4.7 > 2.6 BNP    Component Value Date/Time   BNP 190.7* 10/13/2015 1013   BNP (last 3 results)  Recent Labs  10/13/15 1013  BNP 190.7*   Imaging/Diagnostic Tests: Ct Abdomen Pelvis Wo Contrast  10/13/2015  ADDENDUM REPORT: 10/13/2015 15:40 ADDENDUM: Patient has a 13 mm gallstone without CT evidence acute cholecystitis, which was noted in the body of the report, but should be included in the impression since it could potentially be the source of patient pain. Electronically Signed   By: Lajean Manes M.D.   On: 10/13/2015 15:40  10/13/2015  CLINICAL DATA:  Pt in severe pain and states he cannot lie flat or he can't breath. States he has pneumonia but now he is very SOB EXAM: CT ABDOMEN AND PELVIS WITHOUT CONTRAST TECHNIQUE: Multidetector CT imaging of the  abdomen and pelvis was performed following the standard protocol without IV contrast. COMPARISON:  Current chest radiograph FINDINGS: The lung bases: Rounded area of consolidation noted in the right lower lobe consistent pneumonia. Bilateral lower lobe irregular peribronchial interstitial densities, also seen to a lesser degree in the right middle lobe. No convincing edema. No pleural effusion. Heart mildly enlarged. The Liver and spleen:  Unremarkable. Gallbladder and biliary tree: 13 mm stone in the lower gallbladder. No gallbladder wall thickening or adjacent inflammation. No bile duct dilation. Pancreas:  Unremarkable. Adrenal glands:  No masses. Kidneys, ureters, bladder: No renal cortical thinning although overall renal sizes are maintained. There are numerous bilateral renal cysts. Scattered calcifications are noted. There is no hydronephrosis. Ureters normal in course and in caliber. Bladder is decompressed. Lymph nodes:  No adenopathy. Ascites:  None. Gastrointestinal: No bowel wall thickening or inflammatory  changes. No evidence obstruction. Musculoskeletal:  No osteoblastic or osteolytic lesions. IMPRESSION: 1. Focal right lower lobe pneumonia. Additional very bronchovascular interstitial opacities are noted both lower lobes and, to a lesser degree, in the right middle lobe consistent with bronchitis. 2. No acute findings below the diaphragm. 3. 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 Electronically Signed: By: Lajean Manes M.D. On: 10/13/2015 13:32   Dg Chest Port 1 View  10/13/2015  CLINICAL DATA:  Short of breath today EXAM: PORTABLE CHEST 1 VIEW COMPARISON:  07/31/2015 FINDINGS: Hazy airspace disease has developed at the right lung base. Normal heart size. Left lung is clear. No pneumothorax. Left axillary vascular stent in place. IMPRESSION: Right basilar airspace disease worrisome for pneumonia. Followup PA and lateral chest X-ray is  recommended in 3-4 weeks following trial of antibiotic therapy to ensure resolution and exclude underlying malignancy. Electronically Signed   By: Marybelle Killings M.D.   On: 10/13/2015 10:27   Olam Idler, MD 10/14/2015, 10:51 AM PGY-3, [place] Intern pager: [phone], text pages welcome

## 2015-10-14 NOTE — Progress Notes (Signed)
ANTICOAGULATION CONSULT NOTE - Initial Consult  Pharmacy Consult for heparin Indication: pulmonary embolus  Allergies  Allergen Reactions  . Contrast Media [Iodinated Diagnostic Agents] Hives and Itching  . Acetaminophen-Codeine Nausea And Vomiting  . Penicillins Hives    Has patient had a PCN reaction causing immediate rash, facial/tongue/throat swelling, SOB or lightheadedness with hypotension: Yes Has patient had a PCN reaction causing severe rash involving mucus membranes or skin necrosis: No Has patient had a PCN reaction that required hospitalization Yes Has patient had a PCN reaction occurring within the last 10 years: No If all of the above answers are "NO", then may proceed with Cephalosporin use.    Patient Measurements: Height: 5\' 11"  (180.3 cm) Weight: 145 lb 11.6 oz (66.1 kg) IBW/kg (Calculated) : 75.3   Vital Signs: Temp: 98.6 F (37 C) (01/15 1200) Temp Source: Oral (01/15 1200) BP: 111/68 mmHg (01/15 1200) Pulse Rate: 53 (01/15 0800)  Labs:  Recent Labs  10/13/15 1013 10/13/15 1023 10/14/15 0414  HGB 13.5 16.0 12.0*  HCT 41.5 47.0 36.7*  PLT 101*  --  82*  CREATININE  --  10.90* 8.65*    Estimated Creatinine Clearance: 9.9 mL/min (by C-G formula based on Cr of 8.65).   Medical History: Past Medical History  Diagnosis Date  . HIV infection (HCC)   . DJD (degenerative joint disease)     right knee  . Tertiary syphilis   . Genital herpes   . Anal condyloma   . Family history of disseminated HSV infection   . Shortness of breath     "can happen at anytime" (03/04/2013)  . ESRD (end stage renal disease) on dialysis (HCC) 01/02/2012    [facility] [address], TTS schedule.  Started dialysis in 2000.     Marland Kitchen. 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 Reclotted on 03/02/13 and IR placed L IJ tunneled HD catheter (a right external jugular tunneled HD cath was attempted  first but did not function due to kinking across the clavicle). Pt was seen by VVS on same admission 6/614 and said RUA AVF no longer usable, they ordered vein mapping and will see him as outpatient to set up next permanent access.     . Hypertension   . Pneumonia     hx of  . Diarrhea     occurs because of dialysis  . Hemodialysis patient [facility](HCC)     Tuesday, Thursday, Saturday    Medications:  Prescriptions prior to admission  Medication Sig Dispense Refill Last Dose  . acetaminophen (TYLENOL) 650 MG CR tablet Take 1,300 mg by mouth every 6 (six) hours as needed for pain.   over 30 days  . albuterol (PROVENTIL HFA;VENTOLIN HFA) 108 (90 BASE) MCG/ACT inhaler Inhale 1-2 puffs into the lungs every 6 (six) hours as needed for wheezing or shortness of breath. 1 Inhaler 0 10/10/2015  . calcium acetate (PHOSLO) 667 MG capsule Take 2,668 mg by mouth See admin instructions. Take 4 capsules (2668 mg) by mouth with meals - once or twice daily   10/11/2015  . Darunavir Ethanolate (PREZISTA) 800 MG tablet Take 1 tablet (800 mg total) by mouth at bedtime. Take with Norvir tablet. 30 tablet 11 10/12/2015 at Unknown time  . ritonavir (NORVIR) 100 MG TABS tablet Take 1 tablet (100 mg total) by mouth at bedtime. Take with Prezista tablet. 30 tablet 11 10/12/2015 at Unknown time  .  tenofovir (VIREAD) 300 MG tablet Take 1 tablet (300 mg total) by mouth once a week. On Sundays (Patient taking differently: Take 300 mg by mouth every Sunday. On Sundays) 4 tablet 11 10/07/2015  . valACYclovir (VALTREX) 500 MG tablet Take 1 tablet (500 mg total) by mouth daily as needed (herpes outbreaks). 30 tablet 5 over 30 days  . HYDROcodone-acetaminophen (NORCO) 5-325 MG per tablet Take 1 tablet by mouth every 6 (six) hours as needed for severe pain. (Patient not taking: Reported on 08/04/2015) 6 tablet 0 Not Taking at Unknown time  . lamiVUDine (EPIVIR) 10 MG/ML solution Take 5 mLs (50 mg total) by mouth daily. (Patient not taking:  Reported on 10/13/2015) 240 mL 11 Not Taking at Unknown time    Assessment: 50 yo HIV + M to start heparin per pharmacy for PE. Pt with hx of chronic thrombocytopenia and PLTC 82 currently - discussed w/ Dr. Nancy Marus.  VQ scan intermediate for PE.  Pt has contrast allergy.  Plan is for CT scan to r/o PE.  MD wants heparin to be started now w/ bolus and will stop heparin if CT neg for PE.  Wt 66 kg, Hg 12, pltc 82.  Goal of Therapy:  Heparin level 0.3-0.7 units/ml Monitor platelets by anticoagulation protocol: Yes   Plan:  - dc sq heparin - heparin 4000 unit bolus and drip at 1000 units/hr - f/u 8 hr HL (pt ESRD) - f/u CT scan results - daily HL/CBC while on heparin  Herby Abraham, Pharm.D. [phone] 10/14/2015 12:33 PM

## 2015-10-14 NOTE — Progress Notes (Signed)
  [facility] Progress Note   Subjective: still in a lot of pain R chest/ upper abd  Filed Vitals:   10/14/15 0606 10/14/15 0800 10/14/15 1200 10/14/15 1417  BP: 114/68 113/65 111/68   Pulse: 104 53    Temp: 98.3 F (36.8 C)  98.6 F (37 C)   TempSrc: Oral  Oral   Resp: 27 27 40   Height:      Weight:      SpO2: 98% 91% 98% 95%    Inpatient medications: . antiseptic oral rinse  7 mL Mouth Rinse BID  . calcium acetate  2,668 mg Oral TID WC  . Darunavir Ethanolate  800 mg Oral QHS  . [START ON 10/15/2015] diphenhydrAMINE  50 mg Oral Once  . [START ON 10/16/2015] doxercalciferol  1 mcg Intravenous Q T,Th,Sa-HD  . feeding supplement (ENSURE ENLIVE)  237 mL Oral BID BM  . [START ON 10/16/2015] ferric gluconate (FERRLECIT/NULECIT) IV  125 mg Intravenous Q T,Th,Sa-HD  . ipratropium-albuterol  3 mL Nebulization Q6H  . [START ON 10/15/2015] levofloxacin (LEVAQUIN) IV  500 mg Intravenous Q48H  . nicotine  7 mg Transdermal Daily  . predniSONE  50 mg Oral Q6H  . ritonavir  100 mg Oral QHS  . tenofovir  300 mg Oral Q Sun  . white petrolatum       . heparin 1,000 Units/hr (10/14/15 1233)   acetaminophen, albuterol, fentaNYL (SUBLIMAZE) injection, ipratropium-albuterol, technetium TC 26M diethylenetriame-pentaacetic acid, valACYclovir  Exam: Remains uncomfortable , in pain No jvd or nodes Chest coarse rales/ rhonchi R lower mostly RRR no mrg Abd soft ntnd no ascites Ext no LE or UE edema LUA AV graft +bruit Neuro is alert, Ox 3  D[facility]: [facility] 3h 45min 67kg 2/3.5Ca bath P4 Heparin none LUA AVG 12.9 / 16% / - > Venofer 100 x 10, 6 left 9.3 / 6.9 / 181 > Hect 1 ug   Assessment: 1 R severe pleuritic chest pain/ + infiltrate - V/Q indeterminate, started on IV heparin for possible PE. PNA vs pulm infarct on CXR. No fever/ wbc.  On abx as well.   2 ESRD on HD tts 3 HIV good counts last measured 4 Anemia on Fe, no esa 5 MBD binders, vit D 6 Recent HD  access procedure    Plan - next HD tuesday   Vinson Moselleob Maeghan Canny MD [facility] pager [phone]    cell [phone] 10/14/2015, 4:42 PM    Recent Labs Lab 10/13/15 1023 10/14/15 0414 10/14/15 1524  NA 140 138 137  K 4.9 5.7* 6.0*  CL 100* 96* 93*  CO2  --  26 26  GLUCOSE 87 50* 64*  BUN 36* 26* 36*  CREATININE 10.90* 8.65* 9.16*  CALCIUM  --  9.1 9.5    Recent Labs Lab 10/13/15 1013 10/14/15 0414  AST 41 20  ALT 38 25  ALKPHOS 245* 155*  BILITOT 1.5* 1.4*  PROT 7.6 7.3  ALBUMIN 3.3* 2.7*    Recent Labs Lab 10/13/15 1013 10/13/15 1023 10/14/15 0414  WBC 11.3*  --  11.7*  NEUTROABS 8.8*  --   --   HGB 13.5 16.0 12.0*  HCT 41.5 47.0 36.7*  MCV 98.1  --  95.8  PLT 101*  --  82*

## 2015-10-14 NOTE — Progress Notes (Signed)
CRITICAL VALUE ALERT  Critical value received:  Lactic Acid 2.6  Date of notification:  10/14/2015   Time of notification:  0550  Critical value read back:Yes.    Nurse who received alert:  Gates RiggM. Tylor Gambrill  MD notified (1st page):  FMTS  Time of first page:  0555  MD notified (2nd page):  Time of second page:  Responding MD:    Time MD responded:

## 2015-10-14 NOTE — Progress Notes (Signed)
2 sutures removed on Right upper arm per MD order, patient tolerated without complications. Patient resting in chair with family at bedside with phone and call-bell within reach. Will continue to monitor closely.

## 2015-10-14 NOTE — Progress Notes (Addendum)
ANTIBIOTIC CONSULT NOTE  Pharmacy Consult for Vancomycin and Cefepime Indication: HCAP Allergies  Allergen Reactions  . Contrast Media [Iodinated Diagnostic Agents] Hives and Itching  . Acetaminophen-Codeine Nausea And Vomiting  . Penicillins Hives    Has patient had a PCN reaction causing immediate rash, facial/tongue/throat swelling, SOB or lightheadedness with hypotension: Yes Has patient had a PCN reaction causing severe rash involving mucus membranes or skin necrosis: No Has patient had a PCN reaction that required hospitalization Yes Has patient had a PCN reaction occurring within the last 10 years: No If all of the above answers are "NO", then may proceed with Cephalosporin use.    Patient Measurements: Height: 5\' 11"  (180.3 cm) Weight: 145 lb 11.6 oz (66.1 kg) IBW/kg (Calculated) : 75.3   Vital Signs: Temp: 97.4 F (36.3 C) (01/15 1724) Temp Source: Oral (01/15 1724) BP: 119/77 mmHg (01/15 1724) Pulse Rate: 93 (01/15 1724) Intake/Output from previous day: 01/14 0701 - 01/15 0700 In: 500 [I.V.:500] Out: 0  Intake/Output from this shift: Total I/O In: 720 [P.O.:720] Out: -   Labs:  Recent Labs  10/13/15 1013 10/13/15 1023 10/14/15 0414 10/14/15 1524  WBC 11.3*  --  11.7*  --   HGB 13.5 16.0 12.0*  --   PLT 101*  --  82*  --   CREATININE  --  10.90* 8.65* 9.16*   Estimated Creatinine Clearance: 9.3 mL/min (by C-G formula based on Cr of 9.16). No results for input(s): VANCOTROUGH, VANCOPEAK, VANCORANDOM, GENTTROUGH, GENTPEAK, GENTRANDOM, TOBRATROUGH, TOBRAPEAK, TOBRARND, AMIKACINPEAK, AMIKACINTROU, AMIKACIN in the last 72 hours.   Microbiology: Recent Results (from the past 720 hour(s))  Blood culture (routine x 2)     Status: None (Preliminary result)   Collection Time: 10/13/15 11:08 AM  Result Value Ref Range Status   Specimen Description BLOOD LEFT ANTECUBITAL  Final   Special Requests BOTTLES DRAWN AEROBIC ONLY 10CC  Final   Culture NO GROWTH 1 DAY   Final   Report Status PENDING  Incomplete  Blood culture (routine x 2)     Status: None (Preliminary result)   Collection Time: 10/13/15 11:16 AM  Result Value Ref Range Status   Specimen Description BLOOD LEFT HAND  Final   Special Requests BOTTLES DRAWN AEROBIC AND ANAEROBIC 10CC  Final   Culture NO GROWTH 1 DAY  Final   Report Status PENDING  Incomplete  MRSA PCR Screening     Status: None   Collection Time: 10/14/15  5:59 AM  Result Value Ref Range Status   MRSA by PCR NEGATIVE NEGATIVE Final    Comment:        The GeneXpert MRSA Assay (FDA approved for NASAL specimens only), is one component of a comprehensive MRSA colonization surveillance program. It is not intended to diagnose MRSA infection nor to guide or monitor treatment for MRSA infections.      . heparin 1,000 Units/hr (10/14/15 1233)   Assessment: 50yo male with history of ESRD on HD (TTS), HIV on HAART and HSV presents with SOB and cough. Pharmacy consulted to dose levaquin for CAP. Pt is afebrile, WBC 11.7, LA 2.6 Vanc/zosyn ordered this morning but changed back to lvq.  Pt with allergy to PCN = itching.  Pharmacy is consulted to change back to vancomycin and cefepime for HCAP. Pt is afebrile, WBC 11.7, sCr 9.16 with next HD planned for Tues.  Goal of Therapy:  Vancomycin pre-HD level 15-25 mcg/ml  Plan:  Vancomycin 1250mg  IV once followed by 750mg  qHD Cefepime 2g  IV qHD Expected duration 5 days with resolution of temperature and/or normalization of WBC Follow up culture results, HD plans and clinical course  Arlean Hopping. Newman Pies, PharmD, BCPS Clinical Pharmacist Pager [phone] 10/14/2015 5:52 PM

## 2015-10-14 NOTE — Progress Notes (Signed)
CRITICAL VALUE ALERT  Critical value received:  Lactic Acid 3.1  Date of notification:  10-14-15  Time of notification:  1306  Critical value read back:Yes.    Nurse who received alert:  Ronna PolioJennifer Kennesha Brewbaker, RN  MD notified (1st page): Mayo  Time of first page:  1306  MD notified (2nd page):  Time of second page:  Responding MD:  Mayo  Time MD responded:  [phone]

## 2015-10-14 NOTE — ED Notes (Signed)
Spoke with Dr.Gambino concerning pt flank pain; Md will review meds and place orders; Dr.Gambino aware that pt has not had VQ scan yet, Md ok with waiting til VQ  Scan team arrive at 7:30am for scan to be completed

## 2015-10-14 NOTE — Consult Note (Signed)
[facility]    Date of Admission:  10/13/2015           Day 2 levofloxacin       Reason for Consult: Right lower lobe pneumonia with pleurisy    Referring Physician: Dr. Wenda Low  Principal Problem:   Right lower lobe pneumonia Active Problems:   Human immunodeficiency virus (HIV) disease (HCC)   CIGARETTE SMOKER   ESRD (end stage renal disease) on dialysis   Normocytic anemia   Thrombocytopenia (HCC)   . antiseptic oral rinse  7 mL Mouth Rinse BID  . calcium acetate  2,668 mg Oral TID WC  . Darunavir Ethanolate  800 mg Oral QHS  . [START ON 10/15/2015] diphenhydrAMINE  50 mg Oral Once  . [START ON 10/16/2015] doxercalciferol  1 mcg Intravenous Q T,Th,Sa-HD  . feeding supplement (ENSURE ENLIVE)  237 mL Oral BID BM  . [START ON 10/16/2015] ferric gluconate (FERRLECIT/NULECIT) IV  125 mg Intravenous Q T,Th,Sa-HD  . ipratropium-albuterol  3 mL Nebulization Q6H  . [START ON 10/15/2015] levofloxacin (LEVAQUIN) IV  500 mg Intravenous Q48H  . nicotine  7 mg Transdermal Daily  . predniSONE  50 mg Oral Q6H  . ritonavir  100 mg Oral QHS  . tenofovir  300 mg Oral Q Sun  . white petrolatum        Recommendations: 1. Change levofloxacin to IV vancomycin and cefepime 2. Sputum culture   Assessment: Solan appears to have right lower lobe pneumonia with pleurisy and some hemoptysis. It is also possible that he could have a pulmonary embolus with pulmonary infarction. So far his blood cultures are negative at 24 hours. I will order sputum for Gram stain and culture and broaden his antibiotic therapy to vancomycin and cefepime. We will follow with you.    HPI: GRAYSEN WOODYARD is a 50 y.o. male with well-controlled HIV infection and end-stage renal disease on hemodialysis. He underwent some minor surgery on his right arm fistula on 10/11/2015. The following day he had sudden onset of subjective fevers, chills cough and right-sided chest pain. He  would get very short of breath when coughing. He continued to worsen and was brought to the hospital yesterday by EMS. He has been afebrile but chest x-ray and CT confirmed a dense, pleural-based right lower lobe infiltrate. He has been coughing up blood-tinged sputum.  Review of Systems: Review of Systems  Constitutional: Positive for fever, chills and malaise/fatigue. Negative for weight loss and diaphoresis.  HENT: Negative for sore throat.   Respiratory: Positive for cough, hemoptysis, sputum production and shortness of breath. Negative for wheezing.   Cardiovascular: Positive for chest pain.  Gastrointestinal: Negative for nausea, vomiting, abdominal pain and diarrhea.  Skin: Negative for rash.  Neurological: Negative for headaches.  Psychiatric/Behavioral: Negative for depression and substance abuse.    Past Medical History  Diagnosis Date  . HIV infection (HCC)   . DJD (degenerative joint disease)     right knee  . Tertiary syphilis   . Genital herpes   . Anal condyloma   . Family history of disseminated HSV infection   . Shortness of breath     "can happen at anytime" (03/04/2013)  . ESRD (end stage renal disease) on dialysis (HCC) 01/02/2012    Gets diaysis at [facility] on [address], TTS schedule.  Started dialysis in 2000.     Marland Kitchen Thrombosis of dialysis vascular access (HCC) 01/02/2012  RUA AV fistula clotted on 02/27/13, declotted by IR. Reclotted on 03/02/13 and IR placed L IJ tunneled HD catheter (a right external jugular tunneled HD cath was attempted first but did not function due to kinking across the clavicle). Pt was seen by VVS on same admission 6/614 and said RUA AVF no longer usable, they ordered vein mapping and will see him as outpatient to set up next permanent access.     . Hypertension   . Pneumonia     hx of  . Diarrhea     occurs because of dialysis  . Hemodialysis patient [facility])     Tuesday, Thursday, Saturday    Social History  Substance Use Topics  .  Smoking status: Current Every Day Smoker -- 0.50 packs/day for 27 years    Types: Cigarettes  . Smokeless tobacco: Never Used     Comment: cutting back from last visit  . Alcohol Use: 0.0 oz/week    0 Standard drinks or equivalent per week     Comment: 03/04/2013 "stopped drinking ~ 12 yr ago; never had problem w/it"    Family History  Problem Relation Age of Onset  . Diabetes Mother   . Arthritis Father     Gout- Foot   Allergies  Allergen Reactions  . Contrast Media [Iodinated Diagnostic Agents] Hives and Itching  . Acetaminophen-Codeine Nausea And Vomiting  . Penicillins Hives    Has patient had a PCN reaction causing immediate rash, facial/tongue/throat swelling, SOB or lightheadedness with hypotension: Yes Has patient had a PCN reaction causing severe rash involving mucus membranes or skin necrosis: No Has patient had a PCN reaction that required hospitalization Yes Has patient had a PCN reaction occurring within the last 10 years: No If all of the above answers are "NO", then may proceed with Cephalosporin use.    OBJECTIVE: Blood pressure 119/77, pulse 93, temperature 97.4 F (36.3 C), temperature source Oral, resp. rate 38, height 5\' 11"  (1.803 m), weight 145 lb 11.6 oz (66.1 kg), SpO2 98 %.  Physical Exam  Constitutional: He is oriented to person, place, and time. He appears distressed.  He is sitting up in a chair. His wife is with him.  HENT:  Mouth/Throat: No oropharyngeal exudate.  Cardiovascular: Regular rhythm.   No murmur heard. Tachycardic.  Pulmonary/Chest: He is in respiratory distress.  He is in moderate distress and has increase work of breathing. He appears to be in pain. He has diminished breath sounds in the right base posteriorly with egophony. I do not hear a rub.  Abdominal: Soft. There is no tenderness.  Neurological: He is alert and oriented to person, place, and time.  Skin:  There may be some very faint erythema over the distal portion of his  fistula in his right upper arm.  Psychiatric: Mood and affect normal.    Lab Results Lab Results  Component Value Date   WBC 11.7* 10/14/2015   HGB 12.0* 10/14/2015   HCT 36.7* 10/14/2015   MCV 95.8 10/14/2015   PLT 82* 10/14/2015    Lab Results  Component Value Date   CREATININE 9.16* 10/14/2015   BUN 36* 10/14/2015   NA 137 10/14/2015   K 6.0* 10/14/2015   CL 93* 10/14/2015   CO2 26 10/14/2015    Lab Results  Component Value Date   ALT 25 10/14/2015   AST 20 10/14/2015   ALKPHOS 155* 10/14/2015   BILITOT 1.4* 10/14/2015    HIV 1 RNA QUANT (copies/mL)  Date Value  07/05/2015 <20  07/20/2014 <20  07/26/2013 115*   CD4 T CELL ABS (/uL)  Date Value  07/05/2015 920  07/20/2014 1030  07/26/2013 550    Microbiology: Recent Results (from the past 240 hour(s))  Blood culture (routine x 2)     Status: None (Preliminary result)   Collection Time: 10/13/15 11:08 AM  Result Value Ref Range Status   Specimen Description BLOOD LEFT ANTECUBITAL  Final   Special Requests BOTTLES DRAWN AEROBIC ONLY 10CC  Final   Culture NO GROWTH 1 DAY  Final   Report Status PENDING  Incomplete  Blood culture (routine x 2)     Status: None (Preliminary result)   Collection Time: 10/13/15 11:16 AM  Result Value Ref Range Status   Specimen Description BLOOD LEFT HAND  Final   Special Requests BOTTLES DRAWN AEROBIC AND ANAEROBIC 10CC  Final   Culture NO GROWTH 1 DAY  Final   Report Status PENDING  Incomplete  MRSA PCR Screening     Status: None   Collection Time: 10/14/15  5:59 AM  Result Value Ref Range Status   MRSA by PCR NEGATIVE NEGATIVE Final    Comment:        The GeneXpert MRSA Assay (FDA approved for NASAL specimens only), is one component of a comprehensive MRSA colonization surveillance program. It is not intended to diagnose MRSA infection nor to guide or monitor treatment for MRSA infections.     Cliffton AstersJohn Harumi Yamin, MD [facility] [phone] pager   [phone] cell 10/14/2015, 5:35 PM

## 2015-10-14 NOTE — Progress Notes (Signed)
CRITICAL VALUE ALERT  Critical value received:  Lactic Acid  Date of notification:  2.8  Time of notification:  1623  Critical value read back:Yes.    Nurse who received alert:  Ronna PolioJennifer Cieanna Stormes  MD notified (1st page):  [facility]  Time of first page:  1625  MD notified (2nd page):  Time of second page:  Responding MD:  Mayo Time MD responded: [phone]

## 2015-10-14 NOTE — Progress Notes (Signed)
ANTICOAGULATION CONSULT NOTE - Follow Up Consult  Pharmacy Consult for heparin Indication: r/o PE   Labs:  Recent Labs  10/13/15 1013 10/13/15 1023 10/14/15 0414 10/14/15 1524 10/14/15 2301  HGB 13.5 16.0 12.0*  --   --   HCT 41.5 47.0 36.7*  --   --   PLT 101*  --  82*  --   --   HEPARINUNFRC  --   --   --   --  0.10*  CREATININE  --  10.90* 8.65* 9.16*  --     Assessment: 49yo male subtherapeutic on heparin with initial dosing for possible PE.  Goal of Therapy:  Heparin level 0.3-0.7 units/ml   Plan:  Will rebolus with heparin 2000 units and increase gtt by 4 units/kg/hr to 1250 units/hr and check level in 8hr.  [name], PharmD, BCPS  10/14/2015,11:28 PM

## 2015-10-14 NOTE — Progress Notes (Signed)
Patient has been resting comfortably in chair since return from v/q scan. Educated patient and wife regarding medication regimen, and that they must call staff for assistance before moving from the chair. Patient maintaining oxygen saturations WNL with 2L O2 via nasal cannula. Will continue to monitor closely.

## 2015-10-15 ENCOUNTER — Other Ambulatory Visit (HOSPITAL_COMMUNITY): Payer: Medicare Other

## 2015-10-15 ENCOUNTER — Encounter (HOSPITAL_COMMUNITY): Payer: Self-pay | Admitting: Radiology

## 2015-10-15 ENCOUNTER — Inpatient Hospital Stay (HOSPITAL_COMMUNITY): Payer: Medicare Other

## 2015-10-15 DIAGNOSIS — J984 Other disorders of lung: Secondary | ICD-10-CM

## 2015-10-15 DIAGNOSIS — R918 Other nonspecific abnormal finding of lung field: Secondary | ICD-10-CM

## 2015-10-15 DIAGNOSIS — Z87891 Personal history of nicotine dependence: Secondary | ICD-10-CM

## 2015-10-15 DIAGNOSIS — R0600 Dyspnea, unspecified: Secondary | ICD-10-CM

## 2015-10-15 DIAGNOSIS — Z21 Asymptomatic human immunodeficiency virus [HIV] infection status: Secondary | ICD-10-CM

## 2015-10-15 LAB — COMPREHENSIVE METABOLIC PANEL
ALBUMIN: 2.7 g/dL — AB (ref 3.5–5.0)
ALT: 21 U/L (ref 17–63)
AST: 19 U/L (ref 15–41)
Alkaline Phosphatase: 160 U/L — ABNORMAL HIGH (ref 38–126)
Anion gap: 13 (ref 5–15)
BUN: 54 mg/dL — AB (ref 6–20)
CALCIUM: 9.8 mg/dL (ref 8.9–10.3)
CHLORIDE: 97 mmol/L — AB (ref 101–111)
CO2: 24 mmol/L (ref 22–32)
CREATININE: 10.25 mg/dL — AB (ref 0.61–1.24)
GFR, EST AFRICAN AMERICAN: 6 mL/min — AB (ref >60)
GFR, EST NON AFRICAN AMERICAN: 5 mL/min — AB (ref >60)
Glucose, Bld: 90 mg/dL (ref 65–99)
Potassium: 6.4 mmol/L (ref 3.5–5.1)
SODIUM: 134 mmol/L — AB (ref 135–145)
Total Bilirubin: 1.7 mg/dL — ABNORMAL HIGH (ref 0.3–1.2)
Total Protein: 7.8 g/dL (ref 6.5–8.1)

## 2015-10-15 LAB — CBC
HCT: 35.4 % — ABNORMAL LOW (ref 39.0–52.0)
Hemoglobin: 11.8 g/dL — ABNORMAL LOW (ref 13.0–17.0)
MCH: 31.5 pg (ref 26.0–34.0)
MCHC: 33.3 g/dL (ref 30.0–36.0)
MCV: 94.4 fL (ref 78.0–100.0)
PLATELETS: 101 10*3/uL — AB (ref 150–400)
RBC: 3.75 MIL/uL — ABNORMAL LOW (ref 4.22–5.81)
RDW: 14.6 % (ref 11.5–15.5)
WBC: 11.9 10*3/uL — ABNORMAL HIGH (ref 4.0–10.5)

## 2015-10-15 LAB — LACTIC ACID, PLASMA: Lactic Acid, Venous: 1.9 mmol/L (ref 0.5–2.0)

## 2015-10-15 LAB — EXPECTORATED SPUTUM ASSESSMENT W REFEX TO RESP CULTURE

## 2015-10-15 LAB — EXPECTORATED SPUTUM ASSESSMENT W GRAM STAIN, RFLX TO RESP C

## 2015-10-15 LAB — MAGNESIUM: MAGNESIUM: 1.9 mg/dL (ref 1.7–2.4)

## 2015-10-15 LAB — HEPARIN LEVEL (UNFRACTIONATED): HEPARIN UNFRACTIONATED: 0.35 [IU]/mL (ref 0.30–0.70)

## 2015-10-15 LAB — PROCALCITONIN

## 2015-10-15 MED ORDER — LAMIVUDINE 10 MG/ML PO SOLN
50.0000 mg | Freq: Every day | ORAL | Status: DC
  Filled 2015-10-15: qty 5

## 2015-10-15 MED ORDER — FENTANYL CITRATE (PF) 100 MCG/2ML IJ SOLN
50.0000 ug | INTRAMUSCULAR | Status: DC | PRN
  Administered 2015-10-15 – 2015-10-17 (×13): 50 ug via INTRAVENOUS
  Filled 2015-10-15 (×14): qty 2

## 2015-10-15 MED ORDER — SODIUM POLYSTYRENE SULFONATE 15 GM/60ML PO SUSP
30.0000 g | Freq: Once | ORAL | Status: DC
  Filled 2015-10-15: qty 120

## 2015-10-15 MED ORDER — NEPRO/CARBSTEADY PO LIQD
237.0000 mL | Freq: Two times a day (BID) | ORAL | Status: DC
  Administered 2015-10-17: 237 mL via ORAL

## 2015-10-15 MED ORDER — HEPARIN SODIUM (PORCINE) 1000 UNIT/ML DIALYSIS: 20.0000 [IU]/kg | INTRAMUSCULAR | Status: DC | PRN

## 2015-10-15 MED ORDER — HEPARIN SODIUM (PORCINE) 5000 UNIT/ML IJ SOLN: 5000.0000 [IU] | Freq: Three times a day (TID) | INTRAMUSCULAR | Status: DC

## 2015-10-15 MED ORDER — IOHEXOL 350 MG/ML SOLN
80.0000 mL | Freq: Once | INTRAVENOUS | Status: AC | PRN
  Administered 2015-10-15: 80 mL via INTRAVENOUS

## 2015-10-15 NOTE — Progress Notes (Signed)
CT scan reviewed, cavitary area of rll, lobular collapse, large volume aspiration.  Otherwise remains afebrile, on nasal cannula but at 100% so does not appear to be an acute process. I recommend pulmonary evaluation, ? Chronic process, ? Aspiration, ? Fungal, mycobacterrial. ?BAL Likely related to smoking history.

## 2015-10-15 NOTE — Procedures (Signed)
Patient was seen on dialysis and the procedure was supervised.  BFR 400  Via AVG BP is  119/58.   Patient appears to be tolerating treatment well- extra tx today due to high K   [name] A 10/15/2015

## 2015-10-15 NOTE — Progress Notes (Signed)
[facility] Progress Note   Subjective: says feels better- K has been high yesterday and this AM ?  Had HD on Sat but low tx time and low BFR   Filed Vitals:   10/14/15 1724 10/14/15 2052 10/15/15 0000 10/15/15 0200  BP: 119/77  135/81 125/74  Pulse: 93  110 100  Temp: 97.4 F (36.3 C)  97.9 F (36.6 C)   TempSrc: Oral  Oral   Resp: 38  29 29  Height:      Weight:      SpO2: 98% 100% 99% 100%    Inpatient medications: . antiseptic oral rinse  7 mL Mouth Rinse BID  . calcium acetate  2,668 mg Oral TID WC  . [START ON 10/16/2015] ceFEPime (MAXIPIME) IV  2 g Intravenous Q T,Th,Sa-HD  . Darunavir Ethanolate  800 mg Oral QHS  . diphenhydrAMINE  50 mg Oral Once  . [START ON 10/16/2015] doxercalciferol  1 mcg Intravenous Q T,Th,Sa-HD  . feeding supplement (ENSURE ENLIVE)  237 mL Oral BID BM  . [START ON 10/16/2015] ferric gluconate (FERRLECIT/NULECIT) IV  125 mg Intravenous Q T,Th,Sa-HD  . ipratropium-albuterol  3 mL Nebulization Q6H  . nicotine  7 mg Transdermal Daily  . predniSONE  50 mg Oral Q6H  . ritonavir  100 mg Oral QHS  . sodium polystyrene  30 g Oral Once  . tenofovir  300 mg Oral Q Sun  . [START ON 10/16/2015] vancomycin  750 mg Intravenous Q T,Th,Sa-HD   . heparin 1,250 Units/hr (10/14/15 2348)   acetaminophen, albuterol, fentaNYL (SUBLIMAZE) injection, ipratropium-albuterol, technetium TC 27M diethylenetriame-pentaacetic acid, valACYclovir  Exam: Minimal distress No jvd or nodes Chest coarse rales/ rhonchi R lower mostly RRR no mrg Abd soft ntnd no ascites Ext no LE or UE edema LUA AV graft +bruit Neuro is alert, Ox 3  D[facility]: [facility] 67kg 2/3.5Ca bath P4 Heparin none LUA AVG 12.9 / 16% / - > Venofer 100 x 10, 6 left 9.3 / 6.9 / 181 > Hect 1 ug   Assessment: 1 R severe pleuritic chest pain/ + infiltrate - V/Q indeterminate, started on IV heparin for possible PE. PNA vs pulm infarct on CXR. No fever/ wbc.  On  vanc/cefepime as well.  Is going to get CT angiogram today  2 ESRD- normally on HD tts via AVG- high K today- I think combo of him being catabolic plus seems like inadequate HD on Sat- will do HD today- attempt for M, Thursday, Sat this week ? If possible- 1 K bath  3 HIV good counts last measured 4 Anemia on Fe, no esa- hgb is dropping 5 MBD binders, vit D 6 Recent HD access procedure      [name] A  10/15/2015, 8:10 AM    Recent Labs Lab 10/14/15 0414 10/14/15 1524 10/15/15 0545  NA 138 137 134*  K 5.7* 6.0* 6.4*  CL 96* 93* 97*  CO2 26 26 24   GLUCOSE 50* 64* 90  BUN 26* 36* 54*  CREATININE 8.65* 9.16* 10.25*  CALCIUM 9.1 9.5 9.8    Recent Labs Lab 10/13/15 1013 10/14/15 0414 10/15/15 0545  AST 41 20 19  ALT 38 25 21  ALKPHOS 245* 155* 160*  BILITOT 1.5* 1.4* 1.7*  PROT 7.6 7.3 7.8  ALBUMIN 3.3* 2.7* 2.7*    Recent Labs Lab 10/13/15 1013 10/13/15 1023 10/14/15 0414 10/15/15 0545  WBC 11.3*  --  11.7* 11.9*  NEUTROABS 8.8*  --   --   --  HGB 13.5 16.0 12.0* 11.8*  HCT 41.5 47.0 36.7* 35.4*  MCV 98.1  --  95.8 94.4  PLT 101*  --  82* 101*

## 2015-10-15 NOTE — Progress Notes (Signed)
Discussed with Dr Vassie LollAlva, patient does not need to be on TB precautions/ negative pressure room, as TB is not high on DDx.

## 2015-10-15 NOTE — Progress Notes (Signed)
    [facility]   Reason for visit: Follow up on pneumonia  Interval History: he remains afebrile.  Has not been on home lamivudine. MRSA screen negative.   Physical Exam: Constitutional:  Filed Vitals:   10/15/15 0200 10/15/15 0800  BP: 125/74 128/87  Pulse: 100 87  Temp:  97.6 F (36.4 C)  Resp: 29 22   patient appears in NAD  Impression: Stable pneumonia.  HIV well-controlled  Plan: 1.  Will stop vancomycin.  2. Add back lamivudine

## 2015-10-15 NOTE — Progress Notes (Addendum)
Initial Nutrition Assessment  DOCUMENTATION CODES:   Not applicable  INTERVENTION:   Nepro Shake po BID, each supplement provides 425 kcal and 19 grams protein  NUTRITION DIAGNOSIS:   Increased nutrient needs related to chronic illness, catabolic illness as evidenced by estimated needs  GOAL:   Patient will meet greater than or equal to 90% of their needs  MONITOR:   PO intake, Supplement acceptance, Labs, Weight trends, I & O's  REASON FOR ASSESSMENT:   Malnutrition Screening Tool  ASSESSMENT:   50 y.o. Male with PMH of HIV on HAART, ESRD on HD (TTHSA), HSV, smoking, depression, peripheral neuropathy; presented with shortness of breath, chest pain.  Patient sleeping upon RD visit.  Spoke with pt's wife at bedside.  Reports he had breakfast this AM.  PO intake 50-75% per flowsheet records.  Typically consumes 2-3 meals per day.  Weight has been stable.  Nutrient needs increased given chronic/catabolic illness.  Currently has Ensure Enlive ordered.  Given patient on HD, will [facility] to [facility] for more therapeutic nutrition.  RD unable to complete Nutrition Focused Physical Exam at this time.  Diet Order:  Diet renal with fluid restriction Fluid restriction:: 1200 mL Fluid; Room service appropriate?: Yes; Fluid consistency:: Thin  Skin:  Reviewed, no issues  Last BM:  1/14  Height:   Ht Readings from Last 1 Encounters:  10/14/15 5\' 11"  (1.803 m)    Weight:   Wt Readings from Last 1 Encounters:  10/14/15 145 lb 11.6 oz (66.1 kg)    Ideal Body Weight:  78.1 kg  BMI:  Body mass index is 20.33 kg/(m^2).  Estimated Nutritional Needs:   Kcal:  1800-2000  Protein:  90-100 gm  Fluid:  1200 ml  EDUCATION NEEDS:   No education needs identified at this time  Maureen ChattersKatie Jehiel Koepp, RD, LDN Pager #: [phone] After-Hours Pager #: [phone]

## 2015-10-15 NOTE — Progress Notes (Addendum)
ANTICOAGULATION CONSULT NOTE - Initial Consult  Pharmacy Consult for heparin Indication: pulmonary embolus  Allergies  Allergen Reactions  . Contrast Media [Iodinated Diagnostic Agents] Hives and Itching  . Acetaminophen-Codeine Nausea And Vomiting  . Penicillins Hives    Has patient had a PCN reaction causing immediate rash, facial/tongue/throat swelling, SOB or lightheadedness with hypotension: Yes Has patient had a PCN reaction causing severe rash involving mucus membranes or skin necrosis: No Has patient had a PCN reaction that required hospitalization Yes Has patient had a PCN reaction occurring within the last 10 years: No If all of the above answers are "NO", then may proceed with Cephalosporin use.    Patient Measurements: Height: 5\' 11"  (180.3 cm) Weight: 145 lb 11.6 oz (66.1 kg) IBW/kg (Calculated) : 75.3   Vital Signs: Temp: 97.6 F (36.4 C) (01/16 0800) Temp Source: Oral (01/16 0800) BP: 128/87 mmHg (01/16 0800) Pulse Rate: 87 (01/16 0800)  Labs:  Recent Labs  10/13/15 1013  10/13/15 1023 10/14/15 0414 10/14/15 1524 10/14/15 2301 10/15/15 0545  HGB 13.5  --  16.0 12.0*  --   --  11.8*  HCT 41.5  --  47.0 36.7*  --   --  35.4*  PLT 101*  --   --  82*  --   --  101*  HEPARINUNFRC  --   --   --   --   --  0.10* 0.35  CREATININE  --   < > 10.90* 8.65* 9.16*  --  10.25*  < > = values in this interval not displayed.  Estimated Creatinine Clearance: 8.3 mL/min (by C-G formula based on Cr of 10.25).   Medical History: Past Medical History  Diagnosis Date  . HIV infection (HCC)   . DJD (degenerative joint disease)     right knee  . Tertiary syphilis   . Genital herpes   . Anal condyloma   . Family history of disseminated HSV infection   . Shortness of breath     "can happen at anytime" (03/04/2013)  . ESRD (end stage renal disease) on dialysis (HCC) 01/02/2012    [facility] [address], TTS schedule.  Started dialysis in 2000.     Marland Kitchen.  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 Reclotted on 03/02/13 and IR placed L IJ tunneled HD catheter (a right external jugular tunneled HD cath was attempted first but did not function due to kinking across the clavicle). Pt was seen by [facility] on same admission 6/614 and said RUA AVF no longer usable, they ordered vein mapping and will see him as outpatient to set up next permanent access.     . Hypertension   . Pneumonia     hx of  . Diarrhea     occurs because of dialysis  . Hemodialysis patient [facility](HCC)     Tuesday, Thursday, Saturday    Medications:  Prescriptions prior to admission  Medication Sig Dispense Refill Last Dose  . acetaminophen (TYLENOL) 650 MG CR tablet Take 1,300 mg by mouth every 6 (six) hours as needed for pain.   over 30 days  . albuterol (PROVENTIL HFA;VENTOLIN HFA) 108 (90 BASE) MCG/ACT inhaler Inhale 1-2 puffs into the lungs every 6 (six) hours as needed for wheezing or shortness of breath. 1 Inhaler 0 10/10/2015  . calcium acetate (PHOSLO) 667 MG capsule Take 2,668 mg by mouth See admin instructions. Take 4 capsules (2668  mg) by mouth with meals - once or twice daily   10/11/2015  . Darunavir Ethanolate (PREZISTA) 800 MG tablet Take 1 tablet (800 mg total) by mouth at bedtime. Take with Norvir tablet. 30 tablet 11 10/12/2015 at Unknown time  . ritonavir (NORVIR) 100 MG TABS tablet Take 1 tablet (100 mg total) by mouth at bedtime. Take with Prezista tablet. 30 tablet 11 10/12/2015 at Unknown time  . tenofovir (VIREAD) 300 MG tablet Take 1 tablet (300 mg total) by mouth once a week. On Sundays (Patient taking differently: Take 300 mg by mouth every Sunday. On Sundays) 4 tablet 11 10/07/2015  . valACYclovir (VALTREX) 500 MG tablet Take 1 tablet (500 mg total) by mouth daily as needed (herpes outbreaks). 30 tablet 5 over 30 days  . HYDROcodone-acetaminophen (NORCO) 5-325 MG per tablet Take 1 tablet by mouth every 6  (six) hours as needed for severe pain. (Patient not taking: Reported on 08/04/2015) 6 tablet 0 Not Taking at Unknown time  . lamiVUDine (EPIVIR) 10 MG/ML solution Take 5 mLs (50 mg total) by mouth daily. (Patient not taking: Reported on 10/13/2015) 240 mL 11 Not Taking at Unknown time    Assessment: 50 yo HIV + M to start heparin per pharmacy for PE. VQ scan was intermediate for PE. Chronic thrombocytopenia, plts 101. Hgb low but stable. Last HL is therapeutic at 0.35. No s/s of bleed or issues noted.  Goal of Therapy:  Heparin level 0.3-0.7 units/ml Monitor platelets by anticoagulation protocol: Yes   Plan:  Continue heparin gtt at 1,250 units/hr Check 8 hr confirmatory HL Monitor daily HL, CBC, s/s of bleed F/U CT scan results  Enzo Bi, PharmD, BCPS Clinical Pharmacist Pager [phone] 10/15/2015 8:24 AM   ADDENDUM:  CT came back negative for PE. Heparin gtt stopped per MD

## 2015-10-15 NOTE — Progress Notes (Signed)
Utilization review completed. Yoniel Arkwright, RN, BSN. 

## 2015-10-15 NOTE — Consult Note (Signed)
Name: [name] MRN: [id number] DOB: Feb 10, 1966    ADMISSION DATE:  10/13/2015 CONSULTATION DATE:  1/16  REFERRING MD :  Lenise Herald  CHIEF COMPLAINT:  SOB  BRIEF PATIENT DESCRIPTION: 50 year old male with history of HIV on HAART and ESRD admitted 1/14  treated for CAP > broadened to Vanc/Zosyn per ID recs. PE considered part of differential. CTA 1/16 neg for PE, however, RLL cavitary PNA identified. PCCM consulted for further eval.   SIGNIFICANT EVENTS  01/14: Chest pain and SOB>ED>admitted with CAP 01/16: CTA chest>Cavitary pneumonia in the right lower lobe and extensive airway debris/large volume aspiration  STUDIES:  2- D echo 01/15>>  HISTORY OF PRESENT ILLNESS: This is a 50 yo male with a h/o HIV on HAART (last CD4 920 and undetectable viral load), ESRD on HD and hypertension who presented to the ED with a cough that is productive of yellow sputum x 2 weeks, hemoptysis, diarrhea, low grade fever and chest pain. He described chest pain as radiating to his back and also reported RUQ/RLQ abdominal pain. He denied any recent foreign travel, and sick contacts.  His initial CXR showed right basilar infiltrate.There was also concern for a PE hence a CTA chest and VQ scan were both ordered and he was started on heparin. His VQ scan indiated an intermediate probability for pulmonary embolism but his CTA chest was negative.  He was started on cefepime and vanco and seen by ID. ID recommended changing to vanco and zosyn. His CTA chest today shows cavitary pneumonia in the right lower lobe,  large volume debris and lobar collapse. PCCM was consulted to assist with further diagnostic work-up and management.  He is a current smoker and smokes 1/2 pk/day.   PAST MEDICAL HISTORY :   has a past medical history of HIV infection (HCC); DJD (degenerative joint disease); Tertiary syphilis; Genital herpes; Anal condyloma; Family history of disseminated HSV infection; Shortness of breath; ESRD  (end stage renal disease) on dialysis (HCC) (01/02/2012); Thrombosis of dialysis vascular access (HCC) (01/02/2012); Hypertension; Pneumonia; Diarrhea; and Hemodialysis patient (HCC).  has past surgical history that includes AV fistula, brachiocephalic (Right, 40/98/11); Appendectomy; Tonsillectomy; Cholecystectomy; Incision and drainage abscess; AV fistula placement (Right, 06/16/11); and Revison of arteriovenous fistula (Right, 04/20/2013). Prior to Admission medications   Medication Sig Start Date End Date Taking? Authorizing Provider  acetaminophen (TYLENOL) 650 MG CR tablet Take 1,300 mg by mouth every 6 (six) hours as needed for pain.   Yes Historical Provider, MD  albuterol (PROVENTIL HFA;VENTOLIN HFA) 108 (90 BASE) MCG/ACT inhaler Inhale 1-2 puffs into the lungs every 6 (six) hours as needed for wheezing or shortness of breath. 09/13/13  Yes Purvis Sheffield, MD  calcium acetate (PHOSLO) 667 MG capsule Take 2,668 mg by mouth See admin instructions. Take 4 capsules (2668 mg) by mouth with meals - once or twice daily   Yes Historical Provider, MD  Darunavir Ethanolate (PREZISTA) 800 MG tablet Take 1 tablet (800 mg total) by mouth at bedtime. Take with Norvir tablet. 08/17/15  Yes Cliffton [name], MD  ritonavir (NORVIR) 100 MG TABS tablet Take 1 tablet (100 mg total) by mouth at bedtime. Take with Prezista tablet. 08/17/15  Yes Cliffton [name], MD  tenofovir (VIREAD) 300 MG tablet Take 1 tablet (300 mg total) by mouth once a week. On Sundays Patient taking differently: Take 300 mg by mouth every Sunday. On Sundays 08/17/15  Yes Cliffton [name], MD  valACYclovir (VALTREX) 500 MG tablet Take 1 tablet (500 mg total)  by mouth daily as needed (herpes outbreaks). 06/05/15  Yes Cliffton AstersJohn Campbell, MD  HYDROcodone-acetaminophen (NORCO) 5-325 MG per tablet Take 1 tablet by mouth every 6 (six) hours as needed for severe pain. Patient not taking: Reported on 08/04/2015 02/28/15   Mercedes Camprubi-Soms, PA-C  lamiVUDine (EPIVIR)  10 MG/ML solution Take 5 mLs (50 mg total) by mouth daily. Patient not taking: Reported on 10/13/2015 08/17/15   Cliffton AstersJohn Campbell, MD   Allergies  Allergen Reactions  . Contrast Media [Iodinated Diagnostic Agents] Hives and Itching  . Acetaminophen-Codeine Nausea And Vomiting  . Penicillins Hives    Has patient had a PCN reaction causing immediate rash, facial/tongue/throat swelling, SOB or lightheadedness with hypotension: Yes Has patient had a PCN reaction causing severe rash involving mucus membranes or skin necrosis: No Has patient had a PCN reaction that required hospitalization Yes Has patient had a PCN reaction occurring within the last 10 years: No If all of the above answers are "NO", then may proceed with Cephalosporin use.    FAMILY HISTORY:  family history includes Arthritis in his father; Diabetes in his mother. SOCIAL HISTORY:  reports that he has been smoking Cigarettes.  He has a 13.5 pack-year smoking history. He has never used smokeless tobacco. He reports that he drinks alcohol. He reports that he does not use illicit drugs.  REVIEW OF SYSTEMS:   Constitutional: Negative for fever and chills.  HENT: Negative for congestion and rhinorrhea.  Eyes: Negative for redness and visual disturbance.  Respiratory: Positive for shortness of breath but negative for wheezing.  Cardiovascular: Positive for chest pain but negative forpalpitations.  Gastrointestinal: Negative  for nausea , vomiting and abdominal pain and loose stools Genitourinary: Negative for dysuria and urgency.  Musculoskeletal: Negative for myalgias and arthralgias.  Skin: Negative for pallor and wound.  Neurological: Negative for dizziness and headaches  environmental allergies and polydipsia. Does not bruise/bleed easily.  SUBJECTIVE:   VITAL SIGNS: Temp:  [97.4 F (36.3 C)-98.2 F (36.8 C)] 98 F (36.7 C) (01/16 1321) Pulse Rate:  [85-110] 104 (01/16 1600) Resp:  [17-38] 26 (01/16 1600) BP:  (113-160)/(53-87) 113/53 mmHg (01/16 1600) SpO2:  [98 %-100 %] 99 % (01/16 1600) Weight:  [67.2 kg (148 lb 2.4 oz)] 67.2 kg (148 lb 2.4 oz) (01/16 1321)  PHYSICAL EXAMINATION: General: NAD Neuro:  Alert and oriented X4, no focal deficits HEENT: PERRLA Cardiovascular:  RRR, S1/S2 Lungs: Unlabored, right lower back crackles Abdomen: Soft, NT/ND, nl bowel sounds Musculoskeletal:  No joint deformities Skin: Warm and no rash   Recent Labs Lab 10/14/15 0414 10/14/15 1524 10/15/15 0545  NA 138 137 134*  K 5.7* 6.0* 6.4*  CL 96* 93* 97*  CO2 26 26 24   BUN 26* 36* 54*  CREATININE 8.65* 9.16* 10.25*  GLUCOSE 50* 64* 90    Recent Labs Lab 10/13/15 1013 10/13/15 1023 10/14/15 0414 10/15/15 0545  HGB 13.5 16.0 12.0* 11.8*  HCT 41.5 47.0 36.7* 35.4*  WBC 11.3*  --  11.7* 11.9*  PLT 101*  --  82* 101*   Ct Angio Chest Pe W/cm &/or Wo Cm  10/15/2015  CLINICAL DATA:  Chronic shortness of breath, worsening in the past few days. EXAM: CT ANGIOGRAPHY CHEST WITH CONTRAST TECHNIQUE: Multidetector CT imaging of the chest was performed using the standard protocol during bolus administration of intravenous contrast. Multiplanar CT image reconstructions and MIPs were obtained to evaluate the vascular anatomy. CONTRAST:  80mL OMNIPAQUE IOHEXOL 350 MG/ML SOLN COMPARISON:  None. FINDINGS: THORACIC INLET/BODY  WALL: Calcification in the right neck and chest wall which is likely from previous dialysis access. Symmetric gynecomastia MEDIASTINUM: Chronic cardiomegaly. Anterior pericardial calcification on the right without effusion. Calcification at the right brachiocephalic vein which is likely residual fibrin sheath. No evidence of pulmonary embolism. No acute aortic finding. Atherosclerosis. Mitral and aortic annular calcification, premature. There is also posterior leaflet calcification of the aortic valve. LUNG WINDOWS: Obstructive airway debris more extensive on the right, beginning at the bronchus  intermedius. Airway debris is also extensive throughout left lower lobe segmental airways. The pattern suggests aspiration. There is collapse of the right lower lobe and multi segment atelectasis in the left lower lobe and right middle lobe. Hypo enhancement in the right lower lobe surrounding clustered air spaces. Paraseptal emphysema at the apices UPPER ABDOMEN: Generous splenic volume, but partly visualized OSSEOUS: Sclerotic bones, likely from end-stage renal disease. Review of the MIP images confirms the above findings. IMPRESSION: 1. Cavitary pneumonia in the right lower lobe. 2. Extensive airway debris with lobar collapse, likely large volume aspiration. 3. Negative for pulmonary embolism. Electronically Signed   By: Marnee Spring M.D.   On: 10/15/2015 10:21   Nm Pulmonary Perf And Vent  10/14/2015  CLINICAL DATA:  Dyspnea, history end-stage renal disease on dialysis, hypertension, HIV, smoker EXAM: NUCLEAR MEDICINE VENTILATION - PERFUSION LUNG SCAN TECHNIQUE: Ventilation images were obtained in multiple projections using inhaled aerosol Tc-11m DTPA. Perfusion images were obtained in multiple projections after intravenous injection of Tc-65m MAA. Patient was unable to lie flat neck for imaging was performed upright on a single head camera. Ventilation imaging is severely limited by patient inability to tolerate exam and loss of mask axial with aerosol loss and facial contamination. RADIOPHARMACEUTICALS:  34.3 millicuries Technetium-49m DTPA aerosol inhalation and 4.34 millicuries Technetium-31m MAA IV COMPARISON:  None Radiographic correlation: Chest radiograph 10/13/2015, CT abdomen and pelvis 10/13/2015 FINDINGS: Ventilation: Technically non satisfactory limited exam due to central airway deposition of aerosol, facial contamination and inadequate aerosol delivery to the lung parenchyma. Matching diminished ventilation in RIGHT lower lobe at site of perfusion abnormality is suspected. Perfusion:  Segmental decreased perfusion RIGHT lower lobe posteriorly corresponding to consolidation on chest radiograph and abdominal CT. No other perfusion defects identified. Chest radiograph: RIGHT basilar airspace infiltrate question pneumonia. IMPRESSION: Technically nondiagnostic ventilation exam. Segmental decreased perfusion in RIGHT lower lobe corresponding to site of infiltrate on chest radiography and CT. Findings represent an intermediate probability for pulmonary embolism due to presence of matching perfusion and radiographic abnormalities in the setting of a nondiagnostic ventilation exam. Electronically Signed   By: Ulyses Southward M.D.   On: 10/14/2015 11:08    ASSESSMENT / PLAN: 50 yo male admitted with chest pain, sob and diarrhea; found to have cavitary pneumonia and large volume debris on chest CT. Given his smoking and immunocompromised status, differential diagnosis will include mycobacterial infection, aspiration pneumonia, or PCP-low probability due to normal CD4 count.  Plan Bronch for BAL cultures if CD-4 is low Quantiferon TB Gold assay Empiric antibiotics F/u cultures Repeat CD-4 count Sputum for fungi Will defer to ID for antimicrobial coverage Supplemental O2 prn for O2 sat<92% Negative pressure room and droplet/airborne precautions until ruled-out for TB   Magdalene S. Luci Bank, NP-C Pulmonary and Critical Care Medicine [facility] Pager: [phone]  10/15/2015, 4:30 PM  Attending Note:  50 year old male with HIV and CD4 counts that have been well over 200 presenting with SOB and found to have a RLL infiltrate that has  blossomed over the past 48 hours on CT. I reviewed chest CT myself, RLL infiltrate noted, concern for cavitary pneumonia, no PE. Discussed with PCCM-NP. RLL crackles and decreased BS on exam noted. PCCM consulted for possibility of a bronchoscopy.  RLL infiltrate: likely a bacterial pneumonia. I highly doubt it is TB or PCP given present CD-4  counts. - Repeat CD4 count, if low then will bronch. - Hold off bronch for now. - Monitor clinically. - Repeat CXR in 48 hours. - Fungal culture.  Hypoxemia: likely due to PNA. - Titrate O2 for sat of 88-92%.  Atelectasis: concern for that, patient is not breathing deeply. - IS per RT protocol. - F/U CXR in 48 hours.  PCCM will follow.  Patient seen and examined, agree with above note. I dictated the care and orders written for this patient under my direction.  Alyson Reedy, MD [phone]

## 2015-10-15 NOTE — Progress Notes (Signed)
Family Medicine Teaching Service Daily Progress Note Intern Pager: [phone]  Patient name: [name] Medical record number: [id number] Date of birth: 02/02/1966 Age: 50 y.o. Gender: male  Primary Care Provider: Donetta Potts, MD Consultants: Renal Code Status: Full  Pt Overview and Major Events to Date:  1/14: SOB Concerning for PNA vs PE. Elevated lactic acid and RUQ pain  Assessment and Plan: [name] is a 50 y.o. male presenting with shortness of breath, chest pain . PMH is significant for HIV on HAART, ESRD on HD (TTHSA), HSV, Smoking, Depression, peripheral neuropathy.  Cough/Dyspnea: improved. CXR with pneumonia. CTA with cavitary pneumonia in the right lower lobe and extensive airway debris with lobar collapse, likely large volume aspiration. Negative for PE. Unlikley to be PCP with CD4 of 920.  - D/c heparin gtt - Levaquin 1/14 and 1/15 - Continue vanc and cefepime pending ID recs after CTA 91/15> - Oxygen as needed  - continue home albuterol as needed - f/u Echo. BNP 190 - Schedule Duonebs  Abdominal pain/Diarrhea: likely cholelithiasis vs. msk.  Alk phos elevated but no transaminitis. CT abdomen with focal right lower lobe pneumonia and stone in gall bladder. GGT 283 (elevated) - Tylenol for pain-not allergic to plain tylenol, although allergic to Norco - Consider HIDA scan in no improvement in pain  HIV: followed by Dr. Michel Bickers. CD-4 920 in 06/2015. Viral load <20 at the same time. On Prezista, Norvir, Tenofovir and Epivir at home. Reports compliance.  - continue home meds - follow ID recs  ESRD: Patient unable to report cause. 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 On HD TThSa. -Nephrology following  -HD today for hyper K  Hyperkalemia: K 6.4 this morning. EKG NSR without T-wave or prolonged PR or wide QRS -Getting HD today  HSV: on valtrex 500 mg daily  -continue home valtrex  Thrombocytopenia: improved. Plt 81 on admission 101  this morning.  Depression: doesn't appear to be on any medication at home. - Will continue to follow  Tobacco abuse: reports smoking about 5 cigarettes a day. -Will give Nicotine patch 7 mg daily  FEN/GI:  Renal diet; SLIV Protonix  Prophylaxis: heparin SubQ  Disposition: Admit to med surg, attending Dr Nori Riis  Subjective:  Reports some improvement in pain and breathing today. RUQ pain worse with coughing and movement but not foods. Denies CP. Cough productive with streaks of blood Objective: Temp:  [97.4 F (36.3 C)-98.6 F (37 C)] 97.9 F (36.6 C) (01/16 0000) Pulse Rate:  [93-110] 100 (01/16 0200) Resp:  [29-40] 29 (01/16 0200) BP: (111-135)/(68-81) 125/74 mmHg (01/16 0200) SpO2:  [95 %-100 %] 100 % (01/16 0200) Physical Exam: Gen: sitting in chair eating breakfast CV: regular rate and rythm. S1 & S2 audible, 2/6 SEM Resp: no apparent work of breathing, clear to auscultation bilaterally. GI: bowel sounds normal, tender to palpation over RUQ GU: no suprapubic tenderness, right CVA tenderness Skin: no lesion  Laboratory:  Recent Labs Lab 10/13/15 1013 10/13/15 1023 10/14/15 0414 10/15/15 0545  WBC 11.3*  --  11.7* 11.9*  HGB 13.5 16.0 12.0* 11.8*  HCT 41.5 47.0 36.7* 35.4*  PLT 101*  --  82* 101*    Recent Labs Lab 10/13/15 1013  10/14/15 0414 10/14/15 1524 10/15/15 0545  NA  --   < > 138 137 134*  K  --   < > 5.7* 6.0* 6.4*  CL  --   < > 96* 93* 97*  CO2  --   --  _0 BUN  --   < > 26* 36* 54*  CREATININE  --   < > 8.65* 9.16* 10.25*  CALCIUM  --   --  9.1 9.5 9.8  PROT 7.6  --  7.3  --  7.8  BILITOT 1.5*  --  1.4*  --  1.7*  ALKPHOS 245*  --  155*  --  160*  ALT 38  --  25  --  21  AST 41  --  20  --  19  GLUCOSE  --   < > 50* 64* 90  < > = values in this interval not displayed. Ddimer: 3.67 Lactic Acid, Venous: 2.8 > 4.7 > 2.6 BNP    Component Value Date/Time   BNP 190.7* 10/13/2015 1013   BNP (last 3 results)  Recent Labs   10/13/15 1013  BNP 190.7*   Imaging/Diagnostic Tests: Ct Abdomen Pelvis Wo Contrast  10/13/2015  ADDENDUM REPORT: 10/13/2015 15:40 ADDENDUM: Patient has a 13 mm gallstone without CT evidence acute cholecystitis, which was noted in the body of the report, but should be included in the impression since it could potentially be the source of patient pain. Electronically Signed   By: Lajean Manes M.D.   On: 10/13/2015 15:40  10/13/2015  CLINICAL DATA:  Pt in severe pain and states he cannot lie flat or he can't breath. States he has pneumonia but now he is very SOB EXAM: CT ABDOMEN AND PELVIS WITHOUT CONTRAST TECHNIQUE: Multidetector CT imaging of the abdomen and pelvis was performed following the standard protocol without IV contrast. COMPARISON:  Current chest radiograph FINDINGS: The lung bases: Rounded area of consolidation noted in the right lower lobe consistent pneumonia. Bilateral lower lobe irregular peribronchial interstitial densities, also seen to a lesser degree in the right middle lobe. No convincing edema. No pleural effusion. Heart mildly enlarged. The Liver and spleen:  Unremarkable. Gallbladder and biliary tree: 13 mm stone in the lower gallbladder. No gallbladder wall thickening or adjacent inflammation. No bile duct dilation. Pancreas:  Unremarkable. Adrenal glands:  No masses. Kidneys, ureters, bladder: No renal cortical thinning although overall renal sizes are maintained. There are numerous bilateral renal cysts. Scattered calcifications are noted. There is no hydronephrosis. Ureters normal in course and in caliber. Bladder is decompressed. Lymph nodes:  No adenopathy. Ascites:  None. Gastrointestinal: No bowel wall thickening or inflammatory changes. No evidence obstruction. Musculoskeletal:  No osteoblastic or osteolytic lesions. IMPRESSION: 1. Focal right lower lobe pneumonia. Additional very bronchovascular interstitial opacities are noted both lower lobes and, to a lesser degree, in  the right middle lobe consistent with bronchitis. 2. No acute findings below the diaphragm. 3. 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 Electronically Signed: By: Lajean Manes M.D. On: 10/13/2015 13:32   Dg Chest Port 1 View  10/13/2015  CLINICAL DATA:  Short of breath today EXAM: PORTABLE CHEST 1 VIEW COMPARISON:  07/31/2015 FINDINGS: Hazy airspace disease has developed at the right lung base. Normal heart size. Left lung is clear. No pneumothorax. Left axillary vascular stent in place. IMPRESSION: Right basilar airspace disease worrisome for pneumonia. Followup PA and lateral chest X-ray is recommended in 3-4 weeks following trial of antibiotic therapy to ensure resolution and exclude underlying malignancy. Electronically Signed   By: Marybelle Killings M.D.   On: 10/13/2015 10:27   Mercy Riding, MD 10/15/2015, 8:11 AM PGY-3, [place] Intern pager: [phone], text pages welcome

## 2015-10-16 ENCOUNTER — Inpatient Hospital Stay (HOSPITAL_COMMUNITY): Payer: Medicare Other

## 2015-10-16 DIAGNOSIS — R0602 Shortness of breath: Secondary | ICD-10-CM

## 2015-10-16 DIAGNOSIS — R0600 Dyspnea, unspecified: Secondary | ICD-10-CM

## 2015-10-16 DIAGNOSIS — F17200 Nicotine dependence, unspecified, uncomplicated: Secondary | ICD-10-CM

## 2015-10-16 DIAGNOSIS — J9819 Other pulmonary collapse: Secondary | ICD-10-CM

## 2015-10-16 LAB — COMPREHENSIVE METABOLIC PANEL
ALBUMIN: 2.5 g/dL — AB (ref 3.5–5.0)
ALK PHOS: 156 U/L — AB (ref 38–126)
ALT: 20 U/L (ref 17–63)
ANION GAP: 15 (ref 5–15)
AST: 23 U/L (ref 15–41)
BILIRUBIN TOTAL: 0.8 mg/dL (ref 0.3–1.2)
BUN: 52 mg/dL — AB (ref 6–20)
CALCIUM: 10 mg/dL (ref 8.9–10.3)
CO2: 28 mmol/L (ref 22–32)
Chloride: 95 mmol/L — ABNORMAL LOW (ref 101–111)
Creatinine, Ser: 7.05 mg/dL — ABNORMAL HIGH (ref 0.61–1.24)
GFR calc Af Amer: 10 mL/min — ABNORMAL LOW (ref >60)
GFR calc non Af Amer: 8 mL/min — ABNORMAL LOW (ref >60)
GLUCOSE: 104 mg/dL — AB (ref 65–99)
Potassium: 3.8 mmol/L (ref 3.5–5.1)
Sodium: 138 mmol/L (ref 135–145)
TOTAL PROTEIN: 7.7 g/dL (ref 6.5–8.1)

## 2015-10-16 LAB — CBC
HCT: 36.4 % — ABNORMAL LOW (ref 39.0–52.0)
Hemoglobin: 12.3 g/dL — ABNORMAL LOW (ref 13.0–17.0)
MCH: 32 pg (ref 26.0–34.0)
MCHC: 33.8 g/dL (ref 30.0–36.0)
MCV: 94.8 fL (ref 78.0–100.0)
Platelets: 128 10*3/uL — ABNORMAL LOW (ref 150–400)
RBC: 3.84 MIL/uL — ABNORMAL LOW (ref 4.22–5.81)
RDW: 14.8 % (ref 11.5–15.5)
WBC: 14.9 10*3/uL — ABNORMAL HIGH (ref 4.0–10.5)

## 2015-10-16 LAB — T-HELPER CELLS (CD4) COUNT (NOT AT ARMC)
CD4 % Helper T Cell: 24 % — ABNORMAL LOW (ref 33–55)
CD4 T Cell Abs: 380 /uL — ABNORMAL LOW (ref 400–2700)

## 2015-10-16 MED ORDER — DEXTROSE 5 % IV SOLN
2.0000 g | Freq: Once | INTRAVENOUS | Status: AC
  Administered 2015-10-16: 2 g via INTRAVENOUS
  Filled 2015-10-16: qty 2

## 2015-10-16 MED ORDER — DEXTROSE 5 % IV SOLN: 2.0000 g | INTRAVENOUS | Status: DC

## 2015-10-16 NOTE — Consult Note (Signed)
Name: [name] MRN: [id number] DOB: Feb 10, 1966    ADMISSION DATE:  10/13/2015 CONSULTATION DATE:  1/16  REFERRING MD :  Lenise Herald  CHIEF COMPLAINT:  SOB  BRIEF PATIENT DESCRIPTION: 50 year old male with history of HIV on HAART and ESRD admitted 1/14  treated for CAP > broadened to Vanc/Zosyn per ID recs. PE considered part of differential. CTA 1/16 neg for PE, however, RLL cavitary PNA identified. PCCM consulted for further eval.   SIGNIFICANT EVENTS  01/14: Chest pain and SOB>ED>admitted with CAP 01/16: CTA chest>Cavitary pneumonia in the right lower lobe and extensive airway debris/large volume aspiration  STUDIES:  2- D echo 01/15>>  HISTORY OF PRESENT ILLNESS: This is a 50 yo male with a h/o HIV on HAART (last CD4 920 and undetectable viral load), ESRD on HD and hypertension who presented to the ED with a cough that is productive of yellow sputum x 2 weeks, hemoptysis, diarrhea, low grade fever and chest pain. He described chest pain as radiating to his back and also reported RUQ/RLQ abdominal pain. He denied any recent foreign travel, and sick contacts.  His initial CXR showed right basilar infiltrate.There was also concern for a PE hence a CTA chest and VQ scan were both ordered and he was started on heparin. His VQ scan indiated an intermediate probability for pulmonary embolism but his CTA chest was negative.  He was started on cefepime and vanco and seen by ID. ID recommended changing to vanco and zosyn. His CTA chest today shows cavitary pneumonia in the right lower lobe,  large volume debris and lobar collapse. PCCM was consulted to assist with further diagnostic work-up and management.  He is a current smoker and smokes 1/2 pk/day.   PAST MEDICAL HISTORY :   has a past medical history of HIV infection (HCC); DJD (degenerative joint disease); Tertiary syphilis; Genital herpes; Anal condyloma; Family history of disseminated HSV infection; Shortness of breath; ESRD  (end stage renal disease) on dialysis (HCC) (01/02/2012); Thrombosis of dialysis vascular access (HCC) (01/02/2012); Hypertension; Pneumonia; Diarrhea; and Hemodialysis patient (HCC).  has past surgical history that includes AV fistula, brachiocephalic (Right, 40/98/11); Appendectomy; Tonsillectomy; Cholecystectomy; Incision and drainage abscess; AV fistula placement (Right, 06/16/11); and Revison of arteriovenous fistula (Right, 04/20/2013). Prior to Admission medications   Medication Sig Start Date End Date Taking? Authorizing Provider  acetaminophen (TYLENOL) 650 MG CR tablet Take 1,300 mg by mouth every 6 (six) hours as needed for pain.   Yes Historical Provider, MD  albuterol (PROVENTIL HFA;VENTOLIN HFA) 108 (90 BASE) MCG/ACT inhaler Inhale 1-2 puffs into the lungs every 6 (six) hours as needed for wheezing or shortness of breath. 09/13/13  Yes Purvis Sheffield, MD  calcium acetate (PHOSLO) 667 MG capsule Take 2,668 mg by mouth See admin instructions. Take 4 capsules (2668 mg) by mouth with meals - once or twice daily   Yes Historical Provider, MD  Darunavir Ethanolate (PREZISTA) 800 MG tablet Take 1 tablet (800 mg total) by mouth at bedtime. Take with Norvir tablet. 08/17/15  Yes Cliffton [name], MD  ritonavir (NORVIR) 100 MG TABS tablet Take 1 tablet (100 mg total) by mouth at bedtime. Take with Prezista tablet. 08/17/15  Yes Cliffton [name], MD  tenofovir (VIREAD) 300 MG tablet Take 1 tablet (300 mg total) by mouth once a week. On Sundays Patient taking differently: Take 300 mg by mouth every Sunday. On Sundays 08/17/15  Yes Cliffton [name], MD  valACYclovir (VALTREX) 500 MG tablet Take 1 tablet (500 mg total)  by mouth daily as needed (herpes outbreaks). 06/05/15  Yes Cliffton [name], MD  HYDROcodone-acetaminophen (NORCO) 5-325 MG per tablet Take 1 tablet by mouth every 6 (six) hours as needed for severe pain. Patient not taking: Reported on 08/04/2015 02/28/15   Mercedes Camprubi-Soms, PA-C  lamiVUDine (EPIVIR)  10 MG/ML solution Take 5 mLs (50 mg total) by mouth daily. Patient not taking: Reported on 10/13/2015 08/17/15   Cliffton [name], MD   Allergies  Allergen Reactions  . Contrast Media [Iodinated Diagnostic Agents] Hives and Itching  . Acetaminophen-Codeine Nausea And Vomiting  . Penicillins Hives    Has patient had a PCN reaction causing immediate rash, facial/tongue/throat swelling, SOB or lightheadedness with hypotension: Yes Has patient had a PCN reaction causing severe rash involving mucus membranes or skin necrosis: No Has patient had a PCN reaction that required hospitalization Yes Has patient had a PCN reaction occurring within the last 10 years: No If all of the above answers are "NO", then may proceed with Cephalosporin use.    FAMILY HISTORY:  family history includes Arthritis in his father; Diabetes in his mother. SOCIAL HISTORY:  reports that he has been smoking Cigarettes.  He has a 13.5 pack-year smoking history. He has never used smokeless tobacco. He reports that he drinks alcohol. He reports that he does not use illicit drugs.  REVIEW OF SYSTEMS:   Constitutional: Negative for fever and chills.  HENT: Negative for congestion and rhinorrhea.  Eyes: Negative for redness and visual disturbance.  Respiratory: Denies shortness of breath  Cardiovascular: Denies chest pain.  Gastrointestinal: Negative  for nausea, vomiting and abdominal pain and loose stools Genitourinary: Negative for dysuria and urgency.  Musculoskeletal: Negative for myalgias and arthralgias.  Skin: Negative for pallor and wound.  Neurological: Negative for dizziness and headaches, environmental allergies and polydipsia. SUBJECTIVE:   VITAL SIGNS: Temp:  [97.5 F (36.4 C)-98.3 F (36.8 C)] 98.3 F (36.8 C) (01/17 1135) Pulse Rate:  [79-112] 79 (01/17 1135) Resp:  [15-35] 15 (01/17 1135) BP: (101-133)/(43-81) 111/57 mmHg (01/17 1135) SpO2:  [91 %-100 %] 94 % (01/17 1135) Weight:  [65.2 kg (143 lb  11.8 oz)] 65.2 kg (143 lb 11.8 oz) (01/16 1706)  PHYSICAL EXAMINATION: General: Well appearing male, no acute distress Neuro:  Alert and oriented X4, no focal deficits HEENT: PERRLA Cardiovascular:  RRR, S1/S2 Lungs: Unlabored, clear diminished breath sounds, no use of accessory muscles   Abdomen: Soft, NT/ND, nl bowel sounds Musculoskeletal:  No joint deformities Skin: Warm and no rash   Recent Labs Lab 10/14/15 1524 10/15/15 0545 10/16/15 1008  NA 137 134* 138  K 6.0* 6.4* 3.8  CL 93* 97* 95*  CO2 BUN 36* 54* 52*  CREATININE 9.16* 10.25* 7.05*  GLUCOSE 64* 90 104*    Recent Labs Lab 10/14/15 0414 10/15/15 0545 10/16/15 1008  HGB 12.0* 11.8* 12.3*  HCT 36.7* 35.4* 36.4*  WBC 11.7* 11.9* 14.9*  PLT 82* 101* 128*   Ct Angio Chest Pe W/cm &/or Wo Cm  10/15/2015  CLINICAL DATA:  Chronic shortness of breath, worsening in the past few days. EXAM: CT ANGIOGRAPHY CHEST WITH CONTRAST TECHNIQUE: Multidetector CT imaging of the chest was performed using the standard protocol during bolus administration of intravenous contrast. Multiplanar CT image reconstructions and MIPs were obtained to evaluate the vascular anatomy. CONTRAST:  80mL OMNIPAQUE IOHEXOL 350 MG/ML SOLN COMPARISON:  None. FINDINGS: THORACIC INLET/BODY WALL: Calcification in the right neck and chest wall which is likely from previous  dialysis access. Symmetric gynecomastia MEDIASTINUM: Chronic cardiomegaly. Anterior pericardial calcification on the right without effusion. Calcification at the right brachiocephalic vein which is likely residual fibrin sheath. No evidence of pulmonary embolism. No acute aortic finding. Atherosclerosis. Mitral and aortic annular calcification, premature. There is also posterior leaflet calcification of the aortic valve. LUNG WINDOWS: Obstructive airway debris more extensive on the right, beginning at the bronchus intermedius. Airway debris is also extensive throughout left lower lobe  segmental airways. The pattern suggests aspiration. There is collapse of the right lower lobe and multi segment atelectasis in the left lower lobe and right middle lobe. Hypo enhancement in the right lower lobe surrounding clustered air spaces. Paraseptal emphysema at the apices UPPER ABDOMEN: Generous splenic volume, but partly visualized OSSEOUS: Sclerotic bones, likely from end-stage renal disease. Review of the MIP images confirms the above findings. IMPRESSION: 1. Cavitary pneumonia in the right lower lobe. 2. Extensive airway debris with lobar collapse, likely large volume aspiration. 3. Negative for pulmonary embolism. Electronically Signed   By: Marnee Spring M.D.   On: 10/15/2015 10:21    ASSESSMENT / PLAN: 50 yo male admitted with chest pain, sob and diarrhea; found to have cavitary pneumonia and large volume debris on chest CT. Given his smoking and immunocompromised status, differential diagnosis will include mycobacterial infection, aspiration pneumonia, or PCP-low probability due to normal CD4 count.  Plan Bronch not indicated. Quantiferon TB Gold assay. Empiric antibiotics. F/u cultures. Sputum for fungi Will defer to ID for antimicrobial coverage Supplemental O2 prn for O2 sat<92%  Attending Note:  50 year old male with HIV and CD4 counts that have been well over 200 presenting with SOB and found to have a RLL infiltrate that has blossomed over the past 48 hours on CT. I reviewed chest CT myself, RLL infiltrate noted, concern for cavitary pneumonia, no PE. Discussed with PCCM-NP. RLL crackles and decreased BS on exam noted. PCCM consulted for possibility of a bronchoscopy.  RLL infiltrate: likely a bacterial pneumonia. I highly doubt it is TB or PCP given present CD-4 counts. - Repeat CD4 380, unlikely to be TB or PCP. - No indication for bronch at this time. - Monitor clinically. - Fungal culture pending.  Hypoxemia:  likely due to PNA. - Titrate O2 for sat of 88-92%.  Atelectasis: concern for that, patient is not breathing deeply. - IS per RT protocol. - F/U CXR in 48 hours.  PCCM will sign off, please call back if needed.  Patient seen and examined, agree with above note. I dictated the care and orders written for this patient under my direction.  Alyson Reedy, MD [phone]

## 2015-10-16 NOTE — Progress Notes (Signed)
Assessment: 1 RLL PNA -  2 ESRD- normally on HD tts via AVG- off schedule due to high K tx yesterday; reeval for next Tx in AM 3 HIV on HAART  Dialysis: [facility] 3h 67kg 2/3.5Ca bath P4 Heparin none LUA AVG  Subjective: Interval History: Breathing better and is below EDW now  Objective: Vital signs in last 24 hours: Temp:  [97.5 F (36.4 C)-98.3 F (36.8 C)] 98.2 F (36.8 C) (01/17 0748) Pulse Rate:  [85-112] 89 (01/17 0748) Resp:  [16-35] 16 (01/17 0748) BP: (101-160)/(43-81) 125/57 mmHg (01/17 0748) SpO2:  [91 %-100 %] 91 % (01/17 0748) Weight:  [65.2 kg (143 lb 11.8 oz)-67.2 kg (148 lb 2.4 oz)] 65.2 kg (143 lb 11.8 oz) (01/16 1706) Weight change:   Intake/Output from previous day: 01/16 0701 - 01/17 0700 In: 360.6 [P.O.:240; I.V.:120.6] Out: 2002 [Urine:1; Stool:1] Intake/Output this shift:    General appearance: alert and cooperative Back: negative, symmetric, no curvature. ROM normal. No CVA tenderness. Resp: clear to auscultation bilaterally Cardio: regular rate and rhythm, S1, S2 normal, no murmur, click, rub or gallop Extremities: extremities normal, atraumatic, no cyanosis or edema  LUE AVA  Lab Results:  Recent Labs  10/14/15 0414 10/15/15 0545  WBC 11.7* 11.9*  HGB 12.0* 11.8*  HCT 36.7* 35.4*  PLT 82* 101*   BMET:  Recent Labs  10/14/15 1524 10/15/15 0545  NA 137 134*  K 6.0* 6.4*  CL 93* 97*  CO2 26 24  GLUCOSE 64* 90  BUN 36* 54*  CREATININE 9.16* 10.25*  CALCIUM 9.5 9.8   No results for input(s): PTH in the last 72 hours. Iron Studies: No results for input(s): IRON, TIBC, TRANSFERRIN, FERRITIN in the last 72 hours. Studies/Results: Ct Angio Chest Pe W/cm &/or Wo Cm  10/15/2015  CLINICAL DATA:  Chronic shortness of breath, worsening in the past few days. EXAM: CT ANGIOGRAPHY CHEST WITH CONTRAST TECHNIQUE: Multidetector CT imaging of the chest was performed using the standard protocol during bolus administration of  intravenous contrast. Multiplanar CT image reconstructions and MIPs were obtained to evaluate the vascular anatomy. CONTRAST:  80mL OMNIPAQUE IOHEXOL 350 MG/ML SOLN COMPARISON:  None. FINDINGS: THORACIC INLET/BODY WALL: Calcification in the right neck and chest wall which is likely from previous dialysis access. Symmetric gynecomastia MEDIASTINUM: Chronic cardiomegaly. Anterior pericardial calcification on the right without effusion. Calcification at the right brachiocephalic vein which is likely residual fibrin sheath. No evidence of pulmonary embolism. No acute aortic finding. Atherosclerosis. Mitral and aortic annular calcification, premature. There is also posterior leaflet calcification of the aortic valve. LUNG WINDOWS: Obstructive airway debris more extensive on the right, beginning at the bronchus intermedius. Airway debris is also extensive throughout left lower lobe segmental airways. The pattern suggests aspiration. There is collapse of the right lower lobe and multi segment atelectasis in the left lower lobe and right middle lobe. Hypo enhancement in the right lower lobe surrounding clustered air spaces. Paraseptal emphysema at the apices UPPER ABDOMEN: Generous splenic volume, but partly visualized OSSEOUS: Sclerotic bones, likely from end-stage renal disease. Review of the MIP images confirms the above findings. IMPRESSION: 1. Cavitary pneumonia in the right lower lobe. 2. Extensive airway debris with lobar collapse, likely large volume aspiration. 3. Negative for pulmonary embolism. Electronically Signed   By: Marnee Spring M.D.   On: 10/15/2015 10:21   Nm Pulmonary Perf And Vent  10/14/2015  CLINICAL DATA:  Dyspnea, history end-stage renal disease on dialysis, hypertension, HIV, smoker EXAM: NUCLEAR MEDICINE  VENTILATION - PERFUSION LUNG SCAN TECHNIQUE: Ventilation images were obtained in multiple projections using inhaled aerosol Tc-56m DTPA. Perfusion images were obtained in multiple  projections after intravenous injection of Tc-62m MAA. Patient was unable to lie flat neck for imaging was performed upright on a single head camera. Ventilation imaging is severely limited by patient inability to tolerate exam and loss of mask axial with aerosol loss and facial contamination. RADIOPHARMACEUTICALS:  34.3 millicuries Technetium-41m DTPA aerosol inhalation and 4.34 millicuries Technetium-74m MAA IV COMPARISON:  None Radiographic correlation: Chest radiograph 10/13/2015, CT abdomen and pelvis 10/13/2015 FINDINGS: Ventilation: Technically non satisfactory limited exam due to central airway deposition of aerosol, facial contamination and inadequate aerosol delivery to the lung parenchyma. Matching diminished ventilation in RIGHT lower lobe at site of perfusion abnormality is suspected. Perfusion: Segmental decreased perfusion RIGHT lower lobe posteriorly corresponding to consolidation on chest radiograph and abdominal CT. No other perfusion defects identified. Chest radiograph: RIGHT basilar airspace infiltrate question pneumonia. IMPRESSION: Technically nondiagnostic ventilation exam. Segmental decreased perfusion in RIGHT lower lobe corresponding to site of infiltrate on chest radiography and CT. Findings represent an intermediate probability for pulmonary embolism due to presence of matching perfusion and radiographic abnormalities in the setting of a nondiagnostic ventilation exam. Electronically Signed   By: Ulyses Southward M.D.   On: 10/14/2015 11:08   Scheduled: . antiseptic oral rinse  7 mL Mouth Rinse BID  . calcium acetate  2,668 mg Oral TID WC  . [START ON 10/18/2015] ceFEPime (MAXIPIME) IV  2 g Intravenous Q T,Th,Sa-HD  . Darunavir Ethanolate  800 mg Oral QHS  . doxercalciferol  1 mcg Intravenous Q T,Th,Sa-HD  . feeding supplement (NEPRO CARB STEADY)  237 mL Oral BID BM  . ferric gluconate (FERRLECIT/NULECIT) IV  125 mg Intravenous Q T,Th,Sa-HD  . nicotine  7 mg Transdermal Daily  .  ritonavir  100 mg Oral QHS  . sodium polystyrene  30 g Oral Once  . tenofovir  300 mg Oral Q Sun     LOS: 3 days   Meryl Ponder C 10/16/2015,10:19 AM

## 2015-10-16 NOTE — Progress Notes (Signed)
Family Medicine Teaching Service Daily Progress Note Intern Pager: [phone]  Patient name: [name] Medical record number: [id number] Date of birth: September 26, 1966 Age: 50 y.o. Gender: male  Primary Care Provider: Donetta Potts, MD Consultants: Renal Code Status: Full  Pt Overview and Major Events to Date:  1/14: SOB Concerning for PNA vs PE. Elevated lactic acid and RUQ pain  Assessment and Plan: [name] is a 50 y.o. male presenting with shortness of breath, chest pain . PMH is significant for HIV on HAART, ESRD on HD (TTHSA), HSV, Smoking, Depression, peripheral neuropathy.  Cough/Dyspnea likely secondary to pneumonia: improved. Lung exam crackles and diminished air sounds on the right. Also diminished air sounds on the left over lower lung fields likely from poor respiratory effort.with CXR right basilar airspace disease worrisome for pneumonia. CTA negative for PE but with cavitary pneumonia in the right lower lobe and extensive airway debris with lobar collapse, likely large volume. Flu negative. Patient has been sating in upper 90's to 100. Unlikley to be PCP with CD4 of 920.  - Appreciate ID recs - Appreciate CCM recs:  - Repeat CD4 count, if low then will bronch. - Hold off bronch for now. - Repeat CXR in 48 hours. - Fungal culture.  -Quantiferon gold. No TB precuation as this is less likely - D/cd heparin gtt 1/16 - Levaquin 1/14 and 1/15 - Continue vanc and cefepime 1/16> - Incentive spirometry - Oxygen as needed  - continue home albuterol as needed - f/u Echo. BNP 190 - Schedule Duonebs  Abdominal pain/Diarrhea: likely cholelithiasis vs. msk.  Alk phos elevated but no transaminitis. CT abdomen with focal right lower lobe pneumonia and stone in gall bladder. GGT 283 (elevated) - Tylenol for pain-not allergic to plain tylenol, although allergic to Norco - Consider HIDA scan if no improvement in pain - Consider  Chenodiol for gall stone dissolution  HIV: followed by Dr. Michel Bickers. CD-4 920 in 06/2015. Viral load <20 at the same time. On Prezista, Norvir, Tenofovir and Epivir at home. Reports compliance.  - continue home meds - follow ID recs  H/o of HSV: no lesion -Continued home Valcyclovir  ESRD: Patient unable to report cause. 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 On HD TThSa. Received HD on 1/16 for hyperkalemia -Nephrology following  -HD TThSa. He got HD yesterday. Next on Thursday  Hyperkalemia: K 6.4 this morning. EKG NSR without T-wave or prolonged PR or wide QRS -Received HD on 01/16 -f/u CMP  HSV: on valtrex 500 mg daily  -continue home valtrex  Thrombocytopenia: improved from 81 on admission to 101 -f/u CBC   Depression: doesn't appear to be on any medication at home. - Will continue to follow  Tobacco abuse: reports smoking about 5 cigarettes a day. -Will give Nicotine patch 7 mg daily  FEN/GI:  Renal diet; SLIV Protonix  Prophylaxis: heparin SubQ  Disposition: transfer to floor today  Subjective:  No pain or shortness of breath. Cough better still with yellow sputum. No blood streaking Objective: Temp:  [97.5 F (36.4 C)-98.3 F (36.8 C)] 97.5 F (36.4 C) (01/17 0348) Pulse Rate:  [85-112] 94 (01/17 0001) Resp:  [17-35] 21 (01/17 0348) BP: (101-160)/(43-87) 126/71 mmHg (01/17 0348) SpO2:  [95 %-100 %] 95 % (01/17 0348) Weight:  [143 lb 11.8 oz (65.2 kg)-148 lb 2.4 oz (67.2 kg)] 143 lb 11.8 oz (65.2 kg) (01/16 1706) Physical Exam: Gen: sitting in chair eating breakfast CV: regular rate and rythm. S1 & S2 audible,  2/6 SEM Resp: no apparent work of breathing, crackles and diminished air sounds on the right. Also diminished air sounds on the left over lower lung fields likely from poor respiratory effort. GI: bowel sounds normal, no tenderness to palpation GU: no suprapubic tenderness, right CVA tenderness Skin: no lesion  Laboratory:  Recent Labs Lab  10/13/15 1013 10/13/15 1023 10/14/15 0414 10/15/15 0545  WBC 11.3*  --  11.7* 11.9*  HGB 13.5 16.0 12.0* 11.8*  HCT 41.5 47.0 36.7* 35.4*  PLT 101*  --  82* 101*    Recent Labs Lab 10/13/15 1013  10/14/15 0414 10/14/15 1524 10/15/15 0545  NA  --   < > 138 137 134*  K  --   < > 5.7* 6.0* 6.4*  CL  --   < > 96* 93* 97*  CO2  --   --  _0 BUN  --   < > 26* 36* 54*  CREATININE  --   < > 8.65* 9.16* 10.25*  CALCIUM  --   --  9.1 9.5 9.8  PROT 7.6  --  7.3  --  7.8  BILITOT 1.5*  --  1.4*  --  1.7*  ALKPHOS 245*  --  155*  --  160*  ALT 38  --  25  --  21  AST 41  --  20  --  19  GLUCOSE  --   < > 50* 64* 90  < > = values in this interval not displayed. Ddimer: 3.67 Lactic Acid, Venous: 2.8 > 4.7 > 2.6 BNP    Component Value Date/Time   BNP 190.7* 10/13/2015 1013   BNP (last 3 results)  Recent Labs  10/13/15 1013  BNP 190.7*   Imaging/Diagnostic Tests: Ct Abdomen Pelvis Wo Contrast  10/13/2015  ADDENDUM REPORT: 10/13/2015 15:40 ADDENDUM: Patient has a 13 mm gallstone without CT evidence acute cholecystitis, which was noted in the body of the report, but should be included in the impression since it could potentially be the source of patient pain. Electronically Signed   By: Lajean Manes M.D.   On: 10/13/2015 15:40  10/13/2015  1. Focal right lower lobe pneumonia. Additional very bronchovascular interstitial opacities are noted both lower lobes and, to a lesser degree, in the right middle lobe consistent with bronchitis. 2. No acute findings below the diaphragm. 3. 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 Electronically Signed: By: Lajean Manes M.D. On: 10/13/2015 13:32   Dg Chest Port 1 View  10/13/2015  Right basilar airspace disease worrisome for pneumonia. Followup PA and lateral chest X-ray is recommended in 3-4 weeks following trial of antibiotic therapy to ensure resolution and exclude underlying  malignancy. Electronically Signed   By: Marybelle Killings M.D.   On: 10/13/2015 10:27   Ct Angio Chest Pe W/cm &/or Wo Cm  10/15/2015   Cavitary pneumonia in the right lower lobe. 2. Extensive airway debris with lobar collapse, likely large volume aspiration. 3. Negative for pulmonary embolism. Electronically Signed   By: Monte Fantasia M.D.   On: 10/15/2015 10:21    Mercy Riding, MD 10/16/2015, 6:36 AM PGY-1, [place] Intern pager: [phone], text pages welcome

## 2015-10-16 NOTE — Care Management Important Message (Signed)
Important Message  Patient Details  Name: [name] MRN: [id number] Date of Birth: 10-03-1965   Medicare Important Message Given:  Yes    [name] 10/16/2015, 11:52 AM

## 2015-10-16 NOTE — Progress Notes (Addendum)
[facility]   Reason for visit: Follow up on pneumonia  Interval History: he has improved, off of O2, feels much better compared to admission.  No fever, some cough and productive sputum.  Not up much due to tubes, connections.  Tells me he does not take lamivudine at home.  Does not report any aspiration symptoms.    CT chest independently reviewed, large area of RLL, RML.   Physical Exam: Constitutional:  Filed Vitals:   10/16/15 0001 10/16/15 0348  BP: 118/64 126/71  Pulse: 94   Temp: 98.2 F (36.8 C) 97.5 F (36.4 C)  Resp: 23 21   patient appears in NAD Eyes: anicteric HENT: no thrush Respiratory: Normal respiratory effort; CTA B Cardiovascular: RRR GI: soft, nt, nd Skin: no rashes MS: no edema  Review of Systems: Constitutional: negative for fevers, chills and malaise Gastrointestinal: negative for nausea, vomiting and diarrhea  Lab Results  Component Value Date   WBC 11.9* 10/15/2015   HGB 11.8* 10/15/2015   HCT 35.4* 10/15/2015   MCV 94.4 10/15/2015   PLT 101* 10/15/2015    Lab Results  Component Value Date   CREATININE 10.25* 10/15/2015   BUN 54* 10/15/2015   NA 134* 10/15/2015   K 6.4* 10/15/2015   CL 97* 10/15/2015   CO2 24 10/15/2015    Lab Results  Component Value Date   ALT 21 10/15/2015   AST 19 10/15/2015   ALKPHOS 160* 10/15/2015     Microbiology: Recent Results (from the past 240 hour(s))  Blood culture (routine x 2)     Status: None (Preliminary result)   Collection Time: 10/13/15 11:08 AM  Result Value Ref Range Status   Specimen Description BLOOD LEFT ANTECUBITAL  Final   Special Requests BOTTLES DRAWN AEROBIC ONLY 10CC  Final   Culture NO GROWTH 2 DAYS  Final   Report Status PENDING  Incomplete  Blood culture (routine x 2)     Status: None (Preliminary result)   Collection Time: 10/13/15 11:16 AM  Result Value Ref Range Status   Specimen Description BLOOD LEFT HAND  Final   Special Requests  BOTTLES DRAWN AEROBIC AND ANAEROBIC 10CC  Final   Culture NO GROWTH 2 DAYS  Final   Report Status PENDING  Incomplete  MRSA PCR Screening     Status: None   Collection Time: 10/14/15  5:59 AM  Result Value Ref Range Status   MRSA by PCR NEGATIVE NEGATIVE Final    Comment:        The GeneXpert MRSA Assay (FDA approved for NASAL specimens only), is one component of a comprehensive MRSA colonization surveillance program. It is not intended to diagnose MRSA infection nor to guide or monitor treatment for MRSA infections.   Culture, expectorated sputum-assessment     Status: None   Collection Time: 10/15/15 12:28 AM  Result Value Ref Range Status   Specimen Description SPUTUM  Final   Special Requests NONE  Final   Sputum evaluation THIS SPECIMEN IS ACCEPTABLE FOR SPUTUM CULTURE  Final   Report Status 10/15/2015 FINAL  Final    Impression/Plan:  1. Pneumonia - RLL, seems to be responding to cefepime.  Can continue with this and change to ceftazidime with dialysis at discharge.  Would continue for 7 more days. 2.  RML and RLL collapse. - ? 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 Though CXR in November without extensive disease.  No history of aspiration symptoms.  Thought by pulmonary just to be bacterial pneumonia, no chronic process.  I do not think it is HIV/CD4 - related, possibly smoking related.   3. HIV - on Prezista/n and tenofovir by his report. Does not take lamivudine.  Undetectable vl though. CD4 920 in October.   4. Disposition - requesting PCP at discharge, family medicine team?

## 2015-10-16 NOTE — Progress Notes (Signed)
  Echocardiogram 2D Echocardiogram has been performed.  [name] 10/16/2015, 9:42 AM

## 2015-10-16 NOTE — Progress Notes (Signed)
Pharmacy Antibiotic Follow-up Note  Assessment/Plan: [name] is a 50 y.o.male admitted on 10/13/2015.  The patient is currently on day #3 of abx for HAP. First started on Levaquin for CAP and now considered HAP. 1/14 CT abdomen shows RLL c/w PNA. ID now following. Also on PTA HIV meds. darunavir, ritonavir, tenofovir. Afebrile, WBC slightly elevated at 11.9.  Plan: Give cefepime 2g IV x 1 this morning s/p HD yesterday Continue cefepime 2g IV QHD-TTS to restart after next HD on Thursday Monitor clinical picture, HD sessions F/U C&S, abx deescalation / LOT   Temp (24hrs), Avg:98 F (36.7 C), Min:97.5 F (36.4 C), Max:98.3 F (36.8 C)   Recent Labs Lab 10/13/15 1013 10/14/15 0414 10/15/15 0545  WBC 11.3* 11.7* 11.9*    Recent Labs Lab 10/13/15 1023 10/14/15 0414 10/14/15 1524 10/15/15 0545  CREATININE 10.90* 8.65* 9.16* 10.25*   Estimated Creatinine Clearance: 8.2 mL/min (by C-G formula based on Cr of 10.25).    Allergies  Allergen Reactions  . Contrast Media [Iodinated Diagnostic Agents] Hives and Itching  . Acetaminophen-Codeine Nausea And Vomiting  . Penicillins Hives    Has patient had a PCN reaction causing immediate rash, facial/tongue/throat swelling, SOB or lightheadedness with hypotension: Yes Has patient had a PCN reaction causing severe rash involving mucus membranes or skin necrosis: No Has patient had a PCN reaction that required hospitalization Yes Has patient had a PCN reaction occurring within the last 10 years: No If all of the above answers are "NO", then may proceed with Cephalosporin use.    Antimicrobials this admission: Levaquin 1/14 >> 1/15 Cefepime 1/15 >> Vancomycin 1/15 >> 1/16   Microbiology results: 1/15 MRSA PCR negative 1/15 flu 1/14 BC x2 > ngtd 1/15 Resp cx > pending  Thank you for allowing pharmacy to be a part of this patient's care.  Enzo Bi, PharmD, BCPS Clinical Pharmacist Pager [phone] 10/16/2015  7:15 AM

## 2015-10-17 LAB — QUANTIFERON IN TUBE
QFT TB AG MINUS NIL VALUE: 0 IU/mL
QUANTIFERON MITOGEN VALUE: 0.04 IU/mL
QUANTIFERON TB AG VALUE: 0.04 [IU]/mL
QUANTIFERON TB GOLD: UNDETERMINED
Quantiferon Nil Value: 0.04 IU/mL

## 2015-10-17 LAB — BASIC METABOLIC PANEL
ANION GAP: 15 (ref 5–15)
BUN: 70 mg/dL — ABNORMAL HIGH (ref 6–20)
CHLORIDE: 95 mmol/L — AB (ref 101–111)
CO2: 26 mmol/L (ref 22–32)
Calcium: 10.1 mg/dL (ref 8.9–10.3)
Creatinine, Ser: 8.53 mg/dL — ABNORMAL HIGH (ref 0.61–1.24)
GFR calc non Af Amer: 7 mL/min — ABNORMAL LOW (ref >60)
GFR, EST AFRICAN AMERICAN: 8 mL/min — AB (ref >60)
Glucose, Bld: 81 mg/dL (ref 65–99)
POTASSIUM: 4 mmol/L (ref 3.5–5.1)
SODIUM: 136 mmol/L (ref 135–145)

## 2015-10-17 LAB — CBC
HCT: 37 % — ABNORMAL LOW (ref 39.0–52.0)
HEMOGLOBIN: 12.3 g/dL — AB (ref 13.0–17.0)
MCH: 31.6 pg (ref 26.0–34.0)
MCHC: 33.2 g/dL (ref 30.0–36.0)
MCV: 95.1 fL (ref 78.0–100.0)
PLATELETS: 156 10*3/uL (ref 150–400)
RBC: 3.89 MIL/uL — AB (ref 4.22–5.81)
RDW: 14.9 % (ref 11.5–15.5)
WBC: 12.3 10*3/uL — AB (ref 4.0–10.5)

## 2015-10-17 LAB — QUANTIFERON TB GOLD ASSAY (BLOOD)

## 2015-10-17 LAB — PROCALCITONIN: Procalcitonin: >175 ng/mL

## 2015-10-17 MED ORDER — RENA-VITE PO TABS: 1.0000 | ORAL_TABLET | Freq: Every day | ORAL | Status: DC

## 2015-10-17 MED ORDER — NICOTINE 7 MG/24HR TD PT24
7.0000 mg | MEDICATED_PATCH | Freq: Every day | TRANSDERMAL | Status: DC
Start: 2015-10-17 — End: 2016-03-06

## 2015-10-17 NOTE — Progress Notes (Signed)
Stable. Now growing STaph aureus in sputum.  I suspect it is sensitive since he has been improving.  Will watch for sensitivity and narrow as appropriate.   Dayven Linsley

## 2015-10-17 NOTE — Progress Notes (Signed)
Family Medicine Teaching Service Daily Progress Note Intern Pager: [phone]  Patient name: [name] Medical record number: [id number] Date of birth: 1965-10-04 Age: 50 y.o. Gender: male  Primary Care Provider: Donetta Potts, MD Consultants: Renal Code Status: Full  Pt Overview and Major Events to Date:  1/14: SOB Concerning for PNA vs PE. Elevated lactic acid and RUQ pain  Assessment and Plan: [name] is a 50 y.o. male presenting with shortness of breath, chest pain . PMH is significant for HIV on HAART, ESRD on HD (TTHSA), HSV, Smoking, Depression, peripheral neuropathy.  Cough/Dyspnea likely secondary to pneumonia: improved. Lung exam crackles and diminished air sounds on the right. Also diminished air sounds on the left over lower lung fields likely from poor respiratory effort.with CXR right basilar airspace disease worrisome for pneumonia. CTA negative for PE but with cavitary pneumonia in the right lower lobe and extensive airway debris with lobar collapse, likely large volume. Echo with inferior and posterior lateral hypokinesis and EF of 45-50%. Flu negative. CD4 380 (920 three months ago). Unlikley to be PCP or TB. Patient desated to 84% on RA overnight and started on oxygen by [place]. - Appreciate ID recs - Appreciate CCM recs:  - no indication for bronch with CD4 > 200 - Repeat CXR in 48 hours. - Fungal culture.  -Quantiferon gold. No TB precuation as this is less likely - D/cd heparin gtt 1/16 - Levaquin 1/14 and 1/15 - Continue vanc and cefepime 1/16> - Incentive spirometry - Oxygen as needed  - continue home albuterol as needed  Abdominal pain/Diarrhea:resolved. likley from his pneumonia given improvement with antibiotics. CT abdomen with focal right lower lobe pneumonia and stone in gall bladder (nonobstructive).  Alk phos elevated but no transaminitis. GGT 283 (elevated) - Tylenol for pain-not allergic to plain tylenol,  although allergic to Norco - Consider HIDA scan if no improvement in pain - Consider Chenodiol for gall stone dissolution  HIV: followed by Dr. Michel Bickers. CD-4 920 in 06/2015. Viral load <20 at the same time. On Prezista, Norvir, Tenofovir and Epivir at home. Reports compliance.  - continue home meds - follow further ID recs  H/o of HSV: no lesion -Continued home Valcyclovir  ESRD: Patient unable to report cause. 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 On HD TThSa. Received HD on 1/16 for hyperkalemia -Nephrology following  -HD TThSa. He got HD on 01/16 for hyperkalemia. Next on 01/19  Hyperkalemia: resolved. EKG NSR without T-wave or prolonged PR or wide QRS -Received HD on 01/16 -f/u CMP  HSV: on valtrex 500 mg daily  -continue home valtrex  Thrombocytopenia: improved from 81 on admission to 101 -f/u CBC  Depression: doesn't appear to be on any medication at home. - Will continue to follow  Tobacco abuse: reports smoking about 5 cigarettes a day. -Will give Nicotine patch 7 mg daily  FEN/GI:  Renal diet; SLIV Protonix  Prophylaxis: heparin SubQ  Disposition: discharge home on vanc and ceftazidine with HD  Subjective: No pain or shortness of breath. Cough better still with yellow sputum. No blood streaking Objective: Temp:  [97.8 F (36.6 C)-98.8 F (37.1 C)] 97.8 F (36.6 C) (01/18 0721) Pulse Rate:  [79-93] 93 (01/18 0721) Resp:  [15-20] 18 (01/17 2031) BP: (104-135)/(40-66) 104/40 mmHg (01/18 0721) SpO2:  [84 %-99 %] 97 % (01/18 0721) Physical Exam: Gen: sitting in chair eating breakfast CV: regular rate and rythm. S1 & S2 audible, 2/6 SEM Resp: no apparent work of breathing, crackles  and diminished air sounds on the right. Also diminished air sounds on the left over lower lung fields likely from poor respiratory effort. GI: bowel sounds normal, no tenderness to palpation GU: no suprapubic tenderness, right CVA tenderness Skin: no  lesion  Laboratory:  Recent Labs Lab 10/15/15 0545 10/16/15 1008 10/17/15 0551  WBC 11.9* 14.9* 12.3*  HGB 11.8* 12.3* 12.3*  HCT 35.4* 36.4* 37.0*  PLT 101* 128* 156    Recent Labs Lab 10/14/15 0414  10/15/15 0545 10/16/15 1008 10/17/15 0551  NA 138  < > 134* 138 136  K 5.7*  < > 6.4* 3.8 4.0  CL 96*  < > 97* 95* 95*  CO2 26  < > _0 BUN 26*  < > 54* 52* 70*  CREATININE 8.65*  < > 10.25* 7.05* 8.53*  CALCIUM 9.1  < > 9.8 10.0 10.1  PROT 7.3  --  7.8 7.7  --   BILITOT 1.4*  --  1.7* 0.8  --   ALKPHOS 155*  --  160* 156*  --   ALT 25  --  21 20  --   AST 20  --  19 23  --   GLUCOSE 50*  < > 90 104* 81  < > = values in this interval not displayed. Ddimer: 3.67 Lactic Acid, Venous: 2.8 > 4.7 > 2.6 BNP    Component Value Date/Time   BNP 190.7* 10/13/2015 1013   BNP (last 3 results)  Recent Labs  10/13/15 1013  BNP 190.7*   Imaging/Diagnostic Tests: Ct Abdomen Pelvis Wo Contrast  10/13/2015  ADDENDUM REPORT: 10/13/2015 15:40 ADDENDUM: Patient has a 13 mm gallstone without CT evidence acute cholecystitis, which was noted in the body of the report, but should be included in the impression since it could potentially be the source of patient pain. Electronically Signed   By: Lajean Manes M.D.   On: 10/13/2015 15:40  10/13/2015  1. Focal right lower lobe pneumonia. Additional very bronchovascular interstitial opacities are noted both lower lobes and, to a lesser degree, in the right middle lobe consistent with bronchitis. 2. No acute findings below the diaphragm. 3. 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 Electronically Signed: By: Lajean Manes M.D. On: 10/13/2015 13:32   Dg Chest Port 1 View  10/13/2015  Right basilar airspace disease worrisome for pneumonia. Followup PA and lateral chest X-ray is recommended in 3-4 weeks following trial of antibiotic therapy to ensure resolution and exclude underlying  malignancy. Electronically Signed   By: Marybelle Killings M.D.   On: 10/13/2015 10:27   Ct Angio Chest Pe W/cm &/or Wo Cm  10/15/2015   Cavitary pneumonia in the right lower lobe. 2. Extensive airway debris with lobar collapse, likely large volume aspiration. 3. Negative for pulmonary embolism. Electronically Signed   By: Monte Fantasia M.D.   On: 10/15/2015 10:21    Mercy Riding, MD 10/17/2015, 8:07 AM PGY-1, [place] Intern pager: [phone], text pages welcome

## 2015-10-17 NOTE — Discharge Summary (Signed)
[name] Discharge Summary  Patient name: [name] Medical record number: [id number] Date of birth: [birth year] Age: 50 y.o. Gender: male Date of Admission: 10/13/2015  Date of Discharge: 10/17/2015 Admitting Physician: Caleb La, MD  Primary Care Provider: Donetta Potts, MD Consultants: infectious disease, pulmonary and nephrology  Indication for Hospitalization: shortness of breath and chest pain  Discharge Diagnoses/Problem List:  Patient Active Problem List   Diagnosis Date Noted  . Normocytic anemia 10/14/2015  . Thrombocytopenia ([name]) 10/14/2015  . Dyspnea   . Pain   . SOB (shortness of breath)   . Gall stone   . Right upper quadrant pain   . Right lower lobe pneumonia 10/13/2015  . Community acquired pneumonia   . Boil 07/05/2015  . 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 ESRD (end stage renal disease) on dialysis 01/02/2012  . PERIPHERAL NEUROPATHY 04/10/2009  . BACTEREMIA, MYCOBACTERIUM AVIUM COMPLEX 10/19/2006  . Human immunodeficiency virus (HIV) disease ([place]) 10/19/2006  . HERPES, GENITAL NEC 10/19/2006  . WARTS, OTHER SPECIFIED VIRAL 10/19/2006  . SYPHILIS NOS 10/19/2006  . CIGARETTE SMOKER 10/19/2006  . DEPRESSION 10/19/2006   Disposition: home  Discharge Condition: stable  Discharge Exam:  Temp: [97.8 F (36.6 C)-98.8 F (37.1 C)] 97.8 F (36.6 C) (01/18 0721) Pulse Rate: [79-93] 93 (01/18 0721) Resp: [15-20] 18 (01/17 2031) BP: (104-135)/(40-66) 104/40 mmHg (01/18 0721) SpO2: [84 %-99 %] 97 % (01/18 0721) Physical Exam: Gen: sitting in chair eating breakfast CV: regular rate and rythm. S1 & S2 audible, 2/6 SEM Resp: no apparent work of breathing, crackles and diminished air sounds on the right. Also diminished air sounds on the left over lower lung fields likely from poor respiratory effort. GI: bowel sounds normal, no tenderness to palpation Skin: no lesion  Brief [name]  Course:  [name] is a 50 y.o. male presenting with shortness of breath, productive cough with hemoptysis, right sided chest pain and RUQ pain. PMH is significant for HIV on HAART, ESRD on HD (TTHSA), HSV, Smoking, Depression, peripheral neuropathy.  Cough/Dyspnea/right sided chest pain likely secondary to pneumonia: improved. Patient in a lot of distress from pain on presentation.  Lung exam with crackles on right side, and diminished air sounds over lower lung fields bilaterally. Chest x-ray on 1/14 with right basilar airspace disease worrisome for pneumonia. He was started on Levaquin and heparin drip. CTA chest on 1/16 with cavitary pneumonia in the right lower lobe and extensive airway debris with possible large volume lobar collapse but no PE. As a result, Levaquin and heparin gtt were discontinued. He was transitioned to Vanc and Cefepime. Infectious disease was consulted and helped with the management of his pneumonia in the setting of HIV.  Pulmonary was also consulted for possible bronch but felt there is no indication with CD4 of 380 (920 three months ago). Patient was discharged on vancomycin and cefepime with HD starting 01/19 through 10/27/2015. These prescriptions were phoned in to his HD center by Dr. Lajuana [name]. Sputum culture and Quantiferon gold were pending at the time of discharge. On the day of discharge, patient was without shortness of breath and chest pain. Cough improved and without hemoptysis.   Patient had an Echo on 10/16/2015 with inferior and posterior lateral hypokinesis and EF of 45-50%.   Abdominal pain/Diarrhea:resolved. likley from his pneumonia given improvement with antibiotics. CT abdomen with focal right lower lobe pneumonia and stone in gall bladder (nonobstructive). Alk phos elevated but no transaminitis. GGT  283 (elevated). Pain was managed with as needed fentanyl and tylenol.  HIV: followed by Dr. Michel [name]. CD-4 380 (920 in 06/2015). Viral load  <20 in 06/2015. Patient reports compliance with his medications. We continued him on home Prezista, Norvir, Tenofovir and Epivir.  ESRD: Patient unable to report cause. Likely from [name] per CT abdomen. On HD TThSa. Received HD on 1/16 for hyperkalemia. Next on 01/19 with antibiotics.  Thrombocytopenia: improved from 81 on admission to 156 on discharge.  Issues for Follow Up:  1. Pneumonia: assess improvement. Sputum culture and Quantiferon gold pending at time of discharge 2. HIV: CD-4 count at 380 (920 three months ago). Assess compliance with meds.  3. Cholelithiasis: non-obstructive. Assess need for treatment (stone lysis)  Significant Procedures: none  Significant Labs and Imaging:   Recent Labs Lab 10/15/15 0545 10/16/15 1008 10/17/15 0551  WBC 11.9* 14.9* 12.3*  HGB 11.8* 12.3* 12.3*  HCT 35.4* 36.4* 37.0*  PLT 101* 128* 156    Recent Labs Lab 10/13/15 1013  10/14/15 0414 10/14/15 1524 10/15/15 0545 10/16/15 1008 10/17/15 0551  NA  --   < > 138 137 134* 138 136  K  --   < > 5.7* 6.0* 6.4* 3.8 4.0  CL  --   < > 96* 93* 97* 95* 95*  CO2  --   --  26 26 24 28 26   GLUCOSE  --   < > 50* 64* 90 104* 81  BUN  --   < > 26* 36* 54* 52* 70*  CREATININE  --   < > 8.65* 9.16* 10.25* 7.05* 8.53*  CALCIUM  --   --  9.1 9.5 9.8 10.0 10.1  MG  --   --   --   --  1.9  --   --   ALKPHOS 245*  --  155*  --  160* 156*  --   AST 41  --  20  --  19 23  --   ALT 38  --  25  --  21 20  --   ALBUMIN 3.3*  --  2.7*  --  2.7* 2.5*  --   < > = values in this interval not displayed.  Results/Tests Pending at Time of Discharge: sputum culture and Quantiferon gold  Discharge Medications:    Medication List    STOP taking these medications        HYDROcodone-acetaminophen 5-325 MG tablet  Commonly known as:  NORCO      TAKE these medications        acetaminophen 650 MG CR tablet  Commonly known as:  TYLENOL  Take 1,300 mg by mouth every 6 (six) hours as needed for pain.      albuterol 108 (90 Base) MCG/ACT inhaler  Commonly known as:  PROVENTIL HFA;VENTOLIN HFA  Inhale 1-2 puffs into the lungs every 6 (six) hours as needed for wheezing or shortness of breath.     calcium acetate 667 MG capsule  Commonly known as:  PHOSLO  Take 2,668 mg by mouth See admin instructions. Take 4 capsules (2668 mg) by mouth with meals - once or twice daily     ceFEPIme 2 g injection  Commonly known as:  MAXIPIME  Inject 2 g into the vein Every Tuesday,Thursday,and Saturday with dialysis.     Darunavir Ethanolate 800 MG tablet  Commonly known as:  PREZISTA  Take 1 tablet (800 mg total) by mouth at bedtime. Take with Norvir tablet.  lamiVUDine 10 MG/ML solution  Commonly known as:  EPIVIR  Take 5 mLs (50 mg total) by mouth daily.     nicotine 7 mg/24hr patch  Commonly known as:  NICODERM CQ - dosed in mg/24 hr  Place 1 patch (7 mg total) onto the skin daily.     ritonavir 100 MG Tabs tablet  Commonly known as:  NORVIR  Take 1 tablet (100 mg total) by mouth at bedtime. Take with Prezista tablet.     tenofovir 300 MG tablet  Commonly known as:  VIREAD  Take 1 tablet (300 mg total) by mouth once a week. On Sundays     valACYclovir 500 MG tablet  Commonly known as:  VALTREX  Take 1 tablet (500 mg total) by mouth daily as needed (herpes outbreaks).     vancomycin 750 mg in sodium chloride 0.9 % 150 mL  Inject 750 mg into the vein Every Tuesday,Thursday,and Saturday with dialysis.        Discharge Instructions: Please refer to Patient Instructions section of EMR for full details.  Patient was counseled important signs and symptoms that should prompt return to medical care, changes in medications, dietary instructions, activity restrictions, and follow up appointments.   Follow-Up Appointments: Follow-up Information    Follow up with [facility]. Go on 10/26/2015.   Why:  3:00pm establish care/ [name] follow up   Contact information:   [place]      Mercy Riding, MD 10/18/2015, 5:44 PM PGY-1, [place]

## 2015-10-17 NOTE — Discharge Instructions (Signed)
You were admitted for shortness breath and found to have pneumonia.  You will continue antibiotic treatment outpatient with Hemodialysis.  You have a follow up appointment with Dr Alanda Slim on 1/27.  Please make sure to go to this appointment.   Community-Acquired Pneumonia, Adult Pneumonia is an infection of the lungs. There are different types of pneumonia. One type can develop while a person is in a hospital. A different type, called community-acquired pneumonia, develops in people who are not, or have not recently been, in the hospital or other health care facility.  CAUSES Pneumonia may be caused by bacteria, viruses, or funguses. Community-acquired pneumonia is often caused by Streptococcus pneumonia bacteria. These bacteria are often passed from one person to another by breathing in droplets from the cough or sneeze of an infected person. RISK FACTORS The condition is more likely to develop in:  People who havechronic diseases, such as chronic obstructive pulmonary disease (COPD), asthma, congestive heart failure, cystic fibrosis, diabetes, or kidney disease.  People who haveearly-stage or late-stage HIV.  People who havesickle cell disease.  People who havehad their spleen removed (splenectomy).  People who havepoor Administrator.  People who havemedical conditions that increase the risk of breathing in (aspirating) secretions their own mouth and nose.   People who havea weakened immune system (immunocompromised).  People who smoke.  People whotravel to areas where pneumonia-causing germs commonly exist.  People whoare around animal habitats or animals that have pneumonia-causing germs, including birds, bats, rabbits, cats, and farm animals. SYMPTOMS Symptoms of this condition include:  Adry cough.  A wet (productive) cough.  Fever.  Sweating.  Chest pain, especially when breathing deeply or coughing.  Rapid breathing or difficulty breathing.  Shortness of  breath.  Shaking chills.  Fatigue.  Muscle aches. DIAGNOSIS Your health care provider will take a medical history and perform a physical exam. You may also have other tests, including:  Imaging studies of your chest, including X-rays.  Tests to check your blood oxygen level and other blood gases.  Other tests on blood, mucus (sputum), fluid around your lungs (pleural fluid), and urine. If your pneumonia is severe, other tests may be done to identify the specific cause of your illness. TREATMENT The type of treatment that you receive depends on many factors, such as the cause of your pneumonia, the medicines you take, and other medical conditions that you have. For most adults, treatment and recovery from pneumonia may occur at home. In some cases, treatment must happen in a hospital. Treatment may include:  Antibiotic medicines, if the pneumonia was caused by bacteria.  Antiviral medicines, if the pneumonia was caused by a virus.  Medicines that are given by mouth or through an IV tube.  Oxygen.  Respiratory therapy. Although rare, treating severe pneumonia may include:  Mechanical ventilation. This is done if you are not breathing well on your own and you cannot maintain a safe blood oxygen level.  Thoracentesis. This procedureremoves fluid around one lung or both lungs to help you breathe better. HOME CARE INSTRUCTIONS  Take over-the-counter and prescription medicines only as told by your health care provider.  Only takecough medicine if you are losing sleep. Understand that cough medicine can prevent your body's natural ability to remove mucus from your lungs.  If you were prescribed an antibiotic medicine, take it as told by your health care provider. Do not stop taking the antibiotic even if you start to feel better.  Sleep in a semi-upright position at night. Try  sleeping in a reclining chair, or place a few pillows under your head.  Do not use tobacco products,  including cigarettes, chewing tobacco, and e-cigarettes. If you need help quitting, ask your health care provider.  Drink enough water to keep your urine clear or pale yellow. This will help to thin out mucus secretions in your lungs. PREVENTION There are ways that you can decrease your risk of developing community-acquired pneumonia. Consider getting a pneumococcal vaccine if:  You are older than 48 years of age.  You are older than 48 years of age and are undergoing cancer treatment, have chronic lung disease, or have other medical conditions that affect your immune system. Ask your health care provider if this applies to you. There are different types and schedules of pneumococcal vaccines. Ask your health care provider which vaccination option is best for you. You may also prevent community-acquired pneumonia if you take these actions:  Get an influenza vaccine every year. Ask your health care provider which type of influenza vaccine is best for you.  Go to the dentist on a regular basis.  Wash your hands often. Use hand sanitizer if soap and water are not available. SEEK MEDICAL CARE IF:  You have a fever.  You are losing sleep because you cannot control your cough with cough medicine. SEEK IMMEDIATE MEDICAL CARE IF:  You have worsening shortness of breath.  You have increased chest pain.  Your sickness becomes worse, especially if you are an older adult or have a weakened immune system.  You cough up blood.   This information is not intended to replace advice given to you by your health care provider. Make sure you discuss any questions you have with your health care provider.   Document Released: 09/15/2005 Document Revised: 06/06/2015 Document Reviewed: 01/10/2015 Elsevier Interactive Patient Education [employer].

## 2015-10-17 NOTE — Progress Notes (Signed)
Pt discharged to home. Discharge teaching instructions were given to the patient an his wife. Pt's iv site was removed. Pt given prescriptions. Pt taken downstairs via wheelchair by nurse tech. Pt with no further questions or concerns at this time.

## 2015-10-17 NOTE — Progress Notes (Signed)
Pharmacy Antibiotic Follow-up Note  Assessment/Plan: NEILSON OEHLERT is a 50 y.o.male admitted on 10/13/2015.  The patient is currently on day #4 of abx cavitary pna. Currently on Cefepime monotherapy. 1/14 CT abdomen shows RLL c/w PNA. ID now following. Next HD deferred till Thursday. Also on PTA HIV meds. darunavir, ritonavir, tenofovir. Afebrile, WBC slightly elevated at 12.3. Repeat PCT remains elevated > 175  Plan: Planning to discharge on Ceftazidime (with HD) today. Dialysis dose is Ceftazidime 2 gm IV Q HD. Monitor clinical picture, HD sessions F/U C&S, abx deescalation / LOT   Temp (24hrs), Avg:98.3 F (36.8 C), Min:97.8 F (36.6 C), Max:98.8 F (37.1 C)   Recent Labs Lab 10/13/15 1013 10/14/15 0414 10/15/15 0545 10/16/15 1008 10/17/15 0551  WBC 11.3* 11.7* 11.9* 14.9* 12.3*     Recent Labs Lab 10/14/15 0414 10/14/15 1524 10/15/15 0545 10/16/15 1008 10/17/15 0551  CREATININE 8.65* 9.16* 10.25* 7.05* 8.53*   Estimated Creatinine Clearance: 9.9 mL/min (by C-G formula based on Cr of 8.53).    Allergies  Allergen Reactions  . Contrast Media [Iodinated Diagnostic Agents] Hives and Itching  . Acetaminophen-Codeine Nausea And Vomiting  . Penicillins Hives    Has patient had a PCN reaction causing immediate rash, facial/tongue/throat swelling, SOB or lightheadedness with hypotension: Yes Has patient had a PCN reaction causing severe rash involving mucus membranes or skin necrosis: No Has patient had a PCN reaction that required hospitalization Yes Has patient had a PCN reaction occurring within the last 10 years: No If all of the above answers are "NO", then may proceed with Cephalosporin use.    Antimicrobials this admission: Levaquin 1/14 >> 1/15 Cefepime 1/15 >> Vancomycin 1/15 >> 1/16   Microbiology results: 1/15 MRSA PCR negative 1/15 flu 1/14 BC x2 > ngtd 1/15 Resp cx > pending  Vinnie Level, PharmD., BCPS Clinical Pharmacist Pager  [phone]

## 2015-10-17 NOTE — Progress Notes (Signed)
[facility] Progress Note  Assessment/Plan: 1. RLL PNA - procalcitonin >175; antibiotics per primary/ID guidance - let us know if you wish to give Fortaz with HD after d/c 2. ESRD - TTS - off schedule due to hyperkalemia that resolved with adequte HD;  had HD Monday- went the previous Saturday but didn't have full treatmentd K 4 this am, volume ok - I think we can defer HD until Thursday am - no heparin HD; volume status ok 3. Anemia - Hgb 12.3 - no ESA, on course of Fe 4. Secondary hyperparathyroidism - Hectorol/binders; cal much higher - on 3.5 Ca bath as outpt - evaluate for change at d/c 5. HTN/volume - ok; UF 2 L 1/16 with post wt 65.2 - below edw - had been leaving slightly below EDW previously so  EDW needed to be lowered 6. Nutrition - renal diet/suppl; add vitamins 7. HIV HAART CD4 marked decline to 380 from 920 3 months ago 8. Thrombocytopenia- plts improved today 9. Disp - dialysis first round in the am if not d/c today  Sheffield Slider, PA-C [facility] Beeper [phone] 10/17/2015,10:46 AM  LOS: 4 days   Renal Attending: ID has ok'd OP treatment with ceftazidime for 6 more days.  He looks well other that some side discomfort. Shadoe Bethel C   Subjective:   Feels better  Objective Filed Vitals:   10/16/15 1611 10/16/15 2031 10/16/15 2300 10/17/15 0721  BP: 133/57 135/66  104/40  Pulse: 85 81  93  Temp: 98.2 F (36.8 C) 98.8 F (37.1 C)  97.8 F (36.6 C)  TempSrc: Oral Oral  Oral  Resp: 20 18    Height:      Weight:      SpO2: 95% 84% 99% 97%   Physical Exam General: NAD Heart: RRR  Lungs: dim BS right base; no rales Abdomen: soft Extremities: no sig edema Dialysis Access: left upper AVGG + bruit  Dialysis Orders: [facility] 3h 67kg 2/3.5Ca bath P4 Heparin none LUA AVG 12.9 / 16% / - > Venofer 100 x 10, 6 left 9.3 / 6.9 / 181 > Hect 1 ug  Additional Objective Labs: Basic Metabolic Panel:  Recent Labs Lab  10/15/15 0545 10/16/15 1008 10/17/15 0551  NA 134* 138 136  K 6.4* 3.8 4.0  CL 97* 95* 95*  CO2 GLUCOSE 90 104* 81  BUN 54* 52* 70*  CREATININE 10.25* 7.05* 8.53*  CALCIUM 9.8 10.0 10.1   Liver Function Tests:  Recent Labs Lab 10/14/15 0414 10/15/15 0545 10/16/15 1008  AST ALT ALKPHOS 155* 160* 156*  BILITOT 1.4* 1.7* 0.8  PROT 7.3 7.8 7.7  ALBUMIN 2.7* 2.7* 2.5*   CBC:  Recent Labs Lab 10/13/15 1013  10/14/15 0414 10/15/15 0545 10/16/15 1008 10/17/15 0551  WBC 11.3*  --  11.7* 11.9* 14.9* 12.3*  NEUTROABS 8.8*  --   --   --   --   --   HGB 13.5  < > 12.0* 11.8* 12.3* 12.3*  HCT 41.5  < > 36.7* 35.4* 36.4* 37.0*  MCV 98.1  --  95.8 94.4 94.8 95.1  PLT 101*  --  82* 101* 128* 156  < > = values in this interval not displayed. Blood Culture    Component Value Date/Time   SDES SPUTUM 10/15/2015 0028   SDES SPUTUM 10/15/2015 0028   SPECREQUEST NONE 10/15/2015 0028   SPECREQUEST NONE 10/15/2015 0028  CULT  10/15/2015 0028    MODERATE STAPHYLOCOCCUS AUREUS Note: RIFAMPIN AND GENTAMICIN SHOULD NOT BE USED AS SINGLE DRUGS FOR TREATMENT OF STAPH INFECTIONS. Performed at [facility]    REPTSTATUS 10/15/2015 FINAL 10/15/2015 0028   REPTSTATUS PENDING 10/15/2015 0028     Studies/Results: No results found. Medications:   . antiseptic oral rinse  7 mL Mouth Rinse BID  . calcium acetate  2,668 mg Oral TID WC  . [START ON 10/18/2015] ceFEPime (MAXIPIME) IV  2 g Intravenous Q T,Th,Sa-HD  . Darunavir Ethanolate  800 mg Oral QHS  . doxercalciferol  1 mcg Intravenous Q T,Th,Sa-HD  . feeding supplement (NEPRO CARB STEADY)  237 mL Oral BID BM  . ferric gluconate (FERRLECIT/NULECIT) IV  125 mg Intravenous Q T,Th,Sa-HD  . nicotine  7 mg Transdermal Daily  . ritonavir  100 mg Oral QHS  . sodium polystyrene  30 g Oral Once  . tenofovir  300 mg Oral Q Sun

## 2015-10-18 ENCOUNTER — Telehealth: Payer: Self-pay | Admitting: Internal Medicine

## 2015-10-18 LAB — CULTURE, BLOOD (ROUTINE X 2)
CULTURE: NO GROWTH
Culture: NO GROWTH

## 2015-10-18 LAB — CULTURE, RESPIRATORY

## 2015-10-18 MED ORDER — CEFEPIME HCL 2 G IV SOLR: 2.0000 g | INTRAVENOUS | Status: DC

## 2015-10-18 MED ORDER — SODIUM CHLORIDE 0.9 % IV SOLN: 750.0000 mg | INTRAVENOUS | Status: DC

## 2015-10-18 NOTE — Telephone Encounter (Signed)
[name] has a large right lower lobe lung abscess. Sputum Gram stain showed gram-positive cocci in pairs and clusters and his culture grew methicillin sensitive staph aureus. He was discharged yesterday on IV vancomycin and cefepime. I have faxed orders to his hemodialysis center to change him to cefazolin 2 g IV after hemodialysis 3 times weekly through 11/08/2015. We are arranging follow-up with me next week.

## 2015-10-19 ENCOUNTER — Telehealth: Payer: Self-pay | Admitting: *Deleted

## 2015-10-19 NOTE — Telephone Encounter (Signed)
Patient's wife called regarding his antibiotics. Advised her that Dr. Orvan Falconer faxed an order to his dialysis center and he will be receiving it there three times a week. [name]

## 2015-10-19 NOTE — Addendum Note (Signed)
Addended by: Jennet Maduro D on: 10/19/2015 08:52 AM   Modules accepted: Orders, Medications

## 2015-10-22 ENCOUNTER — Encounter: Payer: Self-pay | Admitting: Internal Medicine

## 2015-10-22 ENCOUNTER — Ambulatory Visit (INDEPENDENT_AMBULATORY_CARE_PROVIDER_SITE_OTHER): Payer: Medicare Other | Admitting: Internal Medicine

## 2015-10-22 VITALS — BP 132/88 | HR 86 | Temp 98.0°F | Wt 141.0 lb

## 2015-10-22 DIAGNOSIS — B20 Human immunodeficiency virus [HIV] disease: Secondary | ICD-10-CM

## 2015-10-22 DIAGNOSIS — F17200 Nicotine dependence, unspecified, uncomplicated: Secondary | ICD-10-CM

## 2015-10-22 DIAGNOSIS — J189 Pneumonia, unspecified organism: Secondary | ICD-10-CM

## 2015-10-22 DIAGNOSIS — J181 Lobar pneumonia, unspecified organism: Principal | ICD-10-CM

## 2015-10-22 MED ORDER — DOLUTEGRAVIR SODIUM 50 MG PO TABS: 50.0000 mg | ORAL_TABLET | Freq: Every day | ORAL | Status: DC

## 2015-10-22 NOTE — Assessment & Plan Note (Signed)
His HIV has been under very good control but he is not on a full regimen. I will change Epivir liquid to [employer] and continue it along with Viread, Prezista and Norvir.

## 2015-10-22 NOTE — Assessment & Plan Note (Signed)
He is improving slowly on therapy for his right lower lobe abscess. I will continue cefazolin and see him back in 2 weeks.

## 2015-10-22 NOTE — Assessment & Plan Note (Signed)
I talked him about the importance of quitting cigarettes. I asked him consider setting a quit date. I will see him back in 2 weeks to reassess his plan.

## 2015-10-22 NOTE — Progress Notes (Signed)
Patient Active Problem List   Diagnosis Date Noted  . Right lower lobe pneumonia 10/13/2015    Priority: High  . ESRD (end stage renal disease) on dialysis 01/02/2012    Priority: Medium  . Human immunodeficiency virus (HIV) disease (HCC) 10/19/2006    Priority: Medium  . CIGARETTE SMOKER 10/19/2006    Priority: Medium  . Normocytic anemia 10/14/2015  . Thrombocytopenia (HCC) 10/14/2015  . Dyspnea   . Pain   . SOB (shortness of breath)   . Gall stone   . Right upper quadrant pain   . Community acquired pneumonia   . Boil 07/05/2015  . 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 PERIPHERAL NEUROPATHY 04/10/2009  . BACTEREMIA, MYCOBACTERIUM AVIUM COMPLEX 10/19/2006  . HERPES, GENITAL NEC 10/19/2006  . WARTS, OTHER SPECIFIED VIRAL 10/19/2006  . SYPHILIS NOS 10/19/2006  . DEPRESSION 10/19/2006    Patient's Medications  New Prescriptions   No medications on file  Previous Medications   ACETAMINOPHEN (TYLENOL) 650 MG CR TABLET    Take 1,300 mg by mouth every 6 (six) hours as needed for pain.   ALBUTEROL (PROVENTIL HFA;VENTOLIN HFA) 108 (90 BASE) MCG/ACT INHALER    Inhale 1-2 puffs into the lungs every 6 (six) hours as needed for wheezing or shortness of breath.   CALCIUM ACETATE (PHOSLO) 667 MG CAPSULE    Take 2,668 mg by mouth See admin instructions. Take 4 capsules (2668 mg) by mouth with meals - once or twice daily   CEFAZOLIN IN DEXTROSE 5 % 50 ML IVPB    Inject 2 g into the vein 3 (three) times a week. After hemodialysis through 11/08/15 at [facility]   DARUNAVIR ETHANOLATE (PREZISTA) 800 MG TABLET    Take 1 tablet (800 mg total) by mouth at bedtime. Take with Norvir tablet.   LAMIVUDINE (EPIVIR) 10 MG/ML SOLUTION    Take 5 mLs (50 mg total) by mouth daily.   NICOTINE (NICODERM CQ - DOSED IN MG/24 HR) 7 MG/24HR PATCH    Place 1 patch (7 mg total) onto the skin daily.   RITONAVIR (NORVIR) 100 MG TABS TABLET    Take 1 tablet (100 mg total) by  mouth at bedtime. Take with Prezista tablet.   TENOFOVIR (VIREAD) 300 MG TABLET    Take 1 tablet (300 mg total) by mouth once a week. On Sundays   VALACYCLOVIR (VALTREX) 500 MG TABLET    Take 1 tablet (500 mg total) by mouth daily as needed (herpes outbreaks).  Modified Medications   No medications on file  Discontinued Medications   No medications on file    Subjective: [name] is in with his wife for his hospital follow-up visit. He was hospitalized about 10 days ago with severe right lower lobe abscess. Sputum cultures grew MSSA. He was discharged on vancomycin and cefepime but I switched him to cefazolin 3 times weekly after hemodialysis. He is now completed 8 days of antibiotic therapy and is starting to feel better. He is still having pleuritic chest pain. He is not having any fever.  He now tells me that he has not taken Epivir in over one year. He does not like the bitter taste. It causes him to gag. He faithfully takes his Viread, Prezista and Norvir.  He has cut down on his cigarette since he became ill but has no current plan to quit.   Review of Systems: Review of Systems  Constitutional: Positive for  malaise/fatigue. Negative for fever, chills, weight loss and diaphoresis.  HENT: Negative for sore throat.   Respiratory: Positive for cough and shortness of breath. Negative for hemoptysis and sputum production.   Cardiovascular: Positive for chest pain.  Gastrointestinal: Negative for nausea, vomiting and diarrhea.  Genitourinary: Negative for dysuria and frequency.  Musculoskeletal: Negative for myalgias and joint pain.  Skin: Negative for rash.  Neurological: Negative for focal weakness.  Psychiatric/Behavioral: Negative for depression and substance abuse. The patient is not nervous/anxious.     Past Medical History  Diagnosis Date  . HIV infection (HCC)   . DJD (degenerative joint disease)     right knee  . Tertiary syphilis   . Genital herpes   . Anal condyloma     . Family history of disseminated HSV infection   . Shortness of breath     "can happen at anytime" (03/04/2013)  . ESRD (end stage renal disease) on dialysis (HCC) 01/02/2012    Gets diaysis at [facility] on [employer], TTS schedule.  Started dialysis in 2000.     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 Reclotted on 03/02/13 and IR placed L IJ tunneled HD catheter (a right external jugular tunneled HD cath was attempted first but did not function due to kinking across the clavicle). Pt was seen by [facility] on same admission 6/614 and said RUA AVF no longer usable, they ordered vein mapping and will see him as outpatient to set up next permanent access.     . Hypertension   . Pneumonia     hx of  . Diarrhea     occurs because of dialysis  . Hemodialysis patient [facility])     Tuesday, Thursday, Saturday    Social History  Substance Use Topics  . Smoking status: Current Every Day Smoker -- 0.50 packs/day for 27 years    Types: Cigarettes  . Smokeless tobacco: Never Used     Comment: cutting back from last visit  . Alcohol Use: 0.0 oz/week    0 Standard drinks or equivalent per week     Comment: 03/04/2013 "stopped drinking ~ 12 yr ago; never had problem w/it"    Family History  Problem Relation Age of Onset  . Diabetes Mother   . Arthritis Father     Gout- Foot    Allergies  Allergen Reactions  . Contrast Media [Iodinated Diagnostic Agents] Hives and Itching  . Acetaminophen-Codeine Nausea And Vomiting  . Penicillins Hives    Has patient had a PCN reaction causing immediate rash, facial/tongue/throat swelling, SOB or lightheadedness with hypotension: Yes Has patient had a PCN reaction causing severe rash involving mucus membranes or skin necrosis: No Has patient had a PCN reaction that required hospitalization Yes Has patient had a PCN reaction occurring within the last 10 years: No If all of the above answers are "NO",  then may proceed with Cephalosporin use.    Objective:  Filed Vitals:   10/22/15 1338  BP: 132/88  Pulse: 86  Temp: 98 F (36.7 C)  TempSrc: Oral  Weight: 141 lb (63.957 kg)   Body mass index is 19.67 kg/(m^2).  Physical Exam  Constitutional: He is oriented to person, place, and time.  He is looking much better and in less pain than when I saw him 8 days ago.  Eyes: Conjunctivae are normal.  Cardiovascular: Normal rate and regular rhythm.   No murmur heard. Pulmonary/Chest:  He has rales.  Rales and decreased breath sounds in right base posteriorly.  Abdominal: Soft. He exhibits no mass. There is no tenderness.  Musculoskeletal: Normal range of motion.  Neurological: He is alert and oriented to person, place, and time.  Skin: No rash noted.  Psychiatric: Mood and affect normal.    Lab Results Lab Results  Component Value Date   WBC 12.3* 10/17/2015   HGB 12.3* 10/17/2015   HCT 37.0* 10/17/2015   MCV 95.1 10/17/2015   PLT 156 10/17/2015    Lab Results  Component Value Date   CREATININE 8.53* 10/17/2015   BUN 70* 10/17/2015   NA 136 10/17/2015   K 4.0 10/17/2015   CL 95* 10/17/2015   CO2 26 10/17/2015    Lab Results  Component Value Date   ALT 20 10/16/2015   AST 23 10/16/2015   ALKPHOS 156* 10/16/2015   BILITOT 0.8 10/16/2015    Lab Results  Component Value Date   CHOL 188 07/05/2015   HDL 65 07/05/2015   LDLCALC 112 07/05/2015   TRIG 53 07/05/2015   CHOLHDL 2.9 07/05/2015    Lab Results HIV 1 RNA QUANT (copies/mL)  Date Value  07/05/2015 <20  07/20/2014 <20  07/26/2013 115*   CD4 T CELL ABS (/uL)  Date Value  10/15/2015 380*  07/05/2015 920  07/20/2014 1030      Problem List Items Addressed This Visit      Medium   Human immunodeficiency virus (HIV) disease (HCC)        Cliffton [name], MD [facility] for Infectious Disease [facility] [phone] pager   [phone] cell 10/22/2015, 1:57 PM

## 2015-10-23 ENCOUNTER — Ambulatory Visit: Payer: Medicare Other | Admitting: Internal Medicine

## 2015-10-23 LAB — HIV-1 RNA QUANT-NO REFLEX-BLD
HIV 1 RNA Quant: 30 copies/mL — ABNORMAL HIGH (ref <20)
HIV-1 RNA QUANT, LOG: 1.48 {Log_copies}/mL — AB (ref <1.30)

## 2015-10-24 LAB — T-HELPER CELL (CD4) - (RCID CLINIC ONLY)
CD4 % Helper T Cell: 26 % — ABNORMAL LOW (ref 33–55)
CD4 T CELL ABS: 820 /uL (ref 400–2700)

## 2015-10-26 ENCOUNTER — Ambulatory Visit (INDEPENDENT_AMBULATORY_CARE_PROVIDER_SITE_OTHER): Payer: Medicare Other | Admitting: Student

## 2015-10-26 ENCOUNTER — Encounter: Payer: Self-pay | Admitting: Student

## 2015-10-26 VITALS — BP 97/49 | HR 85 | Temp 97.7°F | Ht 71.0 in | Wt 141.0 lb

## 2015-10-26 DIAGNOSIS — B20 Human immunodeficiency virus [HIV] disease: Secondary | ICD-10-CM | POA: Diagnosis not present

## 2015-10-26 DIAGNOSIS — J181 Lobar pneumonia, unspecified organism: Principal | ICD-10-CM

## 2015-10-26 DIAGNOSIS — J189 Pneumonia, unspecified organism: Secondary | ICD-10-CM | POA: Diagnosis present

## 2015-10-26 NOTE — Patient Instructions (Signed)
It was great seeing you today! We have addressed the following issues today  1. Pneumonia: I am glad you are feeling better. Pain might take sometimes to resolve completley. I won't be worried as long as it continues to get better. Things to watch are fever, shortness of breath, blood in sputum or worsening pain. If you notice one or two of the above symptoms, please return to clinic or go to emergency department.   If we did any lab work today, and the results require attention, either me or my nurse will get in touch with you. If everything is normal, you will get a letter in mail. If you don't hear from [place] in two weeks, please give [place] a call. Otherwise, I look forward to talking with you again at our next visit. If you have any questions or concerns before then, please call the clinic at [phone].  Please bring all your medications to every doctors visit   Sign up for My Chart to have easy access to your labs results, and communication with your Primary care physician.    Please check-out at the front desk before leaving the clinic.   Take Care,

## 2015-10-26 NOTE — Assessment & Plan Note (Addendum)
Bing followed by Dr. Orvan Falconer. Last CD4 count 820. Reports good compliance with HAART. He already has his flu vaccine this year. Also had his PPSV-23 vaccine.

## 2015-10-26 NOTE — Progress Notes (Signed)
   Subjective:    Patient ID: [name], male    DOB: 07/30/1966, 50 y.o.   MRN: [id number]  HPI Follow up on pneumonia: reports chest pain. This is only when he coughs. It is improving as well. Reports some whitish phlegm but denies hemoptysis. Denies fever, shortness of breath or chills. He was discharged on Vanc and Cefepime for two weeks with HD. He says his ID doctor decided to continue treatment with Cefazolin for another two weeks. He is on HAART for HIV. Reports good compliance. He is being followed by Dr. Orvan Falconer. He was last seen on 10/22/2015. He has his flu shot this year. He also had his PCV-23 vaccine.  PHQ-2-0   Review of Systems No fever, chills, nausea, vomiting or anorexia.     Objective:   Physical Exam Gen: appears well, sitting on exam room without distress Nares: clear, no erythema, swelling or congestion Oropharynx: clear, moist Neck: supple, no LAD CV: regular rate and rythm. S1 & S2 audible, 3/6 murmurs all over (likely from fistula) Resp: no apparent work of breathing, clear to auscultation bilaterally. GI: bowel sounds normal, mildly tender to palpation over right upper quadrant, no rebound or guarding, no mass.  GU: no suprapubic tenderness, no CVA Skin: no lesion MSK: fistula in right arm    Assessment & Plan:  Right lower lobe pneumonia Improving. Completed two weeks course of Vanc and Cefepime with HD. Saw Dr. Bonnita Nasuti on 10/22/2015, who continued him on Cefazolin 3 (three) times a week after HD for another two weeks (through 11/08/15). Denies fever, chills or hemoptysis.   Human immunodeficiency virus (HIV) disease (HCC) Bing followed by Dr. Orvan Falconer. Last CD4 count 820. Reports good compliance with HAART. He already has his flu vaccine this year. Also had his PPSV-23 vaccine.

## 2015-10-26 NOTE — Assessment & Plan Note (Signed)
Improving. Completed two weeks course of Vanc and Cefepime with HD. Saw Dr. Bonnita Nasuti on 10/22/2015, who continued him on Cefazolin 3 (three) times a week after HD for another two weeks (through 11/08/15). Denies fever, chills or hemoptysis.

## 2015-11-08 ENCOUNTER — Ambulatory Visit (INDEPENDENT_AMBULATORY_CARE_PROVIDER_SITE_OTHER): Payer: Medicare Other | Admitting: Internal Medicine

## 2015-11-08 ENCOUNTER — Ambulatory Visit
Admission: RE | Admit: 2015-11-08 | Discharge: 2015-11-08 | Disposition: A | Discharge status: Home / Self Care | Payer: Medicare Other | Source: Ambulatory Visit | Attending: Internal Medicine | Admitting: Internal Medicine

## 2015-11-08 ENCOUNTER — Encounter: Payer: Self-pay | Admitting: Internal Medicine

## 2015-11-08 VITALS — BP 134/85 | HR 72 | Temp 97.7°F | Ht 71.0 in | Wt 146.0 lb

## 2015-11-08 DIAGNOSIS — B20 Human immunodeficiency virus [HIV] disease: Secondary | ICD-10-CM | POA: Diagnosis not present

## 2015-11-08 DIAGNOSIS — J189 Pneumonia, unspecified organism: Secondary | ICD-10-CM

## 2015-11-08 DIAGNOSIS — F17200 Nicotine dependence, unspecified, uncomplicated: Secondary | ICD-10-CM | POA: Diagnosis present

## 2015-11-08 DIAGNOSIS — J181 Lobar pneumonia, unspecified organism: Principal | ICD-10-CM

## 2015-11-08 NOTE — Assessment & Plan Note (Addendum)
He is much improved on therapy for MSSA pneumonia. I will stop IV cefazolin today and repeat his chest x-ray to obtain a new baseline film. His QuantiFERON TB gold assay was indeterminate when he was hospitalized. This is an insignificant finding and does not need any repeat testing or treatment.

## 2015-11-08 NOTE — Assessment & Plan Note (Signed)
I talked to him at length today about the importance of cigarette cessation. He has some desire to quit and certainly his wife is very interested in having him quit. I asked him to set a quit date and reviewed a cigarette cessation plan with him.

## 2015-11-08 NOTE — Progress Notes (Signed)
Patient Active Problem List   Diagnosis Date Noted  . Right lower lobe pneumonia 10/13/2015    Priority: High  . ESRD (end stage renal disease) on dialysis 01/02/2012    Priority: Medium  . Human immunodeficiency virus (HIV) disease (HCC) 10/19/2006    Priority: Medium  . CIGARETTE SMOKER 10/19/2006    Priority: Medium  . Normocytic anemia 10/14/2015  . Thrombocytopenia (HCC) 10/14/2015  . Gall stone   . Right upper quadrant pain   . 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 PERIPHERAL NEUROPATHY 04/10/2009  . BACTEREMIA, MYCOBACTERIUM AVIUM COMPLEX 10/19/2006  . HERPES, GENITAL NEC 10/19/2006  . WARTS, OTHER SPECIFIED VIRAL 10/19/2006  . SYPHILIS NOS 10/19/2006  . DEPRESSION 10/19/2006    Patient's Medications  New Prescriptions   No medications on file  Previous Medications   ACETAMINOPHEN (TYLENOL) 650 MG CR TABLET    Take 1,300 mg by mouth every 6 (six) hours as needed for pain.   ALBUTEROL (PROVENTIL HFA;VENTOLIN HFA) 108 (90 BASE) MCG/ACT INHALER    Inhale 1-2 puffs into the lungs every 6 (six) hours as needed for wheezing or shortness of breath.   CALCIUM ACETATE (PHOSLO) 667 MG CAPSULE    Take 2,668 mg by mouth See admin instructions. Take 4 capsules (2668 mg) by mouth with meals - once or twice daily   CEFAZOLIN IN DEXTROSE 5 % 50 ML IVPB    Inject 2 g into the vein 3 (three) times a week. After hemodialysis through 11/08/15 at [facility]   DARUNAVIR ETHANOLATE (PREZISTA) 800 MG TABLET    Take 1 tablet (800 mg total) by mouth at bedtime. Take with Norvir tablet.   DOLUTEGRAVIR (TIVICAY) 50 MG TABLET    Take 1 tablet (50 mg total) by mouth daily.   NICOTINE (NICODERM CQ - DOSED IN MG/24 HR) 7 MG/24HR PATCH    Place 1 patch (7 mg total) onto the skin daily.   RITONAVIR (NORVIR) 100 MG TABS TABLET    Take 1 tablet (100 mg total) by mouth at bedtime. Take with Prezista tablet.   TENOFOVIR (VIREAD) 300 MG TABLET    Take 1 tablet (300 mg  total) by mouth once a week. On Sundays   VALACYCLOVIR (VALTREX) 500 MG TABLET    Take 1 tablet (500 mg total) by mouth daily as needed (herpes outbreaks).  Modified Medications   No medications on file  Discontinued Medications   No medications on file    Subjective: [name] is in with his wife for his routine follow-up visit. He is feeling much better. He is now completed 25 days of IV cefazolin for his MSSA right lower lobe pneumonia. He is no longer having any cough or shortness of breath. His pleuritic chest pain has resolved. He has not had any recent fevers. He denies missing any doses of his HIV medications recently. He continues to smoke cigarettes. He estimates that he's had about 12 cigarettes in the past week.   Review of Systems: Review of Systems  Constitutional: Negative for fever, chills, weight loss, malaise/fatigue and diaphoresis.  HENT: Negative for sore throat.   Respiratory: Negative for cough, sputum production and shortness of breath.   Cardiovascular: Negative for chest pain.  Gastrointestinal: Negative for nausea, vomiting and diarrhea.  Musculoskeletal: Negative for joint pain.  Skin: Negative for rash.  Neurological: Negative for sensory change and headaches.  Psychiatric/Behavioral: Negative for depression and substance abuse. The patient is  not nervous/anxious.     Past Medical History  Diagnosis Date  . HIV infection (HCC)   . DJD (degenerative joint disease)     right knee  . Tertiary syphilis   . Genital herpes   . Anal condyloma   . Family history of disseminated HSV infection   . Shortness of breath     "can happen at anytime" (03/04/2013)  . ESRD (end stage renal disease) on dialysis (HCC) 01/02/2012    Gets diaysis at [facility] on [employer], TTS schedule.  Started dialysis in 2000.     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 Reclotted on 03/02/13 and IR placed L IJ tunneled  HD catheter (a right external jugular tunneled HD cath was attempted first but did not function due to kinking across the clavicle). Pt was seen by [facility] on same admission 6/614 and said RUA AVF no longer usable, they ordered vein mapping and will see him as outpatient to set up next permanent access.     . Hypertension   . Pneumonia     hx of  . Diarrhea     occurs because of dialysis  . Hemodialysis patient [facility])     Tuesday, Thursday, Saturday    Social History  Substance Use Topics  . Smoking status: Light Tobacco Smoker -- 0.10 packs/day for 27 years    Types: Cigarettes  . Smokeless tobacco: Never Used     Comment: cutting back from last visit  . Alcohol Use: 0.0 oz/week    0 Standard drinks or equivalent per week     Comment: 03/04/2013 "stopped drinking ~ 12 yr ago; never had problem w/it"    Family History  Problem Relation Age of Onset  . Diabetes Mother   . Arthritis Father     Gout- Foot    Allergies  Allergen Reactions  . Contrast Media [Iodinated Diagnostic Agents] Hives and Itching  . Acetaminophen-Codeine Nausea And Vomiting  . Penicillins Hives    Has patient had a PCN reaction causing immediate rash, facial/tongue/throat swelling, SOB or lightheadedness with hypotension: Yes Has patient had a PCN reaction causing severe rash involving mucus membranes or skin necrosis: No Has patient had a PCN reaction that required hospitalization Yes Has patient had a PCN reaction occurring within the last 10 years: No If all of the above answers are "NO", then may proceed with Cephalosporin use.    Objective:  Filed Vitals:   11/08/15 1028  BP: 134/85  Pulse: 72  Temp: 97.7 F (36.5 C)  TempSrc: Oral  Height:  (1.803 m)  Weight: 146 lb (66.225 kg)   Body mass index is 20.37 kg/(m^2).  Physical Exam  Constitutional: He is oriented to person, place, and time.  He looks like he is feeling better. He is in good spirits.  HENT:  Mouth/Throat: No oropharyngeal  exudate.  Eyes: Conjunctivae are normal.  Cardiovascular: Normal rate and regular rhythm.   No murmur heard. Pulmonary/Chest: Effort normal and breath sounds normal. He has no wheezes. He has no rales.  His breath sounds are slightly diminished in the right base posteriorly.  Abdominal: Soft. He exhibits no mass. There is no tenderness.  Neurological: He is alert and oriented to person, place, and time.  Skin: No rash noted.  Psychiatric: Mood and affect normal.    Lab Results Lab Results  Component Value Date   WBC 12.3* 10/17/2015  HGB 12.3* 10/17/2015   HCT 37.0* 10/17/2015   MCV 95.1 10/17/2015   PLT 156 10/17/2015    Lab Results  Component Value Date   CREATININE 8.53* 10/17/2015   BUN 70* 10/17/2015   NA 136 10/17/2015   K 4.0 10/17/2015   CL 95* 10/17/2015   CO2 26 10/17/2015    Lab Results  Component Value Date   ALT 20 10/16/2015   AST 23 10/16/2015   ALKPHOS 156* 10/16/2015   BILITOT 0.8 10/16/2015    Lab Results  Component Value Date   CHOL 188 07/05/2015   HDL 65 07/05/2015   LDLCALC 112 07/05/2015   TRIG 53 07/05/2015   CHOLHDL 2.9 07/05/2015    Lab Results HIV 1 RNA QUANT (copies/mL)  Date Value  10/22/2015 30*  07/05/2015 <20  07/20/2014 <20   CD4 T CELL ABS (/uL)  Date Value  10/22/2015 820  10/15/2015 380*  07/05/2015 920      Problem List Items Addressed This Visit      High   Right lower lobe pneumonia    He is much improved on therapy for MSSA pneumonia. I will stop IV cefazolin today and repeat his chest x-ray to obtain a new baseline film. His QuantiFERON TB gold assay was indeterminate when he was hospitalized. This is an insignificant finding and does not need any repeat testing or treatment.      Relevant Orders   DG Chest 2 View     Medium   CIGARETTE SMOKER - Primary    I talked to him at length today about the importance of cigarette cessation. He has some desire to quit and certainly his wife is very interested in  having him quit. I asked him to set a quit date and reviewed a cigarette cessation plan with him.      Human immunodeficiency virus (HIV) disease (HCC)    His infection remains under very good control. I will continue his current antiretroviral regimen and see him back after blood work in 3 months.      Relevant Orders   T-helper cell (CD4)- ([facility] clinic only)   HIV 1 RNA quant-no reflex-bld   CBC   Comprehensive metabolic panel   RPR        Cliffton [name], MD [facility] for Infectious Disease [facility] [phone] pager   [phone] cell 11/08/2015, 11:02 AM

## 2015-11-08 NOTE — Assessment & Plan Note (Signed)
His infection remains under very good control. I will continue his current antiretroviral regimen and see him back after blood work in 3 months.

## 2016-01-01 ENCOUNTER — Telehealth: Payer: Self-pay | Admitting: Internal Medicine

## 2016-01-01 NOTE — Telephone Encounter (Signed)
Called patient to let him know his SPAP card came in the mail, will leave it at the front desk for pick up. Left vm.

## 2016-02-05 ENCOUNTER — Encounter: Payer: Self-pay | Admitting: Internal Medicine

## 2016-02-05 ENCOUNTER — Ambulatory Visit (INDEPENDENT_AMBULATORY_CARE_PROVIDER_SITE_OTHER): Payer: Medicare Other | Admitting: Internal Medicine

## 2016-02-05 VITALS — BP 111/68 | HR 66 | Temp 97.3°F | Wt 148.0 lb

## 2016-02-05 DIAGNOSIS — J189 Pneumonia, unspecified organism: Secondary | ICD-10-CM

## 2016-02-05 DIAGNOSIS — B20 Human immunodeficiency virus [HIV] disease: Secondary | ICD-10-CM | POA: Diagnosis not present

## 2016-02-05 DIAGNOSIS — J181 Lobar pneumonia, unspecified organism: Principal | ICD-10-CM

## 2016-02-05 DIAGNOSIS — F17200 Nicotine dependence, unspecified, uncomplicated: Secondary | ICD-10-CM

## 2016-02-05 NOTE — Assessment & Plan Note (Signed)
His viral load reactivated slightly to 30 shortly after he was hospitalized with pneumonia earlier this year. He had a transient dip in his CD4 count but he rebounded quickly to normal. I will continue his current antiretroviral regimen, repeat blood work today and have him follow-up in 6 months.

## 2016-02-05 NOTE — Assessment & Plan Note (Signed)
His pneumonia has resolved. He knows that his cigarettes put him at increased risk for recurrent and severe pneumonias.

## 2016-02-05 NOTE — Progress Notes (Signed)
Patient Active Problem List   Diagnosis Date Noted  . ESRD (end stage renal disease) on dialysis 01/02/2012    Priority: Medium  . Human immunodeficiency virus (HIV) disease (HCC) 10/19/2006    Priority: Medium  . CIGARETTE SMOKER 10/19/2006    Priority: Medium  . Normocytic anemia 10/14/2015  . Thrombocytopenia (HCC) 10/14/2015  . Gall stone   . Right upper quadrant pain   . Right lower lobe pneumonia 10/13/2015  . 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 PERIPHERAL NEUROPATHY 04/10/2009  . BACTEREMIA, MYCOBACTERIUM AVIUM COMPLEX 10/19/2006  . HERPES, GENITAL NEC 10/19/2006  . WARTS, OTHER SPECIFIED VIRAL 10/19/2006  . SYPHILIS NOS 10/19/2006  . DEPRESSION 10/19/2006    Patient's Medications  New Prescriptions   No medications on file  Previous Medications   ACETAMINOPHEN (TYLENOL) 650 MG CR TABLET    Take 1,300 mg by mouth every 6 (six) hours as needed for pain.   ALBUTEROL (PROVENTIL HFA;VENTOLIN HFA) 108 (90 BASE) MCG/ACT INHALER    Inhale 1-2 puffs into the lungs every 6 (six) hours as needed for wheezing or shortness of breath.   CALCIUM ACETATE (PHOSLO) 667 MG CAPSULE    Take 2,668 mg by mouth See admin instructions. Take 4 capsules (2668 mg) by mouth with meals - once or twice daily   DARUNAVIR ETHANOLATE (PREZISTA) 800 MG TABLET    Take 1 tablet (800 mg total) by mouth at bedtime. Take with Norvir tablet.   DOLUTEGRAVIR (TIVICAY) 50 MG TABLET    Take 1 tablet (50 mg total) by mouth daily.   NICOTINE (NICODERM CQ - DOSED IN MG/24 HR) 7 MG/24HR PATCH    Place 1 patch (7 mg total) onto the skin daily.   RITONAVIR (NORVIR) 100 MG TABS TABLET    Take 1 tablet (100 mg total) by mouth at bedtime. Take with Prezista tablet.   TENOFOVIR (VIREAD) 300 MG TABLET    Take 1 tablet (300 mg total) by mouth once a week. On Sundays   VALACYCLOVIR (VALTREX) 500 MG TABLET    Take 1 tablet (500 mg total) by mouth daily as needed (herpes outbreaks).  Modified  Medications   No medications on file  Discontinued Medications   CEFAZOLIN IN DEXTROSE 5 % 50 ML IVPB    Inject 2 g into the vein 3 (three) times a week. [facility]    Subjective: Caleb GensJeffrey is in for his routine HIV follow-up visit. He states that he is feeling much better and feels like he is completely over his bout of staph aureus pneumonia earlier this year. He is having minimal, infrequent cough and no recent problems with shortness of breath. He has not had any fever, chills or sweats. He can correctly pick all of his for HIV medications off of the chart. He is taking once weekly Viread along with daily Tivicay, Prezista and Norvir. He uses a pillbox. He takes his medications around 7 PM each day. He does not recall missing any doses. He continues to smoke cigarettes. He states that he has slacked off some but has no current plan to quit completely. He has started restricting where he can smoke inside of his house. His wife does not smoke. He is hoping to get a new apartment as he continues to struggle with his landlord who is not very responsive to fixing problems.   Review of Systems: Review of Systems  Constitutional: Negative  for fever, chills, weight loss, malaise/fatigue and diaphoresis.  HENT: Negative for sore throat.   Respiratory: Positive for cough. Negative for sputum production and shortness of breath.   Cardiovascular: Negative for chest pain.  Gastrointestinal: Negative for nausea, vomiting, abdominal pain and diarrhea.  Musculoskeletal: Negative for myalgias and joint pain.  Skin: Negative for rash.  Neurological: Negative for dizziness and headaches.  Psychiatric/Behavioral: Negative for depression and substance abuse. The patient is not nervous/anxious.     Past Medical History  Diagnosis Date  . HIV infection (HCC)   . DJD (degenerative joint disease)     right knee  . Tertiary syphilis   . Genital herpes   . Anal  condyloma   . Family history of disseminated HSV infection   . Shortness of breath     "can happen at anytime" (03/04/2013)  . ESRD (end stage renal disease) on dialysis (HCC) 01/02/2012    Gets diaysis at [facility] on [employer], TTS schedule.  Started dialysis in 2000.     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 Reclotted on 03/02/13 and IR placed L IJ tunneled HD catheter (a right external jugular tunneled HD cath was attempted first but did not function due to kinking across the clavicle). Pt was seen by VVS on same admission 6/614 and said RUA AVF no longer usable, they ordered vein mapping and will see him as outpatient to set up next permanent access.     . Hypertension   . Pneumonia     hx of  . Diarrhea     occurs because of dialysis  . Hemodialysis patient [facility])     Tuesday, Thursday, Saturday    Social History  Substance Use Topics  . Smoking status: Light Tobacco Smoker -- 0.10 packs/day for 27 years    Types: Cigarettes  . Smokeless tobacco: Never Used     Comment: cutting back from last visit  . Alcohol Use: 0.0 oz/week    0 Standard drinks or equivalent per week     Comment: 03/04/2013 "stopped drinking ~ 12 yr ago; never had problem w/it"    Family History  Problem Relation Age of Onset  . Diabetes Mother   . Arthritis Father     Gout- Foot    Allergies  Allergen Reactions  . Contrast Media [Iodinated Diagnostic Agents] Hives and Itching  . Acetaminophen-Codeine Nausea And Vomiting  . Penicillins Hives    Has patient had a PCN reaction causing immediate rash, facial/tongue/throat swelling, SOB or lightheadedness with hypotension: Yes Has patient had a PCN reaction causing severe rash involving mucus membranes or skin necrosis: No Has patient had a PCN reaction that required hospitalization Yes Has patient had a PCN reaction occurring within the last 10 years: No If all of the above answers  are "NO", then may proceed with Cephalosporin use.    Objective:  Filed Vitals:   02/05/16 1119  BP: 111/68  Pulse: 66  Temp: 97.3 F (36.3 C)  TempSrc: Oral  Weight: 148 lb (67.132 kg)   Body mass index is 20.65 kg/(m^2).  Physical Exam  Constitutional: He is oriented to person, place, and time.  He is a little sluggish as he normally is right after getting off of hemodialysis but otherwise is in good spirits.  HENT:  Mouth/Throat: No oropharyngeal exudate.  Eyes: Conjunctivae are normal.  Cardiovascular: Normal rate and regular rhythm.   No murmur  heard. Pulmonary/Chest: Breath sounds normal. He has no wheezes. He has no rales.  Abdominal: Soft. He exhibits no mass. There is no tenderness.  Musculoskeletal: Normal range of motion.  Neurological: He is alert and oriented to person, place, and time.  Skin: No rash noted.  Psychiatric: Mood and affect normal.    Lab Results Lab Results  Component Value Date   WBC 12.3* 10/17/2015   HGB 12.3* 10/17/2015   HCT 37.0* 10/17/2015   MCV 95.1 10/17/2015   PLT 156 10/17/2015    Lab Results  Component Value Date   CREATININE 8.53* 10/17/2015   BUN 70* 10/17/2015   NA 136 10/17/2015   K 4.0 10/17/2015   CL 95* 10/17/2015   CO2 26 10/17/2015    Lab Results  Component Value Date   ALT 20 10/16/2015   AST 23 10/16/2015   ALKPHOS 156* 10/16/2015   BILITOT 0.8 10/16/2015    Lab Results  Component Value Date   CHOL 188 07/05/2015   HDL 65 07/05/2015   LDLCALC 112 07/05/2015   TRIG 53 07/05/2015   CHOLHDL 2.9 07/05/2015    Lab Results HIV 1 RNA QUANT (copies/mL)  Date Value  10/22/2015 30*  07/05/2015 <20  07/20/2014 <20   CD4 T CELL ABS (/uL)  Date Value  10/22/2015 820  10/15/2015 380*  07/05/2015 920      Problem List Items Addressed This Visit      Medium   CIGARETTE SMOKER    I talked to him again today about the importance of cigarette cessation. He should never have any complications of his HIV  infection but his cigarette use puts him at increased risk for bad outcome such as heart attacks, strokes, COPD, respiratory infections and cancer.      Human immunodeficiency virus (HIV) disease (HCC)    His viral load reactivated slightly to 30 shortly after he was hospitalized with pneumonia earlier this year. He had a transient dip in his CD4 count but he rebounded quickly to normal. I will continue his current antiretroviral regimen, repeat blood work today and have him follow-up in 6 months.      Relevant Orders   T-helper cell (CD4)- ([facility] clinic only)   HIV 1 RNA quant-no reflex-bld     Unprioritized   Right lower lobe pneumonia - Primary    His pneumonia has resolved. He knows that his cigarettes put him at increased risk for recurrent and severe pneumonias.           Cliffton [name], MD [facility] [phone] pager   [phone] cell 02/05/2016, 11:49 AM

## 2016-02-05 NOTE — Assessment & Plan Note (Signed)
I talked to him again today about the importance of cigarette cessation. He should never have any complications of his HIV infection but his cigarette use puts him at increased risk for bad outcome such as heart attacks, strokes, COPD, respiratory infections and cancer.

## 2016-02-06 LAB — T-HELPER CELL (CD4) - (RCID CLINIC ONLY)
CD4 T CELL ABS: 1000 /uL (ref 400–2700)
CD4 T CELL HELPER: 31 % — AB (ref 33–55)

## 2016-02-06 LAB — HIV-1 RNA QUANT-NO REFLEX-BLD: HIV-1 RNA Quant, Log: <1.3 Log copies/mL (ref <1.30)

## 2016-03-05 ENCOUNTER — Encounter: Payer: Self-pay | Admitting: Vascular Surgery

## 2016-03-06 ENCOUNTER — Other Ambulatory Visit: Payer: Self-pay

## 2016-03-06 ENCOUNTER — Ambulatory Visit (INDEPENDENT_AMBULATORY_CARE_PROVIDER_SITE_OTHER): Payer: Medicare Other | Admitting: Vascular Surgery

## 2016-03-06 VITALS — BP 127/80 | HR 68 | Temp 97.1°F | Resp 14 | Ht 71.0 in | Wt 153.0 lb

## 2016-03-06 DIAGNOSIS — N186 End stage renal disease: Secondary | ICD-10-CM | POA: Diagnosis not present

## 2016-03-06 DIAGNOSIS — Z992 Dependence on renal dialysis: Secondary | ICD-10-CM | POA: Diagnosis not present

## 2016-03-06 NOTE — Progress Notes (Signed)
[facility] [place] HISTORY AND PHYSICAL   History of Present Illness:  Patient is a 50 y.o. male who presents for placement of a permanent hemodialysis access. The patient has previously had exploration of the cephalic vein at the left wrist which was inadequate. He had a left brachiocephalic AV fistula which lasted a few years. This is now occluded. Patient previously had a right brachiocephalic AV fistula which was eventually converted to a right upper arm AV graft. These are all occluded as well. The patient currently has a catheter in the left internal jugular vein. His dialysis today is Tuesday Thursday Saturday.  Other chronic medical problems include HIV and hypertension both of which are currently stable..  Past Medical History  Diagnosis Date  . HIV infection (HCC)   . DJD (degenerative joint disease)     right knee  . Tertiary syphilis   . Genital herpes   . Anal condyloma   . Family history of disseminated HSV infection   . Shortness of breath     "can happen at anytime" (03/04/2013)  . ESRD (end stage renal disease) on dialysis (HCC) 01/02/2012    Gets diaysis at [facility] on [employer], TTS schedule.  Started dialysis in 2000.     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 Reclotted on 03/02/13 and IR placed L IJ tunneled HD catheter (a right external jugular tunneled HD cath was attempted first but did not function due to kinking across the clavicle). Pt was seen by [facility] on same admission 6/614 and said RUA AVF no longer usable, they ordered vein mapping and will see him as outpatient to set up next permanent access.     . Hypertension   . Pneumonia     hx of  . Diarrhea     occurs because of dialysis  . Hemodialysis patient [facility])     Tuesday, Thursday, Saturday    Past Surgical History  Procedure Laterality Date  . Av fistula placement, brachiocephalic Right 06/16/11  . Appendectomy     . Tonsillectomy    . Cholecystectomy    . Incision and drainage abscess      abdominal  . Av fistula placement Right 06/16/11  . Revison of arteriovenous fistula Right 04/20/2013    Procedure: INSERTION OF ARTERIOVENOUS GORTEX GRAFT;  Surgeon: Sherren Kerns, MD;  Location: [facility] OR;  Service: Vascular;  Laterality: Right;  Ultrasound Guided     Social History Social History  Substance Use Topics  . Smoking status: Light Tobacco Smoker -- 0.10 packs/day for 27 years    Types: Cigarettes  . Smokeless tobacco: Never Used     Comment: cutting back from last visit  . Alcohol Use: 0.0 oz/week    0 Standard drinks or equivalent per week     Comment: 03/04/2013 "stopped drinking ~ 12 yr ago; never had problem w/it"    Family History Family History  Problem Relation Age of Onset  . Diabetes Mother   . Arthritis Father     Gout- Foot    Allergies  Allergies  Allergen Reactions  . Contrast Media [Iodinated Diagnostic Agents] Hives and Itching  . Acetaminophen-Codeine Nausea And Vomiting  . Penicillins Hives    Has patient had a PCN reaction causing immediate rash, facial/tongue/throat swelling, SOB or lightheadedness with hypotension: Yes Has patient had a PCN reaction causing severe rash involving mucus membranes or skin necrosis:  No Has patient had a PCN reaction that required hospitalization Yes Has patient had a PCN reaction occurring within the last 10 years: No If all of the above answers are "NO", then may proceed with Cephalosporin use.     Current Outpatient Prescriptions  Medication Sig Dispense Refill  . acetaminophen (TYLENOL) 650 MG CR tablet Take 1,300 mg by mouth every 6 (six) hours as needed for pain.    Marland Kitchen. albuterol (PROVENTIL HFA;VENTOLIN HFA) 108 (90 BASE) MCG/ACT inhaler Inhale 1-2 puffs into the lungs every 6 (six) hours as needed for wheezing or shortness of breath. 1 Inhaler 0  . calcium acetate (PHOSLO) 667 MG capsule Take 2,668 mg by mouth See admin  instructions. Take 4 capsules (2668 mg) by mouth with meals - once or twice daily    . Darunavir Ethanolate (PREZISTA) 800 MG tablet Take 1 tablet (800 mg total) by mouth at bedtime. Take with Norvir tablet. 30 tablet 11  . dolutegravir (TIVICAY) 50 MG tablet Take 1 tablet (50 mg total) by mouth daily. 30 tablet 11  . nicotine (NICODERM CQ - DOSED IN MG/24 HR) 7 mg/24hr patch Place 1 patch (7 mg total) onto the skin daily. 28 patch 0  . ritonavir (NORVIR) 100 MG TABS tablet Take 1 tablet (100 mg total) by mouth at bedtime. Take with Prezista tablet. 30 tablet 11  . tenofovir (VIREAD) 300 MG tablet Take 1 tablet (300 mg total) by mouth once a week. On Sundays (Patient taking differently: Take 300 mg by mouth every Sunday. On Sundays) 4 tablet 11  . valACYclovir (VALTREX) 500 MG tablet Take 1 tablet (500 mg total) by mouth daily as needed (herpes outbreaks). 30 tablet 5   No current facility-administered medications for this visit.    ROS:   General:  No weight loss, Fever, chills  HEENT: No recent headaches, no nasal bleeding, no visual changes, no sore throat  Neurologic: No dizziness, blackouts, seizures. No recent symptoms of stroke or mini- stroke. No recent episodes of slurred speech, or temporary blindness.  Cardiac: No recent episodes of chest pain/pressure, no shortness of breath at rest.  No shortness of breath with exertion.  Denies history of atrial fibrillation or irregular heartbeat  Vascular: No history of rest pain in feet.  No history of claudication.  No history of non-healing ulcer, No history of DVT   Pulmonary: No home oxygen, no productive cough, no hemoptysis,  No asthma or wheezing  Musculoskeletal:  [ ]  Arthritis, [ ]  Low back pain,  [ ]  Joint pain  Hematologic:No history of hypercoagulable state.  No history of easy bleeding.  No history of anemia  Gastrointestinal: No hematochezia or melena,  No gastroesophageal reflux, no trouble swallowing  Urinary: [x ]  chronic Kidney disease, [ ]  on HD - [ ]  MWF or [ x] TTHS, [ ]  Burning with urination, [ ]  Frequent urination, [ ]  Difficulty urinating;   Skin: No rashes  Psychological: No history of anxiety,  No history of depression   Physical Examination  Filed Vitals:   03/06/16 1108  BP: 127/80  Pulse: 68  Temp: 97.1 F (36.2 C)  TempSrc: Oral  Resp: 14  Height: 5\' 11"  (1.803 m)  Weight: 153 lb (69.4 kg)  SpO2: 98%    Body mass index is 21.35 kg/(m^2).  General:  Alert and oriented, no acute distress HEENT: Normal, left side dialysis catheter Pulmonary: Clear to auscultation bilaterally Cardiac: Regular Rate and Rhythm without murmur Skin: No rash Extremity Pulses:  Absent radial, 2+ brachial pulses bilaterally, occluded fistula left upper arm occluded right upper arm graft right arm Musculoskeletal: No deformity or edema  Neurologic: Upper and lower extremity motor 5/5 and symmetric  ASSESSMENT: Patient with multiple failed accesses. Next option at this point would most likely be a [name]. We will obtain a central venogram of the left side for further evaluation of this.   PLAN:  Left arm central venogram 03/06/2016. Risks benefits possible compilations procedure details were discussed with the patient today he understands and agrees to proceed.  Fabienne Bruns, MD [facility] [place] Office: [phone] Pager: [phone]

## 2016-03-07 ENCOUNTER — Encounter (HOSPITAL_COMMUNITY): Payer: Self-pay | Admitting: Vascular Surgery

## 2016-03-07 ENCOUNTER — Other Ambulatory Visit: Payer: Self-pay

## 2016-03-07 ENCOUNTER — Ambulatory Visit (HOSPITAL_COMMUNITY)
Admission: RE | Admit: 2016-03-07 | Discharge: 2016-03-07 | Disposition: A | Discharge status: Home / Self Care | Payer: Medicare Other | Source: Ambulatory Visit | Attending: Vascular Surgery | Admitting: Vascular Surgery

## 2016-03-07 ENCOUNTER — Encounter (HOSPITAL_COMMUNITY)
Admission: RE | Disposition: A | Discharge status: Home / Self Care | Payer: Self-pay | Source: Ambulatory Visit | Attending: Vascular Surgery

## 2016-03-07 DIAGNOSIS — I120 Hypertensive chronic kidney disease with stage 5 chronic kidney disease or end stage renal disease: Secondary | ICD-10-CM | POA: Diagnosis not present

## 2016-03-07 DIAGNOSIS — F17210 Nicotine dependence, cigarettes, uncomplicated: Secondary | ICD-10-CM | POA: Insufficient documentation

## 2016-03-07 DIAGNOSIS — Z880 Allergy status to penicillin: Secondary | ICD-10-CM | POA: Insufficient documentation

## 2016-03-07 DIAGNOSIS — M1990 Unspecified osteoarthritis, unspecified site: Secondary | ICD-10-CM | POA: Diagnosis not present

## 2016-03-07 DIAGNOSIS — Z992 Dependence on renal dialysis: Secondary | ICD-10-CM | POA: Diagnosis not present

## 2016-03-07 DIAGNOSIS — N184 Chronic kidney disease, stage 4 (severe): Secondary | ICD-10-CM

## 2016-03-07 DIAGNOSIS — N186 End stage renal disease: Secondary | ICD-10-CM | POA: Insufficient documentation

## 2016-03-07 HISTORY — PX: PERIPHERAL VASCULAR CATHETERIZATION: SHX172C

## 2016-03-07 LAB — POCT I-STAT, CHEM 8
BUN: 61 mg/dL — AB (ref 6–20)
CALCIUM ION: 0.84 mmol/L — AB (ref 1.12–1.23)
CHLORIDE: 106 mmol/L (ref 101–111)
CREATININE: 16 mg/dL — AB (ref 0.61–1.24)
Glucose, Bld: 87 mg/dL (ref 65–99)
HEMATOCRIT: 49 % (ref 39.0–52.0)
Hemoglobin: 16.7 g/dL (ref 13.0–17.0)
Potassium: 5.3 mmol/L — ABNORMAL HIGH (ref 3.5–5.1)
Sodium: 140 mmol/L (ref 135–145)
TCO2: 21 mmol/L (ref 0–100)

## 2016-03-07 SURGERY — A/V SHUNTOGRAM/FISTULAGRAM

## 2016-03-07 MED ORDER — IODIXANOL 320 MG/ML IV SOLN
INTRAVENOUS | Status: DC | PRN
  Administered 2016-03-07: 20 mL via INTRAVENOUS

## 2016-03-07 MED ORDER — METHYLPREDNISOLONE SODIUM SUCC 125 MG IJ SOLR
INTRAMUSCULAR | Status: AC
  Administered 2016-03-07: 125 mg via INTRAVENOUS
  Filled 2016-03-07: qty 2

## 2016-03-07 MED ORDER — DOCUSATE SODIUM 100 MG PO CAPS: 100.0000 mg | ORAL_CAPSULE | Freq: Every day | ORAL | Status: DC

## 2016-03-07 MED ORDER — DIPHENHYDRAMINE HCL 50 MG/ML IJ SOLN
25.0000 mg | INTRAMUSCULAR | Status: AC
  Administered 2016-03-07: 25 mg via INTRAVENOUS

## 2016-03-07 MED ORDER — HEPARIN (PORCINE) IN NACL 2-0.9 UNIT/ML-% IJ SOLN
INTRAMUSCULAR | Status: DC | PRN
  Administered 2016-03-07: 500 mL

## 2016-03-07 MED ORDER — LABETALOL HCL 5 MG/ML IV SOLN: 10.0000 mg | INTRAVENOUS | Status: DC | PRN

## 2016-03-07 MED ORDER — METHYLPREDNISOLONE SODIUM SUCC 125 MG IJ SOLR
125.0000 mg | INTRAMUSCULAR | Status: AC
  Administered 2016-03-07: 125 mg via INTRAVENOUS

## 2016-03-07 MED ORDER — DIPHENHYDRAMINE HCL 50 MG/ML IJ SOLN
INTRAMUSCULAR | Status: AC
  Administered 2016-03-07: 25 mg via INTRAVENOUS
  Filled 2016-03-07: qty 1

## 2016-03-07 MED ORDER — FAMOTIDINE IN NACL 20-0.9 MG/50ML-% IV SOLN
INTRAVENOUS | Status: AC
  Administered 2016-03-07: 20 mg via INTRAVENOUS
  Filled 2016-03-07: qty 50

## 2016-03-07 MED ORDER — FAMOTIDINE IN NACL 20-0.9 MG/50ML-% IV SOLN
20.0000 mg | INTRAVENOUS | Status: AC
  Administered 2016-03-07: 20 mg via INTRAVENOUS

## 2016-03-07 MED ORDER — SODIUM CHLORIDE 0.9% FLUSH: 3.0000 mL | INTRAVENOUS | Status: DC | PRN

## 2016-03-07 MED ORDER — ONDANSETRON HCL 4 MG/2ML IJ SOLN: 4.0000 mg | Freq: Four times a day (QID) | INTRAMUSCULAR | Status: DC | PRN

## 2016-03-07 MED ORDER — HYDRALAZINE HCL 20 MG/ML IJ SOLN: 5.0000 mg | INTRAMUSCULAR | Status: DC | PRN

## 2016-03-07 SURGICAL SUPPLY — 2 items
STOPCOCK MORSE 400PSI 3WAY (MISCELLANEOUS) ×2 IMPLANT
TUBING CIL FLEX 10 FLL-RA (TUBING) ×2 IMPLANT

## 2016-03-07 NOTE — Op Note (Signed)
Procedure: Left central venogram  Preoperative diagnosis: End-stage renal disease  Postoperative diagnosis: Same  Anesthesia: None  Operative findings: Open cell stent extending from the distal cephalic vein into the subclavian vein but the subclavian and central veins are patent the  Operative details: After obtaining informed consent, the patient was taken to the PV lab. The patient was placed in supine position the Angio table. The patient previous he had had a left upper extremity peripheral IV placed. This was accessed and contrast injected through this. There are multiple stents noted in the left infraclavicular region. Upon contrast opacification the axillary vein is patent. The left subclavian vein is patent. The stents do extend from the distal cephalic vein into a portion of the subclavian vein. However, this portion of the subclavian vein is patent. The superior vena cava and innominate vein is also patent. The patient experienced some mild pain with injection of the contrast otherwise he tolerated this fine. The patient was taken back to the holding area in stable condition.  Operative management: The patient will be scheduled for a [name] in the near future.  Fabienne Brunsharles Jacqulin Brandenburger, MD [facility] [place] Office: [phone] Pager: [phone]

## 2016-03-07 NOTE — Discharge Instructions (Signed)
Venogram, Care After °Refer to this sheet in the next few weeks. These instructions provide you with information on caring for yourself after your procedure. Your health care provider may also give you more specific instructions. Your treatment has been planned according to current medical practices, but problems sometimes occur. Call your health care provider if you have any problems or questions after your procedure. °WHAT TO EXPECT AFTER THE PROCEDURE °After your procedure, it is typical to have the following sensations: °· Mild discomfort at the catheter insertion site. °HOME CARE INSTRUCTIONS  °· Take all medicines exactly as directed. °· Follow any prescribed diet. °· Follow instructions regarding both rest and physical activity. °· Drink more fluids for the first several days after the procedure in order to help flush dye from your kidneys. °SEEK MEDICAL CARE IF: °· You develop a rash. °· You have fever not controlled by medicine. °SEEK IMMEDIATE MEDICAL CARE IF: °· There is pain, drainage, bleeding, redness, swelling, warmth or a red streak at the site of the IV tube. °· The extremity where your IV tube was placed becomes discolored, numb, or cool. °· You have difficulty breathing or shortness of breath. °· You develop chest pain. °· You have excessive dizziness or fainting. °  °This information is not intended to replace advice given to you by your health care provider. Make sure you discuss any questions you have with your health care provider. °  °Document Released: 07/06/2013 Document Revised: 09/20/2013 Document Reviewed: 07/06/2013 °Elsevier Interactive Patient Education ©2016 [employer] ° °

## 2016-03-07 NOTE — Interval H&P Note (Signed)
History and Physical Interval Note:  03/07/2016 7:54 AM  Garald BaldingJeffrey E Bleecker  has presented today for surgery, with the diagnosis of left central - instage renal  The various methods of treatment have been discussed with the patient and family. After consideration of risks, benefits and other options for treatment, the patient has consented to  Procedure(s): Venogram (N/A) as a surgical intervention .  The patient's history has been reviewed, patient examined, no change in status, stable for surgery.  I have reviewed the patient's chart and labs.  Questions were answered to the patient's satisfaction.     Fabienne BrunsFields, Domnique Vanegas

## 2016-03-07 NOTE — H&P (View-Only) (Signed)
[facility] [place] HISTORY AND PHYSICAL   History of Present Illness:  Patient is a 50 y.o. male who presents for placement of a permanent hemodialysis access. The patient has previously had exploration of the cephalic vein at the left wrist which was inadequate. He had a left brachiocephalic AV fistula which lasted a few years. This is now occluded. Patient previously had a right brachiocephalic AV fistula which was eventually converted to a right upper arm AV graft. These are all occluded as well. The patient currently has a catheter in the left internal jugular vein. His dialysis today is Tuesday Thursday Saturday.  Other chronic medical problems include HIV and hypertension both of which are currently stable..  Past Medical History  Diagnosis Date  . HIV infection (HCC)   . DJD (degenerative joint disease)     right knee  . Tertiary syphilis   . Genital herpes   . Anal condyloma   . Family history of disseminated HSV infection   . Shortness of breath     "can happen at anytime" (03/04/2013)  . ESRD (end stage renal disease) on dialysis (HCC) 01/02/2012    Gets diaysis at [facility] on [employer], TTS schedule.  Started dialysis in 2000.     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 Reclotted on 03/02/13 and IR placed L IJ tunneled HD catheter (a right external jugular tunneled HD cath was attempted first but did not function due to kinking across the clavicle). Pt was seen by [facility] on same admission 6/614 and said RUA AVF no longer usable, they ordered vein mapping and will see him as outpatient to set up next permanent access.     . Hypertension   . Pneumonia     hx of  . Diarrhea     occurs because of dialysis  . Hemodialysis patient [facility])     Tuesday, Thursday, Saturday    Past Surgical History  Procedure Laterality Date  . Av fistula placement, brachiocephalic Right 06/16/11  . Appendectomy     . Tonsillectomy    . Cholecystectomy    . Incision and drainage abscess      abdominal  . Av fistula placement Right 06/16/11  . Revison of arteriovenous fistula Right 04/20/2013    Procedure: INSERTION OF ARTERIOVENOUS GORTEX GRAFT;  Surgeon: Sherren Kerns, MD;  Location: [facility] OR;  Service: Vascular;  Laterality: Right;  Ultrasound Guided     Social History Social History  Substance Use Topics  . Smoking status: Light Tobacco Smoker -- 0.10 packs/day for 27 years    Types: Cigarettes  . Smokeless tobacco: Never Used     Comment: cutting back from last visit  . Alcohol Use: 0.0 oz/week    0 Standard drinks or equivalent per week     Comment: 03/04/2013 "stopped drinking ~ 12 yr ago; never had problem w/it"    Family History Family History  Problem Relation Age of Onset  . Diabetes Mother   . Arthritis Father     Gout- Foot    Allergies  Allergies  Allergen Reactions  . Contrast Media [Iodinated Diagnostic Agents] Hives and Itching  . Acetaminophen-Codeine Nausea And Vomiting  . Penicillins Hives    Has patient had a PCN reaction causing immediate rash, facial/tongue/throat swelling, SOB or lightheadedness with hypotension: Yes Has patient had a PCN reaction causing severe rash involving mucus membranes or skin necrosis:  No Has patient had a PCN reaction that required hospitalization Yes Has patient had a PCN reaction occurring within the last 10 years: No If all of the above answers are "NO", then may proceed with Cephalosporin use.     Current Outpatient Prescriptions  Medication Sig Dispense Refill  . acetaminophen (TYLENOL) 650 MG CR tablet Take 1,300 mg by mouth every 6 (six) hours as needed for pain.    Marland Kitchen. albuterol (PROVENTIL HFA;VENTOLIN HFA) 108 (90 BASE) MCG/ACT inhaler Inhale 1-2 puffs into the lungs every 6 (six) hours as needed for wheezing or shortness of breath. 1 Inhaler 0  . calcium acetate (PHOSLO) 667 MG capsule Take 2,668 mg by mouth See admin  instructions. Take 4 capsules (2668 mg) by mouth with meals - once or twice daily    . Darunavir Ethanolate (PREZISTA) 800 MG tablet Take 1 tablet (800 mg total) by mouth at bedtime. Take with Norvir tablet. 30 tablet 11  . dolutegravir (TIVICAY) 50 MG tablet Take 1 tablet (50 mg total) by mouth daily. 30 tablet 11  . nicotine (NICODERM CQ - DOSED IN MG/24 HR) 7 mg/24hr patch Place 1 patch (7 mg total) onto the skin daily. 28 patch 0  . ritonavir (NORVIR) 100 MG TABS tablet Take 1 tablet (100 mg total) by mouth at bedtime. Take with Prezista tablet. 30 tablet 11  . tenofovir (VIREAD) 300 MG tablet Take 1 tablet (300 mg total) by mouth once a week. On Sundays (Patient taking differently: Take 300 mg by mouth every Sunday. On Sundays) 4 tablet 11  . valACYclovir (VALTREX) 500 MG tablet Take 1 tablet (500 mg total) by mouth daily as needed (herpes outbreaks). 30 tablet 5   No current facility-administered medications for this visit.    ROS:   General:  No weight loss, Fever, chills  HEENT: No recent headaches, no nasal bleeding, no visual changes, no sore throat  Neurologic: No dizziness, blackouts, seizures. No recent symptoms of stroke or mini- stroke. No recent episodes of slurred speech, or temporary blindness.  Cardiac: No recent episodes of chest pain/pressure, no shortness of breath at rest.  No shortness of breath with exertion.  Denies history of atrial fibrillation or irregular heartbeat  Vascular: No history of rest pain in feet.  No history of claudication.  No history of non-healing ulcer, No history of DVT   Pulmonary: No home oxygen, no productive cough, no hemoptysis,  No asthma or wheezing  Musculoskeletal:  [ ]  Arthritis, [ ]  Low back pain,  [ ]  Joint pain  Hematologic:No history of hypercoagulable state.  No history of easy bleeding.  No history of anemia  Gastrointestinal: No hematochezia or melena,  No gastroesophageal reflux, no trouble swallowing  Urinary: [x ]  chronic Kidney disease, [ ]  on HD - [ ]  MWF or [ x] TTHS, [ ]  Burning with urination, [ ]  Frequent urination, [ ]  Difficulty urinating;   Skin: No rashes  Psychological: No history of anxiety,  No history of depression   Physical Examination  Filed Vitals:   03/06/16 1108  BP: 127/80  Pulse: 68  Temp: 97.1 F (36.2 C)  TempSrc: Oral  Resp: 14  Height: 5\' 11"  (1.803 m)  Weight: 153 lb (69.4 kg)  SpO2: 98%    Body mass index is 21.35 kg/(m^2).  General:  Alert and oriented, no acute distress HEENT: Normal, left side dialysis catheter Pulmonary: Clear to auscultation bilaterally Cardiac: Regular Rate and Rhythm without murmur Skin: No rash Extremity Pulses:  Absent radial, 2+ brachial pulses bilaterally, occluded fistula left upper arm occluded right upper arm graft right arm Musculoskeletal: No deformity or edema  Neurologic: Upper and lower extremity motor 5/5 and symmetric  ASSESSMENT: Patient with multiple failed accesses. Next option at this point would most likely be a [name]. We will obtain a central venogram of the left side for further evaluation of this.   PLAN:  Left arm central venogram 03/06/2016. Risks benefits possible compilations procedure details were discussed with the patient today he understands and agrees to proceed.  Fabienne Bruns, MD [facility] [place] Office: [phone] Pager: [phone]

## 2016-03-14 ENCOUNTER — Encounter (HOSPITAL_COMMUNITY): Payer: Self-pay | Admitting: *Deleted

## 2016-03-16 MED ORDER — VANCOMYCIN HCL IN DEXTROSE 1-5 GM/200ML-% IV SOLN
1000.0000 mg | INTRAVENOUS | Status: AC
  Administered 2016-03-17: 1000 mg via INTRAVENOUS
  Filled 2016-03-16: qty 200

## 2016-03-16 MED ORDER — SODIUM CHLORIDE 0.9 % IV SOLN: INTRAVENOUS | Status: DC

## 2016-03-17 ENCOUNTER — Ambulatory Visit (HOSPITAL_COMMUNITY): Payer: Medicare Other | Admitting: Certified Registered Nurse Anesthetist

## 2016-03-17 ENCOUNTER — Encounter (HOSPITAL_COMMUNITY): Payer: Self-pay | Admitting: Certified Registered Nurse Anesthetist

## 2016-03-17 ENCOUNTER — Ambulatory Visit (HOSPITAL_COMMUNITY)
Admission: RE | Admit: 2016-03-17 | Discharge: 2016-03-17 | Disposition: A | Discharge status: Home / Self Care | Payer: Medicare Other | Source: Ambulatory Visit | Attending: Vascular Surgery | Admitting: Vascular Surgery

## 2016-03-17 ENCOUNTER — Encounter (HOSPITAL_COMMUNITY)
Admission: RE | Disposition: A | Discharge status: Home / Self Care | Payer: Self-pay | Source: Ambulatory Visit | Attending: Vascular Surgery

## 2016-03-17 DIAGNOSIS — Z880 Allergy status to penicillin: Secondary | ICD-10-CM | POA: Diagnosis not present

## 2016-03-17 DIAGNOSIS — N186 End stage renal disease: Secondary | ICD-10-CM | POA: Insufficient documentation

## 2016-03-17 DIAGNOSIS — F17210 Nicotine dependence, cigarettes, uncomplicated: Secondary | ICD-10-CM | POA: Diagnosis not present

## 2016-03-17 DIAGNOSIS — M1711 Unilateral primary osteoarthritis, right knee: Secondary | ICD-10-CM | POA: Insufficient documentation

## 2016-03-17 DIAGNOSIS — Z91041 Radiographic dye allergy status: Secondary | ICD-10-CM | POA: Diagnosis not present

## 2016-03-17 DIAGNOSIS — I120 Hypertensive chronic kidney disease with stage 5 chronic kidney disease or end stage renal disease: Secondary | ICD-10-CM | POA: Insufficient documentation

## 2016-03-17 DIAGNOSIS — Z992 Dependence on renal dialysis: Secondary | ICD-10-CM | POA: Diagnosis not present

## 2016-03-17 DIAGNOSIS — N185 Chronic kidney disease, stage 5: Secondary | ICD-10-CM

## 2016-03-17 HISTORY — PX: AV FISTULA PLACEMENT: SHX1204

## 2016-03-17 HISTORY — DX: Personal history of other medical treatment: Z92.89

## 2016-03-17 HISTORY — DX: Anemia, unspecified: D64.9

## 2016-03-17 LAB — POCT I-STAT 4, (NA,K, GLUC, HGB,HCT)
GLUCOSE: 86 mg/dL (ref 65–99)
HEMATOCRIT: 39 % (ref 39.0–52.0)
HEMOGLOBIN: 13.3 g/dL (ref 13.0–17.0)
POTASSIUM: 4.9 mmol/L (ref 3.5–5.1)
SODIUM: 138 mmol/L (ref 135–145)

## 2016-03-17 SURGERY — INSERTION OF ARTERIOVENOUS (AV) GORE-TEX GRAFT ARM
Anesthesia: General | Site: Arm Upper | Laterality: Left

## 2016-03-17 MED ORDER — FENTANYL CITRATE (PF) 250 MCG/5ML IJ SOLN
INTRAMUSCULAR | Status: AC
  Filled 2016-03-17: qty 5

## 2016-03-17 MED ORDER — ONDANSETRON HCL 4 MG/2ML IJ SOLN
INTRAMUSCULAR | Status: AC
  Filled 2016-03-17: qty 8

## 2016-03-17 MED ORDER — SUCCINYLCHOLINE CHLORIDE 200 MG/10ML IV SOSY
PREFILLED_SYRINGE | INTRAVENOUS | Status: AC
  Filled 2016-03-17: qty 10

## 2016-03-17 MED ORDER — EPHEDRINE 5 MG/ML INJ
INTRAVENOUS | Status: AC
  Filled 2016-03-17: qty 60

## 2016-03-17 MED ORDER — LIDOCAINE 2% (20 MG/ML) 5 ML SYRINGE
INTRAMUSCULAR | Status: AC
  Filled 2016-03-17: qty 20

## 2016-03-17 MED ORDER — PROPOFOL 10 MG/ML IV BOLUS
INTRAVENOUS | Status: DC | PRN
  Administered 2016-03-17: 150 mg via INTRAVENOUS
  Administered 2016-03-17: 50 mg via INTRAVENOUS

## 2016-03-17 MED ORDER — VANCOMYCIN HCL IN DEXTROSE 1-5 GM/200ML-% IV SOLN
INTRAVENOUS | Status: AC
  Filled 2016-03-17: qty 200

## 2016-03-17 MED ORDER — THROMBIN 20000 UNITS EX SOLR
CUTANEOUS | Status: AC
  Filled 2016-03-17: qty 20000

## 2016-03-17 MED ORDER — ONDANSETRON HCL 4 MG/2ML IJ SOLN: 4.0000 mg | Freq: Once | INTRAMUSCULAR | Status: DC | PRN

## 2016-03-17 MED ORDER — MIDAZOLAM HCL 2 MG/2ML IJ SOLN
INTRAMUSCULAR | Status: AC
  Filled 2016-03-17: qty 2

## 2016-03-17 MED ORDER — HEPARIN SODIUM (PORCINE) 1000 UNIT/ML IJ SOLN
2000.0000 [IU] | Freq: Once | INTRAMUSCULAR | Status: AC
  Administered 2016-03-17: 2000 [IU] via INTRAVENOUS
  Filled 2016-03-17: qty 2

## 2016-03-17 MED ORDER — PHENYLEPHRINE HCL 10 MG/ML IJ SOLN
10.0000 mg | INTRAMUSCULAR | Status: DC | PRN
  Administered 2016-03-17: 50 ug/min via INTRAVENOUS

## 2016-03-17 MED ORDER — SODIUM CHLORIDE 0.9 % IV SOLN
INTRAVENOUS | Status: DC | PRN
  Administered 2016-03-17: 500 mL

## 2016-03-17 MED ORDER — FENTANYL CITRATE (PF) 100 MCG/2ML IJ SOLN
INTRAMUSCULAR | Status: AC
  Administered 2016-03-17: 25 ug via INTRAVENOUS
  Filled 2016-03-17: qty 2

## 2016-03-17 MED ORDER — PROTAMINE SULFATE 10 MG/ML IV SOLN
INTRAVENOUS | Status: AC
  Filled 2016-03-17: qty 25

## 2016-03-17 MED ORDER — FENTANYL CITRATE (PF) 250 MCG/5ML IJ SOLN
INTRAMUSCULAR | Status: DC | PRN
  Administered 2016-03-17 (×3): 25 ug via INTRAVENOUS
  Administered 2016-03-17: 50 ug via INTRAVENOUS
  Administered 2016-03-17 (×2): 25 ug via INTRAVENOUS
  Administered 2016-03-17: 50 ug via INTRAVENOUS
  Administered 2016-03-17 (×3): 25 ug via INTRAVENOUS
  Administered 2016-03-17: 50 ug via INTRAVENOUS
  Administered 2016-03-17 (×2): 25 ug via INTRAVENOUS

## 2016-03-17 MED ORDER — 0.9 % SODIUM CHLORIDE (POUR BTL) OPTIME
TOPICAL | Status: DC | PRN
  Administered 2016-03-17: 1000 mL

## 2016-03-17 MED ORDER — OXYCODONE HCL 5 MG PO TABS: 5.0000 mg | ORAL_TABLET | Freq: Four times a day (QID) | ORAL | Status: DC | PRN

## 2016-03-17 MED ORDER — PROTAMINE SULFATE 10 MG/ML IV SOLN
INTRAVENOUS | Status: AC
  Filled 2016-03-17: qty 5

## 2016-03-17 MED ORDER — FENTANYL CITRATE (PF) 100 MCG/2ML IJ SOLN
25.0000 ug | INTRAMUSCULAR | Status: DC | PRN
  Administered 2016-03-17 (×3): 25 ug via INTRAVENOUS

## 2016-03-17 MED ORDER — HEPARIN SODIUM (PORCINE) 1000 UNIT/ML IJ SOLN
INTRAMUSCULAR | Status: DC | PRN
  Administered 2016-03-17: 5000 [IU] via INTRAVENOUS

## 2016-03-17 MED ORDER — HEPARIN SODIUM (PORCINE) 1000 UNIT/ML IJ SOLN
INTRAMUSCULAR | Status: AC
  Filled 2016-03-17: qty 1

## 2016-03-17 MED ORDER — CHLORHEXIDINE GLUCONATE CLOTH 2 % EX PADS: 6.0000 | MEDICATED_PAD | Freq: Once | CUTANEOUS | Status: DC

## 2016-03-17 MED ORDER — MIDAZOLAM HCL 2 MG/2ML IJ SOLN
INTRAMUSCULAR | Status: DC | PRN
  Administered 2016-03-17: 1 mg via INTRAVENOUS

## 2016-03-17 MED ORDER — PROTAMINE SULFATE 10 MG/ML IV SOLN
INTRAVENOUS | Status: DC | PRN
  Administered 2016-03-17: 40 mg via INTRAVENOUS
  Administered 2016-03-17: 10 mg via INTRAVENOUS

## 2016-03-17 MED ORDER — LIDOCAINE HCL (PF) 1 % IJ SOLN
INTRAMUSCULAR | Status: AC
  Filled 2016-03-17: qty 30

## 2016-03-17 MED ORDER — ONDANSETRON HCL 4 MG/2ML IJ SOLN
INTRAMUSCULAR | Status: DC | PRN
  Administered 2016-03-17: 4 mg via INTRAVENOUS

## 2016-03-17 MED ORDER — PHENYLEPHRINE 40 MCG/ML (10ML) SYRINGE FOR IV PUSH (FOR BLOOD PRESSURE SUPPORT)
PREFILLED_SYRINGE | INTRAVENOUS | Status: AC
  Filled 2016-03-17: qty 30

## 2016-03-17 MED ORDER — SODIUM CHLORIDE 0.9 % IV SOLN
INTRAVENOUS | Status: DC
  Administered 2016-03-17: 08:00:00 via INTRAVENOUS

## 2016-03-17 SURGICAL SUPPLY — 34 items
ARMBAND PINK RESTRICT EXTREMIT (MISCELLANEOUS) ×2 IMPLANT
CANISTER SUCTION 2500CC (MISCELLANEOUS) ×2 IMPLANT
CANNULA VESSEL 3MM 2 BLNT TIP (CANNULA) ×2 IMPLANT
CLIP TI MEDIUM 6 (CLIP) ×2 IMPLANT
CLIP TI WIDE RED SMALL 6 (CLIP) ×2 IMPLANT
DECANTER SPIKE VIAL GLASS SM (MISCELLANEOUS) ×1 IMPLANT
ELECT REM PT RETURN 9FT ADLT (ELECTROSURGICAL) ×2
ELECTRODE REM PT RTRN 9FT ADLT (ELECTROSURGICAL) ×1 IMPLANT
GEL ULTRASOUND 20GR AQUASONIC (MISCELLANEOUS) IMPLANT
GLOVE BIO SURGEON STRL SZ 6.5 (GLOVE) ×8 IMPLANT
GLOVE BIO SURGEON STRL SZ7.5 (GLOVE) ×2 IMPLANT
GLOVE BIOGEL PI IND STRL 6 (GLOVE) ×1 IMPLANT
GLOVE BIOGEL PI IND STRL 6.5 (GLOVE) IMPLANT
GLOVE BIOGEL PI INDICATOR 6 (GLOVE) ×1
GLOVE BIOGEL PI INDICATOR 6.5 (GLOVE) ×1
GLOVE ECLIPSE 6.5 STRL STRAW (GLOVE) ×2 IMPLANT
GOWN STRL REUS W/ TWL LRG LVL3 (GOWN DISPOSABLE) ×3 IMPLANT
GOWN STRL REUS W/TWL LRG LVL3 (GOWN DISPOSABLE) ×6
GRAFT GORETEX STRT 4-7X45 (Vascular Products) ×2 IMPLANT
KIT BASIN OR (CUSTOM PROCEDURE TRAY) ×2 IMPLANT
KIT ROOM TURNOVER OR (KITS) ×2 IMPLANT
LIQUID BAND (GAUZE/BANDAGES/DRESSINGS) ×4 IMPLANT
LOOP VESSEL MINI RED (MISCELLANEOUS) ×1 IMPLANT
NS IRRIG 1000ML POUR BTL (IV SOLUTION) ×2 IMPLANT
PACK CV ACCESS (CUSTOM PROCEDURE TRAY) ×2 IMPLANT
PAD ARMBOARD 7.5X6 YLW CONV (MISCELLANEOUS) ×4 IMPLANT
SPONGE SURGIFOAM ABS GEL 100 (HEMOSTASIS) IMPLANT
SUT PROLENE 6 0 BV (SUTURE) ×1 IMPLANT
SUT PROLENE 6 0 CC (SUTURE) ×4 IMPLANT
SUT VIC AB 3-0 SH 27 (SUTURE) ×4
SUT VIC AB 3-0 SH 27X BRD (SUTURE) ×2 IMPLANT
SUT VICRYL 4-0 PS2 18IN ABS (SUTURE) ×4 IMPLANT
UNDERPAD 30X30 INCONTINENT (UNDERPADS AND DIAPERS) ×2 IMPLANT
WATER STERILE IRR 1000ML POUR (IV SOLUTION) ×2 IMPLANT

## 2016-03-17 NOTE — H&P (View-Only) (Signed)
[facility] [place] HISTORY AND PHYSICAL   History of Present Illness:  Patient is a 50 y.o. male who presents for placement of a permanent hemodialysis access. The patient has previously had exploration of the cephalic vein at the left wrist which was inadequate. He had a left brachiocephalic AV fistula which lasted a few years. This is now occluded. Patient previously had a right brachiocephalic AV fistula which was eventually converted to a right upper arm AV graft. These are all occluded as well. The patient currently has a catheter in the left internal jugular vein. His dialysis today is Tuesday Thursday Saturday.  Other chronic medical problems include HIV and hypertension both of which are currently stable..  Past Medical History  Diagnosis Date  . HIV infection (HCC)   . DJD (degenerative joint disease)     right knee  . Tertiary syphilis   . Genital herpes   . Anal condyloma   . Family history of disseminated HSV infection   . Shortness of breath     "can happen at anytime" (03/04/2013)  . ESRD (end stage renal disease) on dialysis (HCC) 01/02/2012    Gets diaysis at [facility] on [employer], TTS schedule.  Started dialysis in 2000.     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 Reclotted on 03/02/13 and IR placed L IJ tunneled HD catheter (a right external jugular tunneled HD cath was attempted first but did not function due to kinking across the clavicle). Pt was seen by [facility] on same admission 6/614 and said RUA AVF no longer usable, they ordered vein mapping and will see him as outpatient to set up next permanent access.     . Hypertension   . Pneumonia     hx of  . Diarrhea     occurs because of dialysis  . Hemodialysis patient [facility])     Tuesday, Thursday, Saturday    Past Surgical History  Procedure Laterality Date  . Av fistula placement, brachiocephalic Right 06/16/11  . Appendectomy     . Tonsillectomy    . Cholecystectomy    . Incision and drainage abscess      abdominal  . Av fistula placement Right 06/16/11  . Revison of arteriovenous fistula Right 04/20/2013    Procedure: INSERTION OF ARTERIOVENOUS GORTEX GRAFT;  Surgeon: Sherren Kerns, MD;  Location: [facility] OR;  Service: Vascular;  Laterality: Right;  Ultrasound Guided     Social History Social History  Substance Use Topics  . Smoking status: Light Tobacco Smoker -- 0.10 packs/day for 27 years    Types: Cigarettes  . Smokeless tobacco: Never Used     Comment: cutting back from last visit  . Alcohol Use: 0.0 oz/week    0 Standard drinks or equivalent per week     Comment: 03/04/2013 "stopped drinking ~ 12 yr ago; never had problem w/it"    Family History Family History  Problem Relation Age of Onset  . Diabetes Mother   . Arthritis Father     Gout- Foot    Allergies  Allergies  Allergen Reactions  . Contrast Media [Iodinated Diagnostic Agents] Hives and Itching  . Acetaminophen-Codeine Nausea And Vomiting  . Penicillins Hives    Has patient had a PCN reaction causing immediate rash, facial/tongue/throat swelling, SOB or lightheadedness with hypotension: Yes Has patient had a PCN reaction causing severe rash involving mucus membranes or skin necrosis:  No Has patient had a PCN reaction that required hospitalization Yes Has patient had a PCN reaction occurring within the last 10 years: No If all of the above answers are "NO", then may proceed with Cephalosporin use.     Current Outpatient Prescriptions  Medication Sig Dispense Refill  . acetaminophen (TYLENOL) 650 MG CR tablet Take 1,300 mg by mouth every 6 (six) hours as needed for pain.    Marland Kitchen. albuterol (PROVENTIL HFA;VENTOLIN HFA) 108 (90 BASE) MCG/ACT inhaler Inhale 1-2 puffs into the lungs every 6 (six) hours as needed for wheezing or shortness of breath. 1 Inhaler 0  . calcium acetate (PHOSLO) 667 MG capsule Take 2,668 mg by mouth See admin  instructions. Take 4 capsules (2668 mg) by mouth with meals - once or twice daily    . Darunavir Ethanolate (PREZISTA) 800 MG tablet Take 1 tablet (800 mg total) by mouth at bedtime. Take with Norvir tablet. 30 tablet 11  . dolutegravir (TIVICAY) 50 MG tablet Take 1 tablet (50 mg total) by mouth daily. 30 tablet 11  . nicotine (NICODERM CQ - DOSED IN MG/24 HR) 7 mg/24hr patch Place 1 patch (7 mg total) onto the skin daily. 28 patch 0  . ritonavir (NORVIR) 100 MG TABS tablet Take 1 tablet (100 mg total) by mouth at bedtime. Take with Prezista tablet. 30 tablet 11  . tenofovir (VIREAD) 300 MG tablet Take 1 tablet (300 mg total) by mouth once a week. On Sundays (Patient taking differently: Take 300 mg by mouth every Sunday. On Sundays) 4 tablet 11  . valACYclovir (VALTREX) 500 MG tablet Take 1 tablet (500 mg total) by mouth daily as needed (herpes outbreaks). 30 tablet 5   No current facility-administered medications for this visit.    ROS:   General:  No weight loss, Fever, chills  HEENT: No recent headaches, no nasal bleeding, no visual changes, no sore throat  Neurologic: No dizziness, blackouts, seizures. No recent symptoms of stroke or mini- stroke. No recent episodes of slurred speech, or temporary blindness.  Cardiac: No recent episodes of chest pain/pressure, no shortness of breath at rest.  No shortness of breath with exertion.  Denies history of atrial fibrillation or irregular heartbeat  Vascular: No history of rest pain in feet.  No history of claudication.  No history of non-healing ulcer, No history of DVT   Pulmonary: No home oxygen, no productive cough, no hemoptysis,  No asthma or wheezing  Musculoskeletal:  [ ]  Arthritis, [ ]  Low back pain,  [ ]  Joint pain  Hematologic:No history of hypercoagulable state.  No history of easy bleeding.  No history of anemia  Gastrointestinal: No hematochezia or melena,  No gastroesophageal reflux, no trouble swallowing  Urinary: [x ]  chronic Kidney disease, [ ]  on HD - [ ]  MWF or [ x] TTHS, [ ]  Burning with urination, [ ]  Frequent urination, [ ]  Difficulty urinating;   Skin: No rashes  Psychological: No history of anxiety,  No history of depression   Physical Examination  Filed Vitals:   03/06/16 1108  BP: 127/80  Pulse: 68  Temp: 97.1 F (36.2 C)  TempSrc: Oral  Resp: 14  Height: 5\' 11"  (1.803 m)  Weight: 153 lb (69.4 kg)  SpO2: 98%    Body mass index is 21.35 kg/(m^2).  General:  Alert and oriented, no acute distress HEENT: Normal, left side dialysis catheter Pulmonary: Clear to auscultation bilaterally Cardiac: Regular Rate and Rhythm without murmur Skin: No rash Extremity Pulses:  Absent radial, 2+ brachial pulses bilaterally, occluded fistula left upper arm occluded right upper arm graft right arm Musculoskeletal: No deformity or edema  Neurologic: Upper and lower extremity motor 5/5 and symmetric  ASSESSMENT: Patient with multiple failed accesses. Next option at this point would most likely be a [name]. We will obtain a central venogram of the left side for further evaluation of this.   PLAN:  Left arm central venogram 03/06/2016. Risks benefits possible compilations procedure details were discussed with the patient today he understands and agrees to proceed.  Fabienne Bruns, MD [facility] [place] Office: [phone] Pager: [phone]

## 2016-03-17 NOTE — Anesthesia Postprocedure Evaluation (Signed)
Anesthesia Post Note  Patient: Garald BaldingJeffrey E Fragoso  Procedure(s) Performed: Procedure(s) (LRB): INSERTION OF ARTERIOVENOUS (AV) GORE-TEX GRAFT ARM USING GORETEX 4-7MM X 45CM STRETCH GRAFT (Left)  Patient location during evaluation: PACU Anesthesia Type: General Level of consciousness: awake, awake and alert and oriented Pain management: pain level controlled Vital Signs Assessment: post-procedure vital signs reviewed and stable Respiratory status: spontaneous breathing, nonlabored ventilation and respiratory function stable Cardiovascular status: blood pressure returned to baseline Anesthetic complications: no    Last Vitals:  Filed Vitals:   03/17/16 1300 03/17/16 1311  BP:  91/44  Pulse:  80  Temp: 36.6 C   Resp:      Last Pain:  Filed Vitals:   03/17/16 1323  PainSc: 0-No pain                 Jaonna Word COKER

## 2016-03-17 NOTE — Interval H&P Note (Signed)
History and Physical Interval Note:  03/17/2016 8:56 AM  Caleb BaldingJeffrey E Guay  has presented today for surgery, with the diagnosis of End Stage Renal Disease N18.6  The various methods of treatment have been discussed with the patient and family. After consideration of risks, benefits and other options for treatment, the patient has consented to  Procedure(s): INSERTION OF ARTERIOVENOUS (AV) GORE-TEX GRAFT ARM (Left) as a surgical intervention .  The patient's history has been reviewed, patient examined, no change in status, stable for surgery.  I have reviewed the patient's chart and labs.  Questions were answered to the patient's satisfaction.     Fabienne BrunsFields, Shalah Estelle

## 2016-03-17 NOTE — Discharge Instructions (Signed)
° ° °  03/17/2016 Garald BaldingJeffrey E Crager [id number] 1965-11-25  Surgeon(s): Sherren Kernsharles E Fields, MD  Procedure(s): INSERTION OF ARTERIOVENOUS (AV) GORE-TEX GRAFT ARM USING GORETEX 4-7MM X 45CM STRETCH GRAFT-LEFT UPPER ARM  x Do not stick graft for 4 weeks

## 2016-03-17 NOTE — Progress Notes (Signed)
IV attempt X1 with out success.  Pt has poor vein status.  Dr Noreene LarssonJoslin notified and I was given the OK to access dialysis catheter for IV access.  Venous port accessed per protocol with good blood return.

## 2016-03-17 NOTE — OR Nursing (Signed)
Pt. Has an IV contrast allergy. Pt. Stated using iodine on skin was ok . Pt. Stated iodine has been used recently with no reaction.

## 2016-03-17 NOTE — Anesthesia Procedure Notes (Signed)
Procedure Name: LMA Insertion Date/Time: 03/17/2016 9:43 AM Performed by: Daiva EvesAVENEL, Ritha Sampedro W Pre-anesthesia Checklist: Patient identified, Patient being monitored, Suction available, Timeout performed and Emergency Drugs available Patient Re-evaluated:Patient Re-evaluated prior to inductionOxygen Delivery Method: Circle system utilized Preoxygenation: Pre-oxygenation with 100% oxygen Intubation Type: IV induction LMA: LMA inserted LMA Size: 5.0 Number of attempts: 1 Placement Confirmation: positive ETCO2,  CO2 detector and breath sounds checked- equal and bilateral Tube secured with: Tape Dental Injury: Teeth and Oropharynx as per pre-operative assessment

## 2016-03-17 NOTE — Op Note (Signed)
Procedure: [name]  Preop: ESRD  Postop: ESRD  Anesthesia: General  Findings:4-7 mm PTFE end to side to axillary vein   Procedure Details: The right upper extremity was prepped and draped in usual sterile fashion.  A longitudinal incision was then made near the antecubital crease the left arm.  There were no suitable antecubital veins for outflow.   The incision was carried into the subcutaneous tissues down to level of the brachial artery.  Next the brachial artery was dissected free in the medial portion incision. The artery was  3 mm in diameter. The vessel loops were placed proximal and distal to the planned site of arteriotomy. At this point, a longitudinal incision was made in the axilla and carried through the subcutaneous tissues and fascia to expose the axillary vein.  The nerves were protected.  The vein was approximately 4-5 mm in diameter. Next, a subcutaneous tunnel was created connecting the upper arm to the lower arm incision in an arcing configuration over the biceps muscle.  A 4-7 mm PTFE graft was then brought through this subcutaneous tunnel. The patient was given 5000 units of intravenous heparin. After appropriate circulation time, the vessel loops were used to control the artery. A longitudinal opening was made in the right brachial artery.  The 4 mm end of the graft was beveled and sewn end to side to the artery using a 6 0 prolene.  At completion of the anastomosis the artery was forward bled, backbled and thoroughly flushed.  The anastomosis was secured, vessel loops were released and there was palpable pulse in the graft.  The graft was clamped just above the arterial anastomosis with a fistula clamp. The graft was then pulled taut to length at the axillary incision.  The axillary vein was controlled with a fine bulldog clamp in the upper axilla.  The vein was controlled proximally and distally and the opened longitudinally.  The distal end of the graft was then  beveled and sewn end to side to the vein using a running 6 0 prolene.  Just prior to completion of the anastomosis, everything was forward bled, back bled and thoroughly flushed.  The anastomosis was secured and the fistula clamp removed from the proximal graft.  A thrill was immediately palpable in the graft. The patient was given 50 mg of protamine to assist with hemostasis.  After hemostasis was obtained, the subcutaneous tissues were reapproximated using a running 3-0 Vicryl suture. The skin was then closed with a 4 0 Vicryl subcuticular stitch. Dermabond was applied to the skin incisions.  The patient tolerated the procedure well and there were no complications.  Instrument sponge and needle count was correct at the end of the case.  The patient was taken to the recovery room in stable condition. The patient had audible radial and ulnar doppler signals at the end of the case.  Fabienne Brunsharles Fields, MD [facility] [place] Office: [phone] Pager: [phone]

## 2016-03-17 NOTE — Progress Notes (Signed)
Still waiting on IV team to cap hemodialysis cath. Wife has to leave to go to interview. Wife states she will come back to pick up pt after interview.

## 2016-03-17 NOTE — Transfer of Care (Signed)
Immediate Anesthesia Transfer of Care Note  Patient: Garald BaldingJeffrey E Brandle  Procedure(s) Performed: Procedure(s): INSERTION OF ARTERIOVENOUS (AV) GORE-TEX GRAFT ARM USING GORETEX 4-7MM X 45CM STRETCH GRAFT (Left)  Patient Location: PACU  Anesthesia Type:General  Level of Consciousness: awake, alert , oriented and patient cooperative  Airway & Oxygen Therapy: Patient Spontanous Breathing and Patient connected to face mask oxygen  Post-op Assessment: Report given to RN and Post -op Vital signs reviewed and stable  Post vital signs: Reviewed and stable  Last Vitals:  Filed Vitals:   03/17/16 0710  BP: 95/45  Temp: 36.6 C  Resp: 18    Last Pain: There were no vitals filed for this visit.    Patients Stated Pain Goal: 3 (03/17/16 0743)  Complications: No apparent anesthesia complications

## 2016-03-17 NOTE — Anesthesia Preprocedure Evaluation (Addendum)
Anesthesia Evaluation  Patient identified by MRN, date of birth, ID band Patient awake    Reviewed: Allergy & Precautions, NPO status , Patient's Chart, lab work & pertinent test results  Airway Mallampati: II  TM Distance: >3 FB Neck ROM: Full    Dental  (+) Edentulous Upper, Poor Dentition, Chipped, Dental Advisory Given   Pulmonary Current Smoker,    breath sounds clear to auscultation       Cardiovascular hypertension,  Rhythm:Regular Rate:Normal     Neuro/Psych    GI/Hepatic   Endo/Other    Renal/GU      Musculoskeletal   Abdominal   Peds  Hematology   Anesthesia Other Findings   Reproductive/Obstetrics                           Anesthesia Physical Anesthesia Plan  ASA: III  Anesthesia Plan: General   Post-op Pain Management:    Induction: Intravenous  Airway Management Planned: LMA  Additional Equipment:   Intra-op Plan:   Post-operative Plan:   Informed Consent: I have reviewed the patients History and Physical, chart, labs and discussed the procedure including the risks, benefits and alternatives for the proposed anesthesia with the patient or authorized representative who has indicated his/her understanding and acceptance.   Dental advisory given  Plan Discussed with: CRNA and Anesthesiologist  Anesthesia Plan Comments:        Anesthesia Quick Evaluation

## 2016-03-18 ENCOUNTER — Encounter (HOSPITAL_COMMUNITY): Payer: Self-pay | Admitting: Vascular Surgery

## 2016-08-11 ENCOUNTER — Telehealth: Payer: Self-pay | Admitting: *Deleted

## 2016-08-11 ENCOUNTER — Ambulatory Visit: Payer: Medicare Other | Admitting: Internal Medicine

## 2016-08-11 NOTE — Telephone Encounter (Signed)
Patient forgot the appt.  Made a new appt for 08/19/16 @ 1115.  Pt verbalized the appt back.

## 2016-08-19 ENCOUNTER — Ambulatory Visit (INDEPENDENT_AMBULATORY_CARE_PROVIDER_SITE_OTHER): Payer: Medicare Other | Admitting: Internal Medicine

## 2016-08-19 ENCOUNTER — Encounter: Payer: Self-pay | Admitting: Internal Medicine

## 2016-08-19 VITALS — Temp 97.4°F | Ht 71.0 in | Wt 163.4 lb

## 2016-08-19 DIAGNOSIS — Z79899 Other long term (current) drug therapy: Secondary | ICD-10-CM

## 2016-08-19 DIAGNOSIS — F17200 Nicotine dependence, unspecified, uncomplicated: Secondary | ICD-10-CM | POA: Diagnosis not present

## 2016-08-19 DIAGNOSIS — F321 Major depressive disorder, single episode, moderate: Secondary | ICD-10-CM

## 2016-08-19 DIAGNOSIS — B20 Human immunodeficiency virus [HIV] disease: Secondary | ICD-10-CM

## 2016-08-19 DIAGNOSIS — Z113 Encounter for screening for infections with a predominantly sexual mode of transmission: Secondary | ICD-10-CM

## 2016-08-19 NOTE — Progress Notes (Signed)
Patient Active Problem List   Diagnosis Date Noted  . Depression 10/19/2006    Priority: High  . ESRD (end stage renal disease) on dialysis 01/02/2012    Priority: Medium  . Human immunodeficiency virus (HIV) disease (HCC) 10/19/2006    Priority: Medium  . CIGARETTE SMOKER 10/19/2006    Priority: Medium  . Normocytic anemia 10/14/2015  . Thrombocytopenia (HCC) 10/14/2015  . Gall stone   . Right upper quadrant pain   . Right lower lobe pneumonia (HCC) 10/13/2015  . 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 PERIPHERAL NEUROPATHY 04/10/2009  . BACTEREMIA, MYCOBACTERIUM AVIUM COMPLEX 10/19/2006  . HERPES, GENITAL NEC 10/19/2006  . WARTS, OTHER SPECIFIED VIRAL 10/19/2006  . SYPHILIS NOS 10/19/2006    Patient's Medications  New Prescriptions   No medications on file  Previous Medications   ACETAMINOPHEN (TYLENOL) 650 MG CR TABLET    Take 1,300 mg by mouth every 6 (six) hours as needed for pain.   ALBUTEROL (PROVENTIL HFA;VENTOLIN HFA) 108 (90 BASE) MCG/ACT INHALER    Inhale 1-2 puffs into the lungs every 6 (six) hours as needed for wheezing or shortness of breath.   CALCIUM ACETATE (PHOSLO) 667 MG CAPSULE    Take 2,668 mg by mouth See admin instructions. Take 4 capsules (2668 mg) by mouth with meals - once or twice daily   DARUNAVIR ETHANOLATE (PREZISTA) 800 MG TABLET    Take 1 tablet (800 mg total) by mouth at bedtime. Take with Norvir tablet.   DOLUTEGRAVIR (TIVICAY) 50 MG TABLET    Take 1 tablet (50 mg total) by mouth daily.   GUAIFENESIN (MUCINEX) 600 MG 12 HR TABLET    Take 600 mg by mouth 2 (two) times daily as needed for cough.    OXYCODONE (ROXICODONE) 5 MG IMMEDIATE RELEASE TABLET    Take 1 tablet (5 mg total) by mouth every 6 (six) hours as needed.   RITONAVIR (NORVIR) 100 MG TABS TABLET    Take 1 tablet (100 mg total) by mouth at bedtime. Take with Prezista tablet.   TENOFOVIR (VIREAD) 300 MG TABLET    Take 1 tablet (300 mg total) by mouth once a  week. On Sundays   VALACYCLOVIR (VALTREX) 500 MG TABLET    Take 1 tablet (500 mg total) by mouth daily as needed (herpes outbreaks).  Modified Medications   No medications on file  Discontinued Medications   No medications on file    Subjective: [name] is in for his routine HIV follow-up visit. He has had no problems obtaining, taking or tolerating his HIV regimen consisting of Viread, Tivicay, Prezista and Norvir. He takes them each evening around 7 PM. He continues to smoke cigarettes. He states that he has cut down to about half pack daily. He has not been thinking about quitting however. He has been under more stress than usual recently. 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 He will be starting a new job at [employer] next week. He has been promised certain things by his brother but his brother has not been able to come through and that has frustrated him. He is also concerned that his wife seems very preoccupied with her cell phone and watching TV all day long. He has had difficulty engaging her in conversations. He has been feeling somewhat depressed over the past 2-3 months.  Review of Systems: Review of Systems  Constitutional: Negative for chills, diaphoresis,  fever, malaise/fatigue and weight loss.  HENT: Negative for sore throat.   Respiratory: Negative for cough, sputum production and shortness of breath.   Cardiovascular: Negative for chest pain.  Gastrointestinal: Negative for abdominal pain, diarrhea, heartburn, nausea and vomiting.  Musculoskeletal: Negative for joint pain and myalgias.  Skin: Negative for rash.  Neurological: Negative for dizziness and headaches.  Psychiatric/Behavioral: Positive for depression. Negative for substance abuse and suicidal ideas. The patient has insomnia. The patient is not nervous/anxious.     Past Medical History:  Diagnosis Date  . Anal condyloma   . Anemia   . Diarrhea    occurs because of  dialysis  . DJD (degenerative joint disease)    right knee.  All joints  . ESRD (end stage renal disease) on dialysis (HCC) 01/02/2012   [facility] [address], TTS schedule.  Started dialysis in 2000.     . Family history of disseminated HSV infection   . Genital herpes   . Hemod[facility] patient [facility](HCC)    Tuesday, Thursday, Saturday  . History of blood transfusion   . HIV infection (HCC)   . Hypertension   . Pneumonia 1/20107   hx of  . Shortness of breath    "can happen at anytime" (03/04/2013).  03/14/16- "when I have too much fluid"  . Tertiary syphilis   . 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 Reclotted on 03/02/13 and IR placed L IJ tunneled HD catheter (a right external jugular tunneled HD cath was attempted first but did not function due to kinking across the clavicle). Pt was seen by [facility] on same admission 6/614 and said RUA AVF no longer usable, they ordered vein mapping and will see him as outpatient to set up next permanent access.       Social History  Substance Use Topics  . Smoking status: Heavy Tobacco Smoker    Packs/day: 0.50    Years: 27.00    Types: Cigarettes  . Smokeless tobacco: Never Used     Comment: cutting back from last visit  . Alcohol use No     Comment: 03/04/2013 "stopped drinking ~ 12 yr ago; never had problem w/it".      Family History  Problem Relation Age of Onset  . Diabetes Mother   . Arthritis Father     Gout- Foot    Allergies  Allergen Reactions  . Penicillins Hives and Other (See Comments)    Rx required Hospitalization Has patient had a PCN reaction causing immediate rash, facial/tongue/throat swelling, SOB or lightheadedness with hypotension: Yes Has patient had a PCN reaction causing severe rash involving mucus membranes or skin necrosis: No Has patient had a PCN reaction that required hospitalization Yes Has patient had a PCN reaction occurring  within the last 10 years: No   . Contrast Media [Iodinated Diagnostic Agents] Hives and Itching  . Acetaminophen-Codeine Nausea And Vomiting    Objective:  Vitals:   08/19/16 1121  Temp: 97.4 F (36.3 C)  TempSrc: Oral  Weight: 163 lb 6.4 oz (74.1 kg)  Height: 5\' 11"  (1.803 m)   Body mass index is 22.79 kg/m.  Physical Exam  Constitutional: He is oriented to person, place, and time.  He is talkative and in good spirits.  HENT:  Mouth/Throat: No oropharyngeal exudate.  Eyes: Conjunctivae are normal.  Cardiovascular: Normal rate and regular rhythm.   No murmur heard. Pulmonary/Chest: Effort normal and  breath sounds normal. He has no wheezes. He has no rales.  Abdominal: Soft. He exhibits no mass. There is no tenderness.  Musculoskeletal: Normal range of motion.  Neurological: He is alert and oriented to person, place, and time. Gait normal.  Skin: No rash noted.  Psychiatric: Mood and affect normal.    Lab Results Lab Results  Component Value Date   WBC 12.3 (H) 10/17/2015   HGB 13.3 03/17/2016   HCT 39.0 03/17/2016   MCV 95.1 10/17/2015   PLT 156 10/17/2015    Lab Results  Component Value Date   CREATININE 16.00 (H) 03/07/2016   BUN 61 (H) 03/07/2016   NA 138 03/17/2016   K 4.9 03/17/2016   CL 106 03/07/2016   CO2 26 10/17/2015    Lab Results  Component Value Date   ALT 20 10/16/2015   AST 23 10/16/2015   ALKPHOS 156 (H) 10/16/2015   BILITOT 0.8 10/16/2015    Lab Results  Component Value Date   CHOL 188 07/05/2015   HDL 65 07/05/2015   LDLCALC 112 07/05/2015   TRIG 53 07/05/2015   CHOLHDL 2.9 07/05/2015   HIV 1 RNA Quant (copies/mL)  Date Value  02/05/2016 <20  10/22/2015 30 (H)  07/05/2015 <20   CD4 T Cell Abs (/uL)  Date Value  02/05/2016 1,000  10/22/2015 820  10/15/2015 380 (L)     Problem List Items Addressed This Visit      High   Depression    He has been under more stress recently and is feeling depressed. I talked to him  about managing his response to these stressful situations. I had him meet with our behavioral health counselor, Jenel LucksJodi Herring, today.        Medium   CIGARETTE SMOKER    I encouraged him to consider cutting down and quitting his cigarettes.      Human immunodeficiency virus (HIV) disease (HCC)    His adherence is excellent and his infection has been under very good control. He will get repeat lab work today. He will continue his current regimen and follow-up in 6 months.      Relevant Orders   T-helper cell (CD4)- ([facility] clinic only)   HIV 1 RNA quant-no reflex-bld        Caleb AstersJohn Rhyan Radler, MD [facility] [phone] pager   [phone] cell 08/19/2016, 11:50 AM

## 2016-08-19 NOTE — Assessment & Plan Note (Signed)
His adherence is excellent and his infection has been under very good control. He will get repeat lab work today. He will continue his current regimen and follow-up in 6 months.

## 2016-08-19 NOTE — Assessment & Plan Note (Signed)
I encouraged him to consider cutting down and quitting his cigarettes.

## 2016-08-19 NOTE — Assessment & Plan Note (Addendum)
He has been under more stress recently and is feeling depressed. I talked to him about managing his response to these stressful situations. I had him meet with our behavioral health counselor, Jenel LucksJodi Herring, today.

## 2016-08-20 LAB — T-HELPER CELL (CD4) - (RCID CLINIC ONLY)
CD4 % Helper T Cell: 29 % — ABNORMAL LOW (ref 33–55)
CD4 T Cell Abs: 960 /uL (ref 400–2700)

## 2016-08-22 LAB — HIV-1 RNA QUANT-NO REFLEX-BLD: HIV 1 RNA Quant: <20 copies/mL (ref <20)

## 2016-08-27 ENCOUNTER — Encounter (INDEPENDENT_AMBULATORY_CARE_PROVIDER_SITE_OTHER): Payer: Self-pay | Admitting: Orthopaedic Surgery

## 2016-08-27 ENCOUNTER — Ambulatory Visit (INDEPENDENT_AMBULATORY_CARE_PROVIDER_SITE_OTHER): Payer: Self-pay

## 2016-08-27 ENCOUNTER — Ambulatory Visit (INDEPENDENT_AMBULATORY_CARE_PROVIDER_SITE_OTHER): Payer: Medicare Other | Admitting: Orthopaedic Surgery

## 2016-08-27 VITALS — Ht 71.0 in | Wt 163.0 lb

## 2016-08-27 DIAGNOSIS — M7542 Impingement syndrome of left shoulder: Secondary | ICD-10-CM

## 2016-08-27 DIAGNOSIS — M25512 Pain in left shoulder: Secondary | ICD-10-CM | POA: Diagnosis not present

## 2016-08-27 NOTE — Patient Instructions (Signed)
Must go to physical therapy as prescribed today.

## 2016-08-27 NOTE — Progress Notes (Signed)
Office Visit Note   Patient: [name]           Date of Birth: April 08, 1966           MRN: [id number] Visit Date: 08/27/2016              Requested by: Terrial RhodesJoseph Coladonato, MD [address] [place], [place] [id number] PCP: Irena CordsJoseph A Coladonato, MD   Assessment & Plan: Visit Diagnoses:  1. Left shoulder pain, unspecified chronicity   2. Impingement syndrome of left shoulder     Plan:  Recommend that patient go to formal PT as previously recommended by Dr. August Saucerean and Dr. Berline Choughigby. States that he only had 2 visits and I advised him that this is definitely not enough to see if it will help his current problem. PT prescription given for 2-3 days per week over the next 5-6 weeks. We will follow Dr. Ophelia CharterYates in about 5 weeks for recheck. If he is not showing any signs of improvement we did discuss the possibility of shoulder arthroscopy with debridement and possible rotator cuff repair. I advised him that some of his pain is likely related to the shunt that he had as discussed with Dr. August Saucerean last year. He continues to localize a conservative amount of pain into the mid lateral upper arm.  Follow-Up Instructions: Return in about 5 weeks (around 10/01/2016).   Orders:  Orders Placed This Encounter  Procedures  . XR Shoulder Left   No orders of the defined types were placed in this encounter.     Procedures: No procedures performed   Clinical Data: No additional findings.   Subjective: Chief Complaint  Patient presents with  . Left Shoulder - Pain    Patient is here with continued left shoulder pain. He has seen Dr. Berline Choughigby previously and had shoulder injection. He states that the first one helped, but the last one only last a couple of days. He continues to have decreased range of motion and pain. He uses Advil and a muscle rub. Patient is on dialysis and has graft in left arm.   Continues to complain of pain in the mid upper arm area. Pain in this location can be constant at times. He  does have some pain in the shoulders aggravated with overhead movement and reaching on his back. Pain when he lies on his left side at night. Dr. August Saucerean and Dr. Berline Choughigby had previously recommended formal PT and patient states that he only went to 2 visits. Review of Systems  Constitutional: Negative.   HENT: Negative.   Respiratory: Negative.   Cardiovascular: Negative.   Genitourinary: Negative.   Skin: Negative.   Neurological: Negative.   Psychiatric/Behavioral: Negative.      Objective: Vital Signs: Ht 5\' 11"  (1.803 m)   Wt 163 lb (73.9 kg)   BMI 22.73 kg/m   Physical Exam  Constitutional: He is oriented to person, place, and time. No distress.  HENT:  Head: Normocephalic.  Eyes: Pupils are equal, round, and reactive to light.  Pulmonary/Chest: Effort normal.  Neurological: He is alert and oriented to person, place, and time.    Ortho Exam Left shoulder flexion/abduction to about 100-130. Limitation with internal rotation behind his back. Negative drop arm. He does have some pain with supraspinatus resistance. Mid upper arm is tender along the lateral aspect. Specialty Comments:  No specialty comments available.  Imaging: No results found.   PMFS History: Patient Active Problem List   Diagnosis Date Noted  . Normocytic  anemia 10/14/2015  . Thrombocytopenia (HCC) 10/14/2015  . Gall stone   . Right upper quadrant pain   . Right lower lobe pneumonia (HCC) 10/13/2015  . 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 ESRD (end stage renal disease) on dialysis 01/02/2012  . PERIPHERAL NEUROPATHY 04/10/2009  . BACTEREMIA, MYCOBACTERIUM AVIUM COMPLEX 10/19/2006  . Human immunodeficiency virus (HIV) disease (HCC) 10/19/2006  . HERPES, GENITAL NEC 10/19/2006  . WARTS, OTHER SPECIFIED VIRAL 10/19/2006  . SYPHILIS NOS 10/19/2006  . CIGARETTE SMOKER 10/19/2006  . Depression 10/19/2006   Past Medical History:  Diagnosis Date  . Anal condyloma   . Anemia   .  Diarrhea    occurs because of dialysis  . DJD (degenerative joint disease)    right knee.  All joints  . ESRD (end stage renal disease) on dialysis (HCC) 01/02/2012   Gets diaysis at [facility] on [address], TTS schedule.  Started dialysis in 2000.     . Family history of disseminated HSV infection   . Genital herpes   . Hemodialysis patient [facility])    Tuesday, Thursday, Saturday  . History of blood transfusion   . HIV infection (HCC)   . Hypertension   . Pneumonia 1/20107   hx of  . Shortness of breath    "can happen at anytime" (03/04/2013).  03/14/16- "when I have too much fluid"  . Tertiary syphilis   . 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 Reclotted on 03/02/13 and IR placed L IJ tunneled HD catheter (a right external jugular tunneled HD cath was attempted first but did not function due to kinking across the clavicle). Pt was seen by [facility] on same admission 6/614 and said RUA AVF no longer usable, they ordered vein mapping and will see him as outpatient to set up next permanent access.       Family History  Problem Relation Age of Onset  . Diabetes Mother   . Arthritis Father     Gout- Foot    Past Surgical History:  Procedure Laterality Date  . APPENDECTOMY    . AV FISTULA PLACEMENT Right 06/16/11  . AV FISTULA PLACEMENT Left 03/17/2016   Procedure: INSERTION OF ARTERIOVENOUS (AV) GORE-TEX GRAFT ARM USING GORETEX 4-7MM X 45CM STRETCH GRAFT;  Surgeon: Sherren Kerns, MD;  Location: [facility] OR;  Service: Vascular;  Laterality: Left;  . AV FISTULA PLACEMENT, BRACHIOCEPHALIC Right 06/16/11  . CHOLECYSTECTOMY    . INCISION AND DRAINAGE ABSCESS     abdominal  . PERIPHERAL VASCULAR CATHETERIZATION N/A 03/07/2016   Procedure: Susie Cassette;  Surgeon: Sherren Kerns, MD;  Location: [facility] INVASIVE CV LAB;  Service: Cardiovascular;  Laterality: N/A;  . REVISON OF ARTERIOVENOUS FISTULA Right 04/20/2013   Procedure: INSERTION OF  ARTERIOVENOUS GORTEX GRAFT;  Surgeon: Sherren Kerns, MD;  Location: [facility] OR;  Service: Vascular;  Laterality: Right;  Ultrasound Guided  . TONSILLECTOMY     Social History   Occupational History  . Not on file.   Social History Main Topics  . Smoking status: Heavy Tobacco Smoker    Packs/day: 0.50    Years: 27.00    Types: Cigarettes  . Smokeless tobacco: Never Used     Comment: cutting back from last visit  . Alcohol use No     Comment: 03/04/2013 "stopped drinking ~ 12 yr ago; never had problem w/it".    . Drug use: No  . Sexual activity: Not  Currently    Partners: Female     Comment: declined condoms

## 2016-09-01 ENCOUNTER — Other Ambulatory Visit: Payer: Self-pay | Admitting: Internal Medicine

## 2016-09-01 DIAGNOSIS — A6000 Herpesviral infection of urogenital system, unspecified: Secondary | ICD-10-CM

## 2016-09-01 DIAGNOSIS — B20 Human immunodeficiency virus [HIV] disease: Secondary | ICD-10-CM

## 2016-09-01 MED ORDER — VALACYCLOVIR HCL 500 MG PO TABS: 500.0000 mg | ORAL_TABLET | Freq: Every day | ORAL | 11 refills | Status: DC | PRN

## 2016-09-11 ENCOUNTER — Encounter: Payer: Self-pay | Admitting: Vascular Surgery

## 2016-09-12 ENCOUNTER — Ambulatory Visit (INDEPENDENT_AMBULATORY_CARE_PROVIDER_SITE_OTHER): Payer: Medicare Other | Admitting: Student

## 2016-09-12 ENCOUNTER — Encounter: Payer: Self-pay | Admitting: Student

## 2016-09-12 VITALS — BP 124/64 | Wt 161.8 lb

## 2016-09-12 DIAGNOSIS — K219 Gastro-esophageal reflux disease without esophagitis: Secondary | ICD-10-CM

## 2016-09-12 DIAGNOSIS — F17200 Nicotine dependence, unspecified, uncomplicated: Secondary | ICD-10-CM | POA: Diagnosis not present

## 2016-09-12 DIAGNOSIS — Z0000 Encounter for general adult medical examination without abnormal findings: Secondary | ICD-10-CM

## 2016-09-12 NOTE — Patient Instructions (Signed)
It was great seeing you today! We have addressed the following issues today  1. Throat pain/Abdominal pain: this is likely due to gastroesophageal reflux disease (see below for more on this). I will sent a prescription to your pharmacy. I also recommend dietary changes as below. If this wants improve over the next 3-4 weeks, please come back and see [name].  2.  Smoking: I'm glad that you're thinking about quitting smoking. Please schedule an appointment with a counselor (Dr. Raymondo BandKoval).   3.  General wellness: please come back and see [name] for your annual physical exam.    If we did any lab work today, and the results require attention, either me or my nurse will get in touch with you. If everything is normal, you will get a letter in mail. If you don't hear from [name] in two weeks, please give [name] a call. Otherwise, we look forward to seeing you again at your next visit. If you have any questions or concerns before then, please call the clinic at [phone].   Please bring all your medications to every doctors visit   Sign up for My Chart to have easy access to your labs results, and communication with your Primary care physician.     Please check-out at the front desk before leaving the clinic.    Gastroesophageal Reflux Disease, Adult Introduction Normally, food travels down the esophagus and stays in the stomach to be digested. If a person has gastroesophageal reflux disease (GERD), food and stomach acid move back up into the esophagus. When this happens, the esophagus becomes sore and swollen (inflamed). Over time, GERD can make small holes (ulcers) in the lining of the esophagus. Follow these instructions at home: Diet  Follow a diet as told by your doctor. You may need to avoid foods and drinks such as:  Coffee and tea (with or without caffeine).  Drinks that contain alcohol.  Energy drinks and sports drinks.  Carbonated drinks or sodas.  Chocolate and cocoa.  Peppermint and  mint flavorings.  Garlic and onions.  Horseradish.  Spicy and acidic foods, such as peppers, chili powder, curry powder, vinegar, hot sauces, and BBQ sauce.  Citrus fruit juices and citrus fruits, such as oranges, lemons, and limes.  Tomato-based foods, such as red sauce, chili, salsa, and pizza with red sauce.  Fried and fatty foods, such as donuts, french fries, potato chips, and high-fat dressings.  High-fat meats, such as hot dogs, rib eye steak, sausage, ham, and bacon.  High-fat dairy items, such as whole milk, butter, and cream cheese.  Eat small meals often. Avoid eating large meals.  Avoid drinking large amounts of liquid with your meals.  Avoid eating meals during the 2-3 hours before bedtime.  Avoid lying down right after you eat.  Do not exercise right after you eat. General instructions  Pay attention to any changes in your symptoms.  Take over-the-counter and prescription medicines only as told by your doctor. Do not take aspirin, ibuprofen, or other NSAIDs unless your doctor says it is okay.  Do not use any tobacco products, including cigarettes, chewing tobacco, and e-cigarettes. If you need help quitting, ask your doctor.  Wear loose clothes. Do not wear anything tight around your waist.  Raise (elevate) the head of your bed about 6 inches (15 cm).  Try to lower your stress. If you need help doing this, ask your doctor.  If you are overweight, lose an amount of weight that is healthy for you.  Ask your doctor about a safe weight loss goal.  Keep all follow-up visits as told by your doctor. This is important. Contact a doctor if:  You have new symptoms.  You lose weight and you do not know why it is happening.  You have trouble swallowing, or it hurts to swallow.  You have wheezing or a cough that keeps happening.  Your symptoms do not get better with treatment.  You have a hoarse voice. Get help right away if:  You have pain in your arms,  neck, jaw, teeth, or back.  You feel sweaty, dizzy, or light-headed.  You have chest pain or shortness of breath.  You throw up (vomit) and your throw up looks like blood or coffee grounds.  You pass out (faint).  Your poop (stool) is bloody or black.  You cannot swallow, drink, or eat. This information is not intended to replace advice given to you by your health care provider. Make sure you discuss any questions you have with your health care provider. Document Released: 03/03/2008 Document Revised: 02/21/2016 Document Reviewed: 01/10/2015  2017 Elsevier   Food Choices for Gastroesophageal Reflux Disease, Adult When you have gastroesophageal reflux disease (GERD), the foods you eat and your eating habits are very important. Choosing the right foods can help ease your discomfort. What guidelines do I need to follow?  Choose fruits, vegetables, whole grains, and low-fat dairy products.  Choose low-fat meat, fish, and poultry.  Limit fats such as oils, salad dressings, butter, nuts, and avocado.  Keep a food diary. This helps you identify foods that cause symptoms.  Avoid foods that cause symptoms. These may be different for everyone.  Eat small meals often instead of 3 large meals a day.  Eat your meals slowly, in a place where you are relaxed.  Limit fried foods.  Cook foods using methods other than frying.  Avoid drinking alcohol.  Avoid drinking large amounts of liquids with your meals.  Avoid bending over or lying down until 2-3 hours after eating. What foods are not recommended? These are some foods and drinks that may make your symptoms worse: Vegetables  Tomatoes. Tomato juice. Tomato and spaghetti sauce. Chili peppers. Onion and garlic. Horseradish. Fruits  Oranges, grapefruit, and lemon (fruit and juice). Meats  High-fat meats, fish, and poultry. This includes hot dogs, ribs, ham, sausage, salami, and bacon. Dairy  Whole milk and chocolate milk. Sour  cream. Cream. Butter. Ice cream. Cream cheese. Drinks  Coffee and tea. Bubbly (carbonated) drinks or energy drinks. Condiments  Hot sauce. Barbecue sauce. Sweets/Desserts  Chocolate and cocoa. Donuts. Peppermint and spearmint. Fats and Oils  High-fat foods. This includes [place] fries and potato chips. Other  Vinegar. Strong spices. This includes black pepper, white pepper, red pepper, cayenne, curry powder, cloves, ginger, and chili powder. The items listed above may not be a complete list of foods and drinks to avoid. Contact your dietitian for more information.  This information is not intended to replace advice given to you by your health care provider. Make sure you discuss any questions you have with your health care provider. Document Released: 03/16/2012 Document Revised: 02/21/2016 Document Reviewed: 07/20/2013 Elsevier Interactive Patient Education  2017 [employer]

## 2016-09-12 NOTE — Progress Notes (Signed)
Subjective:    [name] is a 50 y.o. old male here to discuss about throat pain.    HPI  Throat pain: this has been going on for one week. Feels like heart burn. Worse with swallowing and lying. Has been using tums which helps for couple of hours. Reports runny nose and congestion for the last one week. Denies fever, chills, shortness of breath, chest pain, emesis, diarrhea, blood in stool or melanotic stool. Reports abdominal distension, abdominal pain (cramping). Reports drinking about 2 cups of coffee a day. Admits smoking cigarettes but denies drinking alcohol  Smoking: Reports smoking 7 cigarettes a day for the last 30 years. He is concerned about the impact of this on his health.  Concern level 7 on scale of 10. He is interested in quitting smoking. He is interested in talking to a counselor in person. However, he would like to wait until after the holidays.   Review of Systems Per history of present illness History and Problem List: [name] has BACTEREMIA, MYCOBACTERIUM AVIUM COMPLEX; Human immunodeficiency virus (HIV) disease (HCC); HERPES, GENITAL NEC; WARTS, OTHER SPECIFIED VIRAL; SYPHILIS NOS; CIGARETTE SMOKER; Depression; PERIPHERAL NEUROPATHY; Thrombosis of dialysis vascular access; ESRD (end stage renal disease) on dialysis; Right lower lobe pneumonia (HCC); Normocytic anemia; Thrombocytopenia (HCC); Gall stone; Right upper quadrant pain; Gastroesophageal reflux disease; and Routine adult health maintenance on his problem list.  [name]  has a past medical history of Anal condyloma; Anemia; Diarrhea; DJD (degenerative joint disease); ESRD (end stage renal disease) on dialysis (HCC) (01/02/2012); Family history of disseminated HSV infection; Genital herpes; Hemodialysis patient [facility](HCC); History of blood transfusion; HIV infection (HCC); Hypertension; Pneumonia (1/20107); Shortness of breath; Tertiary syphilis; and Thrombosis of dialysis vascular access (HCC) (01/02/2012).  Immunizations  needed: reports receiving his vaccinations at his nephrology clinic.     Objective:    BP 124/64   Wt 161 lb 12.8 oz (73.4 kg)   SpO2 (!) 76%   BMI 22.57 kg/m  Physical Exam GEN: appears well, no apparent distress. Oropharynx: No oral lesion, no tonsillar or pharyngeal lesion, erythema or exudation HEM: negative for cervical lymphadenopathies CVS: RRR, normal s1 and s2, 2/6 SEM (patient has bilateral fistula for hemodialysis), no edema, cap refills < 2 secs RESP: no increased work of breathing, good air movement bilaterally, no rhonchi, crackles or wheeze GI: Bowel sounds present and normal, soft, diffuse tenderness to palpation over his periumbilical area, no guarding, no rebound, no mass GU: no suprapubic or CVA tenderness SKIN: Old midline abdominal surgical scar ENDO: negative thyromegally NEURO: alertr and oiented appropriately, no gross defecits  PSYCH: appropriate mood and affect     Assessment and Plan:     [name] was seen today for .   Problem List Items Addressed This Visit      Digestive   Gastroesophageal reflux disease - Primary    History and exam suggestive for gastroesophageal reflux disease. This is a patient with well-managed HIV infection with most recent CD4 count of 960 about 4 months ago. I have low suspicion for opportunistic infections or candidiasis, herpes or CMV. He is already on Valtrex. He is otherwise well appearing without constitutional symptoms. I have discussed about food choices in GERD and gave him handout. I also sent a prescription for Protonix to his pharmacy. There is no drug-drug interaction between Protonix and his HIV medications. If no improvement with these measures, we may consider referral to gastroenterologist for EGD.       Relevant Medications   pantoprazole (  PROTONIX) 40 MG tablet     Other   CIGARETTE SMOKER    Patient voices reasonable concern about the impact of smoking on his health. Concern level 7/10. He is interested  in quitting smoking. However, he would like to wait until after the holidays. He prefers to speak to someone in person. I recommended scheduling a follow-up appointment with our pharmacist/smoking cessation counselor, [name].       Routine adult health maintenance    Patient reports getting his influenza and Tdap vaccines at the nephrology clinic. We will try to obtain these records I also recommended return to clinic for annual wellness visit          Return in about 3 weeks (around 10/03/2016) for Smoking cessation with [name].  Almon Herculesaye T Charna Neeb, MD

## 2016-09-13 DIAGNOSIS — K219 Gastro-esophageal reflux disease without esophagitis: Secondary | ICD-10-CM | POA: Insufficient documentation

## 2016-09-13 DIAGNOSIS — Z0000 Encounter for general adult medical examination without abnormal findings: Secondary | ICD-10-CM | POA: Insufficient documentation

## 2016-09-13 MED ORDER — PANTOPRAZOLE SODIUM 40 MG PO TBEC: 40.0000 mg | DELAYED_RELEASE_TABLET | Freq: Every day | ORAL | 3 refills | Status: DC

## 2016-09-13 NOTE — Assessment & Plan Note (Signed)
Patient voices reasonable concern about the impact of smoking on his health. Concern level 7/10. He is interested in quitting smoking. However, he would like to wait until after the holidays. He prefers to speak to someone in person. I recommended scheduling a follow-up appointment with our pharmacist/smoking cessation counselor, Dr. Raymondo BandKoval.

## 2016-09-13 NOTE — Assessment & Plan Note (Signed)
History and exam suggestive for gastroesophageal reflux disease. This is a patient with well-managed HIV infection with most recent CD4 count of 960 about 4 months ago. I have low suspicion for opportunistic infections or candidiasis, herpes or CMV. He is already on Valtrex. He is otherwise well appearing without constitutional symptoms. I have discussed about food choices in GERD and gave him handout. I also sent a prescription for Protonix to his pharmacy. There is no drug-drug interaction between Protonix and his HIV medications. If no improvement with these measures, we may consider referral to gastroenterologist for EGD.

## 2016-09-13 NOTE — Assessment & Plan Note (Signed)
Patient reports getting his influenza and Tdap vaccines at the nephrology clinic. We will try to obtain these records I also recommended return to clinic for annual wellness visit

## 2016-09-17 ENCOUNTER — Encounter: Payer: Self-pay | Admitting: Vascular Surgery

## 2016-09-17 ENCOUNTER — Ambulatory Visit: Payer: Medicare Other | Admitting: Vascular Surgery

## 2016-09-17 ENCOUNTER — Telehealth: Payer: Self-pay | Admitting: *Deleted

## 2016-09-17 NOTE — Telephone Encounter (Signed)
Contacted the office and they will be faxing over this record attention Dr. Gonfa. Will update records once they are received. Routing to PCP as an FYI so he can look out for this. Zimmerman Rumple, April D, CMA  

## 2016-09-17 NOTE — Telephone Encounter (Signed)
-----   Message from Almon Herculesaye T Gonfa, MD sent at 09/13/2016 11:29 AM EST ----- Regarding: Immunizations Can we look up about his vaccines online and update his health maintenance. [facility] Thank you! Alwyn Renaye

## 2016-09-18 ENCOUNTER — Telehealth: Payer: Self-pay

## 2016-09-18 NOTE — Telephone Encounter (Signed)
Contacted the office and they will be faxing over this record attention Dr. Alanda SlimGonfa. Will update records once they are received. Routing to PCP as an FYI so he can look out for this. Lamonte SakaiZimmerman Rumple, April D, [place]

## 2016-09-19 ENCOUNTER — Encounter: Payer: Self-pay | Admitting: Vascular Surgery

## 2016-10-01 ENCOUNTER — Ambulatory Visit (INDEPENDENT_AMBULATORY_CARE_PROVIDER_SITE_OTHER): Payer: Medicare Other | Admitting: Orthopaedic Surgery

## 2016-10-02 ENCOUNTER — Encounter: Payer: Self-pay | Admitting: Vascular Surgery

## 2016-10-02 ENCOUNTER — Ambulatory Visit (INDEPENDENT_AMBULATORY_CARE_PROVIDER_SITE_OTHER): Payer: Medicare Other | Admitting: Vascular Surgery

## 2016-10-02 VITALS — BP 110/80 | HR 93 | Temp 94.8°F | Resp 16 | Ht 71.0 in | Wt 161.0 lb

## 2016-10-02 DIAGNOSIS — N186 End stage renal disease: Secondary | ICD-10-CM | POA: Diagnosis not present

## 2016-10-02 DIAGNOSIS — Z992 Dependence on renal dialysis: Secondary | ICD-10-CM | POA: Diagnosis not present

## 2016-10-04 NOTE — Progress Notes (Signed)
[facility] HISTORY AND PHYSICAL    History of Present Illness:  Patient is a 51 y.o. male who presents for placement of a permanent hemodialysis access. The patient has previously had exploration of the cephalic vein at the left wrist which was inadequate. He had a left brachiocephalic AV fistula which lasted a few years. This is now occluded. Patient previously had a right brachiocephalic AV fistula which was eventually converted to a right upper arm AV graft. These are all occluded as well. The patient currently has a [name]. He has multiple stents in the left subclavian vein. Recent shuntogram suggested that the left upper arm graft probably will fail in the near future due to poor outflow. His dialysis today is Tuesday Thursday Saturday.  Other chronic medical problems include HIV and hypertension both of which are currently stable.    Past Medical History:  Diagnosis Date  . Anal condyloma   . Anemia   . Diarrhea    occurs because of dialysis  . DJD (degenerative joint disease)    right knee.  All joints  . ESRD (end stage renal disease) on dialysis (HCC) 01/02/2012   [facility] [address], TTS schedule.  Started dialysis in 2000.     . Family history of disseminated HSV infection   . Genital herpes   . Hemodialysis patient [facility](HCC)    Tuesday, Thursday, Saturday  . History of blood transfusion   . HIV infection (HCC)   . Hypertension   . Pneumonia 1/20107   hx of  . Shortness of breath    "can happen at anytime" (03/04/2013).  03/14/16- "when I have too much fluid"  . Tertiary syphilis   . 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 Reclotted on 03/02/13 and IR placed L IJ tunneled HD catheter (a right external jugular tunneled HD cath was attempted first but did not function due to kinking across the clavicle). Pt was seen by [facility] on same admission 6/614 and  said RUA AVF no longer usable, they ordered vein mapping and will see him as outpatient to set up next permanent access.        Past Surgical History:  Procedure Laterality Date  . APPENDECTOMY    . AV FISTULA PLACEMENT Right 06/16/11  . AV FISTULA PLACEMENT Left 03/17/2016   Procedure: INSERTION OF ARTERIOVENOUS (AV) GORE-TEX GRAFT ARM USING GORETEX 4-7MM X 45CM STRETCH GRAFT;  Surgeon: Sherren Kernsharles E Hedy Garro, MD;  [facility];  Service: Vascular;  Laterality: Left;  . AV FISTULA PLACEMENT, BRACHIOCEPHALIC Right 06/16/11  . CHOLECYSTECTOMY    . INCISION AND DRAINAGE ABSCESS     abdominal  . PERIPHERAL VASCULAR CATHETERIZATION N/A 03/07/2016   Procedure: Susie CassetteVenogram;  Surgeon: Sherren Kernsharles E Chrisma Hurlock, MD;  [facility];  Service: Cardiovascular;  Laterality: N/A;  . REVISON OF ARTERIOVENOUS FISTULA Right 04/20/2013   Procedure: INSERTION OF ARTERIOVENOUS GORTEX GRAFT;  Surgeon: Sherren Kernsharles E Talita Recht, MD;  [facility];  Service: Vascular;  Laterality: Right;  Ultrasound Guided  . TONSILLECTOMY         Social History       Social History  Substance Use Topics  . Smoking status: Light Tobacco Smoker -- 0.10 packs/day for 27 years      Types: Cigarettes  . Smokeless tobacco: Never Used        Comment: cutting back from  last visit  . Alcohol Use: 0.0 oz/week      0 Standard drinks or equivalent per week        Comment: 03/04/2013 "stopped drinking ~ 12 yr ago; never had problem w/it"      Family History       Family History  Problem Relation Age of Onset  . Diabetes Mother    . Arthritis Father        Gout- Foot      Allergies        Allergies  Allergen Reactions  . Contrast Media [Iodinated Diagnostic Agents] Hives and Itching  . Acetaminophen-Codeine Nausea And Vomiting  . Penicillins Hives      Has patient had a PCN reaction causing immediate rash, facial/tongue/throat swelling, SOB or lightheadedness with hypotension: Yes Has patient had a PCN reaction causing severe  rash involving mucus membranes or skin necrosis: No Has patient had a PCN reaction that required hospitalization Yes Has patient had a PCN reaction occurring within the last 10 years: No If all of the above answers are "NO", then may proceed with Cephalosporin use.              Current Outpatient Prescriptions  Medication Sig Dispense Refill  . acetaminophen (TYLENOL) 650 MG CR tablet Take 1,300 mg by mouth every 6 (six) hours as needed for pain.      Marland Kitchen albuterol (PROVENTIL HFA;VENTOLIN HFA) 108 (90 BASE) MCG/ACT inhaler Inhale 1-2 puffs into the lungs every 6 (six) hours as needed for wheezing or shortness of breath. 1 Inhaler 0  . calcium acetate (PHOSLO) 667 MG capsule Take 2,668 mg by mouth See admin instructions. Take 4 capsules (2668 mg) by mouth with meals - once or twice daily      . Darunavir Ethanolate (PREZISTA) 800 MG tablet Take 1 tablet (800 mg total) by mouth at bedtime. Take with Norvir tablet. 30 tablet 11  . dolutegravir (TIVICAY) 50 MG tablet Take 1 tablet (50 mg total) by mouth daily. 30 tablet 11  . nicotine (NICODERM CQ - DOSED IN MG/24 HR) 7 mg/24hr patch Place 1 patch (7 mg total) onto the skin daily. 28 patch 0  . ritonavir (NORVIR) 100 MG TABS tablet Take 1 tablet (100 mg total) by mouth at bedtime. Take with Prezista tablet. 30 tablet 11  . tenofovir (VIREAD) 300 MG tablet Take 1 tablet (300 mg total) by mouth once a week. On Sundays (Patient taking differently: Take 300 mg by mouth every Sunday. On Sundays) 4 tablet 11  . valACYclovir (VALTREX) 500 MG tablet Take 1 tablet (500 mg total) by mouth daily as needed (herpes outbreaks). 30 tablet 5    No current facility-administered medications for this visit.      ROS:    General:  No weight loss, Fever, chills   HEENT: No recent headaches, no nasal bleeding, no visual changes, no sore throat   Neurologic: No dizziness, blackouts, seizures. No recent symptoms of stroke or mini- stroke. No recent episodes of  slurred speech, or temporary blindness.   Cardiac: No recent episodes of chest pain/pressure, no shortness of breath at rest.  No shortness of breath with exertion.  Denies history of atrial fibrillation or irregular heartbeat   Vascular: No history of rest pain in feet.  No history of claudication.  No history of non-healing ulcer, No history of DVT    Pulmonary: No home oxygen, no productive cough, no hemoptysis,  No asthma or wheezing   Musculoskeletal:  [ ]   Arthritis, [ ]  Low back pain,  [ ]  Joint pain   Hematologic:No history of hypercoagulable state.  No history of easy bleeding.  No history of anemia   Gastrointestinal: No hematochezia or melena,  No gastroesophageal reflux, no trouble swallowing   Urinary: [x ] chronic Kidney disease, [ ]  on HD - [ ]  MWF or [ x] TTHS, [ ]  Burning with urination, [ ]  Frequent urination, [ ]  Difficulty urinating;    Skin: No rashes   Psychological: No history of anxiety,  No history of depression     Physical Examination    Vitals:   10/02/16 1038  BP: 110/80  Pulse: 93  Resp: 16  Temp: (!) 94.8 F (34.9 C)  TempSrc: Oral  SpO2: (!) 80%  Weight: 161 lb (73 kg)  Height: 5\' 11"  (1.803 m)     General:  Alert and oriented, no acute distress HEENT: Normal Pulmonary: Clear to auscultation bilaterally Cardiac: Regular Rate and Rhythm without murmur Skin: No rash, no ulcers on the feet  Extremity Pulses:  Absent radial, 2+ brachial pulses bilaterally, occluded fistula left upper arm occluded right upper arm graft right arm,Audible bruit [name], 2+ femoral pulses absent pedal pulses bilaterally Musculoskeletal: No deformity or edema      Neurologic: Upper and lower extremity motor 5/5 and symmetric  Data: I reviewed the patient's recent shuntogram which showed good flow through the graft but this required extensive angioplasty of the venous outflow there are multiple occluded stents and what appears to be most likely the  distal cephalic vein. However, several of these stents extend into the subclavian vein and may be causing some obstruction of flow. He did appear to have filling of the left internal jugular vein and patency of the left innominate vein   ASSESSMENT: Patient with multiple failed accesses. Next option at this point would most likely be a left side hero AV graft. I also offered the patient a possible thigh graft. However, he does not have palpable pedal pulses bilaterally and I discussed with him that he may be at risk of steal symptoms in the legs bilaterally. I also discussed with him the possibility of graft thrombosis every place a hero graft or a thigh graft as well as the possibility of steal in the upper extremity. Other risks including bleeding infection were also discussed.     PLAN:   patient wishes to think about whether or not to proceed with a thigh graft or hero graft. He will call me in the near future if he wishes to have either of these placed.    Fabienne Bruns, MD [facility] Office: [phone] Pager: [phone]

## 2016-10-06 ENCOUNTER — Encounter: Payer: Self-pay | Admitting: Vascular Surgery

## 2016-10-09 ENCOUNTER — Ambulatory Visit (INDEPENDENT_AMBULATORY_CARE_PROVIDER_SITE_OTHER): Payer: Medicare Other | Admitting: Vascular Surgery

## 2016-10-09 ENCOUNTER — Encounter: Payer: Self-pay | Admitting: Vascular Surgery

## 2016-10-09 VITALS — BP 100/70 | HR 82 | Temp 96.5°F | Resp 16 | Ht 71.0 in | Wt 160.0 lb

## 2016-10-09 DIAGNOSIS — N186 End stage renal disease: Secondary | ICD-10-CM

## 2016-10-09 DIAGNOSIS — Z992 Dependence on renal dialysis: Secondary | ICD-10-CM | POA: Diagnosis not present

## 2016-10-09 NOTE — Progress Notes (Signed)
[facility] [place] HISTORY AND PHYSICAL    History of Present Illness:  Patient is a 51 y.o. male who presents for placement of a permanent hemodialysis access. The patient has previously had exploration of the cephalic vein at the left wrist which was inadequate. He had a left brachiocephalic AV fistula which lasted a few years. This is now occluded. Patient previously had a right brachiocephalic AV fistula which was eventually converted to a right upper arm AV graft. These are all occluded as well. The patient currently has a [name]. He has multiple stents in the left subclavian vein. Recent shuntogram suggested that the left upper arm graft probably will fail in the near future due to poor outflow. His dialysis today is Tuesday Thursday Saturday.  His primary complaint currently is left shoulder and lateral deltoid pain. He states he saw Dr. Ophelia Charter recently and was a little bit confused about whether or not he has a rotator cuff problem or something else wrong with his shoulder.   Other chronic medical problems include HIV and hypertension both of which are currently stable.         Past Medical History:  Diagnosis Date  . Anal condyloma    . Anemia    . Diarrhea      occurs because of dialysis  . DJD (degenerative joint disease)      right knee.  All joints  . ESRD (end stage renal disease) on dialysis (HCC) 01/02/2012    Gets diaysis at [facility] on [employer], TTS schedule.  Started dialysis in 2000.     . Family history of disseminated HSV infection    . Genital herpes    . Hemodialysis patient [facility])      Tuesday, Thursday, Saturday  . History of blood transfusion    . HIV infection (HCC)    . Hypertension    . Pneumonia 1/20107    hx of  . Shortness of breath      "can happen at anytime" (03/04/2013).  03/14/16- "when I have too much fluid"  . Tertiary syphilis    . 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 Reclotted on 03/02/13 and IR placed L IJ tunneled HD catheter (a right external jugular tunneled HD cath was attempted first but did not function due to kinking across the clavicle). Pt was seen by [facility]VS on same admission 6/614 and said RUA AVF no longer usable, they ordered vein mapping and will see him as outpatient to set up next permanent access.              Past Surgical History:  Procedure Laterality Date  . APPENDECTOMY      . AV FISTULA PLACEMENT Right 06/16/11  . AV FISTULA PLACEMENT Left 03/17/2016    Procedure: INSERTION OF ARTERIOVENOUS (AV) GORE-TEX GRAFT ARM USING GORETEX 4-7MM X 45CM STRETCH GRAFT;  Surgeon: Sherren Kerns, MD;  Location: [facility] OR;  Service: Vascular;  Laterality: Left;  . AV FISTULA PLACEMENT, BRACHIOCEPHALIC Right 06/16/11  . CHOLECYSTECTOMY      . INCISION AND DRAINAGE ABSCESS        abdominal  . PERIPHERAL VASCULAR CATHETERIZATION N/A 03/07/2016    Procedure: Susie Cassette;  Surgeon: Sherren Kerns, MD;  Location: [facility] INVASIVE CV LAB;  Service: Cardiovascular;  Laterality: N/A;  . REVISON OF ARTERIOVENOUS FISTULA Right 04/20/2013    Procedure: INSERTION OF ARTERIOVENOUS  GORTEX GRAFT;  Surgeon: Sherren Kerns, MD;  Location: [facility] OR;  Service: Vascular;  Laterality: Right;  Ultrasound Guided  . TONSILLECTOMY            Social History            Social History  Substance Use Topics  . Smoking status: Light Tobacco Smoker -- 0.10 packs/day for 27 years      Types: Cigarettes  . Smokeless tobacco: Never Used        Comment: cutting back from last visit  . Alcohol Use: 0.0 oz/week      0 Standard drinks or equivalent per week        Comment: 03/04/2013 "stopped drinking ~ 12 yr ago; never had problem w/it"      Family History            Family History  Problem Relation Age of Onset  . Diabetes Mother    . Arthritis Father        Gout- Foot      Allergies            Allergies  Allergen Reactions  . Contrast Media  [Iodinated Diagnostic Agents] Hives and Itching  . Acetaminophen-Codeine Nausea And Vomiting  . Penicillins Hives      Has patient had a PCN reaction causing immediate rash, facial/tongue/throat swelling, SOB or lightheadedness with hypotension: Yes Has patient had a PCN reaction causing severe rash involving mucus membranes or skin necrosis: No Has patient had a PCN reaction that required hospitalization Yes Has patient had a PCN reaction occurring within the last 10 years: No If all of the above answers are "NO", then may proceed with Cephalosporin use.                   Current Outpatient Prescriptions  Medication Sig Dispense Refill  . acetaminophen (TYLENOL) 650 MG CR tablet Take 1,300 mg by mouth every 6 (six) hours as needed for pain.      Marland Kitchen albuterol (PROVENTIL HFA;VENTOLIN HFA) 108 (90 BASE) MCG/ACT inhaler Inhale 1-2 puffs into the lungs every 6 (six) hours as needed for wheezing or shortness of breath. 1 Inhaler 0  . calcium acetate (PHOSLO) 667 MG capsule Take 2,668 mg by mouth See admin instructions. Take 4 capsules (2668 mg) by mouth with meals - once or twice daily      . Darunavir Ethanolate (PREZISTA) 800 MG tablet Take 1 tablet (800 mg total) by mouth at bedtime. Take with Norvir tablet. 30 tablet 11  . dolutegravir (TIVICAY) 50 MG tablet Take 1 tablet (50 mg total) by mouth daily. 30 tablet 11  . nicotine (NICODERM CQ - DOSED IN MG/24 HR) 7 mg/24hr patch Place 1 patch (7 mg total) onto the skin daily. 28 patch 0  . ritonavir (NORVIR) 100 MG TABS tablet Take 1 tablet (100 mg total) by mouth at bedtime. Take with Prezista tablet. 30 tablet 11  . tenofovir (VIREAD) 300 MG tablet Take 1 tablet (300 mg total) by mouth once a week. On Sundays (Patient taking differently: Take 300 mg by mouth every Sunday. On Sundays) 4 tablet 11  . valACYclovir (VALTREX) 500 MG tablet Take 1 tablet (500 mg total) by mouth daily as needed (herpes outbreaks). 30 tablet 5    No current  facility-administered medications for this visit.      ROS:    General:  No weight loss, Fever, chills   HEENT: No recent headaches, no nasal bleeding, no visual  changes, no sore throat   Neurologic: No dizziness, blackouts, seizures. No recent symptoms of stroke or mini- stroke. No recent episodes of slurred speech, or temporary blindness.   Cardiac: No recent episodes of chest pain/pressure, no shortness of breath at rest.  No shortness of breath with exertion.  Denies history of atrial fibrillation or irregular heartbeat   Vascular: No history of rest pain in feet.  No history of claudication.  No history of non-healing ulcer, No history of DVT    Pulmonary: No home oxygen, no productive cough, no hemoptysis,  No asthma or wheezing   Musculoskeletal:  [ ]  Arthritis, [ ]  Low back pain,  [ ]  Joint pain   Hematologic:No history of hypercoagulable state.  No history of easy bleeding.  No history of anemia   Gastrointestinal: No hematochezia or melena,  No gastroesophageal reflux, no trouble swallowing   Urinary: [x ] chronic Kidney disease, [ ]  on HD - [ ]  MWF or [ x] TTHS, [ ]  Burning with urination, [ ]  Frequent urination, [ ]  Difficulty urinating;    Skin: No rashes   Psychological: No history of anxiety,  No history of depression     Physical Examination     Vitals:   10/09/16 1453  BP: 100/70  Pulse: 82  Resp: 16  Temp: (!) 96.5 F (35.8 C)  TempSrc: Oral  SpO2: 98%  Weight: 160 lb (72.6 kg)  Height: 5\' 11"  (1.803 m)     General:  Alert and oriented, no acute distress HEENT: Normal Pulmonary: Clear to auscultation bilaterally Cardiac: Regular Rate and Rhythm without murmur Skin: No rash, no ulcers on the feet  Extremity Pulses:  Absent radial, 2+ brachial pulses bilaterally, occluded fistula left upper arm occluded right upper arm graft right arm,Audible bruit [name], 2+ femoral pulses absent pedal pulses bilaterally Musculoskeletal: No  deformity or edema      Neurologic: Upper and lower extremity motor 5/5 and symmetric   Data: I reviewed the patient's recent shuntogram which showed good flow through the graft but this required extensive angioplasty of the venous outflow there are multiple occluded stents and what appears to be most likely the distal cephalic vein. However, several of these stents extend into the subclavian vein and may be causing some obstruction of flow. He did appear to have filling of the left internal jugular vein and patency of the left innominate vein   ASSESSMENT: Patient with multiple failed accesses. Next option at this point would most likely be a left side hero AV graft. I also offered the patient a possible thigh graft. However, he does not have palpable pedal pulses bilaterally and I discussed with him that he may be at risk of steal symptoms in the legs bilaterally. I also discussed with him the possibility of graft thrombosis every place a hero graft or a thigh graft as well as the possibility of steal in the upper extremity. Other risks including bleeding infection were also discussed.    At this point he once to wait until the left upper arm graft fails before placing a new access. If the graft fails she would prefer to try a hero graft.  However, he currently is reluctant to proceed with any new access procedures until his shoulder has been addressed.   PLAN:    patient was referred to physical therapy today to see if he can get some improvement in his shoulder. He will follow-up with me in 1 month. If  his graft occludes in the near future and Dr. Juel BurrowLin is unable to open this and we could consider a hero graft.    Fabienne Brunsharles Dae Antonucci, MD [facility] [place] Office: [phone] Pager: [phone]

## 2016-10-24 ENCOUNTER — Telehealth: Payer: Self-pay

## 2016-10-24 NOTE — Telephone Encounter (Signed)
[facility] The pt. Had a declot per Dr. Juel BurrowLin today.  Requested an earlier appt. with Dr. Darrick PennaFields, stating that Dr. Juel BurrowLin is not optimistic the left arm AVG will remain open.  Also stated the pt. May need a dialysis catheter, if the left arm AVG clots off again.  Advised that a Scheduler will move pt's appt. with Dr. Darrick PennaFields, and will notify [facility] of new appt.  Also, will contact pt.  Requested a CD disc of the procedure today; Lyla SonCarrie agreed to bring CD to [facility] office.

## 2016-10-27 ENCOUNTER — Encounter: Payer: Self-pay | Admitting: Vascular Surgery

## 2016-10-30 ENCOUNTER — Ambulatory Visit: Payer: Medicare Other | Admitting: Vascular Surgery

## 2016-11-03 ENCOUNTER — Ambulatory Visit: Payer: Medicare Other

## 2016-11-05 ENCOUNTER — Telehealth: Payer: Self-pay | Admitting: *Deleted

## 2016-11-05 NOTE — Telephone Encounter (Signed)
Patient called stating he is out of meds. He did ADAP which will take about 3 weeks.(the adap will run out after 2 months.) He has Medicare, but he must sign up for part D. He does not qualify for SPAP or [employer]. Con MemosMichelle Evans is explaining this to him. Pharmacy was able to provide him with a one month supply of his meds and he will pick these up today. Wendall MolaJacqueline Cockerham

## 2016-11-05 NOTE — Telephone Encounter (Signed)
After calling the state Marcelino DusterMichelle was able to find out that patient does have part D and she will send over his SPAP application.

## 2016-11-10 ENCOUNTER — Encounter: Payer: Self-pay | Admitting: Internal Medicine

## 2016-11-13 ENCOUNTER — Ambulatory Visit: Payer: Medicare Other | Admitting: Vascular Surgery

## 2016-12-07 ENCOUNTER — Encounter (HOSPITAL_COMMUNITY): Payer: Self-pay | Admitting: Emergency Medicine

## 2016-12-07 ENCOUNTER — Inpatient Hospital Stay (HOSPITAL_COMMUNITY)
Admission: EM | Admit: 2016-12-07 | Discharge: 2016-12-10 | DRG: 377 | Disposition: A | Discharge status: Home / Self Care | Payer: Medicare Other | Attending: Family Medicine | Admitting: Family Medicine

## 2016-12-07 DIAGNOSIS — A529 Late syphilis, unspecified: Secondary | ICD-10-CM | POA: Diagnosis present

## 2016-12-07 DIAGNOSIS — B20 Human immunodeficiency virus [HIV] disease: Secondary | ICD-10-CM | POA: Diagnosis present

## 2016-12-07 DIAGNOSIS — D696 Thrombocytopenia, unspecified: Secondary | ICD-10-CM | POA: Diagnosis present

## 2016-12-07 DIAGNOSIS — K2971 Gastritis, unspecified, with bleeding: Secondary | ICD-10-CM | POA: Diagnosis present

## 2016-12-07 DIAGNOSIS — K219 Gastro-esophageal reflux disease without esophagitis: Secondary | ICD-10-CM | POA: Diagnosis present

## 2016-12-07 DIAGNOSIS — K921 Melena: Secondary | ICD-10-CM | POA: Diagnosis not present

## 2016-12-07 DIAGNOSIS — Z9049 Acquired absence of other specified parts of digestive tract: Secondary | ICD-10-CM

## 2016-12-07 DIAGNOSIS — H18413 Arcus senilis, bilateral: Secondary | ICD-10-CM | POA: Diagnosis present

## 2016-12-07 DIAGNOSIS — Z8711 Personal history of peptic ulcer disease: Secondary | ICD-10-CM

## 2016-12-07 DIAGNOSIS — R42 Dizziness and giddiness: Secondary | ICD-10-CM | POA: Diagnosis present

## 2016-12-07 DIAGNOSIS — L97509 Non-pressure chronic ulcer of other part of unspecified foot with unspecified severity: Secondary | ICD-10-CM

## 2016-12-07 DIAGNOSIS — R945 Abnormal results of liver function studies: Secondary | ICD-10-CM

## 2016-12-07 DIAGNOSIS — I959 Hypotension, unspecified: Secondary | ICD-10-CM | POA: Diagnosis present

## 2016-12-07 DIAGNOSIS — D62 Acute posthemorrhagic anemia: Secondary | ICD-10-CM | POA: Diagnosis present

## 2016-12-07 DIAGNOSIS — N186 End stage renal disease: Secondary | ICD-10-CM | POA: Diagnosis present

## 2016-12-07 DIAGNOSIS — Z8261 Family history of arthritis: Secondary | ICD-10-CM

## 2016-12-07 DIAGNOSIS — G629 Polyneuropathy, unspecified: Secondary | ICD-10-CM | POA: Diagnosis present

## 2016-12-07 DIAGNOSIS — E8889 Other specified metabolic disorders: Secondary | ICD-10-CM | POA: Diagnosis present

## 2016-12-07 DIAGNOSIS — M25511 Pain in right shoulder: Secondary | ICD-10-CM | POA: Diagnosis present

## 2016-12-07 DIAGNOSIS — L97529 Non-pressure chronic ulcer of other part of left foot with unspecified severity: Secondary | ICD-10-CM | POA: Diagnosis not present

## 2016-12-07 DIAGNOSIS — N281 Cyst of kidney, acquired: Secondary | ICD-10-CM | POA: Diagnosis present

## 2016-12-07 DIAGNOSIS — R7989 Other specified abnormal findings of blood chemistry: Secondary | ICD-10-CM

## 2016-12-07 DIAGNOSIS — A6000 Herpesviral infection of urogenital system, unspecified: Secondary | ICD-10-CM | POA: Diagnosis present

## 2016-12-07 DIAGNOSIS — N2581 Secondary hyperparathyroidism of renal origin: Secondary | ICD-10-CM | POA: Diagnosis present

## 2016-12-07 DIAGNOSIS — F418 Other specified anxiety disorders: Secondary | ICD-10-CM | POA: Diagnosis present

## 2016-12-07 DIAGNOSIS — M1711 Unilateral primary osteoarthritis, right knee: Secondary | ICD-10-CM | POA: Diagnosis present

## 2016-12-07 DIAGNOSIS — K2981 Duodenitis with bleeding: Principal | ICD-10-CM | POA: Diagnosis present

## 2016-12-07 DIAGNOSIS — R1013 Epigastric pain: Secondary | ICD-10-CM | POA: Diagnosis not present

## 2016-12-07 DIAGNOSIS — K920 Hematemesis: Secondary | ICD-10-CM | POA: Diagnosis present

## 2016-12-07 DIAGNOSIS — Z885 Allergy status to narcotic agent status: Secondary | ICD-10-CM

## 2016-12-07 DIAGNOSIS — I120 Hypertensive chronic kidney disease with stage 5 chronic kidney disease or end stage renal disease: Secondary | ICD-10-CM | POA: Diagnosis present

## 2016-12-07 DIAGNOSIS — Z79899 Other long term (current) drug therapy: Secondary | ICD-10-CM

## 2016-12-07 DIAGNOSIS — M25512 Pain in left shoulder: Secondary | ICD-10-CM | POA: Diagnosis present

## 2016-12-07 DIAGNOSIS — Z831 Family history of other infectious and parasitic diseases: Secondary | ICD-10-CM

## 2016-12-07 DIAGNOSIS — F17210 Nicotine dependence, cigarettes, uncomplicated: Secondary | ICD-10-CM | POA: Diagnosis present

## 2016-12-07 DIAGNOSIS — Z880 Allergy status to penicillin: Secondary | ICD-10-CM

## 2016-12-07 DIAGNOSIS — K5900 Constipation, unspecified: Secondary | ICD-10-CM | POA: Diagnosis present

## 2016-12-07 DIAGNOSIS — R4182 Altered mental status, unspecified: Secondary | ICD-10-CM | POA: Diagnosis present

## 2016-12-07 DIAGNOSIS — Z992 Dependence on renal dialysis: Secondary | ICD-10-CM

## 2016-12-07 DIAGNOSIS — K922 Gastrointestinal hemorrhage, unspecified: Secondary | ICD-10-CM | POA: Diagnosis present

## 2016-12-07 DIAGNOSIS — Z91041 Radiographic dye allergy status: Secondary | ICD-10-CM

## 2016-12-07 DIAGNOSIS — K8020 Calculus of gallbladder without cholecystitis without obstruction: Secondary | ICD-10-CM | POA: Diagnosis present

## 2016-12-07 DIAGNOSIS — D631 Anemia in chronic kidney disease: Secondary | ICD-10-CM | POA: Diagnosis present

## 2016-12-07 DIAGNOSIS — F329 Major depressive disorder, single episode, unspecified: Secondary | ICD-10-CM | POA: Diagnosis present

## 2016-12-07 DIAGNOSIS — K2980 Duodenitis without bleeding: Secondary | ICD-10-CM | POA: Diagnosis present

## 2016-12-07 LAB — TYPE AND SCREEN
ABO/RH(D): B POS
ANTIBODY SCREEN: NEGATIVE

## 2016-12-07 LAB — COMPREHENSIVE METABOLIC PANEL
ALT: 25 U/L (ref 17–63)
ANION GAP: 18 — AB (ref 5–15)
AST: 20 U/L (ref 15–41)
Albumin: 3 g/dL — ABNORMAL LOW (ref 3.5–5.0)
Alkaline Phosphatase: 261 U/L — ABNORMAL HIGH (ref 38–126)
BUN: 69 mg/dL — ABNORMAL HIGH (ref 6–20)
CALCIUM: 9.1 mg/dL (ref 8.9–10.3)
CO2: 27 mmol/L (ref 22–32)
Chloride: 92 mmol/L — ABNORMAL LOW (ref 101–111)
Creatinine, Ser: 10.39 mg/dL — ABNORMAL HIGH (ref 0.61–1.24)
GFR, EST AFRICAN AMERICAN: 6 mL/min — AB (ref >60)
GFR, EST NON AFRICAN AMERICAN: 5 mL/min — AB (ref >60)
Glucose, Bld: 88 mg/dL (ref 65–99)
Potassium: 4.6 mmol/L (ref 3.5–5.1)
Sodium: 137 mmol/L (ref 135–145)
Total Bilirubin: 0.6 mg/dL (ref 0.3–1.2)
Total Protein: 7.9 g/dL (ref 6.5–8.1)

## 2016-12-07 LAB — CBC WITH DIFFERENTIAL/PLATELET
Basophils Absolute: 0 10*3/uL (ref 0.0–0.1)
Basophils Relative: 0 %
EOS ABS: 0.1 10*3/uL (ref 0.0–0.7)
EOS PCT: 1 %
HCT: 39.6 % (ref 39.0–52.0)
Hemoglobin: 12.8 g/dL — ABNORMAL LOW (ref 13.0–17.0)
LYMPHS ABS: 3.7 10*3/uL (ref 0.7–4.0)
Lymphocytes Relative: 31 %
MCH: 31.5 pg (ref 26.0–34.0)
MCHC: 32.3 g/dL (ref 30.0–36.0)
MCV: 97.5 fL (ref 78.0–100.0)
MONOS PCT: 11 %
Monocytes Absolute: 1.3 10*3/uL — ABNORMAL HIGH (ref 0.1–1.0)
NEUTROS PCT: 57 %
Neutro Abs: 6.9 10*3/uL (ref 1.7–7.7)
PLATELETS: 145 10*3/uL — AB (ref 150–400)
RBC: 4.06 MIL/uL — ABNORMAL LOW (ref 4.22–5.81)
RDW: 14.9 % (ref 11.5–15.5)
WBC: 12 10*3/uL — ABNORMAL HIGH (ref 4.0–10.5)

## 2016-12-07 LAB — POC OCCULT BLOOD, ED: Fecal Occult Bld: POSITIVE — AB

## 2016-12-07 LAB — PROTIME-INR
INR: 1.07
Prothrombin Time: 13.9 seconds (ref 11.4–15.2)

## 2016-12-07 LAB — LIPASE, BLOOD: LIPASE: 13 U/L (ref 11–51)

## 2016-12-07 LAB — CBG MONITORING, ED: GLUCOSE-CAPILLARY: 67 mg/dL (ref 65–99)

## 2016-12-07 LAB — I-STAT CG4 LACTIC ACID, ED: Lactic Acid, Venous: 1.79 mmol/L (ref 0.5–1.9)

## 2016-12-07 LAB — APTT: APTT: 31 s (ref 24–36)

## 2016-12-07 MED ORDER — PANTOPRAZOLE SODIUM 40 MG IV SOLR
40.0000 mg | Freq: Once | INTRAVENOUS | Status: AC
  Administered 2016-12-07: 40 mg via INTRAVENOUS
  Filled 2016-12-07: qty 40

## 2016-12-07 MED ORDER — ONDANSETRON HCL 4 MG/2ML IJ SOLN
4.0000 mg | Freq: Once | INTRAMUSCULAR | Status: AC
  Administered 2016-12-07: 4 mg via INTRAVENOUS
  Filled 2016-12-07: qty 2

## 2016-12-07 MED ORDER — SODIUM CHLORIDE 0.9 % IV SOLN
8.0000 mg/h | INTRAVENOUS | Status: DC
  Administered 2016-12-07 – 2016-12-09 (×4): 8 mg/h via INTRAVENOUS
  Filled 2016-12-07 (×8): qty 80

## 2016-12-07 NOTE — ED Provider Notes (Signed)
[place] DEPT Provider Note   CSN: [id number] Arrival date & time: 12/07/16  2055     History   Chief Complaint Chief Complaint  Patient presents with  . Hypotension  . Abdominal Pain    HPI [name] is a 51 y.o. male with a past medical history of HIV, syphilis, end-stage renal disease on dialysis and dialyzes Monday, Wednesday and Friday, last dialysis yesterday and did not complete full treatment. Patient states that he went in with his 72.4 kg and left at the scene the week. He did not complete because he states that they were "hitting a nerve" with the catheter, and he was noted to be hypotensive. The patient presents today via EMS with complaint of melanotic diarrhea and hematemesis. Patient states that he began having black, tarry loose stools and had several episodes today. He denies abdominal pain. Just prior to calling EMS. He had an episode of vomiting black coffee ground emesis. He states he has never had an episode like this before. He denies a history of liver disease. He is not on any current blood thinner. He does have a history of previous ulcers and takes medication for reflux, but he states "does not work well." He has some intermittent epigastric abdominal pain but denies constant pain or postprandial pain. The patient does acknowledge lightheadedness and weakness. He has a previous history of alcohol abuse but denies any current alcohol use.   HPI  Past Medical History:  Diagnosis Date  . Anal condyloma   . Anemia   . Diarrhea    occurs because of dialysis  . DJD (degenerative joint disease)    right knee.  All joints  . ESRD (end stage renal disease) on dialysis ([place]) 01/02/2012   Gets diaysis at [facility] on [employer], TTS schedule.  Started dialysis in 2000.     . Family history of disseminated HSV infection   . Genital herpes   . Hemodialysis patient [facility])    Tuesday, Thursday, Saturday  . History of blood transfusion   . HIV  infection ([place])   . Hypertension   . Pneumonia 1/20107   hx of  . Shortness of breath    "can happen at anytime" (03/04/2013).  03/14/16- "when I have too much fluid"  . Tertiary syphilis   . Thrombosis of dialysis vascular access ([place]) 01/02/2012   RUA AV fistula clotted on 02/27/13, declotted by IR. Reclotted on 03/02/13 and IR placed L IJ tunneled HD catheter (a right external jugular tunneled HD cath was attempted first but did not function due to kinking across the clavicle). Pt was seen by [facility] on same admission 6/614 and said RUA AVF no longer usable, they ordered vein mapping and will see him as outpatient to set up next permanent access.       Patient Active Problem List   Diagnosis Date Noted  . Gastroesophageal reflux disease 09/13/2016  . Routine adult health maintenance 09/13/2016  . Normocytic anemia 10/14/2015  . Thrombocytopenia ([place]) 10/14/2015  . Gall stone   . Right upper quadrant pain   . Right lower lobe pneumonia ([place]) 10/13/2015  . 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 ESRD (end stage renal disease) on dialysis 01/02/2012  . PERIPHERAL NEUROPATHY 04/10/2009  . BACTEREMIA, MYCOBACTERIUM AVIUM COMPLEX 10/19/2006  . Human immunodeficiency virus (HIV) disease ([place]) 10/19/2006  . HERPES, GENITAL NEC 10/19/2006  . WARTS, OTHER SPECIFIED VIRAL 10/19/2006  . SYPHILIS NOS 10/19/2006  . CIGARETTE SMOKER 10/19/2006  .  Depression 10/19/2006    Past Surgical History:  Procedure Laterality Date  . APPENDECTOMY    . AV FISTULA PLACEMENT Right 06/16/11  . AV FISTULA PLACEMENT Left 03/17/2016   Procedure: INSERTION OF ARTERIOVENOUS (AV) GORE-TEX GRAFT ARM USING GORETEX 4-7MM X 45CM STRETCH GRAFT;  Surgeon: Elam Dutch, MD;  Location: [place];  Service: Vascular;  Laterality: Left;  . AV FISTULA PLACEMENT, BRACHIOCEPHALIC Right 92/42/68  . CHOLECYSTECTOMY    . INCISION AND DRAINAGE ABSCESS     abdominal  . PERIPHERAL VASCULAR CATHETERIZATION N/A 03/07/2016    Procedure: Annell Greening;  Surgeon: Elam Dutch, MD;  Location: [place] CV LAB;  Service: Cardiovascular;  Laterality: N/A;  . REVISON OF ARTERIOVENOUS FISTULA Right 04/20/2013   Procedure: INSERTION OF ARTERIOVENOUS GORTEX GRAFT;  Surgeon: Elam Dutch, MD;  Location: [place];  Service: Vascular;  Laterality: Right;  Ultrasound Guided  . TONSILLECTOMY         Home Medications    Prior to Admission medications   Medication Sig Start Date End Date Taking? Authorizing Provider  acetaminophen (TYLENOL) 500 MG tablet Take 1,000 mg by mouth every 6 (six) hours as needed for headache (pain).   Yes Historical Provider, MD  albuterol (PROVENTIL HFA;VENTOLIN HFA) 108 (90 Base) MCG/ACT inhaler Inhale 2 puffs into the lungs every 6 (six) hours as needed for wheezing or shortness of breath.   Yes Historical Provider, MD  calcium acetate (PHOSLO) 667 MG capsule Take 2,668 mg by mouth 2 (two) times daily with a meal.    Yes Historical Provider, MD  dolutegravir (TIVICAY) 50 MG tablet Take 1 tablet (50 mg total) by mouth daily. Patient taking differently: Take 50 mg by mouth at bedtime.  10/22/15  Yes Michel Bickers, MD  NORVIR 100 MG TABS tablet TAKE 1 TABLET BY MOUTH AT BEDTIME. TAKE WITH PREZISTA TABLET 09/01/16  Yes Michel Bickers, MD  pantoprazole (PROTONIX) 40 MG tablet Take 1 tablet (40 mg total) by mouth daily. 09/13/16  Yes Mercy Riding, MD  PREZISTA 800 MG tablet TAKE 1 TABLET BY MOUTH AT BEDTIME WITH NORVIR 09/01/16  Yes Michel Bickers, MD  valACYclovir (VALTREX) 500 MG tablet Take 1 tablet (500 mg total) by mouth daily as needed (herpes outbreaks). 09/01/16  Yes Michel Bickers, MD  VIREAD 300 MG tablet TAKE 1 TABLET BY MOUTH ONCE A WEEK ON SUNDAYS Patient taking differently: TAKE 1 TABLET BY MOUTH ONCE A WEEK ON SUNDAYS AT BEDTIME 09/01/16  Yes Michel Bickers, MD    Family History Family History  Problem Relation Age of Onset  . Diabetes Mother   . Arthritis Father     Gout- Foot    Social  History Social History  Substance Use Topics  . Smoking status: Heavy Tobacco Smoker    Packs/day: 0.50    Years: 27.00    Types: Cigarettes  . Smokeless tobacco: Never Used     Comment: cutting back from last visit  . Alcohol use No     Comment: 03/04/2013 "stopped drinking ~ 12 yr ago; never had problem w/it".       Allergies   Penicillins; Contrast media [iodinated diagnostic agents]; and Acetaminophen-codeine   Review of Systems Review of Systems Ten systems reviewed and are negative for acute change, except as noted in the HPI.    Physical Exam Updated Vital Signs BP (!) 109/54   Pulse 84   Temp 98 F (36.7 C) (Oral)   Resp 14   Ht 6' (1.829 m)  Wt 74.8 kg   SpO2 99%   BMI 22.38 kg/m   Physical Exam  Constitutional: He appears well-developed and well-nourished. No distress.  Appears chronically ill and older than stated age. Graft in the right upper extremity with palpable thrill  HENT:  Head: Normocephalic and atraumatic.  Eyes: Conjunctivae and EOM are normal. Pupils are equal, round, and reactive to light. No scleral icterus.  Arcus senilis  Neck: Normal range of motion. Neck supple.  Cardiovascular: Normal rate, regular rhythm and normal heart sounds.   Pulmonary/Chest: Effort normal and breath sounds normal. No respiratory distress.  Abdominal: Soft. There is no tenderness.  Genitourinary:  Genitourinary Comments: Digital Rectal Exam reveals sphincter with good tone. No external hemorrhoids. No masses or fissures. Stool color is melanotic.  Musculoskeletal: He exhibits no edema.  Neurological: He is alert.  Skin: Skin is warm and dry. He is not diaphoretic.  Psychiatric: His behavior is normal.  Nursing note and vitals reviewed.    ED Treatments / Results  Labs (all labs ordered are listed, but only abnormal results are displayed) Labs Reviewed  CBC WITH DIFFERENTIAL/PLATELET - Abnormal; Notable for the following:       Result Value   WBC 12.0  (*)    RBC 4.06 (*)    Hemoglobin 12.8 (*)    Platelets 145 (*)    Monocytes Absolute 1.3 (*)    All other components within normal limits  COMPREHENSIVE METABOLIC PANEL - Abnormal; Notable for the following:    Chloride 92 (*)    BUN 69 (*)    Creatinine, Ser 10.39 (*)    Albumin 3.0 (*)    Alkaline Phosphatase 261 (*)    GFR calc non Af Amer 5 (*)    GFR calc Af Amer 6 (*)    Anion gap 18 (*)    All other components within normal limits  POC OCCULT BLOOD, ED - Abnormal; Notable for the following:    Fecal Occult Bld POSITIVE (*)    All other components within normal limits  PROTIME-INR  APTT  LIPASE, BLOOD  CBG MONITORING, ED  I-STAT CG4 LACTIC ACID, ED  TYPE AND SCREEN    EKG  EKG Interpretation None       Radiology No results found.  Procedures Procedures (including critical care time)  Medications Ordered in ED Medications  pantoprazole (PROTONIX) 80 mg in sodium chloride 0.9 % 250 mL (0.32 mg/mL) infusion (8 mg/hr Intravenous New Bag/Given 12/07/16 2219)  pantoprazole (PROTONIX) injection 40 mg (40 mg Intravenous Given 12/07/16 2219)  ondansetron (ZOFRAN) injection 4 mg (4 mg Intravenous Given 12/07/16 2219)     Initial Impression / Assessment and Plan / ED Course  I have reviewed the triage vital signs and the nursing notes.  Pertinent labs & imaging results that were available during my care of the patient were reviewed by me and considered in my medical decision making (see chart for details).     Patient will be admitted for GI bleed and hypotension. His hgb is stable and he does not currently need transfusion.  Final Clinical Impressions(s) / ED Diagnoses   Final diagnoses:  Gastrointestinal hemorrhage with hematemesis  Melena    New Prescriptions New Prescriptions   No medications on file     Margarita Mail, Hershal Coria 12/07/16 2335    Virgel Manifold, MD 12/16/16 1423

## 2016-12-07 NOTE — ED Notes (Signed)
ED Provider at bedside. 

## 2016-12-07 NOTE — H&P (Signed)
[facility] Admission History and Physical Service Pager: [phone]  Patient name: [name] Medical record number: [id number] Date of birth: [birth year] Age: 51 y.o. Gender: male  Primary Care Provider: Mercy Riding, MD Consultants: GI Code Status: Full  Chief Complaint: Black vomit and stools  Assessment and Plan: [name] is a 51 y.o. male presenting with coffee ground emesis and melena, likely upper GI bleed. 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  Coffee Ground Emesis / Melena / Abdominal Pain: Likely 2/2 to upper GI bleed. Pt has a history of gastric/duodenal ulcers, abdominal abscess, and has had both an appendectomy and cholecystectomy. Differentials include gastritis/PUD (tobacco use contributing and Pt has a hx of ulcers), gastrointestinal angiodysplasia (causes 20-30% of upper and lower GI bleeding in ESRD patients), malignancy. Variceal bleeding unlikely, as Pt has not drank alcohol since 2014. He is also at risk of other causes upper GI bleeding due his HIV disease, including mucosal ulcerations 2/2 CMV and HSV, MAC; although these are less likely with most recent CD4 count of 960 and viral load <20. LFTs normal except for elevated alkaline phosphatase. Lipase normal. On exam, Pt has moderate to severe tenderness to light palpation primarily over the epigastric area and RLQ. He also has mild guarding and mildly positive heel tap sign. No rebound. Hgb 12.8 on admission (BL 12-13). - Admit to telemetry under inpatient status, attending Dr. Mingo Amber. - GI consult in the morning - Continue Protonix gtt - Will order acute abdominal series to look for free air that may indicate a perforated ulcer as a cause of his severe abdominal pain. - Check GGT - Zofran prn for nausea - Recheck CBC in the morning  Acute Encephalopathy: Patient oriented  x 3 but has a difficult time staying awake. Wife states that his AMS started around the time of his vomiting. Could be related to his acute illness. No falls or head trauma recently. Could also be due to uremia in the setting of ESRD. LFTs normal, so hepatic encephalopathy less likely. Infection is also a possibility, given patient's elevated WBC to 12.0, but he is afebrile with normal HR and is not showing any other signs of infection. Stroke is also a possibility, but neuro exam unreliable due to lack of patient cooperation. Pt noted to have generalized 4/5 weakness in all extremities, blunted but equal reflexes, no focal deficits. Cardiac etiology is also a possibility, but EKG obtained in the ED is unremarkable. - Obtain CT head - Multiple labs ordered- ethanol level, ammonia, TSH, folate, B12, troponin - Unable to obtain UDS because patient doesn't void  Hypotension: BPs in the 865H-846N systolics. Appears to be baseline for patient. - Continue to monitor  HIV: Followed by Dr. Michel Bickers. Last CD44 count 960 and viral load <20 in 07/2016. - Continue home Norvir 142m qhs, Prezista 8029mqhs, Tivicay 5079maily, Viread 300m66maken once a week on Sundays- given on admission 3/11) - Recheck CD4 count and viral load  ESRD on HD: ESRD 2/2 ADPCKD. Receives HD on T/Th/Sat. Last received HD on 3/10, but did not get anything taken off, per wife. - Continue home Phoslo 667mg39m - Consult Nephrology if patient will still be admitted on Tuesday.  HSV-2:  - Holding home Valtrex, as Pt denies active outbreak  Chronic Bilateral Shoulder Pain: - Tylenol prn - K pad  Tobacco use: Smokes  1 pack every 2-3 days. - Nicotine patch prn  FEN/GI: NPO due to AMS Prophylaxis: SCDs with active GI bleed  Disposition: Anticipate discharge home in 3-4 days pending clinical improvement and stabilization of his Hgb.  History of Present Illness:  [name] is a 51 y.o. male presenting with black  vomit and stool. He started feeling dizzy today when he was headed to [place] to look for work. He had to reschedule and spent all day in bed. He noticed his left shoulder was hurting, so he went to [place] with his wife to get arthritis pills. He was too tired to walk around [place], so he went out to the car and fell asleep. His wife says "he constantly kept sleeping". When they got home, he wouldn't eat anything and went right back to sleep. He then woke up at 6-7pm and he vomited x 1. His wife noticed that the vomit was black. He then went to the bathroom and threw up a 2nd time. His wife then called EMS. No other vomiting other than today. He has been having malodorous flatulence, worsening heartburn, and belching over the last 3 days. He has been having burning epigastric pain that feels like "heartburn". He is also having "sharp" periumbilical abdominal pain that started today. The abdominal pain is better when he sits up and worse when he lays flat and sneezes. He had some constipation last week, but he had 5-10 black bowel movements today. No hematochezia. He took three 236m tablets of Advil, but he normally only takes Advil a couple times per year.  Review Of Systems: Per HPI with the following additions: see below  Review of Systems  Constitutional: Negative for chills and fever.  HENT: Positive for sore throat. Negative for congestion.   Eyes: Negative for blurred vision and double vision.  Respiratory: Positive for shortness of breath. Negative for cough.   Cardiovascular: Negative for chest pain and leg swelling.  Gastrointestinal: Positive for abdominal pain, heartburn, melena, nausea and vomiting.  Musculoskeletal: Positive for joint pain. Negative for falls.  Skin: Negative for rash.  Neurological: Positive for dizziness. Negative for headaches.    Patient Active Problem List   Diagnosis Date Noted  . Gastroesophageal reflux disease 09/13/2016  . Routine adult health  maintenance 09/13/2016  . Normocytic anemia 10/14/2015  . Thrombocytopenia ([facility] 10/14/2015  . Gall stone   . Right upper quadrant pain   . Right lower lobe pneumonia ([place] 10/13/2015  . 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 ESRD (end stage renal disease) on dialysis 01/02/2012  . PERIPHERAL NEUROPATHY 04/10/2009  . BACTEREMIA, MYCOBACTERIUM AVIUM COMPLEX 10/19/2006  . Human immunodeficiency virus (HIV) disease ([facility] 10/19/2006  . HERPES, GENITAL NEC 10/19/2006  . WARTS, OTHER SPECIFIED VIRAL 10/19/2006  . SYPHILIS NOS 10/19/2006  . CIGARETTE SMOKER 10/19/2006  . Depression 10/19/2006    Past Medical History: Past Medical History:  Diagnosis Date  . Anal condyloma   . Anemia   . Diarrhea    occurs because of dialysis  . DJD (degenerative joint disease)    right knee.  All joints  . ESRD (end stage renal disease) on dialysis ([place] 01/02/2012   [facility].  Started dialysis in 2000.     . Family history of disseminated HSV infection   . Genital herpes   . Hemod[facility] patient ([facility]    Tuesday, Thursday, Saturday  . History of blood transfusion   . HIV  infection ([place])   . Hypertension   . Pneumonia 1/20107   hx of  . Shortness of breath    "can happen at anytime" (03/04/2013).  03/14/16- "when I have too much fluid"  . Tertiary syphilis   . Thrombosis of dialysis vascular access ([place]) 01/02/2012   RUA AV fistula clotted on 02/27/13, declotted by IR. Reclotted on 03/02/13 and IR placed L IJ tunneled HD catheter (a right external jugular tunneled HD cath was attempted first but did not function due to kinking across the clavicle). Pt was seen by [facility] on same admission 6/614 and said RUA AVF no longer usable, they ordered vein mapping and will see him as outpatient to set up next permanent access.       Past Surgical History: Past Surgical History:  Procedure Laterality Date  . APPENDECTOMY    . AV FISTULA PLACEMENT Right  06/16/11  . AV FISTULA PLACEMENT Left 03/17/2016   Procedure: INSERTION OF ARTERIOVENOUS (AV) GORE-TEX GRAFT ARM USING GORETEX 4-7MM X 45CM STRETCH GRAFT;  Surgeon: Elam Dutch, MD;  Location: [facility];  Service: Vascular;  Laterality: Left;  . AV FISTULA PLACEMENT, BRACHIOCEPHALIC Right 85/90/93  . CHOLECYSTECTOMY    . INCISION AND DRAINAGE ABSCESS     abdominal  . PERIPHERAL VASCULAR CATHETERIZATION N/A 03/07/2016   Procedure: Annell Greening;  Surgeon: Elam Dutch, MD;  Location: [facility] CV [facility];  Service: Cardiovascular;  Laterality: N/A;  . REVISON OF ARTERIOVENOUS FISTULA Right 04/20/2013   Procedure: INSERTION OF ARTERIOVENOUS GORTEX GRAFT;  Surgeon: Elam Dutch, MD;  Location: [place] [facility];  Service: Vascular;  Laterality: Right;  Ultrasound Guided  . TONSILLECTOMY      Social History: Social History  Substance Use Topics  . Smoking status: Heavy Tobacco Smoker    Packs/day: 0.50    Years: 27.00    Types: Cigarettes  . Smokeless tobacco: Never Used     Comment: cutting back from last visit  . Alcohol use No     Comment: 03/04/2013 "stopped drinking ~ 12 yr ago; never had problem w/it".     Additional social history: Lives at home with wife. Takes complete care of himself. Please also refer to relevant sections of EMR.  Family History: Family History  Problem Relation Age of Onset  . Diabetes Mother   . Arthritis Father     Gout- Foot    Allergies and Medications: Allergies  Allergen Reactions  . Penicillins Hives and Other (See Comments)    Rx required Hospitalization Has patient had a PCN reaction causing immediate rash, facial/tongue/throat swelling, SOB or lightheadedness with hypotension: Yes Has patient had a PCN reaction causing severe rash involving mucus membranes or skin necrosis: No Has patient had a PCN reaction that required hospitalization Yes Has patient had a PCN reaction occurring within the last 10 years: No   . Contrast Media [I[employer]] Hives and Itching  . Acetaminophen-Codeine Nausea And Vomiting   No current facility-administered medications on file prior to encounter.    Current Outpatient Prescriptions on File Prior to Encounter  Medication Sig Dispense Refill  . calcium acetate (PHOSLO) 667 MG capsule Take 2,668 mg by mouth 2 (two) times daily with a meal.     . dolutegravir (TIVICAY) 50 MG tablet Take 1 tablet (50 mg total) by mouth daily. (Patient taking differently: Take 50 mg by mouth at bedtime. ) 30 tablet 11  . NORVIR 100 MG TABS tablet TAKE 1 TABLET BY MOUTH AT BEDTIME. TAKE  WITH PREZISTA TABLET 30 tablet 11  . pantoprazole (PROTONIX) 40 MG tablet Take 1 tablet (40 mg total) by mouth daily. 30 tablet 3  . PREZISTA 800 MG tablet TAKE 1 TABLET BY MOUTH AT BEDTIME WITH NORVIR 90 tablet 3  . valACYclovir (VALTREX) 500 MG tablet Take 1 tablet (500 mg total) by mouth daily as needed (herpes outbreaks). 30 tablet 11  . VIREAD 300 MG tablet TAKE 1 TABLET BY MOUTH ONCE A WEEK ON SUNDAYS (Patient taking differently: TAKE 1 TABLET BY MOUTH ONCE A WEEK ON SUNDAYS AT BEDTIME) 4 tablet 11    Objective: BP (!) 109/54   Pulse 84   Temp 98 F (36.7 C) (Oral)   Resp 14   Ht 6' (1.829 m)   Wt 165 lb (74.8 kg)   SpO2 99%   BMI 22.38 kg/m  Exam: General: Tired-appearing, easily arousable but continuously falls asleep during exam Eyes: Arcus senilis, EOMI, PERRLA ENTM: Oropharynx clear, mildly dry mucous membranes Neck: Supple, no cervical lymphadenopathy Cardiovascular: RRR, II/VI systolic murmur heard loudest in the LLSB Respiratory: CTAB, no wheezing or crackles Gastrointestinal: +BS, moderate to severe diffuse tenderness to palpation but most notable in the RLQ and epigastric area, no rebound, +mild guarding, + mild heel tap sign, large vertical well-healed scar extending from the umbilicus to the pubic area MSK: No edema, warm and well-perfused, LUE AV graft present Derm: No rashes or lesions on exposed  skin Neuro: Easily arousable but continues to fall asleep, oriented x 3, CN 2-12 intact, 4/5 weakness in all extremities. Patient unable to participate in finger-to-nose testing secondary to bilateral shoulder pain.  Labs and Imaging: CBC BMET   Recent Labs Lab 12/07/16 2120  WBC 12.0*  HGB 12.8*  HCT 39.6  PLT 145*    Recent Labs Lab 12/07/16 2120  NA 137  K 4.6  CL 92*  CO2 27  BUN 69*  CREATININE 10.39*  GLUCOSE 88  CALCIUM 9.1      Sela Hua, MD 12/07/2016, 10:50 PM PGY-2, [place] Intern pager: [phone], text pages welcome

## 2016-12-07 NOTE — ED Triage Notes (Signed)
Pt BIB EMS from home for abdominal pain and hypotension. Pt reports n/v, black stool, and black coffee like emesis x 2 since this AM. Pt dialysis pt, last received dialysis yesterday; concerned that he "didn't get full tx." resp e/u; A&O x 4; BP 98/29 on arrival.

## 2016-12-08 ENCOUNTER — Inpatient Hospital Stay (HOSPITAL_COMMUNITY): Payer: Medicare Other

## 2016-12-08 ENCOUNTER — Encounter (HOSPITAL_COMMUNITY): Payer: Self-pay | Admitting: *Deleted

## 2016-12-08 DIAGNOSIS — K922 Gastrointestinal hemorrhage, unspecified: Secondary | ICD-10-CM

## 2016-12-08 DIAGNOSIS — B20 Human immunodeficiency virus [HIV] disease: Secondary | ICD-10-CM

## 2016-12-08 DIAGNOSIS — K921 Melena: Secondary | ICD-10-CM

## 2016-12-08 DIAGNOSIS — R1013 Epigastric pain: Secondary | ICD-10-CM

## 2016-12-08 DIAGNOSIS — K920 Hematemesis: Secondary | ICD-10-CM

## 2016-12-08 LAB — CBC
HCT: 34 % — ABNORMAL LOW (ref 39.0–52.0)
Hemoglobin: 11.2 g/dL — ABNORMAL LOW (ref 13.0–17.0)
MCH: 31.9 pg (ref 26.0–34.0)
MCHC: 32.9 g/dL (ref 30.0–36.0)
MCV: 96.9 fL (ref 78.0–100.0)
Platelets: 122 10*3/uL — ABNORMAL LOW (ref 150–400)
RBC: 3.51 MIL/uL — ABNORMAL LOW (ref 4.22–5.81)
RDW: 15.4 % (ref 11.5–15.5)
WBC: 13.7 10*3/uL — ABNORMAL HIGH (ref 4.0–10.5)

## 2016-12-08 LAB — GAMMA GT: GGT: 156 U/L — ABNORMAL HIGH (ref 7–50)

## 2016-12-08 LAB — BASIC METABOLIC PANEL
Anion gap: 18 — ABNORMAL HIGH (ref 5–15)
BUN: 84 mg/dL — ABNORMAL HIGH (ref 6–20)
CO2: 21 mmol/L — ABNORMAL LOW (ref 22–32)
Calcium: 8.7 mg/dL — ABNORMAL LOW (ref 8.9–10.3)
Chloride: 97 mmol/L — ABNORMAL LOW (ref 101–111)
Creatinine, Ser: 11.15 mg/dL — ABNORMAL HIGH (ref 0.61–1.24)
GFR calc Af Amer: 5 mL/min — ABNORMAL LOW (ref >60)
GFR calc non Af Amer: 5 mL/min — ABNORMAL LOW (ref >60)
Glucose, Bld: 78 mg/dL (ref 65–99)
Potassium: 4.9 mmol/L (ref 3.5–5.1)
Sodium: 136 mmol/L (ref 135–145)

## 2016-12-08 LAB — T-HELPER CELLS (CD4) COUNT (NOT AT ARMC)
CD4 % Helper T Cell: 26 % — ABNORMAL LOW (ref 33–55)
CD4 T Cell Abs: 990 /uL (ref 400–2700)

## 2016-12-08 LAB — AMMONIA: Ammonia: 52 umol/L — ABNORMAL HIGH (ref 9–35)

## 2016-12-08 LAB — IRON AND TIBC
IRON: 38 ug/dL — AB (ref 45–182)
SATURATION RATIOS: 16 % — AB (ref 17.9–39.5)
TIBC: 242 ug/dL — AB (ref 250–450)
UIBC: 204 ug/dL

## 2016-12-08 LAB — ETHANOL

## 2016-12-08 LAB — FERRITIN: Ferritin: 87 ng/mL (ref 24–336)

## 2016-12-08 LAB — TSH: TSH: 1.291 u[IU]/mL (ref 0.350–4.500)

## 2016-12-08 LAB — VITAMIN B12: Vitamin B-12: 395 pg/mL (ref 180–914)

## 2016-12-08 LAB — TROPONIN I: Troponin I: <0.03 ng/mL (ref <0.03)

## 2016-12-08 MED ORDER — NICOTINE 14 MG/24HR TD PT24: 14.0000 mg | MEDICATED_PATCH | Freq: Every day | TRANSDERMAL | Status: DC | PRN

## 2016-12-08 MED ORDER — SODIUM CHLORIDE 0.9% FLUSH
3.0000 mL | Freq: Two times a day (BID) | INTRAVENOUS | Status: DC
  Administered 2016-12-09: 3 mL via INTRAVENOUS

## 2016-12-08 MED ORDER — ACETAMINOPHEN 325 MG PO TABS
650.0000 mg | ORAL_TABLET | Freq: Four times a day (QID) | ORAL | Status: DC | PRN
  Administered 2016-12-08 – 2016-12-09 (×5): 650 mg via ORAL
  Filled 2016-12-08 (×5): qty 2

## 2016-12-08 MED ORDER — ONDANSETRON HCL 4 MG PO TABS: 4.0000 mg | ORAL_TABLET | Freq: Four times a day (QID) | ORAL | Status: DC | PRN

## 2016-12-08 MED ORDER — TENOFOVIR DISOPROXIL FUMARATE 300 MG PO TABS
300.0000 mg | ORAL_TABLET | ORAL | Status: DC
  Administered 2016-12-08: 300 mg via ORAL
  Filled 2016-12-08: qty 1

## 2016-12-08 MED ORDER — ACETAMINOPHEN 650 MG RE SUPP: 650.0000 mg | Freq: Four times a day (QID) | RECTAL | Status: DC | PRN

## 2016-12-08 MED ORDER — RITONAVIR 100 MG PO TABS
100.0000 mg | ORAL_TABLET | Freq: Every day | ORAL | Status: DC
  Administered 2016-12-08 – 2016-12-09 (×2): 100 mg via ORAL
  Filled 2016-12-08 (×2): qty 1

## 2016-12-08 MED ORDER — ONDANSETRON HCL 4 MG/2ML IJ SOLN: 4.0000 mg | Freq: Four times a day (QID) | INTRAMUSCULAR | Status: DC | PRN

## 2016-12-08 MED ORDER — CALCIUM ACETATE (PHOS BINDER) 667 MG PO CAPS
2668.0000 mg | ORAL_CAPSULE | Freq: Two times a day (BID) | ORAL | Status: DC
  Administered 2016-12-08 – 2016-12-10 (×3): 2668 mg via ORAL
  Filled 2016-12-08 (×4): qty 4

## 2016-12-08 MED ORDER — DARUNAVIR ETHANOLATE 800 MG PO TABS
800.0000 mg | ORAL_TABLET | Freq: Every day | ORAL | Status: DC
  Administered 2016-12-08 – 2016-12-09 (×2): 800 mg via ORAL
  Filled 2016-12-08 (×2): qty 1

## 2016-12-08 MED ORDER — DOLUTEGRAVIR SODIUM 50 MG PO TABS
50.0000 mg | ORAL_TABLET | Freq: Every day | ORAL | Status: DC
  Administered 2016-12-08 – 2016-12-09 (×2): 50 mg via ORAL
  Filled 2016-12-08 (×2): qty 1

## 2016-12-08 MED ORDER — ALBUTEROL SULFATE (2.5 MG/3ML) 0.083% IN NEBU: 2.5000 mg | INHALATION_SOLUTION | Freq: Four times a day (QID) | RESPIRATORY_TRACT | Status: DC | PRN

## 2016-12-08 NOTE — Progress Notes (Signed)
Family Medicine Teaching Service Daily Progress Note Intern Pager: [phone]  Patient name: Caleb BaldingJeffrey E Stineman Medical record number: [id number] Date of birth: June 03, 1966 Age: 51 y.o. Gender: male  Primary Care Provider: Almon Herculesaye T Gonfa, MD Consultants: Gastroenterology Code Status: Full code  Pt Overview and Major Events to Date:  [name] is a 51 y.o. male presenting with coffee ground emesis and melena, likely upper GI bleed. 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  Coffee Ground Emesis / Melena / Abdominal Pain: Epigastric, RUQ and RLQ pain, but worse in epigastrium. Additionally, with pos FOBT and report of coffee ground emesis. This is likely 2/2 to upper GI bleed. History of gastric/duodenal ulcers in 2009, abdominal abscess, and has had both an appendectomy and cholecystectomy.  Hgb 12.8 on admission, down to 11.2 this morning. Alkaline phosphatase elevated to 261 and GGT to 156.  - continue tele - GI consulted; will perform EGD tomorrow, NPO at midnight.  - Continue Protonix gtt - Check GGT - Zofran prn for nausea - RUQ US - hepatitis panel - AM CBC  Acute Encephalopathy: AAOx3 without signs of infection. Appears somnolent this morning. Leading differential is uremia 2/2 ESRD. BUN elevated to 84 this morning.  ETOH <5, ammonia pending, TSH and B12 normal and folate pending.  - Unable to obtain UDS because patient doesn't void - continue to monitor neuro status - HD tomorrow  Hypotension: BPs in the 100s-120s systolics. Appears to be baseline for patient. - Continue to monitor  HIV: Followed by Dr. Cliffton AstersJohn Campbell. CD4 990 and viral load pending - Continue home Norvir 100mg  qhs, Prezista 800mg  qhs, Tivicay 50mg  daily, Viread 300mg  (taken once a week on Sundays- given on admission 3/11) - f/u viral load  ESRD on HD: ESRD 2/2 ADPCKD. Receives HD on T/Th/Sat. Last HD  was on 3/10, but did not have much taken off- per wife.  - Continue home Phoslo 667mg  bid - HD tomorrow  HSV-2:  - Holding home Valtrex, as Pt denies active outbreak  Chronic Bilateral Shoulder Pain: - Tylenol prn - K pad  Tobacco use: Smokes 1 pack every 2-3 days. - Nicotine patch prn  FEN/GI: NPO due to AMS Prophylaxis: SCDs with active GI bleed  Disposition: Anticipate discharge home in 3-4 days pending clinical improvement and stabilization of his Hgb. Subjective:  Tired this morning and complaining of abdominal pain without nausea vomiting or diarrhea.   Objective: Temp:  [97.5 F (36.4 C)-98 F (36.7 C)] 97.5 F (36.4 C) (03/12 0202) Pulse Rate:  [84-95] 95 (03/12 0202) Resp:  [11-21] 18 (03/12 0202) BP: (91-123)/(27-77) 91/45 (03/12 0202) SpO2:  [99 %-100 %] 100 % (03/12 0202) Weight:  [156 lb 1.6 oz (70.8 kg)-165 lb (74.8 kg)] 156 lb 1.6 oz (70.8 kg) (03/12 0202) Physical Exam: General: 51 year old male sitting up in bed appearing uncomfortable but in no acute distress Cardiovascular: Regular rate and rhythm, no murmurs rubs or gallops Respiratory: No increased work of breathing, clear to auscultation bilaterally, no wheezing or rhonchi Abdomen: Soft, moderately tender epigastrium, right upper quadrant without Murphy's sign and right lower quadrant without peritoneal signs Extremities: Warm and well-perfused, no edema Psych: Appears somnolent, but with normal mood and affect  Laboratory:  Recent Labs Lab 12/07/16 2120 12/08/16 0554  WBC 12.0* 13.7*  HGB 12.8* 11.2*  HCT 39.6 34.0*  PLT 145* 122*    Recent Labs Lab 12/07/16 2120  12/08/16 0554  NA 137 136  K 4.6 4.9  CL 92* 97*  CO2 27 21*  BUN 69* 84*  CREATININE 10.39* 11.15*  CALCIUM 9.1 8.7*  PROT 7.9  --   BILITOT 0.6  --   ALKPHOS 261*  --   ALT 25  --   AST 20  --   GLUCOSE 88 78    Imaging/Diagnostic Tests: Ct Head Wo Contrast  Result Date: 12/08/2016 CLINICAL DATA:  Altered  mental status. History of hypertension and HIV. EXAM: CT HEAD WITHOUT CONTRAST TECHNIQUE: Contiguous axial images were obtained from the base of the skull through the vertex without intravenous contrast. COMPARISON:  None. FINDINGS: Brain: Diffuse cerebral atrophy. No ventricular dilatation. Low-attenuation changes in the deep white matter consistent with small vessel ischemic change or other demyelinating process. Areas of encephalomalacia in the occipital lobes bilaterally may indicate old infarcts. Could also consider possibility of posterior reversible encephalopathy syndrome. Parenchymal calcification may indicate dystrophic change or old infection. Meningeal calcifications on the falx and tentorium also could indicate postinflammatory change. No mass-effect or midline shift. Gray-white matter junctions are distinct. Basal cisterns are not effaced. No acute intracranial hemorrhage. Vascular: Vascular calcifications are present. Skull: Calvarium appears intact. Sinuses/Orbits: Mucosal thickening in the paranasal sinuses. No acute air-fluid levels. Prominent aeration of mastoid air cells, extending into the temporal bones. No mastoid effusions. Other: None. IMPRESSION: Diffuse cerebral atrophy. Parenchyma wall and meningeal calcifications could represent dystrophic change or old inflammatory process. Low-attenuation changes in the occipital lobes could represent old infarcts or possibly posterior reversible encephalopathy syndrome. Consider MRI if clinically indicated. Electronically Signed   By: Burman Nieves M.D.   On: 12/08/2016 05:59   Dg Abd Acute W/chest  Result Date: 12/08/2016 CLINICAL DATA:  Initial evaluation for acute epigastric abdominal pain. EXAM: DG ABDOMEN ACUTE W/ 1V CHEST COMPARISON:  Prior radiograph from 11/08/2015. FINDINGS: Cardiac and mediastinal silhouettes are within normal limits. Lungs are mildly hypoinflated. Minimal patchy densities overlying the lung bases on frontal view the  chest do not persists on view of the upper abdomen, and are likely related to bronchovascular crowding. No focal infiltrates. No pulmonary edema or pleural effusion. No pneumothorax. Vascular stents overlie the left subclavian and right axillary region. Bowel gas pattern within normal limits without evidence for obstruction or ileus. No abnormal bowel wall thickening. No free air. No soft tissue mass. Probable calcified gallstone noted within the right upper quadrant. Extensive atherosclerosis noted. No acute osseous abnormality. IMPRESSION: 1. Nonobstructive bowel gas pattern with no radiographic evidence for acute intra-abdominal process. 2. No active cardiopulmonary disease. 3. Cholelithiasis. 4. Extensive atherosclerosis. Electronically Signed   By: Rise Mu M.D.   On: 12/08/2016 01:57    Renne Musca, MD 12/08/2016, 7:31 AM PGY-1, [place] [facility]PT[facility] Intern pager: [phone], text pages welcome

## 2016-12-08 NOTE — Consult Note (Addendum)
Referring Provider:  Dr. Nancy Marus Primary Care Physician:  Almon Hercules, MD Primary Gastroenterologist:  Gentry Fitz  Reason for Consultation:  GI bleed  HPI: [name] is a 51 y.o. male with past medical history of end-stage renal disease on hemodialysis admitted to the hospital with abdominal pain and coffee-ground emesis along with melena. GI is consulted for further evaluation.  Patient seen and examined. According to patient, he was not feeling good since last 1 week and started feeling dizziness on Saturday. Sunday morning patient started noticing black colored stool had multiple bowel movements. She was followed by coffee-ground emesis. Patient was brought in to the hospital for further evaluation. She was complaining of epigastric discomfort which is improving.  Denied any bowel movement since admission. Nausea vomiting is resolved. Denied any fever or chills. Denied dysphagia or odynophagia.  EGD  2009 by Dr. Leone Payor showed erosion and superficial ulcer in antrum and duodenal bulb.  NO Previous colonoscopy. No family history of colon cancer  Past Medical History:  Diagnosis Date  . Anal condyloma   . Anemia   . Diarrhea    occurs because of dialysis  . DJD (degenerative joint disease)    right knee.  All joints  . ESRD (end stage renal disease) on dialysis (HCC) 01/02/2012   Gets diaysis at [facility] on [employer], TTS schedule.  Started dialysis in 2000.     . Family history of disseminated HSV infection   . Genital herpes   . Hemodialysis patient [facility])    Tuesday, Thursday, Saturday  . History of blood transfusion   . HIV infection (HCC)   . Hypertension   . Pneumonia 1/20107   hx of  . Shortness of breath    "can happen at anytime" (03/04/2013).  03/14/16- "when I have too much fluid"  . Tertiary syphilis   . 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 Reclotted on 03/02/13 and IR placed L IJ tunneled  HD catheter (a right external jugular tunneled HD cath was attempted first but did not function due to kinking across the clavicle). Pt was seen by [facility] on same admission 6/614 and said RUA AVF no longer usable, they ordered vein mapping and will see him as outpatient to set up next permanent access.       Past Surgical History:  Procedure Laterality Date  . APPENDECTOMY    . AV FISTULA PLACEMENT Right 06/16/11  . AV FISTULA PLACEMENT Left 03/17/2016   Procedure: INSERTION OF ARTERIOVENOUS (AV) GORE-TEX GRAFT ARM USING GORETEX 4-7MM X 45CM STRETCH GRAFT;  Surgeon: Sherren Kerns, MD;  Location: [facility] OR;  Service: Vascular;  Laterality: Left;  . AV FISTULA PLACEMENT, BRACHIOCEPHALIC Right 06/16/11  . CHOLECYSTECTOMY    . INCISION AND DRAINAGE ABSCESS     abdominal  . PERIPHERAL VASCULAR CATHETERIZATION N/A 03/07/2016   Procedure: Susie Cassette;  Surgeon: Sherren Kerns, MD;  Location: [facility] INVASIVE CV LAB;  Service: Cardiovascular;  Laterality: N/A;  . REVISON OF ARTERIOVENOUS FISTULA Right 04/20/2013   Procedure: INSERTION OF ARTERIOVENOUS GORTEX GRAFT;  Surgeon: Sherren Kerns, MD;  Location: [facility] OR;  Service: Vascular;  Laterality: Right;  Ultrasound Guided  . TONSILLECTOMY      Prior to Admission medications   Medication Sig Start Date End Date Taking? Authorizing Provider  acetaminophen (TYLENOL) 500 MG tablet Take 1,000 mg by mouth every 6 (six) hours as needed for headache (pain).   Yes  Historical Provider, MD  albuterol (PROVENTIL HFA;VENTOLIN HFA) 108 (90 Base) MCG/ACT inhaler Inhale 2 puffs into the lungs every 6 (six) hours as needed for wheezing or shortness of breath.   Yes Historical Provider, MD  calcium acetate (PHOSLO) 667 MG capsule Take 2,668 mg by mouth 2 (two) times daily with a meal.    Yes Historical Provider, MD  dolutegravir (TIVICAY) 50 MG tablet Take 1 tablet (50 mg total) by mouth daily. Patient taking differently: Take 50 mg by mouth at bedtime.  10/22/15  Yes Cliffton [name], MD  NORVIR 100 MG TABS tablet TAKE 1 TABLET BY MOUTH AT BEDTIME. TAKE WITH PREZISTA TABLET 09/01/16  Yes Cliffton [name], MD  pantoprazole (PROTONIX) 40 MG tablet Take 1 tablet (40 mg total) by mouth daily. 09/13/16  Yes Almon Hercules, MD  PREZISTA 800 MG tablet TAKE 1 TABLET BY MOUTH AT BEDTIME WITH NORVIR 09/01/16  Yes Cliffton [name], MD  valACYclovir (VALTREX) 500 MG tablet Take 1 tablet (500 mg total) by mouth daily as needed (herpes outbreaks). 09/01/16  Yes Cliffton [name], MD  VIREAD 300 MG tablet TAKE 1 TABLET BY MOUTH ONCE A WEEK ON SUNDAYS Patient taking differently: TAKE 1 TABLET BY MOUTH ONCE A WEEK ON SUNDAYS AT BEDTIME 09/01/16  Yes Cliffton [name], MD    Scheduled Meds: . calcium acetate  2,668 mg Oral BID WC  . Darunavir Ethanolate  800 mg Oral Q supper  . dolutegravir  50 mg Oral QHS  . ritonavir  100 mg Oral Q supper  . sodium chloride flush  3 mL Intravenous Q12H  . tenofovir  300 mg Oral Q Sun   Continuous Infusions: . pantoprozole (PROTONIX) infusion 8 mg/hr (12/08/16 1120)   PRN Meds:.acetaminophen **OR** acetaminophen, albuterol, nicotine, ondansetron **OR** ondansetron (ZOFRAN) IV  Allergies as of 12/07/2016 - Review Complete 12/07/2016  Allergen Reaction Noted  . Penicillins Hives and Other (See Comments)   . Contrast media [iodinated diagnostic agents] Hives and Itching 02/27/2013  . Acetaminophen-codeine Nausea And Vomiting     Family History  Problem Relation Age of Onset  . Diabetes Mother   . Arthritis Father     Gout- Foot    Social History   Social History  . Marital status: Married    Spouse name: N/A  . Number of children: N/A  . Years of education: N/A   Occupational History  . Not on file.   Social History Main Topics  . Smoking status: Heavy Tobacco Smoker    Packs/day: 0.50    Years: 27.00    Types: Cigarettes  . Smokeless tobacco: Never Used     Comment: cutting back from last visit  . Alcohol use No     Comment: 03/04/2013  "stopped drinking ~ 12 yr ago; never had problem w/it".    . Drug use: No  . Sexual activity: Not Currently    Partners: Female     Comment: declined condoms   Other Topics Concern  . Not on file   Social History Narrative  . No narrative on file    Review of Systems: All negative except as stated above in HPI. Review of Systems  Constitutional: Positive for malaise/fatigue. Negative for chills and fever.  HENT: Negative for ear discharge, ear pain and hearing loss.   Eyes: Negative for blurred vision and double vision.  Respiratory: Positive for cough. Negative for shortness of breath.   Cardiovascular: Negative for chest pain and palpitations.  Gastrointestinal: Positive for abdominal pain, blood  in stool, heartburn, melena and vomiting.  Genitourinary: Negative for dysuria and urgency.  Musculoskeletal: Negative for back pain and neck pain.  Skin: Negative for rash.  Neurological: Positive for weakness. Negative for focal weakness and seizures.  Endo/Heme/Allergies: Does not bruise/bleed easily.  Psychiatric/Behavioral: Negative for hallucinations and suicidal ideas.    Physical Exam: Vital signs: Vitals:   12/08/16 0202 12/08/16 0915  BP: (!) 91/45 (!) 102/47  Pulse: 95 87  Resp: 18 18  Temp: 97.5 F (36.4 C) 98.3 F (36.8 C)   Last BM Date: 12/07/16 General:   Alert,  Well-developed, well-nourished, pleasant and cooperative in NAD HEENT : NS, AT, EOMI Neck supple. Trachea midline. Sclera - anicteric Lungs:  Clear throughout to auscultation.   No wheezes, crackles, or rhonchi. No acute distress. Heart:  Regular rate and rhythm; no murmurs, clicks, rubs,  or gallops. Abdomen: Epigastric tenderness to palpation, soft, nondistended, bowel sounds present. No peritoneal sign. LE: no edema  Psych : Mood and affect normal. Alert oriented 3 Rectal:  Deferred  GI:  Lab Results:  Recent Labs  12/07/16 2120 12/08/16 0554  WBC 12.0* 13.7*  HGB 12.8* 11.2*  HCT  39.6 34.0*  PLT 145* 122*   BMET  Recent Labs  12/07/16 2120 12/08/16 0554  NA 137 136  K 4.6 4.9  CL 92* 97*  CO2 27 21*  GLUCOSE 88 78  BUN 69* 84*  CREATININE 10.39* 11.15*  CALCIUM 9.1 8.7*   LFT  Recent Labs  12/07/16 2120  PROT 7.9  ALBUMIN 3.0*  AST 20  ALT 25  ALKPHOS 261*  BILITOT 0.6   PT/INR  Recent Labs  12/07/16 2120  LABPROT 13.9  INR 1.07     Studies/Results: Ct Head Wo Contrast  Result Date: 12/08/2016 CLINICAL DATA:  Altered mental status. History of hypertension and HIV. EXAM: CT HEAD WITHOUT CONTRAST TECHNIQUE: Contiguous axial images were obtained from the base of the skull through the vertex without intravenous contrast. COMPARISON:  None. FINDINGS: Brain: Diffuse cerebral atrophy. No ventricular dilatation. Low-attenuation changes in the deep white matter consistent with small vessel ischemic change or other demyelinating process. Areas of encephalomalacia in the occipital lobes bilaterally may indicate old infarcts. Could also consider possibility of posterior reversible encephalopathy syndrome. Parenchymal calcification may indicate dystrophic change or old infection. Meningeal calcifications on the falx and tentorium also could indicate postinflammatory change. No mass-effect or midline shift. Gray-white matter junctions are distinct. Basal cisterns are not effaced. No acute intracranial hemorrhage. Vascular: Vascular calcifications are present. Skull: Calvarium appears intact. Sinuses/Orbits: Mucosal thickening in the paranasal sinuses. No acute air-fluid levels. Prominent aeration of mastoid air cells, extending into the temporal bones. No mastoid effusions. Other: None. IMPRESSION: Diffuse cerebral atrophy. Parenchyma wall and meningeal calcifications could represent dystrophic change or old inflammatory process. Low-attenuation changes in the occipital lobes could represent old infarcts or possibly posterior reversible encephalopathy syndrome.  Consider MRI if clinically indicated. Electronically Signed   By: Burman NievesWilliam  Stevens M.D.   On: 12/08/2016 05:59   Dg Abd Acute W/chest  Result Date: 12/08/2016 CLINICAL DATA:  Initial evaluation for acute epigastric abdominal pain. EXAM: DG ABDOMEN ACUTE W/ 1V CHEST COMPARISON:  Prior radiograph from 11/08/2015. FINDINGS: Cardiac and mediastinal silhouettes are within normal limits. Lungs are mildly hypoinflated. Minimal patchy densities overlying the lung bases on frontal view the chest do not persists on view of the upper abdomen, and are likely related to bronchovascular crowding. No focal infiltrates. No pulmonary edema or pleural  effusion. No pneumothorax. Vascular stents overlie the left subclavian and right axillary region. Bowel gas pattern within normal limits without evidence for obstruction or ileus. No abnormal bowel wall thickening. No free air. No soft tissue mass. Probable calcified gallstone noted within the right upper quadrant. Extensive atherosclerosis noted. No acute osseous abnormality. IMPRESSION: 1. Nonobstructive bowel gas pattern with no radiographic evidence for acute intra-abdominal process. 2. No active cardiopulmonary disease. 3. Cholelithiasis. 4. Extensive atherosclerosis. Electronically Signed   By: Rise Mu M.D.   On: 12/08/2016 01:57    Impression/Plan: - Melena and coffee-ground emesis. History of clean based duodenal and prepyloric ulcer in 2009. - Mild drop in hemoglobin. - Occult blood positive stool - End Stage renal disease on hemodialysis - Thrombocytopenia. - Elevated alkaline phosphatase.  Recommendations ------------------------ - Patient will need EGD tomorrow for further evaluation. Risk benefits alternatives discussed with the patient. Verbalized understanding. - Monitor H&H. Continue PPI. - We'll get hepatitis panel/ secondary  markers for liver disease. Ultrasound RUQ  - Recommend outpatient colonoscopy once acute issues are resolved for  occult blood positive stool. - GI will follow. - Avoid NSAIDs.   LOS: 1 day   Kathi Der  MD, FACP 12/08/2016, 1:22 PM  Pager [phone] If no answer or after 5 PM call [phone]

## 2016-12-08 NOTE — Progress Notes (Signed)
Initial Nutrition Assessment  DOCUMENTATION CODES:   Not applicable  INTERVENTION:  RD to order nutritional supplements as appropriate once diet advances.   NUTRITION DIAGNOSIS:   Increased nutrient needs related to chronic illness as evidenced by estimated needs.  GOAL:   Patient will meet greater than or equal to 90% of their needs  MONITOR:   Diet advancement, Labs, Weight trends, Skin, I & O's  REASON FOR ASSESSMENT:   Malnutrition Screening Tool    ASSESSMENT:   51 y.o. male presenting with coffee ground emesis and melena, likely upper GI bleed. 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  Pt is currently NPO. Pt reports his abdominal pains have improved. He reports poor po the 1 day prior to admission, however was eating well prior to that with usual consumption of at least 2 meals a day. Pt reports weight has been fluctuating however mostly stable. RD to order nutritional supplements as appropriate once diet advances.   Nutrition-Focused physical exam completed. Findings are no fat depletion, mild muscle depletion, and no edema.   Labs and medications reviewed.   Diet Order:  Diet NPO time specified Except for: Sips with Meds  Skin:  Reviewed, no issues  Last BM:  3/11  Height:   Ht Readings from Last 1 Encounters:  12/08/16 6' (1.829 m)    Weight:   Wt Readings from Last 1 Encounters:  12/08/16 156 lb 1.6 oz (70.8 kg)    Ideal Body Weight:  83.63 kg  BMI:  Body mass index is 21.17 kg/m.  Estimated Nutritional Needs:   Kcal:  2150-2350  Protein:  100-110 grams  Fluid:  Per MD  EDUCATION NEEDS:   No education needs identified at this time  Roslyn SmilingStephanie Blinda Turek, MS, RD, LDN Pager # [phone] After hours/ weekend pager # [phone]

## 2016-12-08 NOTE — Progress Notes (Signed)
Patient educated about Ritonovir and Prezista that medication should be given with food for absorption. Pt wife states that maybe why he has been having stomach issues. Printed out information for them to read and underlined should be taken with full glass of water and given with food. Both patient understands. Ilean SkillVeronica Cordella Nyquist LPN

## 2016-12-08 NOTE — Consult Note (Signed)
[facility] Renal Consultation Note    Indication for Consultation:  Management of ESRD/hemodialysis, anemia, hypertension/volume, and secondary hyperparathyroidism. PCP:  HPI: [name] is a 51 y.o. male with ESRD, HIV (well controlled), HTN, GERD who was admitted with GI bleed.  History from his wife since patient is in and out of sleep. Was in his usual state of health until yesterday when he began passing dark stools and then vomited blood twice. He was also noted to be confused compared to baseline. In the ED, found to have +guaiac stools, but Hgb ~12. They deny prior GI bleeds. He tells me he had a "stomach mass" removed in the past, ?mesenteric abscess in med records (2012). Unclear exact etiology. Has been taking NSAIDs (Advil) for L shoulder pains recently.   Usually dialyzes TTS at [facility]. She has been concerned that he has not had much fluid removed with dialysis recently, specifically on last HD 3/10. He has been cutting his HD time recently due to arm pains (which has combination of AVF issues and ?shoulder pains, seems to be improved now). Denies fevers or chills. No CP or dyspnea currently.  Past Medical History:  Diagnosis Date  . Anal condyloma   . Anemia   . Diarrhea    occurs because of dialysis  . DJD (degenerative joint disease)    right knee.  All joints  . ESRD (end stage renal disease) on dialysis (HCC) 01/02/2012   Gets diaysis at [facility] on [employer], TTS schedule.  Started dialysis in 2000.     . Family history of disseminated HSV infection   . Genital herpes   . Hemodialysis patient [facility])    Tuesday, Thursday, Saturday  . History of blood transfusion   . HIV infection (HCC)   . Hypertension   . Pneumonia 1/20107   hx of  . Shortness of breath    "can happen at anytime" (03/04/2013).  03/14/16- "when I have too much fluid"  . Tertiary syphilis   . 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 Reclotted on 03/02/13 and IR placed L IJ tunneled HD catheter (a right external jugular tunneled HD cath was attempted first but did not function due to kinking across the clavicle). Pt was seen by VVS on same admission 6/614 and said RUA AVF no longer usable, they ordered vein mapping and will see him as outpatient to set up next permanent access.      Past Surgical History:  Procedure Laterality Date  . APPENDECTOMY    . AV FISTULA PLACEMENT Right 06/16/11  . AV FISTULA PLACEMENT Left 03/17/2016   Procedure: INSERTION OF ARTERIOVENOUS (AV) GORE-TEX GRAFT ARM USING GORETEX 4-7MM X 45CM STRETCH GRAFT;  Surgeon: Sherren Kerns, MD;  Location: [facility] OR;  Service: Vascular;  Laterality: Left;  . AV FISTULA PLACEMENT, BRACHIOCEPHALIC Right 06/16/11  . CHOLECYSTECTOMY    . INCISION AND DRAINAGE ABSCESS     abdominal  . PERIPHERAL VASCULAR CATHETERIZATION N/A 03/07/2016   Procedure: Susie Cassette;  Surgeon: Sherren Kerns, MD;  Location: [facility] INVASIVE CV LAB;  Service: Cardiovascular;  Laterality: N/A;  . REVISON OF ARTERIOVENOUS FISTULA Right 04/20/2013   Procedure: INSERTION OF ARTERIOVENOUS GORTEX GRAFT;  Surgeon: Sherren Kerns, MD;  Location: [facility] OR;  Service: Vascular;  Laterality: Right;  Ultrasound Guided  . TONSILLECTOMY     Family History  Problem Relation Age of Onset  . Diabetes  Mother   . Arthritis Father     Gout- Foot   Social History:  reports that he has been smoking Cigarettes.  He has a 13.50 pack-year smoking history. He has never used smokeless tobacco. He reports that he does not drink alcohol or use drugs.  ROS: As per HPI otherwise negative.  Physical Exam: Vitals:   12/08/16 0020 12/08/16 0040 12/08/16 0202 12/08/16 0915  BP: (!) 100/27 (!) 122/52 (!) 91/45 (!) 102/47  Pulse:   95 87  Resp: 19 21 18 18   Temp:   97.5 F (36.4 C) 98.3 F (36.8 C)  TempSrc:   Oral Oral  SpO2:   100% 99%  Weight:   70.8 kg (156 lb 1.6 oz)    Height:   6' (1.829 m)      General: Well developed, well nourished, in no acute distress, but sleepy during interview Head: Normocephalic, atraumatic, sclera non-icteric, mucus membranes are moist. Neck: Supple without lymphadenopathy/masses.  Lungs: Clear bilaterally to auscultation without wheezes, rales, or rhonchi. Breathing is unlabored. Heart: RRR; 2/6 systolic murmur Abdomen: Soft, mid generalized tenderness without guarding. Lower extremities: No edema or ischemic changes, no open wounds. Psych:  Responds to questions appropriately with a normal affect. Dialysis Access: LUE AVF + thrill/bruit  Allergies  Allergen Reactions  . Penicillins Hives and Other (See Comments)    Rx required Hospitalization Has patient had a PCN reaction causing immediate rash, facial/tongue/throat swelling, SOB or lightheadedness with hypotension: Yes Has patient had a PCN reaction causing severe rash involving mucus membranes or skin necrosis: No Has patient had a PCN reaction that required hospitalization Yes Has patient had a PCN reaction occurring within the last 10 years: No   . Contrast Media [Iodinated Diagnostic Agents] Hives and Itching  . Acetaminophen-Codeine Nausea And Vomiting   Prior to Admission medications   Medication Sig Start Date End Date Taking? Authorizing Provider  acetaminophen (TYLENOL) 500 MG tablet Take 1,000 mg by mouth every 6 (six) hours as needed for headache (pain).   Yes Historical Provider, MD  albuterol (PROVENTIL HFA;VENTOLIN HFA) 108 (90 Base) MCG/ACT inhaler Inhale 2 puffs into the lungs every 6 (six) hours as needed for wheezing or shortness of breath.   Yes Historical Provider, MD  calcium acetate (PHOSLO) 667 MG capsule Take 2,668 mg by mouth 2 (two) times daily with a meal.    Yes Historical Provider, MD  dolutegravir (TIVICAY) 50 MG tablet Take 1 tablet (50 mg total) by mouth daily. Patient taking differently: Take 50 mg by mouth at bedtime.  10/22/15  Yes  Cliffton [name], MD  NORVIR 100 MG TABS tablet TAKE 1 TABLET BY MOUTH AT BEDTIME. TAKE WITH PREZISTA TABLET 09/01/16  Yes Cliffton [name], MD  pantoprazole (PROTONIX) 40 MG tablet Take 1 tablet (40 mg total) by mouth daily. 09/13/16  Yes Almon Hercules, MD  PREZISTA 800 MG tablet TAKE 1 TABLET BY MOUTH AT BEDTIME WITH NORVIR 09/01/16  Yes Cliffton [name], MD  valACYclovir (VALTREX) 500 MG tablet Take 1 tablet (500 mg total) by mouth daily as needed (herpes outbreaks). 09/01/16  Yes Cliffton [name], MD  VIREAD 300 MG tablet TAKE 1 TABLET BY MOUTH ONCE A WEEK ON SUNDAYS Patient taking differently: TAKE 1 TABLET BY MOUTH ONCE A WEEK ON SUNDAYS AT BEDTIME 09/01/16  Yes Cliffton [name], MD   Current Facility-Administered Medications  Medication Dose Route Frequency Provider Last Rate Last Dose  . acetaminophen (TYLENOL) tablet 650 mg  650 mg Oral Q6H PRN  Campbell StallKaty Dodd Mayo, MD   650 mg at 12/08/16 [id number]   Or  . acetaminophen (TYLENOL) suppository 650 mg  650 mg Rectal Q6H PRN Campbell StallKaty Dodd Mayo, MD      . albuterol (PROVENTIL) (2.5 MG/3ML) 0.083% nebulizer solution 2.5 mg  2.5 mg Inhalation Q6H PRN Campbell StallKaty Dodd Mayo, MD      . calcium acetate (PHOSLO) capsule 2,668 mg  2,668 mg Oral BID WC Campbell StallKaty Dodd Mayo, MD   2,668 mg at 12/08/16 0755  . Darunavir Ethanolate (PREZISTA) tablet 800 mg  800 mg Oral Q supper Campbell StallKaty Dodd Mayo, MD      . dolutegravir [facility](TIVICAY) tablet 50 mg  50 mg Oral QHS Campbell StallKaty Dodd Mayo, MD      . nicotine (NICODERM CQ - dosed in mg/24 hours) patch 14 mg  14 mg Transdermal Daily PRN Campbell StallKaty Dodd Mayo, MD      . ondansetron [facility](ZOFRAN) tablet 4 mg  4 mg Oral Q6H PRN Campbell StallKaty Dodd Mayo, MD       Or  . ondansetron [facility](ZOFRAN) injection 4 mg  4 mg Intravenous Q6H PRN Campbell StallKaty Dodd Mayo, MD      . pantoprazole (PROTONIX) 80 mg in sodium chloride 0.9 % 250 mL (0.32 mg/mL) infusion  8 mg/hr Intravenous Continuous Arthor CaptainAbigail Harris, PA-C 25 mL/hr at 12/08/16 1120 8 mg/hr at 12/08/16 1120  . ritonavir (NORVIR) tablet 100 mg  100 mg Oral Q supper  Campbell StallKaty Dodd Mayo, MD      . sodium chloride flush (NS) 0.9 % injection 3 mL  3 mL Intravenous Q12H Campbell StallKaty Dodd Mayo, MD      . tenofovir Stevie Kern(VIREAD) tablet 300 mg  300 mg Oral Q [place] Dodd Mayo, MD   300 mg at 12/08/16 0355   Labs: Basic Metabolic Panel:  Recent Labs Lab 12/07/16 2120 12/08/16 0554  NA 137 136  K 4.6 4.9  CL 92* 97*  CO2 27 21*  GLUCOSE 88 78  BUN 69* 84*  CREATININE 10.39* 11.15*  CALCIUM 9.1 8.7*   Liver Function Tests:  Recent Labs Lab 12/07/16 2120  AST 20  ALT 25  ALKPHOS 261*  BILITOT 0.6  PROT 7.9  ALBUMIN 3.0*    Recent Labs Lab 12/07/16 2120  LIPASE 13   CBC:  Recent Labs Lab 12/07/16 2120 12/08/16 0554  WBC 12.0* 13.7*  NEUTROABS 6.9  --   HGB 12.8* 11.2*  HCT 39.6 34.0*  MCV 97.5 96.9  PLT 145* 122*   Cardiac Enzymes:  Recent Labs Lab 12/08/16 0208  TROPONINI <0.03   CBG:  Recent Labs Lab 12/07/16 2102  GLUCAP 67   Studies/Results: Ct Head Wo Contrast  Result Date: 12/08/2016 CLINICAL DATA:  Altered mental status. History of hypertension and HIV. EXAM: CT HEAD WITHOUT CONTRAST TECHNIQUE: Contiguous axial images were obtained from the base of the skull through the vertex without intravenous contrast. COMPARISON:  None. FINDINGS: Brain: Diffuse cerebral atrophy. No ventricular dilatation. Low-attenuation changes in the deep white matter consistent with small vessel ischemic change or other demyelinating process. Areas of encephalomalacia in the occipital lobes bilaterally may indicate old infarcts. Could also consider possibility of posterior reversible encephalopathy syndrome. Parenchymal calcification may indicate dystrophic change or old infection. Meningeal calcifications on the falx and tentorium also could indicate postinflammatory change. No mass-effect or midline shift. Gray-white matter junctions are distinct. Basal cisterns are not effaced. No acute intracranial hemorrhage. Vascular: Vascular calcifications are  present. Skull: Calvarium appears intact. Sinuses/Orbits: Mucosal thickening in the  paranasal sinuses. No acute air-fluid levels. Prominent aeration of mastoid air cells, extending into the temporal bones. No mastoid effusions. Other: None. IMPRESSION: Diffuse cerebral atrophy. Parenchyma wall and meningeal calcifications could represent dystrophic change or old inflammatory process. Low-attenuation changes in the occipital lobes could represent old infarcts or possibly posterior reversible encephalopathy syndrome. Consider MRI if clinically indicated. Electronically Signed   By: Burman Nieves M.D.   On: 12/08/2016 05:59   Dg Abd Acute W/chest  Result Date: 12/08/2016 CLINICAL DATA:  Initial evaluation for acute epigastric abdominal pain. EXAM: DG ABDOMEN ACUTE W/ 1V CHEST COMPARISON:  Prior radiograph from 11/08/2015. FINDINGS: Cardiac and mediastinal silhouettes are within normal limits. Lungs are mildly hypoinflated. Minimal patchy densities overlying the lung bases on frontal view the chest do not persists on view of the upper abdomen, and are likely related to bronchovascular crowding. No focal infiltrates. No pulmonary edema or pleural effusion. No pneumothorax. Vascular stents overlie the left subclavian and right axillary region. Bowel gas pattern within normal limits without evidence for obstruction or ileus. No abnormal bowel wall thickening. No free air. No soft tissue mass. Probable calcified gallstone noted within the right upper quadrant. Extensive atherosclerosis noted. No acute osseous abnormality. IMPRESSION: 1. Nonobstructive bowel gas pattern with no radiographic evidence for acute intra-abdominal process. 2. No active cardiopulmonary disease. 3. Cholelithiasis. 4. Extensive atherosclerosis. Electronically Signed   By: Rise Mu M.D.   On: 12/08/2016 01:57    Dialysis Orders:  TTS at [facility] 3:45 hours, BFR400/DFR800, EDW 71kg, 2K/2.5Ca bath, Profile #4, Linear  Na, AVG - Heparin 3000 bolus q HD - Hectoral IV q HD - No ESA   Assessment/Plan: 1.  GI bleed: Per primary. On PPI. GI consult pending. Hgb still at a decent level. 2.  ESRD: Will continue HD on usual TTS schedule, next 3/13. No heparin with HD. 3.  Hypertension/volume: BP low side, no edema. Slightly below EDW today, low UF with next HD. 4.  Anemia: Hgb 11.2, monitor closely. No ESA yet. 5.  Metabolic bone disease: Ca ok, Phos usually very high. Continue Phoslo with meals. 6.  Nutrition: Alb 3.0. Could start Pro-stat. 7. HIV: Continue HAART meds.  Ozzie Hoyle, PA-C 12/08/2016, 12:01 PM  [facility] Pager: [phone]  I have seen and examined this patient and agree with plan and assessment in the above note with renal recommendations/intervention highlighted. Await GI evaluation and plan.  No heparin with HD tomorrow.  Jomarie Longs A Antrell Tipler,MD 12/08/2016 1:17 PM

## 2016-12-09 ENCOUNTER — Inpatient Hospital Stay (HOSPITAL_COMMUNITY): Payer: Medicare Other | Admitting: Certified Registered"

## 2016-12-09 ENCOUNTER — Inpatient Hospital Stay (HOSPITAL_COMMUNITY): Payer: Medicare Other

## 2016-12-09 ENCOUNTER — Encounter (HOSPITAL_COMMUNITY): Payer: Self-pay | Admitting: *Deleted

## 2016-12-09 ENCOUNTER — Encounter (HOSPITAL_COMMUNITY)
Admission: EM | Disposition: A | Discharge status: Home / Self Care | Payer: Self-pay | Source: Home / Self Care | Attending: Family Medicine

## 2016-12-09 DIAGNOSIS — L97509 Non-pressure chronic ulcer of other part of unspecified foot with unspecified severity: Secondary | ICD-10-CM

## 2016-12-09 DIAGNOSIS — L97529 Non-pressure chronic ulcer of other part of left foot with unspecified severity: Secondary | ICD-10-CM

## 2016-12-09 HISTORY — PX: ESOPHAGOGASTRODUODENOSCOPY: SHX5428

## 2016-12-09 LAB — MITOCHONDRIAL ANTIBODIES: Mitochondrial M2 Ab, IgG: 6.8 Units (ref 0.0–20.0)

## 2016-12-09 LAB — BASIC METABOLIC PANEL
ANION GAP: 19 — AB (ref 5–15)
BUN: 99 mg/dL — ABNORMAL HIGH (ref 6–20)
CALCIUM: 8.3 mg/dL — AB (ref 8.9–10.3)
CO2: 21 mmol/L — AB (ref 22–32)
Chloride: 95 mmol/L — ABNORMAL LOW (ref 101–111)
Creatinine, Ser: 13.31 mg/dL — ABNORMAL HIGH (ref 0.61–1.24)
GFR calc Af Amer: 4 mL/min — ABNORMAL LOW (ref >60)
GFR calc non Af Amer: 4 mL/min — ABNORMAL LOW (ref >60)
GLUCOSE: 76 mg/dL (ref 65–99)
Potassium: 5.2 mmol/L — ABNORMAL HIGH (ref 3.5–5.1)
Sodium: 135 mmol/L (ref 135–145)

## 2016-12-09 LAB — ANTINUCLEAR ANTIBODIES, IFA: ANA Ab, IFA: NEGATIVE

## 2016-12-09 LAB — CBC
HCT: 27.4 % — ABNORMAL LOW (ref 39.0–52.0)
HEMATOCRIT: 27.4 % — AB (ref 39.0–52.0)
HEMOGLOBIN: 8.9 g/dL — AB (ref 13.0–17.0)
Hemoglobin: 9.1 g/dL — ABNORMAL LOW (ref 13.0–17.0)
MCH: 31.9 pg (ref 26.0–34.0)
MCH: 31.3 pg (ref 26.0–34.0)
MCHC: 33.2 g/dL (ref 30.0–36.0)
MCHC: 32.5 g/dL (ref 30.0–36.0)
MCV: 96.1 fL (ref 78.0–100.0)
MCV: 96.5 fL (ref 78.0–100.0)
Platelets: 129 10*3/uL — ABNORMAL LOW (ref 150–400)
Platelets: 128 10*3/uL — ABNORMAL LOW (ref 150–400)
RBC: 2.85 MIL/uL — ABNORMAL LOW (ref 4.22–5.81)
RBC: 2.84 MIL/uL — AB (ref 4.22–5.81)
RDW: 15.5 % (ref 11.5–15.5)
RDW: 15.4 % (ref 11.5–15.5)
WBC: 9.5 10*3/uL (ref 4.0–10.5)
WBC: 8.7 10*3/uL (ref 4.0–10.5)

## 2016-12-09 LAB — ANTI-SMOOTH MUSCLE ANTIBODY, IGG: F-Actin IgG: 17 Units (ref 0–19)

## 2016-12-09 LAB — HIV-1 RNA QUANT-NO REFLEX-BLD
HIV 1 RNA Quant: 250 copies/mL
LOG10 HIV-1 RNA: 2.398 {Log_copies}/mL

## 2016-12-09 LAB — HEPATITIS PANEL, ACUTE
HCV Ab: <0.1 s/co ratio (ref 0.0–0.9)
HEP A IGM: NEGATIVE
HEP B S AG: NEGATIVE
Hep B C IgM: NEGATIVE

## 2016-12-09 LAB — FOLATE RBC
FOLATE, HEMOLYSATE: 419.4 ng/mL
Folate, RBC: 1259 ng/mL (ref >498)
Hematocrit: 33.3 % — ABNORMAL LOW (ref 37.5–51.0)

## 2016-12-09 SURGERY — EGD (ESOPHAGOGASTRODUODENOSCOPY)
Anesthesia: Monitor Anesthesia Care

## 2016-12-09 MED ORDER — DOCUSATE SODIUM 100 MG PO CAPS
100.0000 mg | ORAL_CAPSULE | Freq: Every day | ORAL | Status: DC
  Administered 2016-12-09: 100 mg via ORAL
  Filled 2016-12-09: qty 1

## 2016-12-09 MED ORDER — PHENYLEPHRINE HCL 10 MG/ML IJ SOLN
INTRAVENOUS | Status: DC | PRN
  Administered 2016-12-09: 150 ug/min via INTRAVENOUS

## 2016-12-09 MED ORDER — DARBEPOETIN ALFA 60 MCG/0.3ML IJ SOSY: 60.0000 ug | PREFILLED_SYRINGE | INTRAMUSCULAR | Status: DC

## 2016-12-09 MED ORDER — PANTOPRAZOLE SODIUM 40 MG PO TBEC
40.0000 mg | DELAYED_RELEASE_TABLET | Freq: Two times a day (BID) | ORAL | Status: DC
  Administered 2016-12-09 (×2): 40 mg via ORAL
  Filled 2016-12-09 (×4): qty 1

## 2016-12-09 MED ORDER — PHENYLEPHRINE 40 MCG/ML (10ML) SYRINGE FOR IV PUSH (FOR BLOOD PRESSURE SUPPORT)
PREFILLED_SYRINGE | INTRAVENOUS | Status: DC | PRN
  Administered 2016-12-09 (×3): 80 ug via INTRAVENOUS
  Administered 2016-12-09: 40 ug via INTRAVENOUS
  Administered 2016-12-09: 120 ug via INTRAVENOUS

## 2016-12-09 MED ORDER — LIDOCAINE 2% (20 MG/ML) 5 ML SYRINGE
INTRAMUSCULAR | Status: DC | PRN
  Administered 2016-12-09: 80 mg via INTRAVENOUS

## 2016-12-09 MED ORDER — SODIUM CHLORIDE 0.9 % IV SOLN
INTRAVENOUS | Status: DC
  Administered 2016-12-09: 20 mL/h via INTRAVENOUS

## 2016-12-09 MED ORDER — PROPOFOL 500 MG/50ML IV EMUL
INTRAVENOUS | Status: DC | PRN
  Administered 2016-12-09: 200 ug/kg/min via INTRAVENOUS

## 2016-12-09 NOTE — Transfer of Care (Signed)
Immediate Anesthesia Transfer of Care Note  Patient: [name]  Procedure(s) Performed: Procedure(s): ESOPHAGOGASTRODUODENOSCOPY (EGD) (N/A)  Patient Location: Endoscopy Unit  Anesthesia Type:MAC  Level of Consciousness: awake, alert , oriented and patient cooperative  Airway & Oxygen Therapy: Patient Spontanous Breathing and Patient connected to nasal cannula oxygen  Post-op Assessment: Report given to RN, Post -op Vital signs reviewed and stable and Patient moving all extremities  Post vital signs: Reviewed and stable  Last Vitals:  Vitals:   12/09/16 1034 12/09/16 1133  BP: (!) 106/33 95/77  Pulse: 73   Resp: 16   Temp: 36.4 C 36.4 C    Last Pain:  Vitals:   12/09/16 1133  TempSrc: Oral  PainSc: 0-No pain         Complications: No apparent anesthesia complications

## 2016-12-09 NOTE — Progress Notes (Signed)
[name] Progress Note  Caleb BaldingJeffrey E Bannister 51 y.o. 1966-07-23  CC:  GI bleed   Subjective: No bowel movement since admission. Denied any nausea and vomiting. Unfortunately, patient did not had lab drawn this morning. Complaining of mild abdominal distention with some epigastric discomfort  ROS : Negative for nausea and vomiting. Negative for active bleeding   Objective: Vital signs in last 24 hours: Vitals:   12/09/16 0525 12/09/16 0919  BP: 100/60 (!) 87/29  Pulse:  72  Resp:  17  Temp:  97.6 F (36.4 C)    Physical Exam:  General:   Alert,  Well-developed, well-nourished, pleasant and cooperative in NAD HEENT : NS, AT, EOMI Neck supple. Trachea midline. Sclera - anicteric Lungs:  Clear throughout to auscultation.   No wheezes, crackles, or rhonchi. No acute distress. Heart:  Regular rate and rhythm; no murmurs, clicks, rubs,  or gallops. Abdomen: Epigastric tenderness to palpation, soft, mild distended, bowel sounds present. No peritoneal sign. LE: no edema  Psych : Mood and affect normal. Alert oriented 3   Lab Results:  Recent Labs  12/07/16 2120 12/08/16 0554  NA 137 136  K 4.6 4.9  CL 92* 97*  CO2 27 21*  GLUCOSE 88 78  BUN 69* 84*  CREATININE 10.39* 11.15*  CALCIUM 9.1 8.7*    Recent Labs  12/07/16 2120  AST 20  ALT 25  ALKPHOS 261*  BILITOT 0.6  PROT 7.9  ALBUMIN 3.0*    Recent Labs  12/07/16 2120 12/08/16 0554  WBC 12.0* 13.7*  NEUTROABS 6.9  --   HGB 12.8* 11.2*  HCT 39.6 34.0*  MCV 97.5 96.9  PLT 145* 122*    Recent Labs  12/07/16 2120  LABPROT 13.9  INR 1.07      Assessment/Plan: - Melena and coffee-ground emesis. History of clean based duodenal and prepyloric ulcer in 2009.No bowel movement since admission - Mild drop in hemoglobin. Blood work pending this morning - Occult blood positive stool - End Stage renal disease on hemodialysis - Thrombocytopenia. - Elevated alkaline  phosphatase.  Recommendations ------------------------ - EGD today.  Risk benefits alternatives discussed with the patient. Verbalized understanding. - Monitor H&H. Continue PPI. - Hepatitis panel negative. Ultrasound unremarkable for hepatobiliary disease. Showed multiple right kidney cyst - Recommend outpatient colonoscopy once acute issues are resolved for occult blood positive stool. - Avoid NSAIDs.   Kathi DerParag Laney Louderback MD, FACP 12/09/2016, 9:29 AM  Pager [phone]  If no answer or after 5 PM call [phone]

## 2016-12-09 NOTE — Progress Notes (Signed)
Family Medicine Teaching Service Daily Progress Note Intern Pager: [phone]  Patient name: [name] Medical record number: [id number] Date of birth: May 10, 1966 Age: 51 y.o. Gender: male  Primary Care Provider: Almon Herculesaye T Gonfa, MD Consultants: Gastroenterology Code Status: Full code  Pt Overview and Major Events to Date:  Caleb BaldingJeffrey E Bert is a 51 y.o. male presenting with coffee ground emesis and melena, likely upper GI bleed. 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  Coffee Ground Emesis / Melena / Abdominal Pain: EGD revealed reflux esophagitis, gastric erosions without bleeding, gastritis and duodenitis. GI recommended increasing PPI to BID, avoiding NSAIDs and monitoring H/H.  Hgb down to 9.1 from 11.2.  - GI consulted; appreciate recommendations - Protonix BID - Zofran prn for nausea - RUQ US- showed cholethiasis  - hepatitis panel- negative - anti-smooth muscle abs/ANA/mitrochondial abs: negative - CBC at 1500 and AM  Acute Encephalopathy: AAOx3 without signs of infection. Sitting on edge of bed, talkative and much improved from yesterday. Ammonia elevated to 52, TSH, B12 and  TSH and B12 normal and folate pending.  - Unable to obtain UDS because patient doesn't void - continue to monitor neuro status - HD today  Hypotension: BPs in the 100s-120s systolics. Appears to be baseline for patient. - Continue to monitor  HIV: Followed by Dr. Cliffton AstersJohn Campbell. 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 Receives HD on T/Th/Sat..  - Continue home Phoslo 667mg  bid - HD today  HSV-2:  - Holding home Valtrex, as Pt denies active outbreak  Chronic Bilateral Shoulder Pain: - Tylenol prn - K  pad  Tobacco use: Smokes 1 pack every 2-3 days. - Nicotine patch prn  Left 4th and 5th toe ulcers: Pain out of proportion to exam. Fresh ulcers between webs of 4th and 5th digits on L foot. No erythema, swelling, or pus. No fevers or leukocytosis.  - xray foot to r/o osteo - wound care   FEN/GI: NPO due to AMS Prophylaxis: SCDs with active GI bleed  Disposition: DC pending stability of hemoglobin  Subjective:  Feels well this morning. Only complaint is pain in his 4th and 4th L toe. Denies CP, SOB, abd pain, NV or diarrhea.   Objective: Temp:  [97.7 F (36.5 C)-98.4 F (36.9 C)] 97.7 F (36.5 C) (03/13 0510) Pulse Rate:  [78-87] 78 (03/13 0510) Resp:  [16-18] 16 (03/13 0510) BP: (78-105)/(37-62) 100/60 (03/13 0525) SpO2:  [97 %-100 %] 100 % (03/13 0510) Weight:  [159 lb (72.1 kg)] 159 lb (72.1 kg) (03/12 2049) Physical Exam: General: 51 year old male sitting up in bed appearing uncomfortable but in no acute distress Cardiovascular: Regular rate and rhythm, no murmurs rubs or gallops Respiratory: No increased work of breathing, clear to auscultation bilaterally, no wheezing or rhonchi Abdomen: Soft, moderately tender epigastrium, right upper quadrant without Murphy's sign and right lower quadrant without peritoneal signs Extremities: Warm and well-perfused, no edema Psych: Appears somnolent, but with normal mood and affect  Laboratory:  Recent Labs Lab 12/07/16 2120 12/08/16 0554  WBC 12.0* 13.7*  HGB 12.8* 11.2*  HCT 39.6 34.0*  PLT 145* 122*    Recent Labs Lab 12/07/16 2120 12/08/16 0554  NA 137 136  K 4.6  4.9  CL 92* 97*  CO2 27 21*  BUN 69* 84*  CREATININE 10.39* 11.15*  CALCIUM 9.1 8.7*  PROT 7.9  --   BILITOT 0.6  --   ALKPHOS 261*  --   ALT 25  --   AST 20  --   GLUCOSE 88 78    Imaging/Diagnostic Tests: Ct Head Wo Contrast  Result Date: 12/08/2016 CLINICAL DATA:  Altered mental status. History of hypertension and HIV. EXAM: CT HEAD  WITHOUT CONTRAST TECHNIQUE: Contiguous axial images were obtained from the base of the skull through the vertex without intravenous contrast. COMPARISON:  None. FINDINGS: Brain: Diffuse cerebral atrophy. No ventricular dilatation. Low-attenuation changes in the deep white matter consistent with small vessel ischemic change or other demyelinating process. Areas of encephalomalacia in the occipital lobes bilaterally may indicate old infarcts. Could also consider possibility of posterior reversible encephalopathy syndrome. Parenchymal calcification may indicate dystrophic change or old infection. Meningeal calcifications on the falx and tentorium also could indicate postinflammatory change. No mass-effect or midline shift. Gray-white matter junctions are distinct. Basal cisterns are not effaced. No acute intracranial hemorrhage. Vascular: Vascular calcifications are present. Skull: Calvarium appears intact. Sinuses/Orbits: Mucosal thickening in the paranasal sinuses. No acute air-fluid levels. Prominent aeration of mastoid air cells, extending into the temporal bones. No mastoid effusions. Other: None. IMPRESSION: Diffuse cerebral atrophy. Parenchyma wall and meningeal calcifications could represent dystrophic change or old inflammatory process. Low-attenuation changes in the occipital lobes could represent old infarcts or possibly posterior reversible encephalopathy syndrome. Consider MRI if clinically indicated. Electronically Signed   By: Burman Nieves M.D.   On: 12/08/2016 05:59   Dg Abd Acute W/chest  Result Date: 12/08/2016 CLINICAL DATA:  Initial evaluation for acute epigastric abdominal pain. EXAM: DG ABDOMEN ACUTE W/ 1V CHEST COMPARISON:  Prior radiograph from 11/08/2015. FINDINGS: Cardiac and mediastinal silhouettes are within normal limits. Lungs are mildly hypoinflated. Minimal patchy densities overlying the lung bases on frontal view the chest do not persists on view of the upper abdomen, and are  likely related to bronchovascular crowding. No focal infiltrates. No pulmonary edema or pleural effusion. No pneumothorax. Vascular stents overlie the left subclavian and right axillary region. Bowel gas pattern within normal limits without evidence for obstruction or ileus. No abnormal bowel wall thickening. No free air. No soft tissue mass. Probable calcified gallstone noted within the right upper quadrant. Extensive atherosclerosis noted. No acute osseous abnormality. IMPRESSION: 1. Nonobstructive bowel gas pattern with no radiographic evidence for acute intra-abdominal process. 2. No active cardiopulmonary disease. 3. Cholelithiasis. 4. Extensive atherosclerosis. Electronically Signed   By: Rise Mu M.D.   On: 12/08/2016 01:57   US Abdomen Limited Ruq  Result Date: 12/08/2016 CLINICAL DATA:  Abnormal LFTs EXAM: US ABDOMEN LIMITED - RIGHT UPPER QUADRANT COMPARISON:  CT 10/13/2015 FINDINGS: Gallbladder: Calcified mobile gallstone measuring up to 1.6 cm. Negative sonographic Murphy's. Normal wall thickness. Common bile duct: Diameter: Normal at 3.7 mm Liver: No focal lesion identified. Within normal limits in parenchymal echogenicity. Incidentally noted is an echogenic right kidney containing multiple cysts. IMPRESSION: 1. Cholelithiasis without sonographic evidence for acute cholecystitis or biliary dilatation 2. Echogenic right kidney containing multiple cysts Electronically Signed   By: Jasmine Pang M.D.   On: 12/08/2016 19:56    Renne Musca, MD 12/09/2016, 7:22 AM PGY-1, [facility] Family Medicine FPTS Intern pager: [phone], text pages welcome

## 2016-12-09 NOTE — Progress Notes (Signed)
Pt. wanted stool softener and Dr. Nelson ChimesAmin notified earlier. Pt. to dialysis via bed at this time; no problems noted.

## 2016-12-09 NOTE — Anesthesia Preprocedure Evaluation (Signed)
Anesthesia Evaluation  Patient identified by MRN, date of birth, ID band Patient awake    Reviewed: Allergy & Precautions, NPO status , Patient's Chart, lab work & pertinent test results  Airway Mallampati: II  TM Distance: >3 FB     Dental   Pulmonary shortness of breath, pneumonia, Current Smoker,    breath sounds clear to auscultation       Cardiovascular hypertension,  Rhythm:Regular Rate:Normal     Neuro/Psych Anxiety Depression  Neuromuscular disease    GI/Hepatic GERD  ,  Endo/Other    Renal/GU Renal disease     Musculoskeletal  (+) Arthritis ,   Abdominal   Peds  Hematology  (+) anemia ,   Anesthesia Other Findings   Reproductive/Obstetrics                             Anesthesia Physical Anesthesia Plan  ASA: III  Anesthesia Plan: MAC   Post-op Pain Management:    Induction: Intravenous  Airway Management Planned: Mask and Simple Face Mask  Additional Equipment:   Intra-op Plan:   Post-operative Plan:   Informed Consent: I have reviewed the patients History and Physical, chart, labs and discussed the procedure including the risks, benefits and alternatives for the proposed anesthesia with the patient or authorized representative who has indicated his/her understanding and acceptance.   Dental advisory given  Plan Discussed with: CRNA and Anesthesiologist  Anesthesia Plan Comments:         Anesthesia Quick Evaluation

## 2016-12-09 NOTE — Anesthesia Postprocedure Evaluation (Signed)
Anesthesia Post Note  Patient: [name]  Procedure(s) Performed: Procedure(s) (LRB): ESOPHAGOGASTRODUODENOSCOPY (EGD) (N/A)  Patient location during evaluation: PACU Anesthesia Type: MAC Level of consciousness: awake Pain management: pain level controlled Respiratory status: spontaneous breathing Cardiovascular status: stable Anesthetic complications: no       Last Vitals:  Vitals:   12/09/16 1034 12/09/16 1133  BP: (!) 106/33 95/77  Pulse: 73   Resp: 16   Temp: 36.4 C 36.4 C    Last Pain:  Vitals:   12/09/16 1133  TempSrc: Oral  PainSc: 0-No pain                 Roye Gustafson

## 2016-12-09 NOTE — Brief Op Note (Signed)
12/07/2016 - 12/09/2016  11:35 AM  PATIENT:  Caleb BaldingJeffrey E Culbertson  51 y.o. male  PRE-OPERATIVE DIAGNOSIS:  GI bleed  POST-OPERATIVE DIAGNOSIS:  gastritis, duodenitis  PROCEDURE:  Procedure(s): ESOPHAGOGASTRODUODENOSCOPY (EGD) (N/A)  SURGEON:  Surgeon(s) and Role:    * Kathi DerParag Nyal Schachter, MD - Primary  Recommendations ------------------------- - EGD showed severe inflammation in the duodenal bulb as well as prepyloric area. Biopsies taken. - No evidence of active bleeding. - Change PPI to by mouth twice a day - Drop in hemoglobin noted, monitor H&H. - Recommend repeat EGD in 8-10 weeks to document healing of severe inflammation - GI will follow.

## 2016-12-09 NOTE — Op Note (Signed)
[facility] Patient Name: [name] Procedure Date : 12/09/2016 MRN: [id number] Attending MD: Kathi Der , MD Date of Birth: 1965/11/02 CSN: [id number] Age: 51 Admit Type: Inpatient Procedure:                Upper GI endoscopy Indications:              Coffee-ground emesis, Melena Providers:                Kathi Der, MD, Roselie Awkward, RN, Beryle Beams, Technician, Einar Crow, CRNA Referring MD:              Medicines:                Sedation Administered by an Anesthesia Professional Complications:            No immediate complications. Estimated Blood Loss:     Estimated blood loss was minimal. Procedure:                Pre-Anesthesia Assessment:                           - Prior to the procedure, a History and Physical                            was performed, and patient medications and                            allergies were reviewed. The patient's tolerance of                            previous anesthesia was also reviewed. The risks                            and benefits of the procedure and the sedation                            options and risks were discussed with the patient.                            All questions were answered, and informed consent                            was obtained. Prior Anticoagulants: The patient has                            taken no previous anticoagulant or antiplatelet                            agents. ASA Grade Assessment: III - A patient with                            severe systemic disease. After reviewing the risks  and benefits, the patient was deemed in                            satisfactory condition to undergo the procedure.                           After obtaining informed consent, the endoscope was                            passed under direct vision. Throughout the                            procedure, the patient's blood  pressure, pulse, and                            oxygen saturations were monitored continuously. The                            EG-[id number] ([id number]) scope was introduced through the                            mouth, and advanced to the second part of duodenum.                            The upper GI endoscopy was technically difficult                            and complex due to the patient's combativeness.                            Successful completion of the procedure was aided by                            increasing the dose of sedation medication. The                            patient tolerated the procedure. Scope In: Scope Out: Findings:      LA Grade A (one or more mucosal breaks less than 5 mm, not extending       between tops of 2 mucosal folds) esophagitis with no bleeding was found       at the gastroesophageal junction.      A few non-bleeding localized erosions were found in the cardia. There       were no stigmata of active bleeding. Biopsies were taken with a cold       forceps for Helicobacter pylori testing.      Localized moderate inflammation characterized by congestion (edema),       erythema, friability and granularity was found in the prepyloric region       of the stomach. thickened and edematousfold at pyloric channel. Biopsies       were taken with a cold forceps for histology.      Scattered severe inflammation characterized by congestion       (edema),thickening of folds, friability and granularity was found in the  duodenal bulb. Biopsies were taken with a cold forceps for histology.       scattered inflammation also noted in D1 and D2 Impression:               - LA Grade A reflux esophagitis.                           - Gastric erosions without bleeding. Biopsied.                           - Gastritis. Biopsied.                           - Duodenitis. Biopsied. Moderate Sedation:      Moderate (conscious) sedation was personally administered by an        anesthesia professional. The following parameters were monitored: oxygen       saturation, heart rate, blood pressure, and response to care. Recommendation:           - Return patient to hospital ward for ongoing care.                           - Full liquid diet.                           - Continue present medications.                           - Await pathology results.                           - No aspirin, ibuprofen, naproxen, or other                            non-steroidal anti-inflammatory drugs.                           - Repeat upper endoscopy in 8 weeks to check                            healing.                           - Return to my office in 6 weeks. Procedure Code(s):        --- Professional ---                           [phone], Esophagogastroduodenoscopy, flexible,                            transoral; with biopsy, single or multiple Diagnosis Code(s):        --- Professional ---                           K21.0, Gastro-esophageal reflux disease with                            esophagitis  K25.9, Gastric ulcer, unspecified as acute or                            chronic, without hemorrhage or perforation                           K29.70, Gastritis, unspecified, without bleeding                           K29.80, Duodenitis without bleeding                           K92.0, Hematemesis                           K92.1, Melena (includes Hematochezia) CPT copyright 2016 American Medical Association. All rights reserved. The codes documented in this report are preliminary and upon coder review may  be revised to meet current compliance requirements. Kathi DerParag Kareema Keitt, MD Kathi DerParag Asheton Scheffler, MD 12/09/2016 11:32:36 AM Number of Addenda: 0

## 2016-12-09 NOTE — Progress Notes (Signed)
Patient ID: Caleb BaldingJeffrey E Oyen, male   DOB: [birth year], 51 y.o.   MRN: [id number]  [facility] Progress Note    Subjective:   Feeling better   Objective:   BP (!) 111/39   Pulse 73   Temp 97.5 F (36.4 C) (Oral)   Resp 17   Ht 6' (1.829 m)   Wt 72.1 kg (159 lb)   SpO2 100%   BMI 21.56 kg/m   Intake/Output: I/O last 3 completed shifts: In: 1103.1 [P.O.:240; I.V.:863.1] Out: 0    Intake/Output this shift:  Total I/O In: 250 [I.V.:250] Out: -  Weight change: -2.722 kg (-6 lb)  Physical Exam: Gen:NAD CVS:no rub Resp:cta HYQ:MVHQIOAbd:benign Ext:no edema LUE AVG +T/B  Labs: BMET  Recent Labs Lab 12/07/16 2120 12/08/16 0554 12/09/16 0943  NA 137 136 135  K 4.6 4.9 5.2*  CL 92* 97* 95*  CO2 27 21* 21*  GLUCOSE 88 78 76  BUN 69* 84* 99*  CREATININE 10.39* 11.15* 13.31*  ALBUMIN 3.0*  --   --   CALCIUM 9.1 8.7* 8.3*   CBC  Recent Labs Lab 12/07/16 2120 12/08/16 0208 12/08/16 0554 12/09/16 0943  WBC 12.0*  --  13.7* 9.5  NEUTROABS 6.9  --   --   --   HGB 12.8*  --  11.2* 9.1*  HCT 39.6 33.3* 34.0* 27.4*  MCV 97.5  --  96.9 96.1  PLT 145*  --  122* 129*    @IMGRELPRIORS @ Medications:    . calcium acetate  2,668 mg Oral BID WC  . Darunavir Ethanolate  800 mg Oral Q supper  . dolutegravir  50 mg Oral QHS  . pantoprazole  40 mg Oral BID  . ritonavir  100 mg Oral Q supper  . sodium chloride flush  3 mL Intravenous Q12H  . tenofovir  300 mg Oral Q Sun   Dialysis Orders:  [facility]:45 [facility], BFR400/DFR800, EDW 71kg, 2K/2.5Ca bath, Profile #4, Linear Na, AVG - Heparin 3000 bolus q HD - Hectoral 4mcg IV q HD - No ESA   Assessment/ Plan:   1. GI bleed- EGD revealed duodenitis.  Started on PPI and follow H/H. 2. ESRD continue with HD qTTS no heparin for now.  3. ABLA on Anemia of chronic kidney disease: will start ESA and follow H/H and transfuse prn. 4. CKD-MBD: cont with binders and follow phos 5. Nutrition: renal  diet 6. Hypertension:stable  Irena CordsJoseph A. Craig Wisnewski, MD [facility], [facility] Pager [phone] 12/09/2016, 1:53 PM

## 2016-12-10 ENCOUNTER — Other Ambulatory Visit: Payer: Self-pay | Admitting: Family Medicine

## 2016-12-10 ENCOUNTER — Encounter (HOSPITAL_COMMUNITY): Payer: Self-pay | Admitting: Gastroenterology

## 2016-12-10 LAB — BASIC METABOLIC PANEL
ANION GAP: 19 — AB (ref 5–15)
BUN: 104 mg/dL — ABNORMAL HIGH (ref 6–20)
CHLORIDE: 98 mmol/L — AB (ref 101–111)
CO2: 20 mmol/L — AB (ref 22–32)
Calcium: 7.8 mg/dL — ABNORMAL LOW (ref 8.9–10.3)
Creatinine, Ser: 15.02 mg/dL — ABNORMAL HIGH (ref 0.61–1.24)
GFR calc Af Amer: 4 mL/min — ABNORMAL LOW (ref >60)
GFR calc non Af Amer: 3 mL/min — ABNORMAL LOW (ref >60)
GLUCOSE: 77 mg/dL (ref 65–99)
Potassium: 5.3 mmol/L — ABNORMAL HIGH (ref 3.5–5.1)
Sodium: 137 mmol/L (ref 135–145)

## 2016-12-10 LAB — CBC
HEMATOCRIT: 25.4 % — AB (ref 39.0–52.0)
HEMOGLOBIN: 8.4 g/dL — AB (ref 13.0–17.0)
MCH: 32.1 pg (ref 26.0–34.0)
MCHC: 33.1 g/dL (ref 30.0–36.0)
MCV: 96.9 fL (ref 78.0–100.0)
Platelets: 121 10*3/uL — ABNORMAL LOW (ref 150–400)
RBC: 2.62 MIL/uL — AB (ref 4.22–5.81)
RDW: 15.7 % — AB (ref 11.5–15.5)
WBC: 6.8 10*3/uL (ref 4.0–10.5)

## 2016-12-10 MED ORDER — NYSTATIN 100000 UNIT/GM EX POWD: Freq: Three times a day (TID) | CUTANEOUS | 0 refills | Status: DC

## 2016-12-10 MED ORDER — DOCUSATE SODIUM 100 MG PO CAPS: 100.0000 mg | ORAL_CAPSULE | Freq: Every day | ORAL | 0 refills | Status: AC

## 2016-12-10 MED ORDER — PANTOPRAZOLE SODIUM 40 MG PO TBEC: 40.0000 mg | DELAYED_RELEASE_TABLET | Freq: Two times a day (BID) | ORAL | 0 refills | Status: DC

## 2016-12-10 MED ORDER — DARBEPOETIN ALFA 60 MCG/0.3ML IJ SOSY: 60.0000 ug | PREFILLED_SYRINGE | INTRAMUSCULAR | Status: DC

## 2016-12-10 NOTE — Progress Notes (Signed)
Family Medicine Teaching Service (late entry from 3/14) Daily Progress Note Intern Pager: [phone]  Patient name: [name] Medical record number: [id number] Date of birth: 1965/12/06 Age: 51 y.o. Gender: male  Primary Care Provider: Almon Hercules, MD Consultants: Gastroenterology Code Status: Full code  Pt Overview and Major Events to Date:  [name] is a 51 y.o. male presenting with coffee ground emesis and melena, likely upper GI bleed. 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  Coffee Ground Emesis / Melena / Abdominal Pain: EGD revealed reflux esophagitis, gastric erosions without bleeding, gastritis and duodenitis. GI recommended increasing PPI to BID, avoiding NSAIDs and monitoring H/H.  Hgb stable at 8.9 from 9.1.  - GI consulted; avoid NSAIDs, follow up for repeat EGD in 8 weeks to assess healing - Protonix BID - Zofran prn for nausea - RUQ US- showed cholethiasis  - hepatitis panel- negative - anti-smooth muscle abs/ANA/mitrochondial abs: negative  Acute Encephalopathy, resolved:  - continue to monitor neuro status  Hypotension: BPs in the 100s-120s systolics. Appears to be baseline for patient. BP to 93/34 this AM.  - Continue to monitor  HIV: Followed by Dr. Cliffton [name]. CD4 990 and viral load 250 - Continue home Norvir 100mg  qhs, Prezista 800mg  qhs, Tivicay 50mg  daily, Viread 300mg  (taken once a week on Sundays- given on admission 3/11)  ESRD on HD: ESRD 2/2 ADPCKD. Receives HD on T/Th/Sat..  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 - Nicotine patch prn  Left 4th and 5th toe ulcers: No fevers or leukocytosis. XR showed no soft tissue abscess evident by radiography and extensive  arterial vascular calcification - wound care  FEN/GI: NPO due to AMS Prophylaxis: SCDs with active GI bleed  Disposition: DC home and will get dialysis today  Subjective:  Feels well this morning. Only complaint is pain in his 4th and 4th L toe. Denies CP, SOB, abd pain, NV or diarrhea.    Objective: Temp:  [97.4 F (36.3 C)-98 F (36.7 C)] 98 F (36.7 C) (03/14 0532) Pulse Rate:  [67-94] 70 (03/14 0532) Resp:  [16-18] 17 (03/14 0532) BP: (87-114)/(29-77) 94/34 (03/14 0532) SpO2:  [95 %-100 %] 97 % (03/14 0532) Weight:  [147 lb 0.8 oz (66.7 kg)-159 lb (72.1 kg)] 159 lb (72.1 kg) (03/14 0030) Physical Exam: General: 51 year old male sitting up in bed appearing uncomfortable but in no acute distress Cardiovascular: Regular rate and rhythm, no murmurs rubs or gallops Respiratory: No increased work of breathing, clear to auscultation bilaterally, no wheezing or rhonchi Abdomen: Soft, moderately tender epigastrium, right upper quadrant without Murphy's sign and right lower quadrant without peritoneal signs Extremities: Warm and well-perfused, no edema Psych: Appears somnolent, but with normal mood and affect  Laboratory:  Recent Labs Lab 12/09/16 0943 12/09/16 1900 12/10/16 0721  WBC 9.5 8.7 6.8  HGB 9.1* 8.9* 8.4*  HCT 27.4* 27.4* 25.4*  PLT 129* 128* 121*    Recent Labs Lab 12/07/16 2120 12/08/16 0554 12/09/16 0943  NA 137 136 135  K 4.6 4.9 5.2*  CL 92* 97* 95*  CO2 27 21* 21*  BUN 69* 84* 99*  CREATININE 10.39* 11.15* 13.31*  CALCIUM 9.1 8.7* 8.3*  PROT 7.9  --   --  BILITOT 0.6  --   --   ALKPHOS 261*  --   --   ALT 25  --   --   AST 20  --   --   GLUCOSE 88 78 76    Imaging/Diagnostic Tests: Dg Foot 2 Views Left  Result Date: 12/09/2016 CLINICAL DATA:  Pain laterally with swelling and soft tissue ulceration. End-stage renal disease. EXAM: LEFT FOOT - 2 VIEW COMPARISON:  None. FINDINGS: Frontal and lateral views obtained. No evident fracture or  dislocation. There is extensive arterial vascular calcification. Joint spaces appear normal. No erosive change or bony destruction. No soft tissue abscess evident radiographically. IMPRESSION: Extensive arterial vascular calcification. No fracture or dislocation. No erosive change or bony destruction. No appreciable arthropathic change. No soft tissue abscess evident by radiography. Electronically Signed   By: Bretta BangWilliam  Woodruff III M.D.   On: 12/09/2016 14:58   [place] Abdomen Limited Ruq  Result Date: 12/08/2016 CLINICAL DATA:  Abnormal LFTs EXAM: US ABDOMEN LIMITED - RIGHT UPPER QUADRANT COMPARISON:  CT 10/13/2015 FINDINGS: Gallbladder: Calcified mobile gallstone measuring up to 1.6 cm. Negative sonographic Murphy's. Normal wall thickness. Common bile duct: Diameter: Normal at 3.7 mm Liver: No focal lesion identified. Within normal limits in parenchymal echogenicity. Incidentally noted is an echogenic right kidney containing multiple cysts. IMPRESSION: 1. Cholelithiasis without sonographic evidence for acute cholecystitis or biliary dilatation 2. Echogenic right kidney containing multiple cysts Electronically Signed   By: Jasmine PangKim  Fujinaga M.D.   On: 12/08/2016 19:56    Renne Muscaaniel L Jasaun Carn, MD 12/10/2016, 8:17 AM PGY-1, [facility] FPTS Intern pager: [phone], text pages welcome

## 2016-12-10 NOTE — Progress Notes (Signed)
Patient ID: [name], male   DOB: 10-25-1965, 51 y.o.   MRN: [id number]  [facility] Progress Note    Subjective:   Trouble with cannulation during HD last night and did not want to be restuck s/o after 16 mins/ Recent AVG revision in Feb For discharge today per primary. Will have HD at [facility] this afternoon    Objective:   BP (!) 94/34   Pulse 70   Temp 98 F (36.7 C)   Resp 17   Ht 6' (1.829 m)   Wt 72.1 kg (159 lb)   SpO2 97%   BMI 21.56 kg/m   Intake/Output: I/O last 3 completed shifts: In: 1735.6 [P.O.:600; I.V.:1135.6] Out: -457    Intake/Output this shift:  Total I/O In: 240 [P.O.:240] Out: 0  Weight change: -5.422 kg (-11 lb 15.3 oz)  Physical Exam: Gen:NAD CVS:no rub Resp:cta JXB:JYNWGN Ext:no edema LUE AVG +T/B  Labs: BMET  Recent Labs Lab 12/07/16 2120 12/08/16 0554 12/09/16 0943 12/10/16 0721  NA 137 136 135 137  K 4.6 4.9 5.2* 5.3*  CL 92* 97* 95* 98*  CO2 27 21* 21* 20*  GLUCOSE 88 78 76 77  BUN 69* 84* 99* 104*  CREATININE 10.39* 11.15* 13.31* 15.02*  ALBUMIN 3.0*  --   --   --   CALCIUM 9.1 8.7* 8.3* 7.8*   CBC  Recent Labs Lab 12/07/16 2120  12/08/16 0554 12/09/16 0943 12/09/16 1900 12/10/16 0721  WBC 12.0*  --  13.7* 9.5 8.7 6.8  NEUTROABS 6.9  --   --   --   --   --   HGB 12.8*  --  11.2* 9.1* 8.9* 8.4*  HCT 39.6  < > 34.0* 27.4* 27.4* 25.4*  MCV 97.5  --  96.9 96.1 96.5 96.9  PLT 145*  --  122* 129* 128* 121*  < > = values in this interval not displayed.  @IMGRELPRIORS @ Medications:    . calcium acetate  2,668 mg Oral BID WC  . [START ON 12/11/2016] darbepoetin (ARANESP) injection - DIALYSIS  60 mcg Intravenous Q Thu-HD  . Darunavir Ethanolate  800 mg Oral Q supper  . docusate sodium  100 mg Oral Daily  . dolutegravir  50 mg Oral QHS  . pantoprazole  40 mg Oral BID  . ritonavir  100 mg Oral Q supper  . sodium chloride flush  3 mL Intravenous Q12H  . tenofovir  300 mg Oral Q Sun    Dialysis Orders:  [facility] 3:45 hours, BFR400/DFR800, EDW 71kg, 2K/2.5Ca bath, Profile #4, Linear Na, AVG - Heparin 3000 bolus q HD - Hectoral IV q HD - No ESA   Assessment/ Plan:   1. GI bleed- EGD revealed duodenitis.  Started on PPI and follow H/H. 2. ESRD continue with HD qTTS no heparin for now. / HD off schedule at [facility] center today d/t early s/o yesterday  3. ABLA on Anemia of chronic kidney disease: Hgb 8.4  will start ESA and follow H/H and transfuse prn. 4. CKD-MBD: cont with binders and follow phos 5. Nutrition: renal diet 6. Hypertension:stable  Tomasa Blase PA-C [facility] Pager [phone] 12/10/2016,10:01 AM  I have seen and examined this patient and agree with plan and assessment in the above note with renal recommendations/intervention highlighted.  Mr. Jent continues to s/o early with issues regarding his AVG despite multiple attempts to have him remain compliant, will need to refer for a new access  to be placed as he cannot tolerate HD with current avg.  Will pursue this as an outpatient. Jomarie LongsJoseph A Abigaelle Verley,MD 12/10/2016 2:22 PM

## 2016-12-10 NOTE — Discharge Instructions (Signed)
You were admitted for abdominal pain, nausea with bloody vomit and blood in your stool.  The gastroenterologists found some inflammation on your scope, but no active bleeding.  You will now be taking protonix (acid medicine) two times daily.  Please avoid aspirin, ibuprofen, aleve and other medicines like this.  Tylenol is OK.  For your toe ulcers, I have prescribed a powder that you can put on your toes 3 times a day for 14 days.    Please follow up with your primary doctor and go to dialysis today.

## 2016-12-10 NOTE — Progress Notes (Signed)
[facility] Progress Note  Caleb BaldingJeffrey E [name] 51 y.o. [birth year]  CC:  GI bleed   Subjective:  Patient is feeling better. Abdominal pain resolved. Bleeding stopped. Tolerating diet.  ROS : Negative for nausea and vomiting. Negative for active bleeding   Objective: Vital signs in last 24 hours: Vitals:   12/09/16 2349 12/10/16 0532  BP: (!) 114/51 (!) 94/34  Pulse: 74 70  Resp: 18 17  Temp: 97.4 F (36.3 C) 98 F (36.7 C)    Physical Exam:  General:   Alert,  Well-developed, well-nourished, pleasant and cooperative in NAD HEENT : NS, AT, EOMI Neck supple. Trachea midline. Sclera - anicteric Lungs:  Clear throughout to auscultation.   No wheezes, crackles, or rhonchi. No acute distress. Heart:  Regular rate and rhythm; no murmurs, clicks, rubs,  or gallops. Abdomen: Nontender soft, mild distended, bowel sounds present. No peritoneal sign. LE: no edema  Psych : Mood and affect normal. Alert oriented 3   Lab Results:  Recent Labs  12/08/16 0554 12/09/16 0943  NA 136 135  K 4.9 5.2*  CL 97* 95*  CO2 21* 21*  GLUCOSE 78 76  BUN 84* 99*  CREATININE 11.15* 13.31*  CALCIUM 8.7* 8.3*    Recent Labs  12/07/16 2120  AST 20  ALT 25  ALKPHOS 261*  BILITOT 0.6  PROT 7.9  ALBUMIN 3.0*    Recent Labs  12/07/16 2120  12/09/16 1900 12/10/16 0721  WBC 12.0*  < > 8.7 6.8  NEUTROABS 6.9  --   --   --   HGB 12.8*  < > 8.9* 8.4*  HCT 39.6  < > 27.4* 25.4*  MCV 97.5  < > 96.5 96.9  PLT 145*  < > 128* 121*  < > = values in this interval not displayed.  Recent Labs  12/07/16 2120  LABPROT 13.9  INR 1.07      Assessment/Plan: - Melena and coffee-ground emesis. EGD yesterday showed gastritis and duodenitis. No discrete ulcer. - Mild drop in hemoglobin. No active bleeding at this point - Occult blood positive stool - End Stage renal disease on hemodialysis - Thrombocytopenia. - Elevated alkaline  phosphatase.  Recommendations ------------------------ - Recommend twice a day PPI for the next 8 weeks. Avoid NSAIDs. - Negative hepatitis panel, AMA , ANA , ASMA. Iron studies unremarkable. Ultrasound negative. - Recommend outpatient colonoscopy once acute issues are resolved for occult blood positive stool. - Okay to discharge from GI standpoint. - GI will signoff  Call us back if needed   Kathi DerParag Jakaylee Sasaki MD, FACP 12/10/2016, 8:51 AM  Pager [phone]  If no answer or after 5 PM call [phone]

## 2016-12-10 NOTE — Progress Notes (Signed)
Discharge instructions and medications discussed with patient and wife.  All questions answered.  

## 2016-12-13 NOTE — Discharge Summary (Signed)
[facility] Discharge Summary  Patient name: [name] Medical record number: [id number] Date of birth: May 22, 1966 Age: 51 y.o. Gender: male Date of Admission: 12/07/2016  Date of Discharge: 12/10/16 Admitting Physician: Alveda Reasons, MD  Primary Care Provider: Mercy Riding, MD Consultants: Nephrology, Gastroenterology  Indication for Hospitalization: coffee ground emesis  Discharge Diagnoses/Problem List:  Esophagitis/duodenitis ESRD Hypotension HIV Left 4th and 5th toe ulcer Tobacco use  Disposition: Discharge home  Discharge Condition: Stable, improved  Discharge Exam:  General: 51 year old male sitting on side of bed appearing comfortable Cardiovascular: Regular rate and rhythm, no murmurs rubs or gallops Respiratory: No increased work of breathing, clear to auscultation bilaterally, no wheezing or rhonchi Abdomen: Soft, mildly tender epigastrium Extremities: Warm and well-perfused, no edema Psych: Normal mood and affect  Brief Hospital Course:  Patient presented to [facility] after episodes of coffee ground emesis and blood stool and changes in mental status. Had pos FOBT in ED. Alk phos and GGT elevated. Had CT head to investigate AMS and this was negative, labs were significant only for elevated ammonia to 53 and BUN to the 80s. Consulted nephrology who performed HD on his regular scheduled days. Also consulted GI who performed EGD and found duodenitis and gastritis without active bleeding. Also had a RUQ [place] that showed cholelithiasis without obstruction or acute cholecystitis, as well as echogenic right kidney with multiple cysts. Hgb did trend downward during admission, but did not reach a threshold for transfusion.  Patient also with ulcers at 4th and 5th toes with interdigital web ulcer right close to the tip of the 5th toe and in mod pain. Had xray that ruled out osteomyelitis. This was likely fungal and decided to treat with course of  nystatin powder. At the time of discharge patient was AAOx3 and pain was improved and he was breathing comfortably on room air and hgb was stable.  He was due to receive dialysis on the day of discharge and we scheduled a dialysis visit for the morning of discharge at his outpatient unit.   Issues for Follow Up:  1. Coffee ground emesis/melena: EGD showed duodenitis and gastritis. Has follow up with GI scheduled for 6 weeks and plan for 8 week repeat EGD. Recommended to avoid NSAIDS and continue protonix BID. Consider f/u Hgb 2. HIV: continue home medication 3. ESRD: continue dialysis 4. Foot ulcers: continue nystatin powder 5. Hypotension: discharged at baseline BP. F/u at clinic visit 6. AMS: Resolved during admission, but if continued consider repeating ammonia  Significant Procedures: EDG, CT head, HD, RUQ [place]  Significant Labs and Imaging:   Recent Labs Lab 12/09/16 0943 12/09/16 1900 12/10/16 0721  WBC 9.5 8.7 6.8  HGB 9.1* 8.9* 8.4*  HCT 27.4* 27.4* 25.4*  PLT 129* 128* 121*    Recent Labs Lab 12/07/16 2120 12/08/16 0554 12/09/16 0943 12/10/16 0721  NA 137 136 135 137  K 4.6 4.9 5.2* 5.3*  CL 92* 97* 95* 98*  CO2 27 21* 21* 20*  GLUCOSE 88 78 76 77  BUN 69* 84* 99* 104*  CREATININE 10.39* 11.15* 13.31* 15.02*  CALCIUM 9.1 8.7* 8.3* 7.8*  ALKPHOS 261*  --   --   --   AST 20  --   --   --   ALT 25  --   --   --   ALBUMIN 3.0*  --   --   --     EGD: - EGD showed severe inflammation in the duodenal  bulb as well as prepyloric area. Biopsies taken. - No evidence of active bleeding. - Change PPI to by mouth twice a day - Drop in hemoglobin noted, monitor H&H. - Recommend repeat EGD in 8-10 weeks to document healing of severe inflammation - GI will follow.  CT head:  IMPRESSION: Diffuse cerebral atrophy. Parenchyma wall and meningeal calcifications could represent dystrophic change or old inflammatory process. Low-attenuation changes in the occipital lobes  could represent old infarcts or possibly posterior reversible encephalopathy syndrome. Consider MRI if clinically indicated.  RUQ [place]: IMPRESSION: 1. Cholelithiasis without sonographic evidence for acute cholecystitis or biliary dilatation 2. Echogenic right kidney containing multiple cysts  Results/Tests Pending at Time of Discharge:  None  Discharge Medications:  Allergies as of 12/10/2016      Reactions   Penicillins Hives, Other (See Comments)   Rx required Hospitalization Has patient had a PCN reaction causing immediate rash, facial/tongue/throat swelling, SOB or lightheadedness with hypotension: Yes Has patient had a PCN reaction causing severe rash involving mucus membranes or skin necrosis: No Has patient had a PCN reaction that required hospitalization Yes Has patient had a PCN reaction occurring within the last 10 years: No   Contrast Media [iodinated Diagnostic Agents] Hives, Itching   Acetaminophen-codeine Nausea And Vomiting   Tape Rash   Use paper tape only      Medication List    STOP taking these medications   pantoprazole 40 MG tablet Commonly known as:  PROTONIX     TAKE these medications   acetaminophen 500 MG tablet Commonly known as:  TYLENOL Take 1,000 mg by mouth every 6 (six) hours as needed for headache (pain).   albuterol 108 (90 Base) MCG/ACT inhaler Commonly known as:  PROVENTIL HFA;VENTOLIN HFA Inhale 2 puffs into the lungs every 6 (six) hours as needed for wheezing or shortness of breath.   calcium acetate 667 MG capsule Commonly known as:  PHOSLO Take 2,668 mg by mouth 2 (two) times daily with a meal.   docusate sodium 100 MG capsule Commonly known as:  COLACE Take 1 capsule (100 mg total) by mouth daily.   dolutegravir 50 MG tablet Commonly known as:  TIVICAY Take 1 tablet (50 mg total) by mouth daily. What changed:  when to take this   NORVIR 100 MG Tabs tablet Generic drug:  ritonavir TAKE 1 TABLET BY MOUTH AT BEDTIME. TAKE WITH  PREZISTA TABLET   nystatin powder Commonly known as:  MYCOSTATIN/NYSTOP Apply topically 3 (three) times daily. For 14 days   PREZISTA 800 MG tablet Generic drug:  darunavir TAKE 1 TABLET BY MOUTH AT BEDTIME WITH NORVIR   valACYclovir 500 MG tablet Commonly known as:  VALTREX Take 1 tablet (500 mg total) by mouth daily as needed (herpes outbreaks).   VIREAD 300 MG tablet Generic drug:  tenofovir TAKE 1 TABLET BY MOUTH ONCE A WEEK ON SUNDAYS What changed:  See the new instructions.       Discharge Instructions: Please refer to Patient Instructions section of EMR for full details.  Patient was counseled important signs and symptoms that should prompt return to medical care, changes in medications, dietary instructions, activity restrictions, and follow up appointments.   Follow-Up Appointments: Follow-up Information    Eloise Levels, MD. Go on 12/18/2016.   Why:  hospital follow up at 3:30PM Contact information: [place] [address] [phone]           Eloise Levels, MD 12/13/2016, 9:22 AM PGY-1, [facility]

## 2016-12-17 ENCOUNTER — Encounter: Payer: Self-pay | Admitting: Vascular Surgery

## 2016-12-17 ENCOUNTER — Ambulatory Visit (INDEPENDENT_AMBULATORY_CARE_PROVIDER_SITE_OTHER): Payer: Medicare Other | Admitting: Vascular Surgery

## 2016-12-17 VITALS — BP 96/59 | HR 70 | Resp 21 | Ht 72.0 in | Wt 161.0 lb

## 2016-12-17 DIAGNOSIS — Z992 Dependence on renal dialysis: Secondary | ICD-10-CM | POA: Diagnosis not present

## 2016-12-17 DIAGNOSIS — N186 End stage renal disease: Secondary | ICD-10-CM

## 2016-12-17 NOTE — Progress Notes (Signed)
Patient name: Caleb BaldingJeffrey E Merwin MRN: [id number] DOB: 1966-06-29 Sex: male  REASON FOR VISIT: Complains of access site pain. Referred by Dr. Arrie Aranoladonato.   HPI: [name] is a 51 y.o. male who had a left upper arm graft placed by Dr. Leonette Mostharles fields on 03/17/2016. The venous anastomosis was end-to-side to the axillary vein.   I did review the records that were sent with him. The patient had a fistulogram by [facility] interventional radiology on 11/19/2016. 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 The patient had successful recannulization and stent placement of the left subclavian vein occlusion. Also successful venoplasty of the anastomotic stenosis.  The patient states that he has been having some pain in the upper part of his graft during dialysis. He states that they are using a tourniquet which causes him discomfort. He tells me that the graft has been working well.  Past Medical History:  Diagnosis Date  . Anal condyloma   . Anemia   . Diarrhea    occurs because of dialysis  . DJD (degenerative joint disease)    right knee.  All joints  . ESRD (end stage renal disease) on dialysis (HCC) 01/02/2012   [facility] [address], TTS schedule.  Started dialysis in 2000.     . Family history of disseminated HSV infection   . Genital herpes   . Hemodialysis patient [facility](HCC)    Tuesday, Thursday, Saturday  . History of blood transfusion   . HIV infection (HCC)   . Hypertension   . Pneumonia 1/20107   hx of  . Shortness of breath    "can happen at anytime" (03/04/2013).  03/14/16- "when I have too much fluid"  . Tertiary syphilis   . 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 Reclotted on 03/02/13 and IR placed L IJ tunneled HD catheter (a right external jugular tunneled HD cath was attempted first but did not function due to kinking across the  clavicle). Pt was seen by [facility] on same admission 6/614 and said RUA AVF no longer usable, they ordered vein mapping and will see him as outpatient to set up next permanent access.       Family History  Problem Relation Age of Onset  . Diabetes Mother   . Arthritis Father     Gout- Foot    SOCIAL HISTORY: Social History  Substance Use Topics  . Smoking status: Heavy Tobacco Smoker    Packs/day: 0.50    Years: 27.00    Types: Cigarettes  . Smokeless tobacco: Never Used     Comment: cutting back from last visit  . Alcohol use No     Comment: 03/04/2013 "stopped drinking ~ 12 yr ago; never had problem w/it".      Allergies  Allergen Reactions  . Penicillins Hives and Other (See Comments)    Rx required Hospitalization Has patient had a PCN reaction causing immediate rash, facial/tongue/throat swelling, SOB or lightheadedness with hypotension: Yes Has patient had a PCN reaction causing severe rash involving mucus membranes or skin necrosis: No Has patient had a PCN reaction that required hospitalization Yes Has patient had a PCN reaction occurring within the last 10 years: No   . Contrast Media [Iodinated Diagnostic Agents] Hives and Itching  . Acetaminophen-Codeine Nausea And Vomiting  . Tape Rash    Use paper tape only    Current  Outpatient Prescriptions  Medication Sig Dispense Refill  . acetaminophen (TYLENOL) 500 MG tablet Take 1,000 mg by mouth every 6 (six) hours as needed for headache (pain).    Marland Kitchen albuterol (PROVENTIL HFA;VENTOLIN HFA) 108 (90 Base) MCG/ACT inhaler Inhale 2 puffs into the lungs every 6 (six) hours as needed for wheezing or shortness of breath.    . calcium acetate (PHOSLO) 667 MG capsule Take 2,668 mg by mouth 2 (two) times daily with a meal.     . docusate sodium (COLACE) 100 MG capsule Take 1 capsule (100 mg total) by mouth daily. 10 capsule 0  . dolutegravir (TIVICAY) 50 MG tablet Take 1 tablet (50 mg total) by mouth daily. (Patient taking differently:  Take 50 mg by mouth at bedtime. ) 30 tablet 11  . NORVIR 100 MG TABS tablet TAKE 1 TABLET BY MOUTH AT BEDTIME. TAKE WITH PREZISTA TABLET 30 tablet 11  . nystatin (MYCOSTATIN/NYSTOP) powder Apply topically 3 (three) times daily. For 14 days 15 g 0  . pantoprazole (PROTONIX) 40 MG tablet TAKE 1 TABLET BY MOUTH TWICE DAILY 180 tablet 0  . PREZISTA 800 MG tablet TAKE 1 TABLET BY MOUTH AT BEDTIME WITH NORVIR 90 tablet 3  . valACYclovir (VALTREX) 500 MG tablet Take 1 tablet (500 mg total) by mouth daily as needed (herpes outbreaks). 30 tablet 11  . VIREAD 300 MG tablet TAKE 1 TABLET BY MOUTH ONCE A WEEK ON SUNDAYS (Patient taking differently: TAKE 1 TABLET BY MOUTH ONCE A WEEK ON SUNDAYS AT BEDTIME) 4 tablet 11   No current facility-administered medications for this visit.     REVIEW OF SYSTEMS:  [X]  denotes positive finding, [ ]  denotes negative finding Cardiac  Comments:  Chest pain or chest pressure:    Shortness of breath upon exertion:    Short of breath when lying flat:    Irregular heart rhythm:        Vascular    Pain in calf, thigh, or hip brought on by ambulation:    Pain in feet at night that wakes you up from your sleep:     Blood clot in your veins:    Leg swelling:         Pulmonary    Oxygen at home:    Productive cough:     Wheezing:         Neurologic    Sudden weakness in arms or legs:     Sudden numbness in arms or legs:     Sudden onset of difficulty speaking or slurred speech:    Temporary loss of vision in one eye:     Problems with dizziness:         Gastrointestinal    Blood in stool:  X   Vomited blood:         Genitourinary    Burning when urinating:     Blood in urine:        Psychiatric    Major depression:         Hematologic    Bleeding problems:    Problems with blood clotting too easily:        Skin    Rashes or ulcers:        Constitutional    Fever or chills:      PHYSICAL EXAM: Vitals:   12/17/16 1445  BP: (!) 96/59  Pulse:  70  Resp: (!) 21  SpO2: 100%  Weight: 161 lb (73 kg)  Height: 6' (1.829 m)  GENERAL: The patient is a well-nourished male, in no acute distress. The vital signs are documented above. CARDIAC: There is a regular rate and rhythm.  VASCULAR: His left upper arm graft has a good thrill and bruit. PULMONARY: There is good air exchange bilaterally without wheezing or rales. SKIN: There are no ulcers or rashes noted. PSYCHIATRIC: The patient has a normal affect.  MEDICAL ISSUES:  PAIN [name]: I do not see any problems with his graft on exam. It has a good thrill and bruit. It is not especially pulsatile. The interventional radiologists at [facility] did an excellent job of opening up the occluded left subclavian vein. A stent was placed which will likely prohibit further attempts at surgical revision of his left upper arm graft. I have instructed him to not let them use the tourniquet. We have also discussed the importance of rotating the cannulation sites. I will see him back as needed.    Waverly Ferrari [facility] [phone]

## 2016-12-18 ENCOUNTER — Ambulatory Visit (INDEPENDENT_AMBULATORY_CARE_PROVIDER_SITE_OTHER): Payer: Medicare Other | Admitting: Family Medicine

## 2016-12-18 ENCOUNTER — Encounter: Payer: Self-pay | Admitting: Family Medicine

## 2016-12-18 VITALS — BP 93/43 | HR 78 | Ht 72.0 in | Wt 159.0 lb

## 2016-12-18 DIAGNOSIS — K920 Hematemesis: Secondary | ICD-10-CM | POA: Diagnosis present

## 2016-12-18 NOTE — Patient Instructions (Signed)
Thank you so much for coming to visit today! We will check your blood and electrolyte levels today! Please let us know if you develop any further blood in your vomit or bowel movements. Please follow up with GI as scheduled. Please follow up with your PCP.  Dr. Caroleen Hammanumley

## 2016-12-19 ENCOUNTER — Encounter: Payer: Self-pay | Admitting: Family Medicine

## 2016-12-19 LAB — CBC
HEMATOCRIT: 25.7 % — AB (ref 37.5–51.0)
HEMOGLOBIN: 8.2 g/dL — AB (ref 13.0–17.7)
MCH: 31.2 pg (ref 26.6–33.0)
MCHC: 31.9 g/dL (ref 31.5–35.7)
MCV: 98 fL — AB (ref 79–97)
Platelets: 124 10*3/uL — ABNORMAL LOW (ref 150–379)
RBC: 2.63 x10E6/uL — AB (ref 4.14–5.80)
RDW: 16.7 % — ABNORMAL HIGH (ref 12.3–15.4)
WBC: 6.4 10*3/uL (ref 3.4–10.8)

## 2016-12-19 LAB — BASIC METABOLIC PANEL
BUN / CREAT RATIO: 2 — AB (ref 9–20)
BUN: 15 mg/dL (ref 6–24)
CO2: 24 mmol/L (ref 18–29)
CREATININE: 6.19 mg/dL — AB (ref 0.76–1.27)
Calcium: 8.6 mg/dL — ABNORMAL LOW (ref 8.7–10.2)
Chloride: 96 mmol/L (ref 96–106)
GFR calc non Af Amer: 10 mL/min/{1.73_m2} — ABNORMAL LOW (ref >59)
GFR, EST AFRICAN AMERICAN: 11 mL/min/{1.73_m2} — AB (ref >59)
Glucose: 76 mg/dL (ref 65–99)
Potassium: 4.2 mmol/L (ref 3.5–5.2)
Sodium: 142 mmol/L (ref 134–144)

## 2016-12-21 NOTE — Progress Notes (Signed)
Subjective:     Patient ID: Caleb BaldingJeffrey E [name], male   DOB: September 20, 1966, 51 y.o.   MRN: [id number]  HPI [name] is a 51yo male presenting today for hospital follow up. Hospitalized from 3/11 until 3/14 for coffee ground emesis and melena. GI consulted and endoscopy performed, showing duodenitis and gastritis without active bleeding. Right upper quadrant ultrasound also showed cholelithiasis.   Reports since discharge, [name] has not had any further episodes of emesis or blood stools. Denies dizziness, fatigue, or shortness of breath. Has follow up with GI scheduled in 6 weeks with endoscopy planned for 8 weeks from now. Has continued to use Protonix twice daily as instructed. Continues to smoke.  Review of Systems Per HPI    Objective:   Physical Exam  Constitutional: [name] appears well-developed and well-nourished. No distress.  Eyes:  Pink conjunctiva  Cardiovascular: Normal rate and regular rhythm.   No murmur heard. Pulmonary/Chest: Effort normal. No respiratory distress. [name] has no wheezes.  Abdominal: Soft. [name] exhibits no distension. There is no tenderness.  Musculoskeletal: [name] exhibits no edema.  Skin: No rash noted.  Psychiatric: [name] has a normal mood and affect. His behavior is normal.      Assessment and Plan:     1. Coffee ground emesis Resolved. Continue Protonix as prescribed. Will check BMP and CBC today. Follow up with GI as scheduled. Notify office if further episodes of coffee ground emesis and melena occur. Follow up with PCP.

## 2016-12-24 ENCOUNTER — Ambulatory Visit: Payer: Medicare Other | Admitting: Vascular Surgery

## 2016-12-26 ENCOUNTER — Other Ambulatory Visit: Payer: Self-pay | Admitting: Internal Medicine

## 2016-12-26 DIAGNOSIS — B20 Human immunodeficiency virus [HIV] disease: Secondary | ICD-10-CM

## 2016-12-29 ENCOUNTER — Other Ambulatory Visit: Payer: Self-pay | Admitting: *Deleted

## 2016-12-29 DIAGNOSIS — B20 Human immunodeficiency virus [HIV] disease: Secondary | ICD-10-CM

## 2016-12-29 MED ORDER — DOLUTEGRAVIR SODIUM 50 MG PO TABS: 50.0000 mg | ORAL_TABLET | Freq: Every day | ORAL | 5 refills | Status: DC

## 2016-12-29 MED ORDER — RITONAVIR 100 MG PO TABS: ORAL_TABLET | ORAL | 5 refills | Status: DC

## 2016-12-29 MED ORDER — DARUNAVIR ETHANOLATE 800 MG PO TABS: ORAL_TABLET | ORAL | 5 refills | Status: DC

## 2016-12-29 MED ORDER — TENOFOVIR DISOPROXIL FUMARATE 300 MG PO TABS: ORAL_TABLET | ORAL | 5 refills | Status: DC

## 2017-01-01 ENCOUNTER — Encounter (HOSPITAL_COMMUNITY): Payer: Self-pay

## 2017-01-01 ENCOUNTER — Inpatient Hospital Stay (HOSPITAL_COMMUNITY)
Admission: EM | Admit: 2017-01-01 | Discharge: 2017-01-08 | DRG: 377 | Disposition: A | Discharge status: Home / Self Care | Payer: Medicare Other | Attending: Family Medicine | Admitting: Family Medicine

## 2017-01-01 DIAGNOSIS — Z880 Allergy status to penicillin: Secondary | ICD-10-CM

## 2017-01-01 DIAGNOSIS — I953 Hypotension of hemodialysis: Secondary | ICD-10-CM | POA: Diagnosis present

## 2017-01-01 DIAGNOSIS — K210 Gastro-esophageal reflux disease with esophagitis: Secondary | ICD-10-CM | POA: Diagnosis present

## 2017-01-01 DIAGNOSIS — Z833 Family history of diabetes mellitus: Secondary | ICD-10-CM

## 2017-01-01 DIAGNOSIS — D649 Anemia, unspecified: Secondary | ICD-10-CM | POA: Diagnosis present

## 2017-01-01 DIAGNOSIS — Z86718 Personal history of other venous thrombosis and embolism: Secondary | ICD-10-CM

## 2017-01-01 DIAGNOSIS — E538 Deficiency of other specified B group vitamins: Secondary | ICD-10-CM | POA: Diagnosis present

## 2017-01-01 DIAGNOSIS — R55 Syncope and collapse: Secondary | ICD-10-CM

## 2017-01-01 DIAGNOSIS — Z8261 Family history of arthritis: Secondary | ICD-10-CM

## 2017-01-01 DIAGNOSIS — E8889 Other specified metabolic disorders: Secondary | ICD-10-CM | POA: Diagnosis present

## 2017-01-01 DIAGNOSIS — K921 Melena: Principal | ICD-10-CM | POA: Diagnosis present

## 2017-01-01 DIAGNOSIS — A529 Late syphilis, unspecified: Secondary | ICD-10-CM | POA: Diagnosis present

## 2017-01-01 DIAGNOSIS — D539 Nutritional anemia, unspecified: Secondary | ICD-10-CM | POA: Diagnosis present

## 2017-01-01 DIAGNOSIS — N186 End stage renal disease: Secondary | ICD-10-CM | POA: Diagnosis present

## 2017-01-01 DIAGNOSIS — N2581 Secondary hyperparathyroidism of renal origin: Secondary | ICD-10-CM | POA: Diagnosis present

## 2017-01-01 DIAGNOSIS — Z79899 Other long term (current) drug therapy: Secondary | ICD-10-CM

## 2017-01-01 DIAGNOSIS — K922 Gastrointestinal hemorrhage, unspecified: Secondary | ICD-10-CM

## 2017-01-01 DIAGNOSIS — Z831 Family history of other infectious and parasitic diseases: Secondary | ICD-10-CM

## 2017-01-01 DIAGNOSIS — I120 Hypertensive chronic kidney disease with stage 5 chronic kidney disease or end stage renal disease: Secondary | ICD-10-CM | POA: Diagnosis present

## 2017-01-01 DIAGNOSIS — K920 Hematemesis: Secondary | ICD-10-CM | POA: Diagnosis present

## 2017-01-01 DIAGNOSIS — K279 Peptic ulcer, site unspecified, unspecified as acute or chronic, without hemorrhage or perforation: Secondary | ICD-10-CM

## 2017-01-01 DIAGNOSIS — K2990 Gastroduodenitis, unspecified, without bleeding: Secondary | ICD-10-CM | POA: Diagnosis present

## 2017-01-01 DIAGNOSIS — F329 Major depressive disorder, single episode, unspecified: Secondary | ICD-10-CM | POA: Diagnosis present

## 2017-01-01 DIAGNOSIS — Z91048 Other nonmedicinal substance allergy status: Secondary | ICD-10-CM

## 2017-01-01 DIAGNOSIS — Z992 Dependence on renal dialysis: Secondary | ICD-10-CM

## 2017-01-01 DIAGNOSIS — Z885 Allergy status to narcotic agent status: Secondary | ICD-10-CM

## 2017-01-01 DIAGNOSIS — D62 Acute posthemorrhagic anemia: Secondary | ICD-10-CM

## 2017-01-01 DIAGNOSIS — M1711 Unilateral primary osteoarthritis, right knee: Secondary | ICD-10-CM | POA: Diagnosis present

## 2017-01-01 DIAGNOSIS — Z21 Asymptomatic human immunodeficiency virus [HIV] infection status: Secondary | ICD-10-CM | POA: Diagnosis present

## 2017-01-01 DIAGNOSIS — D696 Thrombocytopenia, unspecified: Secondary | ICD-10-CM | POA: Diagnosis present

## 2017-01-01 DIAGNOSIS — I350 Nonrheumatic aortic (valve) stenosis: Secondary | ICD-10-CM

## 2017-01-01 DIAGNOSIS — R011 Cardiac murmur, unspecified: Secondary | ICD-10-CM

## 2017-01-01 DIAGNOSIS — K449 Diaphragmatic hernia without obstruction or gangrene: Secondary | ICD-10-CM | POA: Diagnosis present

## 2017-01-01 DIAGNOSIS — B009 Herpesviral infection, unspecified: Secondary | ICD-10-CM | POA: Diagnosis present

## 2017-01-01 DIAGNOSIS — K2980 Duodenitis without bleeding: Secondary | ICD-10-CM | POA: Diagnosis present

## 2017-01-01 DIAGNOSIS — F17210 Nicotine dependence, cigarettes, uncomplicated: Secondary | ICD-10-CM | POA: Diagnosis present

## 2017-01-01 DIAGNOSIS — Z91041 Radiographic dye allergy status: Secondary | ICD-10-CM

## 2017-01-01 LAB — BASIC METABOLIC PANEL
ANION GAP: 10 (ref 5–15)
BUN: 53 mg/dL — AB (ref 6–20)
CO2: 27 mmol/L (ref 22–32)
CREATININE: 6.24 mg/dL — AB (ref 0.61–1.24)
Calcium: 8.8 mg/dL — ABNORMAL LOW (ref 8.9–10.3)
Chloride: 103 mmol/L (ref 101–111)
GFR calc Af Amer: 11 mL/min — ABNORMAL LOW (ref >60)
GFR calc non Af Amer: 10 mL/min — ABNORMAL LOW (ref >60)
GLUCOSE: 110 mg/dL — AB (ref 65–99)
Potassium: 3.9 mmol/L (ref 3.5–5.1)
Sodium: 140 mmol/L (ref 135–145)

## 2017-01-01 LAB — IRON AND TIBC
Iron: 197 ug/dL — ABNORMAL HIGH (ref 45–182)
Saturation Ratios: 83 % — ABNORMAL HIGH (ref 17.9–39.5)
TIBC: 237 ug/dL — ABNORMAL LOW (ref 250–450)
UIBC: 40 ug/dL

## 2017-01-01 LAB — FERRITIN: Ferritin: 209 ng/mL (ref 24–336)

## 2017-01-01 LAB — TROPONIN I: Troponin I: <0.03 ng/mL (ref <0.03)

## 2017-01-01 LAB — CBC
HEMATOCRIT: 16.8 % — AB (ref 39.0–52.0)
Hemoglobin: 5.3 g/dL — CL (ref 13.0–17.0)
MCH: 31.9 pg (ref 26.0–34.0)
MCHC: 31.5 g/dL (ref 30.0–36.0)
MCV: 101.2 fL — AB (ref 78.0–100.0)
PLATELETS: 124 10*3/uL — AB (ref 150–400)
RBC: 1.66 MIL/uL — ABNORMAL LOW (ref 4.22–5.81)
RDW: 16.4 % — AB (ref 11.5–15.5)
WBC: 6.5 10*3/uL (ref 4.0–10.5)

## 2017-01-01 LAB — FOLATE: FOLATE: 3.5 ng/mL — AB (ref >5.9)

## 2017-01-01 LAB — RETICULOCYTES
RBC.: 1.73 MIL/uL — AB (ref 4.22–5.81)
RETIC COUNT ABSOLUTE: 136.7 10*3/uL (ref 19.0–186.0)
Retic Ct Pct: 7.9 % — ABNORMAL HIGH (ref 0.4–3.1)

## 2017-01-01 LAB — POC OCCULT BLOOD, ED: Fecal Occult Bld: NEGATIVE

## 2017-01-01 LAB — CBG MONITORING, ED: Glucose-Capillary: 86 mg/dL (ref 65–99)

## 2017-01-01 LAB — VITAMIN B12: VITAMIN B 12: 332 pg/mL (ref 180–914)

## 2017-01-01 LAB — PREPARE RBC (CROSSMATCH)

## 2017-01-01 MED ORDER — PANTOPRAZOLE SODIUM 40 MG PO TBEC
40.0000 mg | DELAYED_RELEASE_TABLET | Freq: Two times a day (BID) | ORAL | Status: DC
  Administered 2017-01-01: 40 mg via ORAL
  Filled 2017-01-01: qty 1

## 2017-01-01 MED ORDER — ACETAMINOPHEN 325 MG PO TABS
650.0000 mg | ORAL_TABLET | Freq: Four times a day (QID) | ORAL | Status: DC | PRN
Start: 2017-01-01 — End: 2017-01-02
  Administered 2017-01-02 (×2): 650 mg via ORAL
  Filled 2017-01-01 (×2): qty 2

## 2017-01-01 MED ORDER — DARUNAVIR ETHANOLATE 800 MG PO TABS
800.0000 mg | ORAL_TABLET | Freq: Every day | ORAL | Status: DC
  Administered 2017-01-02 – 2017-01-07 (×5): 800 mg via ORAL
  Filled 2017-01-01 (×8): qty 1

## 2017-01-01 MED ORDER — RITONAVIR 100 MG PO TABS
100.0000 mg | ORAL_TABLET | Freq: Every day | ORAL | Status: DC
  Administered 2017-01-02 – 2017-01-07 (×6): 100 mg via ORAL
  Filled 2017-01-01 (×6): qty 1

## 2017-01-01 MED ORDER — FOLIC ACID 1 MG PO TABS
1.0000 mg | ORAL_TABLET | Freq: Every day | ORAL | Status: DC
  Administered 2017-01-01 – 2017-01-08 (×7): 1 mg via ORAL
  Filled 2017-01-01 (×7): qty 1

## 2017-01-01 MED ORDER — SODIUM CHLORIDE 0.9% FLUSH
3.0000 mL | Freq: Two times a day (BID) | INTRAVENOUS | Status: DC
  Administered 2017-01-02 – 2017-01-07 (×5): 3 mL via INTRAVENOUS

## 2017-01-01 MED ORDER — SODIUM CHLORIDE 0.9 % IV SOLN
Freq: Once | INTRAVENOUS | Status: AC
  Administered 2017-01-01: 10 mL/h via INTRAVENOUS

## 2017-01-01 MED ORDER — ACETAMINOPHEN 650 MG RE SUPP
650.0000 mg | Freq: Four times a day (QID) | RECTAL | Status: DC | PRN
Start: 2017-01-01 — End: 2017-01-02

## 2017-01-01 MED ORDER — ALBUTEROL SULFATE (2.5 MG/3ML) 0.083% IN NEBU: 2.5000 mg | INHALATION_SOLUTION | Freq: Four times a day (QID) | RESPIRATORY_TRACT | Status: DC | PRN

## 2017-01-01 MED ORDER — TENOFOVIR DISOPROXIL FUMARATE 300 MG PO TABS
300.0000 mg | ORAL_TABLET | ORAL | Status: DC
  Administered 2017-01-04: 300 mg via ORAL
  Filled 2017-01-01: qty 1

## 2017-01-01 MED ORDER — ONDANSETRON HCL 4 MG PO TABS: 4.0000 mg | ORAL_TABLET | Freq: Four times a day (QID) | ORAL | Status: DC | PRN

## 2017-01-01 MED ORDER — SODIUM CHLORIDE 0.9 % IV SOLN: 250.0000 mL | INTRAVENOUS | Status: DC | PRN

## 2017-01-01 MED ORDER — DOLUTEGRAVIR SODIUM 50 MG PO TABS
50.0000 mg | ORAL_TABLET | Freq: Every day | ORAL | Status: DC
  Administered 2017-01-01 – 2017-01-07 (×7): 50 mg via ORAL
  Filled 2017-01-01 (×8): qty 1

## 2017-01-01 MED ORDER — SODIUM CHLORIDE 0.9% FLUSH
3.0000 mL | INTRAVENOUS | Status: DC | PRN
  Administered 2017-01-02: 3 mL via INTRAVENOUS
  Filled 2017-01-01: qty 3

## 2017-01-01 MED ORDER — ONDANSETRON HCL 4 MG/2ML IJ SOLN
4.0000 mg | Freq: Four times a day (QID) | INTRAMUSCULAR | Status: DC | PRN
  Administered 2017-01-05: 4 mg via INTRAVENOUS
  Filled 2017-01-01: qty 2

## 2017-01-01 MED ORDER — POLYETHYLENE GLYCOL 3350 17 G PO PACK: 17.0000 g | PACK | Freq: Every day | ORAL | Status: DC | PRN

## 2017-01-01 MED ORDER — CALCIUM ACETATE (PHOS BINDER) 667 MG PO CAPS
2668.0000 mg | ORAL_CAPSULE | Freq: Two times a day (BID) | ORAL | Status: DC
  Administered 2017-01-02 (×2): 2668 mg via ORAL
  Filled 2017-01-01 (×3): qty 4

## 2017-01-01 MED ORDER — CALCIUM ACETATE (PHOS BINDER) 667 MG PO CAPS: 2668.0000 mg | ORAL_CAPSULE | ORAL | Status: DC | PRN

## 2017-01-01 MED ORDER — SODIUM CHLORIDE 0.9% FLUSH
3.0000 mL | Freq: Two times a day (BID) | INTRAVENOUS | Status: DC
  Administered 2017-01-02 – 2017-01-06 (×6): 3 mL via INTRAVENOUS

## 2017-01-01 NOTE — ED Provider Notes (Signed)
[facility] DEPT Provider Note   CSN: [id number] Arrival date & time: 01/01/17  1518     History   Chief Complaint Chief Complaint  Patient presents with  . Near Syncope    HPI [name] is a 51 y.o. male.  HPI  Patient was at dialysis, when he stood up from getting a full session he had light headedness, BP checked and was 82/30's. Brought here for eval. No cp, back pain, abdominal pain, melena, hematochezia or other associated symptoms. BP is always low after dialysis.   Past Medical History:  Diagnosis Date  . Anal condyloma   . Anemia   . Diarrhea    occurs because of dialysis  . DJD (degenerative joint disease)    right knee.  All joints  . ESRD (end stage renal disease) on dialysis ([place]) 01/02/2012   Gets diaysis at [facility] on [address], TTS schedule.  Started dialysis in 2000.     . Family history of disseminated HSV infection   . Genital herpes   . Hemodialysis patient [facility])    Tuesday, Thursday, Saturday  . History of blood transfusion   . HIV infection ([place])   . Hypertension   . Pneumonia 1/20107   hx of  . Shortness of breath    "can happen at anytime" (03/04/2013).  03/14/16- "when I have too much fluid"  . Tertiary syphilis   . 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 Reclotted on 03/02/13 and IR placed L IJ tunneled HD catheter (a right external jugular tunneled HD cath was attempted first but did not function due to kinking across the clavicle). Pt was seen by [facility] on same admission 6/614 and said RUA AVF no longer usable, they ordered vein mapping and will see him as outpatient to set up next permanent access.       Patient Active Problem List   Diagnosis Date Noted  . Toe ulcer ([place])   . Epigastric abdominal pain   . Melena   . Gastrointestinal hemorrhage with hematemesis 12/07/2016  . Gastroesophageal reflux disease 09/13/2016  . Routine adult health maintenance  09/13/2016  . Normocytic anemia 10/14/2015  . Thrombocytopenia ([place]) 10/14/2015  . Gall stone   . Right upper quadrant pain   . Right lower lobe pneumonia ([place]) 10/13/2015  . 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 ESRD (end stage renal disease) on dialysis 01/02/2012  . PERIPHERAL NEUROPATHY 04/10/2009  . BACTEREMIA, MYCOBACTERIUM AVIUM COMPLEX 10/19/2006  . HIV disease ([place]) 10/19/2006  . HERPES, GENITAL NEC 10/19/2006  . WARTS, OTHER SPECIFIED VIRAL 10/19/2006  . SYPHILIS NOS 10/19/2006  . CIGARETTE SMOKER 10/19/2006  . Depression 10/19/2006    Past Surgical History:  Procedure Laterality Date  . APPENDECTOMY    . AV FISTULA PLACEMENT Right 06/16/11  . AV FISTULA PLACEMENT Left 03/17/2016   Procedure: INSERTION OF ARTERIOVENOUS (AV) GORE-TEX GRAFT ARM USING GORETEX 4-7MM X 45CM STRETCH GRAFT;  Surgeon: Elam Dutch, MD;  Location: [facility];  Service: Vascular;  Laterality: Left;  . AV FISTULA PLACEMENT, BRACHIOCEPHALIC Right 64/68/03  . ESOPHAGOGASTRODUODENOSCOPY N/A 12/09/2016   Procedure: ESOPHAGOGASTRODUODENOSCOPY (EGD);  Surgeon: Otis Brace, MD;  Location: [facility];  Service: Gastroenterology;  Laterality: N/A;  . INCISION AND DRAINAGE ABSCESS     abdominal  . PERIPHERAL VASCULAR CATHETERIZATION N/A 03/07/2016   Procedure: Venogram;  Surgeon: Elam Dutch, MD;  Location: [facility] CV LAB;  Service:  Cardiovascular;  Laterality: N/A;  . REVISON OF ARTERIOVENOUS FISTULA Right 04/20/2013   Procedure: INSERTION OF ARTERIOVENOUS GORTEX GRAFT;  Surgeon: Elam Dutch, MD;  Location: [facility];  Service: Vascular;  Laterality: Right;  Ultrasound Guided  . TONSILLECTOMY         Home Medications    Prior to Admission medications   Medication Sig Start Date End Date Taking? Authorizing Provider  acetaminophen (TYLENOL) 500 MG tablet Take 1,000 mg by mouth every 6 (six) hours as needed for headache (pain).    Historical Provider, MD  albuterol (PROVENTIL  HFA;VENTOLIN HFA) 108 (90 Base) MCG/ACT inhaler Inhale 2 puffs into the lungs every 6 (six) hours as needed for wheezing or shortness of breath.    Historical Provider, MD  calcium acetate (PHOSLO) 667 MG capsule Take 2,668 mg by mouth 2 (two) times daily with a meal.     Historical Provider, MD  darunavir (PREZISTA) 800 MG tablet TAKE 1 TABLET BY MOUTH AT BEDTIME WITH NORVIR 12/29/16   Michel Bickers, MD  docusate sodium (COLACE) 100 MG capsule Take 1 capsule (100 mg total) by mouth daily. 12/10/16   Eloise Levels, MD  dolutegravir (TIVICAY) 50 MG tablet Take 1 tablet (50 mg total) by mouth at bedtime. 12/29/16   Michel Bickers, MD  nystatin (MYCOSTATIN/NYSTOP) powder Apply topically 3 (three) times daily. For 14 days 12/10/16   Eloise Levels, MD  pantoprazole (PROTONIX) 40 MG tablet TAKE 1 TABLET BY MOUTH TWICE DAILY 12/11/16   Mercy Riding, MD  ritonavir (NORVIR) 100 MG TABS tablet TAKE 1 TABLET BY MOUTH AT BEDTIME. TAKE WITH PREZISTA TABLET 12/29/16   Michel Bickers, MD  tenofovir (VIREAD) 300 MG tablet TAKE 1 TABLET BY MOUTH ONCE A WEEK ON SUNDAYS AT BEDTIME 12/29/16   Michel Bickers, MD  valACYclovir (VALTREX) 500 MG tablet Take 1 tablet (500 mg total) by mouth daily as needed (herpes outbreaks). 09/01/16   Michel Bickers, MD    Family History Family History  Problem Relation Age of Onset  . Diabetes Mother   . Arthritis Father     Gout- Foot    Social History Social History  Substance Use Topics  . Smoking status: Heavy Tobacco Smoker    Packs/day: 0.50    Years: 27.00    Types: Cigarettes  . Smokeless tobacco: Never Used     Comment: cutting back from last visit  . Alcohol use No     Comment: 03/04/2013 "stopped drinking ~ 12 yr ago; never had problem w/it".       Allergies   Penicillins; Contrast media [iodinated diagnostic agents]; Acetaminophen-codeine; and Tape   Review of Systems Review of Systems  All other systems reviewed and are negative.    Physical Exam Updated Vital  Signs BP (!) 108/48   Pulse 80   Temp 97.4 F (36.3 C) (Oral)   Resp 17   Ht 6' (1.829 m)   Wt 159 lb (72.1 kg)   SpO2 100%   BMI 21.56 kg/m   Physical Exam  Constitutional: He is oriented to person, place, and time. He appears well-developed and well-nourished.  HENT:  Head: Normocephalic and atraumatic.  Eyes: Conjunctivae and EOM are normal.  Neck: Normal range of motion.  Cardiovascular: Normal rate.   Pulmonary/Chest: Effort normal. No respiratory distress.  Abdominal: Soft. He exhibits no distension.  Musculoskeletal: Normal range of motion.  Neurological: He is alert and oriented to person, place, and time. No cranial nerve deficit. Coordination normal.  Skin: Skin is warm and dry.  Nursing note and vitals reviewed.    ED Treatments / Results  Labs (all labs ordered are listed, but only abnormal results are displayed) Labs Reviewed  BASIC METABOLIC PANEL - Abnormal; Notable for the following:       Result Value   Glucose, Bld 110 (*)    BUN 53 (*)    Creatinine, Ser 6.24 (*)    Calcium 8.8 (*)    GFR calc non Af Amer 10 (*)    GFR calc Af Amer 11 (*)    All other components within normal limits  CBC - Abnormal; Notable for the following:    RBC 1.66 (*)    Hemoglobin 5.3 (*)    HCT 16.8 (*)    MCV 101.2 (*)    RDW 16.4 (*)    Platelets 124 (*)    All other components within normal limits  CBG MONITORING, ED  POC OCCULT BLOOD, ED  PREPARE RBC (CROSSMATCH)    EKG  EKG Interpretation None       Radiology No results found.  Procedures Procedures (including critical care time)  CRITICAL CARE Performed by: Merrily Pew Total critical care time: 35 minutes Critical care time was exclusive of separately billable procedures and treating other patients. Critical care was necessary to treat or prevent imminent or life-threatening deterioration. Critical care was time spent personally by me on the following activities: development of treatment plan  with patient and/or surrogate as well as nursing, discussions with consultants, evaluation of patient's response to treatment, examination of patient, obtaining history from patient or surrogate, ordering and performing treatments and interventions, ordering and review of laboratory studies, ordering and review of radiographic studies, pulse oximetry and re-evaluation of patient's condition.   Medications Ordered in ED Medications  darunavir (PREZISTA) tablet 800 mg (800 mg Oral Given 01/02/17 1031)  dolutegravir (TIVICAY) tablet 50 mg (50 mg Oral Given 01/01/17 2348)  ritonavir (NORVIR) tablet 100 mg (100 mg Oral Given 01/02/17 1031)  tenofovir (VIREAD) tablet 300 mg (not administered)  albuterol (PROVENTIL) (2.5 MG/3ML) 0.083% nebulizer solution 2.5 mg (not administered)  calcium acetate (PHOSLO) capsule 2,668 mg (2,668 mg Oral Given 01/02/17 1030)  sodium chloride flush (NS) 0.9 % injection 3 mL (3 mLs Intravenous Given 01/02/17 1032)  sodium chloride flush (NS) 0.9 % injection 3 mL (3 mLs Intravenous Given 01/02/17 1031)  sodium chloride flush (NS) 0.9 % injection 3 mL (not administered)  0.9 %  sodium chloride infusion (not administered)  polyethylene glycol (MIRALAX / GLYCOLAX) packet 17 g (not administered)  ondansetron (ZOFRAN) tablet 4 mg (not administered)    Or  ondansetron (ZOFRAN) injection 4 mg (not administered)  folic acid (FOLVITE) tablet 1 mg (1 mg Oral Given 01/02/17 1031)  calcium acetate (PHOSLO) capsule 2,668 mg (not administered)  pantoprazole (PROTONIX) injection 40 mg (40 mg Intravenous Given 01/02/17 1030)  acetaminophen (TYLENOL) tablet 650 mg (not administered)    Or  acetaminophen (TYLENOL) suppository 650 mg (not administered)  0.9 %  sodium chloride infusion (0 mL/hr Intravenous Stopped 01/01/17 2121)     Initial Impression / Assessment and Plan / ED Course  I have reviewed the triage vital signs and the nursing notes.  Pertinent labs & imaging results that were available  during my care of the patient were reviewed by me and considered in my medical decision making (see chart for details).    Anemia, significantly worsened from previous. Has low pressures as well, expect them  to improve with the blood transfusion. Hemoccult negative, anemia panel added on. Transfusion started for symptomatic anemia and low bp's.   Final Clinical Impressions(s) / ED Diagnoses   Final diagnoses:  Near syncope  Anemia, unspecified type     Merrily Pew, MD 01/02/17 1057

## 2017-01-01 NOTE — ED Notes (Signed)
Unsuccessful attempt to draw type and screen, phlebotomy called.

## 2017-01-01 NOTE — ED Notes (Signed)
cbg was 86 

## 2017-01-01 NOTE — ED Notes (Signed)
1st unit verified with Caryn Bee, RN

## 2017-01-01 NOTE — ED Notes (Signed)
Dr. Clayborne Dana aware of pt hgb.

## 2017-01-01 NOTE — ED Notes (Signed)
Provider at the bedside.  

## 2017-01-01 NOTE — ED Notes (Signed)
Phlebotomy at bedside.

## 2017-01-01 NOTE — ED Notes (Addendum)
ED Provider at bedside. EDP aware of BP

## 2017-01-01 NOTE — ED Triage Notes (Signed)
Pt from dialysis via EMS after near syncopal episode. Per EMS, pt had finished full dialysis treatment when he went to walk to the restroom and became weak with an unsteady gait. Staff helped to lower pt to the ground , pt supine on scene. 96/40, 85 bpm NSR, 97% on RA, CBG 116. Given 4 mg zofran by EMS. 22 G in R forearm. Denies SOB, CP, dizziness, or palpitations. Denies N/V.

## 2017-01-01 NOTE — Progress Notes (Signed)
New Admission Note:  Arrival Method: Stretcher from ED Mental Orientation: A&O x4 Telemetry: Box 8, CCMD notified. Assessment: Completed Skin: Assessed with Artemio Aly, RN, check flowsheets IV: R forearm, 1 Unit of RBC infusing Pain: 0/10 Safety Measures: Safety Fall Prevention Plan discussed with patient. Patient is a high fall risk, bed alarm activated and patient educated about calling for assistance when needing to go to the bathroom. Admission: Completed [facility] [address] Orientation: Patient has been orientated to the room, unit and the staff. Family: Wife at bedside.   Psychosocial not completed due to patient's wife being at the bedside.   Orders have been reviewed and implemented. Will continue to monitor the patient. Call light has been placed within reach.  Rivka Barbara BSN, RN  Phone Number: [phone]

## 2017-01-01 NOTE — H&P (Signed)
[facility] Admission History and Physical Service Pager: [phone]  Patient name: [name] Medical record number: [id number] Date of birth: 04-12-1966 Age: 51 y.o. Gender: male  Primary Care Provider: Mercy Riding, MD Consultants: None  Code Status: Full  Chief Complaint: Lightheadedness  Assessment and Plan: [name] is a 51 y.o. male presenting with lightheadedness and hypotension after receiving dialysis. 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  #Acute blood loss anemia  Macrocytic anemia: Acute. Hgb drop 8.2>5.3. Baseline12-13 as of 2017, with recent drop to 9 as of last month. GI workup with EGD shows gastroduodenitis 11/2016, no acute areas of bleeding noted on endoscopy. Last colonoscopy unknown. FOBT negative on admission. Noted to be hypotensive in the ED with systolic's in the 96P and diastolic in 59-16B, though on chart review this appears to be patient's norm, no tachycardia associated with these low blood pressures. BP stabilized without use of IV fluids or transfusion Suspect acute drop in hemoglobin is secondary to GI blood loss with underlying macrocytic anemia due to folate deficiency. No recent use blood thinner or NSAID abuse. Patient denies any acute blood loss. 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  --Admit to telemetry for observation, attending Dr. Erin Hearing --Monitor hemoglobin and BP with plan to transfuse 2 units pRBC --Will consult gastroenterology in the morning --Would benefit from colonoscopy to rule out lower GI bleed, possible consideration for repeat EGD --Folic acid tablet 1 mg QD --Repeat H&H following transfusion  --FOBT with every BM --Continue protonix 40 mg  BID  --Anemia panel, haptoglobin pending  #Presyncope  Lightheadedness  SOB: Acute. Resolved. Associated with changes in position  Suspect this is secondary to acute blood loss anemia and dialysis. Initial troponin negative. Due to patient's hx of smoking and ESRD, will rule out cardiac cause. EKG with t-wave inversion in lead II, III, and AVF, this is a slightly new finding from previous, though could simple be due to lead placement. Normal neurologic exam making TIA unlikely. --Plan per above --Trending troponin --Repeat EKG in morning --Cardiac monitoring and continuous pulse ox --Orthostatics pending  #End-stage renal disease: Secondary to ADPCKD. HD T/Th/Sat, followed by Dr Arty Baumgartner. Has mature graft on left arm. --Will consult nephrology in the morning --PhosLo BID w/ meals  #HIV: Followed by Dr Megan Salon. Last CD4 ct 990, with viral load 250 as of 11/2016. Norvir has rare side effect of anemia I do not believe this is the source of his acute anemia. --Continue ART therapy: Tivicay, Norvir, Viread, Prezista  #GERD: Denies recent symptoms of reflux. On PPI at home. --Protonix 40 mg BID --Zofran PRN  #Tobacco use disorder: Has 13-pack-year history. Currently smokes 3/4 pack daily. 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 --Holding Valtrex 500 mg QD PRN  #Depression: Denies feelings of depression on admission. --Monitor outpatient   FEN/GI: SLIV, PPI Prophylaxis: SCDs  Disposition: Pending improvement acute blood loss anemia with 2 unit transfusion pRBC.  History of Present Illness:  [name] is a 51 y.o. male presenting with lightheadedness and hypotension after receiving dialysis. PMH is significant for ESRD (HD T/Th/Sat), HIV (viral load 250 on 11/2016), recent hospitalization for melena due to gastroduodenitis (3/11-3/14), genital herpes, GERD, tobacco use disorder, and depression.  Patient was in normal state of health  this morning while driving to his job interview. He then proceeded to dialysis in the afternoon at which point he was receiving the beginning of his treatment and had the urge to use the bathroom. Patient felt lightheaded prior to sitting up and made his way towards the bathroom at which point he felt as if he was going to pass out and became short of breath. Patient then felt his legs give way and he collapsed stating he did not hit his head and did not lose vision or consciousness. Vital signs noted: BP 96/40, 85 bpm NSR, 97% on RA, CBG 116 prompting transfer to [facility] ED by EMS. Patient was nauseous and received Zofran on arrival. Patient denied history of CP, palpitations, focal weakness, abdominal pain. Of note, patient endorses history of melena which has resolved since discharge from prior admission 1 month ago. he denies use of blood thinners including ASA, NSAIDs.  On arrival to ED, VS significant for hypotension as low as 93/33 without reflex tachycardia and normal work of breathing with O2 sat in the high 90s on RA, remaining afebrile. BPs rapidly improved 110s/60s without use of IV fluids. Pertinent labs include hemoglobin 5.3, platelets 124, FOBT negative, troponin negative. Patient admitted to observation for symptomatic anemia under [facility] Service.  Review Of Systems: Per HPI with the following additions:  Otherwise the remainder of the systems were negative.  Review of Systems  Constitutional: Negative for chills and fever.  HENT: Negative for congestion and sore throat.   Eyes: Negative for blurred vision and double vision.  Respiratory: Positive for shortness of breath. Negative for cough and hemoptysis.   Cardiovascular: Negative for chest pain, palpitations and leg swelling.  Gastrointestinal: Negative for abdominal pain, blood in stool, melena, nausea and vomiting.  Musculoskeletal: Positive for falls. Negative for myalgias and neck pain.  Neurological: Positive for  weakness. Negative for sensory change, focal weakness, loss of consciousness and headaches.    Patient Active Problem List   Diagnosis Date Noted  . Toe ulcer ([place])   . Epigastric abdominal pain   . Melena   . Gastrointestinal hemorrhage with hematemesis 12/07/2016  . Gastroesophageal reflux disease 09/13/2016  . Routine adult health maintenance 09/13/2016  . Normocytic anemia 10/14/2015  . Thrombocytopenia ([place]) 10/14/2015  . Gall stone   . Right upper quadrant pain   . Right lower lobe pneumonia ([place]) 10/13/2015  . 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 ESRD (end stage renal disease) on dialysis 01/02/2012  . PERIPHERAL NEUROPATHY 04/10/2009  . BACTEREMIA, MYCOBACTERIUM AVIUM COMPLEX 10/19/2006  . HIV disease ([place]) 10/19/2006  . HERPES, GENITAL NEC 10/19/2006  . WARTS, OTHER SPECIFIED VIRAL 10/19/2006  . SYPHILIS NOS 10/19/2006  . CIGARETTE SMOKER 10/19/2006  . Depression 10/19/2006    Past Medical History: Past Medical History:  Diagnosis Date  . Anal condyloma   . Anemia   . Diarrhea    occurs because of dialysis  . DJD (degenerative joint disease)    right knee.  All joints  . ESRD (end stage renal disease) on dialysis ([place]) 01/02/2012   Gets diaysis at [facility] on [address], TTS schedule.  Started dialysis in 2000.     . Family history of disseminated HSV infection   . Genital herpes   . Hemodialysis patient [facility])    Tuesday, Thursday, Saturday  . History of blood transfusion   . HIV infection ([place])   . Hypertension   .  Pneumonia 1/20107   hx of  . Shortness of breath    "can happen at anytime" (03/04/2013).  03/14/16- "when I have too much fluid"  . Tertiary syphilis   . Thrombosis of dialysis vascular access ([place]) 01/02/2012   RUA AV fistula clotted on 02/27/13, declotted by IR. Reclotted on 03/02/13 and IR placed L IJ tunneled HD catheter (a right external jugular tunneled HD cath was attempted first but did not function due to kinking across the  clavicle). Pt was seen by VVS on same admission 6/614 and said RUA AVF no longer usable, they ordered vein mapping and will see him as outpatient to set up next permanent access.       Past Surgical History: Past Surgical History:  Procedure Laterality Date  . APPENDECTOMY    . AV FISTULA PLACEMENT Right 06/16/11  . AV FISTULA PLACEMENT Left 03/17/2016   Procedure: INSERTION OF ARTERIOVENOUS (AV) GORE-TEX GRAFT ARM USING GORETEX 4-7MM X 45CM STRETCH GRAFT;  Surgeon: Elam Dutch, MD;  Location: [place];  Service: Vascular;  Laterality: Left;  . AV FISTULA PLACEMENT, BRACHIOCEPHALIC Right 49/67/59  . ESOPHAGOGASTRODUODENOSCOPY N/A 12/09/2016   Procedure: ESOPHAGOGASTRODUODENOSCOPY (EGD);  Surgeon: Otis Brace, MD;  Location: [place];  Service: Gastroenterology;  Laterality: N/A;  . INCISION AND DRAINAGE ABSCESS     abdominal  . PERIPHERAL VASCULAR CATHETERIZATION N/A 03/07/2016   Procedure: Venogram;  Surgeon: Elam Dutch, MD;  Location: [place] CV LAB;  Service: Cardiovascular;  Laterality: N/A;  . REVISON OF ARTERIOVENOUS FISTULA Right 04/20/2013   Procedure: INSERTION OF ARTERIOVENOUS GORTEX GRAFT;  Surgeon: Elam Dutch, MD;  Location: [place];  Service: Vascular;  Laterality: Right;  Ultrasound Guided  . TONSILLECTOMY      Social History: Social History  Substance Use Topics  . Smoking status: Heavy Tobacco Smoker    Packs/day: 0.50    Years: 27.00    Types: Cigarettes  . Smokeless tobacco: Never Used     Comment: cutting back from last visit  . Alcohol use No     Comment: 03/04/2013 "stopped drinking ~ 12 yr ago; never had problem w/it".     Additional social history: Lives at home with wife. Currently unemployed, however recently received job offers 2. 13-pack-year history. Denies alcohol or IV drug use. Please also refer to relevant sections of EMR.  Family History: Family History  Problem Relation Age of Onset  . Diabetes Mother   . Arthritis Father      Gout- Foot    Allergies and Medications: Allergies  Allergen Reactions  . Penicillins Hives and Other (See Comments)    Rx required Hospitalization Has patient had a PCN reaction causing immediate rash, facial/tongue/throat swelling, SOB or lightheadedness with hypotension: Yes Has patient had a PCN reaction causing severe rash involving mucus membranes or skin necrosis: No Has patient had a PCN reaction that required hospitalization Yes Has patient had a PCN reaction occurring within the last 10 years: No   . Contrast Media [Iodinated Diagnostic Agents] Hives and Itching  . Acetaminophen-Codeine Nausea And Vomiting  . Tape Rash    Use paper tape only   No current facility-administered medications on file prior to encounter.    Current Outpatient Prescriptions on File Prior to Encounter  Medication Sig Dispense Refill  . acetaminophen (TYLENOL) 500 MG tablet Take 1,000 mg by mouth every 6 (six) hours as needed for headache (pain).    Marland Kitchen albuterol (PROVENTIL HFA;VENTOLIN HFA) 108 (90 Base) MCG/ACT inhaler Inhale  2 puffs into the lungs every 6 (six) hours as needed for wheezing or shortness of breath.    . calcium acetate (PHOSLO) 667 MG capsule Take 2,668 mg by mouth 2 (two) times daily with a meal.     . darunavir (PREZISTA) 800 MG tablet TAKE 1 TABLET BY MOUTH AT BEDTIME WITH NORVIR 30 tablet 5  . docusate sodium (COLACE) 100 MG capsule Take 1 capsule (100 mg total) by mouth daily. 10 capsule 0  . dolutegravir (TIVICAY) 50 MG tablet Take 1 tablet (50 mg total) by mouth at bedtime. 30 tablet 5  . nystatin (MYCOSTATIN/NYSTOP) powder Apply topically 3 (three) times daily. For 14 days 15 g 0  . pantoprazole (PROTONIX) 40 MG tablet TAKE 1 TABLET BY MOUTH TWICE DAILY 180 tablet 0  . ritonavir (NORVIR) 100 MG TABS tablet TAKE 1 TABLET BY MOUTH AT BEDTIME. TAKE WITH PREZISTA TABLET 30 tablet 5  . tenofovir (VIREAD) 300 MG tablet TAKE 1 TABLET BY MOUTH ONCE A WEEK ON SUNDAYS AT BEDTIME 4  tablet 5  . valACYclovir (VALTREX) 500 MG tablet Take 1 tablet (500 mg total) by mouth daily as needed (herpes outbreaks). 30 tablet 11    Objective: BP (!) 118/56   Pulse 80   Temp 97.4 F (36.3 C) (Oral)   Resp 16   Ht 6' (1.829 m)   Wt 159 lb (72.1 kg)   SpO2 100%   BMI 21.56 kg/m  Exam: General: pleasant yet tired in appearance, non-emaciated, well developed, in no acute distress with non-toxic appearance HEENT: normocephalic, atraumatic, moist mucous membranes, pale tongue, PERRLA, EOMI Neck: supple, non-tender without lymphadenopathy CV: regular rate and rhythm, holosystolic murmur without rubs, or gallops, mature grafts on the left arm with bruit present Lungs: clear to auscultation bilaterally with normal work of breathing on room air Abdomen: soft, non-tender, no masses or organomegaly palpable, normoactive bowel sounds Skin: warm, dry, no rashes or lesions, cap refill < 2 seconds Extremities: warm and well perfused, normal tone MSK: motor strength 5/5 on all 4 extremities, equal grip strength Neuro: CNII-XII intact, no slurring his speech  Labs and Imaging: CBC BMET   Recent Labs Lab 01/01/17 1548  WBC 6.5  HGB 5.3*  HCT 16.8*  PLT 124*    Recent Labs Lab 01/01/17 1548  NA 140  K 3.9  CL 103  CO2 27  BUN 53*  CREATININE 6.24*  GLUCOSE 110*  CALCIUM 8.8*      [place] Bing, DO 01/01/2017, 6:21 PM PGY-1, [place] [facility] pager: [phone], text pages welcome  UPPER LEVEL ADDENDUM  I have read the above note and made revisions highlighted in blue.  Kerrin Mo, MD, PGY-2 [facility]

## 2017-01-02 DIAGNOSIS — I120 Hypertensive chronic kidney disease with stage 5 chronic kidney disease or end stage renal disease: Secondary | ICD-10-CM | POA: Diagnosis present

## 2017-01-02 DIAGNOSIS — B20 Human immunodeficiency virus [HIV] disease: Secondary | ICD-10-CM | POA: Diagnosis not present

## 2017-01-02 DIAGNOSIS — K210 Gastro-esophageal reflux disease with esophagitis: Secondary | ICD-10-CM | POA: Diagnosis present

## 2017-01-02 DIAGNOSIS — D539 Nutritional anemia, unspecified: Secondary | ICD-10-CM | POA: Diagnosis present

## 2017-01-02 DIAGNOSIS — F329 Major depressive disorder, single episode, unspecified: Secondary | ICD-10-CM | POA: Diagnosis present

## 2017-01-02 DIAGNOSIS — Z91048 Other nonmedicinal substance allergy status: Secondary | ICD-10-CM | POA: Diagnosis not present

## 2017-01-02 DIAGNOSIS — K921 Melena: Secondary | ICD-10-CM | POA: Diagnosis present

## 2017-01-02 DIAGNOSIS — Z880 Allergy status to penicillin: Secondary | ICD-10-CM | POA: Diagnosis not present

## 2017-01-02 DIAGNOSIS — D649 Anemia, unspecified: Secondary | ICD-10-CM

## 2017-01-02 DIAGNOSIS — K922 Gastrointestinal hemorrhage, unspecified: Secondary | ICD-10-CM | POA: Diagnosis not present

## 2017-01-02 DIAGNOSIS — K920 Hematemesis: Secondary | ICD-10-CM | POA: Diagnosis not present

## 2017-01-02 DIAGNOSIS — K2980 Duodenitis without bleeding: Secondary | ICD-10-CM | POA: Diagnosis present

## 2017-01-02 DIAGNOSIS — E8889 Other specified metabolic disorders: Secondary | ICD-10-CM | POA: Diagnosis present

## 2017-01-02 DIAGNOSIS — E538 Deficiency of other specified B group vitamins: Secondary | ICD-10-CM | POA: Diagnosis present

## 2017-01-02 DIAGNOSIS — Z833 Family history of diabetes mellitus: Secondary | ICD-10-CM | POA: Diagnosis not present

## 2017-01-02 DIAGNOSIS — I350 Nonrheumatic aortic (valve) stenosis: Secondary | ICD-10-CM | POA: Diagnosis not present

## 2017-01-02 DIAGNOSIS — K279 Peptic ulcer, site unspecified, unspecified as acute or chronic, without hemorrhage or perforation: Secondary | ICD-10-CM | POA: Diagnosis not present

## 2017-01-02 DIAGNOSIS — F17210 Nicotine dependence, cigarettes, uncomplicated: Secondary | ICD-10-CM | POA: Diagnosis present

## 2017-01-02 DIAGNOSIS — B009 Herpesviral infection, unspecified: Secondary | ICD-10-CM | POA: Diagnosis present

## 2017-01-02 DIAGNOSIS — Z8261 Family history of arthritis: Secondary | ICD-10-CM | POA: Diagnosis not present

## 2017-01-02 DIAGNOSIS — Z992 Dependence on renal dialysis: Secondary | ICD-10-CM | POA: Diagnosis not present

## 2017-01-02 DIAGNOSIS — A529 Late syphilis, unspecified: Secondary | ICD-10-CM | POA: Diagnosis present

## 2017-01-02 DIAGNOSIS — N2581 Secondary hyperparathyroidism of renal origin: Secondary | ICD-10-CM | POA: Diagnosis present

## 2017-01-02 DIAGNOSIS — D62 Acute posthemorrhagic anemia: Secondary | ICD-10-CM | POA: Diagnosis present

## 2017-01-02 DIAGNOSIS — N186 End stage renal disease: Secondary | ICD-10-CM | POA: Diagnosis present

## 2017-01-02 DIAGNOSIS — Z91041 Radiographic dye allergy status: Secondary | ICD-10-CM | POA: Diagnosis not present

## 2017-01-02 DIAGNOSIS — Z885 Allergy status to narcotic agent status: Secondary | ICD-10-CM | POA: Diagnosis not present

## 2017-01-02 DIAGNOSIS — K219 Gastro-esophageal reflux disease without esophagitis: Secondary | ICD-10-CM | POA: Diagnosis not present

## 2017-01-02 DIAGNOSIS — R011 Cardiac murmur, unspecified: Secondary | ICD-10-CM | POA: Diagnosis not present

## 2017-01-02 DIAGNOSIS — R55 Syncope and collapse: Secondary | ICD-10-CM | POA: Diagnosis not present

## 2017-01-02 DIAGNOSIS — D696 Thrombocytopenia, unspecified: Secondary | ICD-10-CM | POA: Diagnosis present

## 2017-01-02 DIAGNOSIS — K449 Diaphragmatic hernia without obstruction or gangrene: Secondary | ICD-10-CM | POA: Diagnosis present

## 2017-01-02 DIAGNOSIS — Z21 Asymptomatic human immunodeficiency virus [HIV] infection status: Secondary | ICD-10-CM | POA: Diagnosis present

## 2017-01-02 LAB — MRSA PCR SCREENING: MRSA by PCR: NEGATIVE

## 2017-01-02 LAB — CBC
HCT: 22.1 % — ABNORMAL LOW (ref 39.0–52.0)
HCT: 22.1 % — ABNORMAL LOW (ref 39.0–52.0)
HCT: 18.1 % — ABNORMAL LOW (ref 39.0–52.0)
Hemoglobin: 7.3 g/dL — ABNORMAL LOW (ref 13.0–17.0)
Hemoglobin: 7.1 g/dL — ABNORMAL LOW (ref 13.0–17.0)
Hemoglobin: 5.9 g/dL — CL (ref 13.0–17.0)
MCH: 30.9 pg (ref 26.0–34.0)
MCH: 30.2 pg (ref 26.0–34.0)
MCH: 30.7 pg (ref 26.0–34.0)
MCHC: 33 g/dL (ref 30.0–36.0)
MCHC: 32.1 g/dL (ref 30.0–36.0)
MCHC: 32.6 g/dL (ref 30.0–36.0)
MCV: 93.6 fL (ref 78.0–100.0)
MCV: 94 fL (ref 78.0–100.0)
MCV: 94.3 fL (ref 78.0–100.0)
PLATELETS: 121 10*3/uL — AB (ref 150–400)
PLATELETS: 142 10*3/uL — AB (ref 150–400)
PLATELETS: 148 10*3/uL — AB (ref 150–400)
RBC: 2.36 MIL/uL — ABNORMAL LOW (ref 4.22–5.81)
RBC: 2.35 MIL/uL — ABNORMAL LOW (ref 4.22–5.81)
RBC: 1.92 MIL/uL — AB (ref 4.22–5.81)
RDW: 19.9 % — ABNORMAL HIGH (ref 11.5–15.5)
RDW: 19.4 % — AB (ref 11.5–15.5)
RDW: 20.4 % — AB (ref 11.5–15.5)
WBC: 8.4 10*3/uL (ref 4.0–10.5)
WBC: 8.7 10*3/uL (ref 4.0–10.5)
WBC: 10.2 10*3/uL (ref 4.0–10.5)

## 2017-01-02 LAB — APTT: aPTT: 36 seconds (ref 24–36)

## 2017-01-02 LAB — RENAL FUNCTION PANEL
ALBUMIN: 2.1 g/dL — AB (ref 3.5–5.0)
Anion gap: 13 (ref 5–15)
BUN: 82 mg/dL — ABNORMAL HIGH (ref 6–20)
CHLORIDE: 102 mmol/L (ref 101–111)
CO2: 28 mmol/L (ref 22–32)
CREATININE: 7.97 mg/dL — AB (ref 0.61–1.24)
Calcium: 8.7 mg/dL — ABNORMAL LOW (ref 8.9–10.3)
GFR calc Af Amer: 8 mL/min — ABNORMAL LOW (ref >60)
GFR, EST NON AFRICAN AMERICAN: 7 mL/min — AB (ref >60)
Glucose, Bld: 84 mg/dL (ref 65–99)
PHOSPHORUS: 4.5 mg/dL (ref 2.5–4.6)
Potassium: 4.7 mmol/L (ref 3.5–5.1)
SODIUM: 143 mmol/L (ref 135–145)

## 2017-01-02 LAB — HEMOGLOBIN AND HEMATOCRIT, BLOOD
HCT: 18.7 % — ABNORMAL LOW (ref 39.0–52.0)
Hemoglobin: 6 g/dL — CL (ref 13.0–17.0)

## 2017-01-02 LAB — PREPARE RBC (CROSSMATCH)

## 2017-01-02 LAB — PHOSPHORUS: PHOSPHORUS: 4.5 mg/dL (ref 2.5–4.6)

## 2017-01-02 LAB — HEPATIC FUNCTION PANEL
ALK PHOS: 164 U/L — AB (ref 38–126)
ALT: 17 U/L (ref 17–63)
AST: 22 U/L (ref 15–41)
Albumin: 2.1 g/dL — ABNORMAL LOW (ref 3.5–5.0)
BILIRUBIN TOTAL: 0.5 mg/dL (ref 0.3–1.2)
Total Protein: 4.6 g/dL — ABNORMAL LOW (ref 6.5–8.1)

## 2017-01-02 LAB — TROPONIN I: Troponin I: <0.03 ng/mL (ref <0.03)

## 2017-01-02 LAB — MAGNESIUM: Magnesium: 2 mg/dL (ref 1.7–2.4)

## 2017-01-02 LAB — LACTIC ACID, PLASMA: LACTIC ACID, VENOUS: 1.1 mmol/L (ref 0.5–1.9)

## 2017-01-02 LAB — LACTATE DEHYDROGENASE: LDH: 97 U/L — ABNORMAL LOW (ref 98–192)

## 2017-01-02 LAB — PROTIME-INR
INR: 1.25
PROTHROMBIN TIME: 15.8 s — AB (ref 11.4–15.2)

## 2017-01-02 MED ORDER — ACETAMINOPHEN 650 MG RE SUPP: 650.0000 mg | RECTAL | Status: DC | PRN

## 2017-01-02 MED ORDER — ACETAMINOPHEN 325 MG PO TABS
650.0000 mg | ORAL_TABLET | ORAL | Status: DC | PRN
  Administered 2017-01-02 – 2017-01-06 (×5): 650 mg via ORAL
  Filled 2017-01-02 (×7): qty 2

## 2017-01-02 MED ORDER — PANTOPRAZOLE SODIUM 40 MG IV SOLR
40.0000 mg | Freq: Two times a day (BID) | INTRAVENOUS | Status: DC
  Administered 2017-01-02 – 2017-01-05 (×8): 40 mg via INTRAVENOUS
  Filled 2017-01-02 (×8): qty 40

## 2017-01-02 MED ORDER — SODIUM CHLORIDE 0.9 % IV SOLN
Freq: Once | INTRAVENOUS | Status: AC
  Administered 2017-01-02: via INTRAVENOUS

## 2017-01-02 NOTE — Consult Note (Signed)
Subjective:   HPI  The patient is a 51 year old male with multiple medical problems as listed below. We were asked to see him in regards to anemia with a drop in hemoglobin. The patient states that he was in the hospital last month with anemia and black colored bowel movements. He underwent an EGD which revealed esophagitis, gastritis, and duodenitis. He was subsequently sent home and treated with medication for this and he states that his stools did clear up and become brown in color. Today this morning, he noticed dark black-colored stool again. He did drop his hemoglobin yesterday and has been transfused. He denies hematemesis or abdominal pain or heartburn. He was planning to have a repeat EGD in about 8 weeks to see if the inflammation that was previously seen had healed.  Review of Systems Denies chest pain or shortness of breath  Past Medical History:  Diagnosis Date  . Anal condyloma   . Anemia   . Diarrhea    occurs because of dialysis  . DJD (degenerative joint disease)    right knee.  All joints  . ESRD (end stage renal disease) on dialysis ([facility]) 01/02/2012   Gets diaysis at [facility] on [employer] [address], TTS schedule.  Started dialysis in 2000.     . Family history of disseminated HSV infection   . Genital herpes   . Hemodialysis patient [facility])    Tuesday, Thursday, Saturday  . History of blood transfusion   . HIV infection ([facility])   . Hypertension   . Pneumonia 1/20107   hx of  . Shortness of breath    "can happen at anytime" (03/04/2013).  03/14/16- "when I have too much fluid"  . Tertiary syphilis   . 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 Reclotted on 03/02/13 and IR placed L IJ tunneled HD catheter (a right external jugular tunneled HD cath was attempted first but did not function due to kinking across the clavicle). Pt was seen by VVS on same admission 6/614 and said RUA AVF no longer usable, they ordered vein  mapping and will see him as outpatient to set up next permanent access.      Past Surgical History:  Procedure Laterality Date  . APPENDECTOMY    . AV FISTULA PLACEMENT Right 06/16/11  . AV FISTULA PLACEMENT Left 03/17/2016   Procedure: INSERTION OF ARTERIOVENOUS (AV) GORE-TEX GRAFT ARM USING GORETEX 4-7MM X 45CM STRETCH GRAFT;  Surgeon: Sherren Kerns, MD;  Location: [facility] OR;  Service: Vascular;  Laterality: Left;  . AV FISTULA PLACEMENT, BRACHIOCEPHALIC Right 06/16/11  . ESOPHAGOGASTRODUODENOSCOPY N/A 12/09/2016   Procedure: ESOPHAGOGASTRODUODENOSCOPY (EGD);  Surgeon: Kathi Der, MD;  Location: [facility] ENDOSCOPY;  Service: Gastroenterology;  Laterality: N/A;  . INCISION AND DRAINAGE ABSCESS     abdominal  . PERIPHERAL VASCULAR CATHETERIZATION N/A 03/07/2016   Procedure: Venogram;  Surgeon: Sherren Kerns, MD;  [facility] INVASIVE CV LAB;  Service: Cardiovascular;  Laterality: N/A;  . REVISON OF ARTERIOVENOUS FISTULA Right 04/20/2013   Procedure: INSERTION OF ARTERIOVENOUS GORTEX GRAFT;  Surgeon: Sherren Kerns, MD;  Location: [facility] OR;  Service: Vascular;  Laterality: Right;  Ultrasound Guided  . TONSILLECTOMY     Social History   Social History  . Marital status: Married    Spouse name: N/A  . Number of children: N/A  . Years of education: N/A   Occupational History  . Not on file.   Social History Main  Topics  . Smoking status: Heavy Tobacco Smoker    Packs/day: 0.50    Years: 27.00    Types: Cigarettes  . Smokeless tobacco: Never Used     Comment: cutting back from last visit  . Alcohol use No     Comment: 03/04/2013 "stopped drinking ~ 12 yr ago; never had problem w/it".    . Drug use: No  . Sexual activity: Not Currently    Partners: Female     Comment: declined condoms   Other Topics Concern  . Not on file   Social History Narrative  . No narrative on file   family history includes Arthritis in his father; Diabetes in his mother.  Current Facility-Administered  Medications:  .  0.9 %  sodium chloride infusion, 250 mL, Intravenous, PRN, Wendee Beavers, DO .  acetaminophen (TYLENOL) tablet 650 mg, 650 mg, Oral, Q4H PRN **OR** acetaminophen (TYLENOL) suppository 650 mg, 650 mg, Rectal, Q4H PRN, Leland Her, DO .  albuterol (PROVENTIL) (2.5 MG/3ML) 0.083% nebulizer solution 2.5 mg, 2.5 mg, Inhalation, Q6H PRN, Wendee Beavers, DO .  calcium acetate (PHOSLO) capsule 2,668 mg, 2,668 mg, Oral, BID WC, Elmon Else McMullen, DO, 2,668 mg at 01/02/17 1030 .  calcium acetate (PHOSLO) capsule 2,668 mg, 2,668 mg, Oral, PRN, Carney Living, MD .  darunavir (PREZISTA) tablet 800 mg, 800 mg, Oral, Q breakfast, Elmon Else McMullen, DO, 800 mg at 01/02/17 1031 .  dolutegravir (TIVICAY) tablet 50 mg, 50 mg, Oral, QHS, Elmon Else McMullen, DO, 50 mg at 01/01/17 2348 .  folic acid (FOLVITE) tablet 1 mg, 1 mg, Oral, Daily, Elmon Else McMullen, DO, 1 mg at 01/02/17 1031 .  ondansetron (ZOFRAN) tablet 4 mg, 4 mg, Oral, Q6H PRN **OR** ondansetron (ZOFRAN) injection 4 mg, 4 mg, Intravenous, Q6H PRN, Elmon Else McMullen, DO .  pantoprazole (PROTONIX) injection 40 mg, 40 mg, Intravenous, Q12H, Palma Holter, MD, 40 mg at 01/02/17 1030 .  polyethylene glycol (MIRALAX / GLYCOLAX) packet 17 g, 17 g, Oral, Daily PRN, Wendee Beavers, DO .  ritonavir (NORVIR) tablet 100 mg, 100 mg, Oral, Q breakfast, Elmon Else McMullen, DO, 100 mg at 01/02/17 1031 .  sodium chloride flush (NS) 0.9 % injection 3 mL, 3 mL, Intravenous, Q12H, Elmon Else McMullen, DO, 3 mL at 01/02/17 1032 .  sodium chloride flush (NS) 0.9 % injection 3 mL, 3 mL, Intravenous, Q12H, Elmon Else McMullen, DO, 3 mL at 01/02/17 1031 .  sodium chloride flush (NS) 0.9 % injection 3 mL, 3 mL, Intravenous, PRN, Wendee Beavers, DO .  Melene Muller ON 01/04/2017] tenofovir (VIREAD) tablet 300 mg, 300 mg, Oral, Q Sun-1800, Wendee Beavers, DO Allergies  Allergen Reactions  . Penicillins Hives and Other (See Comments)    Rx required  Hospitalization Has patient had a PCN reaction causing immediate rash, facial/tongue/throat swelling, SOB or lightheadedness with hypotension: Yes Has patient had a PCN reaction causing severe rash involving mucus membranes or skin necrosis: No Has patient had a PCN reaction that required hospitalization Yes Has patient had a PCN reaction occurring within the last 10 years: No   . Contrast Media [Iodinated Diagnostic Agents] Hives and Itching  . Acetaminophen-Codeine Nausea And Vomiting  . Oxycodone-Acetaminophen     Reaction unknown  . Tape Rash    Use paper tape only (NO CLEAR PLASTIC TAPE!!)     Objective:     BP (!) 96/37 (BP Location: Right Wrist)   Pulse 86   Temp  98.2 F (36.8 C) (Oral)   Resp 14   Ht 6' (1.829 m)   Wt 70.7 kg (155 lb 13.8 oz)   SpO2 100%   BMI 21.14 kg/m   He is in no acute distress  Heart regular rhythm  Lungs clear  Abdomen: Bowel sounds present, soft, nontender  Laboratory No components found for: D1    Assessment:     Anemia  Melena  No acute distress at this time        Plan:     I'll go in and give him a diet tonight. My partner Dr. Ewing Schlein will evaluate him this weekend. He should probably have another EGD, and probably a colonoscopy. This will require a prep. I do not think it is emergent and we can get to it over the next few days. Lab Results  Component Value Date   HGB 7.1 (L) 01/02/2017   HGB 7.3 (L) 01/02/2017   HGB 5.3 (LL) 01/01/2017   HCT 22.1 (L) 01/02/2017   HCT 22.1 (L) 01/02/2017   HCT 16.8 (L) 01/01/2017   HCT 25.7 (L) 12/18/2016   HCT 33.3 (L) 12/08/2016   ALKPHOS 164 (H) 01/02/2017   ALKPHOS 261 (H) 12/07/2016   ALKPHOS 156 (H) 10/16/2015   AST 22 01/02/2017   AST 20 12/07/2016   AST 23 10/16/2015   ALT 17 01/02/2017   ALT 25 12/07/2016   ALT 20 10/16/2015

## 2017-01-02 NOTE — Discharge Summary (Signed)
Family Medicine Teaching [facility] Discharge Summary  Patient name: [name] Medical record number: [id number] Date of birth: 03/28/1966 Age: 51 y.o. Gender: male Date of Admission: 01/01/2017  Date of Discharge: 01/08/2017 Admitting Physician: Carney Living, MD  Primary Care Provider: Almon Hercules, MD Consultants: Gastroenterology, Nephrology  Indication for Hospitalization: symptomatic anemia  Discharge Diagnoses/Problem List:  Symptomatic anemia in the setting of acute GI bleed Macrocytic anemia ESRD HIV GERD Tobacco use HSV Depression  Disposition: Home  Discharge Condition: Stable, improved  Discharge Exam:  General: lying in bed comfortably, NAD Cardiovascular: regular, normal S1 and S2, holosystolic murmur without rubs, or gallops Respiratory: CTAB, normal effort on room air Abdomen: soft, nontender, nondistended, + bowel sounds Extremities: warm and well perfused, no edema   Brief Hospital Course:  [name] is a 51 y.o. male presenting with lightheadedness and hypotension after receiving dialysis. 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  Patient had recent admission last month for melena at which time the GI workup showed gastroduodenitis on EGD and was discharged at Hgb of 8.2. He presented with lightheadedness and presyncope with LOC and was found to have an acute drop in his Hgb to 5.3 so was admitted for blood transfusion and further work up. After admission patient developed melena needing subsequent transfusions to maintain goal hgb of 7 so Gastroenterology was consulted. GI initially recommended colonoscopy but patient then develop hematemesis so a EGD was performed. EGD showed acute duodenitis and clotted blood in the lesser curvature of the stomach that impeded view of the cardia  and the gastric fundus. Patient continued to have dropping hemoglobin and had a repeat EGD that showed large visible vessel with active bleeding in the fundus, treated with epinephrine injection and total 5 clips placement. Patient did well and his hgb stabilized, he tolerated food well without re-bleeding and was deemed stable for discharge home. Of note, patient received a total of 8U pRBCs throughout his hospital stay.   Patient has a holosystolic murmur that was likely worsened given his profound anemia. His last echo prior to this admission was in 09/2015 which showed the following findings (EF 45-50%,  Mitral valve: Somewhat unusual calcification of intervalvularfibrose anterior mitral leafet and subchordal apparatus no MSdespite mild diastolic tenting. Right atrium: ? calcified chiari network vs catheter in RA. Atrial septum: No defect or patent foramen ovale was identified). Therefore a echo was obtained this hospital stay which showed worsened aortic stenosis.    Issues for Follow Up:  1. Please ensure patient is taking protonix  BID for the next 6-8weeks. He should be following up with GI clinic in 3-4 weeks. He will need an outpatient colonoscopy. 2. Consider rechecking CBC to ensure patient's hgb is stable.  Significant Procedures:   EGD 01/05/17. Impressions: - Small hiatal hernia. - Clotted blood in the lesser curvature of the stomach, in the cardia and in the gastric fundus. Fluid aspiration performed. - Erythematous mucosa in the prepyloric region of the stomach. - Acute duodenitis. - Normal second portion of the duodenum and third portion of the duodenum. - The examination was otherwise normal.  EGD 01/06/17. Impressions: - No gross lesions in esophagus. - Hematin (altered blood/coffee-ground-like material) in the gastric fundus and in the gastric body. Large vessel with stigmata of recent bleeding as well as oozing of blood in the fundus. This area  was initially treated  with 6-1/2 mL of epinephrine injection and needed a total of 5 clips placement. - Duodenitis. - Normal first portion of the duodenum and second portion of the duodenum. - No specimens collected.  Echo 01/08/17. Impressions: - LV EF: 55% -   60%, normal wall motion - Aortic valve: Worsened aortic stenosis. Possibly bicuspid; moderately thickened, moderately calcified leaflets; fusion of the right-left coronary commissure. Cusp separation was moderately reduced. Valve mobility was restricted. There was mild regurgitation. Peak velocity (S): 245 cm/s. Mean gradient (S): 13 mm Hg.  - Mitral valve: Calcified annulus. Valve area by pressure  half-time: 2 cm^2.  Significant Labs and Imaging:   Recent Labs Lab 01/06/17 1930 01/07/17 0359 01/08/17 0447  WBC 8.6 7.9 6.6  HGB 9.5* 9.3* 9.2*  HCT 28.1* 27.9* 26.9*  PLT 78* 87* 87*    Recent Labs Lab 01/02/17 0359 01/02/17 0932 01/03/17 0543 01/04/17 1028 01/05/17 0640 01/06/17 0841 01/06/17 1333 01/07/17 0359 01/08/17 0814  NA 143  --  139 135 135 136 137 137 136  K 4.7  --  4.9 4.6 4.5 5.0 3.8 4.0 3.5  CL 102  --  101 100* 97* 100*  --  100* 101  CO2 28  --  21* --  27 27  GLUCOSE 84  --  81 85 83 85 79 69 88  BUN 82*  --  125* 49* 81* 109*  --  45* 31*  CREATININE 7.97*  --  10.17* 6.75* 8.75* 10.74*  --  6.63* 5.46*  CALCIUM 8.7*  --  9.1 8.4* 8.3* 7.9*  --  8.2* 7.8*  MG 2.0  --   --   --   --   --   --   --   --   PHOS 4.5  4.5  --  4.8*  --   --  5.9*  --   --  3.4  ALKPHOS  --  164*  --   --   --   --   --   --   --   AST  --  22  --   --   --   --   --   --   --   ALT  --  17  --   --   --   --   --   --   --   ALBUMIN 2.1* 2.1* 2.0*  --   --  2.0*  --   --  2.3*     Results/Tests Pending at Time of Discharge: none  Discharge Medications:  Allergies as of 01/08/2017      Reactions   Penicillins Hives, Other (See Comments)   Rx required Hospitalization Has patient had a PCN reaction causing immediate  rash, facial/tongue/throat swelling, SOB or lightheadedness with hypotension: Yes Has patient had a PCN reaction causing severe rash involving mucus membranes or skin necrosis: No Has patient had a PCN reaction that required hospitalization Yes Has patient had a PCN reaction occurring within the last 10 years: No   Contrast Media [iodinated Diagnostic Agents] Hives, Itching   Acetaminophen-codeine Nausea And Vomiting   Oxycodone-acetaminophen    Reaction unknown   Tape Rash   Use paper tape only (NO CLEAR PLASTIC TAPE!!)      Medication List    STOP taking these medications   nystatin powder Commonly known as:  MYCOSTATIN/NYSTOP     TAKE these medications   acetaminophen 500 MG tablet  Commonly known as:  TYLENOL Take 1,000 mg by mouth every 6 (six) hours as needed (pain or headaches).   albuterol 108 (90 Base) MCG/ACT inhaler Commonly known as:  PROVENTIL HFA;VENTOLIN HFA Inhale 2 puffs into the lungs every 6 (six) hours as needed for wheezing or shortness of breath.   calcium acetate 667 MG capsule Commonly known as:  PHOSLO Take 2,668 mg by mouth See admin instructions. Two to three times a day with meals and 2,668 mg (also) with each snack   darunavir 800 MG tablet Commonly known as:  PREZISTA TAKE 1 TABLET BY MOUTH AT BEDTIME WITH NORVIR   docusate sodium 100 MG capsule Commonly known as:  COLACE Take 1 capsule (100 mg total) by mouth daily.   dolutegravir 50 MG tablet Commonly known as:  TIVICAY Take 1 tablet (50 mg total) by mouth at bedtime.   pantoprazole 40 MG tablet Commonly known as:  PROTONIX Take 1 tablet (40 mg total) by mouth 2 (two) times daily. What changed:  See the new instructions.   ritonavir 100 MG Tabs tablet Commonly known as:  NORVIR TAKE 1 TABLET BY MOUTH AT BEDTIME. TAKE WITH PREZISTA TABLET   tenofovir 300 MG tablet Commonly known as:  VIREAD TAKE 1 TABLET BY MOUTH ONCE A WEEK ON SUNDAYS AT BEDTIME   valACYclovir 500 MG  tablet Commonly known as:  VALTREX Take 1 tablet (500 mg total) by mouth daily as needed (herpes outbreaks).       Discharge Instructions: Please refer to Patient Instructions section of EMR for full details.  Patient was counseled important signs and symptoms that should prompt return to medical care, changes in medications, dietary instructions, activity restrictions, and follow up appointments.   Follow-Up Appointments: Follow-up Information    De Hollingshead, DO. Go on 01/13/2017.   Specialty:  Family Medicine Why:  Please go to your hospital follow up appointment at 1:15pm Contact information: [address] [place] [address] [phone]           Leland Her, DO 01/08/2017, 3:35 PM PGY-1, [facility]

## 2017-01-02 NOTE — Plan of Care (Signed)
Problem: Physical Regulation: Goal: Ability to maintain clinical measurements within normal limits will improve Outcome: Not Progressing Patient hemoglobin 6.0. MD notified by previous shift RN. Repeat CBC 5.9. MD notified via text message. Will continue to monitor. Bess Kinds, RN

## 2017-01-02 NOTE — Care Management Obs Status (Signed)
MEDICARE OBSERVATION STATUS NOTIFICATION   Patient Details  Name: [name] MRN: [id number] Date of Birth: 02/04/1966   Medicare Observation Status Notification Given:  Yes    Crytal Pensinger, Annamarie Major, RN 01/02/2017, 1:54 PM

## 2017-01-02 NOTE — Progress Notes (Signed)
Family Medicine Teaching Service Daily Progress Note Intern Pager: [phone]  Patient name: [name] Medical record number: [id number] Date of birth: [birth year] Age: 51 y.o. Gender: male  Primary Care Provider: Almon Hercules, MD Consultants: Gastroenterology, Nephrology Code Status: Full  Pt Overview and Major Events to Date:  4/5 Admitted for symptomatic anemia  Assessment and Plan: [name] is a 51 y.o. male presenting with lightheadedness and hypotension after receiving dialysis. 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  #Acute blood loss anemia  Macrocytic anemia: Baseline Hgb 12-13. Acute Hgb drop 8.2>5.3 on presentation, now s/p 2U pRBC with improvement of Hgb to 7.3. Recent admission last month for melena, GI workup with EGD shows gastroduodenitis 11/2016, no acute areas of bleeding noted on endoscopy. Never had colonoscopy. FOBT negative on admission but now having new black stools this morning. Suspect acute drop in hemoglobin is secondary to GI blood loss with underlying macrocytic anemia due to folate deficiency.  --Will consult gastroenterology, appreciate recommendations --Would benefit from colonoscopy to rule out lower GI bleed, possible consideration for repeat EGD --Folic acid tablet 1 mg QD --FOBT with every BM  --Continue protonix 40 mg BID  --Anemia panel: Iron 197, TIBC 237, Ferritin 209, folate low at 3.5, vitB12 332 --haptoglobin and LDH pending  #Presyncope  Lightheadedness  SOB: Acute. Resolved. Associated with changes in position  Secondary to acute blood loss anemia and dialysis. Troponin negative x3. Due to patient's hx of smoking and ESRD, will rule out cardiac cause. EKG with t-wave inversion worse in lateral leads and new in inferior leads. Normal neurologic exam making TIA  unlikely. --Plan per above --Repeat EKG this morning still showing t-wave inversions, unchanged from admission --Cardiac monitoring and continuous pulse ox --Orthostatics negative  #End-stage renal disease: Secondary to ADPCKD. HD T/Th/Sat, followed by Dr [facility]. Has mature graft on left arm. --Will consult nephrology in the morning --PhosLo BID w/ meals  #HIV: Followed by Dr Orvan Falconer. Last CD4 ct 990, with viral load 250 as of 11/2016. Norvir has rare side effect of anemia I do not believe this is the source of his acute anemia. --Continue ART therapy: Tivicay, Norvir, Viread, Prezista  #GERD: Denies recent symptoms of reflux. On PPI at home. --Protonix 40 mg BID --Zofran PRN  #Tobacco use disorder: Has 13-pack-year history. Currently smokes 3/4 pack daily. 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 --Holding Valtrex 500 mg QD PRN  #Depression: Denies feelings of depression on admission. --Monitor outpatient   FEN/GI: SLIV, PPI Prophylaxis: SCDs  Disposition: Pending medical management  Subjective:  Still has some slight lightheadness when sits up but feeling much improved this morning after receiving blood transfusion. No CP or SOB. States started having black tarry stools this morning. States had some arm pain at night that is chronic for him, usually relieved by tylenol at home when he takes 1-2 tablets at bedtime and additional 1-2 tablets in the middle of the night; does not need in the daytime when he is more active.   Objective: Temp:  [97.2 F (36.2 C)-98.7 F (37.1 C)] 98.5 F (36.9 C) (04/06 0500) Pulse Rate:  [80-96] 90 (04/06 0500) Resp:  [13-25] 20 (04/06 0500) BP: (88-120)/(27-87) 113/48 (04/06 0500) SpO2:  [98 %-100 %] 100 % (04/06 0500) Weight:  [70.7 kg (155 lb 13.8 oz)-72.1  kg (159 lb)] 70.7 kg (155 lb 13.8 oz) (04/05 2300) Physical Exam: General: lying in bed comfortably, in no acute  distress with non-toxic appearance Cardiovascular: regular, normal S1 and S2, holosystolic murmur without rubs, or gallops Respiratory: CTAB, normal effort on room air Abdomen: soft, nontender, nondistended, + bowel sounds Extremities: warm and well perfused, no edema  Laboratory:  Recent Labs Lab 01/01/17 1548 01/02/17 0359  WBC 6.5 8.4  HGB 5.3* 7.3*  HCT 16.8* 22.1*  PLT 124* 121*    Recent Labs Lab 01/01/17 1548 01/02/17 0359  NA 140 143  K 3.9 4.7  CL 103 102  CO2 27 28  BUN 53* 82*  CREATININE 6.24* 7.97*  CALCIUM 8.8* 8.7*  GLUCOSE 110* 84   Troponin <0.03 x3   Imaging/Diagnostic Tests: No results found.  Leland Her, DO 01/02/2017, 6:43 AM PGY-1, [facility] FPTS Intern pager: [phone], text pages welcome

## 2017-01-02 NOTE — Progress Notes (Signed)
[name] is a 51 yo male with ESRD on hemodialysis TThS. He was admitted under obs status on 4/5 for symptomatic anemia. He does have a history of GIB with gastritis/ duodenitis on EGD in 11/2016. He presented to ED with lightheadedness and dark stools and found to have hemoglobin of 5.3. He was transfused 2 u prbcs with hemoglobin up to 7.3 today. Last outpatient hemoglobin was 8.4 on 12/25/16. Outpatient Tsat 24% after recent Fe load. GI has been consulted for further recommendations.  Frequently cuts HD treatments for various reasons including arm pain  Last HD 4/5 for 2 hours left 2 kg above EDW    Dialysis Orders [facility] TThS 3h  180F 400/800 2K/2.5 Ca  Na linear UF Profile 4 EDW 71kg -LUE AVG recent intervention at [facility] in 10/2016 - referred back  for low AF/pain  -No heparin -Hectorol 4 mcg IV q HD -Mircera 150 mcg IV q 2 weeks (last 4/3)   Plan  - HD tomorrow on schedule - Transfuse with HD if hgb <7   Full consult if inpatient status   Ogechi Susann Givens PA-C [facility] Pager [phone] 01/02/2017,2:36 PM   Pt seen, examined and agree w A/P as above.  Vinson Moselle MD [employer] pager [phone]   01/02/2017, 4:58 PM

## 2017-01-03 DIAGNOSIS — K922 Gastrointestinal hemorrhage, unspecified: Secondary | ICD-10-CM

## 2017-01-03 DIAGNOSIS — R55 Syncope and collapse: Secondary | ICD-10-CM

## 2017-01-03 DIAGNOSIS — B20 Human immunodeficiency virus [HIV] disease: Secondary | ICD-10-CM

## 2017-01-03 LAB — RENAL FUNCTION PANEL
ALBUMIN: 2 g/dL — AB (ref 3.5–5.0)
Anion gap: 17 — ABNORMAL HIGH (ref 5–15)
BUN: 125 mg/dL — AB (ref 6–20)
CO2: 21 mmol/L — ABNORMAL LOW (ref 22–32)
CREATININE: 10.17 mg/dL — AB (ref 0.61–1.24)
Calcium: 9.1 mg/dL (ref 8.9–10.3)
Chloride: 101 mmol/L (ref 101–111)
GFR calc Af Amer: 6 mL/min — ABNORMAL LOW (ref >60)
GFR, EST NON AFRICAN AMERICAN: 5 mL/min — AB (ref >60)
Glucose, Bld: 81 mg/dL (ref 65–99)
PHOSPHORUS: 4.8 mg/dL — AB (ref 2.5–4.6)
Potassium: 4.9 mmol/L (ref 3.5–5.1)
Sodium: 139 mmol/L (ref 135–145)

## 2017-01-03 LAB — CBC
HCT: 18.7 % — ABNORMAL LOW (ref 39.0–52.0)
HCT: 20.2 % — ABNORMAL LOW (ref 39.0–52.0)
HEMOGLOBIN: 6.6 g/dL — AB (ref 13.0–17.0)
Hemoglobin: 6.1 g/dL — CL (ref 13.0–17.0)
MCH: 30.2 pg (ref 26.0–34.0)
MCH: 30.4 pg (ref 26.0–34.0)
MCHC: 32.6 g/dL (ref 30.0–36.0)
MCHC: 32.7 g/dL (ref 30.0–36.0)
MCV: 92.6 fL (ref 78.0–100.0)
MCV: 93.1 fL (ref 78.0–100.0)
PLATELETS: 139 10*3/uL — AB (ref 150–400)
PLATELETS: 151 10*3/uL (ref 150–400)
RBC: 2.02 MIL/uL — AB (ref 4.22–5.81)
RBC: 2.17 MIL/uL — ABNORMAL LOW (ref 4.22–5.81)
RDW: 18.3 % — AB (ref 11.5–15.5)
RDW: 18.9 % — ABNORMAL HIGH (ref 11.5–15.5)
WBC: 9.6 10*3/uL (ref 4.0–10.5)
WBC: 8.8 10*3/uL (ref 4.0–10.5)

## 2017-01-03 LAB — HAPTOGLOBIN: HAPTOGLOBIN: 156 mg/dL (ref 34–200)

## 2017-01-03 LAB — PREPARE RBC (CROSSMATCH)

## 2017-01-03 MED ORDER — SODIUM CHLORIDE 0.9 % IV SOLN: Freq: Once | INTRAVENOUS | Status: DC

## 2017-01-03 MED ORDER — MORPHINE SULFATE (PF) 4 MG/ML IV SOLN
2.0000 mg | Freq: Once | INTRAVENOUS | Status: AC
Start: 2017-01-03 — End: 2017-01-03
  Administered 2017-01-03: 2 mg via INTRAVENOUS
  Filled 2017-01-03: qty 1

## 2017-01-03 MED ORDER — NEPRO/CARBSTEADY PO LIQD: 237.0000 mL | Freq: Three times a day (TID) | ORAL | Status: DC

## 2017-01-03 MED ORDER — MORPHINE SULFATE (PF) 4 MG/ML IV SOLN
INTRAVENOUS | Status: AC
  Filled 2017-01-03: qty 1

## 2017-01-03 MED ORDER — HEPARIN SODIUM (PORCINE) 1000 UNIT/ML DIALYSIS: 1000.0000 [IU] | INTRAMUSCULAR | Status: DC | PRN

## 2017-01-03 MED ORDER — PEG 3350-KCL-NA BICARB-NACL 420 G PO SOLR
4000.0000 mL | Freq: Once | ORAL | Status: AC
  Administered 2017-01-04: 4000 mL via ORAL
  Filled 2017-01-03: qty 4000

## 2017-01-03 MED ORDER — LIDOCAINE HCL (PF) 1 % IJ SOLN: 5.0000 mL | INTRAMUSCULAR | Status: DC | PRN

## 2017-01-03 MED ORDER — SODIUM CHLORIDE 0.9 % IV SOLN: 100.0000 mL | INTRAVENOUS | Status: DC | PRN

## 2017-01-03 MED ORDER — SEVELAMER CARBONATE 800 MG PO TABS
2400.0000 mg | ORAL_TABLET | Freq: Three times a day (TID) | ORAL | Status: DC
  Administered 2017-01-03 – 2017-01-08 (×5): 2400 mg via ORAL
  Filled 2017-01-03 (×6): qty 3

## 2017-01-03 MED ORDER — RENA-VITE PO TABS
1.0000 | ORAL_TABLET | Freq: Every day | ORAL | Status: DC
  Administered 2017-01-04 – 2017-01-07 (×5): 1 via ORAL
  Filled 2017-01-03 (×5): qty 1

## 2017-01-03 MED ORDER — LIDOCAINE-PRILOCAINE 2.5-2.5 % EX CREA: 1.0000 "application " | TOPICAL_CREAM | CUTANEOUS | Status: DC | PRN

## 2017-01-03 MED ORDER — PENTAFLUOROPROP-TETRAFLUOROETH EX AERO: 1.0000 "application " | INHALATION_SPRAY | CUTANEOUS | Status: DC | PRN

## 2017-01-03 NOTE — Procedures (Signed)
  I was present at this dialysis session, have reviewed the session itself and made  appropriate changes Vinson Moselle MD [facility] pager [phone]   01/03/2017, 8:33 PM

## 2017-01-03 NOTE — Plan of Care (Signed)
Problem: Physical Regulation: Goal: Ability to maintain clinical measurements within normal limits will improve Outcome: Progressing Blood transfusion complete. Patient tolerated well. CBC scheduled for 0500. Will continue to monitor. Bess Kinds, RN

## 2017-01-03 NOTE — Consult Note (Signed)
[facility] Renal Consultation Note    Indication for Consultation:  Management of ESRD/hemodialysis; anemia, hypertension/volume and secondary hyperparathyroidism  HPI: [name] is a 51 y.o. male with ESRD on hemodialysis, HTN, GERD, HIV. He was admitted under obs status on 4/5 for symptomatic anemia. He does have a history of GIB with gastritis/ duodenitis on EGD in 11/2016. He presented to ED with lightheadedness and dark stools and found to have hemoglobin of 5.3. He was transfused 2 u prbcs on 4.5  with hemoglobin up to 7.3 yesterday. Last outpatient hemoglobin was 8.4 on 12/25/16. Outpatient Tsat 24% after recent Fe load. GI has been consulted for further recommendations. Overnight hemoglobin dropped to 5.9 and transfused another 1 unit today. He denies any bloody or dark stools today, in fact has not had BM this morning. GI planning colonoscopy on Monday.   Dialyzes at [facility]. He frequently cuts HD treatments for various reasons including arm pain. Last HD  Was 4/5 for 2 hours left 2 kg above EDW He underwent revision of his LU AVG at [facility] on 2/21 and has been referred back to them for continued arm pain during HD and declining AF.    Past Medical History:  Diagnosis Date  . Anal condyloma   . Anemia   . Diarrhea    occurs because of dialysis  . DJD (degenerative joint disease)    right knee.  All joints  . ESRD (end stage renal disease) on dialysis (HCC) 01/02/2012   Gets diaysis at [facility] on [employer], TTS schedule.  Started dialysis in 2000.     . Family history of disseminated HSV infection   . Genital herpes   . Hemodialysis patient [facility])    Tuesday, Thursday, Saturday  . History of blood transfusion   . HIV infection (HCC)   . Hypertension   . Pneumonia 1/20107   hx of  . Shortness of breath    "can happen at anytime" (03/04/2013).  03/14/16- "when I have too much fluid"  . Tertiary syphilis   . Thrombosis of dialysis  vascular access (HCC) 01/02/2012   RUA AV fistula clotted on 02/27/13, declotted by IR. Reclotted on 03/02/13 and IR placed L IJ tunneled HD catheter (a right external jugular tunneled HD cath was attempted first but did not function due to kinking across the clavicle). Pt was seen by [facility] on same admission 6/614 and said RUA AVF no longer usable, they ordered vein mapping and will see him as outpatient to set up next permanent access.      Past Surgical History:  Procedure Laterality Date  . APPENDECTOMY    . AV FISTULA PLACEMENT Right 06/16/11  . AV FISTULA PLACEMENT Left 03/17/2016   Procedure: INSERTION OF ARTERIOVENOUS (AV) GORE-TEX GRAFT ARM USING GORETEX 4-7MM X 45CM STRETCH GRAFT;  Surgeon: Sherren Kerns, MD;  Location: [facility] OR;  Service: Vascular;  Laterality: Left;  . AV FISTULA PLACEMENT, BRACHIOCEPHALIC Right 06/16/11  . ESOPHAGOGASTRODUODENOSCOPY N/A 12/09/2016   Procedure: ESOPHAGOGASTRODUODENOSCOPY (EGD);  Surgeon: Kathi Der, MD;  Location: [facility] ENDOSCOPY;  Service: Gastroenterology;  Laterality: N/A;  . INCISION AND DRAINAGE ABSCESS     abdominal  . PERIPHERAL VASCULAR CATHETERIZATION N/A 03/07/2016   Procedure: Venogram;  Surgeon: Sherren Kerns, MD;  Location: [facility] INVASIVE CV LAB;  Service: Cardiovascular;  Laterality: N/A;  . REVISON OF ARTERIOVENOUS FISTULA Right 04/20/2013   Procedure: INSERTION OF ARTERIOVENOUS GORTEX GRAFT;  Surgeon: Sherren Kerns, MD;  Location:  [facility] OR;  Service: Vascular;  Laterality: Right;  Ultrasound Guided  . TONSILLECTOMY     Family History  Problem Relation Age of Onset  . Diabetes Mother   . Arthritis Father     Gout- Foot   Social History:  reports that he has been smoking Cigarettes.  He has a 13.50 pack-year smoking history. He has never used smokeless tobacco. He reports that he does not drink alcohol or use drugs. Allergies  Allergen Reactions  . Penicillins Hives and Other (See Comments)    Rx required Hospitalization Has patient had  a PCN reaction causing immediate rash, facial/tongue/throat swelling, SOB or lightheadedness with hypotension: Yes Has patient had a PCN reaction causing severe rash involving mucus membranes or skin necrosis: No Has patient had a PCN reaction that required hospitalization Yes Has patient had a PCN reaction occurring within the last 10 years: No   . Contrast Media [Iodinated Diagnostic Agents] Hives and Itching  . Acetaminophen-Codeine Nausea And Vomiting  . Oxycodone-Acetaminophen     Reaction unknown  . Tape Rash    Use paper tape only (NO CLEAR PLASTIC TAPE!!)   Prior to Admission medications   Medication Sig Start Date End Date Taking? Authorizing Provider  acetaminophen (TYLENOL) 500 MG tablet Take 1,000 mg by mouth every 6 (six) hours as needed (pain or headaches).    Yes Historical Provider, MD  albuterol (PROVENTIL HFA;VENTOLIN HFA) 108 (90 Base) MCG/ACT inhaler Inhale 2 puffs into the lungs every 6 (six) hours as needed for wheezing or shortness of breath.   Yes Historical Provider, MD  calcium acetate (PHOSLO) 667 MG capsule Take 2,668 mg by mouth See admin instructions. Two to three times a day with meals and 2,668 mg (also) with each snack   Yes Historical Provider, MD  darunavir (PREZISTA) 800 MG tablet TAKE 1 TABLET BY MOUTH AT BEDTIME WITH NORVIR 12/29/16  Yes Cliffton [name], MD  docusate sodium (COLACE) 100 MG capsule Take 1 capsule (100 mg total) by mouth daily. 12/10/16  Yes Renne Musca, MD  dolutegravir (TIVICAY) 50 MG tablet Take 1 tablet (50 mg total) by mouth at bedtime. 12/29/16  Yes Cliffton [name], MD  nystatin (MYCOSTATIN/NYSTOP) powder Apply topically 3 (three) times daily. For 14 days Patient taking differently: Apply topically 3 (three) times daily. To affected areas of the feet 12/10/16  Yes Renne Musca, MD  pantoprazole (PROTONIX) 40 MG tablet TAKE 1 TABLET BY MOUTH TWICE DAILY 12/11/16  Yes Almon Hercules, MD  ritonavir (NORVIR) 100 MG TABS tablet TAKE 1 TABLET BY  MOUTH AT BEDTIME. TAKE WITH PREZISTA TABLET 12/29/16  Yes Cliffton [name], MD  tenofovir (VIREAD) 300 MG tablet TAKE 1 TABLET BY MOUTH ONCE A WEEK ON SUNDAYS AT BEDTIME 12/29/16  Yes Cliffton [name], MD  valACYclovir (VALTREX) 500 MG tablet Take 1 tablet (500 mg total) by mouth daily as needed (herpes outbreaks). 09/01/16  Yes Cliffton [name], MD   Current Facility-Administered Medications  Medication Dose Route Frequency Provider Last Rate Last Dose  . 0.9 %  sodium chloride infusion  250 mL Intravenous PRN Wendee Beavers, DO      . 0.9 %  sodium chloride infusion  100 mL Intravenous PRN Megan Mans Ejigiri, PA-C      . 0.9 %  sodium chloride infusion  100 mL Intravenous PRN Megan Mans Ejigiri, PA-C      . 0.9 %  sodium chloride infusion   Intravenous Once Palma Holter, MD      .  0.9 %  sodium chloride infusion   Intravenous Once Megan Mans Ejigiri, PA-C      . acetaminophen (TYLENOL) tablet 650 mg  650 mg Oral Q4H PRN Leland Her, DO   650 mg at 01/03/17 0407   Or  . acetaminophen (TYLENOL) suppository 650 mg  650 mg Rectal Q4H PRN Leland Her, DO      . albuterol (PROVENTIL) (2.5 MG/3ML) 0.083% nebulizer solution 2.5 mg  2.5 mg Inhalation Q6H PRN Wendee Beavers, DO      . calcium acetate (PHOSLO) capsule 2,668 mg  2,668 mg Oral BID WC Wendee Beavers, DO   2,668 mg at 01/02/17 1805  . calcium acetate (PHOSLO) capsule 2,668 mg  2,668 mg Oral PRN Carney Living, MD      . darunavir (PREZISTA) tablet 800 mg  800 mg Oral Q breakfast Elmon Else McMullen, DO   800 mg at 01/02/17 1031  . dolutegravir (TIVICAY) tablet 50 mg  50 mg Oral QHS Wendee Beavers, DO   50 mg at 01/02/17 2244  . folic acid (FOLVITE) tablet 1 mg  1 mg Oral Daily Wendee Beavers, DO   1 mg at 01/02/17 1031  . heparin injection 1,000 Units  1,000 Units Dialysis PRN Megan Mans Ejigiri, PA-C      . lidocaine (PF) (XYLOCAINE) 1 % injection 5 mL  5 mL Intradermal PRN Tomasa Blase, PA-C      .  lidocaine-prilocaine (EMLA) cream 1 application  1 application Topical PRN Tomasa Blase, PA-C      . ondansetron [facility]) tablet 4 mg  4 mg Oral Q6H PRN Wendee Beavers, DO       Or  . ondansetron [facility]) injection 4 mg  4 mg Intravenous Q6H PRN Wendee Beavers, DO      . pantoprazole (PROTONIX) injection 40 mg  40 mg Intravenous Q12H Palma Holter, MD   40 mg at 01/02/17 2244  . pentafluoroprop-tetrafluoroeth (GEBAUERS) aerosol 1 application  1 application Topical PRN Megan Mans Ejigiri, PA-C      . polyethylene glycol (MIRALAX / GLYCOLAX) packet 17 g  17 g Oral Daily PRN Wendee Beavers, DO      . ritonavir (NORVIR) tablet 100 mg  100 mg Oral Q breakfast Elmon Else McMullen, DO   100 mg at 01/02/17 1031  . sodium chloride flush (NS) 0.9 % injection 3 mL  3 mL Intravenous Q12H Elmon Else McMullen, DO   3 mL at 01/02/17 1032  . sodium chloride flush (NS) 0.9 % injection 3 mL  3 mL Intravenous Q12H Elmon Else McMullen, DO   3 mL at 01/02/17 1031  . sodium chloride flush (NS) 0.9 % injection 3 mL  3 mL Intravenous PRN Wendee Beavers, DO   3 mL at 01/02/17 2253  . [START ON 01/04/2017] tenofovir (VIREAD) tablet 300 mg  300 mg Oral Q Sun-1800 Elmon Else McMullen, DO        ROS: As per HPI otherwise negative.  Physical Exam: Vitals:   01/03/17 0022 01/03/17 0244 01/03/17 0515 01/03/17 1023  BP: 117/90 (!) 94/37 (!) 96/25 (!) 124/52  Pulse: 75 71 78 79  Resp: Temp: 97.4 F (36.3 C) 97.3 F (36.3 C) 98.4 F (36.9 C) 97.4 F (36.3 C)  TempSrc: Oral Oral Oral Axillary  SpO2: 100% 100% 100% 100%  Weight:      Height:  General: WDWN NAD Head: NCAT sclera not icteric MMM Neck: Supple. No JVD No masses Lungs: CTA bilaterally without wheezes, rales, or rhonchi. Breathing is unlabored. Heart: RRR with S1 S2 Abdomen: soft NT + BS Lower extremities:without edema or ischemic changes, no open wounds  Neuro: A & O  X 3. Moves all extremities spontaneously. Psych:   Responds to questions appropriately with a normal affect. Dialysis Access: LUE AVG +bruit   Labs: Basic Metabolic Panel:  Recent Labs Lab 01/01/17 1548 01/02/17 0359 01/03/17 0543  NA 140 143 139  K 3.9 4.7 4.9  CL 103 102 101  CO2 27 28 21*  GLUCOSE 110* 84 81  BUN 53* 82* 125*  CREATININE 6.24* 7.97* 10.17*  CALCIUM 8.8* 8.7* 9.1  PHOS  --  4.5  4.5 4.8*   Liver Function Tests:  Recent Labs Lab 01/02/17 0359 01/02/17 0932 01/03/17 0543  AST  --  22  --   ALT  --  17  --   ALKPHOS  --  164*  --   BILITOT  --  0.5  --   PROT  --  4.6*  --   ALBUMIN 2.1* 2.1* 2.0*   No results for input(s): LIPASE, AMYLASE in the last 168 hours. No results for input(s): AMMONIA in the last 168 hours. CBC:  Recent Labs Lab 01/01/17 1548 01/02/17 0359 01/02/17 1103 01/02/17 1815 01/02/17 1950 01/03/17 0543  WBC 6.5 8.4 8.7  --  10.2 9.6  HGB 5.3* 7.3* 7.1* 6.0* 5.9* 6.1*  HCT 16.8* 22.1* 22.1* 18.7* 18.1* 18.7*  MCV 101.2* 93.6 94.0  --  94.3 92.6  PLT 124* 121* 142*  --  148* 139*   Cardiac Enzymes:  Recent Labs Lab 01/01/17 1800 01/02/17 0359 01/02/17 0932  TROPONINI <0.03 <0.03  <0.03 <0.03   CBG:  Recent Labs Lab 01/01/17 1643  GLUCAP 86   Iron Studies:  Recent Labs  01/01/17 1800  IRON 197*  TIBC 237*  FERRITIN 209   Studies/Results: No results found.  Dialysis Orders:  [facility] TThS 3h  180F 400/800 2K/2.5 Ca  Na linear UF Profile 4 EDW 71kg -LUE AVG recent intervention at [facility] in 10/2016 - referred back  for low AF/pain  -No heparin -Hectorol 4 mcg IV q HD -Mircera 150 mcg IV q 2 weeks (last 4/3)   Assessment/Plan: 1.  ABLA secondary to GIB - per primary with GI following planning colonoscopy Monday Hemoglobin 6.1 s/p 3 units prbcs since admission. Will transfuse another 2 units  with HD today  2.  ESRD -  TTS - For HD today  3.  Hypertension/volume  - BP ok tends to run low/ left 2kg over last tmt UF to EDW  today  4.  Anemia  - as above - for transfusion with HD today  5.  Metabolic bone disease -  Cont VDRA/ use non Ca binder with ^Corr Ca  6.  Nutrition - Renal diet/vitamins - add prot supp for low albumin  7. HIV - per primary on ART   Tomasa Blase PA-C [facility] Pager [phone] 01/03/2017, 11:48 AM   Pt seen, examined and agree w A/P as above.  On HD now, BP's low, planning 2u prbc's with HD today if pt will tolerate HD (c/o L arm pain on HD).   Vinson Moselle MD [employer] pager [phone]   01/03/2017, 8:29 PM

## 2017-01-03 NOTE — Progress Notes (Signed)
[name] 9:16 AM  Subjective: Patient seen and examined and case discussed with his wife as well and his hospital computer chart was reviewed and his previous endoscopy reviewed and his case discussed with my partner Dr. Evette Cristal and he does not have any signs of bleeding and has not had a bowel movement today and has no GI complaints and supposedly will get dialysis Saturday and Tuesday and he says he was taking his pump inhibitors at home and denies any aspirin or blood thinners or nonsteroidals at home  Objective: Vital signs stable afebrile no acute distress hemoglobin stable abdomen is soft nontender  Assessment: Melena anemia in a patient with chronic renal failure  Plan: We'll probably proceed with colonoscopy on Monday and if nondiagnostic and endoscopy at the same time to recheck and if nondiagnostic probably a capsule endoscopy to follow and the colonoscopy and endoscopy was discussed with the patient and wife  [facility] E  Pager [phone] After 5PM or if no answer call [phone]

## 2017-01-03 NOTE — Progress Notes (Signed)
FPTS Interim Progress Note Late Entry:  S: Nursing notified that h/h 6. Stat CBC 5.9. Ordered 1 unit pRBC. Went to evaluate patient. Reports of no symptoms such as dizziness, lightheadedness, or shortness of breath. Has not had dark BMs since in the morning.   O: BP 101/65   Pulse 75   Temp 97.6 F (36.4 C) (Oral)   Resp 12   Ht 6' (1.829 m)   Wt 70.7 kg (155 lb 13.8 oz)   SpO2 100%   BMI 21.14 kg/m   Gen: NAD, sitting in bed comfortable.   A/P: 1 unit prbc Post transfusion h/h per nursing   Palma Holter, MD 01/03/2017, 12:16 AM PGY-2, [facility] Family Medicine Service pager [phone]

## 2017-01-03 NOTE — Progress Notes (Signed)
Family Medicine Teaching Service Daily Progress Note Intern Pager: [phone]  Patient name: [name] Medical record number: [id number] Date of birth: [birth year] Age: 51 y.o. Gender: male  Primary Care Provider: Almon Hercules, MD Consultants: Gastroenterology, Nephrology Code Status: Full  Pt Overview and Major Events to Date:  4/5 Admitted for symptomatic anemia  Assessment and Plan: [name] is a 51 y.o. male presenting with lightheadedness and hypotension after receiving dialysis. 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  #Acute blood loss anemia  Macrocytic anemia  GI bleed: Baseline Hgb 12-13. Acute Hgb drop 8.2>5.3 on presentation, now s/p 2U pRBC with improvement of Hgb to 7.3. Recent admission last month for melena, GI workup with EGD shows gastroduodenitis 11/2016, no acute areas of bleeding noted on endoscopy. Never had colonoscopy. FOBT negative on admission but new melena starting 4/5. Suspect acute drop in hemoglobin is secondary to GI blood loss with underlying macrocytic anemia due to folate deficiency.  Hgb 7.3 -> 5.9, given 1u pRBC -> 6.1. Another 1u pRBCs in process currently --GI following, appreciate recommendations - will need EGD and colonoscopy - scheduled Mon --Folic acid tablet 1 mg QD --FOBT with every BM  --Continue protonix 40 mg BID  --Anemia panel: Iron 197, TIBC 237, Ferritin 209, folate low at 3.5, vitB12 332 --haptoglobin wnl and LDH low (97, lower limit normal 98) - no hemolysis - f/u post-transfusion H&H  #Presyncope  Lightheadedness  SOB: Acute. Resolved. Associated with changes in position  Secondary to acute blood loss anemia and dialysis. Troponin negative x3. EKG with t-wave inversion worse in lateral leads and new in inferior leads.  #End-stage renal disease: Secondary to  ADPCKD. HD T/Th/Sat, followed by Dr [facility]. Has mature graft on left arm. --Nephro following, appreciate recs - HD 4/7 --PhosLo BID w/ meals  #HIV: Followed by Dr Orvan Falconer. Last CD4 ct 990, with viral load 250 as of 11/2016. Norvir has rare side effect of anemia I do not believe this is the source of his acute anemia. --Continue ART therapy: Tivicay, Norvir, Viread, Prezista  #GERD: Denies recent symptoms of reflux. On PPI at home. --Protonix 40 mg BID --Zofran PRN  #Tobacco use disorder: Has 13-pack-year history. Currently smokes 3/4 pack daily. 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 --Holding Valtrex 500 mg QD PRN  #Depression: Denies feelings of depression on admission. --Monitor outpatient    FEN/GI: SLIV, PPI Prophylaxis: SCDs  Disposition: Pending medical management  Subjective:  Feeling well. No further melena since 4/6 AM.  Hematochezia during last hospitalization, but none further.  No CP, SOB, other blood loss, light headedness.  Objective: Temp:  [97.3 F (36.3 C)-98.5 F (36.9 C)] 98.4 F (36.9 C) (04/07 0515) Pulse Rate:  [71-86] 78 (04/07 0515) Resp:  [12-14] 14 (04/07 0515) BP: (94-117)/(25-90) 96/25 (04/07 0515) SpO2:  [96 %-100 %] 100 % (04/07 0515) Physical Exam: General: lying in bed comfortably, NAD Cardiovascular: regular, normal S1 and S2, holosystolic murmur without rubs, or gallops Respiratory: CTAB, normal effort on room air Abdomen: soft, nontender, nondistended, + bowel sounds Extremities: warm and well perfused, no edema  Laboratory:  Recent Labs Lab 01/02/17 1103 01/02/17 1815 01/02/17 1950 01/03/17 0543  WBC 8.7  --  10.2 9.6  HGB 7.1* 6.0* 5.9* 6.1*  HCT 22.1* 18.7* 18.1* 18.7*  PLT 142*  --  148* 139*    Recent Labs Lab 01/01/17 1548 01/02/17 0359 01/02/17 0932 01/03/17 0543  NA 140 143  --  139  K 3.9 4.7  --  4.9  CL 103 102  --  101  CO2 27 28  --   21*  BUN 53* 82*  --  125*  CREATININE 6.24* 7.97*  --  10.17*  CALCIUM 8.8* 8.7*  --  9.1  PROT  --   --  4.6*  --   BILITOT  --   --  0.5  --   ALKPHOS  --   --  164*  --   ALT  --   --  17  --   AST  --   --  22  --   GLUCOSE 110* 84  --  81   Troponin <0.03 x3   Imaging/Diagnostic Tests: No results found.  Erasmo Downer, MD, MPH 01/03/2017, 8:59 AM PGY-3, [place] [facility] FPTS Intern pager: [phone], text pages welcome

## 2017-01-04 DIAGNOSIS — D62 Acute posthemorrhagic anemia: Secondary | ICD-10-CM

## 2017-01-04 LAB — CBC
HCT: 24.3 % — ABNORMAL LOW (ref 39.0–52.0)
HEMATOCRIT: 21.1 % — AB (ref 39.0–52.0)
HEMOGLOBIN: 8.1 g/dL — AB (ref 13.0–17.0)
Hemoglobin: 7 g/dL — ABNORMAL LOW (ref 13.0–17.0)
MCH: 30.3 pg (ref 26.0–34.0)
MCH: 30.6 pg (ref 26.0–34.0)
MCHC: 33.3 g/dL (ref 30.0–36.0)
MCHC: 33.2 g/dL (ref 30.0–36.0)
MCV: 91 fL (ref 78.0–100.0)
MCV: 92.1 fL (ref 78.0–100.0)
PLATELETS: 90 10*3/uL — AB (ref 150–400)
Platelets: 87 10*3/uL — ABNORMAL LOW (ref 150–400)
RBC: 2.67 MIL/uL — AB (ref 4.22–5.81)
RBC: 2.29 MIL/uL — ABNORMAL LOW (ref 4.22–5.81)
RDW: 17.9 % — ABNORMAL HIGH (ref 11.5–15.5)
RDW: 18.1 % — AB (ref 11.5–15.5)
WBC: 6.2 10*3/uL (ref 4.0–10.5)
WBC: 6.2 10*3/uL (ref 4.0–10.5)

## 2017-01-04 LAB — BASIC METABOLIC PANEL
ANION GAP: 10 (ref 5–15)
BUN: 49 mg/dL — ABNORMAL HIGH (ref 6–20)
CO2: 25 mmol/L (ref 22–32)
Calcium: 8.4 mg/dL — ABNORMAL LOW (ref 8.9–10.3)
Chloride: 100 mmol/L — ABNORMAL LOW (ref 101–111)
Creatinine, Ser: 6.75 mg/dL — ABNORMAL HIGH (ref 0.61–1.24)
GFR calc Af Amer: 10 mL/min — ABNORMAL LOW (ref >60)
GFR, EST NON AFRICAN AMERICAN: 9 mL/min — AB (ref >60)
Glucose, Bld: 85 mg/dL (ref 65–99)
POTASSIUM: 4.6 mmol/L (ref 3.5–5.1)
SODIUM: 135 mmol/L (ref 135–145)

## 2017-01-04 LAB — PREPARE RBC (CROSSMATCH)

## 2017-01-04 MED ORDER — SODIUM CHLORIDE 0.9 % IV SOLN: Freq: Once | INTRAVENOUS | Status: DC

## 2017-01-04 NOTE — Progress Notes (Signed)
[name] 10:18 AM  Subjective: Patient without any signs of bleeding but not in as good spirits today as yesterday and is about to drink the prep which we discussed  Objective: Vital signs stable afebrile no acute distress exam unchanged no new labs today  Assessment: Melena  Plan: Will proceed with colonoscopy tomorrow and pending those findings consider a repeat endoscopy as well and the importance of taking off the prep and drinking plenty of clear liquids was discussed  [facility] E  Pager [phone] After 5PM or if no answer call [phone]

## 2017-01-04 NOTE — Progress Notes (Signed)
Family Medicine Teaching Service Daily Progress Note Intern Pager: [phone]  Patient name: [name] Medical record number: [id number] Date of birth: 06-23-1966 Age: 51 y.o. Gender: male  Primary Care Provider: Almon Hercules, MD Consultants: Gastroenterology, Nephrology Code Status: Full  Pt Overview and Major Events to Date:  4/5 Admitted for symptomatic anemia, transfused 2U RBC 4/6 Transfused 1U RBC 4/7 Transfused 2U RBC   Assessment and Plan: [name] is a 51 y.o. male presenting with lightheadedness and hypotension after receiving dialysis. 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  #Acute blood loss anemia  Macrocytic anemia  GI bleed: Baseline Hgb 12-13 but recent admission in March for melena with gastroduodenitis on EGD, no acute areas of bleeding noted on endoscopy. FOBT negative on admission but new melena starting 4/5. Suspect acute drop in hemoglobin is secondary to GI blood loss with underlying macrocytic anemia due to folate deficiency.  Hgb drop 8.2>5.3 on presentation > 2U pRBC > 7.3 > 5.9> 6.1 >1U pRBC > 6.6 > 2U pRBC > 8.1 today --GI following, appreciate recommendations - will need colonoscopy scheduled for Mon 4/9 with possible EGD --Folic acid tablet 1 mg QD --Continue protonix 40 mg BID  --Anemia panel: Iron 197, TIBC 237, Ferritin 209, folate low at 3.5, vitB12 332 --haptoglobin wnl and LDH low (97, lower limit normal 98) - no hemolysis --Monitor CBC  #Presyncope  Lightheadedness  SOB: Acute. Resolved. Associated with changes in position  Secondary to acute blood loss anemia and dialysis. Troponin negative x3. EKG with t-wave inversion worse in lateral leads and new in inferior leads.  #End-stage renal disease: Secondary to ADPCKD. HD T/Th/Sat, followed by Dr [facility].  Has mature graft on left arm. --Nephro following, appreciate recs  --PhosLo BID w/ meals  #HIV: Followed by Dr Orvan Falconer. Last CD4 ct 990, with viral load 250 as of 11/2016. Norvir has rare side effect of anemia I do not believe this is the source of his acute anemia. --Continue ART therapy: Tivicay, Norvir, Viread, Prezista  #GERD: Denies recent symptoms of reflux. On PPI at home. --Protonix 40 mg BID --Zofran PRN  #Tobacco use disorder: Has 13-pack-year history. Currently smokes 3/4 pack daily. 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 --Holding Valtrex 500 mg QD PRN  #Depression: Denies feelings of depression on admission. --Monitor outpatient    FEN/GI: clear liquid diet, SLIV, PPI Prophylaxis: SCDs  Disposition: Pending medical management  Subjective:  Feeling very tired this morning, did not sleep well last night. No further melena since 4/6 AM.   No CP, SOB, other blood loss, light headedness.  Objective: Temp:  [97.4 F (36.3 C)-97.9 F (36.6 C)] 97.9 F (36.6 C) (04/08 0520) Pulse Rate:  [63-79] 78 (04/08 0520) Resp:  [14-20] 20 (04/08 0520) BP: (73-149)/(12-82) 106/48 (04/08 0520) SpO2:  [95 %-100 %] 99 % (04/08 0520) Weight:  [73 kg (160 lb 15 oz)-74 kg (163 lb 2.3 oz)] 74 kg (163 lb 2.3 oz) (04/07 2255) Physical Exam: General: lying in bed comfortably, NAD Cardiovascular: regular, normal S1 and S2, holosystolic murmur without rubs, or gallops Respiratory: CTAB, normal effort on room air Abdomen: soft, nontender, nondistended, + bowel sounds Extremities: warm and well perfused, no edema  Laboratory:  Recent Labs Lab 01/02/17 1950 01/03/17 0543 01/03/17 1438  WBC 10.2 9.6 8.8  HGB  5.9* 6.1* 6.6*  HCT 18.1* 18.7* 20.2*  PLT 148* 139* 151    Recent Labs Lab 01/01/17 1548 01/02/17 0359 01/02/17 0932 01/03/17 0543  NA 140 143  --  139  K 3.9 4.7  --  4.9  CL 103 102  --  101  CO2 27 28  --  21*  BUN  53* 82*  --  125*  CREATININE 6.24* 7.97*  --  10.17*  CALCIUM 8.8* 8.7*  --  9.1  PROT  --   --  4.6*  --   BILITOT  --   --  0.5  --   ALKPHOS  --   --  164*  --   ALT  --   --  17  --   AST  --   --  22  --   GLUCOSE 110* 84  --  81   Troponin <0.03 x3   Imaging/Diagnostic Tests: No results found.  Leland Her, DO  01/04/2017, 7:19 AM PGY-1, [place] [facility] FPTS Intern pager: [phone], text pages welcome

## 2017-01-04 NOTE — Progress Notes (Signed)
[facility] Progress Note  Dialysis Orders:  [facility] TThS 3h 180F 400/800 2K/2.5 Ca Na linear UF Profile 4 EDW 71kg -LUE AVG recent intervention at [facility] in 10/2016 - referred back for decling AF and  pain  -No heparin -Hectorol 4 mcg IV q HD -Mircera 150 mcg IV q 2 weeks (last 4/3)   Assessment/Plan: 1. ABLA secondary to acute GIB - per primary with GI following planning colonoscopy Monday hgb 6.1 s/p 5 units total PRBC's since admission 2. ESRD -  TTS  3. Hypertension/volume  - BP ok tends to run low/ 2kg over EDW net UF 1L with HD yesterday  4. Anemia  - hgb pending for today - follow  5. Metabolic bone disease -  Cont VDRA/ use non Ca binder with ^Corr Ca  6. Nutrition - Renal diet/vitamins - add prot supp for low albumin  7. HIV - per primary on ART  8. Pain access arm - w/ needle sticks  Subjective: No new c/os today. Doing bowel prep for colonoscopy tomorrow. Needed morphine for arm pain on HD yesterday.   Objective Vitals:   01/03/17 2255 01/03/17 2347 01/04/17 0520 01/04/17 0924  BP: (!) 108/45 (!) 93/12 (!) 106/48 (!) 93/27  Pulse: 71 72 78 70  Resp: Temp: 97.6 F (36.4 C) 97.4 F (36.3 C) 97.9 F (36.6 C) 98.5 F (36.9 C)  TempSrc: Oral   Oral  SpO2: 95% 100% 99% 100%  Weight: 74 kg (163 lb 2.3 oz)     Height:       Physical Exam General: AAM Alert NAD Heart: RRR Lungs: CTAB  Abdomen: soft NT Extremities: no LE edema  Dialysis Access: RUE AVG    Caleb Susann Givens PA-C [facility] Pager [phone] 01/04/2017,10:49 AM  LOS: 2 days   Pt seen, examined, agree w assess/plan as above with additions as indicated.  Vinson Moselle MD [facility] pager [phone]    cell [phone] 01/04/2017, 5:32 PM     Additional Objective Labs: Basic Metabolic Panel:  Recent Labs Lab 01/01/17 1548 01/02/17 0359 01/03/17 0543  NA 140 143 139  K 3.9 4.7 4.9  CL 103 102 101   CO2 27 28 21*  GLUCOSE 110* 84 81  BUN 53* 82* 125*  CREATININE 6.24* 7.97* 10.17*  CALCIUM 8.8* 8.7* 9.1  PHOS  --  4.5  4.5 4.8*   Liver Function Tests:  Recent Labs Lab 01/02/17 0359 01/02/17 0932 01/03/17 0543  AST  --  22  --   ALT  --  17  --   ALKPHOS  --  164*  --   BILITOT  --  0.5  --   PROT  --  4.6*  --   ALBUMIN 2.1* 2.1* 2.0*   No results for input(s): LIPASE, AMYLASE in the last 168 hours. CBC:  Recent Labs Lab 01/02/17 0359 01/02/17 1103  01/02/17 1950 01/03/17 0543 01/03/17 1438  WBC 8.4 8.7  --  10.2 9.6 8.8  HGB 7.3* 7.1*  < > 5.9* 6.1* 6.6*  HCT 22.1* 22.1*  < > 18.1* 18.7* 20.2*  MCV 93.6 94.0  --  94.3 92.6 93.1  PLT 121* 142*  --  148* 139* 151  < > = values in this interval not displayed. Blood Culture    Component Value Date/Time   SDES SPUTUM 10/15/2015 0028   SDES SPUTUM 10/15/2015 0028   SPECREQUEST NONE 10/15/2015 0028  SPECREQUEST NONE 10/15/2015 0028   CULT  10/15/2015 0028    MODERATE STAPHYLOCOCCUS AUREUS Note: RIFAMPIN AND GENTAMICIN SHOULD NOT BE USED AS SINGLE DRUGS FOR TREATMENT OF STAPH INFECTIONS. This organism DOES NOT demonstrate inducible Clindamycin resistance in vitro. Performed at [facility]    REPTSTATUS 10/15/2015 FINAL 10/15/2015 0028   REPTSTATUS 10/18/2015 FINAL 10/15/2015 0028    Cardiac Enzymes:  Recent Labs Lab 01/01/17 1800 01/02/17 0359 01/02/17 0932  TROPONINI <0.03 <0.03  <0.03 <0.03   CBG:  Recent Labs Lab 01/01/17 1643  GLUCAP 86   Iron Studies:  Recent Labs  01/01/17 1800  IRON 197*  TIBC 237*  FERRITIN 209   Lab Results  Component Value Date   INR 1.25 01/02/2017   INR 1.07 12/07/2016   INR 0.98 08/04/2015   Medications:  . sodium chloride   Intravenous Once  . sodium chloride   Intravenous Once  . sodium chloride   Intravenous Once  . darunavir  800 mg Oral Q breakfast  . dolutegravir  50 mg Oral QHS  . folic acid  1 mg Oral Daily  . multivitamin  1  tablet Oral QHS  . pantoprazole (PROTONIX) IV  40 mg Intravenous Q12H  . ritonavir  100 mg Oral Q breakfast  . sevelamer carbonate  2,400 mg Oral TID WC  . sodium chloride flush  3 mL Intravenous Q12H  . sodium chloride flush  3 mL Intravenous Q12H  . tenofovir  300 mg Oral Q Sun-1800

## 2017-01-04 NOTE — Progress Notes (Signed)
FPTS Interim Progress Note  S: Patient states overall he feels great this evening. Says he vomited blood clots earlier around noon time. Has felt better since without bleeding. Denies shortness of breath, chest pain, or abdominal pain.  O: BP (!) 120/42 (BP Location: Right Wrist)   Pulse 70   Temp 98.2 F (36.8 C)   Resp 14   Ht 6' (1.829 m)   Wt 161 lb (73 kg)   SpO2 99%   BMI 21.84 kg/m   General: sitting up in bed comfortably playing on phone, NAD Cardiovascular: regular, normal S1 and S2, holosystolic murmur without rubs, or gallops Respiratory: CTAB, normal effort on room air Abdomen: soft, nontender, nondistended, + bowel sounds Extremities: warm and well perfused, no edema  A/P: Hemodynamically stable. Hemoglobin 7.0 as of this evening. Plan for EGD in the morning. Will hold transfusion at this time. Anticipate hemoglobin from a in the morning and will reassess anemia status. Patient in agreement.  Wendee Beavers, DO 01/04/2017, 11:07 PM PGY-1, [facility] Service pager [phone]

## 2017-01-04 NOTE — Progress Notes (Signed)
FPTS Interim Progress Note  S:Called by RN that patient had vomited ~120cc blood. Seen and examined patient. Patient denies lightheadness, CP, SOB. States was nauseous before vomiting but now resolved. Feels has more energy now than earlier this morning.  O: BP (!) 93/36 (BP Location: Right Arm) Comment (BP Location): Wrist  Pulse 71   Temp 98.4 F (36.9 C) (Oral)   Resp 20   Ht 6' (1.829 m)   Wt 74 kg (163 lb 2.3 oz)   SpO2 100%   BMI 22.13 kg/m   General: sitting up on side of bed comfortably, appears well   A/P: GI Bleed. Patient appears hemodynamically stable; BP of 90s/30-40s appears to be patient's norm per chart review on admission, no tachycardia associated with these low BPs. - Per RN, GI recommendations: stop bowel prep and will plan for EGD in the am.   - Monitor Hgb, 6pm CBC ordered  Leland Her, DO 01/04/2017, 2:21 PM PGY-1, [facility] pager [phone]

## 2017-01-04 NOTE — Progress Notes (Signed)
Pt has vomited over 120cc of bright red thick blood. Dr Artist Pais notified and is now at bedside. VS obtained and GI DR [facility] made aware. New orders received.

## 2017-01-05 ENCOUNTER — Encounter (HOSPITAL_COMMUNITY): Payer: Self-pay | Admitting: *Deleted

## 2017-01-05 ENCOUNTER — Inpatient Hospital Stay (HOSPITAL_COMMUNITY): Payer: Medicare Other | Admitting: Anesthesiology

## 2017-01-05 ENCOUNTER — Encounter (HOSPITAL_COMMUNITY)
Admission: EM | Disposition: A | Discharge status: Home / Self Care | Payer: Self-pay | Source: Home / Self Care | Attending: Family Medicine

## 2017-01-05 DIAGNOSIS — K279 Peptic ulcer, site unspecified, unspecified as acute or chronic, without hemorrhage or perforation: Secondary | ICD-10-CM

## 2017-01-05 DIAGNOSIS — K219 Gastro-esophageal reflux disease without esophagitis: Secondary | ICD-10-CM

## 2017-01-05 HISTORY — PX: ESOPHAGOGASTRODUODENOSCOPY: SHX5428

## 2017-01-05 LAB — TYPE AND SCREEN
ABO/RH(D): B POS
Antibody Screen: NEGATIVE
UNIT DIVISION: 0
UNIT DIVISION: 0
UNIT DIVISION: 0
Unit division: 0
Unit division: 0
Unit division: 0
Unit division: 0

## 2017-01-05 LAB — BPAM RBC
BLOOD PRODUCT EXPIRATION DATE: 201805022359
BLOOD PRODUCT EXPIRATION DATE: 201805042359
BLOOD PRODUCT EXPIRATION DATE: 201805082359
Blood Product Expiration Date: 201805022359
Blood Product Expiration Date: 201805052359
Blood Product Expiration Date: 201805082359
Blood Product Expiration Date: 201805082359
ISSUE DATE / TIME: 201804051916
ISSUE DATE / TIME: 201804052130
ISSUE DATE / TIME: 201804062359
ISSUE DATE / TIME: 201804072047
ISSUE DATE / TIME: 201804072047
UNIT TYPE AND RH: 7300
UNIT TYPE AND RH: 7300
UNIT TYPE AND RH: 7300
UNIT TYPE AND RH: 7300
UNIT TYPE AND RH: 7300
UNIT TYPE AND RH: 7300
Unit Type and Rh: 7300

## 2017-01-05 LAB — CBC
HCT: 19.9 % — ABNORMAL LOW (ref 39.0–52.0)
HCT: 23.5 % — ABNORMAL LOW (ref 39.0–52.0)
HEMOGLOBIN: 7.9 g/dL — AB (ref 13.0–17.0)
Hemoglobin: 6.6 g/dL — CL (ref 13.0–17.0)
MCH: 30.7 pg (ref 26.0–34.0)
MCH: 31 pg (ref 26.0–34.0)
MCHC: 33.2 g/dL (ref 30.0–36.0)
MCHC: 33.6 g/dL (ref 30.0–36.0)
MCV: 92.6 fL (ref 78.0–100.0)
MCV: 92.2 fL (ref 78.0–100.0)
Platelets: 84 10*3/uL — ABNORMAL LOW (ref 150–400)
Platelets: 100 10*3/uL — ABNORMAL LOW (ref 150–400)
RBC: 2.15 MIL/uL — ABNORMAL LOW (ref 4.22–5.81)
RBC: 2.55 MIL/uL — AB (ref 4.22–5.81)
RDW: 18.1 % — AB (ref 11.5–15.5)
RDW: 17.2 % — ABNORMAL HIGH (ref 11.5–15.5)
WBC: 6.3 10*3/uL (ref 4.0–10.5)
WBC: 8.1 10*3/uL (ref 4.0–10.5)

## 2017-01-05 LAB — BASIC METABOLIC PANEL
ANION GAP: 13 (ref 5–15)
BUN: 81 mg/dL — AB (ref 6–20)
CALCIUM: 8.3 mg/dL — AB (ref 8.9–10.3)
CO2: 25 mmol/L (ref 22–32)
Chloride: 97 mmol/L — ABNORMAL LOW (ref 101–111)
Creatinine, Ser: 8.75 mg/dL — ABNORMAL HIGH (ref 0.61–1.24)
GFR calc Af Amer: 7 mL/min — ABNORMAL LOW (ref >60)
GFR, EST NON AFRICAN AMERICAN: 6 mL/min — AB (ref >60)
Glucose, Bld: 83 mg/dL (ref 65–99)
Potassium: 4.5 mmol/L (ref 3.5–5.1)
Sodium: 135 mmol/L (ref 135–145)

## 2017-01-05 LAB — PREPARE RBC (CROSSMATCH)

## 2017-01-05 SURGERY — EGD (ESOPHAGOGASTRODUODENOSCOPY)
Anesthesia: Monitor Anesthesia Care

## 2017-01-05 MED ORDER — PROPOFOL 500 MG/50ML IV EMUL
INTRAVENOUS | Status: DC | PRN
  Administered 2017-01-05: 75 ug/kg/min via INTRAVENOUS

## 2017-01-05 MED ORDER — SODIUM CHLORIDE 0.9 % IV SOLN
Freq: Once | INTRAVENOUS | Status: AC
  Administered 2017-01-05: 500 mL via INTRAVENOUS

## 2017-01-05 MED ORDER — PROPOFOL 10 MG/ML IV BOLUS
INTRAVENOUS | Status: DC | PRN
  Administered 2017-01-05: 20 mg via INTRAVENOUS

## 2017-01-05 MED ORDER — PHENYLEPHRINE HCL 10 MG/ML IJ SOLN
INTRAVENOUS | Status: DC | PRN
  Administered 2017-01-05: 50 ug/min via INTRAVENOUS

## 2017-01-05 MED ORDER — SODIUM CHLORIDE 0.9 % IV SOLN
INTRAVENOUS | Status: DC | PRN
  Administered 2017-01-05: 12:00:00 via INTRAVENOUS

## 2017-01-05 NOTE — Progress Notes (Signed)
[facility] Progress Note   Dialysis Orders: [facility] TThS 3h 180F 400/800 2K/2.5 Ca Na linear UF Profile 4 EDW 71kg -LUE AVG recent intervention at [facility] in 10/2016 - referred back for low AF/pain  -No heparin -Hectorol 4 mcg IV q HD -Mircera 150 mcg IV q 2 weeks (last 4/3)   Assessment/Plan: 1. ABLA secondary to GIB - for endo today due to vomiting blood last night- colonoscopy later at some point; Acute drop began mid March after [facility] admission for arm intervention and also had blood leak with machine  - baseline hgb 12-13; ferritins <50 2. ESRD -TTS - HD tomorrow; has been reporting pain in arm with HD - will see how things go tomorrow 3. Anemia - hgb 7 last pam - 6.6 today- s/p 5 units PRBC - the last 3 on 4/7- might need another unit today with additional 2 units on HD on Tuesday - no heparin HD 4. Secondary hyperparathyroidism - Hectorol 5. HTN/volume - BP low + volume- can get volume off when hgb up 6. Nutrition - npo for test alb 2 7. HIV - current meds  Sheffield Slider, PA-C [facility] Beeper [phone] 01/05/2017,8:24 AM  LOS: 3 days  I have seen and examined this patient and agree with plan per Bard Herbert.  Note change of plans for EGD since vomited BRB while taking colon prep.  Plan HD tomorrow Takuma Cifelli T,MD 01/05/2017 10:55 AM Subjective:   started vomiting blood after trying to drink prep for colonoscopy.  Tells me endo is planned this am instead and will need colonoscopy later. Denies abdominal pain Objective Vitals:   01/04/17 2205 01/04/17 2300 01/05/17 0502 01/05/17 0600  BP: (!) 120/42  (!) 78/42 (!) 99/52  Pulse:   75   Resp:   16   Temp:   97.8 F (36.6 C)   TempSrc:   Oral   SpO2:   100%   Weight:  73 kg (161 lb)    Height:       Physical Exam General:sitting on SOB; wife with him Heart: RRR Lungs: grossly clear without rales Abdomen: soft NT Extremities: tr-1+ ankle  edema Dialysis Access: left upper AVGG + bruit   Additional Objective Labs: Basic Metabolic Panel:  Recent Labs Lab 01/02/17 0359 01/03/17 0543 01/04/17 1028 01/05/17 0640  NA 143 139 135 135  K 4.7 4.9 4.6 4.5  CL 102 101 100* 97*  CO2 28 21* 25 25  GLUCOSE 84 81 85 83  BUN 82* 125* 49* 81*  CREATININE 7.97* 10.17* 6.75* 8.75*  CALCIUM 8.7* 9.1 8.4* 8.3*  PHOS 4.5  4.5 4.8*  --   --    Liver Function Tests:  Recent Labs Lab 01/02/17 0359 01/02/17 0932 01/03/17 0543  AST  --  22  --   ALT  --  17  --   ALKPHOS  --  164*  --   BILITOT  --  0.5  --   PROT  --  4.6*  --   ALBUMIN 2.1* 2.1* 2.0*   No results for input(s): LIPASE, AMYLASE in the last 168 hours. CBC:  Recent Labs Lab 01/03/17 0543 01/03/17 1438 01/04/17 1028 01/04/17 1818 01/05/17 0640  WBC 9.6 8.8 6.2 6.2 6.3  HGB 6.1* 6.6* 8.1* 7.0* 6.6*  HCT 18.7* 20.2* 24.3* 21.1* 19.9*  MCV 92.6 93.1 91.0 92.1 92.6  PLT 139* 151 90* 87* 84*  Cardiac Enzymes:  Recent Labs Lab 01/01/17 1800 01/02/17  0359 01/02/17 0932  TROPONINI <0.03 <0.03  <0.03 <0.03   CBG:  Recent Labs Lab 01/01/17 1643  GLUCAP 86   Lab Results  Component Value Date   INR 1.25 01/02/2017   INR 1.07 12/07/2016   INR 0.98 08/04/2015   Studies/Results: No results found. Medications:  . sodium chloride   Intravenous Once  . sodium chloride   Intravenous Once  . sodium chloride   Intravenous Once  . sodium chloride   Intravenous Once  . darunavir  800 mg Oral Q breakfast  . dolutegravir  50 mg Oral QHS  . folic acid  1 mg Oral Daily  . multivitamin  1 tablet Oral QHS  . pantoprazole (PROTONIX) IV  40 mg Intravenous Q12H  . ritonavir  100 mg Oral Q breakfast  . sevelamer carbonate  2,400 mg Oral TID WC  . sodium chloride flush  3 mL Intravenous Q12H  . sodium chloride flush  3 mL Intravenous Q12H  . tenofovir  300 mg Oral Q Sun-1800

## 2017-01-05 NOTE — Op Note (Signed)
[facility] Patient Name: [name] Procedure Date : 01/05/2017 MRN: [id number] Attending MD: Vida Rigger , MD Date of Birth: 1966-04-15 CSN: [id number] Age: 51 Admit Type: Inpatient Procedure:                Upper GI endoscopy Indications:              Hematemesis, Melena Providers:                Vida Rigger, MD, Priscella Mann, RN, Beryle Beams, Technician, Carmelina Dane, CRNA Referring MD:              Medicines:                Propofol total dose 270 mg IV Complications:            No immediate complications. Estimated Blood Loss:     Estimated blood loss: none. Procedure:                Pre-Anesthesia Assessment:                           - Prior to the procedure, a History and Physical                            was performed, and patient medications and                            allergies were reviewed. The patient's tolerance of                            previous anesthesia was also reviewed. The risks                            and benefits of the procedure and the sedation                            options and risks were discussed with the patient.                            All questions were answered, and informed consent                            was obtained. Prior Anticoagulants: The patient has                            taken no previous anticoagulant or antiplatelet                            agents. ASA Grade Assessment: III - A patient with                            severe systemic disease. After reviewing the risks  and benefits, the patient was deemed in                            satisfactory condition to undergo the procedure.                           After obtaining informed consent, the endoscope was                            passed under direct vision. Throughout the                            procedure, the patient's blood pressure, pulse, and   oxygen saturations were monitored continuously. The                            EG-[id number] ([id number]) scope was introduced through the                            mouth, and advanced to the third part of duodenum.                            The upper GI endoscopy was somewhat difficult due                            to excessive bleeding. Successful completion of the                            procedure was aided by changing the patient to a                            supine position. The patient tolerated the                            procedure fairly well. Scope In: Scope Out: Findings:      A small hiatal hernia was present.      Clotted blood was found in the cardia, in the gastric fundus and on the       lesser curvature of the stomach. Fluid aspiration And lavage was done       for a good while with both the patient on his left side and on his back       with the head of his bed elevated but because of increased clots we       could not get adequate visualization.      Localized moderately erythematous mucosa without bleeding was found in       the prepyloric region of the stomach.      Localized moderate inflammation characterized by congestion (edema),       erythema and shallow ulcerations was found in the duodenal bulb ithout       stigmata of at risk bleeding.      The second portion of the duodenum and third portion of the duodenum       were normal.      The exam was otherwise without abnormality. Impression:               -  Small hiatal hernia.                           - Clotted blood in the lesser curvature of the                            stomach, in the cardia and in the gastric fundus.                            Fluid aspiration performed.                           - Erythematous mucosa in the prepyloric region of                            the stomach.                           - Acute duodenitis.                           - Normal second portion of the duodenum and  third                            portion of the duodenum.                           - The examination was otherwise normal. Moderate Sedation:      moderate sedation-none Recommendation:           - Clear liquid diet today.                           - Continue present medications.                           - Return to GI clinic PRN.                           - Telephone GI clinic if symptomatic PRN.                           - Repeat upper endoscopy at appointment to be                            scheduled consider Tuesday or Wednesday reluctant                            to get a better look at the cardia and fundus. and                            consider outpatient colonoscopy when stable Procedure Code(s):        --- Professional ---                           [phone], Esophagogastroduodenoscopy, flexible,  transoral; diagnostic, including collection of                            specimen(s) by brushing or washing, when performed                            (separate procedure) Diagnosis Code(s):        --- Professional ---                           K44.9, Diaphragmatic hernia without obstruction or                            gangrene                           K92.2, Gastrointestinal hemorrhage, unspecified                           K31.89, Other diseases of stomach and duodenum                           K29.80, Duodenitis without bleeding                           K92.0, Hematemesis                           K92.1, Melena (includes Hematochezia) CPT copyright 2016 American Medical Association. All rights reserved. The codes documented in this report are preliminary and upon coder review may  be revised to meet current compliance requirements. Vida Rigger, MD 01/05/2017 12:57:39 PM This report has been signed electronically. Number of Addenda: 0

## 2017-01-05 NOTE — Anesthesia Postprocedure Evaluation (Signed)
Anesthesia Post Note  Patient: CRISPIN VOGEL  Procedure(s) Performed: Procedure(s) (LRB): ESOPHAGOGASTRODUODENOSCOPY (EGD) (N/A)  Patient location during evaluation: PACU Anesthesia Type: MAC Level of consciousness: awake Pain management: pain level controlled Vital Signs Assessment: post-procedure vital signs reviewed and stable Cardiovascular status: stable Anesthetic complications: no       Last Vitals:  Vitals:   01/05/17 1150 01/05/17 1300  BP:  (!) 145/91  Pulse: 68 76  Resp: 20 (!) 22  Temp: 36.7 C 36.5 C    Last Pain:  Vitals:   01/05/17 1150  TempSrc: Oral  PainSc:                  Marieliz Strang

## 2017-01-05 NOTE — Transfer of Care (Signed)
Immediate Anesthesia Transfer of Care Note  Patient: [name]  Procedure(s) Performed: Procedure(s): ESOPHAGOGASTRODUODENOSCOPY (EGD) (N/A)  Patient Location: Endoscopy Unit  Anesthesia Type:MAC  Level of Consciousness: awake, alert  and oriented  Airway & Oxygen Therapy: Patient Spontanous Breathing and Patient connected to nasal cannula oxygen  Post-op Assessment: Report given to RN, Post -op Vital signs reviewed and stable and Patient moving all extremities  Post vital signs: Reviewed and stable  Last Vitals:  Vitals:   01/05/17 1130 01/05/17 1150  BP: (!) 83/47   Pulse: 69 68  Resp: 19 20  Temp: 36.7 C 36.7 C    Last Pain:  Vitals:   01/05/17 1150  TempSrc: Oral  PainSc:       Patients Stated Pain Goal: 1 (36/14/43 1540)  Complications: No apparent anesthesia complications

## 2017-01-05 NOTE — Progress Notes (Signed)
[name] 11:57 AM  Subjective: Patient with 1 episode of hematemesis from the prep after only 2 glasses which was stopped and only 1 black bowel movement and no other new complaints  Objective: Vital signs stable afebrile no acute distress exam please see preassessment evaluation hemoglobin 6 currently getting transfusion 6 is stable for this patient  Assessment: Hematemesis and melena  Plan: Okay to proceed with endoscopy today to document hopefully bleeding source with anesthesia assistance with further workup and plans pending those findings and okay to have outpatient colonoscopy when stable in the future  [facility] E  Pager [phone] After 5PM or if no answer call [phone]

## 2017-01-05 NOTE — Progress Notes (Signed)
Family Medicine Teaching Service Daily Progress Note Intern Pager: [phone]  Patient name: [name] Medical record number: [id number] Date of birth: 01-05-1966 Age: 51 y.o. Gender: male  Primary Care Provider: Almon Hercules, MD Consultants: Gastroenterology, Nephrology Code Status: Full  Pt Overview and Major Events to Date:  4/5 Admitted for symptomatic anemia, transfused 2U RBC 4/6 Transfused 1U RBC 4/7 Transfused 2U RBC   Assessment and Plan: [name] is a 51 y.o. male presenting with lightheadedness and hypotension after receiving dialysis. 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  #Acute blood loss anemia  Macrocytic anemia  GI bleed: Baseline Hgb 12-13 but underlying macrocytic anemia due to folate deficiency. Patient had episode of vomiting BRB 4/8. s/p 5U pRBC. Had vomiting BRB yesterday causing drop in hgb from 8.1 > 7.0> this am 6.6> transfusing 1U pRBC and now with another episode of vomiting BRB this am --GI following, appreciate recommendations EGD scheduled for today Mon 4/9  --Folic acid tablet 1 mg QD --Continue protonix 40 mg BID  --Monitor CBC  #End-stage renal disease: Secondary to ADPCKD. HD T/Th/Sat, followed by Dr [facility]. Has mature graft on left arm. --Nephro following, appreciate recs  --PhosLo BID w/ meals  #HIV: Followed by Dr Orvan Falconer. Last CD4 ct 990, with viral load 250 as of 11/2016.  --Continue ART therapy: Tivicay, Norvir, Viread, Prezista  #GERD:  -- Continue home protonix 40 mg BID --Zofran PRN  #Tobacco use disorder: Has 13-pack-year history. Currently smokes 3/4 pack daily. 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 --Holding Valtrex 500 mg QD PRN  #Depression: Denies feelings of depression on  admission. --Monitor outpatient    FEN/GI: NPO except sips with meds, SLIV, PPI Prophylaxis: SCDs  Disposition: Pending EGD  Subjective:  Saw patient early this morning. He stated he had no further episodes of vomiting blood since yesterday afternoon. Denied lightheadedness, dizziness, CP, palpitations, SOB, nausea.  Paged by RN later this am that patient now vomiting BRB and will proceed with planned blood transfusion.  Objective: Temp:  [97.8 F (36.6 C)-98.5 F (36.9 C)] 97.8 F (36.6 C) (04/09 0502) Pulse Rate:  [70-75] 75 (04/09 0502) Resp:  [14-20] 16 (04/09 0502) BP: (78-120)/(22-52) 99/52 (04/09 0600) SpO2:  [99 %-100 %] 100 % (04/09 0502) Weight:  [73 kg (161 lb)] 73 kg (161 lb) (04/08 2300) Physical Exam: General: lying in bed comfortably, NAD Cardiovascular: regular, normal S1 and S2, holosystolic murmur without rubs, or gallops Respiratory: CTAB, normal effort on room air Abdomen: soft, nontender, nondistended, + bowel sounds Extremities: warm and well perfused, no edema  Laboratory:  Recent Labs Lab 01/04/17 1028 01/04/17 1818 01/05/17 0640  WBC 6.2 6.2 6.3  HGB 8.1* 7.0* 6.6*  HCT 24.3* 21.1* 19.9*  PLT 90* 87* 84*    Recent Labs Lab 01/02/17 0932 01/03/17 0543 01/04/17 1028 01/05/17 0640  NA  --  139 135 135  K  --  4.9 4.6 4.5  CL  --  101 100* 97*  CO2  --  21* 25 25  BUN  --  125* 49* 81*  CREATININE  --  10.17* 6.75* 8.75*  CALCIUM  --  9.1 8.4* 8.3*  PROT 4.6*  --   --   --   BILITOT 0.5  --   --   --  ALKPHOS 164*  --   --   --   ALT 17  --   --   --   AST 22  --   --   --   GLUCOSE  --  81 85 83   Troponin <0.03 x3   Imaging/Diagnostic Tests: No results found.  Leland Her, DO  01/05/2017, 8:12 AM PGY-1, [place] [facility] FPTS Intern pager: [phone], text pages welcome

## 2017-01-05 NOTE — Anesthesia Preprocedure Evaluation (Addendum)
Anesthesia Evaluation  Patient identified by MRN, date of birth, ID band Patient awake    Reviewed: Allergy & Precautions, NPO status , Patient's Chart, lab work & pertinent test results  Airway Mallampati: II  TM Distance: >3 FB     Dental  (+) Edentulous Upper, Teeth Intact   Pulmonary shortness of breath, pneumonia, Current Smoker,    breath sounds clear to auscultation       Cardiovascular hypertension,  Rhythm:Regular Rate:Normal     Neuro/Psych    GI/Hepatic Neg liver ROS, GERD  ,  Endo/Other    Renal/GU Renal disease     Musculoskeletal  (+) Arthritis ,   Abdominal   Peds  Hematology  (+) anemia ,   Anesthesia Other Findings   Reproductive/Obstetrics                            Anesthesia Physical Anesthesia Plan  ASA: III  Anesthesia Plan: MAC   Post-op Pain Management:    Induction: Intravenous  Airway Management Planned: Simple Face Mask and Mask  Additional Equipment:   Intra-op Plan:   Post-operative Plan:   Informed Consent: I have reviewed the patients History and Physical, chart, labs and discussed the procedure including the risks, benefits and alternatives for the proposed anesthesia with the patient or authorized representative who has indicated his/her understanding and acceptance.   Dental advisory given  Plan Discussed with: CRNA and Anesthesiologist  Anesthesia Plan Comments:         Anesthesia Quick Evaluation

## 2017-01-05 NOTE — Anesthesia Procedure Notes (Signed)
Procedure Name: MAC Date/Time: 01/05/2017 12:15 PM Performed by: Kyung Rudd Pre-anesthesia Checklist: Patient identified, Emergency Drugs available, Suction available and Patient being monitored Patient Re-evaluated:Patient Re-evaluated prior to inductionOxygen Delivery Method: Nasal cannula Intubation Type: IV induction Placement Confirmation: positive ETCO2 Dental Injury: Teeth and Oropharynx as per pre-operative assessment

## 2017-01-05 NOTE — Progress Notes (Signed)
Patient continuing to vomit bright red blood, Hgb 6.6, MD made aware. Orders for transfusion.

## 2017-01-06 ENCOUNTER — Encounter (HOSPITAL_COMMUNITY)
Admission: EM | Disposition: A | Discharge status: Home / Self Care | Payer: Self-pay | Source: Home / Self Care | Attending: Family Medicine

## 2017-01-06 ENCOUNTER — Encounter (HOSPITAL_COMMUNITY): Payer: Self-pay

## 2017-01-06 ENCOUNTER — Encounter (HOSPITAL_COMMUNITY): Payer: Self-pay | Admitting: Hematology

## 2017-01-06 ENCOUNTER — Inpatient Hospital Stay (HOSPITAL_COMMUNITY): Payer: Medicare Other | Admitting: Certified Registered Nurse Anesthetist

## 2017-01-06 HISTORY — PX: ESOPHAGOGASTRODUODENOSCOPY (EGD) WITH PROPOFOL: SHX5813

## 2017-01-06 LAB — RENAL FUNCTION PANEL
ALBUMIN: 2 g/dL — AB (ref 3.5–5.0)
Anion gap: 13 (ref 5–15)
BUN: 109 mg/dL — AB (ref 6–20)
CALCIUM: 7.9 mg/dL — AB (ref 8.9–10.3)
CO2: 23 mmol/L (ref 22–32)
Chloride: 100 mmol/L — ABNORMAL LOW (ref 101–111)
Creatinine, Ser: 10.74 mg/dL — ABNORMAL HIGH (ref 0.61–1.24)
GFR calc Af Amer: 6 mL/min — ABNORMAL LOW (ref >60)
GFR, EST NON AFRICAN AMERICAN: 5 mL/min — AB (ref >60)
GLUCOSE: 85 mg/dL (ref 65–99)
PHOSPHORUS: 5.9 mg/dL — AB (ref 2.5–4.6)
POTASSIUM: 5 mmol/L (ref 3.5–5.1)
SODIUM: 136 mmol/L (ref 135–145)

## 2017-01-06 LAB — PREPARE RBC (CROSSMATCH)

## 2017-01-06 LAB — CBC
HEMATOCRIT: 18.2 % — AB (ref 39.0–52.0)
HEMATOCRIT: 28.1 % — AB (ref 39.0–52.0)
HEMOGLOBIN: 6.2 g/dL — AB (ref 13.0–17.0)
HEMOGLOBIN: 9.5 g/dL — AB (ref 13.0–17.0)
MCH: 30.8 pg (ref 26.0–34.0)
MCH: 29.8 pg (ref 26.0–34.0)
MCHC: 34.1 g/dL (ref 30.0–36.0)
MCHC: 33.8 g/dL (ref 30.0–36.0)
MCV: 90.5 fL (ref 78.0–100.0)
MCV: 88.1 fL (ref 78.0–100.0)
PLATELETS: 78 10*3/uL — AB (ref 150–400)
Platelets: 89 10*3/uL — ABNORMAL LOW (ref 150–400)
RBC: 2.01 MIL/uL — ABNORMAL LOW (ref 4.22–5.81)
RBC: 3.19 MIL/uL — AB (ref 4.22–5.81)
RDW: 17.5 % — AB (ref 11.5–15.5)
RDW: 16 % — ABNORMAL HIGH (ref 11.5–15.5)
WBC: 6 10*3/uL (ref 4.0–10.5)
WBC: 8.6 10*3/uL (ref 4.0–10.5)

## 2017-01-06 LAB — POCT I-STAT 4, (NA,K, GLUC, HGB,HCT)
Glucose, Bld: 79 mg/dL (ref 65–99)
HCT: 30 % — ABNORMAL LOW (ref 39.0–52.0)
Hemoglobin: 10.2 g/dL — ABNORMAL LOW (ref 13.0–17.0)
Potassium: 3.8 mmol/L (ref 3.5–5.1)
Sodium: 137 mmol/L (ref 135–145)

## 2017-01-06 SURGERY — ESOPHAGOGASTRODUODENOSCOPY (EGD) WITH PROPOFOL
Anesthesia: Monitor Anesthesia Care

## 2017-01-06 MED ORDER — MIDAZOLAM HCL 5 MG/5ML IJ SOLN
INTRAMUSCULAR | Status: DC | PRN
  Administered 2017-01-06: 2 mg via INTRAVENOUS

## 2017-01-06 MED ORDER — PANTOPRAZOLE SODIUM 40 MG IV SOLR: 40.0000 mg | Freq: Two times a day (BID) | INTRAVENOUS | Status: DC

## 2017-01-06 MED ORDER — SODIUM CHLORIDE 0.9 % IV SOLN
8.0000 mg/h | INTRAVENOUS | Status: DC
  Administered 2017-01-06 – 2017-01-07 (×3): 8 mg/h via INTRAVENOUS
  Filled 2017-01-06 (×8): qty 80

## 2017-01-06 MED ORDER — PENTAFLUOROPROP-TETRAFLUOROETH EX AERO: 1.0000 "application " | INHALATION_SPRAY | CUTANEOUS | Status: DC | PRN

## 2017-01-06 MED ORDER — PROPOFOL 500 MG/50ML IV EMUL
INTRAVENOUS | Status: DC | PRN
  Administered 2017-01-06: 200 ug/kg/min via INTRAVENOUS

## 2017-01-06 MED ORDER — ALTEPLASE 2 MG IJ SOLR: 2.0000 mg | Freq: Once | INTRAMUSCULAR | Status: DC | PRN

## 2017-01-06 MED ORDER — SODIUM CHLORIDE 0.9 % IV SOLN: Freq: Once | INTRAVENOUS | Status: AC

## 2017-01-06 MED ORDER — FENTANYL CITRATE (PF) 100 MCG/2ML IJ SOLN
INTRAMUSCULAR | Status: DC | PRN
  Administered 2017-01-06: 25 ug via INTRAVENOUS
  Administered 2017-01-06: 50 ug via INTRAVENOUS
  Administered 2017-01-06: 25 ug via INTRAVENOUS

## 2017-01-06 MED ORDER — PHENYLEPHRINE HCL 10 MG/ML IJ SOLN
INTRAVENOUS | Status: DC | PRN
  Administered 2017-01-06: 25 ug/min via INTRAVENOUS

## 2017-01-06 MED ORDER — PROPOFOL 10 MG/ML IV BOLUS
INTRAVENOUS | Status: DC | PRN
  Administered 2017-01-06 (×5): 10 mg via INTRAVENOUS

## 2017-01-06 MED ORDER — SODIUM CHLORIDE 0.9 % IV SOLN: 100.0000 mL | INTRAVENOUS | Status: DC | PRN

## 2017-01-06 MED ORDER — LIDOCAINE-PRILOCAINE 2.5-2.5 % EX CREA: 1.0000 "application " | TOPICAL_CREAM | CUTANEOUS | Status: DC | PRN

## 2017-01-06 MED ORDER — SODIUM CHLORIDE 0.9 % IV SOLN
INTRAVENOUS | Status: DC | PRN
  Administered 2017-01-06: 14:00:00 via INTRAVENOUS

## 2017-01-06 MED ORDER — METOCLOPRAMIDE HCL 5 MG/ML IJ SOLN: 5.0000 mg | INTRAMUSCULAR | Status: AC

## 2017-01-06 MED ORDER — LIDOCAINE HCL (PF) 1 % IJ SOLN: 5.0000 mL | INTRAMUSCULAR | Status: DC | PRN

## 2017-01-06 MED ORDER — EPINEPHRINE PF 1 MG/10ML IJ SOSY
PREFILLED_SYRINGE | INTRAMUSCULAR | Status: DC | PRN
  Administered 2017-01-06: 6.5 mL

## 2017-01-06 MED ORDER — HEPARIN SODIUM (PORCINE) 1000 UNIT/ML DIALYSIS: 1000.0000 [IU] | INTRAMUSCULAR | Status: DC | PRN

## 2017-01-06 MED ORDER — ONDANSETRON HCL 4 MG/2ML IJ SOLN
INTRAMUSCULAR | Status: DC | PRN
  Administered 2017-01-06: 4 mg via INTRAVENOUS

## 2017-01-06 SURGICAL SUPPLY — 14 items

## 2017-01-06 NOTE — Op Note (Signed)
[facility] Patient Name: [name] Procedure Date : 01/06/2017 MRN: [id number] Attending MD: Kathi Der , MD Date of Birth: 07-Feb-1966 CSN: [id number] Age: 51 Admit Type: Inpatient Procedure:                Upper GI endoscopy Indications:              Hematemesis, Melena Providers:                Kathi Der, MD, Roselie Awkward, RN, Beryle Beams, Technician, Glo Herring, CRNA Referring MD:              Medicines:                Sedation Administered by an Anesthesia Professional Complications:            No immediate complications. Estimated Blood Loss:     Estimated blood loss was minimal. Procedure:                Pre-Anesthesia Assessment:                           - Prior to the procedure, a History and Physical                            was performed, and patient medications and                            allergies were reviewed. The patient's tolerance of                            previous anesthesia was also reviewed. The risks                            and benefits of the procedure and the sedation                            options and risks were discussed with the patient.                            All questions were answered, and informed consent                            was obtained. Prior Anticoagulants: The patient has                            taken no previous anticoagulant or antiplatelet                            agents. ASA Grade Assessment: III - A patient with                            severe systemic disease. After reviewing the risks  and benefits, the patient was deemed in                            satisfactory condition to undergo the procedure.                           After obtaining informed consent, the endoscope was                            passed under direct vision. Throughout the                            procedure, the patient's blood pressure, pulse,  and                            oxygen saturations were monitored continuously. The                            EG-[id number] ([id number]) scope was introduced through the                            mouth, and advanced to the second part of duodenum.                            The upper GI endoscopy was technically difficult                            and complex due to poor endoscopic visualization.                            The patient tolerated the procedure well. Scope In: Scope Out: Findings:      No gross lesions were noted in the entire esophagus.      Large amount ofHematin (altered blood/coffee-ground-like material) and       fresh blood was found in the gastric fundus and in the gastric body.       this was suctioned for around 15 to 20 minutes.Patient was found to have       a large vessel with stigmata of recent bleeding as well as oozing of       blood in the fundus. This area was initially treated with 6-1/2 mL of       epinephrine injection. The gold probe cauterization was done.       Subsequently 3 clips were placed. Because of ongoing oozing of blood,       interventional radiology was asked to come to evaluate the patient       during endoscopy. Location of the bleeding lesion was shown to Dr. Irish Lack. Because of ongoing oozing of blood further cauterization was       performed and 2 more clips were placed.hemostasis was achieved.      Scattered mild inflammation was found in the duodenal bulb.      The first portion of the duodenum and second portion of the duodenum       were normal. Impression:               -  No gross lesions in esophagus.                           - Hematin (altered blood/coffee-ground-like                            material) in the gastric fundus and in the gastric                            body.                           - Duodenitis.                           - Normal first portion of the duodenum and second                             portion of the duodenum.                           - No specimens collected. Moderate Sedation:      Moderate (conscious) sedation was personally administered by an       anesthesia professional. The following parameters were monitored: oxygen       saturation, heart rate, blood pressure, and response to care. Recommendation:           - Return patient to hospital ward for ongoing care.                           - NPO.                           - Continue present medications.                           - Refer to an interventional radiologist if                            symptoms persist. Procedure Code(s):        --- Professional ---                           [phone], Esophagogastroduodenoscopy, flexible,                            transoral; diagnostic, including collection of                            specimen(s) by brushing or washing, when performed                            (separate procedure) Diagnosis Code(s):        --- Professional ---                           K92.2, Gastrointestinal hemorrhage, unspecified  K29.80, Duodenitis without bleeding                           K92.0, Hematemesis                           K92.1, Melena (includes Hematochezia) CPT copyright 2016 [employer]. All rights reserved. The codes documented in this report are preliminary and upon coder review may  be revised to meet current compliance requirements. Kathi Der, MD Kathi Der, MD 01/06/2017 3:01:31 PM Number of Addenda: 0

## 2017-01-06 NOTE — Procedures (Signed)
Pt seen on HD.  Note results of upper GI showing blood clots.  Hg 7.9.  Ap 140 Vp 130.  BFR 350 due to increased Vp when put at 400.  He says he is to go for another EGD today to see if clots cleared and a bleeding site can be found though I see no note that mentions that.

## 2017-01-06 NOTE — Anesthesia Preprocedure Evaluation (Addendum)
Anesthesia Evaluation  Patient identified by MRN, date of birth, ID band Patient awake    Reviewed: Allergy & Precautions, NPO status , Patient's Chart, lab work & pertinent test results  Airway Mallampati: I       Dental  (+) Edentulous Upper   Pulmonary Current Smoker,    breath sounds clear to auscultation       Cardiovascular hypertension,  Rhythm:Regular Rate:Normal     Neuro/Psych PSYCHIATRIC DISORDERS Depression  Neuromuscular disease    GI/Hepatic PUD, GERD  Medicated,  Endo/Other  negative endocrine ROS  Renal/GU ESRF and DialysisRenal disease  negative genitourinary   Musculoskeletal  (+) Arthritis , Osteoarthritis,    Abdominal   Peds negative pediatric ROS (+)  Hematology negative hematology ROS (+)   Anesthesia Other Findings   Reproductive/Obstetrics negative OB ROS                            Lab Results  Component Value Date   WBC 6.0 01/06/2017   HGB 10.2 (L) 01/06/2017   HCT 30.0 (L) 01/06/2017   MCV 90.5 01/06/2017   PLT 89 (L) 01/06/2017   Lab Results  Component Value Date   INR 1.25 01/02/2017   INR 1.07 12/07/2016   INR 0.98 08/04/2015   EKG: normal sinus rhythm.  Echo: - Left ventricle: Inferior and posterior lateral hypokinesis The   cavity size was mildly dilated. Systolic function was mildly   reduced. The estimated ejection fraction was in the range of 45%   to 50%. Doppler parameters are consistent with elevated   ventricular end-diastolic filling pressure. - Aortic valve: There was mild regurgitation. Valve area (VTI):   1.83 cm^2. Valve area (Vmax): 1.68 cm^2. Valve area (Vmean): 1.75   cm^2. - Mitral valve: Somewhat unusual calcification of intervalvular   fibrose anterior mitral leafet and subchordal apparatus no MS   despite mild diastolic tenting - Right atrium: ? calcified chiari network vs catheter in RA. - Atrial septum: No defect or  patent foramen ovale was identified.  Anesthesia Physical Anesthesia Plan  ASA: III  Anesthesia Plan: MAC   Post-op Pain Management:    Induction: Intravenous  Airway Management Planned: Natural Airway  Additional Equipment:   Intra-op Plan:   Post-operative Plan: Extubation in OR  Informed Consent: I have reviewed the patients History and Physical, chart, labs and discussed the procedure including the risks, benefits and alternatives for the proposed anesthesia with the patient or authorized representative who has indicated his/her understanding and acceptance.     Plan Discussed with: CRNA  Anesthesia Plan Comments:         Anesthesia Quick Evaluation

## 2017-01-06 NOTE — Progress Notes (Signed)
[facility] Gastroenterology Progress Note  [name] 51 y.o. [birth year]  CC:  GI bleed   Subjective: Patient continues to have melena. EGD yesterday showed blood clot in fundus.  ROS : Positive for black stool. Negative for abdominal pain and vomiting.   Objective: Vital signs in last 24 hours: Vitals:   01/06/17 1216 01/06/17 1337  BP: (!) 92/43 101/63  Pulse: 63 73  Resp: 20 14  Temp: 97.3 F (36.3 C) 97.6 F (36.4 C)    Physical Exam:  General: Alert, Well-developed, well-nourished, pleasant and cooperative in NAD HEENT : NS, AT, EOMI Neck supple. Trachea midline. Sclera - anicteric Lungs: Clear throughout to auscultation. No wheezes, crackles, or rhonchi. No acute distress. Heart: Regular rate and rhythm; no murmurs, clicks, rubs, or gallops. Abdomen: Nontender soft, mild distended, bowel sounds present. No peritoneal sign. LE: no edema  Psych : Mood and affect normal. Alert oriented   Lab Results:  Recent Labs  01/05/17 0640 01/06/17 0841 01/06/17 1333  NA 135 136 137  K 4.5 5.0 3.8  CL 97* 100*  --   CO2 25 23  --   GLUCOSE 83 85 79  BUN 81* 109*  --   CREATININE 8.75* 10.74*  --   CALCIUM 8.3* 7.9*  --   PHOS  --  5.9*  --     Recent Labs  01/06/17 0841  ALBUMIN 2.0*    Recent Labs  01/05/17 1518 01/06/17 0841 01/06/17 1333  WBC 8.1 6.0  --   HGB 7.9* 6.2* 10.2*  HCT 23.5* 18.2* 30.0*  MCV 92.2 90.5  --   PLT 100* 89*  --    No results for input(s): LABPROT, INR in the last 72 hours.    Assessment/Plan: - Melena and hematemesis. EGD yesterday showed blood and blood clots in fundus - Acute blood loss anemia. Status post blood transfusion. - HIV. - Occult blood positive stool - End Stage renal disease on hemodialysis - Thrombocytopenia. - Elevated alkaline phosphatase. Negative previous workup including hepatitis panel, AMA , ANA , ASMA. Iron studies unremarkable. Ultrasound  negative.  Recommendations ------------------------ - EGD today for further evaluation. Risk benefits alternatives discussed with the patient. Verbalized understanding. - Monitor H&H. Continue PPI. - Further plan based on endoscopic finding.   Kathi Der MD, FACP 01/06/2017, 1:56 PM  Pager [phone]  If no answer or after 5 PM call [phone]

## 2017-01-06 NOTE — Progress Notes (Signed)
Critical Lab Hgb 6.2  MD paged and HD RN made aware

## 2017-01-06 NOTE — Progress Notes (Signed)
Family Medicine Teaching Service Daily Progress Note Intern Pager: [phone]  Patient name: [name] Medical record number: [id number] Date of birth: [birth year] Age: 51 y.o. Gender: male  Primary Care Provider: Almon Hercules, MD Consultants: Gastroenterology, Nephrology Code Status: Full  Pt Overview and Major Events to Date:  4/5 Admitted for symptomatic anemia, transfused 2U RBC 4/6 Transfused 1U RBC 4/7 Transfused 2U RBC 4/9 Transfused 1U RBC, EGD 4/10 Transfuse 2U RBC  Assessment and Plan: [name] is a 51 y.o. male presenting with lightheadedness and hypotension after receiving dialysis. 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  #Acute blood loss anemia  Macrocytic anemia  GI bleed: Baseline Hgb 12-13 but underlying macrocytic anemia due to folate deficiency. Had multiple episodes of hematemesis this hospital stay but none today, continues to have melena. s/p 6U pRBC. EGD 4/10 showed acute duodentitis with clotted blood in gastric cardia/fundus. Hgb this am 6.2 Of note, patient's baseline BP is around 90/30s but has continued to be asymptomatic with lower documented BPs.  -- Transfuse 2U pRBC this morning --GI following, appreciate recommendations. Per patient, repeat EGD scheduled for today Tues 4/10. --Folic acid tablet 1 mg QD --Continue protonix 40 mg BID  --Monitor CBC --Monitor BP, have asked RN to check BP in leg since L arm has AV fistula and R arm has old graft.  #End-stage renal disease: Secondary to ADPCKD. HD T/Th/Sat, followed by [name]. Has mature graft on left arm. --HD today 4/10 --Nephro following, appreciate recs   --PhosLo BID w/ meals  #HIV: Followed by [name]. Last CD4 ct 990, with viral load 250 as of 11/2016.  --Continue ART therapy: Tivicay, Norvir,  Viread, Prezista  #GERD:  -- Continue home protonix 40 mg BID --Zofran PRN  #Tobacco use disorder: Has 13-pack-year history. Currently smokes 3/4 pack daily. 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 --Holding Valtrex 500 mg QD PRN  #Depression: Denies feelings of depression --Monitor outpatient    FEN/GI: NPO except sips with meds, SLIV, PPI Prophylaxis: SCDs  Disposition: Pending repeat EGD   Subjective:  Patient states continues to be tired this morning, feels hungry. He stated he had no further episodes of vomiting blood since yesterday. Denies lightheadedness, dizziness, CP, palpitations, SOB, nausea. Is hopeful about repeat EGD today.   Objective: Temp:  [97.5 F (36.4 C)-98.4 F (36.9 C)] 98.4 F (36.9 C) (04/10 0822) Pulse Rate:  [61-79] 67 (04/10 0900) Resp:  [14-22] 16 (04/10 0900) BP: (78-145)/(12-91) 110/65 (04/10 0900) SpO2:  [98 %-100 %] 100 % (04/09 2134) Weight:  [73 kg (161 lb)-74 kg (163 lb 2.3 oz)] 74 kg (163 lb 2.3 oz) (04/10 4696) Physical Exam: General: lying in bed comfortably, NAD Cardiovascular: regular, normal S1 and S2, holosystolic murmur without rubs, or gallops Respiratory: CTAB, normal effort on room air Abdomen: soft, nontender, nondistended, + bowel sounds Extremities: warm and well perfused, no edema  Laboratory:  Recent Labs Lab 01/05/17 0640 01/05/17 1518 01/06/17 0841  WBC 6.3 8.1 6.0  HGB 6.6* 7.9* 6.2*  HCT 19.9* 23.5* 18.2*  PLT 84* 100* 89*    Recent Labs Lab 01/02/17 0932  01/04/17 1028 01/05/17 0640 01/06/17 0841  NA  --   < > 135 135 136  K  --   < > 4.6 4.5 5.0  CL  --   < >  100* 97* 100*  CO2  --   < > BUN  --   < > 49* 81* 109*  CREATININE  --   < > 6.75* 8.75* 10.74*  CALCIUM  --   < > 8.4* 8.3* 7.9*  PROT 4.6*  --   --   --   --   BILITOT 0.5  --   --   --   --   ALKPHOS 164*  --   --   --   --   ALT 17  --   --   --   --   AST 22  --   --   --    --   GLUCOSE  --   < > 85 83 85  < > = values in this interval not displayed.   Imaging/Diagnostic Tests: No results found.  Caleb Her, DO  01/06/2017, 9:33 AM PGY-1, [facility] FPTS Intern pager: [phone], text pages welcome

## 2017-01-06 NOTE — Transfer of Care (Signed)
Immediate Anesthesia Transfer of Care Note  Patient: [name]  Procedure(s) Performed: Procedure(s): ESOPHAGOGASTRODUODENOSCOPY (EGD) WITH PROPOFOL (N/A)  Patient Location: Endoscopy Unit  Anesthesia Type:MAC  Level of Consciousness: awake and alert   Airway & Oxygen Therapy: Patient Spontanous Breathing and Patient connected to nasal cannula oxygen  Post-op Assessment: Report given to RN and Post -op Vital signs reviewed and stable  Post vital signs: Reviewed and stable  Last Vitals:  Vitals:   01/06/17 1337 01/06/17 1455  BP: 101/63 (!) 91/47  Pulse: 73 84  Resp: 14 (!) 25  Temp: 36.4 C 36.4 C    Last Pain:  Vitals:   01/06/17 1455  TempSrc: Oral  PainSc:       Patients Stated Pain Goal: 1 (68/03/21 2248)  Complications: No apparent anesthesia complications

## 2017-01-06 NOTE — Care Management Important Message (Signed)
Important Message  Patient Details  Name: [name] MRN: [id number] Date of Birth: 1966/06/02   Medicare Important Message Given:  Yes    Dorena Bodo 01/06/2017, 12:29 PM

## 2017-01-06 NOTE — Consult Note (Signed)
Chief Complaint: Patient was seen in consultation today for possible mesenteric arteriogram with embolization if needed Chief Complaint  Patient presents with  . Near Syncope   at the request of Dr Boyd Kerbs  Referring Physician(s): Dr Boyd Kerbs  Supervising Physician: Irish Lack  Patient Status: [facility] - In-pt  History of Present Illness: [name] is a 51 y.o. male   Presented with light headedness; hypotension after dialysis 4/5 ESRD; HIV Recent hospitalization for melena (12/07/2016) Acute anemia- blood loss EGD shows gastroduodenitis; -  Continues to have melena- requiring blood transfusion Hg 6.2 this am  4/10: EGD showed large visible vessel with active bleeding in the fundus. - This area was treated with 6.5  cc of epinephrine injection, Gold probe cautery and total 5 clips placement.  GI requests IR to be aware of pt -- Now clipped and no bleeding; but if rebleeds wants IR to be prepared to move forward with mesenteric arteriogram with embolization   Past Medical History:  Diagnosis Date  . Anal condyloma   . Anemia   . Diarrhea    occurs because of dialysis  . DJD (degenerative joint disease)    right knee.  All joints  . ESRD (end stage renal disease) on dialysis (HCC) 01/02/2012   Gets diaysis at [facility] on [employer], TTS schedule.  Started dialysis in 2000.     . Family history of disseminated HSV infection   . Genital herpes   . Hemodialysis patient [facility])    Tuesday, Thursday, Saturday  . History of blood transfusion   . HIV infection (HCC)   . Hypertension   . Pneumonia 1/20107   hx of  . Shortness of breath    "can happen at anytime" (03/04/2013).  03/14/16- "when I have too much fluid"  . Tertiary syphilis   . 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 Reclotted on 03/02/13 and IR placed L IJ tunneled HD catheter (a right external jugular tunneled HD cath was  attempted first but did not function due to kinking across the clavicle). Pt was seen by VVS on same admission 6/614 and said RUA AVF no longer usable, they ordered vein mapping and will see him as outpatient to set up next permanent access.       Past Surgical History:  Procedure Laterality Date  . APPENDECTOMY    . AV FISTULA PLACEMENT Right 06/16/11  . AV FISTULA PLACEMENT Left 03/17/2016   Procedure: INSERTION OF ARTERIOVENOUS (AV) GORE-TEX GRAFT ARM USING GORETEX 4-7MM X 45CM STRETCH GRAFT;  Surgeon: Sherren Kerns, MD;  Location: [facility] OR;  Service: Vascular;  Laterality: Left;  . AV FISTULA PLACEMENT, BRACHIOCEPHALIC Right 06/16/11  . ESOPHAGOGASTRODUODENOSCOPY N/A 12/09/2016   Procedure: ESOPHAGOGASTRODUODENOSCOPY (EGD);  Surgeon: Kathi Der, MD;  Location: [facility] ENDOSCOPY;  Service: Gastroenterology;  Laterality: N/A;  . ESOPHAGOGASTRODUODENOSCOPY N/A 01/05/2017   Procedure: ESOPHAGOGASTRODUODENOSCOPY (EGD);  Surgeon: Vida Rigger, MD;  Location: [facility] ENDOSCOPY;  Service: Endoscopy;  Laterality: N/A;  . INCISION AND DRAINAGE ABSCESS     abdominal  . PERIPHERAL VASCULAR CATHETERIZATION N/A 03/07/2016   Procedure: Venogram;  Surgeon: Sherren Kerns, MD;  Location: [facility] INVASIVE CV LAB;  Service: Cardiovascular;  Laterality: N/A;  . REVISON OF ARTERIOVENOUS FISTULA Right 04/20/2013   Procedure: INSERTION OF ARTERIOVENOUS GORTEX GRAFT;  Surgeon: Sherren Kerns, MD;  Location: [facility] OR;  Service: Vascular;  Laterality: Right;  Ultrasound Guided  .  TONSILLECTOMY      Allergies: Penicillins; Contrast media [iodinated diagnostic agents]; Acetaminophen-codeine; Oxycodone-acetaminophen; and Tape  Medications: Prior to Admission medications   Medication Sig Start Date End Date Taking? Authorizing Provider  acetaminophen (TYLENOL) 500 MG tablet Take 1,000 mg by mouth every 6 (six) hours as needed (pain or headaches).    Yes Historical Provider, MD  albuterol (PROVENTIL HFA;VENTOLIN HFA) 108 (90 Base)  MCG/ACT inhaler Inhale 2 puffs into the lungs every 6 (six) hours as needed for wheezing or shortness of breath.   Yes Historical Provider, MD  calcium acetate (PHOSLO) 667 MG capsule Take 2,668 mg by mouth See admin instructions. Two to three times a day with meals and 2,668 mg (also) with each snack   Yes Historical Provider, MD  darunavir (PREZISTA) 800 MG tablet TAKE 1 TABLET BY MOUTH AT BEDTIME WITH NORVIR 12/29/16  Yes Cliffton [name], MD  docusate sodium (COLACE) 100 MG capsule Take 1 capsule (100 mg total) by mouth daily. 12/10/16  Yes Renne Musca, MD  dolutegravir (TIVICAY) 50 MG tablet Take 1 tablet (50 mg total) by mouth at bedtime. 12/29/16  Yes Cliffton [name], MD  nystatin (MYCOSTATIN/NYSTOP) powder Apply topically 3 (three) times daily. For 14 days Patient taking differently: Apply topically 3 (three) times daily. To affected areas of the feet 12/10/16  Yes Renne Musca, MD  pantoprazole (PROTONIX) 40 MG tablet TAKE 1 TABLET BY MOUTH TWICE DAILY 12/11/16  Yes Almon Hercules, MD  ritonavir (NORVIR) 100 MG TABS tablet TAKE 1 TABLET BY MOUTH AT BEDTIME. TAKE WITH PREZISTA TABLET 12/29/16  Yes Cliffton [name], MD  tenofovir (VIREAD) 300 MG tablet TAKE 1 TABLET BY MOUTH ONCE A WEEK ON SUNDAYS AT BEDTIME 12/29/16  Yes Cliffton [name], MD  valACYclovir (VALTREX) 500 MG tablet Take 1 tablet (500 mg total) by mouth daily as needed (herpes outbreaks). 09/01/16  Yes Cliffton [name], MD     Family History  Problem Relation Age of Onset  . Diabetes Mother   . Arthritis Father     Gout- Foot    Social History   Social History  . Marital status: Married    Spouse name: N/A  . Number of children: N/A  . Years of education: N/A   Social History Main Topics  . Smoking status: Heavy Tobacco Smoker    Packs/day: 0.50    Years: 27.00    Types: Cigarettes  . Smokeless tobacco: Never Used     Comment: cutting back from last visit  . Alcohol use No     Comment: 03/04/2013 "stopped drinking ~ 12 yr ago; never  had problem w/it".    . Drug use: No  . Sexual activity: Not Currently    Partners: Female     Comment: declined condoms   Other Topics Concern  . None   Social History Narrative  . None     Review of Systems: A 12 point ROS discussed and pertinent positives are indicated in the HPI above.  All other systems are negative.  Review of Systems  Constitutional: Positive for activity change, appetite change and fatigue. Negative for fever.  Respiratory: Positive for shortness of breath.   Gastrointestinal: Positive for blood in stool.  Neurological: Positive for weakness.  Psychiatric/Behavioral: Negative for behavioral problems and confusion.    Vital Signs: BP (!) 77/32   Pulse 70   Temp 97.6 F (36.4 C) (Oral)   Resp 19   Ht 6' (1.829 m)   Wt 163 lb 2.3  oz (74 kg)   SpO2 99%   BMI 22.13 kg/m   Physical Exam  Cardiovascular: Normal rate, regular rhythm and normal heart sounds.   Pulmonary/Chest: Effort normal.  Abdominal: Soft. Bowel sounds are normal.  Musculoskeletal:  Just back from Endo---unable to follow many commands  Skin: Skin is warm and dry.  Psychiatric:  Consented with wife at bedside  Nursing note and vitals reviewed.   Mallampati Score:  MD Evaluation Airway: WNL Heart: WNL Abdomen: WNL Chest/ Lungs: WNL ASA  Classification: 3 Mallampati/Airway Score: Two  Imaging: Ct Head Wo Contrast  Result Date: 12/08/2016 CLINICAL DATA:  Altered mental status. History of hypertension and HIV. EXAM: CT HEAD WITHOUT CONTRAST TECHNIQUE: Contiguous axial images were obtained from the base of the skull through the vertex without intravenous contrast. COMPARISON:  None. FINDINGS: Brain: Diffuse cerebral atrophy. No ventricular dilatation. Low-attenuation changes in the deep white matter consistent with small vessel ischemic change or other demyelinating process. Areas of encephalomalacia in the occipital lobes bilaterally may indicate old infarcts. Could also  consider possibility of posterior reversible encephalopathy syndrome. Parenchymal calcification may indicate dystrophic change or old infection. Meningeal calcifications on the falx and tentorium also could indicate postinflammatory change. No mass-effect or midline shift. Gray-white matter junctions are distinct. Basal cisterns are not effaced. No acute intracranial hemorrhage. Vascular: Vascular calcifications are present. Skull: Calvarium appears intact. Sinuses/Orbits: Mucosal thickening in the paranasal sinuses. No acute air-fluid levels. Prominent aeration of mastoid air cells, extending into the temporal bones. No mastoid effusions. Other: None. IMPRESSION: Diffuse cerebral atrophy. Parenchyma wall and meningeal calcifications could represent dystrophic change or old inflammatory process. Low-attenuation changes in the occipital lobes could represent old infarcts or possibly posterior reversible encephalopathy syndrome. Consider MRI if clinically indicated. Electronically Signed   By: Burman Nieves M.D.   On: 12/08/2016 05:59   Dg Abd Acute W/chest  Result Date: 12/08/2016 CLINICAL DATA:  Initial evaluation for acute epigastric abdominal pain. EXAM: DG ABDOMEN ACUTE W/ 1V CHEST COMPARISON:  Prior radiograph from 11/08/2015. FINDINGS: Cardiac and mediastinal silhouettes are within normal limits. Lungs are mildly hypoinflated. Minimal patchy densities overlying the lung bases on frontal view the chest do not persists on view of the upper abdomen, and are likely related to bronchovascular crowding. No focal infiltrates. No pulmonary edema or pleural effusion. No pneumothorax. Vascular stents overlie the left subclavian and right axillary region. Bowel gas pattern within normal limits without evidence for obstruction or ileus. No abnormal bowel wall thickening. No free air. No soft tissue mass. Probable calcified gallstone noted within the right upper quadrant. Extensive atherosclerosis noted. No acute  osseous abnormality. IMPRESSION: 1. Nonobstructive bowel gas pattern with no radiographic evidence for acute intra-abdominal process. 2. No active cardiopulmonary disease. 3. Cholelithiasis. 4. Extensive atherosclerosis. Electronically Signed   By: Rise Mu M.D.   On: 12/08/2016 01:57   Dg Foot 2 Views Left  Result Date: 12/09/2016 CLINICAL DATA:  Pain laterally with swelling and soft tissue ulceration. End-stage renal disease. EXAM: LEFT FOOT - 2 VIEW COMPARISON:  None. FINDINGS: Frontal and lateral views obtained. No evident fracture or dislocation. There is extensive arterial vascular calcification. Joint spaces appear normal. No erosive change or bony destruction. No soft tissue abscess evident radiographically. IMPRESSION: Extensive arterial vascular calcification. No fracture or dislocation. No erosive change or bony destruction. No appreciable arthropathic change. No soft tissue abscess evident by radiography. Electronically Signed   By: Bretta Bang III M.D.   On: 12/09/2016 14:58   US  Abdomen Limited Ruq  Result Date: 12/08/2016 CLINICAL DATA:  Abnormal LFTs EXAM: US ABDOMEN LIMITED - RIGHT UPPER QUADRANT COMPARISON:  CT 10/13/2015 FINDINGS: Gallbladder: Calcified mobile gallstone measuring up to 1.6 cm. Negative sonographic Murphy's. Normal wall thickness. Common bile duct: Diameter: Normal at 3.7 mm Liver: No focal lesion identified. Within normal limits in parenchymal echogenicity. Incidentally noted is an echogenic right kidney containing multiple cysts. IMPRESSION: 1. Cholelithiasis without sonographic evidence for acute cholecystitis or biliary dilatation 2. Echogenic right kidney containing multiple cysts Electronically Signed   By: Jasmine Pang M.D.   On: 12/08/2016 19:56    Labs:  CBC:  Recent Labs  01/04/17 1818 01/05/17 0640 01/05/17 1518 01/06/17 0841 01/06/17 1333  WBC 6.2 6.3 8.1 6.0  --   HGB 7.0* 6.6* 7.9* 6.2* 10.2*  HCT 21.1* 19.9* 23.5* 18.2*  30.0*  PLT 87* 84* 100* 89*  --     COAGS:  Recent Labs  12/07/16 2120 01/02/17 0932  INR 1.07 1.25  APTT 31 36    BMP:  Recent Labs  01/03/17 0543 01/04/17 1028 01/05/17 0640 01/06/17 0841 01/06/17 1333  NA 139 135 135 136 137  K 4.9 4.6 4.5 5.0 3.8  CL 101 100* 97* 100*  --   CO2 21* --   GLUCOSE 81 85 83 85 79  BUN 125* 49* 81* 109*  --   CALCIUM 9.1 8.4* 8.3* 7.9*  --   CREATININE 10.17* 6.75* 8.75* 10.74*  --   GFRNONAA 5* 9* 6* 5*  --   GFRAA 6* 10* 7* 6*  --     LIVER FUNCTION TESTS:  Recent Labs  12/07/16 2120 01/02/17 0359 01/02/17 0932 01/03/17 0543 01/06/17 0841  BILITOT 0.6  --  0.5  --   --   AST 20  --  22  --   --   ALT 25  --  17  --   --   ALKPHOS 261*  --  164*  --   --   PROT 7.9  --  4.6*  --   --   ALBUMIN 3.0* 2.1* 2.1* 2.0* 2.0*    TUMOR MARKERS: No results for input(s): AFPTM, CEA, CA199, CHROMGRNA in the last 8760 hours.  Assessment and Plan:  GI bleed Gastric ulcer Clipped in Endo today--- no active bleed now IR aware of pt if were to rebleed Ready for mesenteric arteriogram with embolization if necessary Risks and Benefits discussed with the patient's wife including, but not limited to bleeding, infection, vascular injury or contrast induced renal failure. All of her questions were answered, she is agreeable to proceed. Consent signed and in chart.  Thank you for this interesting consult.  I greatly enjoyed meeting [name] and look forward to participating in their care.  A copy of this report was sent to the requesting provider on this date.  Electronically Signed: Ralene Muskrat A 01/06/2017, 3:59 PM   I spent a total of 40 Minutes    in face to face in clinical consultation, greater than 50% of which was counseling/coordinating care for gi bleed--- embolization if needed

## 2017-01-06 NOTE — Anesthesia Postprocedure Evaluation (Addendum)
Anesthesia Post Note  Patient: [name]  Procedure(s) Performed: Procedure(s) (LRB): ESOPHAGOGASTRODUODENOSCOPY (EGD) WITH PROPOFOL (N/A)  Patient location during evaluation: PACU Anesthesia Type: MAC Level of consciousness: awake and alert Pain management: pain level controlled Vital Signs Assessment: post-procedure vital signs reviewed and stable Respiratory status: spontaneous breathing, nonlabored ventilation, respiratory function stable and patient connected to nasal cannula oxygen Cardiovascular status: stable and blood pressure returned to baseline Anesthetic complications: no       Last Vitals:  Vitals:   01/06/17 1524 01/06/17 1525  BP: (!) 78/39 (!) 77/32  Pulse: 73 70  Resp: 18 19  Temp:      Last Pain:  Vitals:   01/06/17 1504  TempSrc: Oral  PainSc:                  [name]

## 2017-01-06 NOTE — Anesthesia Procedure Notes (Signed)
Procedure Name: MAC Date/Time: 01/06/2017 2:00 PM Performed by: Merrilyn Puma B Pre-anesthesia Checklist: Patient identified, Emergency Drugs available, Suction available, Patient being monitored and Timeout performed Patient Re-evaluated:Patient Re-evaluated prior to inductionOxygen Delivery Method: Nasal cannula Intubation Type: IV induction Placement Confirmation: positive ETCO2 and breath sounds checked- equal and bilateral Dental Injury: Teeth and Oropharynx as per pre-operative assessment

## 2017-01-06 NOTE — Brief Op Note (Signed)
01/01/2017 - 01/06/2017  3:02 PM  PATIENT:  [name]  51 y.o. male  PRE-OPERATIVE DIAGNOSIS:  GI bleed  POST-OPERATIVE DIAGNOSIS:  bleeding from Visible vessel, 5 clips placed, bicap and epi injected  PROCEDURE:  Procedure(s): ESOPHAGOGASTRODUODENOSCOPY (EGD) WITH PROPOFOL (N/A)  SURGEON:  Surgeon(s) and Role:    * Kathi Der, MD - Primary   Findings/recommendations ----------------------------------- - EGD showed large visible vessel with active bleeding in the fundus. - This area was treated with 6.5  cc of epinephrine injection, Gold probe cautery and total 5 clips placement. - Case D/W interventional radiologist Dr. Irish Lack, who was present in the endoscopy suite during the end of the procedure.  - Recommend IR embolization if ongoing bleeding.  - GI will follow - Keep NPO for now

## 2017-01-07 DIAGNOSIS — R011 Cardiac murmur, unspecified: Secondary | ICD-10-CM

## 2017-01-07 DIAGNOSIS — K920 Hematemesis: Secondary | ICD-10-CM

## 2017-01-07 LAB — BASIC METABOLIC PANEL
Anion gap: 10 (ref 5–15)
BUN: 45 mg/dL — AB (ref 6–20)
CHLORIDE: 100 mmol/L — AB (ref 101–111)
CO2: 27 mmol/L (ref 22–32)
CREATININE: 6.63 mg/dL — AB (ref 0.61–1.24)
Calcium: 8.2 mg/dL — ABNORMAL LOW (ref 8.9–10.3)
GFR calc Af Amer: 10 mL/min — ABNORMAL LOW (ref >60)
GFR calc non Af Amer: 9 mL/min — ABNORMAL LOW (ref >60)
Glucose, Bld: 69 mg/dL (ref 65–99)
Potassium: 4 mmol/L (ref 3.5–5.1)
SODIUM: 137 mmol/L (ref 135–145)

## 2017-01-07 LAB — TYPE AND SCREEN
ABO/RH(D): B POS
Antibody Screen: NEGATIVE
UNIT DIVISION: 0
UNIT DIVISION: 0
Unit division: 0

## 2017-01-07 LAB — BPAM RBC
BLOOD PRODUCT EXPIRATION DATE: 201805092359
Blood Product Expiration Date: 201804262359
Blood Product Expiration Date: 201805092359
ISSUE DATE / TIME: 201804091118
ISSUE DATE / TIME: 201804101052
ISSUE DATE / TIME: 201804101052
Unit Type and Rh: 1700
Unit Type and Rh: 7300
Unit Type and Rh: 7300

## 2017-01-07 LAB — CBC
HCT: 27.9 % — ABNORMAL LOW (ref 39.0–52.0)
HEMOGLOBIN: 9.3 g/dL — AB (ref 13.0–17.0)
MCH: 29.5 pg (ref 26.0–34.0)
MCHC: 33.3 g/dL (ref 30.0–36.0)
MCV: 88.6 fL (ref 78.0–100.0)
Platelets: 87 10*3/uL — ABNORMAL LOW (ref 150–400)
RBC: 3.15 MIL/uL — ABNORMAL LOW (ref 4.22–5.81)
RDW: 16.6 % — AB (ref 11.5–15.5)
WBC: 7.9 10*3/uL (ref 4.0–10.5)

## 2017-01-07 MED ORDER — EPINEPHRINE PF 1 MG/10ML IJ SOSY
PREFILLED_SYRINGE | INTRAMUSCULAR | Status: AC
  Filled 2017-01-07: qty 10

## 2017-01-07 MED ORDER — DOXERCALCIFEROL 4 MCG/2ML IV SOLN
4.0000 ug | INTRAVENOUS | Status: DC
  Administered 2017-01-08: 4 ug via INTRAVENOUS
  Filled 2017-01-07: qty 2

## 2017-01-07 NOTE — Progress Notes (Signed)
[facility] Progress Note  TOBIAS AVITABILE 51 y.o. 1966-01-13  CC:  GI bleed   Subjective:  EGD yesterday showed large visible vessel with active bleeding. Status post treatment with epinephrine injection, cautery and 5 clip placement. Patient with no bowel movement since yesterday. Denied any vomiting. Denied abdominal pain. Wants to eat. Hemoglobin stable.  ROS :  Negative for abdominal pain and vomiting.   Objective: Vital signs in last 24 hours: Vitals:   01/06/17 2131 01/07/17 0448  BP: (!) 97/48 (!) 97/38  Pulse: 66 74  Resp: 19 18  Temp: 97.5 F (36.4 C) 97.6 F (36.4 C)    Physical Exam:  General: Alert, Well-developed, well-nourished, pleasant and cooperative in NAD HEENT : NS, AT, EOMI Neck supple. Trachea midline. Sclera - anicteric Lungs: Clear throughout to auscultation. No wheezes, crackles, or rhonchi. No acute distress. Heart: Regular rate and rhythm; no murmurs, clicks, rubs, or gallops. Abdomen: Nontender soft, mild distended, bowel sounds present. No peritoneal sign. LE: no edema  Psych : Mood and affect normal. Alert oriented   Lab Results:  Recent Labs  01/06/17 0841 01/06/17 1333 01/07/17 0359  NA 136 137 137  K 5.0 3.8 4.0  CL 100*  --  100*  CO2 23  --  27  GLUCOSE 85 79 69  BUN 109*  --  45*  CREATININE 10.74*  --  6.63*  CALCIUM 7.9*  --  8.2*  PHOS 5.9*  --   --     Recent Labs  01/06/17 0841  ALBUMIN 2.0*    Recent Labs  01/06/17 1930 01/07/17 0359  WBC 8.6 7.9  HGB 9.5* 9.3*  HCT 28.1* 27.9*  MCV 88.1 88.6  PLT 78* 87*   No results for input(s): LABPROT, INR in the last 72 hours.    Assessment/Plan: - Melena and hematemesis. EGD yesterday showed Large visible vessel in the fundus. Status post endoscopic treatment. - Acute blood loss anemia. Status post blood transfusion. - HIV. - Occult blood positive stool - End Stage renal disease on hemodialysis - Thrombocytopenia. - Elevated  alkaline phosphatase. Negative previous workup including hepatitis panel, AMA , ANA , ASMA. Iron studies unremarkable. Ultrasound negative.  Recommendations ------------------------ - Continue protonix drip for today. Appreciate interventional radiology for prompt evaluation. - Patient with no further active bleeding. Hemoglobin stable. Start full liquid diet, advance as tolerated. - Monitor hemoglobin. He will need IR embolization if starts bleeding again.  - GI will follow.   Kathi Der MD, FACP 01/07/2017, 9:02 AM  Pager [phone]  If no answer or after 5 PM call [phone]

## 2017-01-07 NOTE — Progress Notes (Signed)
Family Medicine Teaching Service Daily Progress Note Intern Pager: [phone]  Patient name: [name] Medical record number: [id number] Date of birth: 1966-02-20 Age: 51 y.o. Gender: male  Primary Care Provider: Almon Hercules, MD Consultants: Gastroenterology, Nephrology Code Status: Full  Pt Overview and Major Events to Date:  4/5 Admitted for symptomatic anemia, transfused 2U RBC 4/6 Transfused 1U RBC 4/7 Transfused 2U RBC 4/9 Transfused 1U RBC, EGD 4/10 Transfused 2U RBC, repeat EGD  Assessment and Plan: [name] is a 51 y.o. male presenting with lightheadedness and hypotension after receiving dialysis. 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  #Acute blood loss anemia  Macrocytic anemia  GI bleed, improving: s/p 8U pRBC. Hgb this am 9.3 Repeat EGD 4/10 showed large visible vessel with active bleeding in the fundus, treated with epinephrine injection and total 5 clips placement.  --GI following, appreciate recommendations. Full liquid diet, ADAT. IV Protonix --Interventional radiology aware in case of re-bleeding, for possible arteriography and embolization  --Folic acid tablet 1 mg QD --Continue protonix 40 mg BID  --Monitor CBC  #End-stage renal disease: Secondary to ADPCKD. HD T/Th/Sat, followed by Dr Gala [facility]. Has mature graft on left arm. --Nephro following, appreciate recs   --PhosLo BID w/ meals  Low blood pressure, asymptomatic. Of note, patient's baseline BP is around 90/30s but has continued to be asymptomatic with lower documented BPs.  --Monitor BP, have asked RN to check BP in leg since L arm has AV fistula and R arm has old graft.  --manual BP today: 90/50 in R arm  #HIV: Followed by Dr Orvan Falconer. Last CD4 ct 990, with viral load 250 as of 11/2016.  --Continue ART therapy:  Tivicay, Norvir, Viread, Prezista  #GERD:  -- Continue home protonix 40 mg BID --Zofran PRN  #Tobacco use disorder: Has 13-pack-year history. Currently smokes 3/4 pack daily. 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 --Holding Valtrex 500 mg QD PRN  #Depression: Denies feelings of depression --Monitor outpatient    FEN/GI: Full liquid diet, ADAT. SLIV, PPI Prophylaxis: SCDs  Disposition: Likely tomorrow if Hgb stable.  Subjective:  Patient states feels well this morning, is hungry. Has no concerns. Denies lightheadedness, dizziness, CP, palpitations, SOB, nausea, vomiting, melena.  Objective: Temp:  [97.3 F (36.3 C)-98.4 F (36.9 C)] 97.6 F (36.4 C) (04/11 0448) Pulse Rate:  [60-84] 74 (04/11 0448) Resp:  [11-27] 18 (04/11 0448) BP: (75-126)/(20-65) 97/38 (04/11 0448) SpO2:  [98 %-100 %] 100 % (04/11 0448) Weight:  [156 lb 9.6 oz (71 kg)-163 lb 2.3 oz (74 kg)] 156 lb 9.6 oz (71 kg) (04/10 2131) Physical Exam: General: lying in bed comfortably, NAD Cardiovascular: regular, normal S1 and S2, holosystolic murmur without rubs, or gallops Respiratory: CTAB, normal effort on room air Abdomen: soft, nontender, nondistended, + bowel sounds Extremities: warm and well perfused, no edema   Laboratory:  Recent Labs Lab 01/06/17 0841 01/06/17 1333 01/06/17 1930 01/07/17 0359  WBC 6.0  --  8.6 7.9  HGB 6.2* 10.2* 9.5* 9.3*  HCT 18.2* 30.0* 28.1* 27.9*  PLT 89*  --  78* 87*    Recent Labs Lab 01/02/17 0932  01/05/17 0640 01/06/17 0841 01/06/17 1333 01/07/17 0359  NA  --   < > 135 136 137 137  K  --   < > 4.5 5.0 3.8  4.0  CL  --   < > 97* 100*  --  100*  CO2  --   < > 25 23  --  27  BUN  --   < > 81* 109*  --  45*  CREATININE  --   < > 8.75* 10.74*  --  6.63*  CALCIUM  --   < > 8.3* 7.9*  --  8.2*  PROT 4.6*  --   --   --   --   --   BILITOT 0.5  --   --   --   --   --   ALKPHOS 164*  --   --   --   --   --   ALT 17   --   --   --   --   --   AST 22  --   --   --   --   --   GLUCOSE  --   < > 83 85 79 69  < > = values in this interval not displayed.   Imaging/Diagnostic Tests: No results found.  Leland Her, DO  01/07/2017, 7:31 AM PGY-1, [facility] FPTS Intern pager: [phone], text pages welcome

## 2017-01-07 NOTE — Progress Notes (Signed)
[facility] Progress Note  Dialysis Orders: [facility] TThS 3h 180F 400/800 2K/2.5 Ca Na linear UF Profile 4 EDW 71kg -LUE AVG recent intervention at [facility] in 10/2016 - referred back for low AF/pain  -No heparin -Hectorol 4 mcg IV q HD -Mircera 150 mcg IV q 2 weeks (last 4/3)   Assessment/Plan: 1. ABLA secondary to GIB - EGD yesterday showed large active bleeding in fundus that was injected and clipped 4/10 by GI - had planned IR embolization if ongoing bleeding.  Hgb has stabilized after 4/10 transfusion.  Will need colonoscopy later at some point;  2. ESRD -TTS - HD tomorrow; K 4l BFR reduced to 350 yesterday to due ^ VP at 400.Continues on NO heparin HD 3. Anemia -hgb 4/10 6.2 tx 2 units -4/10 (8 total)  9.3 today- next Mircera due 4/18 4. Secondary hyperparathyroidism - resume Hectorol 5. HTN/volume - BP low -able to get 2.8 L off yesterday during HD- at EDW 6. Nutrition -advance per GI alb 2 7. HIV - current meds  Sheffield Slider, PA-C [facility] Bee[facility] [phone] 01/07/2017,8:28 AM  LOS: 5 days  I have seen and examined this patient and agree with plan per Bard Herbert.  CO pain in AVG like he had before and had to go to [facility] to get it fixed?  Will try lower BFR to see if helps.  Plan HD tomorrow. Celisse Ciulla T,MD 01/07/2017 9:30 AM Subjective:   Hungry hasn't had much to eat in several days due to acute upper GIB  Objective Vitals:   01/06/17 1525 01/06/17 1718 01/06/17 2131 01/07/17 0448  BP: (!) 77/32 (!) 89/58 (!) 97/48 (!) 97/38  Pulse: 70 72 66 74  Resp: Temp:  97.4 F (36.3 C) 97.5 F (36.4 C) 97.6 F (36.4 C)  TempSrc:  Oral Oral Oral  SpO2: 99% 100% 98% 100%  Weight:   71 kg (156 lb 9.6 oz)   Height:       Physical Exam General: NAD Heart: RRR with ectopy Lungs: no rales Abdomen: soft NT Extremities: no LE edema Dialysis Access:  Left upper AVGG + bruit   Additional  Objective Labs: Basic Metabolic Panel:  Recent Labs Lab 01/02/17 0359 01/03/17 0543  01/05/17 0640 01/06/17 0841 01/06/17 1333 01/07/17 0359  NA 143 139  < > 135 136 137 137  K 4.7 4.9  < > 4.5 5.0 3.8 4.0  CL 102 101  < > 97* 100*  --  100*  CO2 28 21*  < > 25 23  --  27  GLUCOSE 84 81  < > 83 85 79 69  BUN 82* 125*  < > 81* 109*  --  45*  CREATININE 7.97* 10.17*  < > 8.75* 10.74*  --  6.63*  CALCIUM 8.7* 9.1  < > 8.3* 7.9*  --  8.2*  PHOS 4.5  4.5 4.8*  --   --  5.9*  --   --   < > = values in this interval not displayed. Liver Function Tests:  Recent Labs Lab 01/02/17 0932 01/03/17 0543 01/06/17 0841  AST 22  --   --   ALT 17  --   --   ALKPHOS 164*  --   --   BILITOT 0.5  --   --   PROT 4.6*  --   --   ALBUMIN 2.1* 2.0* 2.0*   No results for input(s): LIPASE, AMYLASE in the  last 168 hours. CBC:  Recent Labs Lab 01/05/17 0640 01/05/17 1518 01/06/17 0841 01/06/17 1333 01/06/17 1930 01/07/17 0359  WBC 6.3 8.1 6.0  --  8.6 7.9  HGB 6.6* 7.9* 6.2* 10.2* 9.5* 9.3*  HCT 19.9* 23.5* 18.2* 30.0* 28.1* 27.9*  MCV 92.6 92.2 90.5  --  88.1 88.6  PLT 84* 100* 89*  --  78* 87*   Cardiac Enzymes:  Recent Labs Lab 01/01/17 1800 01/02/17 0359 01/02/17 0932  TROPONINI <0.03 <0.03  <0.03 <0.03   CBG:  Recent Labs Lab 01/01/17 1643  GLUCAP 86   Studies/Results: No results found. Medications: . pantoprozole (PROTONIX) infusion 8 mg/hr (01/07/17 0345)   . darunavir  800 mg Oral Q breakfast  . dolutegravir  50 mg Oral QHS  . folic acid  1 mg Oral Daily  . metoCLOPramide (REGLAN) injection  5 mg Intravenous NOW  . multivitamin  1 tablet Oral QHS  . [START ON 01/10/2017] pantoprazole  40 mg Intravenous Q12H  . ritonavir  100 mg Oral Q breakfast  . sevelamer carbonate  2,400 mg Oral TID WC  . sodium chloride flush  3 mL Intravenous Q12H  . sodium chloride flush  3 mL Intravenous Q12H  . tenofovir  300 mg Oral Q Sun-1800

## 2017-01-08 ENCOUNTER — Other Ambulatory Visit: Payer: Self-pay | Admitting: Family Medicine

## 2017-01-08 ENCOUNTER — Inpatient Hospital Stay (HOSPITAL_COMMUNITY): Payer: Medicare Other

## 2017-01-08 DIAGNOSIS — R011 Cardiac murmur, unspecified: Secondary | ICD-10-CM

## 2017-01-08 DIAGNOSIS — I350 Nonrheumatic aortic (valve) stenosis: Secondary | ICD-10-CM

## 2017-01-08 LAB — RENAL FUNCTION PANEL
Albumin: 2.3 g/dL — ABNORMAL LOW (ref 3.5–5.0)
Anion gap: 8 (ref 5–15)
BUN: 31 mg/dL — ABNORMAL HIGH (ref 6–20)
CO2: 27 mmol/L (ref 22–32)
Calcium: 7.8 mg/dL — ABNORMAL LOW (ref 8.9–10.3)
Chloride: 101 mmol/L (ref 101–111)
Creatinine, Ser: 5.46 mg/dL — ABNORMAL HIGH (ref 0.61–1.24)
GFR calc Af Amer: 13 mL/min — ABNORMAL LOW (ref >60)
GFR calc non Af Amer: 11 mL/min — ABNORMAL LOW (ref >60)
Glucose, Bld: 88 mg/dL (ref 65–99)
Phosphorus: 3.4 mg/dL (ref 2.5–4.6)
Potassium: 3.5 mmol/L (ref 3.5–5.1)
Sodium: 136 mmol/L (ref 135–145)

## 2017-01-08 LAB — ECHOCARDIOGRAM COMPLETE
Height: 72 in
Weight: 2504.43 oz

## 2017-01-08 LAB — CBC
HCT: 26.9 % — ABNORMAL LOW (ref 39.0–52.0)
Hemoglobin: 9.2 g/dL — ABNORMAL LOW (ref 13.0–17.0)
MCH: 30.8 pg (ref 26.0–34.0)
MCHC: 34.2 g/dL (ref 30.0–36.0)
MCV: 90 fL (ref 78.0–100.0)
Platelets: 87 10*3/uL — ABNORMAL LOW (ref 150–400)
RBC: 2.99 MIL/uL — ABNORMAL LOW (ref 4.22–5.81)
RDW: 16.8 % — AB (ref 11.5–15.5)
WBC: 6.6 10*3/uL (ref 4.0–10.5)

## 2017-01-08 LAB — HEPATITIS B SURFACE ANTIGEN: Hepatitis B Surface Ag: NEGATIVE

## 2017-01-08 MED ORDER — ALTEPLASE 2 MG IJ SOLR: 2.0000 mg | Freq: Once | INTRAMUSCULAR | Status: DC | PRN

## 2017-01-08 MED ORDER — SODIUM CHLORIDE 0.9 % IV SOLN: 100.0000 mL | INTRAVENOUS | Status: DC | PRN

## 2017-01-08 MED ORDER — LIDOCAINE-PRILOCAINE 2.5-2.5 % EX CREA: 1.0000 "application " | TOPICAL_CREAM | CUTANEOUS | Status: DC | PRN

## 2017-01-08 MED ORDER — DOXERCALCIFEROL 4 MCG/2ML IV SOLN
INTRAVENOUS | Status: AC
  Filled 2017-01-08: qty 2

## 2017-01-08 MED ORDER — PANTOPRAZOLE SODIUM 40 MG PO TBEC
40.0000 mg | DELAYED_RELEASE_TABLET | Freq: Two times a day (BID) | ORAL | Status: DC
  Administered 2017-01-08: 40 mg via ORAL
  Filled 2017-01-08: qty 1

## 2017-01-08 MED ORDER — PANTOPRAZOLE SODIUM 40 MG PO TBEC: 40.0000 mg | DELAYED_RELEASE_TABLET | Freq: Two times a day (BID) | ORAL | 0 refills | Status: DC

## 2017-01-08 MED ORDER — PENTAFLUOROPROP-TETRAFLUOROETH EX AERO: 1.0000 "application " | INHALATION_SPRAY | CUTANEOUS | Status: DC | PRN

## 2017-01-08 MED ORDER — HEPARIN SODIUM (PORCINE) 1000 UNIT/ML DIALYSIS: 1000.0000 [IU] | INTRAMUSCULAR | Status: DC | PRN

## 2017-01-08 MED ORDER — LIDOCAINE HCL (PF) 1 % IJ SOLN
5.0000 mL | INTRAMUSCULAR | Status: DC | PRN
Start: 2017-01-08 — End: 2017-01-08

## 2017-01-08 NOTE — Procedures (Signed)
Pt seen on HD.  Ap 130 Vp 180. BFR 300 with 16g needles.  Discussed that with smaller needles, time will need to be increased .  Hg stable.  No further bleeding

## 2017-01-08 NOTE — Discharge Instructions (Signed)
It has been a pleasure taking care of you! You were admitted due to a GI bleed that required a scope which found a bleeding blood vessel that has been clipped to stop the bleeding. With that your symptoms improved to the point we think it is safe to let you go home and follow up with your primary care doctor. We are discharging you on protonix (pantoprazole) which is an acid reducer medication that you need to continue taking after you leave the hospital, please take  twice a day until you follow up with Gastroenterology in 3-4weeks. We suspect that you will need to stay on protonix twice a day for a minimum of 6-8 weeks. You will also likely need a colonoscopy as an outpatient.   There could be some changes made to your home medications during this hospitalization. Please, make sure to read the directions before you take them. The names and directions on how to take these medications are found on this discharge paper under medication section.  Please follow up at your primary care doctor's office. The address, date and time are found on the discharge paper under follow up section.

## 2017-01-08 NOTE — Progress Notes (Signed)
Family Medicine Teaching Service Daily Progress Note Intern Pager: [phone]  Patient name: [name] Medical record number: [id number] Date of birth: [birth year] Age: 51 y.o. Gender: male  Primary Care Provider: Almon Hercules, MD Consultants: Gastroenterology, Nephrology Code Status: Full  Pt Overview and Major Events to Date:  4/5 Admitted for symptomatic anemia, transfused 2U RBC 4/6 Transfused 1U RBC 4/7 Transfused 2U RBC 4/9 Transfused 1U RBC, EGD 4/10 Transfused 2U RBC, repeat EGD  Assessment and Plan: LATRAVION GRAVES is a 51 y.o. male presenting with lightheadedness and hypotension after receiving dialysis. 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  #Acute blood loss anemia  Macrocytic anemia  GI bleed, improving: s/p 8U pRBC. Hgb this am stable at 9.2 s/p clip placement to bleeding vessel in fundus via EGD. With holosystolic murmur possibly worsened by anemia --GI following, appreciate recommendations. Protonix po 40 mg BID x 6-8weeks. Follow up as outpatient in 3-4 weeks. Will need outpatient colonoscopy. --Interventional radiology aware in case of re-bleeding, for possible arteriography and embolization  --Folic acid tablet 1 mg QD --Monitor CBC --Echo pending  #End-stage renal disease: Secondary to ADPCKD. HD T/Th/Sat, followed by Dr [facility]. Has mature graft on left arm. --Nephro following, appreciate recs   --PhosLo BID w/ meals  #Low blood pressure, asymptomatic. Of note, patient's baseline BP is around 90/30s but has continued to be asymptomatic with lower documented BPs.  --Monitor BP, have asked RN to check BP in leg since L arm has AV fistula and R arm has old graft.   #HIV: Followed by Dr Orvan Falconer. Last CD4 ct 990, with viral load 250 as of 11/2016.  --Continue ART therapy: Tivicay,  Norvir, Viread, Prezista  #GERD:  -- Continue po protonix 40 mg BID x 6-8weeks --Zofran PRN  #Tobacco use disorder: Has 13-pack-year history. Currently smokes 3/4 pack daily. 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 --Holding Valtrex 500 mg QD PRN  #Depression: Denies feelings of depression --Monitor outpatient    FEN/GI: Renal diet. SLIV, PPI Prophylaxis: SCDs  Disposition: OK to DC if no rebleed after eating lunch  Subjective:  Patient states feels well this morning, is hungry. Has no concerns. Denies lightheadedness, dizziness, CP, palpitations, SOB, nausea, vomiting, melena.  Objective: Temp:  [97.4 F (36.3 C)-98.3 F (36.8 C)] 97.5 F (36.4 C) (04/12 0619) Pulse Rate:  [58-67] 62 (04/12 0619) Resp:  [16-17] 16 (04/12 0619) BP: (94-148)/(27-81) 131/27 (04/12 0619) SpO2:  [100 %] 100 % (04/12 0619) Weight:  [159 lb 12.8 oz (72.5 kg)] 159 lb 12.8 oz (72.5 kg) (04/11 2241) Physical Exam: General: lying in bed comfortably, NAD Cardiovascular: regular, normal S1 and S2, holosystolic murmur without rubs, or gallops Respiratory: CTAB, normal effort on room air Abdomen: soft, nontender, nondistended, + bowel sounds Extremities: warm and well perfused, no edema   Laboratory:  Recent Labs Lab 01/06/17 1930 01/07/17 0359 01/08/17 0447  WBC 8.6 7.9 6.6  HGB 9.5* 9.3* 9.2*  HCT 28.1* 27.9* 26.9*  PLT 78* 87* 87*    Recent Labs Lab 01/02/17 0932  01/05/17 0640 01/06/17 0841 01/06/17 1333 01/07/17 0359  NA  --   < > 135 136 137 137  K  --   < > 4.5 5.0 3.8 4.0  CL  --   < > 97* 100*  --  100*  CO2  --   < > 25 23  --  27  BUN  --   < > 81* 109*  --  45*  CREATININE  --   < > 8.75* 10.74*  --  6.63*  CALCIUM  --   < > 8.3* 7.9*  --  8.2*  PROT 4.6*  --   --   --   --   --   BILITOT 0.5  --   --   --   --   --   ALKPHOS 164*  --   --   --   --   --   ALT 17  --   --   --   --   --   AST 22  --   --   --   --   --    GLUCOSE  --   < > 83 85 79 69  < > = values in this interval not displayed.   Imaging/Diagnostic Tests: No results found.  Leland Her, DO  01/08/2017, 7:30 AM PGY-1,  Family Medicine FPTS Intern pager: [phone], text pages welcome

## 2017-01-08 NOTE — Progress Notes (Signed)
[facility] Progress Note  [name] 51 y.o. 1966/06/07  CC:  GI bleed   Subjective:  Patient seen in dialysis unit. Had dark brown color stool yesterday. No bowel movement today. Feeling hungry. Denied abdominal pain, nausea and vomiting.  ROS :  Negative for abdominal pain and vomiting.   Objective: Vital signs in last 24 hours: Vitals:   01/08/17 0715 01/08/17 0800  BP: (!) 135/44 (!) 109/31  Pulse: 62 62  Resp:  14  Temp:      Physical Exam:  General: Alert, Well-developed, well-nourished, pleasant and cooperative in NAD HEENT : NS, AT, EOMI Neck supple. Trachea midline. Sclera - anicteric Lungs: Clear throughout to auscultation. No wheezes, crackles, or rhonchi. No acute distress. Heart: Regular rate and rhythm; no murmurs, clicks, rubs, or gallops. Abdomen: Nontender soft, mild distended, bowel sounds present. No peritoneal sign. LE: no edema  Psych : Mood and affect normal. Alert oriented   Lab Results:  Recent Labs  01/06/17 0841 01/06/17 1333 01/07/17 0359  NA 136 137 137  K 5.0 3.8 4.0  CL 100*  --  100*  CO2 23  --  27  GLUCOSE 85 79 69  BUN 109*  --  45*  CREATININE 10.74*  --  6.63*  CALCIUM 7.9*  --  8.2*  PHOS 5.9*  --   --     Recent Labs  01/06/17 0841  ALBUMIN 2.0*    Recent Labs  01/07/17 0359 01/08/17 0447  WBC 7.9 6.6  HGB 9.3* 9.2*  HCT 27.9* 26.9*  MCV 88.6 90.0  PLT 87* 87*   No results for input(s): LABPROT, INR in the last 72 hours.    Assessment/Plan: - Melena and hematemesis. EGD yesterday showed Large visible vessel in the fundus. Status post endoscopic treatment.Resolved. Hemoglobin stable. - Acute blood loss anemia. Status post blood transfusion. - HIV. - Occult blood positive stool - End Stage renal disease on hemodialysis - Thrombocytopenia. - Elevated alkaline phosphatase. Negative previous workup including hepatitis panel, AMA , ANA , ASMA. Iron studies unremarkable.  Ultrasound negative.  Recommendations ------------------------ - Patient's hemoglobin is stable. No bowel movement today. - Change Protonix to by mouth twice a day. Continue twice a day PPI for 6-8 weeks. - Advance diet - Monitor hemoglobin. He will need IR embolization if starts bleeding again.  - No further intervention planned from GI standpoint. Okay to discharge from GI standpoint if no further bleeding after advancing diet. - GI will sign off. Follow-up in GI clinic in 3-4 weeks. Recommend outpatient colonoscopy.    Kathi Der MD, FACP 01/08/2017, 8:42 AM  Pager [phone]  If no answer or after 5 PM call [phone]

## 2017-01-09 ENCOUNTER — Inpatient Hospital Stay: Payer: Medicare Other | Admitting: Family Medicine

## 2017-01-13 ENCOUNTER — Inpatient Hospital Stay: Payer: Medicare Other | Admitting: Internal Medicine

## 2017-01-14 ENCOUNTER — Inpatient Hospital Stay: Payer: Medicare Other | Admitting: Family Medicine

## 2017-01-20 ENCOUNTER — Telehealth: Payer: Self-pay | Admitting: Student

## 2017-01-20 NOTE — Telephone Encounter (Signed)
Rang straight to voice mail and left a message requesting to call back. - Mesha Guinyard

## 2017-01-21 NOTE — Addendum Note (Signed)
Addended by: Jennet Maduro D on: 01/21/2017 02:04 PM   Modules accepted: Orders

## 2017-02-04 NOTE — Telephone Encounter (Signed)
Pt states he went to his GI for his hospital f/u. Told him to give us a call if anything changes. - Mesha Guinyard

## 2017-02-05 ENCOUNTER — Other Ambulatory Visit: Payer: Medicare Other

## 2017-02-05 DIAGNOSIS — Z79899 Other long term (current) drug therapy: Secondary | ICD-10-CM

## 2017-02-05 DIAGNOSIS — B20 Human immunodeficiency virus [HIV] disease: Secondary | ICD-10-CM

## 2017-02-05 DIAGNOSIS — Z113 Encounter for screening for infections with a predominantly sexual mode of transmission: Secondary | ICD-10-CM

## 2017-02-05 LAB — CBC WITH DIFFERENTIAL/PLATELET
BASOS ABS: 0 {cells}/uL (ref 0–200)
BASOS PCT: 0 %
EOS ABS: 53 {cells}/uL (ref 15–500)
Eosinophils Relative: 1 %
HEMATOCRIT: 32.6 % — AB (ref 38.5–50.0)
Hemoglobin: 10.3 g/dL — ABNORMAL LOW (ref 13.2–17.1)
Lymphocytes Relative: 43 %
Lymphs Abs: 2279 cells/uL (ref 850–3900)
MCH: 29.1 pg (ref 27.0–33.0)
MCHC: 31.6 g/dL — ABNORMAL LOW (ref 32.0–36.0)
MCV: 92.1 fL (ref 80.0–100.0)
MONO ABS: 424 {cells}/uL (ref 200–950)
MONOS PCT: 8 %
MPV: 9.8 fL (ref 7.5–12.5)
NEUTROS ABS: 2544 {cells}/uL (ref 1500–7800)
Neutrophils Relative %: 48 %
Platelets: 97 10*3/uL — ABNORMAL LOW (ref 140–400)
RBC: 3.54 MIL/uL — ABNORMAL LOW (ref 4.20–5.80)
RDW: 15.4 % — ABNORMAL HIGH (ref 11.0–15.0)
WBC: 5.3 10*3/uL (ref 3.8–10.8)

## 2017-02-05 LAB — COMPREHENSIVE METABOLIC PANEL
ALBUMIN: 3.5 g/dL — AB (ref 3.6–5.1)
ALK PHOS: 215 U/L — AB (ref 40–115)
ALT: 8 U/L — AB (ref 9–46)
AST: 10 U/L (ref 10–40)
BUN: 22 mg/dL (ref 7–25)
CALCIUM: 7.1 mg/dL — AB (ref 8.6–10.3)
CHLORIDE: 102 mmol/L (ref 98–110)
CO2: 24 mmol/L (ref 20–31)
Creat: 12.54 mg/dL — ABNORMAL HIGH (ref 0.60–1.35)
Glucose, Bld: 86 mg/dL (ref 65–99)
POTASSIUM: 4.1 mmol/L (ref 3.5–5.3)
Sodium: 141 mmol/L (ref 135–146)
TOTAL PROTEIN: 6.7 g/dL (ref 6.1–8.1)
Total Bilirubin: 0.4 mg/dL (ref 0.2–1.2)

## 2017-02-05 LAB — LIPID PANEL
CHOL/HDL RATIO: 4 ratio (ref <5.0)
Cholesterol: 190 mg/dL (ref <200)
HDL: 48 mg/dL (ref >40)
LDL Cholesterol: 117 mg/dL — ABNORMAL HIGH (ref <100)
Triglycerides: 125 mg/dL (ref <150)
VLDL: 25 mg/dL (ref <30)

## 2017-02-06 ENCOUNTER — Telehealth: Payer: Self-pay | Admitting: *Deleted

## 2017-02-06 LAB — RPR

## 2017-02-06 LAB — T-HELPER CELL (CD4) - (RCID CLINIC ONLY)
CD4 % Helper T Cell: 32 % — ABNORMAL LOW (ref 33–55)
CD4 T Cell Abs: 810 /uL (ref 400–2700)

## 2017-02-06 NOTE — Telephone Encounter (Signed)
[facility] labs called to report the patient Creat is 12.54. Advised them will make a note and inform the provider.  Patient is dialysis patient.

## 2017-02-07 LAB — HIV-1 RNA QUANT-NO REFLEX-BLD
HIV 1 RNA QUANT: DETECTED {copies}/mL — AB
HIV-1 RNA QUANT, LOG: DETECTED {Log_copies}/mL — AB

## 2017-02-19 ENCOUNTER — Ambulatory Visit: Payer: Medicare Other | Admitting: Internal Medicine

## 2017-02-27 NOTE — Addendum Note (Signed)
Addendum  created 02/27/17 1224 by Killian Schwer D, MD   Sign clinical note    

## 2017-03-12 ENCOUNTER — Encounter: Payer: Self-pay | Admitting: Internal Medicine

## 2017-03-12 ENCOUNTER — Encounter: Payer: Self-pay | Admitting: Vascular Surgery

## 2017-03-12 ENCOUNTER — Ambulatory Visit (INDEPENDENT_AMBULATORY_CARE_PROVIDER_SITE_OTHER): Payer: Medicare Other | Admitting: Internal Medicine

## 2017-03-12 DIAGNOSIS — B20 Human immunodeficiency virus [HIV] disease: Secondary | ICD-10-CM

## 2017-03-12 MED ORDER — DARUNAVIR ETHANOLATE 800 MG PO TABS: ORAL_TABLET | ORAL | 1 refills | Status: DC

## 2017-03-12 MED ORDER — TENOFOVIR DISOPROXIL FUMARATE 300 MG PO TABS: ORAL_TABLET | ORAL | 1 refills | Status: DC

## 2017-03-12 MED ORDER — RITONAVIR 100 MG PO TABS: ORAL_TABLET | ORAL | 1 refills | Status: DC

## 2017-03-12 MED ORDER — DOLUTEGRAVIR SODIUM 50 MG PO TABS: 50.0000 mg | ORAL_TABLET | Freq: Every day | ORAL | 1 refills | Status: DC

## 2017-03-12 NOTE — Assessment & Plan Note (Signed)
His infection remains under excellent, long-term control. He will continue his current regimen and follow-up after lab work in 6 months.

## 2017-03-12 NOTE — Progress Notes (Signed)
Patient Active Problem List   Diagnosis Date Noted  . ESRD (end stage renal disease) on dialysis 01/02/2012    Priority: Medium  . HIV disease ([place]) 10/19/2006    Priority: Medium  . CIGARETTE SMOKER 10/19/2006    Priority: Medium  . Depression 10/19/2006    Priority: Medium  . Nonrheumatic aortic valve stenosis   . PUD (peptic ulcer disease)   . Anemia 01/01/2017  . Toe ulcer ([place])   . GI bleed 12/07/2016  . Gastroesophageal reflux disease 09/13/2016  . Routine adult health maintenance 09/13/2016  . Normocytic anemia 10/14/2015  . Thrombocytopenia ([place]) 10/14/2015  . Gall stone   . 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 PERIPHERAL NEUROPATHY 04/10/2009  . BACTEREMIA, MYCOBACTERIUM AVIUM COMPLEX 10/19/2006  . HERPES, GENITAL NEC 10/19/2006  . WARTS, OTHER SPECIFIED VIRAL 10/19/2006  . SYPHILIS NOS 10/19/2006    Patient's Medications  New Prescriptions   No medications on file  Previous Medications   ACETAMINOPHEN (TYLENOL) 500 MG TABLET    Take 1,000 mg by mouth every 6 (six) hours as needed (pain or headaches).    ALBUTEROL (PROVENTIL HFA;VENTOLIN HFA) 108 (90 BASE) MCG/ACT INHALER    Inhale 2 puffs into the lungs every 6 (six) hours as needed for wheezing or shortness of breath.   CALCIUM ACETATE (PHOSLO) 667 MG CAPSULE    Take 2,668 mg by mouth See admin instructions. Two to three times a day with meals and 2,668 mg (also) with each snack   DARUNAVIR (PREZISTA) 800 MG TABLET    TAKE 1 TABLET BY MOUTH AT BEDTIME WITH NORVIR   DOCUSATE SODIUM (COLACE) 100 MG CAPSULE    Take 1 capsule (100 mg total) by mouth daily.   DOLUTEGRAVIR (TIVICAY) 50 MG TABLET    Take 1 tablet (50 mg total) by mouth at bedtime.   PANTOPRAZOLE (PROTONIX) 40 MG TABLET    TAKE 1 TABLET BY MOUTH TWICE DAILY   RITONAVIR (NORVIR) 100 MG TABS TABLET    TAKE 1 TABLET BY MOUTH AT BEDTIME. TAKE WITH PREZISTA TABLET   TENOFOVIR (VIREAD) 300 MG TABLET    TAKE 1 TABLET BY MOUTH ONCE A  WEEK ON SUNDAYS AT BEDTIME   VALACYCLOVIR (VALTREX) 500 MG TABLET    Take 1 tablet (500 mg total) by mouth daily as needed (herpes outbreaks).  Modified Medications   No medications on file  Discontinued Medications   No medications on file    Subjective: [name] is in for his routine HIV follow-up visit. He recalls missing only one dose of his Viread, Tivicay, Prezista and Norvir. That occurred when he was at [facility] undergoing vascular surgery on his left arm graft.   Review of Systems: Review of Systems  Constitutional: Negative for chills, diaphoresis, fever, malaise/fatigue and weight loss.  HENT: Negative for sore throat.   Respiratory: Negative for cough, sputum production and shortness of breath.   Cardiovascular: Negative for chest pain.  Gastrointestinal: Negative for abdominal pain, diarrhea, heartburn, nausea and vomiting.  Musculoskeletal: Negative for joint pain and myalgias.  Skin: Negative for rash.  Neurological: Negative for dizziness and headaches.  Psychiatric/Behavioral: Negative for depression and substance abuse. The patient is not nervous/anxious.     Past Medical History:  Diagnosis Date  . Anal condyloma   . Anemia   . Diarrhea    occurs because of dialysis  . DJD (degenerative joint disease)    right knee.  All joints  .  ESRD (end stage renal disease) on dialysis ([place]) 01/02/2012   Gets diaysis at [facility] on [address], TTS schedule.  Started dialysis in 2000.     . Family history of disseminated HSV infection   . Genital herpes   . Hemodialysis patient [facility])    Tuesday, Thursday, Saturday  . History of blood transfusion   . HIV infection ([place])   . Hypertension   . Pneumonia 1/20107   hx of  . Shortness of breath    "can happen at anytime" (03/04/2013).  03/14/16- "when I have too much fluid"  . Tertiary syphilis   . Thrombosis of dialysis vascular access ([place]) 01/02/2012   RUA AV fistula clotted on 02/27/13, declotted by IR. Reclotted on 03/02/13 and  IR placed L IJ tunneled HD catheter (a right external jugular tunneled HD cath was attempted first but did not function due to kinking across the clavicle). Pt was seen by [facility] on same admission 6/614 and said RUA AVF no longer usable, they ordered vein mapping and will see him as outpatient to set up next permanent access.       Social History  Substance Use Topics  . Smoking status: Heavy Tobacco Smoker    Packs/day: 0.50    Years: 27.00    Types: Cigarettes  . Smokeless tobacco: Never Used     Comment: cutting back from last visit  . Alcohol use No     Comment: 03/04/2013 "stopped drinking ~ 12 yr ago; never had problem w/it".      Family History  Problem Relation Age of Onset  . Diabetes Mother   . Arthritis Father        Gout- Foot    Allergies  Allergen Reactions  . Penicillins Hives and Other (See Comments)    Rx required Hospitalization Has patient had a PCN reaction causing immediate rash, facial/tongue/throat swelling, SOB or lightheadedness with hypotension: Yes Has patient had a PCN reaction causing severe rash involving mucus membranes or skin necrosis: No Has patient had a PCN reaction that required hospitalization Yes Has patient had a PCN reaction occurring within the last 10 years: No   . Contrast Media [Iodinated Diagnostic Agents] Hives and Itching  . Acetaminophen-Codeine Nausea And Vomiting  . Oxycodone-Acetaminophen     Reaction unknown  . Tape Rash    Use paper tape only (NO CLEAR PLASTIC TAPE!!)    Objective:  Vitals:   03/12/17 1042  BP: (!) 159/92  Pulse: 67  Temp: 97.6 F (36.4 C)  TempSrc: Oral  Weight: 163 lb (73.9 kg)  Height: 5' 11"  (1.803 m)   Body mass index is 22.73 kg/m.  Physical Exam  Constitutional: He is oriented to person, place, and time.  HENT:  Mouth/Throat: No oropharyngeal exudate.  Eyes: Conjunctivae are normal.  Cardiovascular: Normal rate and regular rhythm.   No murmur heard. Pulmonary/Chest: Effort normal and  breath sounds normal.  Abdominal: Soft. He exhibits no mass. There is no tenderness.  Musculoskeletal: Normal range of motion.  Neurological: He is alert and oriented to person, place, and time.  Skin: No rash noted.  Psychiatric: Mood and affect normal.    Lab Results Lab Results  Component Value Date   WBC 5.3 02/05/2017   HGB 10.3 (L) 02/05/2017   HCT 32.6 (L) 02/05/2017   MCV 92.1 02/05/2017   PLT 97 (L) 02/05/2017    Lab Results  Component Value Date   CREATININE 12.54 (H) 02/05/2017   BUN 22 02/05/2017  NA 141 02/05/2017   K 4.1 02/05/2017   CL 102 02/05/2017   CO2 24 02/05/2017    Lab Results  Component Value Date   ALT 8 (L) 02/05/2017   AST 10 02/05/2017   ALKPHOS 215 (H) 02/05/2017   BILITOT 0.4 02/05/2017    Lab Results  Component Value Date   CHOL 190 02/05/2017   HDL 48 02/05/2017   LDLCALC 117 (H) 02/05/2017   TRIG 125 02/05/2017   CHOLHDL 4.0 02/05/2017   HIV 1 RNA Quant (copies/mL)  Date Value  02/05/2017 <20 DETECTED (A)  12/08/2016 250  08/19/2016 <20   CD4 T Cell Abs (/uL)  Date Value  02/05/2017 810  12/08/2016 990  08/19/2016 960     Problem List Items Addressed This Visit      Medium   HIV disease ([place])    His infection remains under excellent, long-term control. He will continue his current regimen and follow-up after lab work in 6 months.      Relevant Orders   T-helper cell (CD4)- ([facility] clinic only)   HIV 1 RNA quant-no reflex-bld        Michel Bickers, MD [facility] [place] [phone] pager   [phone] cell 03/12/2017, 11:14 AM

## 2017-03-13 ENCOUNTER — Encounter: Payer: Medicare Other | Admitting: Vascular Surgery

## 2017-03-13 ENCOUNTER — Other Ambulatory Visit: Payer: Self-pay | Admitting: Vascular Surgery

## 2017-03-13 ENCOUNTER — Ambulatory Visit (INDEPENDENT_AMBULATORY_CARE_PROVIDER_SITE_OTHER): Payer: Medicare Other | Admitting: *Deleted

## 2017-03-13 DIAGNOSIS — Z23 Encounter for immunization: Secondary | ICD-10-CM

## 2017-03-13 DIAGNOSIS — Z992 Dependence on renal dialysis: Principal | ICD-10-CM

## 2017-03-13 DIAGNOSIS — N186 End stage renal disease: Secondary | ICD-10-CM

## 2017-03-19 ENCOUNTER — Encounter: Payer: Self-pay | Admitting: Vascular Surgery

## 2017-03-23 NOTE — Progress Notes (Signed)
This encounter was created in error - please disregard.

## 2017-03-30 ENCOUNTER — Encounter: Payer: Self-pay | Admitting: Vascular Surgery

## 2017-04-03 ENCOUNTER — Other Ambulatory Visit: Payer: Self-pay | Admitting: Vascular Surgery

## 2017-04-03 ENCOUNTER — Ambulatory Visit (HOSPITAL_COMMUNITY)
Admission: RE | Admit: 2017-04-03 | Discharge: 2017-04-03 | Disposition: A | Discharge status: Home / Self Care | Payer: Medicare Other | Source: Ambulatory Visit | Attending: Vascular Surgery | Admitting: Vascular Surgery

## 2017-04-03 ENCOUNTER — Other Ambulatory Visit: Payer: Self-pay

## 2017-04-03 ENCOUNTER — Ambulatory Visit (INDEPENDENT_AMBULATORY_CARE_PROVIDER_SITE_OTHER): Payer: Medicare Other | Admitting: Vascular Surgery

## 2017-04-03 ENCOUNTER — Other Ambulatory Visit (HOSPITAL_COMMUNITY): Payer: Medicare Other

## 2017-04-03 ENCOUNTER — Encounter: Payer: Self-pay | Admitting: Vascular Surgery

## 2017-04-03 VITALS — BP 139/85 | HR 70 | Temp 98.2°F | Resp 16 | Ht 71.0 in | Wt 159.0 lb

## 2017-04-03 DIAGNOSIS — N186 End stage renal disease: Secondary | ICD-10-CM

## 2017-04-03 DIAGNOSIS — Z992 Dependence on renal dialysis: Secondary | ICD-10-CM | POA: Insufficient documentation

## 2017-04-03 NOTE — Progress Notes (Signed)
Patient ID: [name], male   DOB: 1965-10-14, 51 y.o.   MRN: [id number]  Reason for Consult: Re-evaluation (Eval pain in L arm.  Eval for possible new sites for access.  Dr. Arrie Aran [phone].)   Referred by Almon Hercules, MD  Subjective:     HPI:  [name] is a 51 y.o. male with a history of a left upper arm graft performed here 1 year ago. 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 He now has had an episode of to injure cannulation of his graft has causes him significant pain. He is here for evaluation of his left upper arm graft today consideration of intervention. He has pain with any palpation or cannulation of the access site. He is already had a graft that is filled in his right upper arm. Dialysis graft duplex performed prior to evaluation today. He continues to dialyze through the graft on Tuesdays Thursdays and Saturdays.  Past Medical History:  Diagnosis Date  . Anal condyloma   . Anemia   . Diarrhea    occurs because of dialysis  . DJD (degenerative joint disease)    right knee.  All joints  . ESRD (end stage renal disease) on dialysis (HCC) 01/02/2012   Gets diaysis at [facility] on [employer], TTS schedule.  Started dialysis in 2000.     . Family history of disseminated HSV infection   . Genital herpes   . Hemodialysis patient [facility])    Tuesday, Thursday, Saturday  . History of blood transfusion   . HIV infection (HCC)   . Hypertension   . Pneumonia 1/20107   hx of  . Shortness of breath    "can happen at anytime" (03/04/2013).  03/14/16- "when I have too much fluid"  . Tertiary syphilis   . 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 Reclotted on 03/02/13 and IR placed L IJ tunneled HD catheter (a right external jugular tunneled HD cath was attempted first but did not function due to kinking across the clavicle). Pt  was seen by [facility] on same admission 6/614 and said RUA AVF no longer usable, they ordered vein mapping and will see him as outpatient to set up next permanent access.      Family History  Problem Relation Age of Onset  . Diabetes Mother   . Arthritis Father        Gout- Foot   Past Surgical History:  Procedure Laterality Date  . APPENDECTOMY    . AV FISTULA PLACEMENT Right 06/16/11  . AV FISTULA PLACEMENT Left 03/17/2016   Procedure: INSERTION OF ARTERIOVENOUS (AV) GORE-TEX GRAFT ARM USING GORETEX 4-7MM X 45CM STRETCH GRAFT;  Surgeon: Sherren Kerns, MD;  Location: [facility] OR;  Service: Vascular;  Laterality: Left;  . AV FISTULA PLACEMENT, BRACHIOCEPHALIC Right 06/16/11  . ESOPHAGOGASTRODUODENOSCOPY N/A 12/09/2016   Procedure: ESOPHAGOGASTRODUODENOSCOPY (EGD);  Surgeon: Kathi Der, MD;  Location: [facility] ENDOSCOPY;  Service: Gastroenterology;  Laterality: N/A;  . ESOPHAGOGASTRODUODENOSCOPY N/A 01/05/2017   Procedure: ESOPHAGOGASTRODUODENOSCOPY (EGD);  Surgeon: Vida Rigger, MD;  Location: [facility] ENDOSCOPY;  Service: Endoscopy;  Laterality: N/A;  . ESOPHAGOGASTRODUODENOSCOPY (EGD) WITH PROPOFOL N/A 01/06/2017   Procedure: ESOPHAGOGASTRODUODENOSCOPY (EGD) WITH PROPOFOL;  Surgeon: Kathi Der, MD;  Location: [facility] ENDOSCOPY;  Service: Gastroenterology;  Laterality: N/A;  . INCISION AND DRAINAGE ABSCESS     abdominal  . PERIPHERAL VASCULAR  CATHETERIZATION N/A 03/07/2016   Procedure: Susie CassetteVenogram;  Surgeon: Sherren Kernsharles E Fields, MD;  [facility];  Service: Cardiovascular;  Laterality: N/A;  . REVISON OF ARTERIOVENOUS FISTULA Right 04/20/2013   Procedure: INSERTION OF ARTERIOVENOUS GORTEX GRAFT;  Surgeon: Sherren Kernsharles E Fields, MD;  [facility];  Service: Vascular;  Laterality: Right;  Ultrasound Guided  . TONSILLECTOMY      Short Social History:  Social History  Substance Use Topics  . Smoking status: Heavy Tobacco Smoker    Packs/day: 0.50    Years: 27.00    Types: Cigarettes  . Smokeless  tobacco: Never Used  . Alcohol use No     Comment: 03/04/2013 "stopped drinking ~ 12 yr ago; never had problem w/it".      Allergies  Allergen Reactions  . Penicillins Hives and Other (See Comments)    Rx required Hospitalization Has patient had a PCN reaction causing immediate rash, facial/tongue/throat swelling, SOB or lightheadedness with hypotension: Yes Has patient had a PCN reaction causing severe rash involving mucus membranes or skin necrosis: No Has patient had a PCN reaction that required hospitalization Yes Has patient had a PCN reaction occurring within the last 10 years: No   . Contrast Media [Iodinated Diagnostic Agents] Hives and Itching  . Acetaminophen-Codeine Nausea And Vomiting  . Oxycodone-Acetaminophen     Reaction unknown  . Tape Rash    Use paper tape only (NO CLEAR PLASTIC TAPE!!)    Current Outpatient Prescriptions  Medication Sig Dispense Refill  . acetaminophen (TYLENOL) 500 MG tablet Take 1,000 mg by mouth every 6 (six) hours as needed (pain or headaches).     Marland Kitchen. albuterol (PROVENTIL HFA;VENTOLIN HFA) 108 (90 Base) MCG/ACT inhaler Inhale 2 puffs into the lungs every 6 (six) hours as needed for wheezing or shortness of breath.    . calcium acetate (PHOSLO) 667 MG capsule Take 2,668 mg by mouth See admin instructions. Two to three times a day with meals and 2,668 mg (also) with each snack    . darunavir (PREZISTA) 800 MG tablet TAKE 1 TABLET BY MOUTH AT BEDTIME WITH NORVIR 90 tablet 1  . docusate sodium (COLACE) 100 MG capsule Take 1 capsule (100 mg total) by mouth daily. 10 capsule 0  . dolutegravir (TIVICAY) 50 MG tablet Take 1 tablet (50 mg total) by mouth at bedtime. 90 tablet 1  . pantoprazole (PROTONIX) 40 MG tablet TAKE 1 TABLET BY MOUTH TWICE DAILY 180 tablet 0  . ritonavir (NORVIR) 100 MG TABS tablet TAKE 1 TABLET BY MOUTH AT BEDTIME. TAKE WITH PREZISTA TABLET 90 tablet 1  . tenofovir (VIREAD) 300 MG tablet TAKE 1 TABLET BY MOUTH ONCE A WEEK ON SUNDAYS  AT BEDTIME 12 tablet 1  . valACYclovir (VALTREX) 500 MG tablet Take 1 tablet (500 mg total) by mouth daily as needed (herpes outbreaks). 30 tablet 11   No current facility-administered medications for this visit.     Review of Systems  Constitutional:  Constitutional negative. HENT: HENT negative.  Eyes: Eyes negative.  Respiratory: Positive for shortness of breath.  Cardiovascular: Positive for dyspnea with exertion.  GI: Positive for blood in stool.  Skin: Positive for rash.  Neurological: Neurological negative. Hematologic: Hematologic/lymphatic negative.  Psychiatric: Psychiatric negative.        Objective:  Objective   Vitals:   04/03/17 1042  BP: 139/85  Pulse: 70  Resp: 16  Temp: 98.2 F (36.8 C)  TempSrc: Oral  SpO2: 100%  Weight: 159 lb (72.1 kg)  Height: 5\' 11"  (1.803 m)   Body mass index is 22.18 kg/m.  Physical Exam  Constitutional: He appears well-developed.  HENT:  Head: Normocephalic.  Neck: Normal range of motion.  Cardiovascular: Normal rate.   Pulses:      Radial pulses are 2+ on the left side.  Pulsatility in left upper arm av graft  Abdominal: Soft.  Musculoskeletal: Normal range of motion. He exhibits edema.  Neurological: He is alert.  Skin: Skin is warm and dry.  Psychiatric: He has a normal mood and affect. His behavior is normal. Judgment and thought content normal.    Data: I've interpreted his dialysis graft duplex today to have an outflow anastomotic velocity of 753 with ratio of 7-1.     Assessment/Plan:     51 year old male with history of multiple upper extremity dialysis access sees now with significant pain in his left arm as well as swelling. His dialysis duplex exam today demonstrates likely outflow stenosis. We will plan fistulogram with likely intervention of the left arm possible in-stent stenosis that may require restenting. He unfortunately has no further upper extremity options for dialysis and would need lower  extremity access if this fails. We discussed the risk and benefits of procedure and he agrees to proceed in the near future on a nondialysis day.     Maeola Harman MD [facility]

## 2017-04-15 ENCOUNTER — Ambulatory Visit (HOSPITAL_COMMUNITY)
Admission: RE | Admit: 2017-04-15 | Discharge: 2017-04-15 | Disposition: A | Discharge status: Home / Self Care | Payer: Medicare Other | Source: Ambulatory Visit | Attending: Vascular Surgery | Admitting: Vascular Surgery

## 2017-04-15 ENCOUNTER — Encounter (HOSPITAL_COMMUNITY)
Admission: RE | Disposition: A | Discharge status: Home / Self Care | Payer: Self-pay | Source: Ambulatory Visit | Attending: Vascular Surgery

## 2017-04-15 DIAGNOSIS — Z880 Allergy status to penicillin: Secondary | ICD-10-CM | POA: Insufficient documentation

## 2017-04-15 DIAGNOSIS — M1990 Unspecified osteoarthritis, unspecified site: Secondary | ICD-10-CM | POA: Diagnosis not present

## 2017-04-15 DIAGNOSIS — Z86718 Personal history of other venous thrombosis and embolism: Secondary | ICD-10-CM | POA: Insufficient documentation

## 2017-04-15 DIAGNOSIS — Z91041 Radiographic dye allergy status: Secondary | ICD-10-CM | POA: Insufficient documentation

## 2017-04-15 DIAGNOSIS — N186 End stage renal disease: Secondary | ICD-10-CM

## 2017-04-15 DIAGNOSIS — B20 Human immunodeficiency virus [HIV] disease: Secondary | ICD-10-CM | POA: Insufficient documentation

## 2017-04-15 DIAGNOSIS — Y832 Surgical operation with anastomosis, bypass or graft as the cause of abnormal reaction of the patient, or of later complication, without mention of misadventure at the time of the procedure: Secondary | ICD-10-CM | POA: Diagnosis not present

## 2017-04-15 DIAGNOSIS — Z992 Dependence on renal dialysis: Secondary | ICD-10-CM | POA: Insufficient documentation

## 2017-04-15 DIAGNOSIS — T82858A Stenosis of vascular prosthetic devices, implants and grafts, initial encounter: Secondary | ICD-10-CM | POA: Insufficient documentation

## 2017-04-15 DIAGNOSIS — T82898A Other specified complication of vascular prosthetic devices, implants and grafts, initial encounter: Secondary | ICD-10-CM | POA: Diagnosis not present

## 2017-04-15 DIAGNOSIS — D631 Anemia in chronic kidney disease: Secondary | ICD-10-CM | POA: Insufficient documentation

## 2017-04-15 DIAGNOSIS — F17210 Nicotine dependence, cigarettes, uncomplicated: Secondary | ICD-10-CM | POA: Insufficient documentation

## 2017-04-15 DIAGNOSIS — I120 Hypertensive chronic kidney disease with stage 5 chronic kidney disease or end stage renal disease: Secondary | ICD-10-CM | POA: Insufficient documentation

## 2017-04-15 HISTORY — PX: PERIPHERAL VASCULAR INTERVENTION: CATH118257

## 2017-04-15 HISTORY — PX: A/V SHUNTOGRAM: CATH118297

## 2017-04-15 LAB — POCT I-STAT, CHEM 8
BUN: 14 mg/dL (ref 6–20)
CALCIUM ION: 1.06 mmol/L — AB (ref 1.15–1.40)
CHLORIDE: 102 mmol/L (ref 101–111)
Creatinine, Ser: 9.9 mg/dL — ABNORMAL HIGH (ref 0.61–1.24)
Glucose, Bld: 76 mg/dL (ref 65–99)
HEMATOCRIT: 40 % (ref 39.0–52.0)
Hemoglobin: 13.6 g/dL (ref 13.0–17.0)
POTASSIUM: 4.2 mmol/L (ref 3.5–5.1)
SODIUM: 143 mmol/L (ref 135–145)
TCO2: 28 mmol/L (ref 0–100)

## 2017-04-15 SURGERY — A/V SHUNTOGRAM
Anesthesia: LOCAL | Laterality: Left

## 2017-04-15 MED ORDER — LIDOCAINE HCL (PF) 1 % IJ SOLN
INTRAMUSCULAR | Status: DC | PRN
  Administered 2017-04-15: 10 mL

## 2017-04-15 MED ORDER — FAMOTIDINE IN NACL 20-0.9 MG/50ML-% IV SOLN
20.0000 mg | INTRAVENOUS | Status: AC
  Administered 2017-04-15: 20 mg via INTRAVENOUS

## 2017-04-15 MED ORDER — METHYLPREDNISOLONE SODIUM SUCC 125 MG IJ SOLR
125.0000 mg | INTRAMUSCULAR | Status: AC
  Administered 2017-04-15: 62.5 mg via INTRAVENOUS

## 2017-04-15 MED ORDER — IODIXANOL 320 MG/ML IV SOLN
INTRAVENOUS | Status: DC | PRN
  Administered 2017-04-15: 75 mL

## 2017-04-15 MED ORDER — DIPHENHYDRAMINE HCL 50 MG/ML IJ SOLN
25.0000 mg | INTRAMUSCULAR | Status: AC
  Administered 2017-04-15: 25 mg via INTRAVENOUS

## 2017-04-15 MED ORDER — LIDOCAINE HCL (PF) 1 % IJ SOLN
INTRAMUSCULAR | Status: AC
  Filled 2017-04-15: qty 30

## 2017-04-15 MED ORDER — METHYLPREDNISOLONE SODIUM SUCC 125 MG IJ SOLR
INTRAMUSCULAR | Status: AC
  Administered 2017-04-15: 62.5 mg via INTRAVENOUS
  Filled 2017-04-15: qty 2

## 2017-04-15 MED ORDER — HEPARIN (PORCINE) IN NACL 2-0.9 UNIT/ML-% IJ SOLN
INTRAMUSCULAR | Status: AC | PRN
  Administered 2017-04-15: 1000 mL

## 2017-04-15 MED ORDER — FAMOTIDINE IN NACL 20-0.9 MG/50ML-% IV SOLN
INTRAVENOUS | Status: AC
  Administered 2017-04-15: 20 mg via INTRAVENOUS
  Filled 2017-04-15: qty 50

## 2017-04-15 MED ORDER — HEPARIN SODIUM (PORCINE) 1000 UNIT/ML IJ SOLN
INTRAMUSCULAR | Status: DC | PRN
  Administered 2017-04-15: 3000 [IU] via INTRAVENOUS

## 2017-04-15 MED ORDER — SODIUM CHLORIDE 0.9% FLUSH: 3.0000 mL | INTRAVENOUS | Status: DC | PRN

## 2017-04-15 MED ORDER — HEPARIN SODIUM (PORCINE) 1000 UNIT/ML IJ SOLN
INTRAMUSCULAR | Status: AC
  Filled 2017-04-15: qty 1

## 2017-04-15 MED ORDER — DIPHENHYDRAMINE HCL 50 MG/ML IJ SOLN
INTRAMUSCULAR | Status: AC
  Administered 2017-04-15: 25 mg via INTRAVENOUS
  Filled 2017-04-15: qty 1

## 2017-04-15 SURGICAL SUPPLY — 20 items
BAG SNAP BAND KOVER 36X36 (MISCELLANEOUS) ×2 IMPLANT
BALLN STERLING OTW 5X20X135 (BALLOONS) ×2
BALLN STERLING OTW 8X60X135 (BALLOONS) ×2
BALLOON STERLING OTW 5X20X135 (BALLOONS) IMPLANT
BALLOON STERLING OTW 8X60X135 (BALLOONS) IMPLANT
COVER DOME SNAP 22 D (MISCELLANEOUS) ×2 IMPLANT
COVER PRB 48X5XTLSCP FOLD TPE (BAG) ×1 IMPLANT
COVER PROBE 5X48 (BAG) ×2
KIT ENCORE 26 ADVANTAGE (KITS) ×1 IMPLANT
KIT MICROINTRODUCER STIFF 5F (SHEATH) ×1 IMPLANT
PROTECTION STATION PRESSURIZED (MISCELLANEOUS) ×2
SHEATH PINNACLE R/O II 7F 4CM (SHEATH) ×1 IMPLANT
STATION PROTECTION PRESSURIZED (MISCELLANEOUS) ×1 IMPLANT
STENT VIABAHN 7X50X120 (Permanent Stent) ×2 IMPLANT
STENT VIABAHN 7X5X120 7FR (Permanent Stent) IMPLANT
STOPCOCK MORSE 400PSI 3WAY (MISCELLANEOUS) ×4 IMPLANT
TRAY PV CATH (CUSTOM PROCEDURE TRAY) ×2 IMPLANT
TUBING CIL FLEX 10 FLL-RA (TUBING) ×2 IMPLANT
WIRE BENTSON .035X145CM (WIRE) ×2 IMPLANT
WIRE G V18X300CM (WIRE) ×2 IMPLANT

## 2017-04-15 NOTE — Op Note (Signed)
    Patient name: [name] MRN: [id number] DOB: [birth year] Sex: male  04/15/2017 Pre-operative Diagnosis: malfunction of left arm av graft Post-operative diagnosis:  Same Surgeon:  Luanna SalkBrandon C. Randie Heinzain, MD Procedure Performed: 1.  Left arm shuntogram 2.  Stent of left arm av graft venous anastomosis with 7 x 50mm viabahn  Indications:  51 year old male with [name] has had pain with cannulation. He has a known stent of his left [facility]clavian axillary [facility] which was reviewed by Dr. Edilia Boickson thought to be satisfactory. There is pulsatility in the graft and he was indicated for shuntogram with possible intervention.  Findings: There is a greater than 80% stenosis at the venous anastomosis. Following stenting there is 0% residual stenosis with brisk runoff centrally. Previous subclavian stent is patent.   Procedure:  The patient was identified in the holding area and taken to room 8.  The patient was then placed supine on the table and prepped and draped in the usual sterile fashion.  A time out was called.  The left arm was anesthetized with 1% lidocaine. We then cannulated with micropuncture needle placed a wire and sheath and performed left arm shuntogram with central venography. With the above findings and 80% stenosis of the AV anastomosis we then placed a 7 French sheath and a V 18 wire across the anastomosis. This was predilated with 5 mm balloon and a retrograde injection was performed demonstrated patent arterial anastomosis with no disease throughout the graft. We then brought a 5 mm Biobond to the table and deployed this across the anastomosis and postdilated with 8 mm balloon. Completion venogram demonstrated brisk flow with 0% residual stenosis. We also inflated the balloon to size in the previously placed subclavian stent there was no significant stenosis there. Satisfied wire was removed figure-of-eight stitch was placed and sheath removed and pressure  held until hemostasis obtained. Patient tolerated procedure well without immediate complication.  Contrast: 75cc    Kamiah Fite C. Randie Heinzain, MD [facility] [place] Office: [phone] Pager: [phone]

## 2017-04-15 NOTE — Discharge Instructions (Signed)

## 2017-04-15 NOTE — H&P (Signed)
   History and Physical Update  The patient was interviewed and re-examined.  The patient's previous History and Physical has been reviewed. He had partial dialysis yesterday but could not complete. Will perform upper extremity fistulogram of left with likely intervention. Possibly he will need a catheter if cannot get open.   Leilah Polimeni C. Randie Heinzain, MD [facility] [place] Office: [phone] Pager: [phone]   04/15/2017, 10:13 AM

## 2017-04-16 ENCOUNTER — Encounter (HOSPITAL_COMMUNITY): Payer: Self-pay | Admitting: Vascular Surgery

## 2017-06-12 ENCOUNTER — Encounter: Payer: Medicare Other | Admitting: Student

## 2017-06-15 ENCOUNTER — Encounter: Payer: Medicare Other | Admitting: Student

## 2017-06-18 ENCOUNTER — Encounter: Payer: Self-pay | Admitting: Student

## 2017-06-18 ENCOUNTER — Ambulatory Visit (INDEPENDENT_AMBULATORY_CARE_PROVIDER_SITE_OTHER): Payer: Medicare Other | Admitting: Student

## 2017-06-18 VITALS — BP 128/64 | HR 74 | Temp 98.2°F | Ht 71.0 in | Wt 152.0 lb

## 2017-06-18 DIAGNOSIS — Z0000 Encounter for general adult medical examination without abnormal findings: Secondary | ICD-10-CM | POA: Diagnosis not present

## 2017-06-18 DIAGNOSIS — M79672 Pain in left foot: Secondary | ICD-10-CM

## 2017-06-18 NOTE — Progress Notes (Signed)
Subjective:   Chief Complaint  Patient presents with  . Annual Exam   HPI Melissa is a 51 y.o. old male here  for annual exam.  Concern today: heel pain Changes in his/her health in the last 12 months: no Occupation: does two jobs now Wears seatbelt: no.    The patient has regular exercise: no.   Enough vegetables and fruits: yes.  Smokes cigarette: yes. Half a pack a day for 28 years. Not interested in quitting.  Drinks EtOH: no Drug use: no Patient takes ASA: no.  Patient takes vitD & Ca: yes. ESRD patient  The patient is sexually active.   Domestic violence: no.  Advance directive: no. MOST: no.   History of depression:denies depression but admits stress with step-son hanging with wrong crowd.  Patient dental home: yes.   Immunizations  Needs influenza vaccine: yes.  Needs HPV (Women until age 74): not applicable.  Needs Shingrix (all >29yr of age): no. Likes to discuss  Needs Tdap: no.  Needs Pneumococcal: had one already  Screening Need colon cancer screening: no. Need lung cancer screening:no. At risk for skin cancer: no. Need STI Screening: no.  PMH/Problem List: has BACTEREMIA, MYCOBACTERIUM AVIUM COMPLEX; HIV disease ([place]; HERPES, GENITAL NEC; WARTS, OTHER SPECIFIED VIRAL; SYPHILIS NOS; CIGARETTE SMOKER; Depression; PERIPHERAL NEUROPATHY; Thrombosis of dialysis vascular access; ESRD (end stage renal disease) on dialysis; Normocytic anemia; Thrombocytopenia ([place]; Gall stone; Gastroesophageal reflux disease; Routine adult health maintenance; GI bleed; Toe ulcer ([place]; Anemia; PUD (peptic ulcer disease); and Nonrheumatic aortic valve stenosis on his problem list.   has a past medical history of Anal condyloma; Anemia; Diarrhea; DJD (degenerative joint disease); [place] ([place] Family history of disseminated HSV infection; Genital herpes; Hemodialysis patient ([facility]; History of blood transfusion; HIV infection  ([place]; Hypertension; Pneumonia (1/20107); Shortness of breath; Tertiary syphilis; and Th[place]osis of [place] vascular [place] ([place] ([place]02[place]).  [facility]  Problem Relation Age of Onset  . Diabetes Mother   . Arthritis Father        Gout- Foot   Family history of heart disease before age of 673yrs: no. Family history of stroke: no. Family history of cancer: no.  [place]  Substance Use Topics  . Smoking status: Heavy Tobacco Smoker    Packs/day: 0.50    Years: 27.00    Types: Cigarettes  . Smokeless tobacco: Never Used  . Alcohol use No     Comment: 03/04/2013 "stopped drinking ~ 12 yr ago; never had problem w/it".       Review of Systems  Constitutional: Negative for diaphoresis, fatigue, fever and unexpected weight change.  HENT: Negative for congestion, hearing loss, trouble swallowing and voice change.   Eyes: Negative for visual disturbance.  Respiratory: Negative for cough, chest tightness and shortness of breath.   Cardiovascular: Positive for leg swelling. Negative for chest pain and palpitations.  Gastrointestinal: Negative for abdominal pain, blood in stool and diarrhea.  Endocrine: Negative for cold intolerance, heat intolerance, polydipsia, polyphagia and polyuria.  Genitourinary: Positive for decreased urine volume. Negative for scrotal swelling.       ESRD  Musculoskeletal: Positive for arthralgias. Negative for myalgias.  Skin: Negative for rash.  Neurological: Negative for dizziness, light-headedness and headaches.  Hematological: Negative for adenopathy. Does not bruise/bleed easily.  Psychiatric/Behavioral: Negative for dysphoric mood. The patient is not nervous/anxious.        Objective:   Physical Exam Vitals:   06/18/17 1537  BP: 128/64  Pulse: 74  Temp: 98.2 F (36.8 C)  TempSrc: Oral  SpO2: 99%  Weight: 152 lb (68.9 kg)  Height: 5' 11"  (1.803 m)   Body mass index is 21.2 kg/m.  GEN: appears well, no apparent  distress. Head: normocephalic and atraumatic  Eyes: conjunctiva without injection, sclera anicteric, pterygium bilaterally Ears: external ear and ear canal normal Nares: no rhinorrhea, congestion or erythema Oropharynx: mmm without erythema or exudation. No dentition. HEM: negative for cervical or periauricular lymphadenopathies CVS: RRR, nl s1 & s2, no murmurs, trace to 1+ edema RESP: no IWOB, good air movement bilaterally, CTAB GI: BS present & normal, soft, NTND GU: no suprapubic or CVA tenderness MSK: no focal tenderness SKIN: no apparent skin lesion ENDO: negative thyromegally NEURO: alert and oiented appropriately, no gross deficits  PSYCH: euthymic mood with congruent affect.    Assessment & Plan:  1. Annual physical exam: no major changes. He is compliant with his HIV medicines. He is followed by ID. His last viral load was negative. His CD4 count was >800.  He says he received his flu and Tdap at dialysis center. We were not able to see this in [facility]. I also recommended Shigirix but he likes to discuss this ID first.  I emphasized about daily exercise. Recommended regular opthalmology and dental visit.  He still smokes but not ready to quit. Gave him quit line card.  Patient gets labs at dialysis center. So no blood work ordered. History of depression listed in his problem list but he denies and didn't fill PHQ-9. He says he stressed about dealing with his step son who hangs with wrong group.  2. Pain of left heel: likely Achille tendinoplasty. No tenderness over bony parts. Recommended icing and eccentric exercise. Return to clinic if no improvement.   Wendee Beavers PGY-3 Pager [phone] 06/20/17  10:13 PM

## 2017-06-18 NOTE — Patient Instructions (Signed)
It was great seeing you today! We have addressed the following issues today 1. General check up: we recommend daily exercise up to 150 mins a week.  2. Heel pain: continue stretching using the rubber band as we discussed. I also recommend icing for 10-15 minutes at the end of the day. If no improvement with the above measures, please come back and see [place]. 3. Please follow up with the infectious disease doctors.   4. I also recommend you see your eye doctor you dentist regularly  If we did any lab work today, and the results require attention, either me or my nurse will get in touch with you. If everything is normal, you will get a letter in mail and a message via . If you don't hear from [place] in two weeks, please give [place] a call. Otherwise, we look forward to seeing you again at your next visit. If you have any questions or concerns before then, please call the clinic at [phone].  Please bring all your medications to every doctors visit  Sign up for My Chart to have easy access to your labs results, and communication with your Primary care physician.    Please check-out at the front desk before leaving the clinic.    Take Care,   Dr. Alanda Slim

## 2017-08-17 ENCOUNTER — Encounter: Payer: Self-pay | Admitting: Physician Assistant

## 2017-08-17 ENCOUNTER — Other Ambulatory Visit: Payer: Self-pay

## 2017-08-17 NOTE — Patient Instructions (Signed)
     IF you received an x-ray today, you will receive an invoice from [facility]. Please contact [facility] at [phone] with questions or concerns regarding your invoice.   IF you received labwork today, you will receive an invoice from [facility]. Please contact [facility] at [phone] with questions or concerns regarding your invoice.   Our billing staff will not be able to assist you with questions regarding bills from these companies.  You will be contacted with the lab results as soon as they are available. The fastest way to get your results is to activate your My Chart account. Instructions are located on the last page of this paperwork. If you have not heard from us regarding the results in 2 weeks, please contact this office.     

## 2017-08-18 ENCOUNTER — Telehealth: Payer: Self-pay | Admitting: Family Medicine

## 2017-08-18 ENCOUNTER — Ambulatory Visit: Payer: Medicare Other

## 2017-08-18 NOTE — Progress Notes (Signed)
Patient not seen during visit  He will return

## 2017-08-18 NOTE — Telephone Encounter (Signed)
Copied from CRM [phone]. 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

## 2017-08-24 ENCOUNTER — Telehealth: Payer: Self-pay

## 2017-08-24 ENCOUNTER — Ambulatory Visit: Payer: Medicare Other | Admitting: Physician Assistant

## 2017-08-24 NOTE — Telephone Encounter (Signed)
Pt called and advised per PA English that we are unable to pass him on his DOT physical at this time.

## 2017-09-15 ENCOUNTER — Ambulatory Visit (INDEPENDENT_AMBULATORY_CARE_PROVIDER_SITE_OTHER): Payer: Medicare Other | Admitting: Internal Medicine

## 2017-09-15 DIAGNOSIS — B20 Human immunodeficiency virus [HIV] disease: Secondary | ICD-10-CM | POA: Diagnosis present

## 2017-09-15 NOTE — Progress Notes (Addendum)
Patient Active Problem List   Diagnosis Date Noted  . ESRD (end stage renal disease) on dialysis 01/02/2012    Priority: Medium  . HIV disease ([place]) 10/19/2006    Priority: Medium  . CIGARETTE SMOKER 10/19/2006    Priority: Medium  . Depression 10/19/2006    Priority: Medium  . Nonrheumatic aortic valve stenosis   . PUD (peptic ulcer disease)   . Anemia 01/01/2017  . Toe ulcer ([place])   . GI bleed 12/07/2016  . Gastroesophageal reflux disease 09/13/2016  . Annual physical exam 09/13/2016  . Normocytic anemia 10/14/2015  . Thrombocytopenia ([place]) 10/14/2015  . Gall stone   . 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 PERIPHERAL NEUROPATHY 04/10/2009  . BACTEREMIA, MYCOBACTERIUM AVIUM COMPLEX 10/19/2006  . HERPES, GENITAL NEC 10/19/2006  . WARTS, OTHER SPECIFIED VIRAL 10/19/2006  . SYPHILIS NOS 10/19/2006      Medication List        Accurate as of 09/15/17  9:07 AM. Always use your most recent med list.          acetaminophen 500 MG tablet Commonly known as:  TYLENOL   albuterol 108 (90 Base) MCG/ACT inhaler Commonly known as:  PROVENTIL HFA;VENTOLIN HFA   calcium acetate 667 MG capsule Commonly known as:  PHOSLO   darunavir 800 MG tablet Commonly known as:  PREZISTA TAKE 1 TABLET BY MOUTH AT BEDTIME WITH NORVIR   docusate sodium 100 MG capsule Commonly known as:  COLACE Take 1 capsule (100 mg total) by mouth daily.   dolutegravir 50 MG tablet Commonly known as:  TIVICAY Take 1 tablet (50 mg total) by mouth at bedtime.   MULTIVITAMIN PO   pantoprazole 40 MG tablet Commonly known as:  PROTONIX TAKE 1 TABLET BY MOUTH TWICE DAILY   ritonavir 100 MG Tabs tablet Commonly known as:  NORVIR TAKE 1 TABLET BY MOUTH AT BEDTIME. TAKE WITH PREZISTA TABLET   tenofovir 300 MG tablet Commonly known as:  VIREAD TAKE 1 TABLET BY MOUTH ONCE A WEEK ON SUNDAYS AT BEDTIME   valACYclovir 500 MG tablet Commonly known as:  VALTREX Take 1 tablet  (500 mg total) by mouth daily as needed (herpes outbreaks).       Subjective: [name] is in for his routine HIV follow-up visit.he is doing well. He was out of Tiv[place]ay for 2 days when his pharmacy was late with a refill. He went ahead and took his other medications. He thinks he may have missed one or 2 other doses in the past 6 months. He continues to smoke cigarettes and has no current plan to quit.   Review of Systems: Review of Systems  Constitutional: Negative for chills, diaphoresis, fever, malaise/fatigue and weight loss.  HENT: Negative for sore throat.   Respiratory: Negative for cough, sputum production and shortness of breath.   Cardiovascular: Negative for chest pain.  Gastrointestinal: Positive for diarrhea. Negative for abdominal pain, heartburn, nausea and vomiting.  Genitourinary: Negative for dysuria and frequency.  Musculoskeletal: Negative for joint pain and myalgias.  Skin: Negative for rash.  Neurological: Negative for dizziness and headaches.  Psychiatric/Behavioral: Negative for depression and substance abuse. The patient is not nervous/anxious.     Past Medical History:  Diagnosis Date  . Anal condyloma   . Anemia   . Diarrhea    occurs because of dialysis  . DJD (degenerative joint disease)    right knee.  All joints  . ESRD (end stage  renal disease) on dialysis ([place]) 01/02/2012   Gets diaysis at [facility] on [employer], TTS schedule.  Started dialysis in 2000.     . Family history of disseminated HSV infection   . Genital herpes   . Hemodialysis patient [facility])    Tuesday, Thursday, Saturday  . History of blood transfusion   . HIV infection ([place])   . Hypertension   . Pneumonia 1/20107   hx of  . Shortness of breath    "can happen at anytime" (03/04/2013).  03/14/16- "when I have too much fluid"  . Tertiary syphilis   . Thrombosis of dialysis vascular access ([place]) 01/02/2012   RUA AV fistula clotted on 02/27/13, declotted by IR. Reclotted on 03/02/13 and  IR placed L IJ tunneled HD catheter (a right external jugular tunneled HD cath was attempted first but did not function due to kinking across the clavicle). Pt was seen by VVS on same admission 6/614 and said RUA AVF no longer usable, they ordered vein mapping and will see him as outpatient to set up next permanent access.       Social History   Tobacco Use  . Smoking status: Heavy Tobacco Smoker    Packs/day: 0.50    Years: 27.00    Pack years: 13.50    Types: Cigarettes  . Smokeless tobacco: Never Used  Substance Use Topics  . Alcohol use: No    Alcohol/week: 0.0 oz    Comment: 03/04/2013 "stopped drinking ~ 12 yr ago; never had problem w/it".    . Drug use: No    Family History  Problem Relation Age of Onset  . Diabetes Mother   . Arthritis Father        Gout- Foot    Allergies  Allergen Reactions  . Penicillins Hives and Other (See Comments)    Rx required Hospitalization Has patient had a PCN reaction causing immediate rash, facial/tongue/throat swelling, SOB or lightheadedness with hypotension: Yes Has patient had a PCN reaction causing severe rash involving mucus membranes or skin necrosis: No Has patient had a PCN reaction that required hospitalization Yes Has patient had a PCN reaction occurring within the last 10 years: No   . Contrast Media [Iodinated Diagnostic Agents] Hives and Itching  . Acetaminophen-Codeine Nausea And Vomiting  . Oxycodone-Acetaminophen Other (See Comments)    Reaction unknown  . Tape Rash and Other (See Comments)    Use paper tape only (NO CLEAR PLASTIC TAPE!!)    Health Maintenance  Topic Date Due  . TETANUS/TDAP  03/12/1987  . INFLUENZA VACCINE  04/29/2017  . HIV Screening  Completed    Objective:  Vitals:   09/15/17 0858  Weight: 154 lb (69.9 kg)  Height: _0  (1.803 m)   Body mass index is 21.48 kg/m.  Physical Exam  Constitutional: He is oriented to person, place, and time.  He is joking and in good spirits as usual.   HENT:  Mouth/Throat: No oropharyngeal exudate.  Eyes: Conjunctivae are normal.  Cardiovascular: Normal rate and regular rhythm.  Murmur heard. 2/6 early systolic murmur.  Pulmonary/Chest: Effort normal and breath sounds normal.  Abdominal: Soft. He exhibits no mass. There is no tenderness.  Musculoskeletal: Normal range of motion.  Neurological: He is alert and oriented to person, place, and time.  Skin: No rash noted.  Psychiatric: Mood and affect normal.    Lab Results Lab Results  Component Value Date   WBC 5.3 02/05/2017   HGB 13.6 04/15/2017   HCT  40.0 04/15/2017   MCV 92.1 02/05/2017   PLT 97 (L) 02/05/2017    Lab Results  Component Value Date   CREATININE 9.90 (H) 04/15/2017   BUN 14 04/15/2017   NA 143 04/15/2017   K 4.2 04/15/2017   CL 102 04/15/2017   CO2 24 02/05/2017    Lab Results  Component Value Date   ALT 8 (L) 02/05/2017   AST 10 02/05/2017   ALKPHOS 215 (H) 02/05/2017   BILITOT 0.4 02/05/2017    Lab Results  Component Value Date   CHOL 190 02/05/2017   HDL 48 02/05/2017   LDLCALC 117 (H) 02/05/2017   TRIG 125 02/05/2017   CHOLHDL 4.0 02/05/2017   Lab Results  Component Value Date   LABRPR NON REAC 02/05/2017   HIV 1 RNA Quant (copies/mL)  Date Value  02/05/2017 <20 DETECTED (A)  12/08/2016 250  08/19/2016 <20   CD4 T Cell Abs (/uL)  Date Value  02/05/2017 810  12/08/2016 990  08/19/2016 960     Problem List Items Addressed This Visit      Medium   HIV disease ([place])    I told him to think of his 4 drug HIV regimen as all or none I also asked him to let us know if his pharmacy continues to be late with refills. He will continue his current medications and get blood work today. He has already received his influenza vaccination at dialysis. He will follow-up in 6 months.      Relevant Orders   T-helper cell (CD4)- ([facility] clinic only)   HIV 1 RNA quant-no reflex-bld        Michel Bickers, MD [facility] for [place] [phone] pager   [phone] cell 09/15/2017, 9:07 AM

## 2017-09-15 NOTE — Assessment & Plan Note (Signed)
I told him to think of his 4 drug HIV regimen as all or none I also asked him to let us know if his pharmacy continues to be late with refills. He will continue his current medications and get blood work today. He has already received his influenza vaccination at dialysis. He will follow-up in 6 months.

## 2017-09-16 LAB — T-HELPER CELL (CD4) - (RCID CLINIC ONLY)
CD4 % Helper T Cell: 36 % (ref 33–55)
CD4 T CELL ABS: 780 /uL (ref 400–2700)

## 2017-09-17 LAB — HIV-1 RNA QUANT-NO REFLEX-BLD
HIV 1 RNA Quant: <20 copies/mL
HIV-1 RNA QUANT, LOG: NOT DETECTED {Log_copies}/mL

## 2017-10-07 ENCOUNTER — Ambulatory Visit: Payer: Medicare Other

## 2017-10-08 ENCOUNTER — Inpatient Hospital Stay (HOSPITAL_COMMUNITY)
Admission: EM | Admit: 2017-10-08 | Discharge: 2017-10-09 | DRG: 974 | Disposition: A | Discharge status: Home / Self Care | Payer: Medicare Other | Attending: Family Medicine | Admitting: Family Medicine

## 2017-10-08 ENCOUNTER — Other Ambulatory Visit: Payer: Self-pay

## 2017-10-08 ENCOUNTER — Encounter (HOSPITAL_COMMUNITY): Payer: Self-pay | Admitting: Emergency Medicine

## 2017-10-08 DIAGNOSIS — A6000 Herpesviral infection of urogenital system, unspecified: Secondary | ICD-10-CM | POA: Diagnosis not present

## 2017-10-08 DIAGNOSIS — Z880 Allergy status to penicillin: Secondary | ICD-10-CM

## 2017-10-08 DIAGNOSIS — A630 Anogenital (venereal) warts: Secondary | ICD-10-CM | POA: Diagnosis not present

## 2017-10-08 DIAGNOSIS — N186 End stage renal disease: Secondary | ICD-10-CM | POA: Diagnosis not present

## 2017-10-08 DIAGNOSIS — Z91041 Radiographic dye allergy status: Secondary | ICD-10-CM

## 2017-10-08 DIAGNOSIS — R402413 Glasgow coma scale score 13-15, at hospital admission: Secondary | ICD-10-CM | POA: Diagnosis present

## 2017-10-08 DIAGNOSIS — B029 Zoster without complications: Secondary | ICD-10-CM | POA: Diagnosis present

## 2017-10-08 DIAGNOSIS — B20 Human immunodeficiency virus [HIV] disease: Principal | ICD-10-CM | POA: Diagnosis present

## 2017-10-08 DIAGNOSIS — B028 Zoster with other complications: Secondary | ICD-10-CM | POA: Diagnosis not present

## 2017-10-08 DIAGNOSIS — I120 Hypertensive chronic kidney disease with stage 5 chronic kidney disease or end stage renal disease: Secondary | ICD-10-CM | POA: Diagnosis present

## 2017-10-08 DIAGNOSIS — Z885 Allergy status to narcotic agent status: Secondary | ICD-10-CM

## 2017-10-08 DIAGNOSIS — Z86718 Personal history of other venous thrombosis and embolism: Secondary | ICD-10-CM | POA: Diagnosis not present

## 2017-10-08 DIAGNOSIS — K219 Gastro-esophageal reflux disease without esophagitis: Secondary | ICD-10-CM | POA: Diagnosis not present

## 2017-10-08 DIAGNOSIS — M1990 Unspecified osteoarthritis, unspecified site: Secondary | ICD-10-CM | POA: Diagnosis not present

## 2017-10-08 DIAGNOSIS — I350 Nonrheumatic aortic (valve) stenosis: Secondary | ICD-10-CM | POA: Diagnosis present

## 2017-10-08 DIAGNOSIS — E872 Acidosis: Secondary | ICD-10-CM | POA: Diagnosis present

## 2017-10-08 DIAGNOSIS — Z992 Dependence on renal dialysis: Secondary | ICD-10-CM

## 2017-10-08 DIAGNOSIS — Z8711 Personal history of peptic ulcer disease: Secondary | ICD-10-CM

## 2017-10-08 DIAGNOSIS — M1711 Unilateral primary osteoarthritis, right knee: Secondary | ICD-10-CM | POA: Diagnosis present

## 2017-10-08 DIAGNOSIS — A529 Late syphilis, unspecified: Secondary | ICD-10-CM | POA: Diagnosis not present

## 2017-10-08 DIAGNOSIS — D696 Thrombocytopenia, unspecified: Secondary | ICD-10-CM | POA: Diagnosis present

## 2017-10-08 DIAGNOSIS — Z886 Allergy status to analgesic agent status: Secondary | ICD-10-CM

## 2017-10-08 DIAGNOSIS — D631 Anemia in chronic kidney disease: Secondary | ICD-10-CM | POA: Diagnosis present

## 2017-10-08 DIAGNOSIS — F17210 Nicotine dependence, cigarettes, uncomplicated: Secondary | ICD-10-CM | POA: Diagnosis not present

## 2017-10-08 DIAGNOSIS — Z79899 Other long term (current) drug therapy: Secondary | ICD-10-CM

## 2017-10-08 DIAGNOSIS — N2581 Secondary hyperparathyroidism of renal origin: Secondary | ICD-10-CM | POA: Diagnosis not present

## 2017-10-08 DIAGNOSIS — Z91048 Other nonmedicinal substance allergy status: Secondary | ICD-10-CM

## 2017-10-08 LAB — CBC WITH DIFFERENTIAL/PLATELET
BASOS PCT: 0 %
Basophils Absolute: 0 10*3/uL (ref 0.0–0.1)
EOS ABS: 0.1 10*3/uL (ref 0.0–0.7)
EOS PCT: 4 %
HCT: 31.2 % — ABNORMAL LOW (ref 39.0–52.0)
Hemoglobin: 9.7 g/dL — ABNORMAL LOW (ref 13.0–17.0)
LYMPHS ABS: 1.7 10*3/uL (ref 0.7–4.0)
Lymphocytes Relative: 48 %
MCH: 30.1 pg (ref 26.0–34.0)
MCHC: 31.1 g/dL (ref 30.0–36.0)
MCV: 96.9 fL (ref 78.0–100.0)
MONO ABS: 0.4 10*3/uL (ref 0.1–1.0)
MONOS PCT: 11 %
Neutro Abs: 1.3 10*3/uL — ABNORMAL LOW (ref 1.7–7.7)
Neutrophils Relative %: 37 %
PLATELETS: 65 10*3/uL — AB (ref 150–400)
RBC: 3.22 MIL/uL — ABNORMAL LOW (ref 4.22–5.81)
RDW: 14.6 % (ref 11.5–15.5)
WBC: 3.6 10*3/uL — ABNORMAL LOW (ref 4.0–10.5)

## 2017-10-08 LAB — COMPREHENSIVE METABOLIC PANEL
ALK PHOS: 304 U/L — AB (ref 38–126)
ALT: 12 U/L — ABNORMAL LOW (ref 17–63)
ANION GAP: 13 (ref 5–15)
AST: 15 U/L (ref 15–41)
Albumin: 3 g/dL — ABNORMAL LOW (ref 3.5–5.0)
BUN: 17 mg/dL (ref 6–20)
CALCIUM: 9.3 mg/dL (ref 8.9–10.3)
CHLORIDE: 101 mmol/L (ref 101–111)
CO2: 27 mmol/L (ref 22–32)
Creatinine, Ser: 8.94 mg/dL — ABNORMAL HIGH (ref 0.61–1.24)
GFR calc non Af Amer: 6 mL/min — ABNORMAL LOW (ref >60)
GFR, EST AFRICAN AMERICAN: 7 mL/min — AB (ref >60)
Glucose, Bld: 77 mg/dL (ref 65–99)
POTASSIUM: 4.5 mmol/L (ref 3.5–5.1)
SODIUM: 141 mmol/L (ref 135–145)
Total Bilirubin: 1.7 mg/dL — ABNORMAL HIGH (ref 0.3–1.2)
Total Protein: 7.2 g/dL (ref 6.5–8.1)

## 2017-10-08 LAB — CREATININE, SERUM
CREATININE: 9.57 mg/dL — AB (ref 0.61–1.24)
GFR, EST AFRICAN AMERICAN: 7 mL/min — AB (ref >60)
GFR, EST NON AFRICAN AMERICAN: 6 mL/min — AB (ref >60)

## 2017-10-08 LAB — CBC
HCT: 32.7 % — ABNORMAL LOW (ref 39.0–52.0)
HEMOGLOBIN: 10.3 g/dL — AB (ref 13.0–17.0)
MCH: 30.7 pg (ref 26.0–34.0)
MCHC: 31.5 g/dL (ref 30.0–36.0)
MCV: 97.3 fL (ref 78.0–100.0)
Platelets: 57 10*3/uL — ABNORMAL LOW (ref 150–400)
RBC: 3.36 MIL/uL — ABNORMAL LOW (ref 4.22–5.81)
RDW: 14.6 % (ref 11.5–15.5)
WBC: 2.8 10*3/uL — ABNORMAL LOW (ref 4.0–10.5)

## 2017-10-08 LAB — I-STAT CG4 LACTIC ACID, ED: Lactic Acid, Venous: 1.93 mmol/L — ABNORMAL HIGH (ref 0.5–1.9)

## 2017-10-08 MED ORDER — POLYETHYLENE GLYCOL 3350 17 G PO PACK: 17.0000 g | PACK | Freq: Every day | ORAL | Status: DC | PRN

## 2017-10-08 MED ORDER — HYDROMORPHONE HCL 1 MG/ML IJ SOLN
1.0000 mg | INTRAMUSCULAR | Status: DC | PRN
  Administered 2017-10-08 – 2017-10-09 (×2): 1 mg via INTRAVENOUS
  Filled 2017-10-08 (×2): qty 1

## 2017-10-08 MED ORDER — MORPHINE SULFATE (PF) 4 MG/ML IV SOLN
4.0000 mg | Freq: Once | INTRAVENOUS | Status: AC
  Administered 2017-10-08: 4 mg via INTRAVENOUS
  Filled 2017-10-08: qty 1

## 2017-10-08 MED ORDER — DEXTROSE 5 % IV SOLN
5.0000 mg/kg | INTRAVENOUS | Status: DC
  Filled 2017-10-08: qty 7

## 2017-10-08 MED ORDER — SODIUM CHLORIDE 0.9 % IV SOLN: 100.0000 mL | INTRAVENOUS | Status: DC | PRN

## 2017-10-08 MED ORDER — DEXTROSE 5 % IV SOLN
5.0000 mg/kg | Freq: Once | INTRAVENOUS | Status: AC
  Administered 2017-10-08: 350 mg via INTRAVENOUS
  Filled 2017-10-08: qty 7

## 2017-10-08 MED ORDER — ONDANSETRON HCL 4 MG/2ML IJ SOLN: 4.0000 mg | Freq: Four times a day (QID) | INTRAMUSCULAR | Status: DC | PRN

## 2017-10-08 MED ORDER — ONDANSETRON HCL 4 MG PO TABS: 4.0000 mg | ORAL_TABLET | Freq: Four times a day (QID) | ORAL | Status: DC | PRN

## 2017-10-08 MED ORDER — DARUNAVIR ETHANOLATE 800 MG PO TABS
800.0000 mg | ORAL_TABLET | Freq: Every day | ORAL | Status: DC
  Administered 2017-10-09: 800 mg via ORAL
  Filled 2017-10-08 (×2): qty 1

## 2017-10-08 MED ORDER — DIPHENHYDRAMINE HCL 25 MG PO CAPS
50.0000 mg | ORAL_CAPSULE | Freq: Once | ORAL | Status: AC
  Administered 2017-10-08: 50 mg via ORAL
  Filled 2017-10-08: qty 2

## 2017-10-08 MED ORDER — RITONAVIR 100 MG PO TABS
100.0000 mg | ORAL_TABLET | Freq: Every day | ORAL | Status: DC
  Administered 2017-10-08: 100 mg via ORAL
  Filled 2017-10-08: qty 1

## 2017-10-08 MED ORDER — DOLUTEGRAVIR SODIUM 50 MG PO TABS
50.0000 mg | ORAL_TABLET | Freq: Every day | ORAL | Status: DC
  Administered 2017-10-08: 50 mg via ORAL
  Filled 2017-10-08: qty 1

## 2017-10-08 MED ORDER — PREDNISONE 20 MG PO TABS
40.0000 mg | ORAL_TABLET | Freq: Every day | ORAL | Status: DC
  Administered 2017-10-09: 40 mg via ORAL
  Filled 2017-10-08: qty 2

## 2017-10-08 MED ORDER — ALTEPLASE 2 MG IJ SOLR
2.0000 mg | Freq: Once | INTRAMUSCULAR | Status: DC | PRN
  Filled 2017-10-08: qty 2

## 2017-10-08 MED ORDER — HEPARIN SODIUM (PORCINE) 1000 UNIT/ML DIALYSIS
1000.0000 [IU] | INTRAMUSCULAR | Status: DC | PRN
  Filled 2017-10-08: qty 1

## 2017-10-08 MED ORDER — PENTAFLUOROPROP-TETRAFLUOROETH EX AERO: 1.0000 "application " | INHALATION_SPRAY | CUTANEOUS | Status: DC | PRN

## 2017-10-08 MED ORDER — CALCIUM ACETATE (PHOS BINDER) 667 MG PO CAPS
1334.0000 mg | ORAL_CAPSULE | ORAL | Status: DC | PRN
  Filled 2017-10-08: qty 2

## 2017-10-08 MED ORDER — HEPARIN SODIUM (PORCINE) 5000 UNIT/ML IJ SOLN
5000.0000 [IU] | Freq: Three times a day (TID) | INTRAMUSCULAR | Status: DC
  Administered 2017-10-08: 5000 [IU] via SUBCUTANEOUS
  Filled 2017-10-08 (×2): qty 1

## 2017-10-08 MED ORDER — CALCIUM ACETATE (PHOS BINDER) 667 MG PO CAPS
2668.0000 mg | ORAL_CAPSULE | Freq: Three times a day (TID) | ORAL | Status: DC
  Administered 2017-10-09: 2668 mg via ORAL
  Filled 2017-10-08 (×2): qty 4

## 2017-10-08 MED ORDER — LIDOCAINE HCL (PF) 1 % IJ SOLN: 5.0000 mL | INTRAMUSCULAR | Status: DC | PRN

## 2017-10-08 MED ORDER — LIDOCAINE-PRILOCAINE 2.5-2.5 % EX CREA: 1.0000 "application " | TOPICAL_CREAM | CUTANEOUS | Status: DC | PRN

## 2017-10-08 NOTE — ED Notes (Signed)
CBC/Creatinine attempted to be collected x1 by tech unsuccessfully.

## 2017-10-08 NOTE — Consult Note (Signed)
[facility] Renal Consultation Note    Indication for Consultation:  Management of ESRD/hemodialysis, anemia, hypertension/volume, and secondary hyperparathyroidism. PCP:  HPI: [name] is a 52 y.o. male with ESRD, HIV, HTN, and GERD who was admitted with herpes zoster.   [name] reports that he first noted a small area of rash on his L anterior chest on Tuesday, no pain or itching at that time. The next morning, the rash had spread significantly up his L neck and over his L shoulder to his upper back. Throughout the day, it became increasingly painful. His wife applied calamine lotion without relief, which prompted ED eval. In ED, vitals were fine. Labs showed K 4.5, WBC 3.6, Hgb 9.7. He was given morphine for pain which helped tremendously. Given his HIV status and rapid expanse of affected area, he was admitted for monitoring and treatment. Has been started on IV acycylovir, prednisone, and dilaudid for pain. Pain much improved at time of my visit. Denies CP, dyspnea, fever, chills, N/V or diarrhea.  From a renal standpoint, dialyzes TTS at [facility]. Last HD was 10/06/17 which he completed in entirety. Uses LUE AVG without recent issues.  Past Medical History:  Diagnosis Date  . Anal condyloma   . Anemia   . Diarrhea    occurs because of dialysis  . DJD (degenerative joint disease)    right knee.  All joints  . ESRD (end stage renal disease) on dialysis ([facility]) 01/02/2012   Gets diaysis at [facility] on [employer], TTS schedule.  Started dialysis in 2000.     . Family history of disseminated HSV infection   . Genital herpes   . Hemodialysis patient [facility])    Tuesday, Thursday, Saturday  . History of blood transfusion   . HIV infection ([facility])   . Hypertension   . Pneumonia 1/20107   hx of  . Shortness of breath    "can happen at anytime" (03/04/2013).  03/14/16- "when I have too much fluid"  . Tertiary syphilis   . Thrombosis of dialysis vascular access ([facility])  01/02/2012   RUA AV fistula clotted on 02/27/13, declotted by IR. Reclotted on 03/02/13 and IR placed L IJ tunneled HD catheter (a right external jugular tunneled HD cath was attempted first but did not function due to kinking across the clavicle). Pt was seen by VVS on same admission 6/614 and said RUA AVF no longer usable, they ordered vein mapping and will see him as outpatient to set up next permanent access.      Past Surgical History:  Procedure Laterality Date  . A/V SHUNTOGRAM Left 04/15/2017   Procedure: A/V Shuntogram;  Surgeon: Maeola Harman, MD;  Location[facility] INVASIVE CV LAB;  Service: Cardiovascular;  Laterality: Left;  . APPENDECTOMY    . AV FISTULA PLACEMENT Right 06/16/11  . AV FISTULA PLACEMENT Left 03/17/2016   Procedure: INSERTION OF ARTERIOVENOUS (AV) GORE-TEX GRAFT ARM USING GORETEX 4-7MM X 45CM STRETCH GRAFT;  Surgeon: Sherren Kerns, MD;  Location: [facility] OR;  Service: Vascular;  Laterality: Left;  . AV FISTULA PLACEMENT, BRACHIOCEPHALIC Right 06/16/11  . ESOPHAGOGASTRODUODENOSCOPY N/A 12/09/2016   Procedure: ESOPHAGOGASTRODUODENOSCOPY (EGD);  Surgeon: Kathi Der, MD;  Location: [facility] ENDOSCOPY;  Service: Gastroenterology;  Laterality: N/A;  . ESOPHAGOGASTRODUODENOSCOPY N/A 01/05/2017   Procedure: ESOPHAGOGASTRODUODENOSCOPY (EGD);  Surgeon: Vida Rigger, MD;  Location: [facility] ENDOSCOPY;  Service: Endoscopy;  Laterality: N/A;  . ESOPHAGOGASTRODUODENOSCOPY (EGD) WITH PROPOFOL N/A 01/06/2017   Procedure: ESOPHAGOGASTRODUODENOSCOPY (EGD) WITH PROPOFOL;  Surgeon:  Kathi Der, MD;  Location: [facility] ENDOSCOPY;  Service: Gastroenterology;  Laterality: N/A;  . INCISION AND DRAINAGE ABSCESS     abdominal  . PERIPHERAL VASCULAR CATHETERIZATION N/A 03/07/2016   Procedure: Venogram;  Surgeon: Sherren Kerns, MD;  Location: [facility] INVASIVE CV LAB;  Service: Cardiovascular;  Laterality: N/A;  . PERIPHERAL VASCULAR INTERVENTION Left 04/15/2017   Procedure: Peripheral Vascular Intervention;   Surgeon: Maeola Harman, MD;  Location: [facility] INVASIVE CV LAB;  Service: Cardiovascular;  Laterality: Left;  . REVISON OF ARTERIOVENOUS FISTULA Right 04/20/2013   Procedure: INSERTION OF ARTERIOVENOUS GORTEX GRAFT;  Surgeon: Sherren Kerns, MD;  Location: [facility] OR;  Service: Vascular;  Laterality: Right;  Ultrasound Guided  . TONSILLECTOMY     Family History  Problem Relation Age of Onset  . Diabetes Mother   . Arthritis Father        Gout- Foot   Social History:  reports that he has been smoking cigarettes.  He has a 13.50 pack-year smoking history. he has never used smokeless tobacco. He reports that he does not drink alcohol or use drugs.  ROS: As per HPI otherwise negative.  Physical Exam: Vitals:   10/08/17 0422 10/08/17 0829 10/08/17 0830 10/08/17 0900  BP:  129/76 (!) 133/97 119/76  Pulse:      Resp:      SpO2:      Weight: 70.3 kg (155 lb)     Height: 5\' 11"  (1.803 m)        General: Well developed, well nourished, in no acute distress. Head: Normocephalic, atraumatic, sclera non-icteric, mucus membranes are moist. Skin: Erythematous, vesicular rash across upper L chest over should onto top of L back, extends slightly down his L arm but stops prior to his AVG. Lungs: Clear bilaterally to auscultation without wheezes, rales, or rhonchi.  Heart: RRR; 2/6 systolic murmur noted Abdomen: Soft, non-tender, non-distended with normoactive bowel sounds. No rebound/guarding. No obvious abdominal masses. Musculoskeletal:  Strength and tone appear normal for age. Lower extremities: 2+ pitting edema in B LE, up to knees. Neuro: Alert and oriented X 3. Moves all extremities spontaneously. Psych:  Responds to questions appropriately, slightly loopy appearing (s/p morphine). Dialysis Access: LUE AVG + bruit  Allergies  Allergen Reactions  . Penicillins Hives and Other (See Comments)    Rx required Hospitalization Has patient had a PCN reaction causing immediate rash,  facial/tongue/throat swelling, SOB or lightheadedness with hypotension: Yes Has patient had a PCN reaction causing severe rash involving mucus membranes or skin necrosis: No Has patient had a PCN reaction that required hospitalization Yes Has patient had a PCN reaction occurring within the last 10 years: No   . Contrast Media [Iodinated Diagnostic Agents] Hives and Itching  . Acetaminophen-Codeine Nausea And Vomiting  . Oxycodone-Acetaminophen Other (See Comments)    Reaction unknown  . Tape Rash and Other (See Comments)    Use paper tape only (NO CLEAR PLASTIC TAPE!!)   Prior to Admission medications   Medication Sig Start Date End Date Taking? Authorizing Provider  albuterol (PROVENTIL HFA;VENTOLIN HFA) 108 (90 Base) MCG/ACT inhaler Inhale 2 puffs into the lungs every 6 (six) hours as needed for wheezing or shortness of breath.   Yes [provider]  calcium acetate (PHOSLO) 667 MG capsule Take 1,334-2,668 mg by mouth See admin instructions. Take 1334 mg by mouth three times a day with meals and 2,668 mg (also) with each snack   Yes [provider]  darunavir (PREZISTA) 800 MG tablet  TAKE 1 TABLET BY MOUTH AT BEDTIME WITH NORVIR Patient taking differently: Take 800 mg by mouth at bedtime.  03/12/17  Yes Cliffton [name], MD  dolutegravir (TIVICAY) 50 MG tablet Take 1 tablet (50 mg total) by mouth at bedtime. 03/12/17  Yes Cliffton [name], MD  Multiple Vitamins-Minerals (MULTIVITAMIN PO) Take 1 tablet by mouth daily.   Yes [provider]  ritonavir (NORVIR) 100 MG TABS tablet TAKE 1 TABLET BY MOUTH AT BEDTIME. TAKE WITH PREZISTA TABLET Patient taking differently: Take 100 mg by mouth at bedtime.  03/12/17  Yes Cliffton [name], MD  tenofovir (VIREAD) 300 MG tablet TAKE 1 TABLET BY MOUTH ONCE A WEEK ON SUNDAYS AT BEDTIME Patient taking differently: Take 300 mg by mouth every Sunday.  03/12/17  Yes Cliffton [name], MD  valACYclovir (VALTREX) 500 MG tablet Take 1 tablet (500  mg total) by mouth daily as needed (herpes outbreaks). 09/01/16  Yes Cliffton [name], MD  docusate sodium (COLACE) 100 MG capsule Take 1 capsule (100 mg total) by mouth daily. Patient not taking: Reported on 04/15/2017 12/10/16   Renne Musca, MD  pantoprazole (PROTONIX) 40 MG tablet TAKE 1 TABLET BY MOUTH TWICE DAILY Patient not taking: Reported on 08/17/2017 01/08/17   Almon Hercules, MD   Current Facility-Administered Medications  Medication Dose Route Frequency Provider Last Rate Last Dose  . HYDROmorphone (DILAUDID) injection 1 mg  1 mg Intravenous Q3H PRN Diallo, Abdoulaye, MD       Current Outpatient Medications  Medication Sig Dispense Refill  . albuterol (PROVENTIL HFA;VENTOLIN HFA) 108 (90 Base) MCG/ACT inhaler Inhale 2 puffs into the lungs every 6 (six) hours as needed for wheezing or shortness of breath.    . calcium acetate (PHOSLO) 667 MG capsule Take 1,334-2,668 mg by mouth See admin instructions. Take 1334 mg by mouth three times a day with meals and 2,668 mg (also) with each snack    . darunavir (PREZISTA) 800 MG tablet TAKE 1 TABLET BY MOUTH AT BEDTIME WITH NORVIR (Patient taking differently: Take 800 mg by mouth at bedtime. ) 90 tablet 1  . dolutegravir (TIVICAY) 50 MG tablet Take 1 tablet (50 mg total) by mouth at bedtime. 90 tablet 1  . Multiple Vitamins-Minerals (MULTIVITAMIN PO) Take 1 tablet by mouth daily.    . ritonavir (NORVIR) 100 MG TABS tablet TAKE 1 TABLET BY MOUTH AT BEDTIME. TAKE WITH PREZISTA TABLET (Patient taking differently: Take 100 mg by mouth at bedtime. ) 90 tablet 1  . tenofovir (VIREAD) 300 MG tablet TAKE 1 TABLET BY MOUTH ONCE A WEEK ON SUNDAYS AT BEDTIME (Patient taking differently: Take 300 mg by mouth every Sunday. ) 12 tablet 1  . valACYclovir (VALTREX) 500 MG tablet Take 1 tablet (500 mg total) by mouth daily as needed (herpes outbreaks). 30 tablet 11  . docusate sodium (COLACE) 100 MG capsule Take 1 capsule (100 mg total) by mouth daily. (Patient  not taking: Reported on 04/15/2017) 10 capsule 0  . pantoprazole (PROTONIX) 40 MG tablet TAKE 1 TABLET BY MOUTH TWICE DAILY (Patient not taking: Reported on 08/17/2017) 180 tablet 0   Labs: Basic Metabolic Panel: Recent Labs  Lab 10/08/17 0801  NA 141  K 4.5  CL 101  CO2 27  GLUCOSE 77  BUN 17  CREATININE 8.94*  CALCIUM 9.3   Liver Function Tests: Recent Labs  Lab 10/08/17 0801  AST 15  ALT 12*  ALKPHOS 304*  BILITOT 1.7*  PROT 7.2  ALBUMIN 3.0*   CBC: Recent  Labs  Lab 10/08/17 0801  WBC 3.6*  NEUTROABS 1.3*  HGB 9.7*  HCT 31.2*  MCV 96.9  PLT 65*   Dialysis Orders:  [facility] 400/800, 2K/2.5Ca, EDW 67.5kg, profile 4/linear Na, no heparin - Hectoral 4mcg IV q HD - Venofer 50mg  IV weekly  Assessment/Plan: 1.  Herpes Zoster (L C3-C8 distribution): Started on renally-dosed Acycylovir, prednisone, and narcotics for pain control. Per primary team. 2.  ESRD: Usually TTS schedule, due for HD today. Wants to do HD tomorrow instead given pain, labs are fine. Will order HD for 1/11.  3.  Hypertension/volume: BP fine, no dyspnea. Chronic LE edema, will lower EDW. 4.  Anemia: Hgb 9.7, follow without ESA for now. 5.  Metabolic bone disease: Ca ok, Phos pending. Continue VDRA/binders. 6.  HIV: HAART meds, per primary.  Ozzie HoyleKatie Stovall, PA-C 10/08/2017, 12:57 PM  [facility] Pager: [phone]  Pt seen, examined and agree w A/P as above.  Vinson Moselleob Minette Manders MD [facility] pager [phone]   10/08/2017, 2:53 PM

## 2017-10-08 NOTE — ED Triage Notes (Signed)
Pt presents with blistery rash to L neck, shoulder, chest area that he reports is painful

## 2017-10-08 NOTE — ED Provider Notes (Signed)
[facility] EMERGENCY DEPARTMENT Provider Note   CSN: [id number] Arrival date & time: 10/08/17  0406     History   Chief Complaint Chief Complaint  Patient presents with  . Rash    HPI [name] is a 52 y.o. male.  The history is provided by the patient and medical records. No language interpreter was used.  Rash   This is a new problem. The current episode started more than 2 days ago. The problem has been rapidly worsening. The problem is associated with nothing. There has been no fever. The rash is present on the torso and trunk. The pain is at a severity of 10/10. The pain is severe. The pain has been constant since onset. Associated symptoms include blisters and pain. He has tried anti-itch cream (valtrex) for the symptoms. The treatment provided no relief.    Past Medical History:  Diagnosis Date  . Anal condyloma   . Anemia   . Diarrhea    occurs because of dialysis  . DJD (degenerative joint disease)    right knee.  All joints  . ESRD (end stage renal disease) on dialysis ([place]) 01/02/2012   Gets diaysis at [facility] on [address], TTS schedule.  Started dialysis in 2000.     . Family history of disseminated HSV infection   . Genital herpes   . Hemodialysis patient [facility])    Tuesday, Thursday, Saturday  . History of blood transfusion   . HIV infection ([place])   . Hypertension   . Pneumonia 1/20107   hx of  . Shortness of breath    "can happen at anytime" (03/04/2013).  03/14/16- "when I have too much fluid"  . Tertiary syphilis   . 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 Reclotted on 03/02/13 and IR placed L IJ tunneled HD catheter (a right external jugular tunneled HD cath was attempted first but did not function due to kinking across the clavicle). Pt was seen by [facility] on same admission 6/614 and said RUA AVF no longer usable, they ordered vein mapping and will see him as  outpatient to set up next permanent access.       Patient Active Problem List   Diagnosis Date Noted  . Nonrheumatic aortic valve stenosis   . PUD (peptic ulcer disease)   . Anemia 01/01/2017  . Toe ulcer ([place])   . GI bleed 12/07/2016  . Gastroesophageal reflux disease 09/13/2016  . Annual physical exam 09/13/2016  . Normocytic anemia 10/14/2015  . Thrombocytopenia ([place]) 10/14/2015  . Gall stone   . 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 ESRD (end stage renal disease) on dialysis 01/02/2012  . PERIPHERAL NEUROPATHY 04/10/2009  . BACTEREMIA, MYCOBACTERIUM AVIUM COMPLEX 10/19/2006  . HIV disease ([place]) 10/19/2006  . HERPES, GENITAL NEC 10/19/2006  . WARTS, OTHER SPECIFIED VIRAL 10/19/2006  . SYPHILIS NOS 10/19/2006  . CIGARETTE SMOKER 10/19/2006  . Depression 10/19/2006    Past Surgical History:  Procedure Laterality Date  . A/V SHUNTOGRAM Left 04/15/2017   Procedure: A/V Shuntogram;  Surgeon: Waynetta Sandy, MD;  Location: [facility];  Service: Cardiovascular;  Laterality: Left;  . APPENDECTOMY    . AV FISTULA PLACEMENT Right 06/16/11  . AV FISTULA PLACEMENT Left 03/17/2016   Procedure: INSERTION OF ARTERIOVENOUS (AV) GORE-TEX GRAFT ARM USING GORETEX 4-7MM X 45CM STRETCH GRAFT;  Surgeon: Elam Dutch, MD;  Location: [place];  Service: Vascular;  Laterality: Left;  . AV FISTULA PLACEMENT, BRACHIOCEPHALIC Right 96/75/91  . ESOPHAGOGASTRODUODENOSCOPY N/A 12/09/2016   Procedure: ESOPHAGOGASTRODUODENOSCOPY (EGD);  Surgeon: Otis Brace, MD;  Location: [place];  Service: Gastroenterology;  Laterality: N/A;  . ESOPHAGOGASTRODUODENOSCOPY N/A 01/05/2017   Procedure: ESOPHAGOGASTRODUODENOSCOPY (EGD);  Surgeon: Clarene Essex, MD;  Location: [facility];  Service: Endoscopy;  Laterality: N/A;  . ESOPHAGOGASTRODUODENOSCOPY (EGD) WITH PROPOFOL N/A 01/06/2017   Procedure: ESOPHAGOGASTRODUODENOSCOPY (EGD) WITH PROPOFOL;  Surgeon: Otis Brace, MD;   Location: [place];  Service: Gastroenterology;  Laterality: N/A;  . INCISION AND DRAINAGE ABSCESS     abdominal  . PERIPHERAL VASCULAR CATHETERIZATION N/A 03/07/2016   Procedure: Venogram;  Surgeon: Elam Dutch, MD;  Location: [place] [facility];  Service: Cardiovascular;  Laterality: N/A;  . PERIPHERAL VASCULAR INTERVENTION Left 04/15/2017   Procedure: Peripheral Vascular Intervention;  Surgeon: Waynetta Sandy, MD;  Location: [place] [facility];  Service: Cardiovascular;  Laterality: Left;  . REVISON OF ARTERIOVENOUS FISTULA Right 04/20/2013   Procedure: INSERTION OF ARTERIOVENOUS GORTEX GRAFT;  Surgeon: Elam Dutch, MD;  Location: [place];  Service: Vascular;  Laterality: Right;  Ultrasound Guided  . TONSILLECTOMY         Home Medications    Prior to Admission medications   Medication Sig Start Date End Date Taking? Authorizing Provider  albuterol (PROVENTIL HFA;VENTOLIN HFA) 108 (90 Base) MCG/ACT inhaler Inhale 2 puffs into the lungs every 6 (six) hours as needed for wheezing or shortness of breath.   Yes [provider]  calcium acetate (PHOSLO) 667 MG capsule Take 1,334-2,668 mg by mouth See admin instructions. Take 1334 mg by mouth three times a day with meals and 2,668 mg (also) with each snack   Yes [provider]  darunavir (PREZISTA) 800 MG tablet TAKE 1 TABLET BY MOUTH AT BEDTIME WITH NORVIR Patient taking differently: Take 800 mg by mouth at bedtime.  03/12/17  Yes Michel Bickers, MD  dolutegravir (TIVICAY) 50 MG tablet Take 1 tablet (50 mg total) by mouth at bedtime. 03/12/17  Yes Michel Bickers, MD  Multiple Vitamins-Minerals (MULTIVITAMIN PO) Take 1 tablet by mouth daily.   Yes [provider]  ritonavir (NORVIR) 100 MG TABS tablet TAKE 1 TABLET BY MOUTH AT BEDTIME. TAKE WITH PREZISTA TABLET Patient taking differently: Take 100 mg by mouth at bedtime.  03/12/17  Yes Michel Bickers, MD  tenofovir (VIREAD) 300 MG tablet TAKE 1  TABLET BY MOUTH ONCE A WEEK ON SUNDAYS AT BEDTIME Patient taking differently: Take 300 mg by mouth every Sunday.  03/12/17  Yes Michel Bickers, MD  valACYclovir (VALTREX) 500 MG tablet Take 1 tablet (500 mg total) by mouth daily as needed (herpes outbreaks). 09/01/16  Yes Michel Bickers, MD  docusate sodium (COLACE) 100 MG capsule Take 1 capsule (100 mg total) by mouth daily. Patient not taking: Reported on 04/15/2017 12/10/16   Eloise Levels, MD  pantoprazole (PROTONIX) 40 MG tablet TAKE 1 TABLET BY MOUTH TWICE DAILY Patient not taking: Reported on 08/17/2017 01/08/17   Mercy Riding, MD    Family History Family History  Problem Relation Age of Onset  . Diabetes Mother   . Arthritis Father        Gout- Foot    Social History Social History   Tobacco Use  . Smoking status: Heavy Tobacco Smoker    Packs/day: 0.50    Years: 27.00    Pack years: 13.50    Types: Cigarettes  . Smokeless tobacco:  Never Used  Substance Use Topics  . Alcohol use: No    Alcohol/week: 0.0 oz    Comment: 03/04/2013 "stopped drinking ~ 12 yr ago; never had problem w/it".    . Drug use: No     Allergies   Penicillins; Contrast media [iodinated diagnostic agents]; Acetaminophen-codeine; Oxycodone-acetaminophen; and Tape   Review of Systems Review of Systems  Constitutional: Negative for chills, diaphoresis and fatigue.  HENT: Negative for congestion.   Eyes: Negative for photophobia, pain and visual disturbance.  Respiratory: Negative for cough, chest tightness, shortness of breath, wheezing and stridor.   Cardiovascular: Positive for chest pain. Negative for palpitations and leg swelling.  Gastrointestinal: Negative for constipation, diarrhea, nausea and vomiting.  Genitourinary: Negative for dysuria and flank pain.  Musculoskeletal: Positive for back pain and neck pain. Negative for neck stiffness.  Skin: Positive for rash.  Neurological: Negative for light-headedness and headaches.    Psychiatric/Behavioral: Negative for agitation and confusion.  All other systems reviewed and are negative.    Physical Exam Updated Vital Signs BP 127/74 (BP Location: Right Arm)   Pulse 65   Resp 18   Ht _0  (1.803 m)   Wt 70.3 kg (155 lb)   SpO2 100%   BMI 21.62 kg/m   Physical Exam  Constitutional: He is oriented to person, place, and time. He appears well-developed and well-nourished. No distress.  HENT:  Head: Normocephalic.  Mouth/Throat: Oropharynx is clear and moist.  Eyes: Conjunctivae and EOM are normal. Pupils are equal, round, and reactive to light.  Neck: Normal range of motion.  Cardiovascular: Normal rate and intact distal pulses.  No murmur heard. Pulmonary/Chest: Effort normal and breath sounds normal. No respiratory distress. He has no wheezes. He has no rales.  Abdominal: Soft. Bowel sounds are normal. He exhibits no distension. There is no tenderness.  Musculoskeletal: He exhibits tenderness. He exhibits no edema.  Neurological: He is alert and oriented to person, place, and time. No sensory deficit. He exhibits normal muscle tone.  Skin: Capillary refill takes less than 2 seconds. Rash noted. Rash is vesicular. He is not diaphoretic. No erythema. No pallor.     Psychiatric: He has a normal mood and affect.  Nursing note and vitals reviewed.           ED Treatments / Results  Labs (all labs ordered are listed, but only abnormal results are displayed) Labs Reviewed  CBC WITH DIFFERENTIAL/PLATELET - Abnormal; Notable for the following components:      Result Value   WBC 3.6 (*)    RBC 3.22 (*)    Hemoglobin 9.7 (*)    HCT 31.2 (*)    Platelets 65 (*)    Neutro Abs 1.3 (*)    All other components within normal limits  COMPREHENSIVE METABOLIC PANEL - Abnormal; Notable for the following components:   Creatinine, Ser 8.94 (*)    Albumin 3.0 (*)    ALT 12 (*)    Alkaline Phosphatase 304 (*)    Total Bilirubin 1.7 (*)    GFR calc non Af  Amer 6 (*)    GFR calc Af Amer 7 (*)    All other components within normal limits  I-STAT CG4 LACTIC ACID, ED - Abnormal; Notable for the following components:   Lactic Acid, Venous 1.93 (*)    All other components within normal limits  CBC  CREATININE, SERUM  I-STAT CG4 LACTIC ACID, ED    EKG  EKG Interpretation None  Radiology No results found.  Procedures Procedures (including critical care time)  Medications Ordered in ED Medications  calcium acetate (PHOSLO) capsule [phone] mg (not administered)  darunavir (PREZISTA) tablet 800 mg (not administered)  dolutegravir (TIVICAY) tablet 50 mg (not administered)  ritonavir (NORVIR) tablet 100 mg (not administered)  heparin injection 5,000 Units (not administered)  polyethylene glycol (MIRALAX / GLYCOLAX) packet 17 g (not administered)  HYDROmorphone (DILAUDID) injection 1 mg (not administered)  ondansetron (ZOFRAN) tablet 4 mg (not administered)    Or  ondansetron (ZOFRAN) injection 4 mg (not administered)  predniSONE (DELTASONE) tablet 40 mg (not administered)  acyclovir (ZOVIRAX) 350 mg in dextrose 5 % 100 mL IVPB (not administered)  pentafluoroprop-tetrafluoroeth (GEBAUERS) aerosol 1 application (not administered)  lidocaine (PF) (XYLOCAINE) 1 % injection 5 mL (not administered)  lidocaine-prilocaine (EMLA) cream 1 application (not administered)  0.9 %  sodium chloride infusion (not administered)  0.9 %  sodium chloride infusion (not administered)  heparin injection 1,000 Units (not administered)  alteplase (CATHFLO ACTIVASE) injection 2 mg (not administered)  morphine 4 MG/ML injection 4 mg (4 mg Intravenous Given 10/08/17 0806)  acyclovir (ZOVIRAX) 350 mg in dextrose 5 % 100 mL IVPB (0 mg/kg  70.3 kg Intravenous Stopped 10/08/17 0925)  diphenhydrAMINE (BENADRYL) capsule 50 mg (50 mg Oral Given 10/08/17 1002)  morphine 4 MG/ML injection 4 mg (4 mg Intravenous Given 10/08/17 1002)     Initial Impression /  Assessment and Plan / ED Course  I have reviewed the triage vital signs and the nursing notes.  Pertinent labs & imaging results that were available during my care of the patient were reviewed by me and considered in my medical decision making (see chart for details).     [name] is a 52 y.o. male with a past medical history significant for HIV, ESRD on dialysis T/T/S who presents for painful rash.  Patient reports that over the last 3 days, he has developed a rash on his left torso on the chest, back, shoulder, and left neck.  He describes it as extremely painful with a sharp and burning pain.  It is 10 out of 10 in severity.  He reports that he thought it looks similar to prior shingles outbreaks he has had and began taking his Valtrex.  He says that despite taking the medications, it is continued to worsen and spread to going up his neck.  He denies any rash on his face, any new vision problems, any fevers, chills, shortness of breath, congestion, cough, or GI symptoms.  He reports his pain is uncontrolled and he is concerned about the worsening spread.  On my exam, patient does have a vesicular rash covering multiple dermatomes including his back, left shoulder, and left chest.  It does extend up his neck but I did not see any evidence of rash on his face, and his ear, or in his nose.  Patient has normal extraocular movements and reactive pupils bilaterally.  Patient had clear lungs and abdomen was nontender.  No focal neurologic deficits seen.  Patient had symmetric pulses and symmetric grip strength in upper extremities.  Based on the distribution of the patient's rash, I am concerned about disseminated herpes zoster.  After review of dermatome map, it appears patient has rash and at least dermatomes C3-C8.   As his rash is spreading despite taking the valacyclovir, and his pain is out of control, family medicine team will be called for inpatient management for IV antiviral medication  and pain  management.  Patient is also followed by infectious disease and may need consultation if this does not improve.   Final Clinical Impressions(s) / ED Diagnoses   Final diagnoses:  Herpes zoster without complication   Clinical Impression: 1. Herpes zoster without complication     Disposition: Admit  This note was prepared with assistance of Systems analyst. Occasional wrong-word or sound-a-like substitutions may have occurred due to the inherent limitations of voice recognition software.     Kaynen Minner, Gwenyth Allegra, MD 10/08/17 [phone]

## 2017-10-08 NOTE — ED Notes (Signed)
Called lab for someone to come draw pts labs. Will send someone up.

## 2017-10-08 NOTE — ED Notes (Signed)
Phlebotomy here to draw labs. 

## 2017-10-08 NOTE — ED Notes (Signed)
Unable to get temp at this time. °

## 2017-10-08 NOTE — ED Notes (Signed)
Pt c/o 10/10 pain.

## 2017-10-08 NOTE — ED Notes (Signed)
Spoke with charge Rn about pt not being able to go to his assigned room due to airborne precautions and floor with not accept pt with that. MD has been paged again about this. No negative pressure rooms available in the hospital. Will wait for further instructions.

## 2017-10-08 NOTE — H&P (Signed)
[facility] Admission History and Physical Service Pager: [phone]  Patient name: [name] Medical record number: [id number] Date of birth: 28-Jan-1966 Age: 52 y.o. Gender: male  Primary Care Provider: Mercy Riding, MD Consultants: Nephrology Code Status: Full  Chief Complaint:  Rash and pain  Assessment and Plan: [name] is a 52 y.o. male with a past medical history significant for HIV, ESRD on dialysis (T,Th, Sat), GERD presenting with left torso, back and neck vesicular rash and pain in the C3-C8 distribution consistent with herpes zoster.  #Vesicular rash left torso, back and neck, acute, worsening Patient is a 52 year old male immunocompromised with HIV who presents today with vesicular rash C3-C8 distribution for the past 2 days accompanied with moderate/severe pain. Rash has been spreading despite Valtrex.  On exam, widespread vesicular rash including left torso, upper back and neck.  There is also some erythema noted around vesicles.  No fluid drainage noted.  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  Pain is slightly improved with IV medications. Will admit patient for pain control as well as IV antiviral. --Admit to FMTS, admitting physician Dr. Andria Frames --Airbone and contact precautions --Continue IV acyclovir 5 mg/kg every 24 (ESRD) --Start patient on prednisone 40 mg daily  --Start patient on Dilaudid 1 mg q3 as needed --Consider transitioning to fentanyl --Could consider ID consult given immunosuppression and HIV --Zofran for nausea --Follow-up on a.m. BMP and CBC  #Lactic acidosis Patient with a mild lactic acidosis of 1.93 on admission.  No signs of systemic infection such as pneumonia.  Could be secondary to volume depletion.  Could consider gentle IV fluid and patient is an ESRD patient.  Discussed with  nephrology as needed.  #ESRD on dialysis (T, Th, Sa) Patient is currently on dialysis and has not missed any session.  Does not appear volume overloaded on exam.  Electrolytes all within normal limits.  Case was discussed with Dr. Jonnie Finner and nephrology will be following. --Continue PhosLo as prescribed  #HIV  Patient last CD4 count was 780 on 12/18 with an undetectable viral load.  HIV seems to be well controlled.  Patient is followed by Dr. Megan Salon from infectious disease and was last seen in clinic on 12/18.  We will continue home regimen. --Continue Darunavir, Dolutegravir, Ritonavir --Patient takes Ritonavir on Sunday   #Anemia/Thrombocytopenia, chronic On admission hemoglobin is 9.7.  Baseline seems to be around 9-10 and seems to be consistent with ESRD patient has a history of GI bleed.  And was recently seen by gastroenterology in April 2018.  Patient denies any blood in his stool or melena.  Platelets on admission were 65 with no signs of bleeding.  Thrombocytopenia seems to be chronic. --Could consider FOBT if concern for GI bleed  --Follow-up on CBC  #GERD Appears to be well controlled.  Patient is not taking pantoprazole, could restart as needed.  #Tobacco use Consider nicotine patch as needed  FEN/GI: Renal Diet Prophylaxis: Heparin (ESRD  Disposition: Home pending clinical improvement  History of Present Illness:  [name] is a 52 y.o. male with a past medical history significant for HIV, ESRD on dialysis (Tuesday Thursday Saturday schedule), GERD who presents today for left torso back and neck rash and pain.  Patient reports that rash started on Tuesday, he noticed some vesicles on his left torso accompanied with some erythema and pain.  Patient took his Valtrex  assuming that he was having an HSV outbreak.  Patient states that the rash and pain continue to worsen despite antiviral.  Patient has used ibuprofen for pain control with no change in severity.   Patient also reports using some calamine lotion which did not improve his symptoms.  Patient reports that pain continued to worsen which he rated 10/10 at the time.  Given worsening pain patient decided to come to the ED for further evaluation.  Patient denies any chest pain, shortness of breath, abdominal pain, nausea, vomiting, headache, visual changes. In the ED, patient was given morphine for pain control and started on IV acyclovir after exam findings were consistent with zoster outbreak.  Family medicine was consulted for admission. Patient Of note, patient is on dialysis and denies missing any session.  Review Of Systems: Per HPI  Review of Systems  Constitutional: Negative.   HENT: Negative.   Eyes: Negative.   Respiratory: Negative.   Cardiovascular: Positive for chest pain.  Gastrointestinal: Negative.   Genitourinary: Negative.   Musculoskeletal: Positive for back pain and neck pain.  Skin: Positive for rash.       pain  Neurological: Negative.   Endo/Heme/Allergies: Negative.   Psychiatric/Behavioral: Negative.     Patient Active Problem List   Diagnosis Date Noted  . Nonrheumatic aortic valve stenosis   . PUD (peptic ulcer disease)   . Anemia 01/01/2017  . Toe ulcer ([place])   . GI bleed 12/07/2016  . Gastroesophageal reflux disease 09/13/2016  . Annual physical exam 09/13/2016  . Normocytic anemia 10/14/2015  . Thrombocytopenia ([place]) 10/14/2015  . Gall stone   . 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 ESRD (end stage renal disease) on dialysis 01/02/2012  . PERIPHERAL NEUROPATHY 04/10/2009  . BACTEREMIA, MYCOBACTERIUM AVIUM COMPLEX 10/19/2006  . HIV disease ([place]) 10/19/2006  . HERPES, GENITAL NEC 10/19/2006  . WARTS, OTHER SPECIFIED VIRAL 10/19/2006  . SYPHILIS NOS 10/19/2006  . CIGARETTE SMOKER 10/19/2006  . Depression 10/19/2006    Past Medical History: Past Medical History:  Diagnosis Date  . Anal condyloma   . Anemia   . Diarrhea     occurs because of dialysis  . DJD (degenerative joint disease)    right knee.  All joints  . ESRD (end stage renal disease) on dialysis ([place]) 01/02/2012   Gets diaysis at [facility] on [address], TTS schedule.  Started dialysis in 2000.     . Family history of disseminated HSV infection   . Genital herpes   . Hemodialysis patient [facility])    Tuesday, Thursday, Saturday  . History of blood transfusion   . HIV infection ([place])   . Hypertension   . Pneumonia 1/20107   hx of  . Shortness of breath    "can happen at anytime" (03/04/2013).  03/14/16- "when I have too much fluid"  . Tertiary syphilis   . Thrombosis of dialysis vascular access ([place]) 01/02/2012   RUA AV fistula clotted on 02/27/13, declotted by IR. Reclotted on 03/02/13 and IR placed L IJ tunneled HD catheter (a right external jugular tunneled HD cath was attempted first but did not function due to kinking across the clavicle). Pt was seen by VVS on same admission 6/614 and said RUA AVF no longer usable, they ordered vein mapping and will see him as outpatient to set up next permanent access.       Past Surgical History: Past Surgical History:  Procedure Laterality Date  . A/V SHUNTOGRAM Left 04/15/2017  Procedure: A/V Shuntogram;  Surgeon: Waynetta Sandy, MD;  Location: [place] [facility];  Service: Cardiovascular;  Laterality: Left;  . APPENDECTOMY    . AV FISTULA PLACEMENT Right 06/16/11  . AV FISTULA PLACEMENT Left 03/17/2016   Procedure: INSERTION OF ARTERIOVENOUS (AV) GORE-TEX GRAFT ARM USING GORETEX 4-7MM X 45CM STRETCH GRAFT;  Surgeon: Elam Dutch, MD;  Location: [place];  Service: Vascular;  Laterality: Left;  . AV FISTULA PLACEMENT, BRACHIOCEPHALIC Right 34/28/76  . ESOPHAGOGASTRODUODENOSCOPY N/A 12/09/2016   Procedure: ESOPHAGOGASTRODUODENOSCOPY (EGD);  Surgeon: Otis Brace, MD;  Location: [place];  Service: Gastroenterology;  Laterality: N/A;  . ESOPHAGOGASTRODUODENOSCOPY N/A 01/05/2017   Procedure:  ESOPHAGOGASTRODUODENOSCOPY (EGD);  Surgeon: Clarene Essex, MD;  Location: [facility];  Service: Endoscopy;  Laterality: N/A;  . ESOPHAGOGASTRODUODENOSCOPY (EGD) WITH PROPOFOL N/A 01/06/2017   Procedure: ESOPHAGOGASTRODUODENOSCOPY (EGD) WITH PROPOFOL;  Surgeon: Otis Brace, MD;  Location: [place];  Service: Gastroenterology;  Laterality: N/A;  . INCISION AND DRAINAGE ABSCESS     abdominal  . PERIPHERAL VASCULAR CATHETERIZATION N/A 03/07/2016   Procedure: Venogram;  Surgeon: Elam Dutch, MD;  Location: [place] [facility];  Service: Cardiovascular;  Laterality: N/A;  . PERIPHERAL VASCULAR INTERVENTION Left 04/15/2017   Procedure: Peripheral Vascular Intervention;  Surgeon: Waynetta Sandy, MD;  Location: [place] [facility];  Service: Cardiovascular;  Laterality: Left;  . REVISON OF ARTERIOVENOUS FISTULA Right 04/20/2013   Procedure: INSERTION OF ARTERIOVENOUS GORTEX GRAFT;  Surgeon: Elam Dutch, MD;  Location: [place];  Service: Vascular;  Laterality: Right;  Ultrasound Guided  . TONSILLECTOMY      Social History: Social History   Tobacco Use  . Smoking status: Heavy Tobacco Smoker    Packs/day: 0.50    Years: 27.00    Pack years: 13.50    Types: Cigarettes  . Smokeless tobacco: Never Used  Substance Use Topics  . Alcohol use: No    Alcohol/week: 0.0 oz    Comment: 03/04/2013 "stopped drinking ~ 12 yr ago; never had problem w/it".    . Drug use: No   Additional social history:  Please also refer to relevant sections of EMR.  Family History: Family History  Problem Relation Age of Onset  . Diabetes Mother   . Arthritis Father        Gout- Foot   (If not completed, MUST add something in)  Allergies and Medications: Allergies  Allergen Reactions  . Penicillins Hives and Other (See Comments)    Rx required Hospitalization Has patient had a PCN reaction causing immediate rash, facial/tongue/throat swelling, SOB or lightheadedness with hypotension: Yes Has  patient had a PCN reaction causing severe rash involving mucus membranes or skin necrosis: No Has patient had a PCN reaction that required hospitalization Yes Has patient had a PCN reaction occurring within the last 10 years: No   . Contrast Media [Iodinated Diagnostic Agents] Hives and Itching  . Acetaminophen-Codeine Nausea And Vomiting  . Oxycodone-Acetaminophen Other (See Comments)    Reaction unknown  . Tape Rash and Other (See Comments)    Use paper tape only (NO CLEAR PLASTIC TAPE!!)   No current facility-administered medications on file prior to encounter.    Current Outpatient Medications on File Prior to Encounter  Medication Sig Dispense Refill  . albuterol (PROVENTIL HFA;VENTOLIN HFA) 108 (90 Base) MCG/ACT inhaler Inhale 2 puffs into the lungs every 6 (six) hours as needed for wheezing or shortness of breath.    . calcium acetate (PHOSLO) 667 MG capsule Take  5,916-3,846 mg by mouth See admin instructions. Take 1334 mg by mouth three times a day with meals and 2,668 mg (also) with each snack    . darunavir (PREZISTA) 800 MG tablet TAKE 1 TABLET BY MOUTH AT BEDTIME WITH NORVIR (Patient taking differently: Take 800 mg by mouth at bedtime. ) 90 tablet 1  . dolutegravir (TIVICAY) 50 MG tablet Take 1 tablet (50 mg total) by mouth at bedtime. 90 tablet 1  . Multiple Vitamins-Minerals (MULTIVITAMIN PO) Take 1 tablet by mouth daily.    . ritonavir (NORVIR) 100 MG TABS tablet TAKE 1 TABLET BY MOUTH AT BEDTIME. TAKE WITH PREZISTA TABLET (Patient taking differently: Take 100 mg by mouth at bedtime. ) 90 tablet 1  . tenofovir (VIREAD) 300 MG tablet TAKE 1 TABLET BY MOUTH ONCE A WEEK ON SUNDAYS AT BEDTIME (Patient taking differently: Take 300 mg by mouth every Sunday. ) 12 tablet 1  . valACYclovir (VALTREX) 500 MG tablet Take 1 tablet (500 mg total) by mouth daily as needed (herpes outbreaks). 30 tablet 11  . docusate sodium (COLACE) 100 MG capsule Take 1 capsule (100 mg total) by mouth daily.  (Patient not taking: Reported on 04/15/2017) 10 capsule 0  . pantoprazole (PROTONIX) 40 MG tablet TAKE 1 TABLET BY MOUTH TWICE DAILY (Patient not taking: Reported on 08/17/2017) 180 tablet 0    Objective: BP 119/76   Pulse 65   Resp 18   Ht _0  (1.803 m)   Wt 155 lb (70.3 kg)   SpO2 100%   BMI 21.62 kg/m            Physical Exam: General: NAD, pleasant, able to participate in exam Cardiac: RRR, normal heart sounds, no murmurs. 2+ radial and PT pulses bilaterally Respiratory: CTAB, normal effort, No wheezes, rales or rhonchi Abdomen: soft, nontender, nondistended, no hepatic or splenomegaly, +BS Extremities: no edema or cyanosis. WWP. Skin: Diffused vesicular rash with surrounding erythema on left torso upper back and neck in the C3-C8 distribution.  Patient is tender with gentle palpation. Neuro: alert and oriented x4, no focal deficits Psych: Normal affect and mood  Labs and Imaging: CBC BMET  Recent Labs  Lab 10/08/17 0801  WBC 3.6*  HGB 9.7*  HCT 31.2*  PLT 65*   Recent Labs  Lab 10/08/17 0801  NA 141  K 4.5  CL 101  CO2 27  BUN 17  CREATININE 8.94*  GLUCOSE 77  CALCIUM 9.3     LA:1.93  Marjie Skiff, MD 10/08/2017, 10:37 AM PGY-2, [place] Intern pager: [phone], text pages welcome

## 2017-10-08 NOTE — ED Notes (Signed)
Contacted Dr. Frances FurbishWinfrey regarding airborne & contact precautions for patient. [facility] unable to take patient with airborne precautions. Awaiting new orders.

## 2017-10-08 NOTE — ED Notes (Addendum)
Pts wife to desk to complain that noone has addressed her husbands pain since he has been up here. Wife states that she no one gave pt a gown, or socks, and no one gave pt dinner. Wife very angry at the desk, cursing at both RNs.  Kendal HymenBonnie, RN explained that she has been trying to get into the room to see him with pain medication, but has just had so much going on up here with other patients. Both Rn's apologized to patient and wife. Explained that [facility] is not a regular floor and that it is staffed with ER nurses and have been short today with no tech and no phlebotomy. Will go see patient at once with pain medication.

## 2017-10-09 DIAGNOSIS — Z992 Dependence on renal dialysis: Secondary | ICD-10-CM | POA: Diagnosis not present

## 2017-10-09 DIAGNOSIS — B20 Human immunodeficiency virus [HIV] disease: Secondary | ICD-10-CM | POA: Diagnosis not present

## 2017-10-09 DIAGNOSIS — N186 End stage renal disease: Secondary | ICD-10-CM | POA: Diagnosis not present

## 2017-10-09 DIAGNOSIS — B029 Zoster without complications: Secondary | ICD-10-CM | POA: Diagnosis not present

## 2017-10-09 MED ORDER — DOXERCALCIFEROL 4 MCG/2ML IV SOLN
INTRAVENOUS | Status: AC
  Filled 2017-10-09: qty 2

## 2017-10-09 MED ORDER — GABAPENTIN 100 MG PO CAPS: 100.0000 mg | ORAL_CAPSULE | Freq: Three times a day (TID) | ORAL | 0 refills | Status: DC

## 2017-10-09 MED ORDER — OXYCODONE-ACETAMINOPHEN 5-325 MG PO TABS: 1.0000 | ORAL_TABLET | Freq: Four times a day (QID) | ORAL | 0 refills | Status: AC | PRN

## 2017-10-09 MED ORDER — OXYCODONE-ACETAMINOPHEN 5-325 MG PO TABS: 1.0000 | ORAL_TABLET | Freq: Four times a day (QID) | ORAL | Status: DC | PRN

## 2017-10-09 MED ORDER — GABAPENTIN 100 MG PO CAPS: 100.0000 mg | ORAL_CAPSULE | Freq: Three times a day (TID) | ORAL | Status: DC

## 2017-10-09 MED ORDER — DOXERCALCIFEROL 4 MCG/2ML IV SOLN: 4.0000 ug | INTRAVENOUS | Status: DC

## 2017-10-09 MED ORDER — DOXERCALCIFEROL 4 MCG/2ML IV SOLN
4.0000 ug | INTRAVENOUS | Status: AC
  Administered 2017-10-09: 4 ug via INTRAVENOUS

## 2017-10-09 MED ORDER — ACYCLOVIR 800 MG PO TABS: 800.0000 mg | ORAL_TABLET | Freq: Two times a day (BID) | ORAL | 0 refills | Status: DC

## 2017-10-09 MED ORDER — ACYCLOVIR 800 MG PO TABS
800.0000 mg | ORAL_TABLET | Freq: Two times a day (BID) | ORAL | Status: DC
  Filled 2017-10-09 (×2): qty 1

## 2017-10-09 MED ORDER — PREDNISONE 20 MG PO TABS: 40.0000 mg | ORAL_TABLET | Freq: Every day | ORAL | 0 refills | Status: AC

## 2017-10-09 NOTE — Progress Notes (Signed)
Arrival Method: Patient arrived in stretcher from ED. Mental Orientation: alert and oriented Telemetry:no Assessment: See Doc Flow sheets. Skin: Warm, dry and intact. IV: Peripheral   Right forearm  Pain:.received dilaudid Fall Prevention Safety Plan: Patient educated about fall prevention safety plan, understood and acknowledged. Admission Screening: 5100 Orientation: Patient has been oriented to the unit, staff and to the room.

## 2017-10-09 NOTE — Progress Notes (Signed)
Family Medicine Teaching Service Daily Progress Note Intern Pager: [phone]  Patient name: [name] Medical record number: [id number] Date of birth: [birth year] Age: 52 y.o. Gender: male  Primary Care Provider: Almon Hercules, MD Consultants: none Code Status: full  Pt Overview and Major Events to Date:  1/10 admitted to fpts, nephrology consulted 1/11 Hemodialysis  Assessment and Plan: [name] is a 52 y.o. male with a past medical history significant for HIV, ESRD on dialysis (T,Th, Sat), GERD presenting with left torso, back and neck vesicular rash and pain in the C3-C8 distribution consistent with herpes zoster.  #Vesicular rash 52 year old immunocompromised male who presents with vesicular rash for 2 previous days. Was on valtrex but rash continued to spread. Some concern for disseminated HSV given immunocompromised status and no improvement as outpatient on valtrex. CD 4 count T Cell absolute count 780. 36% helper T cell percentatge. Patient perhaps not as immunocompromised as initially described in H&P. Rash does appear to be improving on IV acyclovir. Can likely switch to PO acyclovir 1/11. Will continue prednisone for post-herpetic neuralgia. Will switch from IV dilaudid to percocet and gabapentin. Likely D/C after dialysis 1/11. - vital signs per floor routien - contact precautions - will switch to PO 800mg  BID acyclovir per pharmacy recommendations (to complete 7 days) - prednisone 40mg  daily - d/c dilaudid - start gabapentin and switch to percocet 5mg  - zofran for nausea  #ESRD on dialysis (T, Th, Sa) Patient is currently on dialysis and has not missed any session.  Does not appear volume overloaded on exam.  Electrolytes all within normal limits.  HD planned for 1/11 - Continue PhosLo as prescribed - HD per Nephro  #HIV  Patient last CD4 count was 780 on 12/18 with an undetectable viral load.  HIV seems to be well controlled.  Patient is followed  by Dr. Orvan Falconer from infectious disease and was last seen in clinic on 12/18.  We will continue home regimen. --Continue Darunavir, Dolutegravir, Ritonavir --Patient takes Ritonavir on Sunday   #Anemia/Thrombocytopenia, chronic On admission hemoglobin is 9.7. Up to 10.3 on 1/11. Plt 57. Wbc 2.8.  Consistent with BM suppression commonly seen in HIV. Has history of GI bleed. No indication for GI workup for GI bleeding at this time. - daily cbc as indicated  #GERD Appears to be well controlled.  Patient is not taking pantoprazole, could restart as needed.  #Tobacco use Consider nicotine patch as needed  #Lactic acidosis Patient with a mild lactic acidosis of 1.93 on admission.  No signs of systemic infection such as pneumonia. No longer trending. Will consider resolved given improvement.   FEN/GI: renal with fluid restriction PPx: heparin 5000U  Disposition: likely home  Subjective:  Patient very sleep from pain medications this morning. No acute distress, not complaining of any pain.  Objective: Temp:  [98.6 F (37 C)] 98.6 F (37 C) (01/11 0427) Pulse Rate:  [70-72] 70 (01/11 0427) Resp:  [14-16] 16 (01/11 0427) BP: (124-156)/(71-94) 130/71 (01/11 0427) SpO2:  [97 %-100 %] 100 % (01/11 0427) Weight:  [154 lb 12.2 oz (70.2 kg)] 154 lb 12.2 oz (70.2 kg) (01/11 0058) Physical Exam: General: very sleepy, african [place] male, resting comfortably Cardiovascular: rrr, normal apical impulse, palpable radial artery, LUE AV graft noted Respiratory: lungs clear to auscultation bilaterally, no chest pain Abdomen: soft, non-tender, non-distended Extremities: able to move all extremities, exam limited by sleepiness Derm: improving vesicular skin lesions in dermatomal distribution LUE  Laboratory: Recent Labs  Lab 10/08/17 0801 10/08/17 2240  WBC 3.6* 2.8*  HGB 9.7* 10.3*  HCT 31.2* 32.7*  PLT 65* 57*   Recent Labs  Lab 10/08/17 0801 10/08/17 2240  NA 141  --   K 4.5  --    CL 101  --   CO2 27  --   BUN 17  --   CREATININE 8.94* 9.57*  CALCIUM 9.3  --   PROT 7.2  --   BILITOT 1.7*  --   ALKPHOS 304*  --   ALT 12*  --   AST 15  --   GLUCOSE 77  --    Imaging/Diagnostic Tests:   Myrene BuddyFletcher, Tytus Strahle, MD 10/09/2017, 9:23 AM PGY-1, [place] [facility] FPTS Intern pager: [phone], text pages welcome

## 2017-10-09 NOTE — Discharge Instructions (Signed)
You were admitted for having herpes zoster virus also known as shingles.  We have given you medications to help ease the pain.  He also received dialysis while in the hospital.  Follow-up with your infectious disease for your HIV, HD for your nephrologist.  I have scheduled you to follow-up with our clinic.  We have given you 7 days worth of steroids, acyclovir (antiviral), and gabapentin (nerve pain).  In addition, we have given you some Percocet for breakthrough pain.

## 2017-10-09 NOTE — Progress Notes (Signed)
10/09/17  19:20 Patient discharged home accompanied by wife.Discharge instruction and papers given by day RN.Belongings with patient. Salimatou Simone, Drinda Buttsharito Joselita, RCharity fundraiser

## 2017-10-09 NOTE — Care Management Note (Signed)
Case Management Note  Patient Details  Name: [name] MRN: [id number] Date of Birth: [birth year]  Subjective/Objective:                 Immunocompromised patent coming from home with wife. Independent, compliant with 042 treatment. Admitted w herpes zoster rash. CM will continue to follow, no needs identified at this time.    Action/Plan:   Expected Discharge Date:  10/12/17               Expected Discharge Plan:  Home/Self Care  In-House Referral:     Discharge planning Services  CM Consult  Post Acute Care Choice:    Choice offered to:     DME Arranged:    DME Agency:     HH Arranged:    HH Agency:     Status of Service:  In process, will continue to follow  If discussed at Long Length of Stay Meetings, dates discussed:    Additional Comments:  Lawerance SabalDebbie Travas Schexnayder, RN 10/09/2017, 2:17 PM

## 2017-10-09 NOTE — Progress Notes (Signed)
Subjective:  He  Today Off schedule refused yest/ "feel doped up with this med"  Objective Vital signs in last 24 hours: Vitals:   10/08/17 1705 10/09/17 0058 10/09/17 0427 10/09/17 0943  BP: (!) 156/86 (!) 149/83 130/71 (!) 148/76  Pulse: 72 71 70 66  Resp: 14 15 16 15   Temp:  98.6 F (37 C) 98.6 F (37 C)   TempSrc:  Oral Oral   SpO2: 99% 99% 100% 100%  Weight:  70.2 kg (154 lb 12.2 oz)    Height:  5\' 11"  (1.803 m)     Weight change: -0.107 kg (-3.8 oz)  Physical Exam: General:  adult AAM, Well developed, well nourished, in no acute distress. OX3  Somewhat  Drowsy but answering appropriately   Skin: Erythematous, vesicular rash across upper L chest over should onto top of L back, extends slightly down his L arm but stops prior to his AVG. Lungs: Clear  Bilat. without wheezes, rales, or rhonchi.  Heart: RRR; 2/6 systolic murmur noted no rub or gallop  Abdomen: Soft, non-tender, non-distended with normoactive bowel sounds. No rebound/guarding. No obvious abdominal masses. Lower extremities: 2+ pitting edema in B LE, up to knees. Dialysis Access: LUE AVG + bruit       Recent Labs  Lab 10/08/17 0801  WBC 3.6*  NEUTROABS 1.3*  HGB 9.7*  HCT 31.2*  MCV 96.9  PLT 65*     Dialysis Orders:  TTS at [facility] 3:30hr, 400/800, 2K/2.5Ca, EDW 67.5kg, profile 4/linear Na, no heparin - Hectoral IV q HD - Venofer 50mg  IV weekly  Problem/Plan: 1.  Herpes Zoster (L C3-C8 distribution): Started on renally-dosed Acycylovir, prednisone, and narcotics for pain control. Per primary team. 2.  ESRD: Usually TTS schedule, due for HD today. Refused HD yest. Merdis Delay labs are  [employer] hd today. 3.  Hypertension/volume: BP ok , no dyspnea. Has  Chronic LE edema, will lower EDW. 4.  Anemia: Hgb 9.7> 10.3 yest  follow without ESA for now. On wkly fe on hd  5.  Metabolic bone disease: Ca ok, Phos pending hd labs . Continue VDRA/binders. 6.  HIV: HAART meds, per primary  Lenny Pastel,  PA-C [facility] Beeper [phone] 10/09/2017,9:49 AM  LOS: 1 day   Pt seen, examined and agree w A/P as above.  Vinson Moselle MD [employer] pager [phone]   10/09/2017, 1:34 PM    Labs: Basic Metabolic Panel: Recent Labs  Lab 10/08/17 0801 10/08/17 2240  NA 141  --   K 4.5  --   CL 101  --   CO2 27  --   GLUCOSE 77  --   BUN 17  --   CREATININE 8.94* 9.57*  CALCIUM 9.3  --    Liver Function Tests: Recent Labs  Lab 10/08/17 0801  AST 15  ALT 12*  ALKPHOS 304*  BILITOT 1.7*  PROT 7.2  ALBUMIN 3.0*   No results for input(s): LIPASE, AMYLASE in the last 168 hours. No results for input(s): AMMONIA in the last 168 hours. CBC: Recent Labs  Lab 10/08/17 0801 10/08/17 2240  WBC 3.6* 2.8*  NEUTROABS 1.3*  --   HGB 9.7* 10.3*  HCT 31.2* 32.7*  MCV 96.9 97.3  PLT 65* 57*   Cardiac Enzymes: No results for input(s): CKTOTAL, CKMB, CKMBINDEX, TROPONINI in the last 168 hours. CBG: No results for input(s): GLUCAP in the last 168 hours.  Studies/Results: No results found. Medications: . sodium chloride    .  sodium chloride    . acyclovir     . calcium acetate  2,668 mg Oral TID WC  . darunavir  800 mg Oral QHS  . dolutegravir  50 mg Oral QHS  . heparin  5,000 Units Subcutaneous Q8H  . predniSONE  40 mg Oral Q breakfast  . ritonavir  100 mg Oral QHS

## 2017-10-10 ENCOUNTER — Telehealth: Payer: Self-pay | Admitting: Internal Medicine

## 2017-10-10 MED ORDER — ACYCLOVIR 400 MG PO TABS: ORAL_TABLET | ORAL | 0 refills | Status: AC

## 2017-10-10 NOTE — Telephone Encounter (Signed)
Nephro PA called concerned about dose of Acyclovir patient was discharged home with yesterday, 1/11. Was sent with Acyclovir 800 mg BID x7 days for shingles. Given ESRD, that dose is far too high. Spoke with pharmacist Lafonda Mossesiana who recommended 400 mg after HD sessions over the course of the next week. Patient receives HD T/TH/Sat. Spoke with patient who stated he will not be having HD today since he had it yesterday in hospital. Next HD session scheduled for Tuesday, 1/15. He has taken one tablet of Acyclovir 800 mg since discharge. Recommended that he not take any further medication until HD on Tuesday. Will prescribe 3 tablets of Acyclovir 400 mg to be taken on evenings after HD over the next week. Patient voiced understanding. Discussed return precautions in the interim.   Marcy Sirenatherine Wallace, D.O. 10/10/2017, 9:33 AM PGY-3, [facility]

## 2017-10-12 ENCOUNTER — Inpatient Hospital Stay: Payer: Medicare Other | Admitting: Student

## 2017-10-12 ENCOUNTER — Ambulatory Visit: Payer: Medicare Other

## 2017-10-12 NOTE — Discharge Summary (Signed)
Family Medicine Teaching [facility] Discharge Summary  Patient name: Caleb BaldingJeffrey E Antigua Medical record number: [id number] Date of birth: 11/07/1965 Age: 52 y.o. Gender: male Date of Admission: 10/08/2017  Date of Discharge: 10/09/2017 Admitting Physician: Moses MannersWilliam A Hensel, MD  Primary Care Provider: Almon HerculesGonfa, Taye T, MD Consultants: neprhology  Indication for Hospitalization: Possible disseminated herpes zoster  Discharge Diagnoses/Problem List:  Vesicular rash ESRD on dialysis HIV Anemia Thrombocytopenia GERD Tobacco Use Lactic Acidosis  Disposition: home  Discharge Condition: good  Discharge Exam: General: very sleepy, african [place]americ[place] male, resting comfortably Cardiovascular: rrr, normal apical impulse, palpable radial artery, LUE AV graft noted Respiratory: lungs clear to auscultation bilaterally, no chest pain Abdomen: soft, non-tender, non-distended Extremities: able to move all extremities, exam limited by sleepiness Derm: improving vesicular skin lesions in dermatomal distribution LUE  Brief Hospital Course:  52 year old who presented on 1/10 with new onset vesicular rash of his LUE/Left torso. This was felt t be a shingles outbreak. Given his HIV status he was felt to be immunocompromised and there was concern for disseminated herpes infection. He was admitted for observation.  Shingles Patient was started on renally dosed IV acyclovir. To reduce the intensity for post-herpetic neuralgia he was also started on prednisone 40mg . His pain regimen consited of dilaudid 1mg  q 3 hours. His rash had shown some improvement in the morning of 1/11. His pain was well controlled and he exhibited no s/s consistent with disseminated infection. He was discharged initially on 800mg  bid of oral acyclovir per pharmacy recs. Nephrology suggested 400mg  after HD sessions after patient was discharged. He was switched to this regimen and given strict return precautions in the event he  developed acyclovir crystal nephropathy. He was discharged for a 7 day course of acyclovir. He was also discharged on a 7 day course of prednisone and oral pain medication.  ESRD Patient underwent HD on 1/11.  Issues for Follow Up:  1. Follow up resolution of shingles rash 2. Follow up toleration of acyclovir  Significant Procedures: Hemodialysis  Significant Labs and Imaging:  Recent Labs  Lab 10/08/17 0801 10/08/17 2240  WBC 3.6* 2.8*  HGB 9.7* 10.3*  HCT 31.2* 32.7*  PLT 65* 57*   Recent Labs  Lab 10/08/17 0801 10/08/17 2240  NA 141  --   K 4.5  --   CL 101  --   CO2 27  --   GLUCOSE 77  --   BUN 17  --   CREATININE 8.94* 9.57*  CALCIUM 9.3  --   ALKPHOS 304*  --   AST 15  --   ALT 12*  --   ALBUMIN 3.0*  --     Results/Tests Pending at Time of Discharge:  Discharge Medications:  Allergies as of 10/09/2017      Reactions   Penicillins Hives, Other (See Comments)   Rx required Hospitalization Has patient had a PCN reaction causing immediate rash, facial/tongue/throat swelling, SOB or lightheadedness with hypotension: Yes Has patient had a PCN reaction causing severe rash involving mucus membranes or skin necrosis: No Has patient had a PCN reaction that required hospitalization Yes Has patient had a PCN reaction occurring within the last 10 years: No   Contrast Media [iodinated Diagnostic Agents] Hives, Itching   Acetaminophen-codeine Nausea And Vomiting   Oxycodone-acetaminophen Other (See Comments)   Reaction unknown   Tape Rash, Other (See Comments)   Use paper tape only (NO CLEAR PLASTIC TAPE!!)      Medication List  STOP taking these medications   valACYclovir 500 MG tablet Commonly known as:  VALTREX     TAKE these medications   albuterol 108 (90 Base) MCG/ACT inhaler Commonly known as:  PROVENTIL HFA;VENTOLIN HFA Inhale 2 puffs into the lungs every 6 (six) hours as needed for wheezing or shortness of breath.   calcium acetate 667 MG  capsule Commonly known as:  PHOSLO Take 1,334-2,668 mg by mouth See admin instructions. Take 1334 mg by mouth three times a day with meals and 2,668 mg (also) with each snack   darunavir 800 MG tablet Commonly known as:  PREZISTA TAKE 1 TABLET BY MOUTH AT BEDTIME WITH NORVIR What changed:    how much to take  how to take this  when to take this  additional instructions   docusate sodium 100 MG capsule Commonly known as:  COLACE Take 1 capsule (100 mg total) by mouth daily.   dolutegravir 50 MG tablet Commonly known as:  TIVICAY Take 1 tablet (50 mg total) by mouth at bedtime.   gabapentin 100 MG capsule Commonly known as:  NEURONTIN Take 1 capsule (100 mg total) by mouth 3 (three) times daily for 7 days.   MULTIVITAMIN PO Take 1 tablet by mouth daily.   oxyCODONE-acetaminophen 5-325 MG tablet Commonly known as:  PERCOCET/ROXICET Take 1 tablet by mouth every 6 (six) hours as needed for up to 5 days for severe pain.   pantoprazole 40 MG tablet Commonly known as:  PROTONIX TAKE 1 TABLET BY MOUTH TWICE DAILY   predniSONE 20 MG tablet Commonly known as:  DELTASONE Take 2 tablets (40 mg total) by mouth daily with breakfast for 7 days.   ritonavir 100 MG Tabs tablet Commonly known as:  NORVIR TAKE 1 TABLET BY MOUTH AT BEDTIME. TAKE WITH PREZISTA TABLET What changed:    how much to take  how to take this  when to take this  additional instructions   tenofovir 300 MG tablet Commonly known as:  VIREAD TAKE 1 TABLET BY MOUTH ONCE A WEEK ON SUNDAYS AT BEDTIME What changed:    how much to take  how to take this  when to take this  additional instructions       Discharge Instructions: Please refer to Patient Instructions section of EMR for full details.  Patient was counseled important signs and symptoms that should prompt return to medical care, changes in medications, dietary instructions, activity restrictions, and follow up appointments.   Follow-Up  Appointments: Follow-up Information    Cliffton [name], MD Follow up.   Specialty:  Infectious Diseases Why:  Follow up as scheduled.       Terrial Rhodes, MD Follow up.   Specialty:  Nephrology Why:  Follow up for dailysis and appointments. Contact information: [address] [place] [address] [phone]        [facility]. Go on 10/14/2017.   Why:  Go to appt at 1:30 PM. Please arrive 15 mins early and bring meds. Contact information: [address] [place] [address] [phone]          Myrene Buddy, MD 10/12/2017, 2:33 AM PGY-1, [facility]

## 2017-10-20 ENCOUNTER — Other Ambulatory Visit: Payer: Self-pay

## 2017-10-20 ENCOUNTER — Emergency Department (HOSPITAL_COMMUNITY): Payer: Medicare Other

## 2017-10-20 ENCOUNTER — Emergency Department (HOSPITAL_COMMUNITY)
Admission: EM | Admit: 2017-10-20 | Discharge: 2017-10-21 | Disposition: A | Discharge status: Home / Self Care | Payer: Medicare Other | Attending: Emergency Medicine | Admitting: Emergency Medicine

## 2017-10-20 ENCOUNTER — Encounter (HOSPITAL_COMMUNITY): Payer: Self-pay | Admitting: Emergency Medicine

## 2017-10-20 DIAGNOSIS — N186 End stage renal disease: Secondary | ICD-10-CM | POA: Diagnosis not present

## 2017-10-20 DIAGNOSIS — F17210 Nicotine dependence, cigarettes, uncomplicated: Secondary | ICD-10-CM | POA: Insufficient documentation

## 2017-10-20 DIAGNOSIS — E875 Hyperkalemia: Secondary | ICD-10-CM

## 2017-10-20 DIAGNOSIS — B0229 Other postherpetic nervous system involvement: Secondary | ICD-10-CM | POA: Diagnosis not present

## 2017-10-20 DIAGNOSIS — Z79899 Other long term (current) drug therapy: Secondary | ICD-10-CM | POA: Insufficient documentation

## 2017-10-20 DIAGNOSIS — I120 Hypertensive chronic kidney disease with stage 5 chronic kidney disease or end stage renal disease: Secondary | ICD-10-CM | POA: Diagnosis not present

## 2017-10-20 DIAGNOSIS — Z992 Dependence on renal dialysis: Secondary | ICD-10-CM | POA: Insufficient documentation

## 2017-10-20 DIAGNOSIS — E870 Hyperosmolality and hypernatremia: Secondary | ICD-10-CM | POA: Diagnosis not present

## 2017-10-20 DIAGNOSIS — M542 Cervicalgia: Secondary | ICD-10-CM | POA: Diagnosis present

## 2017-10-20 LAB — CBC WITH DIFFERENTIAL/PLATELET
BASOS PCT: 0 %
Basophils Absolute: 0 10*3/uL (ref 0.0–0.1)
EOS ABS: 0.1 10*3/uL (ref 0.0–0.7)
Eosinophils Relative: 1 %
HEMATOCRIT: 30.5 % — AB (ref 39.0–52.0)
HEMOGLOBIN: 10.3 g/dL — AB (ref 13.0–17.0)
Lymphocytes Relative: 26 %
Lymphs Abs: 2 10*3/uL (ref 0.7–4.0)
MCH: 32.4 pg (ref 26.0–34.0)
MCHC: 33.8 g/dL (ref 30.0–36.0)
MCV: 95.9 fL (ref 78.0–100.0)
MONOS PCT: 9 %
Monocytes Absolute: 0.7 10*3/uL (ref 0.1–1.0)
NEUTROS ABS: 5 10*3/uL (ref 1.7–7.7)
NEUTROS PCT: 64 %
Platelets: 100 10*3/uL — ABNORMAL LOW (ref 150–400)
RBC: 3.18 MIL/uL — ABNORMAL LOW (ref 4.22–5.81)
RDW: 16 % — ABNORMAL HIGH (ref 11.5–15.5)
WBC: 7.7 10*3/uL (ref 4.0–10.5)

## 2017-10-20 LAB — BASIC METABOLIC PANEL
ANION GAP: 18 — AB (ref 5–15)
BUN: 79 mg/dL — ABNORMAL HIGH (ref 6–20)
CHLORIDE: 111 mmol/L (ref 101–111)
CO2: 26 mmol/L (ref 22–32)
Calcium: 8.1 mg/dL — ABNORMAL LOW (ref 8.9–10.3)
Creatinine, Ser: 12.99 mg/dL — ABNORMAL HIGH (ref 0.61–1.24)
GFR calc non Af Amer: 4 mL/min — ABNORMAL LOW (ref >60)
GFR, EST AFRICAN AMERICAN: 5 mL/min — AB (ref >60)
Glucose, Bld: 128 mg/dL — ABNORMAL HIGH (ref 65–99)
Potassium: 5.5 mmol/L — ABNORMAL HIGH (ref 3.5–5.1)
SODIUM: 155 mmol/L — AB (ref 135–145)

## 2017-10-20 LAB — I-STAT TROPONIN, ED: TROPONIN I, POC: 0.03 ng/mL (ref 0.00–0.08)

## 2017-10-20 MED ORDER — HYDROMORPHONE HCL 1 MG/ML IJ SOLN
1.0000 mg | Freq: Once | INTRAMUSCULAR | Status: AC
  Administered 2017-10-20: 1 mg via INTRAMUSCULAR

## 2017-10-20 MED ORDER — HYDROMORPHONE HCL 1 MG/ML IJ SOLN
1.0000 mg | Freq: Once | INTRAMUSCULAR | Status: DC
  Filled 2017-10-20 (×2): qty 1

## 2017-10-20 NOTE — ED Provider Notes (Signed)
Complains of left neck pain and left shoulder pain for the past 2 weeks, diagnosed with shingles a few weeks ago.  He denies fever denies feeling ill.  Denies shortness of breath.  He missed dialysis today as he was getting his car fixed.  He has hemodialysis scheduled for  6 AM tomorrow.  On exam no distress there is a herpetic rash at left neck and over left deltoid area.  No surrounding redness swelling or tenderness   Doug SouJacubowitz, Wister Hoefle, MD 10/20/17 2308

## 2017-10-20 NOTE — ED Provider Notes (Signed)
[facility] EMERGENCY DEPARTMENT Provider Note   CSN: [id number] Arrival date & time: 10/20/17  1953     History   Chief Complaint Chief Complaint  Patient presents with  . Neck Pain    HPI [name] is a 52 y.o. male with history of HIV, ESRD on dialysis (Tuesday Thursday Saturday) who was recently treated and discharged from the hospital for herpes zoster.  Patient has healing rash to left shoulder and left-sided neck that has continued to hurt and cause shooting pains.  He has had associated itching to the area as well.  He is still taking acyclovir and is also taking gabapentin and oxycodone for his pain, which has continued despite medications.  Patient missed dialysis yesterday and plans to go tomorrow, Wednesday.  Patient denies chest pain or shortness of breath, but has noticed leg swelling in the past 2 days.  He denies any fevers.  HPI  Past Medical History:  Diagnosis Date  . Anal condyloma   . Anemia   . Diarrhea    occurs because of dialysis  . DJD (degenerative joint disease)    right knee.  All joints  . ESRD (end stage renal disease) on dialysis ([place]) 01/02/2012   Gets diaysis at [facility] on [address], TTS schedule.  Started dialysis in 2000.     . Family history of disseminated HSV infection   . Genital herpes   . Hemodialysis patient [facility])    Tuesday, Thursday, Saturday  . History of blood transfusion   . HIV infection ([place])   . Hypertension   . Pneumonia 1/20107   hx of  . Shortness of breath    "can happen at anytime" (03/04/2013).  03/14/16- "when I have too much fluid"  . Tertiary syphilis   . Thrombosis of dialysis vascular access ([place]) 01/02/2012   RUA AV fistula clotted on 02/27/13, declotted by IR. Reclotted on 03/02/13 and IR placed L IJ tunneled HD catheter (a right external jugular tunneled HD cath was attempted first but did not function due to kinking across the clavicle). Pt was seen by VVS on same admission 6/614 and  said RUA AVF no longer usable, they ordered vein mapping and will see him as outpatient to set up next permanent access.       Patient Active Problem List   Diagnosis Date Noted  . Herpes zoster 10/08/2017  . HIV (human immunodeficiency virus infection) ([place])   . Nonrheumatic aortic valve stenosis   . PUD (peptic ulcer disease)   . Anemia 01/01/2017  . Toe ulcer ([place])   . GI bleed 12/07/2016  . Gastroesophageal reflux disease 09/13/2016  . Annual physical exam 09/13/2016  . Normocytic anemia 10/14/2015  . Thrombocytopenia ([place]) 10/14/2015  . Gall stone   . 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 End stage renal disease on dialysis ([place]) 01/02/2012  . PERIPHERAL NEUROPATHY 04/10/2009  . BACTEREMIA, MYCOBACTERIUM AVIUM COMPLEX 10/19/2006  . HIV disease ([place]) 10/19/2006  . HERPES, GENITAL NEC 10/19/2006  . WARTS, OTHER SPECIFIED VIRAL 10/19/2006  . SYPHILIS NOS 10/19/2006  . CIGARETTE SMOKER 10/19/2006  . Depression 10/19/2006    Past Surgical History:  Procedure Laterality Date  . A/V SHUNTOGRAM Left 04/15/2017   Procedure: A/V Shuntogram;  Surgeon: Waynetta Sandy, MD;  Location: [facility] CV [facility];  Service: Cardiovascular;  Laterality: Left;  . APPENDECTOMY    . AV FISTULA PLACEMENT Right 06/16/11  . AV FISTULA PLACEMENT Left 03/17/2016  Procedure: INSERTION OF ARTERIOVENOUS (AV) GORE-TEX GRAFT ARM USING GORETEX 4-7MM X 45CM STRETCH GRAFT;  Surgeon: Elam Dutch, MD;  Location: [facility];  Service: Vascular;  Laterality: Left;  . AV FISTULA PLACEMENT, BRACHIOCEPHALIC Right 09/38/18  . ESOPHAGOGASTRODUODENOSCOPY N/A 12/09/2016   Procedure: ESOPHAGOGASTRODUODENOSCOPY (EGD);  Surgeon: Otis Brace, MD;  Location: [facility];  Service: Gastroenterology;  Laterality: N/A;  . ESOPHAGOGASTRODUODENOSCOPY N/A 01/05/2017   Procedure: ESOPHAGOGASTRODUODENOSCOPY (EGD);  Surgeon: Clarene Essex, MD;  Location: [facility] ENDOSCOPY;  Service: Endoscopy;  Laterality: N/A;  .  ESOPHAGOGASTRODUODENOSCOPY (EGD) WITH PROPOFOL N/A 01/06/2017   Procedure: ESOPHAGOGASTRODUODENOSCOPY (EGD) WITH PROPOFOL;  Surgeon: Otis Brace, MD;  Location: [facility];  Service: Gastroenterology;  Laterality: N/A;  . INCISION AND DRAINAGE ABSCESS     abdominal  . PERIPHERAL VASCULAR CATHETERIZATION N/A 03/07/2016   Procedure: Venogram;  Surgeon: Elam Dutch, MD;  Location: [facility] CV [facility];  Service: Cardiovascular;  Laterality: N/A;  . PERIPHERAL VASCULAR INTERVENTION Left 04/15/2017   Procedure: Peripheral Vascular Intervention;  Surgeon: Waynetta Sandy, MD;  Location: [place] CV [facility];  Service: Cardiovascular;  Laterality: Left;  . REVISON OF ARTERIOVENOUS FISTULA Right 04/20/2013   Procedure: INSERTION OF ARTERIOVENOUS GORTEX GRAFT;  Surgeon: Elam Dutch, MD;  Location: [place];  Service: Vascular;  Laterality: Right;  Ultrasound Guided  . TONSILLECTOMY         Home Medications    Prior to Admission medications   Medication Sig Start Date End Date Taking? Authorizing Provider  acyclovir (ZOVIRAX) 400 MG tablet Take 400 mg tablet in evening after each HD session for the next week. 10/10/17  Yes Nicolette Bang, DO  albuterol (PROVENTIL HFA;VENTOLIN HFA) 108 (90 Base) MCG/ACT inhaler Inhale 2 puffs into the lungs every 6 (six) hours as needed for wheezing or shortness of breath.   Yes [provider]  calcium acetate (PHOSLO) 667 MG capsule Take 1,334-2,668 mg by mouth See admin instructions. Take 1334 mg by mouth three times a day with meals and 2,668 mg (also) with each snack   Yes [provider]  darunavir (PREZISTA) 800 MG tablet TAKE 1 TABLET BY MOUTH AT BEDTIME WITH NORVIR Patient taking differently: Take 800 mg by mouth at bedtime.  03/12/17  Yes Michel Bickers, MD  dolutegravir (TIVICAY) 50 MG tablet Take 1 tablet (50 mg total) by mouth at bedtime. 03/12/17  Yes Michel Bickers, MD  gabapentin (NEURONTIN) 100 MG capsule Take  1 capsule (100 mg total) by mouth 3 (three) times daily for 7 days. 10/09/17 10/20/17 Yes Marathon Bing, DO  Multiple Vitamins-Minerals (MULTIVITAMIN PO) Take 1 tablet by mouth daily.   Yes [provider]  oxyCODONE-acetaminophen (PERCOCET/ROXICET) 5-325 MG tablet Take 1 tablet by mouth every 4 (four) hours as needed for severe pain.   Yes [provider]  ritonavir (NORVIR) 100 MG TABS tablet TAKE 1 TABLET BY MOUTH AT BEDTIME. TAKE WITH PREZISTA TABLET Patient taking differently: Take 100 mg by mouth at bedtime.  03/12/17  Yes Michel Bickers, MD  tenofovir (VIREAD) 300 MG tablet TAKE 1 TABLET BY MOUTH ONCE A WEEK ON SUNDAYS AT BEDTIME Patient taking differently: Take 300 mg by mouth every Sunday.  03/12/17  Yes Michel Bickers, MD  docusate sodium (COLACE) 100 MG capsule Take 1 capsule (100 mg total) by mouth daily. Patient not taking: Reported on 04/15/2017 12/10/16   Eloise Levels, MD  pantoprazole (PROTONIX) 40 MG tablet TAKE 1 TABLET BY MOUTH TWICE DAILY Patient not taking: Reported on  08/17/2017 01/08/17   Mercy Riding, MD    Family History Family History  Problem Relation Age of Onset  . Diabetes Mother   . Arthritis Father        Gout- Foot    Social History Social History   Tobacco Use  . Smoking status: Heavy Tobacco Smoker    Packs/day: 0.50    Years: 27.00    Pack years: 13.50    Types: Cigarettes  . Smokeless tobacco: Never Used  Substance Use Topics  . Alcohol use: No    Alcohol/week: 0.0 oz    Comment: 03/04/2013 "stopped drinking ~ 12 yr ago; never had problem w/it".    . Drug use: No     Allergies   Penicillins; Contrast media [iodinated diagnostic agents]; Acetaminophen-codeine; Oxycodone-acetaminophen; and Tape   Review of Systems Review of Systems  Constitutional: Negative for chills and fever.  HENT: Negative for facial swelling and sore throat.   Respiratory: Negative for shortness of breath.   Cardiovascular: Negative for chest  pain.  Gastrointestinal: Negative for abdominal pain, nausea and vomiting.  Genitourinary: Negative for dysuria.  Musculoskeletal: Positive for neck pain (L side skin). Negative for back pain.  Skin: Positive for rash. Negative for wound.  Neurological: Negative for headaches.  Psychiatric/Behavioral: The patient is not nervous/anxious.      Physical Exam Updated Vital Signs BP 119/69   Pulse 64   Temp 98.4 F (36.9 C) (Axillary)   Resp 13   Ht _0  (1.803 m)   Wt 67.6 kg (149 lb)   SpO2 100%   BMI 20.78 kg/m   Physical Exam  Constitutional: He appears well-developed and well-nourished. No distress.  HENT:  Head: Normocephalic and atraumatic.  Mouth/Throat: Oropharynx is clear and moist. No oropharyngeal exudate.  Eyes: Conjunctivae are normal. Pupils are equal, round, and reactive to light. Right eye exhibits no discharge. Left eye exhibits no discharge. No scleral icterus.  Neck: Normal range of motion. Neck supple. No thyromegaly present.  Cardiovascular: Normal rate, regular rhythm, normal heart sounds and intact distal pulses. Exam reveals no gallop and no friction rub.  No murmur heard. AV fistula in left arm  Pulmonary/Chest: Effort normal and breath sounds normal. No stridor. No respiratory distress. He has no wheezes. He has no rales.  Abdominal: Soft. Bowel sounds are normal. He exhibits no distension. There is no tenderness. There is no rebound and no guarding.  Musculoskeletal: He exhibits edema (2+ pitting BLEs).  Lymphadenopathy:    He has no cervical adenopathy.  Neurological: He is alert. Coordination normal.  Skin: Skin is warm and dry. No rash noted. He is not diaphoretic. No pallor.  Healing rash to left-sided neck and left shoulder, tender to light touch, no erythema or drainage  Psychiatric: He has a normal mood and affect.  Nursing note and vitals reviewed.    ED Treatments / Results  Labs (all labs ordered are listed, but only abnormal results  are displayed) Labs Reviewed  CBC WITH DIFFERENTIAL/PLATELET - Abnormal; Notable for the following components:      Result Value   RBC 3.18 (*)    Hemoglobin 10.3 (*)    HCT 30.5 (*)    RDW 16.0 (*)    Platelets 100 (*)    All other components within normal limits  BASIC METABOLIC PANEL - Abnormal; Notable for the following components:   Sodium 155 (*)    Potassium 5.5 (*)    Glucose, Bld 128 (*)  BUN 79 (*)    Creatinine, Ser 12.99 (*)    Calcium 8.1 (*)    GFR calc non Af Amer 4 (*)    GFR calc Af Amer 5 (*)    Anion gap 18 (*)    All other components within normal limits  I-STAT TROPONIN, ED    EKG  EKG Interpretation  Date/Time:  Tuesday October 20 2017 23:22:25 EST Ventricular Rate:  60 PR Interval:    QRS Duration: 129 QT Interval:  471 QTC Calculation: 471 R Axis:   84 Text Interpretation:  Sinus rhythm LVH with secondary repolarization abnormality No significant change since last tracing Confirmed by Orlie Dakin [phone]) on 10/20/2017 11:32:04 PM       Radiology Dg Chest 2 View  Result Date: 10/20/2017 CLINICAL DATA:  Left-sided neck pain and rash going from neck to left arm. Patient is being treated for shingles. No fever, chills or dyspnea. EXAM: CHEST  2 VIEW COMPARISON:  CXR exams 12/08/2016 and 11/08/2015. Chest CT 10/15/2015. FINDINGS: Cardiomegaly is noted, somewhat increased in prominence since prior exams with aortic atherosclerosis and mild vascular congestion. Calcified densities project over the medial right lower lobe as before. No effusion or pneumothorax. Vascular stents project over the left subclavian, axillary and brachial regions. IMPRESSION: Cardiomegaly, progressed since prior exams with stable aortic atherosclerosis. Central vascular congestion is noted. Electronically Signed   By: Ashley Royalty M.D.   On: 10/20/2017 23:03    Procedures Procedures (including critical care time)  Medications Ordered in ED Medications  HYDROmorphone  (DILAUDID) injection 1 mg (1 mg Intramuscular Given 10/20/17 2335)     Initial Impression / Assessment and Plan / ED Course  I have reviewed the triage vital signs and the nursing notes.  Pertinent labs & imaging results that were available during my care of the patient were reviewed by me and considered in my medical decision making (see chart for details).     Patient presenting with ongoing pain to his left sided neck, left upper chest, and left shoulder where his herpes zoster is healing.  Patient is currently on gabapentin, Percocet for postherpetic neuralgia.  He is still having pain despite these medications.  Patient given 1 dose of IM Dilaudid, as he was just recently hospitalized for this and was given IV Dilaudid every 3 hours.  Patient missed dialysis yesterday.  BMP shows sodium 155, potassium 5.5, elevated BUN and creatinine, and anion gap 18.  Patient with stable chronic anemia, hemoglobin 10.3.  I consulted nephrologist, Dr. Florene Glen, who advised patient is safe to follow-up with dialysis at his appointment tomorrow morning at 6 AM.  Patient denies any shortness of breath and is acting at his baseline.  Chest x-ray shows only mild vascular congestion.  Patient advised to follow-up with his PCP for further management of his postherpetic neuralgia pain.  Advised patient not to wear sure as much as possible, as I feel his shirt rubbing against the area is irritating and exacerbating the pain.  Return precautions discussed.  Patient understands and agrees with plan.  Patient vitals stable throughout ED course and discharged in satisfactory condition.  Patient also evaluated by Dr. Cathleen Fears who guided the patient's management and agrees with plan.  Final Clinical Impressions(s) / ED Diagnoses   Final diagnoses:  Post herpetic neuralgia  Hypernatremia  Hyperkalemia    ED Discharge Orders    None       Frederica Kuster, PA-C 10/20/17 2358    Orlie Dakin, MD  10/21/17  0015  

## 2017-10-20 NOTE — ED Notes (Signed)
Unsuccessful attempt to start IV by this RN x 1. Pt incredibly tense on insertion and contracts legs and arms when attempting to start line.

## 2017-10-20 NOTE — ED Triage Notes (Signed)
Pt c/o 7/10 left side neck pain and rash going from his neck to his left arm. Pt states he is been on treatment for shingles for the past 2 weeks, but the pain continues to be strong. No fever or chills, no SOB.

## 2017-10-20 NOTE — ED Notes (Signed)
ED Provider at bedside. 

## 2017-10-20 NOTE — Discharge Instructions (Signed)
Please make sure to go to your dialysis appointment tomorrow.  Please return to emergency department if you develop any new or worsening symptoms.  Please follow-up with your doctor for further management of your pain.

## 2017-10-21 ENCOUNTER — Other Ambulatory Visit: Payer: Self-pay | Admitting: Internal Medicine

## 2017-10-21 ENCOUNTER — Ambulatory Visit: Payer: Medicare Other

## 2017-10-21 DIAGNOSIS — B20 Human immunodeficiency virus [HIV] disease: Secondary | ICD-10-CM

## 2017-10-22 ENCOUNTER — Other Ambulatory Visit: Payer: Self-pay | Admitting: Behavioral Health

## 2017-10-22 DIAGNOSIS — B20 Human immunodeficiency virus [HIV] disease: Secondary | ICD-10-CM

## 2017-10-22 MED ORDER — DARUNAVIR ETHANOLATE 800 MG PO TABS
ORAL_TABLET | ORAL | 1 refills | Status: AC
Start: 2017-10-22 — End: ?

## 2017-10-22 MED ORDER — RITONAVIR 100 MG PO TABS: ORAL_TABLET | ORAL | 1 refills | Status: DC

## 2017-10-22 MED ORDER — DOLUTEGRAVIR SODIUM 50 MG PO TABS
50.0000 mg | ORAL_TABLET | Freq: Every day | ORAL | 1 refills | Status: AC
Start: 2017-10-22 — End: ?

## 2017-10-22 MED ORDER — TENOFOVIR DISOPROXIL FUMARATE 300 MG PO TABS: ORAL_TABLET | ORAL | 1 refills | Status: DC

## 2017-10-23 ENCOUNTER — Emergency Department (HOSPITAL_COMMUNITY)
Admission: EM | Admit: 2017-10-23 | Discharge: 2017-10-23 | Disposition: A | Discharge status: Home / Self Care | Payer: Medicare Other | Attending: Emergency Medicine | Admitting: Emergency Medicine

## 2017-10-23 ENCOUNTER — Other Ambulatory Visit: Payer: Self-pay

## 2017-10-23 ENCOUNTER — Encounter (HOSPITAL_COMMUNITY): Payer: Self-pay | Admitting: Emergency Medicine

## 2017-10-23 ENCOUNTER — Telehealth: Payer: Self-pay | Admitting: Student

## 2017-10-23 DIAGNOSIS — I120 Hypertensive chronic kidney disease with stage 5 chronic kidney disease or end stage renal disease: Secondary | ICD-10-CM | POA: Insufficient documentation

## 2017-10-23 DIAGNOSIS — F17210 Nicotine dependence, cigarettes, uncomplicated: Secondary | ICD-10-CM | POA: Insufficient documentation

## 2017-10-23 DIAGNOSIS — Z79899 Other long term (current) drug therapy: Secondary | ICD-10-CM | POA: Diagnosis not present

## 2017-10-23 DIAGNOSIS — B20 Human immunodeficiency virus [HIV] disease: Secondary | ICD-10-CM | POA: Diagnosis not present

## 2017-10-23 DIAGNOSIS — N186 End stage renal disease: Secondary | ICD-10-CM | POA: Insufficient documentation

## 2017-10-23 DIAGNOSIS — L298 Other pruritus: Secondary | ICD-10-CM | POA: Diagnosis present

## 2017-10-23 DIAGNOSIS — Z992 Dependence on renal dialysis: Secondary | ICD-10-CM | POA: Diagnosis not present

## 2017-10-23 DIAGNOSIS — B028 Zoster with other complications: Secondary | ICD-10-CM | POA: Insufficient documentation

## 2017-10-23 MED ORDER — HYDROXYZINE HCL 25 MG PO TABS: 25.0000 mg | ORAL_TABLET | Freq: Four times a day (QID) | ORAL | 0 refills | Status: DC

## 2017-10-23 NOTE — Discharge Instructions (Signed)
You may apply lidocaine patch to the left shoulder every 12 hours. The 4% patches can be purchased over the counter. Verify with a pharmacist that it is safe to use with your history of renal failure.  Follow up with your primary care doctor as soon as possible.

## 2017-10-23 NOTE — ED Provider Notes (Signed)
[facility] DEPT Provider Note   CSN: [id number] Arrival date & time: 10/23/17  1933     History   Chief Complaint Chief Complaint  Patient presents with  . Pruritis    HPI [name] is a 52 y.o. male who presents with complaint of Itching and pain at the site of a shingles outbreak on the left side of his neck. He was dx 2.5 weeks ago and has had 3 ER visits since. He has been on acyclovir, steroids, oxycodone, benadryl, and gabapentin with little releif. He denies redness, swelling, or new signs of infection. 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 Last dialysis yesterday. Last viral load undetectable and CD4 count 780  HPI  Past Medical History:  Diagnosis Date  . Anal condyloma   . Anemia   . Diarrhea    occurs because of dialysis  . DJD (degenerative joint disease)    right knee.  All joints  . ESRD (end stage renal disease) on dialysis ([place]) 01/02/2012   Gets diaysis at [facility] on [address], TTS schedule.  Started dialysis in 2000.     . Family history of disseminated HSV infection   . Genital herpes   . Hemodialysis patient [facility])    Tuesday, Thursday, Saturday  . History of blood transfusion   . HIV infection ([place])   . Hypertension   . Pneumonia 1/20107   hx of  . Shortness of breath    "can happen at anytime" (03/04/2013).  03/14/16- "when I have too much fluid"  . Tertiary syphilis   . Thrombosis of dialysis vascular access ([place]) 01/02/2012   RUA AV fistula clotted on 02/27/13, declotted by IR. Reclotted on 03/02/13 and IR placed L IJ tunneled HD catheter (a right external jugular tunneled HD cath was attempted first but did not function due to kinking across the clavicle). Pt was seen by [facility] on same admission 6/614 and said RUA AVF no longer usable, they ordered vein mapping and will see him as outpatient to set up next permanent access.       Patient Active Problem List   Diagnosis Date Noted    . Herpes zoster 10/08/2017  . HIV (human immunodeficiency virus infection) ([place])   . Nonrheumatic aortic valve stenosis   . PUD (peptic ulcer disease)   . Anemia 01/01/2017  . Toe ulcer ([place])   . GI bleed 12/07/2016  . Gastroesophageal reflux disease 09/13/2016  . Annual physical exam 09/13/2016  . Normocytic anemia 10/14/2015  . Thrombocytopenia ([place]) 10/14/2015  . Gall stone   . 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 End stage renal disease on dialysis ([place]) 01/02/2012  . PERIPHERAL NEUROPATHY 04/10/2009  . BACTEREMIA, MYCOBACTERIUM AVIUM COMPLEX 10/19/2006  . HIV disease ([place]) 10/19/2006  . HERPES, GENITAL NEC 10/19/2006  . WARTS, OTHER SPECIFIED VIRAL 10/19/2006  . SYPHILIS NOS 10/19/2006  . CIGARETTE SMOKER 10/19/2006  . Depression 10/19/2006    Past Surgical History:  Procedure Laterality Date  . A/V SHUNTOGRAM Left 04/15/2017   Procedure: A/V Shuntogram;  Surgeon: Waynetta Sandy, MD;  Location: [facility];  Service: Cardiovascular;  Laterality: Left;  . APPENDECTOMY    . AV FISTULA PLACEMENT Right 06/16/11  . AV FISTULA PLACEMENT Left 03/17/2016   Procedure: INSERTION OF ARTERIOVENOUS (AV) GORE-TEX GRAFT ARM USING GORETEX 4-7MM X 45CM STRETCH GRAFT;  Surgeon: Elam Dutch, MD;  Location: [facility];  Service: Vascular;  Laterality:  Left;  . AV FISTULA PLACEMENT, BRACHIOCEPHALIC Right 80/32/12  . ESOPHAGOGASTRODUODENOSCOPY N/A 12/09/2016   Procedure: ESOPHAGOGASTRODUODENOSCOPY (EGD);  Surgeon: Otis Brace, MD;  Location: [facility];  Service: Gastroenterology;  Laterality: N/A;  . ESOPHAGOGASTRODUODENOSCOPY N/A 01/05/2017   Procedure: ESOPHAGOGASTRODUODENOSCOPY (EGD);  Surgeon: Clarene Essex, MD;  Location: [facility] ENDOSCOPY;  Service: Endoscopy;  Laterality: N/A;  . ESOPHAGOGASTRODUODENOSCOPY (EGD) WITH PROPOFOL N/A 01/06/2017   Procedure: ESOPHAGOGASTRODUODENOSCOPY (EGD) WITH PROPOFOL;  Surgeon: Otis Brace, MD;  Location: [facility];   Service: Gastroenterology;  Laterality: N/A;  . INCISION AND DRAINAGE ABSCESS     abdominal  . PERIPHERAL VASCULAR CATHETERIZATION N/A 03/07/2016   Procedure: Venogram;  Surgeon: Elam Dutch, MD;  Location: [place] [facility];  Service: Cardiovascular;  Laterality: N/A;  . PERIPHERAL VASCULAR INTERVENTION Left 04/15/2017   Procedure: Peripheral Vascular Intervention;  Surgeon: Waynetta Sandy, MD;  Location: [place] [facility];  Service: Cardiovascular;  Laterality: Left;  . REVISON OF ARTERIOVENOUS FISTULA Right 04/20/2013   Procedure: INSERTION OF ARTERIOVENOUS GORTEX GRAFT;  Surgeon: Elam Dutch, MD;  Location: [facility];  Service: Vascular;  Laterality: Right;  Ultrasound Guided  . TONSILLECTOMY         Home Medications    Prior to Admission medications   Medication Sig Start Date End Date Taking? Authorizing Provider  acyclovir (ZOVIRAX) 400 MG tablet Take 400 mg tablet in evening after each HD session for the next week. 10/10/17   Nicolette Bang, DO  albuterol (PROVENTIL HFA;VENTOLIN HFA) 108 (90 Base) MCG/ACT inhaler Inhale 2 puffs into the lungs every 6 (six) hours as needed for wheezing or shortness of breath.    [provider]  calcium acetate (PHOSLO) 667 MG capsule Take 1,334-2,668 mg by mouth See admin instructions. Take 1334 mg by mouth three times a day with meals and 2,668 mg (also) with each snack    [provider]  darunavir (PREZISTA) 800 MG tablet TAKE 1 TABLET BY MOUTH AT BEDTIME WITH NORVIR 10/22/17   Michel Bickers, MD  docusate sodium (COLACE) 100 MG capsule Take 1 capsule (100 mg total) by mouth daily. Patient not taking: Reported on 04/15/2017 12/10/16   Eloise Levels, MD  dolutegravir (TIVICAY) 50 MG tablet Take 1 tablet (50 mg total) by mouth at bedtime. 10/22/17   Michel Bickers, MD  gabapentin (NEURONTIN) 100 MG capsule Take 1 capsule (100 mg total) by mouth 3 (three) times daily for 7 days. 10/09/17 10/20/17  Justice Bing, DO  Multiple Vitamins-Minerals (MULTIVITAMIN PO) Take 1 tablet by mouth daily.    [provider]  oxyCODONE-acetaminophen (PERCOCET/ROXICET) 5-325 MG tablet Take 1 tablet by mouth every 4 (four) hours as needed for severe pain.    [provider]  pantoprazole (PROTONIX) 40 MG tablet TAKE 1 TABLET BY MOUTH TWICE DAILY Patient not taking: Reported on 08/17/2017 01/08/17   Mercy Riding, MD  ritonavir (NORVIR) 100 MG TABS tablet TAKE 1 TABLET BY MOUTH AT BEDTIME. TAKE WITH PREZISTA TABLET 10/22/17   Michel Bickers, MD  tenofovir (VIREAD) 300 MG tablet TAKE 1 TABLET BY MOUTH ONCE A WEEK ON SUNDAYS AT BEDTIME 10/22/17   Michel Bickers, MD    Family History Family History  Problem Relation Age of Onset  . Diabetes Mother   . Arthritis Father        Gout- Foot    Social History Social History   Tobacco Use  . Smoking status: Heavy Tobacco Smoker    Packs/day: 0.50  Years: 27.00    Pack years: 13.50    Types: Cigarettes  . Smokeless tobacco: Never Used  Substance Use Topics  . Alcohol use: No    Alcohol/week: 0.0 oz    Comment: 03/04/2013 "stopped drinking ~ 12 yr ago; never had problem w/it".    . Drug use: No     Allergies   Penicillins; Contrast media [iodinated diagnostic agents]; Acetaminophen-codeine; Oxycodone-acetaminophen; and Tape   Review of Systems Review of Systems Ten systems reviewed and are negative for acute change, except as noted in the HPI.    Physical Exam Updated Vital Signs BP 125/86 (BP Location: Left Arm)   Pulse 70   Temp 98.2 F (36.8 C) (Oral)   Resp 20   Ht _0  (1.803 m)   Wt 67.6 kg (149 lb)   SpO2 98%   BMI 20.78 kg/m   Physical Exam  Constitutional: He appears well-developed and well-nourished. No distress.  Patient appears much older than stated age  HENT:  Head: Normocephalic and atraumatic.  Eyes: Conjunctivae are normal. No scleral icterus.  Neck: Normal range of motion. Neck supple.    Cardiovascular: Normal rate, regular rhythm and normal heart sounds.  AV graft in the left upper extremity with palpable thrill  Pulmonary/Chest: Effort normal and breath sounds normal. No respiratory distress.  Abdominal: Soft. There is no tenderness.  Musculoskeletal: He exhibits no edema.  Neurological: He is alert.  Skin: Skin is warm and dry. He is not diaphoretic.  Well healing zoster outbreak on the Left shoulder  Psychiatric: His behavior is normal.  Nursing note and vitals reviewed.    ED Treatments / Results  Labs (all labs ordered are listed, but only abnormal results are displayed) Labs Reviewed - No data to display  EKG  EKG Interpretation None       Radiology No results found.  Procedures Procedures (including critical care time)  Medications Ordered in ED Medications - No data to display   Initial Impression / Assessment and Plan / ED Course  I have reviewed the triage vital signs and the nursing notes.  Pertinent labs & imaging results that were available during my care of the patient were reviewed by me and considered in my medical decision making (see chart for details).     Patient with apparent post herpetic neuralgia. Patient will be discharged with hydroxyzine which does not need renal dosing modification. He may also try otc lidocaine patch and should verify safety with the pharmacist. Patient instructed to follow closely with pcp for any ongoing issues with his shingles. Discussed ED return precautions.   Final Clinical Impressions(s) / ED Diagnoses   Final diagnoses:  Herpes zoster with complication    ED Discharge Orders        Ordered    hydrOXYzine (ATARAX/VISTARIL) 25 MG tablet  Every 6 hours     10/23/17 2023       Margarita Mail, PA-C 10/23/17 2039    Orlie Dakin, MD 10/23/17 2256

## 2017-10-23 NOTE — ED Triage Notes (Addendum)
Patient wants something for the itching from his shingles. Patient has been on valtrex for 3 days.

## 2017-10-23 NOTE — Telephone Encounter (Signed)
Pt needs a Rx for shingles. He said he was told to take Benadryl and if that didn't work then to call and he would be given a Rx. Please advise

## 2017-10-23 NOTE — ED Notes (Signed)
Bed: [facility]1 Expected date:  Expected time:  Means of arrival:  Comments: 

## 2017-10-26 NOTE — Telephone Encounter (Signed)
Pt is returning a phone call from the office. I didn't see in his chart where anyone called him recently, didn't know if it could have been about the information below.

## 2017-10-26 NOTE — Telephone Encounter (Signed)
I didn't call the patient either.  Thanks! Alwyn Renaye

## 2017-10-28 ENCOUNTER — Ambulatory Visit (INDEPENDENT_AMBULATORY_CARE_PROVIDER_SITE_OTHER): Payer: Medicare Other | Admitting: Family Medicine

## 2017-10-28 ENCOUNTER — Other Ambulatory Visit: Payer: Self-pay

## 2017-10-28 ENCOUNTER — Encounter: Payer: Self-pay | Admitting: Family Medicine

## 2017-10-28 VITALS — BP 110/64 | HR 57 | Wt 152.0 lb

## 2017-10-28 DIAGNOSIS — L299 Pruritus, unspecified: Secondary | ICD-10-CM | POA: Diagnosis present

## 2017-10-28 MED ORDER — HYDROXYZINE HCL 10 MG PO TABS: 10.0000 mg | ORAL_TABLET | Freq: Four times a day (QID) | ORAL | 0 refills | Status: DC | PRN

## 2017-10-28 MED ORDER — HYDROXYZINE HCL 25 MG PO TABS: 25.0000 mg | ORAL_TABLET | Freq: Four times a day (QID) | ORAL | 0 refills | Status: AC | PRN

## 2017-10-28 NOTE — Patient Instructions (Signed)
Thank you for coming in to see us today. Please see below to review our plan for today's visit.  1.  I have given you a refill of the hydroxyzine 25 mg tablets.  Take 1 tablet every 6 hours (4 times daily).  These can make you drowsy so do not operate a vehicle or heavy machinery while taking this medication. 2.  Return in 2 weeks if your symptoms do not improve.  Please call the clinic at [phone] if your symptoms worsen or you have any concerns. It was our pleasure to serve you.  Durward Parcelavid Noal Abshier, DO [facility], PGY-2

## 2017-10-28 NOTE — Assessment & Plan Note (Addendum)
Subacute.  Appears to be limited to dermatomal region from prior shingles outbreak.  Patient without terms of pain.  Shingles rash appears to be healing well.  Suspect this may be from healing process and would likely benefit from antihistamine.  Will try to avoid topical or systemic steroids for now. - Hydroxyzine 25 mg every 6 hours as needed for itching - RTC 1 week if symptoms do not improve

## 2017-10-28 NOTE — Progress Notes (Signed)
   Subjective   Patient ID: Garald BaldingJeffrey E Dohrman    DOB: [birth year], 52 y.o. male   MRN: [id number]  CC: "Itching"  HPI: Garald BaldingJeffrey E Neuzil is a 52 y.o. male who presents for a same day appointment for the following:  Itching: Patient has been experiencing itching on the site of his shingles.  This is been an ongoing problem since discharge from the hospital.  He has been evaluated twice in the ED without improvement using gabapentin or oxycodone-acetaminophen.  He was taking hydroxyzine though at suboptimal dose with approximately 2-3 pills/day for only a few days.  Patient has also been applying lotions to the area without improvement.  He denies fevers or chills, pain, nausea or vomiting, abdominal pain, joint pain.  ROS: see HPI for pertinent.  PMFSH: HIV (CD4 780), ESRD (T,Th,S), anemia, thrombocytopenia, GERD, tobacco use disorder.  Surgical history right and left AV fistula, appendectomy, cholecystectomy, tonsillectomy.  Family history DM, arthritis.  Smoking status reviewed. Medications reviewed.  Objective   BP 110/64   Pulse (!) 57   Wt 152 lb (68.9 kg)   SpO2 98%   BMI 21.20 kg/m  Vitals and nursing note reviewed.  General: thin male with AV fistula in right and left arm, NAD with non-toxic appearance HEENT: normocephalic, atraumatic, moist mucous membranes Neck: supple, non-tender without lymphadenopathy, healing shingles affecting C3-8 Cardiovascular: regular rate and rhythm without murmurs, rubs, or gallops Lungs: clear to auscultation bilaterally with normal work of breathing Skin: warm, dry, cap refill < 2 seconds Extremities: warm and well perfused, normal tone, no edema      Assessment & Plan   Itching Subacute.  Appears to be limited to dermatomal region from prior shingles outbreak.  Patient without terms of pain.  Shingles rash appears to be healing well.  Suspect this may be from healing process and would likely benefit from antihistamine.  Will try to  avoid topical or systemic steroids for now. - Hydroxyzine 25 mg every 6 hours as needed for itching - RTC 1 week if symptoms do not improve  No orders of the defined types were placed in this encounter.  Meds ordered this encounter  Medications  . DISCONTD: hydrOXYzine (ATARAX/VISTARIL) 10 MG tablet    Sig: Take 1 tablet (10 mg total) by mouth every 6 (six) hours as needed for itching.    Dispense:  60 tablet    Refill:  0  . hydrOXYzine (ATARAX/VISTARIL) 25 MG tablet    Sig: Take 1 tablet (25 mg total) by mouth every 6 (six) hours as needed for itching.    Dispense:  60 tablet    Refill:  0    Durward Parcelavid McMullen, DO [facility], PGY-2 10/29/2017, 4:28 PM

## 2017-11-05 ENCOUNTER — Ambulatory Visit (HOSPITAL_COMMUNITY)
Admission: EM | Admit: 2017-11-05 | Discharge: 2017-11-05 | Disposition: A | Discharge status: Home / Self Care | Payer: Medicare Other | Attending: Family Medicine | Admitting: Family Medicine

## 2017-11-05 ENCOUNTER — Encounter (HOSPITAL_COMMUNITY): Payer: Self-pay | Admitting: Family Medicine

## 2017-11-05 DIAGNOSIS — R58 Hemorrhage, not elsewhere classified: Secondary | ICD-10-CM

## 2017-11-05 NOTE — ED Triage Notes (Signed)
Pt here for bleeding that has continued from his vascular access since Tuesday. Reports that they put a clotting band aid  On the area but still bleeding. He went to have dialysis today and they changed the bandage again. Was told that he needed a stitch.

## 2017-11-07 ENCOUNTER — Other Ambulatory Visit: Payer: Self-pay

## 2017-11-07 ENCOUNTER — Encounter (HOSPITAL_COMMUNITY): Payer: Self-pay | Admitting: Emergency Medicine

## 2017-11-07 DIAGNOSIS — R599 Enlarged lymph nodes, unspecified: Secondary | ICD-10-CM | POA: Diagnosis not present

## 2017-11-07 DIAGNOSIS — Z992 Dependence on renal dialysis: Secondary | ICD-10-CM | POA: Diagnosis not present

## 2017-11-07 DIAGNOSIS — F17210 Nicotine dependence, cigarettes, uncomplicated: Secondary | ICD-10-CM | POA: Insufficient documentation

## 2017-11-07 DIAGNOSIS — I120 Hypertensive chronic kidney disease with stage 5 chronic kidney disease or end stage renal disease: Secondary | ICD-10-CM | POA: Diagnosis not present

## 2017-11-07 DIAGNOSIS — B029 Zoster without complications: Secondary | ICD-10-CM | POA: Diagnosis not present

## 2017-11-07 DIAGNOSIS — N186 End stage renal disease: Secondary | ICD-10-CM | POA: Insufficient documentation

## 2017-11-07 DIAGNOSIS — Z79899 Other long term (current) drug therapy: Secondary | ICD-10-CM | POA: Insufficient documentation

## 2017-11-07 NOTE — ED Triage Notes (Signed)
Reports being diagnosed with shingles last week.  Lesions noted to back of the neck and back of head.  Patient states I don't know if this swelling is from that or not.  Mild swelling noted.  Also c/o headache since yesterday.

## 2017-11-08 ENCOUNTER — Emergency Department (HOSPITAL_COMMUNITY)
Admission: EM | Admit: 2017-11-08 | Discharge: 2017-11-08 | Disposition: A | Discharge status: Home / Self Care | Payer: Medicare Other | Attending: Emergency Medicine | Admitting: Emergency Medicine

## 2017-11-08 DIAGNOSIS — R599 Enlarged lymph nodes, unspecified: Secondary | ICD-10-CM

## 2017-11-08 DIAGNOSIS — B029 Zoster without complications: Secondary | ICD-10-CM

## 2017-11-08 NOTE — ED Notes (Signed)
ED Provider at bedside. 

## 2017-11-08 NOTE — ED Provider Notes (Signed)
[facility] EMERGENCY DEPARTMENT Provider Note   CSN: [id number] Arrival date & time: 11/07/17  2151     History   Chief Complaint Chief Complaint  Patient presents with  . Headache  . swelling to back of head    HPI [name] is a 52 y.o. male.  HPI  This a 52 year old male with history of end-stage renal disease on dialysis, HIV, hypertension who presents with concern for a lump on the back of his head.  Patient reports that he was diagnosed with shingles.  He finished a course of acyclovir and is currently only on hydroxyzine for itching.  He noted a lump in his occiput and was concerned about it.  It is not particularly painful.  He states I just wanted to make sure it was okay.  He denies any fevers.  Denies any headache.  Past Medical History:  Diagnosis Date  . Anal condyloma   . Anemia   . Diarrhea    occurs because of dialysis  . DJD (degenerative joint disease)    right knee.  All joints  . ESRD (end stage renal disease) on dialysis ([place]) 01/02/2012   Gets diaysis at [facility] on [address], TTS schedule.  Started dialysis in 2000.     . Family history of disseminated HSV infection   . Genital herpes   . Hemodialysis patient [facility])    Tuesday, Thursday, Saturday  . History of blood transfusion   . HIV infection ([place])   . Hypertension   . Pneumonia 1/20107   hx of  . Shortness of breath    "can happen at anytime" (03/04/2013).  03/14/16- "when I have too much fluid"  . Tertiary syphilis   . Thrombosis of dialysis vascular access ([place]) 01/02/2012   RUA AV fistula clotted on 02/27/13, declotted by IR. Reclotted on 03/02/13 and IR placed L IJ tunneled HD catheter (a right external jugular tunneled HD cath was attempted first but did not function due to kinking across the clavicle). Pt was seen by VVS on same admission 6/614 and said RUA AVF no longer usable, they ordered vein mapping and will see him as outpatient to set up next permanent  access.       Patient Active Problem List   Diagnosis Date Noted  . Itching 10/28/2017  . Herpes zoster 10/08/2017  . HIV (human immunodeficiency virus infection) ([place])   . Nonrheumatic aortic valve stenosis   . PUD (peptic ulcer disease)   . Anemia 01/01/2017  . Toe ulcer ([place])   . GI bleed 12/07/2016  . Gastroesophageal reflux disease 09/13/2016  . Annual physical exam 09/13/2016  . Normocytic anemia 10/14/2015  . Thrombocytopenia ([place]) 10/14/2015  . Gall stone   . 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 End stage renal disease on dialysis ([place]) 01/02/2012  . PERIPHERAL NEUROPATHY 04/10/2009  . BACTEREMIA, MYCOBACTERIUM AVIUM COMPLEX 10/19/2006  . HIV disease ([place]) 10/19/2006  . HERPES, GENITAL NEC 10/19/2006  . WARTS, OTHER SPECIFIED VIRAL 10/19/2006  . SYPHILIS NOS 10/19/2006  . CIGARETTE SMOKER 10/19/2006  . Depression 10/19/2006    Past Surgical History:  Procedure Laterality Date  . A/V SHUNTOGRAM Left 04/15/2017   Procedure: A/V Shuntogram;  Surgeon: Waynetta Sandy, MD;  Location: [place] CV LAB;  Service: Cardiovascular;  Laterality: Left;  . APPENDECTOMY    . AV FISTULA PLACEMENT Right 06/16/11  . AV FISTULA PLACEMENT Left 03/17/2016   Procedure: INSERTION OF ARTERIOVENOUS (AV) GORE-TEX GRAFT  ARM USING GORETEX 4-7MM X 45CM STRETCH GRAFT;  Surgeon: Elam Dutch, MD;  Location: [employer];  Service: Vascular;  Laterality: Left;  . AV FISTULA PLACEMENT, BRACHIOCEPHALIC Right 25/00/37  . ESOPHAGOGASTRODUODENOSCOPY N/A 12/09/2016   Procedure: ESOPHAGOGASTRODUODENOSCOPY (EGD);  Surgeon: Otis Brace, MD;  Location: [place];  Service: Gastroenterology;  Laterality: N/A;  . ESOPHAGOGASTRODUODENOSCOPY N/A 01/05/2017   Procedure: ESOPHAGOGASTRODUODENOSCOPY (EGD);  Surgeon: Clarene Essex, MD;  Location: [facility] ENDOSCOPY;  Service: Endoscopy;  Laterality: N/A;  . ESOPHAGOGASTRODUODENOSCOPY (EGD) WITH PROPOFOL N/A 01/06/2017   Procedure:  ESOPHAGOGASTRODUODENOSCOPY (EGD) WITH PROPOFOL;  Surgeon: Otis Brace, MD;  Location: [place];  Service: Gastroenterology;  Laterality: N/A;  . INCISION AND DRAINAGE ABSCESS     abdominal  . PERIPHERAL VASCULAR CATHETERIZATION N/A 03/07/2016   Procedure: Venogram;  Surgeon: Elam Dutch, MD;  Location: [place] CV LAB;  Service: Cardiovascular;  Laterality: N/A;  . PERIPHERAL VASCULAR INTERVENTION Left 04/15/2017   Procedure: Peripheral Vascular Intervention;  Surgeon: Waynetta Sandy, MD;  Location: [place] CV LAB;  Service: Cardiovascular;  Laterality: Left;  . REVISON OF ARTERIOVENOUS FISTULA Right 04/20/2013   Procedure: INSERTION OF ARTERIOVENOUS GORTEX GRAFT;  Surgeon: Elam Dutch, MD;  Location: [place];  Service: Vascular;  Laterality: Right;  Ultrasound Guided  . TONSILLECTOMY         Home Medications    Prior to Admission medications   Medication Sig Start Date End Date Taking? Authorizing Provider  albuterol (PROVENTIL HFA;VENTOLIN HFA) 108 (90 Base) MCG/ACT inhaler Inhale 2 puffs into the lungs every 6 (six) hours as needed for wheezing or shortness of breath.   Yes [provider]  calcium acetate (PHOSLO) 667 MG capsule Take 1,334-2,668 mg by mouth See admin instructions. Take 1334 mg by mouth three times a day with meals and 2,668 mg (also) with each snack   Yes [provider]  darunavir (PREZISTA) 800 MG tablet TAKE 1 TABLET BY MOUTH AT BEDTIME WITH NORVIR 10/22/17  Yes Michel Bickers, MD  dolutegravir (TIVICAY) 50 MG tablet Take 1 tablet (50 mg total) by mouth at bedtime. 10/22/17  Yes Michel Bickers, MD  hydrOXYzine (ATARAX/VISTARIL) 25 MG tablet Take 1 tablet (25 mg total) by mouth every 6 (six) hours as needed for itching. 10/28/17  Yes [place] Bing, DO  Multiple Vitamins-Minerals (MULTIVITAMIN PO) Take 1 tablet by mouth daily.   Yes [provider]  pantoprazole (PROTONIX) 40 MG tablet TAKE 1 TABLET BY MOUTH  TWICE DAILY 01/08/17  Yes Gonfa, Taye T, MD  ritonavir (NORVIR) 100 MG TABS tablet TAKE 1 TABLET BY MOUTH AT BEDTIME. TAKE WITH PREZISTA TABLET 10/22/17  Yes Michel Bickers, MD  tenofovir (VIREAD) 300 MG tablet TAKE 1 TABLET BY MOUTH ONCE A WEEK ON SUNDAYS AT BEDTIME 10/22/17  Yes Michel Bickers, MD  acyclovir (ZOVIRAX) 400 MG tablet Take 400 mg tablet in evening after each HD session for the next week. Patient not taking: Reported on 10/28/2017 10/10/17   Nicolette Bang, DO  docusate sodium (COLACE) 100 MG capsule Take 1 capsule (100 mg total) by mouth daily. Patient not taking: Reported on 11/08/2017 12/10/16   Eloise Levels, MD    Family History Family History  Problem Relation Age of Onset  . Diabetes Mother   . Arthritis Father        Gout- Foot    Social History Social History   Tobacco Use  . Smoking status: Heavy Tobacco Smoker    Packs/day: 0.50  Years: 27.00    Pack years: 13.50    Types: Cigarettes  . Smokeless tobacco: Never Used  Substance Use Topics  . Alcohol use: No    Alcohol/week: 0.0 oz    Comment: 03/04/2013 "stopped drinking ~ 12 yr ago; never had problem w/it".    . Drug use: No     Allergies   Penicillins; Contrast media [iodinated diagnostic agents]; Acetaminophen-codeine; Oxycodone-acetaminophen; and Tape   Review of Systems Review of Systems  Constitutional: Negative for fever.  HENT:       Lump on head  Skin: Positive for rash.  Neurological: Negative for headaches.  All other systems reviewed and are negative.    Physical Exam Updated Vital Signs BP 120/70   Pulse 72   Temp 97.7 F (36.5 C) (Oral)   Resp 15   Ht _0  (1.803 m)   Wt 70.3 kg (155 lb)   SpO2 100%   BMI 21.62 kg/m   Physical Exam  Constitutional: He is oriented to person, place, and time.  Chronically ill-appearing, no acute distress  HENT:  Head: Normocephalic and atraumatic.  Palpable occipital lymph node, mobile, nontender, no overlying skin  changes on left  Neck: Normal range of motion. Neck supple.  Cardiovascular: Normal rate and regular rhythm.  No murmur heard. Pulmonary/Chest: Effort normal and breath sounds normal. No respiratory distress.  Abdominal: There is no rebound.  Musculoskeletal: He exhibits no edema.  Neurological: He is alert and oriented to person, place, and time.  Skin: Skin is warm and dry.  Vesicular rash in different stages of healing in the C4, C5 distribution on the left  Psychiatric: He has a normal mood and affect.  Nursing note and vitals reviewed.    ED Treatments / Results  Labs (all labs ordered are listed, but only abnormal results are displayed) Labs Reviewed - No data to display  EKG  EKG Interpretation None       Radiology No results found.  Procedures Procedures (including critical care time)  Medications Ordered in ED Medications - No data to display   Initial Impression / Assessment and Plan / ED Course  I have reviewed the triage vital signs and the nursing notes.  Pertinent labs & imaging results that were available during my care of the patient were reviewed by me and considered in my medical decision making (see chart for details).     Patient presents with concerns for a lump on his head.  He recently had herpes zoster which he got treated for.  On exam he has what appears to be a reactive lymph node.  He is otherwise asymptomatic.  Vital signs reassuring.  Chronically ill-appearing but no signs or symptoms of complications.  Patient reassured.  Will discharge home with primary care follow-up.  After history, exam, and medical workup I feel the patient has been appropriately medically screened and is safe for discharge home. Pertinent diagnoses were discussed with the patient. Patient was given return precautions.   Final Clinical Impressions(s) / ED Diagnoses   Final diagnoses:  Herpes zoster without complication  Reactive lymphadenopathy    ED  Discharge Orders    None       Merryl Hacker, MD 11/08/17 0206

## 2017-11-10 NOTE — ED Provider Notes (Addendum)
[facility]   [id number] 11/05/17 Arrival Time: 1332  ASSESSMENT & PLAN:  1. Bleeding    Very easily cauterized this small area with silver nitrate. Gel foam applied under Band-Aid. No sutures needed. Observe. May f/u as needed.  Reviewed expectations re: course of current medical issues. Questions answered. Outlined signs and symptoms indicating need for more acute intervention. Patient verbalized understanding. After Visit Summary given.   SUBJECTIVE:  [name] is a 52 y.o. male. Pt here for bleeding that has continued from his vascular access since Tuesday. Reports that they put a clotting band aid on the area but still bleeding. He went to have dialysis today and they changed the bandage again. Was told that he needed a stitch.  Continues to slowly ooze.  ROS: As per HPI.   OBJECTIVE:  Vitals:   11/05/17 1452  BP: 114/69  Pulse: 68  Resp: 18  Temp: 98.2 F (36.8 C)  SpO2: 100%    General appearance: alert; no distress Skin: left vascular access with pinpoint area of bleeding superiorly Psychological:  alert and cooperative; normal mood and affect  Results for orders placed or performed during the hospital encounter of 10/20/17  CBC with Differential  Result Value Ref Range   WBC 7.7 4.0 - 10.5 K/uL   RBC 3.18 (L) 4.22 - 5.81 MIL/uL   Hemoglobin 10.3 (L) 13.0 - 17.0 g/dL   HCT 30.830.5 (L) 65.739.0 - 84.652.0 %   MCV 95.9 78.0 - 100.0 fL   MCH 32.4 26.0 - 34.0 pg   MCHC 33.8 30.0 - 36.0 g/dL   RDW 96.216.0 (H) 95.211.5 - 84.115.5 %   Platelets 100 (L) 150 - 400 K/uL   Neutrophils Relative % 64 %   Neutro Abs 5.0 1.7 - 7.7 K/uL   Lymphocytes Relative 26 %   Lymphs Abs 2.0 0.7 - 4.0 K/uL   Monocytes Relative 9 %   Monocytes Absolute 0.7 0.1 - 1.0 K/uL   Eosinophils Relative 1 %   Eosinophils Absolute 0.1 0.0 - 0.7 K/uL   Basophils Relative 0 %   Basophils Absolute 0.0 0.0 - 0.1 K/uL  Basic metabolic panel  Result Value Ref Range   Sodium 155 (H) 135 -  145 mmol/L   Potassium 5.5 (H) 3.5 - 5.1 mmol/L   Chloride 111 101 - 111 mmol/L   CO2 26 22 - 32 mmol/L   Glucose, Bld 128 (H) 65 - 99 mg/dL   BUN 79 (H) 6 - 20 mg/dL   Creatinine, Ser 32.4412.99 (H) 0.61 - 1.24 mg/dL   Calcium 8.1 (L) 8.9 - 10.3 mg/dL   GFR calc non Af Amer 4 (L) >60 mL/min   GFR calc Af Amer 5 (L) >60 mL/min   Anion gap 18 (H) 5 - 15  I-Stat Troponin, ED (not [facility])  Result Value Ref Range   Troponin i, poc 0.03 0.00 - 0.08 ng/mL   Comment 3            Labs Reviewed - No data to display  No results found.  Allergies  Allergen Reactions  . Penicillins Hives and Other (See Comments)    Rx required Hospitalization Has patient had a PCN reaction causing immediate rash, facial/tongue/throat swelling, SOB or lightheadedness with hypotension: Yes Has patient had a PCN reaction causing severe rash involving mucus membranes or skin necrosis: No Has patient had a PCN reaction that required hospitalization Yes Has patient had a PCN reaction occurring within the last 10  years: No   . Contrast Media [Iodinated Diagnostic Agents] Hives and Itching  . Acetaminophen-Codeine Nausea And Vomiting  . Oxycodone-Acetaminophen Other (See Comments)    Reaction unknown  . Tape Rash and Other (See Comments)    Use paper tape only (NO CLEAR PLASTIC TAPE!!)    PMHx, SurgHx, SocialHx, Medications, and Allergies were reviewed in the Visit Navigator and updated as appropriate.       Mardella Layman, MD 11/10/17 1610    Mardella Layman, MD 11/10/17 [phone]

## 2017-11-13 ENCOUNTER — Ambulatory Visit: Payer: Medicare Other | Admitting: Family Medicine

## 2017-11-14 ENCOUNTER — Emergency Department (HOSPITAL_COMMUNITY)
Admission: EM | Admit: 2017-11-14 | Discharge: 2017-11-14 | Disposition: A | Discharge status: Home / Self Care | Payer: Medicare Other | Attending: Emergency Medicine | Admitting: Emergency Medicine

## 2017-11-14 ENCOUNTER — Encounter (HOSPITAL_COMMUNITY): Payer: Self-pay | Admitting: Emergency Medicine

## 2017-11-14 ENCOUNTER — Other Ambulatory Visit: Payer: Self-pay

## 2017-11-14 DIAGNOSIS — I120 Hypertensive chronic kidney disease with stage 5 chronic kidney disease or end stage renal disease: Secondary | ICD-10-CM | POA: Insufficient documentation

## 2017-11-14 DIAGNOSIS — Z79899 Other long term (current) drug therapy: Secondary | ICD-10-CM | POA: Diagnosis not present

## 2017-11-14 DIAGNOSIS — N186 End stage renal disease: Secondary | ICD-10-CM | POA: Insufficient documentation

## 2017-11-14 DIAGNOSIS — B029 Zoster without complications: Secondary | ICD-10-CM | POA: Diagnosis not present

## 2017-11-14 DIAGNOSIS — F17210 Nicotine dependence, cigarettes, uncomplicated: Secondary | ICD-10-CM | POA: Diagnosis not present

## 2017-11-14 DIAGNOSIS — Z21 Asymptomatic human immunodeficiency virus [HIV] infection status: Secondary | ICD-10-CM | POA: Diagnosis not present

## 2017-11-14 DIAGNOSIS — R21 Rash and other nonspecific skin eruption: Secondary | ICD-10-CM | POA: Diagnosis present

## 2017-11-14 DIAGNOSIS — Z992 Dependence on renal dialysis: Secondary | ICD-10-CM | POA: Diagnosis not present

## 2017-11-14 NOTE — Discharge Instructions (Signed)
Take your medications as directed. Follow up with your doctor. If you develop fever, or any other symptoms return here.

## 2017-11-14 NOTE — ED Notes (Signed)
Bed: WTR6 Expected date:  Expected time:  Means of arrival:  Comments: 

## 2017-11-14 NOTE — ED Triage Notes (Addendum)
Pt reports having been dx with shingles and now is having pain when sleeping. Pt states that he has finished acyclyvir but is still having pain.

## 2017-11-14 NOTE — ED Notes (Signed)
Pt has been seen for the shingles. He comes in today because they are burning and he can't sleep well. He went to dialysis today and they gave him a prescription of antibiotics.

## 2017-11-14 NOTE — ED Provider Notes (Signed)
[place] DEPT Provider Note   CSN: [id number] Arrival date & time: 11/14/17  1906     History   Chief Complaint Chief Complaint  Patient presents with  . Herpes Zoster    HPI [name] is a 52 y.o. male with hx of HIV, ESRD, on dialysis and multiple other medical problems as listed in HPI presents to the ED with concern because he took his temperature today with the strip that you put on your forehead and read 96. He came to be sure he wasn't coming down with something. Patient reports that he was dx with shingles last week but has been taking his medication as directed. He has had itching and has tried not to scratch the rash. Patient denies cough, congestion, chills, decreased appetite or any other symptoms.   HPI  Past Medical History:  Diagnosis Date  . Anal condyloma   . Anemia   . Diarrhea    occurs because of dialysis  . DJD (degenerative joint disease)    right knee.  All joints  . ESRD (end stage renal disease) on dialysis ([place]) 01/02/2012   Gets diaysis at [facility] on [address], TTS schedule.  Started dialysis in 2000.     . Family history of disseminated HSV infection   . Genital herpes   . Hemodialysis patient [facility])    Tuesday, Thursday, Saturday  . History of blood transfusion   . HIV infection ([place])   . Hypertension   . Pneumonia 1/20107   hx of  . Shortness of breath    "can happen at anytime" (03/04/2013).  03/14/16- "when I have too much fluid"  . Tertiary syphilis   . Thrombosis of dialysis vascular access ([place]) 01/02/2012   RUA AV fistula clotted on 02/27/13, declotted by IR. Reclotted on 03/02/13 and IR placed L IJ tunneled HD catheter (a right external jugular tunneled HD cath was attempted first but did not function due to kinking across the clavicle). Pt was seen by VVS on same admission 6/614 and said RUA AVF no longer usable, they ordered vein mapping and will see him as outpatient to set up next permanent  access.       Patient Active Problem List   Diagnosis Date Noted  . Itching 10/28/2017  . Herpes zoster 10/08/2017  . HIV (human immunodeficiency virus infection) ([place])   . Nonrheumatic aortic valve stenosis   . PUD (peptic ulcer disease)   . Anemia 01/01/2017  . Toe ulcer ([place])   . GI bleed 12/07/2016  . Gastroesophageal reflux disease 09/13/2016  . Annual physical exam 09/13/2016  . Normocytic anemia 10/14/2015  . Thrombocytopenia ([place]) 10/14/2015  . Gall stone   . 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 End stage renal disease on dialysis ([place]) 01/02/2012  . PERIPHERAL NEUROPATHY 04/10/2009  . BACTEREMIA, MYCOBACTERIUM AVIUM COMPLEX 10/19/2006  . HIV disease ([place]) 10/19/2006  . HERPES, GENITAL NEC 10/19/2006  . WARTS, OTHER SPECIFIED VIRAL 10/19/2006  . SYPHILIS NOS 10/19/2006  . CIGARETTE SMOKER 10/19/2006  . Depression 10/19/2006    Past Surgical History:  Procedure Laterality Date  . A/V SHUNTOGRAM Left 04/15/2017   Procedure: A/V Shuntogram;  Surgeon: Waynetta Sandy, MD;  Location: [place] CV LAB;  Service: Cardiovascular;  Laterality: Left;  . APPENDECTOMY    . AV FISTULA PLACEMENT Right 06/16/11  . AV FISTULA PLACEMENT Left 03/17/2016   Procedure: INSERTION OF ARTERIOVENOUS (AV) GORE-TEX GRAFT ARM USING GORETEX 4-7MM X 45CM  STRETCH GRAFT;  Surgeon: Elam Dutch, MD;  Location: [place];  Service: Vascular;  Laterality: Left;  . AV FISTULA PLACEMENT, BRACHIOCEPHALIC Right 10/93/23  . ESOPHAGOGASTRODUODENOSCOPY N/A 12/09/2016   Procedure: ESOPHAGOGASTRODUODENOSCOPY (EGD);  Surgeon: Otis Brace, MD;  Location: [place];  Service: Gastroenterology;  Laterality: N/A;  . ESOPHAGOGASTRODUODENOSCOPY N/A 01/05/2017   Procedure: ESOPHAGOGASTRODUODENOSCOPY (EGD);  Surgeon: Clarene Essex, MD;  Location: [facility] ENDOSCOPY;  Service: Endoscopy;  Laterality: N/A;  . ESOPHAGOGASTRODUODENOSCOPY (EGD) WITH PROPOFOL N/A 01/06/2017   Procedure:  ESOPHAGOGASTRODUODENOSCOPY (EGD) WITH PROPOFOL;  Surgeon: Otis Brace, MD;  Location: [place];  Service: Gastroenterology;  Laterality: N/A;  . INCISION AND DRAINAGE ABSCESS     abdominal  . PERIPHERAL VASCULAR CATHETERIZATION N/A 03/07/2016   Procedure: Venogram;  Surgeon: Elam Dutch, MD;  Location: [place] CV LAB;  Service: Cardiovascular;  Laterality: N/A;  . PERIPHERAL VASCULAR INTERVENTION Left 04/15/2017   Procedure: Peripheral Vascular Intervention;  Surgeon: Waynetta Sandy, MD;  Location: [place] CV LAB;  Service: Cardiovascular;  Laterality: Left;  . REVISON OF ARTERIOVENOUS FISTULA Right 04/20/2013   Procedure: INSERTION OF ARTERIOVENOUS GORTEX GRAFT;  Surgeon: Elam Dutch, MD;  Location: [place];  Service: Vascular;  Laterality: Right;  Ultrasound Guided  . TONSILLECTOMY         Home Medications    Prior to Admission medications   Medication Sig Start Date End Date Taking? Authorizing Provider  acyclovir (ZOVIRAX) 400 MG tablet Take 400 mg tablet in evening after each HD session for the next week. Patient not taking: Reported on 10/28/2017 10/10/17   Nicolette Bang, DO  albuterol (PROVENTIL HFA;VENTOLIN HFA) 108 [phone] Base) MCG/ACT inhaler Inhale 2 puffs into the lungs every 6 (six) hours as needed for wheezing or shortness of breath.    [provider]  calcium acetate (PHOSLO) 667 MG capsule Take 1,334-2,668 mg by mouth See admin instructions. Take 1334 mg by mouth three times a day with meals and 2,668 mg (also) with each snack    [provider]  darunavir (PREZISTA) 800 MG tablet TAKE 1 TABLET BY MOUTH AT BEDTIME WITH NORVIR 10/22/17   Michel Bickers, MD  docusate sodium (COLACE) 100 MG capsule Take 1 capsule (100 mg total) by mouth daily. Patient not taking: Reported on 11/08/2017 12/10/16   Eloise Levels, MD  dolutegravir (TIVICAY) 50 MG tablet Take 1 tablet (50 mg total) by mouth at bedtime. 10/22/17   Michel Bickers, MD  hydrOXYzine (ATARAX/VISTARIL) 25 MG tablet Take 1 tablet (25 mg total) by mouth every 6 (six) hours as needed for itching. 10/28/17   Marana Bing, DO  Multiple Vitamins-Minerals (MULTIVITAMIN PO) Take 1 tablet by mouth daily.    [provider]  pantoprazole (PROTONIX) 40 MG tablet TAKE 1 TABLET BY MOUTH TWICE DAILY 01/08/17   Mercy Riding, MD  ritonavir (NORVIR) 100 MG TABS tablet TAKE 1 TABLET BY MOUTH AT BEDTIME. TAKE WITH PREZISTA TABLET 10/22/17   Michel Bickers, MD  tenofovir (VIREAD) 300 MG tablet TAKE 1 TABLET BY MOUTH ONCE A WEEK ON SUNDAYS AT BEDTIME 10/22/17   Michel Bickers, MD    Family History Family History  Problem Relation Age of Onset  . Diabetes Mother   . Arthritis Father        Gout- Foot    Social History Social History   Tobacco Use  . Smoking status: Heavy Tobacco Smoker    Packs/day: 0.50    Years: 27.00    Pack  years: 13.50    Types: Cigarettes  . Smokeless tobacco: Never Used  Substance Use Topics  . Alcohol use: No    Alcohol/week: 0.0 oz    Comment: 03/04/2013 "stopped drinking ~ 12 yr ago; never had problem w/it".    . Drug use: No     Allergies   Penicillins; Contrast media [iodinated diagnostic agents]; Acetaminophen-codeine; Oxycodone-acetaminophen; and Tape   Review of Systems Review of Systems  Constitutional: Negative for appetite change, chills and fever.  HENT: Negative for congestion.   Respiratory: Negative for cough and shortness of breath.   Cardiovascular: Negative for chest pain.  Gastrointestinal: Negative for abdominal pain, nausea and vomiting.  Musculoskeletal: Negative for back pain and neck stiffness.  Skin: Positive for rash.  Neurological: Negative for headaches.  Psychiatric/Behavioral: Negative for confusion.     Physical Exam Updated Vital Signs BP (!) 145/80 (BP Location: Right Arm)   Pulse 65   Temp (!) 97.5 F (36.4 C) (Oral)   Resp 15   Ht _0  (1.803 m)   Wt 70.3 kg (155 lb)    SpO2 100%   BMI 21.62 kg/m   Physical Exam  Constitutional: He appears well-developed and well-nourished.  Eyes: EOM are normal.  Neck: Neck supple.  Cardiovascular: Normal rate.  Pulmonary/Chest: Effort normal.  Musculoskeletal: Normal range of motion.  Neurological: He is alert.  Skin: Rash noted.  Lesions to the posterior aspect of the left shoulder that extends to the anterior aspect of the shoulder. Lesions appear to be crusting over. Patient c/o itching.   Psychiatric: He has a normal mood and affect.  Nursing note and vitals reviewed.    ED Treatments / Results  Labs (all labs ordered are listed, but only abnormal results are displayed) Labs Reviewed - No data to display  Radiology No results found.  Procedures Procedures (including critical care time)  Medications Ordered in ED Medications - No data to display   Initial Impression / Assessment and Plan / ED Course  I have reviewed the triage vital signs and the nursing notes. 52 y.o. male with healing zoster lesions, without fever or signs of infection stable for d/c. Dr. Vanita Panda in to examine the patient and discussed use of Benadryl hs for itching. Patient agrees with plan. Patient to f/u with PCP. Return precautions discussed.   Final Clinical Impressions(s) / ED Diagnoses   Final diagnoses:  Herpes zoster without complication    ED Discharge Orders    None       Debroah Baller [place], [place] 11/14/17 2020    Carmin Muskrat, MD 11/14/17 2332

## 2017-11-18 ENCOUNTER — Ambulatory Visit: Payer: Medicare Other | Admitting: Student

## 2017-11-19 ENCOUNTER — Encounter: Payer: Self-pay | Admitting: Internal Medicine

## 2018-01-06 ENCOUNTER — Encounter: Payer: Self-pay | Admitting: Physician Assistant

## 2018-02-07 ENCOUNTER — Other Ambulatory Visit: Payer: Self-pay | Admitting: Internal Medicine

## 2018-02-07 DIAGNOSIS — B20 Human immunodeficiency virus [HIV] disease: Secondary | ICD-10-CM

## 2018-03-16 ENCOUNTER — Ambulatory Visit: Payer: Medicare Other | Admitting: Internal Medicine

## 2018-03-24 ENCOUNTER — Ambulatory Visit (INDEPENDENT_AMBULATORY_CARE_PROVIDER_SITE_OTHER): Payer: Medicare Other | Admitting: Orthopaedic Surgery

## 2018-03-26 ENCOUNTER — Ambulatory Visit (INDEPENDENT_AMBULATORY_CARE_PROVIDER_SITE_OTHER): Payer: Medicare Other | Admitting: Orthopaedic Surgery

## 2018-03-26 ENCOUNTER — Ambulatory Visit (INDEPENDENT_AMBULATORY_CARE_PROVIDER_SITE_OTHER): Payer: Medicare Other

## 2018-03-26 DIAGNOSIS — G8929 Other chronic pain: Secondary | ICD-10-CM

## 2018-03-26 DIAGNOSIS — M25512 Pain in left shoulder: Secondary | ICD-10-CM

## 2018-03-26 NOTE — Progress Notes (Signed)
Office Visit Note   Patient: [name]           Date of Birth: [birth year]           MRN: [id number] Visit Date: 03/26/2018              Requested by: No referring provider defined for this encounter. PCP: Patient, No Pcp Per   Assessment & Plan: Visit Diagnoses:  1. Chronic left shoulder pain     Plan: Impression is shingles to the C6 dermatome on the left side.  At this point, we will refer the patient back to his primary care provider.  If they are unable to see him today, he was instructed to follow-up with an urgent care.  He will follow-up with [place] as needed.  Follow-Up Instructions: Return if symptoms worsen or fail to improve.   Orders:  Orders Placed This Encounter  Procedures  . XR Shoulder Left   No orders of the defined types were placed in this encounter.     Procedures: No procedures performed   Clinical Data: No additional findings.   Subjective: Chief Complaint  Patient presents with  . Left Shoulder - Pain    HPI patient is a pleasant 52 year old gentleman who presents to our clinic today with pain to the top of the left shoulder and into the posterior lateral aspect of the neck.  This began approximately 2 months ago without any known injury or change in activity.  He does note that he had shingles in this area about 4 months ago.  He was seen by his primary care provider for this where he was given medication.  This seemed to initially improve.  The pain he has is constant but worse with any palpation to the area.  He has not noticed any blisters or any other type of recent rash.  No fevers or chills.   Review of Systems as detailed in HPI.  All others reviewed and are negative.   Objective: Vital Signs: There were no vitals taken for this visit.  Physical Exam well-developed well-nourished gentleman in no acute distress.  Alert and oriented x3.  Ortho Exam examination of the left shoulder reveals marked tenderness along the C6  dermatome specifically on the left side.  No blisters but a questionable rash.  Range of motion of the shoulder elicits minimal pain to this area.  Specialty Comments:  No specialty comments available.  Imaging: Xr Shoulder Left  Result Date: 03/26/2018 No acute or structural abnormalities    PMFS History: Patient Active Problem List   Diagnosis Date Noted  . Itching 10/28/2017  . Herpes zoster 10/08/2017  . HIV (human immunodeficiency virus infection) ([place])   . Nonrheumatic aortic valve stenosis   . PUD (peptic ulcer disease)   . Anemia 01/01/2017  . Toe ulcer ([place])   . GI bleed 12/07/2016  . Gastroesophageal reflux disease 09/13/2016  . Annual physical exam 09/13/2016  . Normocytic anemia 10/14/2015  . Thrombocytopenia ([place]) 10/14/2015  . Gall stone   . 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 End stage renal disease on dialysis ([place]) 01/02/2012  . PERIPHERAL NEUROPATHY 04/10/2009  . BACTEREMIA, MYCOBACTERIUM AVIUM COMPLEX 10/19/2006  . HIV disease ([place]) 10/19/2006  . HERPES, GENITAL NEC 10/19/2006  . WARTS, OTHER SPECIFIED VIRAL 10/19/2006  . SYPHILIS NOS 10/19/2006  . CIGARETTE SMOKER 10/19/2006  . Depression 10/19/2006   Past Medical History:  Diagnosis Date  . Anal condyloma   . Anemia   .  Diarrhea    occurs because of dialysis  . DJD (degenerative joint disease)    right knee.  All joints  . ESRD (end stage renal disease) on dialysis ([place]) 01/02/2012   Gets diaysis at [facility] on [employer], TTS schedule.  Started dialysis in 2000.     . Family history of disseminated HSV infection   . Genital herpes   . Hemodialysis patient [facility])    Tuesday, Thursday, Saturday  . History of blood transfusion   . HIV infection ([place])   . Hypertension   . Pneumonia 1/20107   hx of  . Shortness of breath    "can happen at anytime" (03/04/2013).  03/14/16- "when I have too much fluid"  . Tertiary syphilis   . Thrombosis of dialysis vascular access ([place])  01/02/2012   RUA AV fistula clotted on 02/27/13, declotted by IR. Reclotted on 03/02/13 and IR placed L IJ tunneled HD catheter (a right external jugular tunneled HD cath was attempted first but did not function due to kinking across the clavicle). Pt was seen by VVS on same admission 6/614 and said RUA AVF no longer usable, they ordered vein mapping and will see him as outpatient to set up next permanent access.       Family History  Problem Relation Age of Onset  . Diabetes Mother   . Arthritis Father        Gout- Foot    Past Surgical History:  Procedure Laterality Date  . A/V SHUNTOGRAM Left 04/15/2017   Procedure: A/V Shuntogram;  Surgeon: Waynetta Sandy, MD;  Location: [place] [facility];  Service: Cardiovascular;  Laterality: Left;  . APPENDECTOMY    . AV FISTULA PLACEMENT Right 06/16/11  . AV FISTULA PLACEMENT Left 03/17/2016   Procedure: INSERTION OF ARTERIOVENOUS (AV) GORE-TEX GRAFT ARM USING GORETEX 4-7MM X 45CM STRETCH GRAFT;  Surgeon: Elam Dutch, MD;  Location: [place];  Service: Vascular;  Laterality: Left;  . AV FISTULA PLACEMENT, BRACHIOCEPHALIC Right 17/49/44  . ESOPHAGOGASTRODUODENOSCOPY N/A 12/09/2016   Procedure: ESOPHAGOGASTRODUODENOSCOPY (EGD);  Surgeon: Otis Brace, MD;  Location: [place];  Service: Gastroenterology;  Laterality: N/A;  . ESOPHAGOGASTRODUODENOSCOPY N/A 01/05/2017   Procedure: ESOPHAGOGASTRODUODENOSCOPY (EGD);  Surgeon: Clarene Essex, MD;  Location: [facility] ENDOSCOPY;  Service: Endoscopy;  Laterality: N/A;  . ESOPHAGOGASTRODUODENOSCOPY (EGD) WITH PROPOFOL N/A 01/06/2017   Procedure: ESOPHAGOGASTRODUODENOSCOPY (EGD) WITH PROPOFOL;  Surgeon: Otis Brace, MD;  Location: [place];  Service: Gastroenterology;  Laterality: N/A;  . INCISION AND DRAINAGE ABSCESS     abdominal  . PERIPHERAL VASCULAR CATHETERIZATION N/A 03/07/2016   Procedure: Venogram;  Surgeon: Elam Dutch, MD;  Location: [place] [facility];  Service: Cardiovascular;   Laterality: N/A;  . PERIPHERAL VASCULAR INTERVENTION Left 04/15/2017   Procedure: Peripheral Vascular Intervention;  Surgeon: Waynetta Sandy, MD;  Location: [place] [facility];  Service: Cardiovascular;  Laterality: Left;  . REVISON OF ARTERIOVENOUS FISTULA Right 04/20/2013   Procedure: INSERTION OF ARTERIOVENOUS GORTEX GRAFT;  Surgeon: Elam Dutch, MD;  Location: [place];  Service: Vascular;  Laterality: Right;  Ultrasound Guided  . TONSILLECTOMY     Social History   Occupational History  . Not on file  Tobacco Use  . Smoking status: Heavy Tobacco Smoker    Packs/day: 0.50    Years: 27.00    Pack years: 13.50    Types: Cigarettes  . Smokeless tobacco: Never Used  Substance and Sexual Activity  . Alcohol use: No    Alcohol/week:  0.0 oz    Comment: 03/04/2013 "stopped drinking ~ 12 yr ago; never had problem w/it".    . Drug use: No  . Sexual activity: Not Currently    Partners: Female    Birth control/protection: Condom    Comment: declined condoms

## 2018-05-21 ENCOUNTER — Encounter (HOSPITAL_COMMUNITY): Payer: Self-pay

## 2018-05-21 ENCOUNTER — Inpatient Hospital Stay (HOSPITAL_COMMUNITY)
Admission: EM | Admit: 2018-05-21 | Discharge: 2018-06-29 | DRG: 246 | Disposition: E | Payer: Medicare Other | Attending: Pulmonary Disease | Admitting: Pulmonary Disease

## 2018-05-21 ENCOUNTER — Other Ambulatory Visit: Payer: Self-pay

## 2018-05-21 ENCOUNTER — Emergency Department (HOSPITAL_COMMUNITY): Payer: Medicare Other

## 2018-05-21 DIAGNOSIS — J432 Centrilobular emphysema: Secondary | ICD-10-CM | POA: Diagnosis present

## 2018-05-21 DIAGNOSIS — D631 Anemia in chronic kidney disease: Secondary | ICD-10-CM | POA: Diagnosis present

## 2018-05-21 DIAGNOSIS — B20 Human immunodeficiency virus [HIV] disease: Secondary | ICD-10-CM | POA: Diagnosis not present

## 2018-05-21 DIAGNOSIS — E785 Hyperlipidemia, unspecified: Secondary | ICD-10-CM | POA: Diagnosis present

## 2018-05-21 DIAGNOSIS — Z7189 Other specified counseling: Secondary | ICD-10-CM

## 2018-05-21 DIAGNOSIS — K567 Ileus, unspecified: Secondary | ICD-10-CM

## 2018-05-21 DIAGNOSIS — N179 Acute kidney failure, unspecified: Secondary | ICD-10-CM | POA: Diagnosis not present

## 2018-05-21 DIAGNOSIS — D696 Thrombocytopenia, unspecified: Secondary | ICD-10-CM | POA: Diagnosis present

## 2018-05-21 DIAGNOSIS — Z9114 Patient's other noncompliance with medication regimen: Secondary | ICD-10-CM

## 2018-05-21 DIAGNOSIS — Z9115 Patient's noncompliance with renal dialysis: Secondary | ICD-10-CM

## 2018-05-21 DIAGNOSIS — E8889 Other specified metabolic disorders: Secondary | ICD-10-CM | POA: Diagnosis present

## 2018-05-21 DIAGNOSIS — I469 Cardiac arrest, cause unspecified: Secondary | ICD-10-CM

## 2018-05-21 DIAGNOSIS — Z79899 Other long term (current) drug therapy: Secondary | ICD-10-CM

## 2018-05-21 DIAGNOSIS — R64 Cachexia: Secondary | ICD-10-CM | POA: Diagnosis present

## 2018-05-21 DIAGNOSIS — Z831 Family history of other infectious and parasitic diseases: Secondary | ICD-10-CM

## 2018-05-21 DIAGNOSIS — R570 Cardiogenic shock: Secondary | ICD-10-CM

## 2018-05-21 DIAGNOSIS — J438 Other emphysema: Secondary | ICD-10-CM | POA: Diagnosis present

## 2018-05-21 DIAGNOSIS — E876 Hypokalemia: Secondary | ICD-10-CM | POA: Diagnosis not present

## 2018-05-21 DIAGNOSIS — I5043 Acute on chronic combined systolic (congestive) and diastolic (congestive) heart failure: Secondary | ICD-10-CM | POA: Diagnosis not present

## 2018-05-21 DIAGNOSIS — Z01810 Encounter for preprocedural cardiovascular examination: Secondary | ICD-10-CM | POA: Diagnosis not present

## 2018-05-21 DIAGNOSIS — I82B11 Acute embolism and thrombosis of right subclavian vein: Secondary | ICD-10-CM | POA: Diagnosis present

## 2018-05-21 DIAGNOSIS — I350 Nonrheumatic aortic (valve) stenosis: Secondary | ICD-10-CM

## 2018-05-21 DIAGNOSIS — Z9289 Personal history of other medical treatment: Secondary | ICD-10-CM

## 2018-05-21 DIAGNOSIS — J189 Pneumonia, unspecified organism: Secondary | ICD-10-CM

## 2018-05-21 DIAGNOSIS — I472 Ventricular tachycardia: Secondary | ICD-10-CM | POA: Diagnosis present

## 2018-05-21 DIAGNOSIS — J690 Pneumonitis due to inhalation of food and vomit: Secondary | ICD-10-CM | POA: Diagnosis not present

## 2018-05-21 DIAGNOSIS — Z01818 Encounter for other preprocedural examination: Secondary | ICD-10-CM

## 2018-05-21 DIAGNOSIS — I442 Atrioventricular block, complete: Secondary | ICD-10-CM | POA: Diagnosis not present

## 2018-05-21 DIAGNOSIS — Z91013 Allergy to seafood: Secondary | ICD-10-CM

## 2018-05-21 DIAGNOSIS — T829XXA Unspecified complication of cardiac and vascular prosthetic device, implant and graft, initial encounter: Secondary | ICD-10-CM

## 2018-05-21 DIAGNOSIS — J9601 Acute respiratory failure with hypoxia: Secondary | ICD-10-CM | POA: Diagnosis not present

## 2018-05-21 DIAGNOSIS — Z86718 Personal history of other venous thrombosis and embolism: Secondary | ICD-10-CM

## 2018-05-21 DIAGNOSIS — Z8261 Family history of arthritis: Secondary | ICD-10-CM

## 2018-05-21 DIAGNOSIS — Z66 Do not resuscitate: Secondary | ICD-10-CM | POA: Diagnosis present

## 2018-05-21 DIAGNOSIS — Z794 Long term (current) use of insulin: Secondary | ICD-10-CM

## 2018-05-21 DIAGNOSIS — I5023 Acute on chronic systolic (congestive) heart failure: Secondary | ICD-10-CM | POA: Diagnosis not present

## 2018-05-21 DIAGNOSIS — G92 Toxic encephalopathy: Secondary | ICD-10-CM | POA: Diagnosis not present

## 2018-05-21 DIAGNOSIS — J9602 Acute respiratory failure with hypercapnia: Secondary | ICD-10-CM | POA: Diagnosis not present

## 2018-05-21 DIAGNOSIS — I452 Bifascicular block: Secondary | ICD-10-CM | POA: Diagnosis present

## 2018-05-21 DIAGNOSIS — I429 Cardiomyopathy, unspecified: Secondary | ICD-10-CM | POA: Diagnosis present

## 2018-05-21 DIAGNOSIS — I352 Nonrheumatic aortic (valve) stenosis with insufficiency: Secondary | ICD-10-CM | POA: Diagnosis present

## 2018-05-21 DIAGNOSIS — Z9049 Acquired absence of other specified parts of digestive tract: Secondary | ICD-10-CM

## 2018-05-21 DIAGNOSIS — E1165 Type 2 diabetes mellitus with hyperglycemia: Secondary | ICD-10-CM | POA: Diagnosis present

## 2018-05-21 DIAGNOSIS — G931 Anoxic brain damage, not elsewhere classified: Secondary | ICD-10-CM | POA: Diagnosis not present

## 2018-05-21 DIAGNOSIS — Z992 Dependence on renal dialysis: Secondary | ICD-10-CM

## 2018-05-21 DIAGNOSIS — E11649 Type 2 diabetes mellitus with hypoglycemia without coma: Secondary | ICD-10-CM | POA: Diagnosis present

## 2018-05-21 DIAGNOSIS — Z7982 Long term (current) use of aspirin: Secondary | ICD-10-CM

## 2018-05-21 DIAGNOSIS — Z7902 Long term (current) use of antithrombotics/antiplatelets: Secondary | ICD-10-CM

## 2018-05-21 DIAGNOSIS — E875 Hyperkalemia: Secondary | ICD-10-CM | POA: Diagnosis present

## 2018-05-21 DIAGNOSIS — K279 Peptic ulcer, site unspecified, unspecified as acute or chronic, without hemorrhage or perforation: Secondary | ICD-10-CM | POA: Diagnosis present

## 2018-05-21 DIAGNOSIS — E440 Moderate protein-calorie malnutrition: Secondary | ICD-10-CM

## 2018-05-21 DIAGNOSIS — I272 Pulmonary hypertension, unspecified: Secondary | ICD-10-CM | POA: Diagnosis present

## 2018-05-21 DIAGNOSIS — Z833 Family history of diabetes mellitus: Secondary | ICD-10-CM

## 2018-05-21 DIAGNOSIS — N186 End stage renal disease: Secondary | ICD-10-CM | POA: Diagnosis not present

## 2018-05-21 DIAGNOSIS — G629 Polyneuropathy, unspecified: Secondary | ICD-10-CM | POA: Diagnosis present

## 2018-05-21 DIAGNOSIS — I132 Hypertensive heart and chronic kidney disease with heart failure and with stage 5 chronic kidney disease, or end stage renal disease: Secondary | ICD-10-CM | POA: Diagnosis present

## 2018-05-21 DIAGNOSIS — Z590 Homelessness: Secondary | ICD-10-CM

## 2018-05-21 DIAGNOSIS — I214 Non-ST elevation (NSTEMI) myocardial infarction: Secondary | ICD-10-CM

## 2018-05-21 DIAGNOSIS — H11009 Unspecified pterygium of unspecified eye: Secondary | ICD-10-CM | POA: Diagnosis present

## 2018-05-21 DIAGNOSIS — J9690 Respiratory failure, unspecified, unspecified whether with hypoxia or hypercapnia: Secondary | ICD-10-CM

## 2018-05-21 DIAGNOSIS — I351 Nonrheumatic aortic (valve) insufficiency: Secondary | ICD-10-CM | POA: Diagnosis not present

## 2018-05-21 DIAGNOSIS — N2581 Secondary hyperparathyroidism of renal origin: Secondary | ICD-10-CM | POA: Diagnosis present

## 2018-05-21 DIAGNOSIS — I998 Other disorder of circulatory system: Secondary | ICD-10-CM | POA: Diagnosis not present

## 2018-05-21 DIAGNOSIS — Z880 Allergy status to penicillin: Secondary | ICD-10-CM

## 2018-05-21 DIAGNOSIS — Z21 Asymptomatic human immunodeficiency virus [HIV] infection status: Secondary | ICD-10-CM | POA: Diagnosis not present

## 2018-05-21 DIAGNOSIS — R579 Shock, unspecified: Secondary | ICD-10-CM | POA: Diagnosis not present

## 2018-05-21 DIAGNOSIS — Z515 Encounter for palliative care: Secondary | ICD-10-CM | POA: Diagnosis not present

## 2018-05-21 DIAGNOSIS — K219 Gastro-esophageal reflux disease without esophagitis: Secondary | ICD-10-CM | POA: Diagnosis present

## 2018-05-21 DIAGNOSIS — Z9911 Dependence on respirator [ventilator] status: Secondary | ICD-10-CM

## 2018-05-21 DIAGNOSIS — Z9119 Patient's noncompliance with other medical treatment and regimen: Secondary | ICD-10-CM

## 2018-05-21 DIAGNOSIS — F17210 Nicotine dependence, cigarettes, uncomplicated: Secondary | ICD-10-CM | POA: Diagnosis present

## 2018-05-21 DIAGNOSIS — E860 Dehydration: Secondary | ICD-10-CM | POA: Diagnosis present

## 2018-05-21 DIAGNOSIS — E1151 Type 2 diabetes mellitus with diabetic peripheral angiopathy without gangrene: Secondary | ICD-10-CM | POA: Diagnosis present

## 2018-05-21 DIAGNOSIS — E1122 Type 2 diabetes mellitus with diabetic chronic kidney disease: Secondary | ICD-10-CM | POA: Diagnosis present

## 2018-05-21 DIAGNOSIS — I700 Atherosclerosis of aorta: Secondary | ICD-10-CM | POA: Diagnosis present

## 2018-05-21 DIAGNOSIS — I2510 Atherosclerotic heart disease of native coronary artery without angina pectoris: Secondary | ICD-10-CM | POA: Diagnosis present

## 2018-05-21 DIAGNOSIS — M1711 Unilateral primary osteoarthritis, right knee: Secondary | ICD-10-CM | POA: Diagnosis present

## 2018-05-21 DIAGNOSIS — Z955 Presence of coronary angioplasty implant and graft: Secondary | ICD-10-CM

## 2018-05-21 DIAGNOSIS — Z681 Body mass index (BMI) 19 or less, adult: Secondary | ICD-10-CM

## 2018-05-21 DIAGNOSIS — Z888 Allergy status to other drugs, medicaments and biological substances status: Secondary | ICD-10-CM

## 2018-05-21 DIAGNOSIS — Z950 Presence of cardiac pacemaker: Secondary | ICD-10-CM

## 2018-05-21 DIAGNOSIS — R079 Chest pain, unspecified: Secondary | ICD-10-CM | POA: Diagnosis not present

## 2018-05-21 DIAGNOSIS — Z885 Allergy status to narcotic agent status: Secondary | ICD-10-CM

## 2018-05-21 DIAGNOSIS — J811 Chronic pulmonary edema: Secondary | ICD-10-CM

## 2018-05-21 DIAGNOSIS — Z8619 Personal history of other infectious and parasitic diseases: Secondary | ICD-10-CM

## 2018-05-21 DIAGNOSIS — Z91048 Other nonmedicinal substance allergy status: Secondary | ICD-10-CM

## 2018-05-21 HISTORY — DX: Atherosclerotic heart disease of native coronary artery without angina pectoris: I25.10

## 2018-05-21 HISTORY — DX: Nonrheumatic aortic (valve) stenosis: I35.0

## 2018-05-21 HISTORY — DX: Non-ST elevation (NSTEMI) myocardial infarction: I21.4

## 2018-05-21 LAB — MRSA PCR SCREENING: MRSA BY PCR: NEGATIVE

## 2018-05-21 LAB — CBC
HEMATOCRIT: 35.1 % — AB (ref 39.0–52.0)
Hemoglobin: 11 g/dL — ABNORMAL LOW (ref 13.0–17.0)
MCH: 34.2 pg — ABNORMAL HIGH (ref 26.0–34.0)
MCHC: 31.3 g/dL (ref 30.0–36.0)
MCV: 109 fL — AB (ref 78.0–100.0)
Platelets: 78 10*3/uL — ABNORMAL LOW (ref 150–400)
RBC: 3.22 MIL/uL — ABNORMAL LOW (ref 4.22–5.81)
RDW: 17.9 % — AB (ref 11.5–15.5)
WBC: 6.1 10*3/uL (ref 4.0–10.5)

## 2018-05-21 LAB — BASIC METABOLIC PANEL
Anion gap: 13 (ref 5–15)
BUN: 24 mg/dL — AB (ref 6–20)
CO2: 25 mmol/L (ref 22–32)
Calcium: 9.9 mg/dL (ref 8.9–10.3)
Chloride: 102 mmol/L (ref 98–111)
Creatinine, Ser: 8.01 mg/dL — ABNORMAL HIGH (ref 0.61–1.24)
GFR calc Af Amer: 8 mL/min — ABNORMAL LOW (ref >60)
GFR calc non Af Amer: 7 mL/min — ABNORMAL LOW (ref >60)
GLUCOSE: 79 mg/dL (ref 70–99)
POTASSIUM: 4.6 mmol/L (ref 3.5–5.1)
Sodium: 140 mmol/L (ref 135–145)

## 2018-05-21 LAB — I-STAT TROPONIN, ED: Troponin i, poc: 0.86 ng/mL (ref 0.00–0.08)

## 2018-05-21 LAB — TROPONIN I: TROPONIN I: 1.31 ng/mL — AB (ref <0.03)

## 2018-05-21 LAB — HEMOGLOBIN A1C
Hgb A1c MFr Bld: 4.5 % — ABNORMAL LOW (ref 4.8–5.6)
MEAN PLASMA GLUCOSE: 82.45 mg/dL

## 2018-05-21 LAB — MAGNESIUM: Magnesium: 2.2 mg/dL (ref 1.7–2.4)

## 2018-05-21 LAB — BRAIN NATRIURETIC PEPTIDE

## 2018-05-21 LAB — TSH: TSH: 1.763 u[IU]/mL (ref 0.350–4.500)

## 2018-05-21 MED ORDER — ONDANSETRON HCL 4 MG/2ML IJ SOLN: 4.0000 mg | Freq: Four times a day (QID) | INTRAMUSCULAR | Status: DC | PRN

## 2018-05-21 MED ORDER — TENOFOVIR DISOPROXIL FUMARATE 300 MG PO TABS
300.0000 mg | ORAL_TABLET | Freq: Once | ORAL | Status: AC
  Administered 2018-05-23: 300 mg via ORAL
  Filled 2018-05-21: qty 1

## 2018-05-21 MED ORDER — NITROGLYCERIN IN D5W 200-5 MCG/ML-% IV SOLN
0.0000 ug/min | INTRAVENOUS | Status: DC
  Administered 2018-05-21: 5 ug/min via INTRAVENOUS
  Filled 2018-05-21: qty 250

## 2018-05-21 MED ORDER — ACETAMINOPHEN 325 MG PO TABS
650.0000 mg | ORAL_TABLET | Freq: Four times a day (QID) | ORAL | Status: DC | PRN
  Administered 2018-05-22 – 2018-05-24 (×3): 650 mg via ORAL
  Filled 2018-05-21 (×3): qty 2

## 2018-05-21 MED ORDER — METOPROLOL TARTRATE 12.5 MG HALF TABLET
12.5000 mg | ORAL_TABLET | Freq: Two times a day (BID) | ORAL | Status: DC
  Administered 2018-05-21 – 2018-06-02 (×13): 12.5 mg via ORAL
  Filled 2018-05-21 (×23): qty 1

## 2018-05-21 MED ORDER — RITONAVIR 100 MG PO TABS
100.0000 mg | ORAL_TABLET | Freq: Every day | ORAL | Status: DC
  Administered 2018-05-21 – 2018-05-22 (×2): 100 mg via ORAL
  Filled 2018-05-21 (×2): qty 1

## 2018-05-21 MED ORDER — ASPIRIN 81 MG PO CHEW
324.0000 mg | CHEWABLE_TABLET | Freq: Once | ORAL | Status: AC
  Administered 2018-05-21: 324 mg via ORAL
  Filled 2018-05-21: qty 4

## 2018-05-21 MED ORDER — HEPARIN (PORCINE) IN NACL 100-0.45 UNIT/ML-% IJ SOLN
1200.0000 [IU]/h | INTRAMUSCULAR | Status: DC
  Administered 2018-05-21: 800 [IU]/h via INTRAVENOUS
  Administered 2018-05-22: 1200 [IU]/h via INTRAVENOUS
  Filled 2018-05-21 (×3): qty 250

## 2018-05-21 MED ORDER — ASPIRIN 81 MG PO CHEW
81.0000 mg | CHEWABLE_TABLET | Freq: Every day | ORAL | Status: DC
  Administered 2018-05-22 – 2018-05-23 (×2): 81 mg via ORAL
  Filled 2018-05-21 (×3): qty 1

## 2018-05-21 MED ORDER — ROSUVASTATIN CALCIUM 10 MG PO TABS
5.0000 mg | ORAL_TABLET | Freq: Every day | ORAL | Status: DC
Start: 2018-05-22 — End: 2018-06-02
  Administered 2018-05-23 – 2018-06-01 (×10): 5 mg via ORAL
  Filled 2018-05-21 (×10): qty 1

## 2018-05-21 MED ORDER — ROSUVASTATIN CALCIUM 10 MG PO TABS: 10.0000 mg | ORAL_TABLET | Freq: Every day | ORAL | Status: DC

## 2018-05-21 MED ORDER — DOLUTEGRAVIR SODIUM 50 MG PO TABS
50.0000 mg | ORAL_TABLET | Freq: Every day | ORAL | Status: DC
  Administered 2018-05-21 – 2018-05-22 (×2): 50 mg via ORAL
  Filled 2018-05-21 (×2): qty 1

## 2018-05-21 MED ORDER — DARUNAVIR ETHANOLATE 800 MG PO TABS
800.0000 mg | ORAL_TABLET | Freq: Every day | ORAL | Status: DC
  Administered 2018-05-21 – 2018-05-22 (×2): 800 mg via ORAL
  Filled 2018-05-21 (×2): qty 1

## 2018-05-21 MED ORDER — ATORVASTATIN CALCIUM 40 MG PO TABS: 40.0000 mg | ORAL_TABLET | Freq: Every day | ORAL | Status: DC

## 2018-05-21 NOTE — Consult Note (Addendum)
Cardiology Consult    Patient ID: [name] MRN: [id number], DOB/AGE: 06/07/1966   Admit date: 05/27/2018 Date of Consult: 05/27/18  Primary Physician: Patient, No Pcp Per Primary Cardiologist: New - seen by M. Shanikqua Zarzycki, MD  Requesting Provider:  C. Dareen Piano, MD  Patient Profile    [name] is a 52 y.o. male with a history of ESRD, HIV, hypertension, syphilis, and herpes, who is being seen today for the evaluation of non-STEMI at the request of Dr. Dareen Piano.  Past Medical History   Past Medical History:  Diagnosis Date  . Anal condyloma   . Anemia   . Aortic stenosis    a. 12/2016 Echo: EF 55-60%, no rwma, possibly bicuspid AoV, mod thickened, mod Ca2+. Fusion of R-L coronary commissure. Cusp separatino mod reduced. Mild AI. Peak velocity (S) 245 cm/s. Mean gradient (S) .   . Diarrhea    occurs because of dialysis  . DJD (degenerative joint disease)    right knee.  All joints  . ESRD (end stage renal disease) on dialysis ([facility]) 01/02/2012   Gets diaysis at [facility] on [employer], TTS schedule.  Started dialysis in 2000.     . Family history of disseminated HSV infection   . Genital herpes   . Hemodialysis patient [facility])    Tuesday, Thursday, Saturday  . History of blood transfusion   . HIV infection ([facility])   . Hypertension   . Pneumonia 1/20107   hx of  . Shortness of breath    "can happen at anytime" (03/04/2013).  03/14/16- "when I have too much fluid"  . Tertiary syphilis   . 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 Reclotted on 03/02/13 and IR placed L IJ tunneled HD catheter (a right external jugular tunneled HD cath was attempted first but did not function due to kinking across the clavicle). Pt was seen by VVS on same admission 6/614 and said RUA AVF no longer usable, they ordered vein mapping and will see him as outpatient to set up next permanent access.       Past Surgical  History:  Procedure Laterality Date  . A/V SHUNTOGRAM Left 04/15/2017   Procedure: A/V Shuntogram;  Surgeon: Maeola Harman, MD;  Location: [facility] INVASIVE CV LAB;  Service: Cardiovascular;  Laterality: Left;  . APPENDECTOMY    . AV FISTULA PLACEMENT Right 06/16/11  . AV FISTULA PLACEMENT Left 03/17/2016   Procedure: INSERTION OF ARTERIOVENOUS (AV) GORE-TEX GRAFT ARM USING GORETEX 4-7MM X 45CM STRETCH GRAFT;  Surgeon: Sherren Kerns, MD;  Location: [facility] OR;  Service: Vascular;  Laterality: Left;  . AV FISTULA PLACEMENT, BRACHIOCEPHALIC Right 06/16/11  . ESOPHAGOGASTRODUODENOSCOPY N/A 12/09/2016   Procedure: ESOPHAGOGASTRODUODENOSCOPY (EGD);  Surgeon: Kathi Der, MD;  Location: [facility] ENDOSCOPY;  Service: Gastroenterology;  Laterality: N/A;  . ESOPHAGOGASTRODUODENOSCOPY N/A 01/05/2017   Procedure: ESOPHAGOGASTRODUODENOSCOPY (EGD);  Surgeon: Vida Rigger, MD;  Location: [facility] ENDOSCOPY;  Service: Endoscopy;  Laterality: N/A;  . ESOPHAGOGASTRODUODENOSCOPY (EGD) WITH PROPOFOL N/A 01/06/2017   Procedure: ESOPHAGOGASTRODUODENOSCOPY (EGD) WITH PROPOFOL;  Surgeon: Kathi Der, MD;  Location: [facility] ENDOSCOPY;  Service: Gastroenterology;  Laterality: N/A;  . INCISION AND DRAINAGE ABSCESS     abdominal  . PERIPHERAL VASCULAR CATHETERIZATION N/A 03/07/2016   Procedure: Venogram;  Surgeon: Sherren Kerns, MD;  Location: [facility] INVASIVE CV LAB;  Service: Cardiovascular;  Laterality: N/A;  . PERIPHERAL VASCULAR INTERVENTION Left 04/15/2017   Procedure: Peripheral Vascular Intervention;  Surgeon: Maeola Harmanain, Brandon Christopher, MD;  [facility];  Service: Cardiovascular;  Laterality: Left;  . REVISON OF ARTERIOVENOUS FISTULA Right 04/20/2013   Procedure: INSERTION OF ARTERIOVENOUS GORTEX GRAFT;  Surgeon: Sherren Kernsharles E Fields, MD;  [facility];  Service: Vascular;  Laterality: Right;  Ultrasound Guided  . TONSILLECTOMY       Allergies  Allergies  Allergen Reactions  . Iodinated Diagnostic Agents  Hives, Itching and Rash    "skin rash - severe" per outside records  . Penicillins Hives and Other (See Comments)    Rx required Hospitalization Has patient had a PCN reaction causing immediate rash, facial/tongue/throat swelling, SOB or lightheadedness with hypotension: Yes Has patient had a PCN reaction causing severe rash involving mucus membranes or skin necrosis: No Has patient had a PCN reaction that required hospitalization Yes Has patient had a PCN reaction occurring within the last 10 years: No   . Shellfish-Derived Products Nausea Only    Irritates the stomach  . Acetaminophen-Codeine Nausea And Vomiting  . Tape Rash and Other (See Comments)    Use paper tape only (NO CLEAR PLASTIC TAPE!!)    History of Present Illness    52 year old male with a history of end-stage renal disease on dialysis x19 years, HIV, hypertension, syphilis, herpes, anemia, and DJD.  He denies any prior cardiac history and has never undergone ischemic evaluation.  He says he was in his usual state of health until this past Monday, when he noticed increased bloating with abdominal and mild chest discomfort which she associates with increased volume.  When he went to dialysis on Tuesday, he asked them to remove more fluid to drop his dry weight however, he still felt bloated postdialysis.  In the setting of feeling bloated, he noted some dyspnea on exertion.  This persisted into Wednesday and Thursday morning.  He went to dialysis yesterday morning and after the session, he felt much better.  He felt as though his volume status had significantly improved and he was no longer having bloating.  This morning, shortly after awakening, he had substernal chest pressure which was different from what he experiences with volume excess and bloating.  This occurred with exertion and was associated with dyspnea.  Symptoms lasted about 10 minutes and resolved with rest.  He had recurrent exertional chest pain and dyspnea when  walking later in the day and as result, his wife brought him to the emergency department.  Here, ECG shows LVH with lateral T wave inversion.  His initial point-of-care troponin was normal however follow-up troponin was elevated at 0.86.  He was admitted and placed on heparin and we have been asked to evaluate.  He is currently chest pain-free.  Inpatient Medications    . darunavir  800 mg Oral QHS  . dolutegravir  50 mg Oral QHS  . ritonavir  100 mg Oral QHS  . [START ON 05/23/2018] tenofovir  300 mg Oral Once    Family History    Family History  Problem Relation Age of Onset  . Diabetes Mother   . Arthritis Father        Gout- Foot   He indicated that his mother is alive. He indicated that his father is alive.   Social History    Social History   Socioeconomic History  . Marital status: Married    Spouse name: Not on file  . Number of children: Not on file  . Years of education: Not on file  . Highest  education level: Not on file  Occupational History  . Not on file  Social Needs  . Financial resource strain: Not on file  . Food insecurity:    Worry: Not on file    Inability: Not on file  . Transportation needs:    Medical: Not on file    Non-medical: Not on file  Tobacco Use  . Smoking status: Heavy Tobacco Smoker    Packs/day: 0.50    Years: 27.00    Pack years: 13.50    Types: Cigarettes  . Smokeless tobacco: Never Used  . Tobacco comment: currently smoking ~ 1/3 ppd.  Substance and Sexual Activity  . Alcohol use: No    Alcohol/week: 0.0 standard drinks    Comment: 03/04/2013 "stopped drinking ~ 12 yr ago; never had problem w/it".    . Drug use: No  . Sexual activity: Not Currently    Partners: Female    Birth control/protection: Condom    Comment: declined condoms  Lifestyle  . Physical activity:    Days per week: Not on file    Minutes per session: Not on file  . Stress: Not on file  Relationships  . Social connections:    Talks on phone: Not on  file    Gets together: Not on file    Attends religious service: Not on file    Active member of club or organization: Not on file    Attends meetings of clubs or organizations: Not on file    Relationship status: Not on file  . Intimate partner violence:    Fear of current or ex partner: Not on file    Emotionally abused: Not on file    Physically abused: Not on file    Forced sexual activity: Not on file  Other Topics Concern  . Not on file  Social History Narrative   Lives in [place] with wife.     Review of Systems    General:  No chills, fever, night sweats or weight changes.  Cardiovascular:  +++ exertional chest pain, +++ dyspnea on exertion, no edema, orthopnea, palpitations, paroxysmal nocturnal dyspnea. Dermatological: No rash, lesions/masses Respiratory: No cough, +++ dyspnea Urologic: No hematuria, dysuria Abdominal:  +++ Increased abdominal bloating over the past week which improved following dialysis yesterday.  No nausea, vomiting, diarrhea, bright red blood per rectum, melena, or hematemesis Neurologic:  No visual changes, wkns, changes in mental status. All other systems reviewed and are otherwise negative except as noted above.  Physical Exam    Blood pressure 116/72, pulse 77, temperature 97.7 F (36.5 C), temperature source Oral, resp. rate 19, height 5\' 11"  (1.803 m), weight 63.5 kg, SpO2 100 %.  General: Pleasant, NAD Psych: Normal affect. Neuro: Alert and oriented X 3. Moves all extremities spontaneously. HEENT: Normal  Neck: Supple without bruits or JVD. Lungs:  Resp regular and unlabored, diminished breath sounds at bases, otherwise clear to auscultation. Heart: RRR no s3, s4, 2/6 systolic ejection murmur heard throughout, loudest at the upper sternal borders. Abdomen: Soft, non-tender, non-distended, BS + x 4.  Extremities: No clubbing, cyanosis.  Trace pretibial edema. DP/PT/Radials 2+ and equal bilaterally.  Labs    Troponin [facility]  Test) Recent Labs    04/30/2018 1455  TROPIPOC 0.86*   Lab Results  Component Value Date   WBC 6.1 05/10/2018   HGB 11.0 (L) 05/02/2018   HCT 35.1 (L) 05/07/2018   MCV 109.0 (H) 05/20/2018   PLT 78 (L) 05/14/2018  Recent Labs  Lab 05/22/2018 1436  NA 140  K 4.6  CL 102  CO2 25  BUN 24*  CREATININE 8.01*  CALCIUM 9.9  GLUCOSE 79   Lab Results  Component Value Date   CHOL 190 02/05/2017   HDL 48 02/05/2017   LDLCALC 117 (H) 02/05/2017   TRIG 125 02/05/2017     Radiology Studies    Dg Chest 2 View  Result Date: 05/25/2018 CLINICAL DATA:  Initial evaluation for acute shortness of breath since dialysis. EXAM: CHEST - 2 VIEW COMPARISON:  Prior radiograph from 10/20/2017. FINDINGS: Moderate cardiomegaly. Mediastinal silhouette within normal limits. Aortic atherosclerosis. Lungs mildly hypoinflated. Diffuse vascular congestion with asymmetric right greater than left lung opacities, which could reflect asymmetric edema or possibly infiltrates. Small right pleural effusion. No pneumothorax. No acute osseous abnormality. Multiple vascular stents overlie the chest and bilateral arms. IMPRESSION: 1. Cardiomegaly with perihilar vascular congestion and right greater than left pulmonary opacities. Asymmetric edema is favored, although infiltrates could be considered in the correct clinical setting. 2. Small right pleural effusion. 3. Aortic atherosclerosis. Electronically Signed   By: Rise Mu M.D.   On: 05/06/2018 15:58    ECG & Cardiac Imaging    Regular sinus rhythm, 80, right axis deviation, biatrial enlargement, left posterior fascicular block, LVH with repolarization abnormality/lateral T inversion.  Assessment & Plan    1.  Non-STEMI: Patient without prior cardiac history presents with exertional chest pain and dyspnea that began this morning.  He was having some abdominal bloating and mild chest discomfort earlier this week, which he associated with feeling volume  overloaded.  In the emergency department, his point-of-care troponin was normal however subsequent troponin returned at 0.86.  He is currently chest pain-free.  Agree with heparinization.  We will add low-dose aspirin, beta-blocker, and high potency statin therapy.  We have discussed options for management and he is currently scheduled for diagnostic catheterization on Monday.  The patient understands that risks include but are not limited to stroke (1 in 1000), death (1 in 1000), kidney failure [usually temporary] (1 in 500), bleeding (1 in 200), allergic reaction [possibly serious] (1 in 200), and agrees to proceed.    2.  End-stage renal disease: Creatinine 8.01 on admission.  Nephrology to follow.  He is due for dialysis tomorrow.  3.  Systolic murmur/aortic stenosis: Follow-up echocardiogram.  4.  Essential hypertension: Stable.  5.  HIV: Home meds per medicine.  6.  Hyperlipidemia: LDL 117 in May 2018.  Adding statin in the setting of presumed acute coronary syndrome.  Follow-up lipids and LFTs.  7.  Tob. Abuse:  Cessation advised.  Signed, Nicolasa Ducking, NP 05/04/2018, 9:09 PM  For questions or updates, please contact   Please consult [email or web address] for contact info under Cardiology/STEMI.  I have seen and examined the patient along with Ward Givens, NP.  I have reviewed the chart, notes and new data.  I agree with NP's note.  Key new complaints: Symptoms are highly consistent with unstable angina (crescendo and recent onset exertional angina Key examination changes: Cachectic, scars of previous and active AV graft in the right and left arms, respectively (excellent thrill and bruit on the left).  No physical findings of hypervolemia. Key new findings / data: Mildly elevated cardiac troponin consistent with high risk for acute MI  PLAN: I think he will require definitive cardiac catheterization.  Keep on intravenous heparin.  Check an echocardiogram.  Reevaluate lipids.  Continue  aspirin.  Thurmon Fair, MD,  [facility] [phone] 05/22/2018, 1:42 PM

## 2018-05-21 NOTE — H&P (Addendum)
[facility] Admission History and Physical Service Pager: [phone]  Patient name: [name] Medical record number: [id number] Date of birth: Mar 02, 1966 Age: 52 y.o. Gender: male  Primary Care Provider: Patient, No Pcp Per Consultants: cardiology Code Status: full code  Chief Complaint: SOB and chest pain  Assessment and Plan: ITHAN TOUHEY is a 52 y.o. male presenting with chest pain and feeling unwell. PMH is significant for HIV, ESRD, HSV, anemia, peripheral neuropathy, anemia, GI bleed, PUD, AS  NSTEMI- Patient has had a week of chest pain and shortness of breath associated with exertion and relieved with rest. Cardiology was consulted in the ED. Vital signs stable. Electrolytes within normal limits. EKG showed some prolonged QT and ST segment depression with t wave inversion that is unchanged from previous. Chest xray showed cardiomegaly with perihilar vascular congestion and right greater than left pulmonary opacities. Asymmetric edema is favored, although infiltrates could be considered in the correct clinical setting, small right pleural effusion, aortic atherosclerosis. Patient is afebrile with no leukocytosis or oxygen requirement, less likely additional pneumonia. Most likely some component of resultant heart failure given cardiomegaly and vascular congestion visualized on CXR and noted improvement in symptoms s/p HD, likely contributing to CP/SOB.  Most recent ECHO 12/2016 with LVEF 55-60% with worsened aortic stenosis from previous. Repeat ECHO is ordered. Troponin elevated to 0.86, Hgb 11.0, WBC 6.1, platelets 78.  Patient denies known history of CAD.  His risk factors are male, smoker, age. Patient does have history of GERD which could be contributing to chest pain on Protonix at home.   -admit to step down, Dr. Mingo Amber attending -telemetry  -f/u ECHO -heparin drip - nitroglycerin drip -s/p ASA 325 in ED -trend troponins -f/u  BNP -f/u magnesium -f.u TSH -f/u lipid panel -f/u Hgb A1c -f/u cardiology  -continue protonix once taking PO -vitals signs routine -NPO  ESRD on HD Tue, Thurs, Saturday. Creatinine 8.01 (appears to be around baseline), GFR 8 on admission. Patient does not produce urine. Patient states that he has been having improvement of his symptoms post dialysis when returned to his dry weight. He has not missed a session. His BMP has no electrolyte abnormalities. -consult nephrology for dialysis Saturday  HIV well controlled on current medication regimen. Follows with [facility], Dr. Megan Salon every 6 months. His last appointment showed CD4 count 780, and viral quant undetectable in Dec 2018. Reports missing 2-3 days of tivicay. No missed doses of viread. Missed one dose of norvir, prezista. Doesn't know when diagnosed. WBC 6.1 on admission, Hgb 11.0, afebrile. -continue home meds -CBC am  HSV- well controlled on home meds. Patient takes acyclovir as needed. Is not currently having an outbreak -order home med as needed  PUD- patient has history of GI bleed, most recent in 2018, received 8u pRBC at that time. He is not currently having melena or bleeding per rectum. His Hgb is better than baseline (~10) at 11.0 on admission. Home med is protonix daily. -continue home medication once taking PO  Anemia - patient Hgb on admission is 11.0, baseline ~10. MCV 109. Likely related to chronic kidney disease. No active bleeding. -CBC am  Peripheral neuropathy- patient states that his feet have claudication chronically. He is not experiencing any changes in this symptom  Pterygium- chronic, bilaterally. Patient has seen ophthalmology and was advised that he would need surgery to correct. He does occasionally have blurred vision in his left eye.  -order lubricating eye drops.   FEN/GI: NPO until  evaluated by cardio Prophylaxis: heparin drip  Disposition: admit to step down, attending Dr. Mingo Amber  History of  Present Illness:  [name] is a 52 y.o. male presenting with increased shortness of breath and chest pain for about a week. Associated with productive cough and rhinorrhea with white discharge and occasional yellow sputum. SOB is associated with exertion and relieved with rest. Chest pain is positional worsened with laying flat or sitting forward and improved with laying at incline and is described as a non-radiating sharp sternal chest pain on his right side of chest. He states that his symptoms were improved after scheduled dialysis and return to dry weight but then would return later that day. His SOB has been preventing him from walking further than a few steps without resting. On exertion, he feels a chest tightness "uncomfortable" rated 4/10.  Patient has been restricting his fluid intake because he believed his symptoms to be coming from fluid overload and now he feels very dehydrated.  He has been having pain in his feet when walking which is a chronic problem from PVD and unchanged since onset of new symptoms. Positive for tiredness, chest pain, SOB, occasional transient blurred vision, anorexia x1 week. He denies N/V, lightheadedness, fever, syncope, diaphoresis, diarrhea, constipation. Last BM was today and normal, pt does not make urine. Took zantac last night thinking pain was due to gas but it did not improve his symptoms and wife notes his current symptoms are not consistent with typical gas pain. Took some ibuprofen night before last due to chest pain.   Review Of Systems: Per HPI with the following additions:  Review of Systems  Constitutional: Positive for chills and malaise/fatigue. Negative for diaphoresis and fever.  Eyes: Positive for blurred vision and redness.  Respiratory: Positive for cough, sputum production and shortness of breath.   Cardiovascular: Positive for chest pain, orthopnea and claudication. Negative for leg swelling.  Gastrointestinal: Negative for  abdominal pain, blood in stool, constipation, diarrhea, melena, nausea and vomiting.  Genitourinary: Positive for urgency. Dysuria: does not produce urine.  Skin: Negative for rash.  Neurological: Positive for dizziness and weakness. Negative for loss of consciousness.  Psychiatric/Behavioral: Negative for substance abuse.    Patient Active Problem List   Diagnosis Date Noted  . Itching 10/28/2017  . Herpes zoster 10/08/2017  . HIV (human immunodeficiency virus infection) ([place])   . Nonrheumatic aortic valve stenosis   . PUD (peptic ulcer disease)   . Anemia 01/01/2017  . Toe ulcer ([place])   . GI bleed 12/07/2016  . Gastroesophageal reflux disease 09/13/2016  . Annual physical exam 09/13/2016  . Normocytic anemia 10/14/2015  . Thrombocytopenia ([place]) 10/14/2015  . Gall stone   . 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 End stage renal disease on dialysis ([place]) 01/02/2012  . PERIPHERAL NEUROPATHY 04/10/2009  . BACTEREMIA, MYCOBACTERIUM AVIUM COMPLEX 10/19/2006  . HIV disease ([place]) 10/19/2006  . HERPES, GENITAL NEC 10/19/2006  . WARTS, OTHER SPECIFIED VIRAL 10/19/2006  . SYPHILIS NOS 10/19/2006  . CIGARETTE SMOKER 10/19/2006  . Depression 10/19/2006    Past Medical History: Past Medical History:  Diagnosis Date  . Anal condyloma   . Anemia   . Diarrhea    occurs because of dialysis  . DJD (degenerative joint disease)    right knee.  All joints  . ESRD (end stage renal disease) on dialysis ([place]) 01/02/2012   Gets diaysis at [facility] on [address], TTS schedule.  Started dialysis in 2000.     Marland Kitchen  Family history of disseminated HSV infection   . Genital herpes   . Hemodialysis patient [facility])    Tuesday, Thursday, Saturday  . History of blood transfusion   . HIV infection ([place])   . Hypertension   . Pneumonia 1/20107   hx of  . Shortness of breath    "can happen at anytime" (03/04/2013).  03/14/16- "when I have too much fluid"  . Tertiary syphilis   . Thrombosis  of dialysis vascular access ([place]) 01/02/2012   RUA AV fistula clotted on 02/27/13, declotted by IR. Reclotted on 03/02/13 and IR placed L IJ tunneled HD catheter (a right external jugular tunneled HD cath was attempted first but did not function due to kinking across the clavicle). Pt was seen by [facility] on same admission 6/614 and said RUA AVF no longer usable, they ordered vein mapping and will see him as outpatient to set up next permanent access.       Past Surgical History: Past Surgical History:  Procedure Laterality Date  . A/V SHUNTOGRAM Left 04/15/2017   Procedure: A/V Shuntogram;  Surgeon: Waynetta Sandy, MD;  Location: [place] [facility];  Service: Cardiovascular;  Laterality: Left;  . APPENDECTOMY    . AV FISTULA PLACEMENT Right 06/16/11  . AV FISTULA PLACEMENT Left 03/17/2016   Procedure: INSERTION OF ARTERIOVENOUS (AV) GORE-TEX GRAFT ARM USING GORETEX 4-7MM X 45CM STRETCH GRAFT;  Surgeon: Elam Dutch, MD;  Location: [place];  Service: Vascular;  Laterality: Left;  . AV FISTULA PLACEMENT, BRACHIOCEPHALIC Right 85/02/77  . ESOPHAGOGASTRODUODENOSCOPY N/A 12/09/2016   Procedure: ESOPHAGOGASTRODUODENOSCOPY (EGD);  Surgeon: Otis Brace, MD;  Location: [place];  Service: Gastroenterology;  Laterality: N/A;  . ESOPHAGOGASTRODUODENOSCOPY N/A 01/05/2017   Procedure: ESOPHAGOGASTRODUODENOSCOPY (EGD);  Surgeon: Clarene Essex, MD;  Location: [facility];  Service: Endoscopy;  Laterality: N/A;  . ESOPHAGOGASTRODUODENOSCOPY (EGD) WITH PROPOFOL N/A 01/06/2017   Procedure: ESOPHAGOGASTRODUODENOSCOPY (EGD) WITH PROPOFOL;  Surgeon: Otis Brace, MD;  Location: [place];  Service: Gastroenterology;  Laterality: N/A;  . INCISION AND DRAINAGE ABSCESS     abdominal  . PERIPHERAL VASCULAR CATHETERIZATION N/A 03/07/2016   Procedure: Venogram;  Surgeon: Elam Dutch, MD;  Location: [place] [facility];  Service: Cardiovascular;  Laterality: N/A;  . PERIPHERAL VASCULAR INTERVENTION Left  04/15/2017   Procedure: Peripheral Vascular Intervention;  Surgeon: Waynetta Sandy, MD;  Location: [place] [facility];  Service: Cardiovascular;  Laterality: Left;  . REVISON OF ARTERIOVENOUS FISTULA Right 04/20/2013   Procedure: INSERTION OF ARTERIOVENOUS GORTEX GRAFT;  Surgeon: Elam Dutch, MD;  Location: [place];  Service: Vascular;  Laterality: Right;  Ultrasound Guided  . TONSILLECTOMY      Social History: Social History   Tobacco Use  . Smoking status: Heavy Tobacco Smoker    Packs/day: 0.50    Years: 27.00    Pack years: 13.50    Types: Cigarettes  . Smokeless tobacco: Never Used  Substance Use Topics  . Alcohol use: No    Alcohol/week: 0.0 standard drinks    Comment: 03/04/2013 "stopped drinking ~ 12 yr ago; never had problem w/it".    . Drug use: No   Additional social history: 1/4 pack cigarettes per day. No alcohol or drug use Please also refer to relevant sections of EMR.  Family History: Family History  Problem Relation Age of Onset  . Diabetes Mother   . Arthritis Father        Gout- Foot   Allergies and Medications: Allergies  Allergen Reactions  .  Iodinated Diagnostic Agents Hives, Itching and Rash    "skin rash - severe" per outside records  . Penicillins Hives and Other (See Comments)    Rx required Hospitalization Has patient had a PCN reaction causing immediate rash, facial/tongue/throat swelling, SOB or lightheadedness with hypotension: Yes Has patient had a PCN reaction causing severe rash involving mucus membranes or skin necrosis: No Has patient had a PCN reaction that required hospitalization Yes Has patient had a PCN reaction occurring within the last 10 years: No   . Shellfish-Derived Products Nausea Only    Irritates the stomach  . Acetaminophen-Codeine Nausea And Vomiting  . Tape Rash and Other (See Comments)    Use paper tape only (NO CLEAR PLASTIC TAPE!!)   No current facility-administered medications on file prior to  encounter.    Current Outpatient Medications on File Prior to Encounter  Medication Sig Dispense Refill  . albuterol (PROVENTIL HFA;VENTOLIN HFA) 108 (90 Base) MCG/ACT inhaler Inhale 2 puffs into the lungs every 6 (six) hours as needed for wheezing or shortness of breath.    . calcium acetate (PHOSLO) 667 MG capsule Take 2,668 mg by mouth See admin instructions. Take 2,668 mg by mouth three times a day with meals and 2,668 mg with each snack    . darunavir (PREZISTA) 800 MG tablet Take 1 tablet (800 mg total) by mouth at bedtime. 90 tablet 2  . ibuprofen (ADVIL,MOTRIN) 200 MG tablet Take 600 mg by mouth every 6 (six) hours as needed (for pain or headaches).     . Multiple Vitamins-Minerals (MULTIVITAMIN PO) Take 1 tablet by mouth daily.    . ritonavir (NORVIR) 100 MG TABS tablet TAKE 1 TABLET BY MOUTH AT BEDTIME. TAKE WITH PREZISTA TABLET 90 tablet 2  . tenofovir (VIREAD) 300 MG tablet TAKE 1 TABLET BY MOUTH ONCE A WEEK ON SUNDAYS AT BEDTIME 12 tablet 2  . TIVICAY 50 MG tablet TAKE 1 TABLET(50 MG) BY MOUTH AT BEDTIME 90 tablet 2  . acyclovir (ZOVIRAX) 400 MG tablet Take 400 mg tablet in evening after each HD session for the next week. (Patient not taking: Reported on 05/09/2018) 3 tablet 0  . darunavir (PREZISTA) 800 MG tablet TAKE 1 TABLET BY MOUTH AT BEDTIME WITH NORVIR (Patient not taking: Reported on 05/18/2018) 90 tablet 1  . docusate sodium (COLACE) 100 MG capsule Take 1 capsule (100 mg total) by mouth daily. (Patient not taking: Reported on 05/15/2018) 10 capsule 0  . dolutegravir (TIVICAY) 50 MG tablet Take 1 tablet (50 mg total) by mouth at bedtime. (Patient not taking: Reported on 05/17/2018) 90 tablet 1  . hydrOXYzine (ATARAX/VISTARIL) 25 MG tablet Take 1 tablet (25 mg total) by mouth every 6 (six) hours as needed for itching. (Patient not taking: Reported on 05/14/2018) 60 tablet 0  . pantoprazole (PROTONIX) 40 MG tablet TAKE 1 TABLET BY MOUTH TWICE DAILY (Patient not taking: Reported on  05/14/2018) 180 tablet 0    Objective: BP 117/78   Pulse 79   Temp 98.6 F (37 C) (Oral)   Resp (!) 24   Ht 6' (1.829 m)   Wt 66.9 kg   SpO2 100%   BMI 20.00 kg/m  Exam: General: resting comfortably in bed, looked tired Eyes: pterygium bilaterally ENTM:  Neck: large protruding vein on right- non pulsatile, left submandibular lymphadenitis  Cardiovascular: Regular rate, regular rhythm, 3/6 holosystolic murmur Respiratory: CTAB, chest non-tender to palpation Gastrointestinal: abdomen non-distended, no masses, normal bowel sounds, large healed surgical scar from xyphoid process  to pelvis. Painful to palpation in epigastric/central abdomen without guarding. MSK: frail extremities with dialysis shunt on left arm Derm: patient had hard .5cm raised nodules under skin on left forearm Neuro: alert and oriented, answered questions appropriately Psych: pleasant and joking  Labs and Imaging: CBC BMET  Recent Labs  Lab 05/18/2018 1436  WBC 6.1  HGB 11.0*  HCT 35.1*  PLT 78*   Recent Labs  Lab 05/25/2018 1436  NA 140  K 4.6  CL 102  CO2 25  BUN 24*  CREATININE 8.01*  GLUCOSE 79  CALCIUM 9.9     Troponin x1 0.86  Dg Chest 2 View  Result Date: 05/12/2018 CLINICAL DATA:  Initial evaluation for acute shortness of breath since dialysis. EXAM: CHEST - 2 VIEW COMPARISON:  Prior radiograph from 10/20/2017. FINDINGS: Moderate cardiomegaly. Mediastinal silhouette within normal limits. Aortic atherosclerosis. Lungs mildly hypoinflated. Diffuse vascular congestion with asymmetric right greater than left lung opacities, which could reflect asymmetric edema or possibly infiltrates. Small right pleural effusion. No pneumothorax. No acute osseous abnormality. Multiple vascular stents overlie the chest and bilateral arms. IMPRESSION: 1. Cardiomegaly with perihilar vascular congestion and right greater than left pulmonary opacities. Asymmetric edema is favored, although infiltrates could be  considered in the correct clinical setting. 2. Small right pleural effusion. 3. Aortic atherosclerosis. Electronically Signed   By: Jeannine Boga M.D.   On: 05/26/2018 15:58   Doristine Mango, DO 05/22/2018, 4:52 PM PGY-1, [place] Intern pager: [phone], text pages welcome  FPTS Upper-Level Resident Addendum   I have independently interviewed and examined the patient. I have discussed the above with the original author and agree with their documentation. My edits for correction/addition/clarification are in green. Please see also any attending notes.    Rory Percy, DO PGY-2, [place] [facility] 05/27/2018 9:58 PM  FPTS Service pager: [phone] (text pages welcome through [facility])

## 2018-05-21 NOTE — Progress Notes (Signed)
1830 Pt arrived via stretcher. A&Ox4, denies CP, c/o wanting to eat. RN paged attending MD, per MD, keep NPO until seen by cards. Updated with POC, pt assessed, see flowsheet, [facility]. Pt oriented to room, no complaints, WCTM.

## 2018-05-21 NOTE — Progress Notes (Signed)
ANTICOAGULATION CONSULT NOTE - Initial Consult  Pharmacy Consult for heparin Indication: chest pain/ACS  Allergies  Allergen Reactions  . Penicillins Hives and Other (See Comments)    Rx required Hospitalization Has patient had a PCN reaction causing immediate rash, facial/tongue/throat swelling, SOB or lightheadedness with hypotension: Yes Has patient had a PCN reaction causing severe rash involving mucus membranes or skin necrosis: No Has patient had a PCN reaction that required hospitalization Yes Has patient had a PCN reaction occurring within the last 10 years: No   . Contrast Media [Iodinated Diagnostic Agents] Hives and Itching  . Acetaminophen-Codeine Nausea And Vomiting  . Oxycodone-Acetaminophen Other (See Comments)    Reaction unknown  . Tape Rash and Other (See Comments)    Use paper tape only (NO CLEAR PLASTIC TAPE!!)    Patient Measurements: Height: 6' (182.9 cm) Weight: 147 lb 7.8 oz (66.9 kg) IBW/kg (Calculated) : 77.6 Heparin Dosing Weight: 66.7 Kg  Vital Signs: Temp: 98.6 F (37 C) (08/23 1412) Temp Source: Oral (08/23 1412) BP: 117/78 (08/23 1600) Pulse Rate: 79 (08/23 1600)  Labs: Recent Labs    05/02/2018 1436  HGB 11.0*  HCT 35.1*  PLT 78*  CREATININE 8.01*    Estimated Creatinine Clearance: 10.4 mL/min (A) (by C-G formula based on SCr of 8.01 mg/dL (H)).   Medical History: Past Medical History:  Diagnosis Date  . Anal condyloma   . Anemia   . Diarrhea    occurs because of dialysis  . DJD (degenerative joint disease)    right knee.  All joints  . ESRD (end stage renal disease) on dialysis ([facility]) 01/02/2012   [facility] [address], TTS schedule.  Started dialysis in 2000.     . Family history of disseminated HSV infection   . Genital herpes   . Hemodialysis patient [facility])    Tuesday, Thursday, Saturday  . History of blood transfusion   . HIV infection ([facility])   . Hypertension   . Pneumonia 1/20107   hx of  .  Shortness of breath    "can happen at anytime" (03/04/2013).  03/14/16- "when I have too much fluid"  . Tertiary syphilis   . 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 Reclotted on 03/02/13 and IR placed L IJ tunneled HD catheter (a right external jugular tunneled HD cath was attempted first but did not function due to kinking across the clavicle). Pt was seen by [facility] on same admission 6/614 and said RUA AVF no longer usable, they ordered vein mapping and will see him as outpatient to set up next permanent access.      Assessment: 52yoM with PMH ESRD and HIV presenting with shortness of breath. No anticoagulation reported PTA. Patient noted to have Hgb 11.0 and PLT 78. The provider would like to avoid heparin bolus and start infusion at this time.   Goal of Therapy:  Heparin level 0.3-0.7 units/ml Monitor platelets by anticoagulation protocol: Yes   Plan:  Start heparin infusion at  800 units/hr Check anti-Xa level in 6 hours and daily while on heparin Continue to monitor H&H and platelets  [name], PharmD Clinical Pharmacist 04/29/2018 4:06 PM Please check AMION for all [facility] numbers

## 2018-05-21 NOTE — ED Provider Notes (Signed)
[place] EMERGENCY DEPARTMENT Provider Note   CSN: [id number] Arrival date & time: 05/05/2018  1403     History   Chief Complaint Chief Complaint  Patient presents with  . Shortness of Breath    HPI [name] is a 52 y.o. male.  HPI   42yM with exertional dyspnea and CP. Onset 2-3 days ago. Feels ok at rest. Feels SOB with mild exertion and also occasional sharp substernal CP with it. Non-productive cough. No fever or chills. ESRD and reports compliance with HD. T,H,S and had full session yesterday. No new swelling. Denies hx of DVT/PE. Denies known cardiac hx.   Past Medical History:  Diagnosis Date  . Anal condyloma   . Anemia   . Diarrhea    occurs because of dialysis  . DJD (degenerative joint disease)    right knee.  All joints  . ESRD (end stage renal disease) on dialysis ([place]) 01/02/2012   Gets diaysis at [facility] on [address], TTS schedule.  Started dialysis in 2000.     . Family history of disseminated HSV infection   . Genital herpes   . Hemodialysis patient [facility])    Tuesday, Thursday, Saturday  . History of blood transfusion   . HIV infection ([place])   . Hypertension   . Pneumonia 1/20107   hx of  . Shortness of breath    "can happen at anytime" (03/04/2013).  03/14/16- "when I have too much fluid"  . Tertiary syphilis   . Thrombosis of dialysis vascular access ([place]) 01/02/2012   RUA AV fistula clotted on 02/27/13, declotted by IR. Reclotted on 03/02/13 and IR placed L IJ tunneled HD catheter (a right external jugular tunneled HD cath was attempted first but did not function due to kinking across the clavicle). Pt was seen by VVS on same admission 6/614 and said RUA AVF no longer usable, they ordered vein mapping and will see him as outpatient to set up next permanent access.       Patient Active Problem List   Diagnosis Date Noted  . Itching 10/28/2017  . Herpes zoster 10/08/2017  . HIV (human immunodeficiency virus infection)  ([place])   . Nonrheumatic aortic valve stenosis   . PUD (peptic ulcer disease)   . Anemia 01/01/2017  . Toe ulcer ([place])   . GI bleed 12/07/2016  . Gastroesophageal reflux disease 09/13/2016  . Annual physical exam 09/13/2016  . Normocytic anemia 10/14/2015  . Thrombocytopenia ([place]) 10/14/2015  . Gall stone   . 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 End stage renal disease on dialysis ([place]) 01/02/2012  . PERIPHERAL NEUROPATHY 04/10/2009  . BACTEREMIA, MYCOBACTERIUM AVIUM COMPLEX 10/19/2006  . HIV disease ([place]) 10/19/2006  . HERPES, GENITAL NEC 10/19/2006  . WARTS, OTHER SPECIFIED VIRAL 10/19/2006  . SYPHILIS NOS 10/19/2006  . CIGARETTE SMOKER 10/19/2006  . Depression 10/19/2006    Past Surgical History:  Procedure Laterality Date  . A/V SHUNTOGRAM Left 04/15/2017   Procedure: A/V Shuntogram;  Surgeon: Waynetta Sandy, MD;  Location: [place] CV LAB;  Service: Cardiovascular;  Laterality: Left;  . APPENDECTOMY    . AV FISTULA PLACEMENT Right 06/16/11  . AV FISTULA PLACEMENT Left 03/17/2016   Procedure: INSERTION OF ARTERIOVENOUS (AV) GORE-TEX GRAFT ARM USING GORETEX 4-7MM X 45CM STRETCH GRAFT;  Surgeon: Elam Dutch, MD;  Location: [place];  Service: Vascular;  Laterality: Left;  . AV FISTULA PLACEMENT, BRACHIOCEPHALIC Right 23/53/61  . ESOPHAGOGASTRODUODENOSCOPY N/A 12/09/2016  Procedure: ESOPHAGOGASTRODUODENOSCOPY (EGD);  Surgeon: Otis Brace, MD;  Location: [place];  Service: Gastroenterology;  Laterality: N/A;  . ESOPHAGOGASTRODUODENOSCOPY N/A 01/05/2017   Procedure: ESOPHAGOGASTRODUODENOSCOPY (EGD);  Surgeon: Clarene Essex, MD;  Location: [facility] ENDOSCOPY;  Service: Endoscopy;  Laterality: N/A;  . ESOPHAGOGASTRODUODENOSCOPY (EGD) WITH PROPOFOL N/A 01/06/2017   Procedure: ESOPHAGOGASTRODUODENOSCOPY (EGD) WITH PROPOFOL;  Surgeon: Otis Brace, MD;  Location: [place];  Service: Gastroenterology;  Laterality: N/A;  . INCISION AND DRAINAGE ABSCESS      abdominal  . PERIPHERAL VASCULAR CATHETERIZATION N/A 03/07/2016   Procedure: Venogram;  Surgeon: Elam Dutch, MD;  Location: [place] CV LAB;  Service: Cardiovascular;  Laterality: N/A;  . PERIPHERAL VASCULAR INTERVENTION Left 04/15/2017   Procedure: Peripheral Vascular Intervention;  Surgeon: Waynetta Sandy, MD;  Location: [place] CV LAB;  Service: Cardiovascular;  Laterality: Left;  . REVISON OF ARTERIOVENOUS FISTULA Right 04/20/2013   Procedure: INSERTION OF ARTERIOVENOUS GORTEX GRAFT;  Surgeon: Elam Dutch, MD;  Location: [place];  Service: Vascular;  Laterality: Right;  Ultrasound Guided  . TONSILLECTOMY          Home Medications    Prior to Admission medications   Medication Sig Start Date End Date Taking? Authorizing Provider  acyclovir (ZOVIRAX) 400 MG tablet Take 400 mg tablet in evening after each HD session for the next week. Patient not taking: Reported on 10/28/2017 10/10/17   Nicolette Bang, DO  albuterol (PROVENTIL HFA;VENTOLIN HFA) 108 [phone] Base) MCG/ACT inhaler Inhale 2 puffs into the lungs every 6 (six) hours as needed for wheezing or shortness of breath.    [provider]  calcium acetate (PHOSLO) 667 MG capsule Take 1,334-2,668 mg by mouth See admin instructions. Take 1334 mg by mouth three times a day with meals and 2,668 mg (also) with each snack    [provider]  [facility] ALLERGY 25 MG tablet TAKE 2 TABLETS BY MOUTH DAY OF PROCEDURE 03/18/18   [provider]  darunavir (PREZISTA) 800 MG tablet TAKE 1 TABLET BY MOUTH AT BEDTIME WITH NORVIR 10/22/17   Michel Bickers, MD  darunavir (PREZISTA) 800 MG tablet Take 1 tablet (800 mg total) by mouth at bedtime. 02/08/18   Michel Bickers, MD  docusate sodium (COLACE) 100 MG capsule Take 1 capsule (100 mg total) by mouth daily. Patient not taking: Reported on 11/08/2017 12/10/16   Eloise Levels, MD  dolutegravir (TIVICAY) 50 MG tablet Take 1 tablet (50 mg total) by mouth  at bedtime. 10/22/17   Michel Bickers, MD  gabapentin (NEURONTIN) 100 MG capsule TK 2 CS PO HS PRF LEFT HAND PAIN 01/26/18   [provider]  hydrOXYzine (ATARAX/VISTARIL) 25 MG tablet Take 1 tablet (25 mg total) by mouth every 6 (six) hours as needed for itching. 10/28/17   [place] Bing, DO  Multiple Vitamins-Minerals (MULTIVITAMIN PO) Take 1 tablet by mouth daily.    [provider]  pantoprazole (PROTONIX) 40 MG tablet TAKE 1 TABLET BY MOUTH TWICE DAILY 01/08/17   Mercy Riding, MD  predniSONE (DELTASONE) 5 MG tablet TAKE 8 TABS BY MOUTH AT 7 PM, 8 TABS AT 11 PM THEN 8 TABS AT 7 AM DAY OF PROCEDURE 03/18/18   [provider]  ritonavir (NORVIR) 100 MG TABS tablet TAKE 1 TABLET BY MOUTH AT BEDTIME. TAKE WITH PREZISTA TABLET 02/08/18   Michel Bickers, MD  tenofovir (VIREAD) 300 MG tablet TAKE 1 TABLET BY MOUTH ONCE A WEEK ON SUNDAYS AT BEDTIME 02/08/18   Megan Salon,  John, MD  TIVICAY 50 MG tablet TAKE 1 TABLET(50 MG) BY MOUTH AT BEDTIME 02/08/18   Michel Bickers, MD    Family History Family History  Problem Relation Age of Onset  . Diabetes Mother   . Arthritis Father        Gout- Foot    Social History Social History   Tobacco Use  . Smoking status: Heavy Tobacco Smoker    Packs/day: 0.50    Years: 27.00    Pack years: 13.50    Types: Cigarettes  . Smokeless tobacco: Never Used  Substance Use Topics  . Alcohol use: No    Alcohol/week: 0.0 standard drinks    Comment: 03/04/2013 "stopped drinking ~ 12 yr ago; never had problem w/it".    . Drug use: No     Allergies   Penicillins; Contrast media [iodinated diagnostic agents]; Acetaminophen-codeine; Oxycodone-acetaminophen; and Tape   Review of Systems Review of Systems  All systems reviewed and negative, other than as noted in HPI.  Physical Exam Updated Vital Signs BP 104/70   Pulse 81   Temp 98.6 F (37 C) (Oral)   Resp (!) 22   Ht 6' (1.829 m)   Wt 66.9 kg   SpO2 98%   BMI 20.00 kg/m    Physical Exam  Constitutional: He appears well-developed and well-nourished. No distress.  Bed.  Chronically ill-appearing, but not distressed.  HENT:  Head: Normocephalic and atraumatic.  Eyes: Conjunctivae are normal. Right eye exhibits no discharge. Left eye exhibits no discharge.  Neck: Neck supple.  Cardiovascular: Normal rate, regular rhythm and normal heart sounds. Exam reveals no gallop and no friction rub.  No murmur heard. Fistula right upper extremity with thrill.  Pulmonary/Chest: Effort normal and breath sounds normal. No respiratory distress.  Abdominal: Soft. He exhibits no distension. There is no tenderness.  Musculoskeletal: He exhibits no edema or tenderness.  Neurological: He is alert.  Skin: Skin is warm and dry.  Psychiatric: He has a normal mood and affect. His behavior is normal. Thought content normal.  Nursing note and vitals reviewed.    ED Treatments / Results  Labs (all labs ordered are listed, but only abnormal results are displayed) Labs Reviewed  BASIC METABOLIC PANEL - Abnormal; Notable for the following components:      Result Value   BUN 24 (*)    Creatinine, Ser 8.01 (*)    GFR calc non Af Amer 7 (*)    GFR calc Af Amer 8 (*)    All other components within normal limits  CBC - Abnormal; Notable for the following components:   RBC 3.22 (*)    Hemoglobin 11.0 (*)    HCT 35.1 (*)    MCV 109.0 (*)    MCH 34.2 (*)    RDW 17.9 (*)    Platelets 78 (*)    All other components within normal limits  I-STAT TROPONIN, ED - Abnormal; Notable for the following components:   Troponin i, poc 0.86 (*)    All other components within normal limits    EKG EKG Interpretation  Date/Time:  Friday May 21 2018 14:08:51 EDT Ventricular Rate:  80 PR Interval:  144 QRS Duration: 94 QT Interval:  416 QTC Calculation: 479 R Axis:   121 Text Interpretation:  Normal sinus rhythm Biatrial enlargement Left posterior fascicular block Non-specific ST-t  changes Prolonged QT Abnormal ECG Confirmed by Virgel Manifold [phone]) on 05/07/2018 3:02:36 PM   Radiology No results found.   Dg Chest  2 View  Result Date: 05/22/2018 CLINICAL DATA:  Initial evaluation for acute shortness of breath since dialysis. EXAM: CHEST - 2 VIEW COMPARISON:  Prior radiograph from 10/20/2017. FINDINGS: Moderate cardiomegaly. Mediastinal silhouette within normal limits. Aortic atherosclerosis. Lungs mildly hypoinflated. Diffuse vascular congestion with asymmetric right greater than left lung opacities, which could reflect asymmetric edema or possibly infiltrates. Small right pleural effusion. No pneumothorax. No acute osseous abnormality. Multiple vascular stents overlie the chest and bilateral arms. IMPRESSION: 1. Cardiomegaly with perihilar vascular congestion and right greater than left pulmonary opacities. Asymmetric edema is favored, although infiltrates could be considered in the correct clinical setting. 2. Small right pleural effusion. 3. Aortic atherosclerosis. Electronically Signed   By: Jeannine Boga M.D.   On: 05/17/2018 15:58    Procedures Procedures (including critical care time)  CRITICAL CARE Performed by: Virgel Manifold Total critical care time: 35 minutes Critical care time was exclusive of separately billable procedures and treating other patients. Critical care was necessary to treat or prevent imminent or life-threatening deterioration. Critical care was time spent personally by me on the following activities: development of treatment plan with patient and/or surrogate as well as nursing, discussions with consultants, evaluation of patient's response to treatment, examination of patient, obtaining history from patient or surrogate, ordering and performing treatments and interventions, ordering and review of laboratory studies, ordering and review of radiographic studies, pulse oximetry and re-evaluation of patient's condition.   Medications  Ordered in ED Medications  aspirin chewable tablet 324 mg (has no administration in time range)     Initial Impression / Assessment and Plan / ED Course  I have reviewed the triage vital signs and the nursing notes.  Pertinent labs & imaging results that were available during my care of the patient were reviewed by me and considered in my medical decision making (see chart for details).  I have reviewed the triage vital signs and the nursing notes. Prior records were reviewed for additional information.    Pertinent labs & imaging results that were available during my care of the patient were reviewed by me and considered in my medical decision making (see chart for details).   52yM with exertional dyspnea and CP. EKG abnormal but not overly changed looking back over several priors. Currently no symptoms at rest. ASA. Heparin infusion w/o bolus because of thrombocytopenia which is chronic. Discussed with cardiology who will see in consultation. PCP Dr. Bettina Gavia. Discussed with Family Medicine for admission .   Final Clinical Impressions(s) / ED Diagnoses   Final diagnoses:  NSTEMI (non-ST elevated myocardial infarction) [facility])    ED Discharge Orders    None       Virgel Manifold, MD 05/27/18 1351

## 2018-05-21 NOTE — ED Notes (Signed)
Patient transported to x-ray. ?

## 2018-05-21 NOTE — ED Triage Notes (Signed)
Pt presents with 2 day h/o shortness of breath.  Pt reports productive cough with white phlegm.  Pt reports HD on Tues, thurs and sat; received full treatment yesterday.  Pt reports mid-sternal chest pain that radiates to R side x 2 days.

## 2018-05-21 NOTE — Progress Notes (Signed)
Family Medicine Teaching Service Daily Progress Note Intern Pager: [phone]  Patient name: [name] Medical record number: [id number] Date of birth: 06/06/1966 Age: 52 y.o. Gender: male  Primary Care Provider: Patient, No Pcp Per Consultants: Cards Code Status: Full   Pt Overview and Major Events to Date:  8/23 admitted to FPTS  Assessment and Plan: [name] is a 52 y.o. male presenting with chest pain and feeling unwell. PMH is significant for HIV, ESRD, HSV, anemia, peripheral neuropathy, anemia, GI bleed, PUD, AS.  NSTEMI: Stable Cardiology started ASA 81mg  QD, Beta-blocker, high intensity stain.  Will cath Monday.  Trops 0.86>1.31>1.35.  BNP >4500. Mag 2.2. TSH 1.763. A1c 4.5. Overnight vitals stable, no chest pain while in bed. This AM no complaints. - cards consulted, appreciate recs - cath planned for 8/26 - f/u troponin today - cont hep drip - cont nitro drip - cont ASA 81mg  - cont lopressor 12.5mg  BID - cont Crestor 5mg  QD - cont cardiac monitoring and continuous pulse ox  Systolic murmur: Chronic History of aortic stenosis.  Echo pending. - f/u Echo results  - cards consulted, appreciate recs  ESRD on HD TTS schedule. - needs HD today - will consult Nephro this AM  HTN: Stable  No home meds.  BP this AM 115/84. - cont to monitor vitals  HLD: Chronic Lipid Panel pending. LFTs pending. - f/u lipid panel today - f/u LFTs today  - cont crestor 5mg  QD  HIV: Chronic, well-controlled Last CD4 count 780, viral load undetectable. - cont home meds  HSV: Chronic, stable On Acyclovir prn.  No current outbreak. - consider acyclovir prn if symptoms arise during hospitalization  PUD: Stable Hx GI in 2018.  On protonix 40mg  BID at home.  Hgb baseline ~10.  Hgb pending this AM. Continues to deny melena and BRBPR.  - f/u CBC this AM - cont home protonix - cont to monitor CBC  Anemia: Chronic, stable Likely related to CKD.  Baseline ~10.   Hgb this AM pending.  -f/u CBC this AM - cont to monitor CBC  Peripheral Neuropathy: Chronic Symptoms unchanged. Last Vitamin B12 12/2016, 332. - cont to monitor  Pterygium OU: Chronic No change in symptoms,  has seen ophtho in the past. - cont artificial tears prn  Tobacco Abuse Current 0.5 ppd smoker. - encourage tobacco cessation - consider nicotine patch prn   FEN/GI: Heart Healthy PPx: on heparin drip  Disposition: pending clinical improvement   Subjective:  Patient denies any chest pain while resting.  Notes that he did not rest well overnight and is very tired.  Objective: Temp:  [97.4 F (36.3 C)-98.6 F (37 C)] 97.4 F (36.3 C) (08/24 0500) Pulse Rate:  [70-83] 71 (08/24 0500) Resp:  [16-24] 16 (08/24 0500) BP: (102-144)/(63-89) 115/84 (08/24 0500) SpO2:  [95 %-100 %] 100 % (08/24 0500) Weight:  [63.5 kg-66.9 kg] 64.2 kg (08/24 0500)  Physical Exam: General: 52 y.o. male in NAD Cardio: RRR, systolic murmur Lungs: CTAB, no wheezing, no rhonchi, no crackles Abdomen: Soft, non-tender to palpation, positive bowel sounds Skin: warm and dry Extremities: No edema   Laboratory: Recent Labs  Lab 05/16/2018 1436  WBC 6.1  HGB 11.0*  HCT 35.1*  PLT 78*   Recent Labs  Lab 05/16/2018 1436  NA 140  K 4.6  CL 102  CO2 25  BUN 24*  CREATININE 8.01*  CALCIUM 9.9  GLUCOSE 79     Imaging/Diagnostic Tests: Dg Chest 2 View  Result Date: 04/29/2018 CLINICAL DATA:  Initial evaluation for acute shortness of breath since dialysis. EXAM: CHEST - 2 VIEW COMPARISON:  Prior radiograph from 10/20/2017. FINDINGS: Moderate cardiomegaly. Mediastinal silhouette within normal limits. Aortic atherosclerosis. Lungs mildly hypoinflated. Diffuse vascular congestion with asymmetric right greater than left lung opacities, which could reflect asymmetric edema or possibly infiltrates. Small right pleural effusion. No pneumothorax. No acute osseous abnormality. Multiple vascular  stents overlie the chest and bilateral arms. IMPRESSION: 1. Cardiomegaly with perihilar vascular congestion and right greater than left pulmonary opacities. Asymmetric edema is favored, although infiltrates could be considered in the correct clinical setting. 2. Small right pleural effusion. 3. Aortic atherosclerosis. Electronically Signed   By: Rise Mu M.D.   On: 05/27/2018 15:58    Saharsh Sterling, Solmon Ice, DO 05/22/2018, 7:27 AM PGY-1, [place] [facility] FPTS Intern pager: [phone], text pages welcome

## 2018-05-21 NOTE — ED Provider Notes (Signed)
Patient placed in Quick Look pathway, seen and evaluated   Chief Complaint: Cough, shortness of breath  HPI:   Patient presents today for 2 days of productive cough and shortness of breath. He has not missed any HD.  He has had mid sternal chest painthat radiates to right side x2 days.  Has not been taking his medication.  ROS: Chest pain, productive cough.   Physical Exam:   Gen: No distress  Neuro: Awake and Alert  Skin: Warm    Focused Exam: Lungs CTAB.    Initiation of care has begun. The patient has been counseled on the process, plan, and necessity for staying for the completion/evaluation, and the remainder of the medical screening examination    Norman ClayHammond, Ewa Hipp W, PA-C 05/13/2018 1421    Sabas SousBero, Michael M, MD 05/29/2018 [phone]

## 2018-05-22 ENCOUNTER — Other Ambulatory Visit (HOSPITAL_COMMUNITY): Payer: Medicare Other

## 2018-05-22 DIAGNOSIS — I214 Non-ST elevation (NSTEMI) myocardial infarction: Principal | ICD-10-CM

## 2018-05-22 DIAGNOSIS — N186 End stage renal disease: Secondary | ICD-10-CM

## 2018-05-22 DIAGNOSIS — Z992 Dependence on renal dialysis: Secondary | ICD-10-CM

## 2018-05-22 DIAGNOSIS — B20 Human immunodeficiency virus [HIV] disease: Secondary | ICD-10-CM

## 2018-05-22 LAB — CBC
HCT: 34 % — ABNORMAL LOW (ref 39.0–52.0)
Hemoglobin: 10.8 g/dL — ABNORMAL LOW (ref 13.0–17.0)
MCH: 34.2 pg — AB (ref 26.0–34.0)
MCHC: 31.8 g/dL (ref 30.0–36.0)
MCV: 107.6 fL — ABNORMAL HIGH (ref 78.0–100.0)
PLATELETS: 75 10*3/uL — AB (ref 150–400)
RBC: 3.16 MIL/uL — AB (ref 4.22–5.81)
RDW: 17.8 % — ABNORMAL HIGH (ref 11.5–15.5)
WBC: 6.3 10*3/uL (ref 4.0–10.5)

## 2018-05-22 LAB — COMPREHENSIVE METABOLIC PANEL
ALBUMIN: 3.1 g/dL — AB (ref 3.5–5.0)
ALT: 9 U/L (ref 0–44)
AST: 14 U/L — ABNORMAL LOW (ref 15–41)
Alkaline Phosphatase: 201 U/L — ABNORMAL HIGH (ref 38–126)
Anion gap: 13 (ref 5–15)
BUN: 31 mg/dL — ABNORMAL HIGH (ref 6–20)
CO2: 24 mmol/L (ref 22–32)
CREATININE: 9 mg/dL — AB (ref 0.61–1.24)
Calcium: 9.6 mg/dL (ref 8.9–10.3)
Chloride: 102 mmol/L (ref 98–111)
GFR calc non Af Amer: 6 mL/min — ABNORMAL LOW (ref >60)
GFR, EST AFRICAN AMERICAN: 7 mL/min — AB (ref >60)
GLUCOSE: 82 mg/dL (ref 70–99)
Potassium: 5 mmol/L (ref 3.5–5.1)
SODIUM: 139 mmol/L (ref 135–145)
Total Bilirubin: 2.6 mg/dL — ABNORMAL HIGH (ref 0.3–1.2)
Total Protein: 7.7 g/dL (ref 6.5–8.1)

## 2018-05-22 LAB — HEPARIN LEVEL (UNFRACTIONATED)
HEPARIN UNFRACTIONATED: 0.17 [IU]/mL — AB (ref 0.30–0.70)
HEPARIN UNFRACTIONATED: 0.37 [IU]/mL (ref 0.30–0.70)

## 2018-05-22 LAB — LIPID PANEL
CHOL/HDL RATIO: 3 ratio
Cholesterol: 145 mg/dL (ref 0–200)
HDL: 48 mg/dL (ref >40)
LDL CALC: 87 mg/dL (ref 0–99)
Triglycerides: 51 mg/dL (ref <150)
VLDL: 10 mg/dL (ref 0–40)

## 2018-05-22 LAB — TROPONIN I
TROPONIN I: 1.07 ng/mL — AB (ref <0.03)
Troponin I: 1.35 ng/mL (ref <0.03)
Troponin I: 1.43 ng/mL (ref <0.03)

## 2018-05-22 MED ORDER — HEPARIN SODIUM (PORCINE) 1000 UNIT/ML DIALYSIS: 1000.0000 [IU] | INTRAMUSCULAR | Status: DC | PRN

## 2018-05-22 MED ORDER — SODIUM CHLORIDE 0.9 % IV SOLN: 100.0000 mL | INTRAVENOUS | Status: DC | PRN

## 2018-05-22 MED ORDER — DIPHENHYDRAMINE HCL 25 MG PO CAPS
25.0000 mg | ORAL_CAPSULE | Freq: Once | ORAL | Status: AC
  Administered 2018-05-22: 25 mg via ORAL
  Filled 2018-05-22: qty 1

## 2018-05-22 MED ORDER — LIDOCAINE-PRILOCAINE 2.5-2.5 % EX CREA: 1.0000 "application " | TOPICAL_CREAM | CUTANEOUS | Status: DC | PRN

## 2018-05-22 MED ORDER — HYPROMELLOSE (GONIOSCOPIC) 2.5 % OP SOLN: 1.0000 [drp] | Freq: Four times a day (QID) | OPHTHALMIC | Status: DC | PRN

## 2018-05-22 MED ORDER — PENTAFLUOROPROP-TETRAFLUOROETH EX AERO: 1.0000 "application " | INHALATION_SPRAY | CUTANEOUS | Status: DC | PRN

## 2018-05-22 MED ORDER — PANTOPRAZOLE SODIUM 40 MG PO TBEC
40.0000 mg | DELAYED_RELEASE_TABLET | Freq: Two times a day (BID) | ORAL | Status: DC
  Administered 2018-05-23 – 2018-06-06 (×30): 40 mg via ORAL
  Filled 2018-05-22 (×30): qty 1

## 2018-05-22 MED ORDER — LIDOCAINE HCL (PF) 1 % IJ SOLN: 5.0000 mL | INTRAMUSCULAR | Status: DC | PRN

## 2018-05-22 MED ORDER — POLYVINYL ALCOHOL 1.4 % OP SOLN
1.0000 [drp] | Freq: Four times a day (QID) | OPHTHALMIC | Status: DC | PRN
  Filled 2018-05-22: qty 15

## 2018-05-22 MED ORDER — CHLORHEXIDINE GLUCONATE CLOTH 2 % EX PADS: 6.0000 | MEDICATED_PAD | Freq: Every day | CUTANEOUS | Status: DC

## 2018-05-22 NOTE — Progress Notes (Addendum)
ANTICOAGULATION CONSULT NOTE  Pharmacy Consult for heparin Indication: chest pain/ACS  Allergies  Allergen Reactions  . Iodinated Diagnostic Agents Hives, Itching and Rash    "skin rash - severe" per outside records  . Penicillins Hives and Other (See Comments)    Rx required Hospitalization Has patient had a PCN reaction causing immediate rash, facial/tongue/throat swelling, SOB or lightheadedness with hypotension: Yes Has patient had a PCN reaction causing severe rash involving mucus membranes or skin necrosis: No Has patient had a PCN reaction that required hospitalization Yes Has patient had a PCN reaction occurring within the last 10 years: No   . Shellfish-Derived Products Nausea Only    Irritates the stomach  . Acetaminophen-Codeine Nausea And Vomiting  . Tape Rash and Other (See Comments)    Use paper tape only (NO CLEAR PLASTIC TAPE!!)    Patient Measurements: Height: 5\' 11"  (180.3 cm) Weight: 141 lb 9.6 oz (64.2 kg) IBW/kg (Calculated) : 75.3 Heparin Dosing Weight: 66.7 Kg  Vital Signs: Temp: 97.6 F (36.4 C) (08/24 1123) Temp Source: Oral (08/24 1123) BP: 106/62 (08/24 1123) Pulse Rate: 71 (08/24 1123)  Labs: Recent Labs    05/06/2018 1436 05/11/2018 1907 05/22/18 0043 05/22/18 0642 05/22/18 1104  HGB 11.0*  --   --  10.8*  --   HCT 35.1*  --   --  34.0*  --   PLT 78*  --   --  75*  --   HEPARINUNFRC  --   --  <0.10*  --  0.17*  CREATININE 8.01*  --   --  9.00*  --   TROPONINI  --  1.31* 1.35* 1.43*  --     Estimated Creatinine Clearance: 8.9 mL/min (A) (by C-G formula based on SCr of 9 mg/dL (H)).   Medical History: Past Medical History:  Diagnosis Date  . Anal condyloma   . Anemia   . Aortic stenosis    a. 12/2016 Echo: EF 55-60%, no rwma, possibly bicuspid AoV, mod thickened, mod Ca2+. Fusion of R-L coronary commissure. Cusp separatino mod reduced. Mild AI. Peak velocity (S) 245 cm/s. Mean gradient (S) 13mmHg.   . Diarrhea    occurs because of  dialysis  . DJD (degenerative joint disease)    right knee.  All joints  . ESRD (end stage renal disease) on dialysis ([facility]) 01/02/2012   [facility] [address], TTS schedule.  Started dialysis in 2000.     . Family history of disseminated HSV infection   . Genital herpes   . Hemodialysis patient [facility])    Tuesday, Thursday, Saturday  . History of blood transfusion   . HIV infection ([facility])   . Hypertension   . Pneumonia 1/20107   hx of  . Shortness of breath    "can happen at anytime" (03/04/2013).  03/14/16- "when I have too much fluid"  . Tertiary syphilis   . 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 Reclotted on 03/02/13 and IR placed L IJ tunneled HD catheter (a right external jugular tunneled HD cath was attempted first but did not function due to kinking across the clavicle). Pt was seen by VVS on same admission 6/614 and said RUA AVF no longer usable, they ordered vein mapping and will see him as outpatient to set up next permanent access.      Assessment:  52yoM with PMH ESRD and HIV presenting with shortness of breath. Pharmacy  dosing heparin for NSTEMI (no boluses per MD). No anticoagulation reported PTA.   Goal of Therapy:  Heparin level 0.3-0.7 units/ml Monitor platelets by anticoagulation protocol: Yes   Plan:  Increase heparin infusion to 1200 units/hr Check anti-Xa level in 6 hours and daily while on heparin Continue to monitor H&H and platelets Diagnostic cath scheduled for Monday  Harland German, PharmD Clinical Pharmacist Please check Amion for pharmacy contact number   Addendum -heparin level at goal after increase to 1200 units/hr  Plan  -No heparin changes -Daily heparin level and CBC  Harland German, PharmD Clinical Pharmacist Please check Amion for pharmacy contact number

## 2018-05-22 NOTE — Progress Notes (Signed)
Progress Note  Patient Name: [name] Date of Encounter: 05/22/2018  Primary Cardiologist: Thurmon FairMihai Croitoru, MD   Subjective   No chest pain or sob.   Inpatient Medications    Scheduled Meds: . aspirin  81 mg Oral Daily  . Chlorhexidine Gluconate Cloth  6 each Topical Q0600  . darunavir  800 mg Oral QHS  . dolutegravir  50 mg Oral QHS  . metoprolol tartrate  12.5 mg Oral BID  . [START ON 05/23/2018] pantoprazole  40 mg Oral BID  . ritonavir  100 mg Oral QHS  . rosuvastatin  5 mg Oral q1800  . [START ON 05/23/2018] tenofovir  300 mg Oral Once   Continuous Infusions: . heparin 1,000 Units/hr (05/22/18 0302)  . nitroGLYCERIN 5 mcg/min (05/09/2018 2224)   PRN Meds: acetaminophen, ondansetron (ZOFRAN) IV, polyvinyl alcohol   Vital Signs    Vitals:   05/07/2018 2215 05/22/18 0011 05/22/18 0500 05/22/18 0742  BP: 117/78 (!) 144/89 115/84 121/86  Pulse: 75 79 71 74  Resp:  20 16   Temp:  97.6 F (36.4 C) (!) 97.4 F (36.3 C) (!) 97.5 F (36.4 C)  TempSrc:  Oral Axillary Oral  SpO2:  95% 100% 100%  Weight:   64.2 kg   Height:        Intake/Output Summary (Last 24 hours) at 05/22/2018 1123 Last data filed at 05/22/2018 1100 Gross per 24 hour  Intake 390.06 ml  Output -  Net 390.06 ml   Filed Weights   05/19/2018 1418 05/20/2018 1830 05/22/18 0500  Weight: 66.9 kg 63.5 kg 64.2 kg    Telemetry    nsr - Personally Reviewed  ECG    nsr with inferolateral STT changes consistent with ischemia - Personally Reviewed  Physical Exam   GEN: No acute distress.   Neck: 7 cm JVD Cardiac: RRR, no murmurs, rubs, or gallops.  Respiratory: Clear to auscultation bilaterally. GI: Soft, nontender, non-distended  MS: No edema; No deformity. Neuro:  Nonfocal  Psych: Normal affect   Labs    Chemistry Recent Labs  Lab 05/28/2018 1436 05/22/18 0642  NA 140 139  K 4.6 5.0  CL 102 102  CO2 25 24  GLUCOSE 79 82  BUN 24* 31*  CREATININE 8.01* 9.00*  CALCIUM 9.9 9.6   PROT  --  7.7  ALBUMIN  --  3.1*  AST  --  14*  ALT  --  9  ALKPHOS  --  201*  BILITOT  --  2.6*  GFRNONAA 7* 6*  GFRAA 8* 7*  ANIONGAP 13 13     Hematology Recent Labs  Lab 05/01/2018 1436 05/22/18 0642  WBC 6.1 6.3  RBC 3.22* 3.16*  HGB 11.0* 10.8*  HCT 35.1* 34.0*  MCV 109.0* 107.6*  MCH 34.2* 34.2*  MCHC 31.3 31.8  RDW 17.9* 17.8*  PLT 78* 75*    Cardiac Enzymes Recent Labs  Lab 05/05/2018 1907 05/22/18 0043 05/22/18 0642  TROPONINI 1.31* 1.35* 1.43*    Recent Labs  Lab 05/17/2018 1455  TROPIPOC 0.86*     BNP Recent Labs  Lab 05/16/2018 1907  BNP >4,500.0*     DDimer No results for input(s): DDIMER in the last 168 hours.   Radiology    Dg Chest 2 View  Result Date: 05/07/2018 CLINICAL DATA:  Initial evaluation for acute shortness of breath since dialysis. EXAM: CHEST - 2 VIEW COMPARISON:  Prior radiograph from 10/20/2017. FINDINGS: Moderate cardiomegaly. Mediastinal silhouette within normal limits. Aortic  atherosclerosis. Lungs mildly hypoinflated. Diffuse vascular congestion with asymmetric right greater than left lung opacities, which could reflect asymmetric edema or possibly infiltrates. Small right pleural effusion. No pneumothorax. No acute osseous abnormality. Multiple vascular stents overlie the chest and bilateral arms. IMPRESSION: 1. Cardiomegaly with perihilar vascular congestion and right greater than left pulmonary opacities. Asymmetric edema is favored, although infiltrates could be considered in the correct clinical setting. 2. Small right pleural effusion. 3. Aortic atherosclerosis. Electronically Signed   By: Rise Mu M.D.   On: 05/16/2018 15:58    Cardiac Studies   Troponin elevated  Patient Profile     52 y.o. male admitted with NSTEMI  Assessment & Plan    1. NTEMI - agree with plans for left heart cath on Monday. 2. Aortic stenosis - he has mild/mod AS and is well compensated. 3. ESRD - he will continue HD     For  questions or updates, please contact [facility] Please consult [email or web address] for contact info under Cardiology/STEMI.      Signed, [name], MD  05/22/2018, 11:23 AM  Patient ID: [name], male   DOB: 17-Sep-1966, 52 y.o.   MRN: [id number]

## 2018-05-22 NOTE — Consult Note (Addendum)
[facility] Renal Consultation Note    Indication for Consultation:  Management of ESRD/hemodialysis; anemia, hypertension/volume and secondary hyperparathyroidism  HPI: [name] is a 52 y.o. male with ESRD on HD TTS at [facility], HIV, HTN, aortic stenosis, recurrent HSV, hx GIB, neuropathy.    Admitted for evaluation and treatment of NSTEMI. Presented to ED yesterday with chest pain, worsening DOE. Abnormal EKG with prolonged QT, ST segment depression. CXR showing pulm edema.  Labs significant for elevated troponins 1.31>1.43, BNP >4500, K 5.0. Evaluated by cardiology with plans for catheterization on Monday.   Seen in room. Sitting in recliner at bedside. On heparin gtt. Endorses SOB when getting up to go the bathroom.  Last dialysis was Thursday. He has been going to treatments and reaching his dry weight, but frequently cuts treatments shorts. He thinks he has lost weight and not getting enough fluid off at dialysis.    Past Medical History:  Diagnosis Date  . Anal condyloma   . Anemia   . Aortic stenosis    a. 12/2016 Echo: EF 55-60%, no rwma, possibly bicuspid AoV, mod thickened, mod Ca2+. Fusion of R-L coronary commissure. Cusp separatino mod reduced. Mild AI. Peak velocity (S) 245 cm/s. Mean gradient (S) .   . Diarrhea    occurs because of dialysis  . DJD (degenerative joint disease)    right knee.  All joints  . ESRD (end stage renal disease) on dialysis (HCC) 01/02/2012   Gets diaysis at [facility] on [employer], TTS schedule.  Started dialysis in 2000.     . Family history of disseminated HSV infection   . Genital herpes   . Hemodialysis patient [facility])    Tuesday, Thursday, Saturday  . History of blood transfusion   . HIV infection (HCC)   . Hypertension   . Pneumonia 1/20107   hx of  . Shortness of breath    "can happen at anytime" (03/04/2013).  03/14/16- "when I have too much fluid"  . Tertiary syphilis   .  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 Reclotted on 03/02/13 and IR placed L IJ tunneled HD catheter (a right external jugular tunneled HD cath was attempted first but did not function due to kinking across the clavicle). Pt was seen by VVS on same admission 6/614 and said RUA AVF no longer usable, they ordered vein mapping and will see him as outpatient to set up next permanent access.      Past Surgical History:  Procedure Laterality Date  . A/V SHUNTOGRAM Left 04/15/2017   Procedure: A/V Shuntogram;  Surgeon: Maeola Harman, MD;  Location: [facility] INVASIVE CV LAB;  Service: Cardiovascular;  Laterality: Left;  . APPENDECTOMY    . AV FISTULA PLACEMENT Right 06/16/11  . AV FISTULA PLACEMENT Left 03/17/2016   Procedure: INSERTION OF ARTERIOVENOUS (AV) GORE-TEX GRAFT ARM USING GORETEX 4-7MM X 45CM STRETCH GRAFT;  Surgeon: Sherren Kerns, MD;  Location: [facility] OR;  Service: Vascular;  Laterality: Left;  . AV FISTULA PLACEMENT, BRACHIOCEPHALIC Right 06/16/11  . ESOPHAGOGASTRODUODENOSCOPY N/A 12/09/2016   Procedure: ESOPHAGOGASTRODUODENOSCOPY (EGD);  Surgeon: Kathi Der, MD;  Location: [facility] ENDOSCOPY;  Service: Gastroenterology;  Laterality: N/A;  . ESOPHAGOGASTRODUODENOSCOPY N/A 01/05/2017   Procedure: ESOPHAGOGASTRODUODENOSCOPY (EGD);  Surgeon: Vida Rigger, MD;  Location: [facility] ENDOSCOPY;  Service: Endoscopy;  Laterality: N/A;  . ESOPHAGOGASTRODUODENOSCOPY (EGD) WITH PROPOFOL N/A 01/06/2017   Procedure: ESOPHAGOGASTRODUODENOSCOPY (EGD) WITH PROPOFOL;  Surgeon: Kathi Der, MD;  Location: [facility] ENDOSCOPY;  Service: Gastroenterology;  Laterality: N/A;  . INCISION AND DRAINAGE ABSCESS     abdominal  . PERIPHERAL VASCULAR CATHETERIZATION N/A 03/07/2016   Procedure: Venogram;  Surgeon: Sherren Kerns, MD;  Location: [facility] INVASIVE CV LAB;  Service: Cardiovascular;  Laterality: N/A;  . PERIPHERAL VASCULAR INTERVENTION Left 04/15/2017    Procedure: Peripheral Vascular Intervention;  Surgeon: Maeola Harman, MD;  Location: [facility] INVASIVE CV LAB;  Service: Cardiovascular;  Laterality: Left;  . REVISON OF ARTERIOVENOUS FISTULA Right 04/20/2013   Procedure: INSERTION OF ARTERIOVENOUS GORTEX GRAFT;  Surgeon: Sherren Kerns, MD;  Location: [facility] OR;  Service: Vascular;  Laterality: Right;  Ultrasound Guided  . TONSILLECTOMY     Family History  Problem Relation Age of Onset  . Diabetes Mother   . Arthritis Father        Gout- Foot   Social History:  reports that he has been smoking cigarettes. He has a 13.50 pack-year smoking history. He has never used smokeless tobacco. He reports that he does not drink alcohol or use drugs. Allergies  Allergen Reactions  . Iodinated Diagnostic Agents Hives, Itching and Rash    "skin rash - severe" per outside records  . Penicillins Hives and Other (See Comments)    Rx required Hospitalization Has patient had a PCN reaction causing immediate rash, facial/tongue/throat swelling, SOB or lightheadedness with hypotension: Yes Has patient had a PCN reaction causing severe rash involving mucus membranes or skin necrosis: No Has patient had a PCN reaction that required hospitalization Yes Has patient had a PCN reaction occurring within the last 10 years: No   . Shellfish-Derived Products Nausea Only    Irritates the stomach  . Acetaminophen-Codeine Nausea And Vomiting  . Tape Rash and Other (See Comments)    Use paper tape only (NO CLEAR PLASTIC TAPE!!)   Prior to Admission medications   Medication Sig Start Date End Date Taking? Authorizing Provider  albuterol (PROVENTIL HFA;VENTOLIN HFA) 108 (90 Base) MCG/ACT inhaler Inhale 2 puffs into the lungs every 6 (six) hours as needed for wheezing or shortness of breath.   Yes [provider]  calcium acetate (PHOSLO) 667 MG capsule Take 2,668 mg by mouth See admin instructions. Take 2,668 mg by mouth three times a day with meals and  2,668 mg with each snack   Yes [provider]  darunavir (PREZISTA) 800 MG tablet Take 1 tablet (800 mg total) by mouth at bedtime. 02/08/18  Yes Cliffton [name], MD  ibuprofen (ADVIL,MOTRIN) 200 MG tablet Take 600 mg by mouth every 6 (six) hours as needed (for pain or headaches).    Yes [provider]  Multiple Vitamins-Minerals (MULTIVITAMIN PO) Take 1 tablet by mouth daily.   Yes [provider]  ritonavir (NORVIR) 100 MG TABS tablet TAKE 1 TABLET BY MOUTH AT BEDTIME. TAKE WITH PREZISTA TABLET 02/08/18  Yes Cliffton [name], MD  tenofovir (VIREAD) 300 MG tablet TAKE 1 TABLET BY MOUTH ONCE A WEEK ON SUNDAYS AT BEDTIME 02/08/18  Yes Cliffton [name], MD  TIVICAY 50 MG tablet TAKE 1 TABLET(50 MG) BY MOUTH AT BEDTIME 02/08/18  Yes Cliffton [name], MD  acyclovir (ZOVIRAX) 400 MG tablet Take 400 mg tablet in evening after each HD session for the next week. Patient not taking: Reported on 04/29/2018 10/10/17   Arvilla Market, DO  darunavir (PREZISTA) 800 MG tablet TAKE 1 TABLET BY MOUTH AT BEDTIME WITH NORVIR Patient not taking: Reported on 05/13/2018 10/22/17  Cliffton [name], MD  docusate sodium (COLACE) 100 MG capsule Take 1 capsule (100 mg total) by mouth daily. Patient not taking: Reported on 04/30/2018 12/10/16   Renne Musca, MD  dolutegravir (TIVICAY) 50 MG tablet Take 1 tablet (50 mg total) by mouth at bedtime. Patient not taking: Reported on 05/05/2018 10/22/17   Cliffton [name], MD  hydrOXYzine (ATARAX/VISTARIL) 25 MG tablet Take 1 tablet (25 mg total) by mouth every 6 (six) hours as needed for itching. Patient not taking: Reported on 05/18/2018 10/28/17   Wendee Beavers, DO  pantoprazole (PROTONIX) 40 MG tablet TAKE 1 TABLET BY MOUTH TWICE DAILY Patient not taking: Reported on 05/04/2018 01/08/17   Almon Hercules, MD   Current Facility-Administered Medications  Medication Dose Route Frequency Provider Last Rate Last Dose  . 0.9 %  sodium chloride infusion  100 mL  Intravenous PRN Ejigiri, Megan Mans, PA-C      . 0.9 %  sodium chloride infusion  100 mL Intravenous PRN Ejigiri, Megan Mans, PA-C      . acetaminophen (TYLENOL) tablet 650 mg  650 mg Oral Q6H PRN Ellwood Dense, DO      . aspirin chewable tablet 81 mg  81 mg Oral Daily Creig Hines, NP   81 mg at 05/22/18 1125  . Chlorhexidine Gluconate Cloth 2 % PADS 6 each  6 each Topical Q0600 Tomasa Blase, PA-C      . darunavir (PREZISTA) tablet 800 mg  800 mg Oral QHS Ellwood Dense, DO   800 mg at 05/03/2018 2215  . dolutegravir (TIVICAY) tablet 50 mg  50 mg Oral QHS Ellwood Dense, DO   50 mg at 05/06/2018 2215  . heparin ADULT infusion 100 units/mL (25000 units/258mL sodium chloride 0.45%)  1,000 Units/hr Intravenous Continuous Sampson Si, RPH 10 mL/hr at 05/22/18 0302 1,000 Units/hr at 05/22/18 0302  . heparin injection 1,000 Units  1,000 Units Dialysis PRN Tomasa Blase, PA-C      . lidocaine (PF) (XYLOCAINE) 1 % injection 5 mL  5 mL Intradermal PRN Tomasa Blase, PA-C      . lidocaine-prilocaine (EMLA) cream 1 application  1 application Topical PRN Tomasa Blase, PA-C      . metoprolol tartrate (LOPRESSOR) tablet 12.5 mg  12.5 mg Oral BID Creig Hines, NP   12.5 mg at 05/06/2018 2215  . nitroGLYCERIN 50 mg in dextrose 5 % 250 mL (0.2 mg/mL) infusion  0-200 mcg/min Intravenous Titrated Ellwood Dense, DO 1.5 mL/hr at 05/05/2018 2224 5 mcg/min at 05/22/2018 2224  . ondansetron (ZOFRAN) injection 4 mg  4 mg Intravenous Q6H PRN Ellwood Dense, DO      . [START ON 05/23/2018] pantoprazole (PROTONIX) EC tablet 40 mg  40 mg Oral BID Meccariello, Solmon Ice, DO      . pentafluoroprop-tetrafluoroeth (GEBAUERS) aerosol 1 application  1 application Topical PRN Ejigiri, Megan Mans, PA-C      . polyvinyl alcohol (LIQUIFILM TEARS) 1.4 % ophthalmic solution 1 drop  1 drop Both Eyes QID PRN Meccariello, Solmon Ice, DO      . ritonavir (NORVIR) tablet 100  mg  100 mg Oral QHS Ellwood Dense, DO   100 mg at 05/10/2018 2216  . rosuvastatin (CRESTOR) tablet 5 mg  5 mg Oral q1800 Creig Hines, NP      . Melene Muller ON 05/23/2018] tenofovir (VIREAD) tablet 300 mg  300 mg Oral Once Ellwood Dense, DO         ROS: As  per HPI otherwise negative.  Physical Exam: Vitals:   05/22/18 0011 05/22/18 0500 05/22/18 0742 05/22/18 1123  BP: (!) 144/89 115/84 121/86 106/62  Pulse: 79 71 74 71  Resp: 20 16    Temp: 97.6 F (36.4 C) (!) 97.4 F (36.3 C) (!) 97.5 F (36.4 C) 97.6 F (36.4 C)  TempSrc: Oral Axillary Oral Oral  SpO2: 95% 100% 100% 100%  Weight:  64.2 kg    Height:         General: WDWN male NAD  Head: NCAT sclera not icteric MMM Neck: Supple. No masses  Lungs:  Grossly clear, occ faint crackles Heart: RRR 2/6 systolic murmur  Abdomen: soft NT + BS Lower extremities: Trace LE edema  Neuro: A & O  X 3. Moves all extremities spontaneously. Psych:  Responds to questions appropriately with a normal affect. Dialysis Access: LUE AVG +bruit   Labs: Basic Metabolic Panel: Recent Labs  Lab 05/06/2018 1436 05/22/18 0642  NA 140 139  K 4.6 5.0  CL 102 102  CO2 25 24  GLUCOSE 79 82  BUN 24* 31*  CREATININE 8.01* 9.00*  CALCIUM 9.9 9.6   Liver Function Tests: Recent Labs  Lab 05/22/18 0642  AST 14*  ALT 9  ALKPHOS 201*  BILITOT 2.6*  PROT 7.7  ALBUMIN 3.1*   No results for input(s): LIPASE, AMYLASE in the last 168 hours. No results for input(s): AMMONIA in the last 168 hours. CBC: Recent Labs  Lab 05/09/2018 1436 05/22/18 0642  WBC 6.1 6.3  HGB 11.0* 10.8*  HCT 35.1* 34.0*  MCV 109.0* 107.6*  PLT 78* 75*   Cardiac Enzymes: Recent Labs  Lab 05/20/2018 1907 05/22/18 0043 05/22/18 0642  TROPONINI 1.31* 1.35* 1.43*   CBG: No results for input(s): GLUCAP in the last 168 hours. Iron Studies: No results for input(s): IRON, TIBC, TRANSFERRIN, FERRITIN in the last 72 hours. Studies/Results: Dg Chest 2  View  Result Date: 05/26/2018 CLINICAL DATA:  Initial evaluation for acute shortness of breath since dialysis. EXAM: CHEST - 2 VIEW COMPARISON:  Prior radiograph from 10/20/2017. FINDINGS: Moderate cardiomegaly. Mediastinal silhouette within normal limits. Aortic atherosclerosis. Lungs mildly hypoinflated. Diffuse vascular congestion with asymmetric right greater than left lung opacities, which could reflect asymmetric edema or possibly infiltrates. Small right pleural effusion. No pneumothorax. No acute osseous abnormality. Multiple vascular stents overlie the chest and bilateral arms. IMPRESSION: 1. Cardiomegaly with perihilar vascular congestion and right greater than left pulmonary opacities. Asymmetric edema is favored, although infiltrates could be considered in the correct clinical setting. 2. Small right pleural effusion. 3. Aortic atherosclerosis. Electronically Signed   By: Rise Mu M.D.   On: 05/05/2018 15:58    Dialysis Orders:  [facility] TTS 3.5h 400/800 EDW 66kg 2K/2.5Ca Profile 4 Na Linear L AVG No heparin  -Hectorol 3 mcg IV TIW -Venofer 50mg  IV q weeks  -Mircera IV q 2 weeks last 8/20  -Ca acetate 4 tid qac   Assessment/Plan: 1. CP/Dyspnea -evaluating for NSTEMI - w/u per cards/primary team - per notes planning cath for Monday  2. ESRD -  HD TTS. For HD today  3. Hypertension/volume  - BP low/stable. No BP meds. Excess volume likely contributing to above symptoms - Reports weight loss. Below EDW by weights here with signs of volume excess. Will try to push UF today and get volume down  4. Anemia  - Hgb 10.8 - stable on ESA as outpatient  5. Metabolic bone disease -  Corr Ca  elevated. Use 2Ca bath.  Follow trends.  May need non-Ca based binder.  6. Nutrition - renal diet/vitamins/prostat for low albumin  7. Aortic stenosis - per cards/Echo pending 8. HIV - cont outpatient meds   Tomasa Blasegechi Grace Ejigiri PA-C [facility] Pager [phone] 05/22/2018,  12:00 PM   Pt seen, examined and agree w A/P as above. ESRD pt w/ SOB/ CP and pulm edema.  Under dry wt, will do HD today for vol overload and lower dry wt accordingly.  Vinson Moselleob Adele Milson MD [facility] pager [phone]   05/22/2018, 12:56 PM

## 2018-05-22 NOTE — Procedures (Signed)
Second attempt at echo. Patient not yet back from dialysis.

## 2018-05-22 NOTE — Procedures (Signed)
Attempted echo. Patient not in room. Will return later.

## 2018-05-22 NOTE — Progress Notes (Signed)
ANTICOAGULATION CONSULT NOTE  Pharmacy Consult for heparin Indication: chest pain/ACS  Allergies  Allergen Reactions  . Iodinated Diagnostic Agents Hives, Itching and Rash    "skin rash - severe" per outside records  . Penicillins Hives and Other (See Comments)    Rx required Hospitalization Has patient had a PCN reaction causing immediate rash, facial/tongue/throat swelling, SOB or lightheadedness with hypotension: Yes Has patient had a PCN reaction causing severe rash involving mucus membranes or skin necrosis: No Has patient had a PCN reaction that required hospitalization Yes Has patient had a PCN reaction occurring within the last 10 years: No   . Shellfish-Derived Products Nausea Only    Irritates the stomach  . Acetaminophen-Codeine Nausea And Vomiting  . Tape Rash and Other (See Comments)    Use paper tape only (NO CLEAR PLASTIC TAPE!!)    Patient Measurements: Height: 5\' 11"  (180.3 cm) Weight: 139 lb 15.9 oz (63.5 kg) IBW/kg (Calculated) : 75.3 Heparin Dosing Weight: 66.7 Kg  Vital Signs: Temp: 97.6 F (36.4 C) (08/24 0011) Temp Source: Oral (08/24 0011) BP: 144/89 (08/24 0011) Pulse Rate: 79 (08/24 0011)  Labs: Recent Labs    05/02/2018 1436 05/12/2018 1907 05/22/18 0043  HGB 11.0*  --   --   HCT 35.1*  --   --   PLT 78*  --   --   HEPARINUNFRC  --   --  <0.10*  CREATININE 8.01*  --   --   TROPONINI  --  1.31*  --     Estimated Creatinine Clearance: 9.9 mL/min (A) (by C-G formula based on SCr of 8.01 mg/dL (H)).   Medical History: Past Medical History:  Diagnosis Date  . Anal condyloma   . Anemia   . Aortic stenosis    a. 12/2016 Echo: EF 55-60%, no rwma, possibly bicuspid AoV, mod thickened, mod Ca2+. Fusion of R-L coronary commissure. Cusp separatino mod reduced. Mild AI. Peak velocity (S) 245 cm/s. Mean gradient (S) .   . Diarrhea    occurs because of dialysis  . DJD (degenerative joint disease)    right knee.  All joints  . ESRD (end  stage renal disease) on dialysis (HCC) 01/02/2012   Gets diaysis at [facility] on [employer], TTS schedule.  Started dialysis in 2000.     . Family history of disseminated HSV infection   . Genital herpes   . Hemodialysis patient [facility])    Tuesday, Thursday, Saturday  . History of blood transfusion   . HIV infection (HCC)   . Hypertension   . Pneumonia 1/20107   hx of  . Shortness of breath    "can happen at anytime" (03/04/2013).  03/14/16- "when I have too much fluid"  . Tertiary syphilis   . 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 Reclotted on 03/02/13 and IR placed L IJ tunneled HD catheter (a right external jugular tunneled HD cath was attempted first but did not function due to kinking across the clavicle). Pt was seen by VVS on same admission 6/614 and said RUA AVF no longer usable, they ordered vein mapping and will see him as outpatient to set up next permanent access.      Assessment:  52yoM with PMH ESRD and HIV presenting with shortness of breath. No anticoagulation reported PTA. Patient noted to have Hgb 11.0 and PLT 78. The provider would like to avoid heparin bolus and initiate IV heparin  Patient is currently on IV heparin at 800 units/hr an initial HL that is undetectable. No s/s of bleeding per RN   Goal of Therapy:  Heparin level 0.3-0.7 units/ml Monitor platelets by anticoagulation protocol: Yes   Plan:  Increase heparin infusion to 1000 units/hr Check anti-Xa level in 6 hours and daily while on heparin Continue to monitor H&H and platelets Diagnostic cath scheduled for Monday  Vinnie LevelBenjamin Leianna Barga, PharmD., BCPS Clinical Pharmacist

## 2018-05-22 NOTE — Procedures (Signed)
   I was present at this dialysis session, have reviewed the session itself and made  appropriate changes Rob Nikole Swartzentruber MD [facility] pager [phone]   05/22/2018, 4:18 PM    

## 2018-05-23 ENCOUNTER — Inpatient Hospital Stay (HOSPITAL_COMMUNITY): Payer: Medicare Other

## 2018-05-23 DIAGNOSIS — R079 Chest pain, unspecified: Secondary | ICD-10-CM

## 2018-05-23 LAB — ECHOCARDIOGRAM COMPLETE
Height: 71 in
WEIGHTICAEL: 2081.6 [oz_av]

## 2018-05-23 LAB — CBC
HEMATOCRIT: 34.1 % — AB (ref 39.0–52.0)
Hemoglobin: 10.8 g/dL — ABNORMAL LOW (ref 13.0–17.0)
MCH: 33.6 pg (ref 26.0–34.0)
MCHC: 31.7 g/dL (ref 30.0–36.0)
MCV: 106.2 fL — AB (ref 78.0–100.0)
Platelets: 70 10*3/uL — ABNORMAL LOW (ref 150–400)
RBC: 3.21 MIL/uL — AB (ref 4.22–5.81)
RDW: 17.6 % — AB (ref 11.5–15.5)
WBC: 5.8 10*3/uL (ref 4.0–10.5)

## 2018-05-23 LAB — HEPARIN LEVEL (UNFRACTIONATED)
Heparin Unfractionated: 1.03 IU/mL — ABNORMAL HIGH (ref 0.30–0.70)
Heparin Unfractionated: 0.27 IU/mL — ABNORMAL LOW (ref 0.30–0.70)

## 2018-05-23 LAB — FOLATE: FOLATE: 9 ng/mL (ref >5.9)

## 2018-05-23 LAB — TROPONIN I: Troponin I: 1.19 ng/mL (ref <0.03)

## 2018-05-23 LAB — BASIC METABOLIC PANEL
Anion gap: 14 (ref 5–15)
BUN: 18 mg/dL (ref 6–20)
CO2: 26 mmol/L (ref 22–32)
Calcium: 9.9 mg/dL (ref 8.9–10.3)
Chloride: 100 mmol/L (ref 98–111)
Creatinine, Ser: 6.48 mg/dL — ABNORMAL HIGH (ref 0.61–1.24)
GFR calc Af Amer: 10 mL/min — ABNORMAL LOW (ref >60)
GFR calc non Af Amer: 9 mL/min — ABNORMAL LOW (ref >60)
Glucose, Bld: 88 mg/dL (ref 70–99)
POTASSIUM: 4.2 mmol/L (ref 3.5–5.1)
SODIUM: 140 mmol/L (ref 135–145)

## 2018-05-23 LAB — VITAMIN B12: VITAMIN B 12: 388 pg/mL (ref 180–914)

## 2018-05-23 MED ORDER — DOLUTEGRAVIR SODIUM 50 MG PO TABS
50.0000 mg | ORAL_TABLET | Freq: Every day | ORAL | Status: DC
  Administered 2018-05-23 – 2018-06-16 (×25): 50 mg via ORAL
  Filled 2018-05-23 (×26): qty 1

## 2018-05-23 MED ORDER — ASPIRIN 81 MG PO CHEW
81.0000 mg | CHEWABLE_TABLET | ORAL | Status: AC
  Administered 2018-05-24: 81 mg via ORAL
  Filled 2018-05-23: qty 1

## 2018-05-23 MED ORDER — SODIUM CHLORIDE 0.9 % IV SOLN
INTRAVENOUS | Status: DC
  Administered 2018-05-24: 06:00:00 via INTRAVENOUS

## 2018-05-23 MED ORDER — ASPIRIN 81 MG PO CHEW: 81.0000 mg | CHEWABLE_TABLET | Freq: Every day | ORAL | Status: DC

## 2018-05-23 MED ORDER — DARUNAVIR ETHANOLATE 800 MG PO TABS
800.0000 mg | ORAL_TABLET | Freq: Every day | ORAL | Status: DC
Start: 2018-05-23 — End: 2018-06-09
  Administered 2018-05-23 – 2018-06-08 (×17): 800 mg via ORAL
  Filled 2018-05-23 (×18): qty 1

## 2018-05-23 MED ORDER — HEPARIN (PORCINE) IN NACL 100-0.45 UNIT/ML-% IJ SOLN
1200.0000 [IU]/h | INTRAMUSCULAR | Status: DC
  Administered 2018-05-23 (×2): 1100 [IU]/h via INTRAVENOUS
  Filled 2018-05-23 (×2): qty 250

## 2018-05-23 MED ORDER — GABAPENTIN 100 MG PO CAPS
100.0000 mg | ORAL_CAPSULE | Freq: Every day | ORAL | Status: DC
  Administered 2018-05-23 – 2018-06-06 (×15): 100 mg via ORAL
  Filled 2018-05-23 (×17): qty 1

## 2018-05-23 MED ORDER — DOLUTEGRAVIR SODIUM 50 MG PO TABS
50.0000 mg | ORAL_TABLET | Freq: Every day | ORAL | Status: DC
  Filled 2018-05-23 (×2): qty 1

## 2018-05-23 MED ORDER — SODIUM CHLORIDE 0.9% FLUSH
3.0000 mL | Freq: Two times a day (BID) | INTRAVENOUS | Status: DC
  Administered 2018-05-23: 3 mL via INTRAVENOUS

## 2018-05-23 MED ORDER — RITONAVIR 100 MG PO TABS
100.0000 mg | ORAL_TABLET | Freq: Every day | ORAL | Status: DC
  Administered 2018-05-23 – 2018-06-06 (×15): 100 mg via ORAL
  Filled 2018-05-23 (×16): qty 1

## 2018-05-23 MED ORDER — DARUNAVIR ETHANOLATE 800 MG PO TABS
800.0000 mg | ORAL_TABLET | Freq: Every day | ORAL | Status: DC
  Filled 2018-05-23 (×2): qty 1

## 2018-05-23 MED ORDER — SODIUM CHLORIDE 0.9% FLUSH: 3.0000 mL | INTRAVENOUS | Status: DC | PRN

## 2018-05-23 MED ORDER — SODIUM CHLORIDE 0.9 % IV SOLN: 250.0000 mL | INTRAVENOUS | Status: DC | PRN

## 2018-05-23 MED ORDER — RITONAVIR 100 MG PO TABS
100.0000 mg | ORAL_TABLET | Freq: Every day | ORAL | Status: DC
  Filled 2018-05-23 (×2): qty 1

## 2018-05-23 NOTE — Progress Notes (Addendum)
  Echocardiogram 2D Echocardiogram has been performed.  Gerda Dissrthur L Haralambos Yeatts 05/23/2018, 11:44 AM

## 2018-05-23 NOTE — Progress Notes (Signed)
Progress Note  Patient Name: Caleb BaldingJeffrey E Inscore Date of Encounter: 05/23/2018  Primary Cardiologist: Thurmon FairMihai Croitoru, MD   Subjective   Dyspnea a bit better after HD yesterday.  Inpatient Medications    Scheduled Meds: . aspirin  81 mg Oral Daily  . [START ON 05/06/2018] darunavir  800 mg Oral Q breakfast  . [START ON 05/03/2018] dolutegravir  50 mg Oral Q breakfast  . gabapentin  100 mg Oral Daily  . metoprolol tartrate  12.5 mg Oral BID  . pantoprazole  40 mg Oral BID  . [START ON 05/22/2018] ritonavir  100 mg Oral Q breakfast  . rosuvastatin  5 mg Oral q1800  . tenofovir  300 mg Oral Once   Continuous Infusions: . heparin Stopped (05/23/18 0937)  . nitroGLYCERIN Stopped (05/22/18 1237)   PRN Meds: acetaminophen, ondansetron (ZOFRAN) IV, polyvinyl alcohol   Vital Signs    Vitals:   05/22/18 2037 05/23/18 0026 05/23/18 0506 05/23/18 0820  BP: (!) 117/58 112/76 119/74 100/62  Pulse: 75 71 69   Resp: 20 16 (!) 22   Temp: 98.6 F (37 C) 98.1 F (36.7 C) 97.9 F (36.6 C)   TempSrc: Oral     SpO2: 100% 100% 98%   Weight:   59 kg   Height:        Intake/Output Summary (Last 24 hours) at 05/23/2018 1011 Last data filed at 05/22/2018 1819 Gross per 24 hour  Intake 516.06 ml  Output 1680 ml  Net -1163.94 ml   Filed Weights   05/22/18 1253 05/22/18 1618 05/23/18 0506  Weight: 65.6 kg 64 kg 59 kg    Telemetry    nsr - Personally Reviewed  ECG    none - Personally Reviewed  Physical Exam   GEN: thin, 52 yo man, No acute distress.   Neck: 6 cm JVD Cardiac: RRR, no murmurs, rubs, or gallops.  Respiratory: Clear to auscultation bilaterally. GI: Soft, nontender, non-distended  MS: No edema; No deformity. Neuro:  Nonfocal  Psych: Normal affect   Labs    Chemistry Recent Labs  Lab 25-Nov-2017 1436 05/22/18 0642 05/23/18 0248  NA 140 139 140  K 4.6 5.0 4.2  CL 102 102 100  CO2 25 24 26   GLUCOSE 79 82 88  BUN 24* 31* 18  CREATININE 8.01* 9.00*  6.48*  CALCIUM 9.9 9.6 9.9  PROT  --  7.7  --   ALBUMIN  --  3.1*  --   AST  --  14*  --   ALT  --  9  --   ALKPHOS  --  201*  --   BILITOT  --  2.6*  --   GFRNONAA 7* 6* 9*  GFRAA 8* 7* 10*  ANIONGAP 13 13 14      Hematology Recent Labs  Lab 25-Nov-2017 1436 05/22/18 0642 05/23/18 0248  WBC 6.1 6.3 5.8  RBC 3.22* 3.16* 3.21*  HGB 11.0* 10.8* 10.8*  HCT 35.1* 34.0* 34.1*  MCV 109.0* 107.6* 106.2*  MCH 34.2* 34.2* 33.6  MCHC 31.3 31.8 31.7  RDW 17.9* 17.8* 17.6*  PLT 78* 75* 70*    Cardiac Enzymes Recent Labs  Lab 05/22/18 0043 05/22/18 0642 05/22/18 2036 05/23/18 0248  TROPONINI 1.35* 1.43* 1.07* 1.19*    Recent Labs  Lab 25-Nov-2017 1455  TROPIPOC 0.86*     BNP Recent Labs  Lab 25-Nov-2017 1907  BNP >4,500.0*     DDimer No results for input(s): DDIMER in the last 168 hours.  Radiology    Dg Chest 2 View  Result Date: 05/12/2018 CLINICAL DATA:  Initial evaluation for acute shortness of breath since dialysis. EXAM: CHEST - 2 VIEW COMPARISON:  Prior radiograph from 10/20/2017. FINDINGS: Moderate cardiomegaly. Mediastinal silhouette within normal limits. Aortic atherosclerosis. Lungs mildly hypoinflated. Diffuse vascular congestion with asymmetric right greater than left lung opacities, which could reflect asymmetric edema or possibly infiltrates. Small right pleural effusion. No pneumothorax. No acute osseous abnormality. Multiple vascular stents overlie the chest and bilateral arms. IMPRESSION: 1. Cardiomegaly with perihilar vascular congestion and right greater than left pulmonary opacities. Asymmetric edema is favored, although infiltrates could be considered in the correct clinical setting. 2. Small right pleural effusion. 3. Aortic atherosclerosis. Electronically Signed   By: Rise Mu M.D.   On: 05/15/2018 15:58    Cardiac Studies   none  Patient Profile     52 y.o. male admitted with a NSTEMI  Assessment & Plan    1. NSTEMI - he is  schedule for left heart cath tomorrow. Continue current meds. 2. ESRD - he is s/p HD. His volume status is better 3.  AS - this is thought to be mild on echo.     For questions or updates, please contact [facility] Please consult [email or web address] for contact info under Cardiology/STEMI.      Signed, [name], MD  05/23/2018, 10:11 AM  Patient ID: [name], male   DOB: 14-Aug-1966, 52 y.o.   MRN: [id number]

## 2018-05-23 NOTE — Progress Notes (Signed)
ANTICOAGULATION CONSULT NOTE  Pharmacy Consult for heparin Indication: chest pain/ACS  Allergies  Allergen Reactions  . Iodinated Diagnostic Agents Hives, Itching and Rash    "skin rash - severe" per outside records  . Penicillins Hives and Other (See Comments)    Rx required Hospitalization Has patient had a PCN reaction causing immediate rash, facial/tongue/throat swelling, SOB or lightheadedness with hypotension: Yes Has patient had a PCN reaction causing severe rash involving mucus membranes or skin necrosis: No Has patient had a PCN reaction that required hospitalization Yes Has patient had a PCN reaction occurring within the last 10 years: No   . Shellfish-Derived Products Nausea Only    Irritates the stomach  . Acetaminophen-Codeine Nausea And Vomiting  . Tape Rash and Other (See Comments)    Use paper tape only (NO CLEAR PLASTIC TAPE!!)    Patient Measurements: Height: 5\' 11"  (180.3 cm) Weight: 130 lb 1.6 oz (59 kg) IBW/kg (Calculated) : 75.3 Heparin Dosing Weight: 66.7 Kg  Vital Signs: Temp: 97.9 F (36.6 C) (08/25 0506) BP: 100/62 (08/25 0820) Pulse Rate: 69 (08/25 0506)  Labs: Recent Labs    05/04/2018 1436  05/22/18 0642 05/22/18 1104 05/22/18 2036 05/23/18 0248 05/23/18 0806  HGB 11.0*  --  10.8*  --   --  10.8*  --   HCT 35.1*  --  34.0*  --   --  34.1*  --   PLT 78*  --  75*  --   --  70*  --   HEPARINUNFRC  --    < >  --  0.17* 0.37  --  1.03*  CREATININE 8.01*  --  9.00*  --   --  6.48*  --   TROPONINI  --    < > 1.43*  --  1.07* 1.19*  --    < > = values in this interval not displayed.    Estimated Creatinine Clearance: 11.4 mL/min (A) (by C-G formula based on SCr of 6.48 mg/dL (H)).   Medical History: Past Medical History:  Diagnosis Date  . Anal condyloma   . Anemia   . Aortic stenosis    a. 12/2016 Echo: EF 55-60%, no rwma, possibly bicuspid AoV, mod thickened, mod Ca2+. Fusion of R-L coronary commissure. Cusp separatino mod reduced.  Mild AI. Peak velocity (S) 245 cm/s. Mean gradient (S) .   . Diarrhea    occurs because of dialysis  . DJD (degenerative joint disease)    right knee.  All joints  . ESRD (end stage renal disease) on dialysis (HCC) 01/02/2012   Gets diaysis at [facility] on [employer], TTS schedule.  Started dialysis in 2000.     . Family history of disseminated HSV infection   . Genital herpes   . Hemodialysis patient [facility])    Tuesday, Thursday, Saturday  . History of blood transfusion   . HIV infection (HCC)   . Hypertension   . Pneumonia 1/20107   hx of  . Shortness of breath    "can happen at anytime" (03/04/2013).  03/14/16- "when I have too much fluid"  . Tertiary syphilis   . 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 Reclotted on 03/02/13 and IR placed L IJ tunneled HD catheter (a right external jugular tunneled HD cath was attempted first but did not function due to kinking across the clavicle). Pt was seen by [facility] on same admission 6/614 and said RUA AVF no  longer usable, they ordered vein mapping and will see him as outpatient to set up next permanent access.      Assessment:  52yoM with PMH ESRD and HIV presenting with shortness of breath. Pharmacy dosing heparin for NSTEMI (no boluses per MD). No anticoagulation reported PTA.   Heparin level was supra therapeutic this morning (1.03). Seems to have been drawn correctly after talking to patient.  No bleeding per patient.  H&H 10.8/34.1 platelets 70.    Goal of Therapy:  Heparin level 0.3-0.7 units/ml Monitor platelets by anticoagulation protocol: Yes   Plan:  Hold heparin 1hr  Restart heparin infusion at 1100 units/hr  Check anti-Xa level in 6 hours and daily while on heparin Continue to monitor H&H and platelets Diagnostic cath scheduled for Monday  Ewing Schleinolton Ulah Olmo, PharmD PGY1 Pharmacy Resident 05/23/2018    10:10 AM

## 2018-05-23 NOTE — Progress Notes (Addendum)
ANTICOAGULATION CONSULT NOTE  Pharmacy Consult for heparin Indication: chest pain/ACS  Allergies  Allergen Reactions  . Iodinated Diagnostic Agents Hives, Itching and Rash    "skin rash - severe" per outside records  . Penicillins Hives and Other (See Comments)    Rx required Hospitalization Has patient had a PCN reaction causing immediate rash, facial/tongue/throat swelling, SOB or lightheadedness with hypotension: Yes Has patient had a PCN reaction causing severe rash involving mucus membranes or skin necrosis: No Has patient had a PCN reaction that required hospitalization Yes Has patient had a PCN reaction occurring within the last 10 years: No   . Shellfish-Derived Products Nausea Only    Irritates the stomach  . Acetaminophen-Codeine Nausea And Vomiting  . Tape Rash and Other (See Comments)    Use paper tape only (NO CLEAR PLASTIC TAPE!!)   Patient Measurements: Height: 5\' 11"  (180.3 cm) Weight: 130 lb 1.6 oz (59 kg) IBW/kg (Calculated) : 75.3 Heparin Dosing Weight: 66.7 Kg  Vital Signs: Temp: 97.3 F (36.3 C) (08/25 1300) Temp Source: Axillary (08/25 1300) BP: 104/68 (08/25 1300) Pulse Rate: 62 (08/25 1300)  Labs: Recent Labs    05/01/2018 1436  05/22/18 0642  05/22/18 2036 05/23/18 0248 05/23/18 0806 05/23/18 1654  HGB 11.0*  --  10.8*  --   --  10.8*  --   --   HCT 35.1*  --  34.0*  --   --  34.1*  --   --   PLT 78*  --  75*  --   --  70*  --   --   HEPARINUNFRC  --    < >  --    < > 0.37  --  1.03* 0.27*  CREATININE 8.01*  --  9.00*  --   --  6.48*  --   --   TROPONINI  --    < > 1.43*  --  1.07* 1.19*  --   --    < > = values in this interval not displayed.   Estimated Creatinine Clearance: 11.4 mL/min (A) (by C-G formula based on SCr of 6.48 mg/dL (H)).  Assessment:  [age] yoM with PMH ESRD presenting with SOB. Pharmacy consulted to dose IV heparin for NSTEMI (no boluses per MD). No AC PTA. Heparin level now slightly below goal at 0.27 after held x1 hour  and rate decrease. No infusion issues or interruptions confirmed with RN. Suspect previous supratherapeutic level this AM may have been a collection or lab error. Hgb 10.8, pltc 70s stable. No bleeding noted.  Goal of Therapy:  Heparin level 0.3-0.7 units/ml Monitor platelets by anticoagulation protocol: Yes   Plan:  Increase heparin infusion to 1200 units/hr  Recheck heparin level with AM labs Daily heparin level and CBC Monitor s/sx of bleeding  Erin N. Zigmund Danieleja, PharmD PGY2 Infectious Diseases Pharmacy Resident Phone: [phone] 05/23/2018  5:56 PM

## 2018-05-23 NOTE — Progress Notes (Addendum)
[facility] Progress Note   Subjective:  Seen in room - sitting in recliner Feels better today. HD yesterday 1.6L off S/o early with shoulder pain   Objective Vitals:   05/22/18 2037 05/23/18 0026 05/23/18 0506 05/23/18 0820  BP: (!) 117/58 112/76 119/74 100/62  Pulse: 75 71 69   Resp: 20 16 (!) 22   Temp: 98.6 F (37 C) 98.1 F (36.7 C) 97.9 F (36.6 C)   TempSrc: Oral     SpO2: 100% 100% 98%   Weight:   59 kg   Height:       Physical Exam General: Thin male NAD  Heart: RRR 2/6 systolic murmur  Lungs: CTAB  Abdomen: soft NT +BS Extremities: No LE edema  Dialysis Access: LUE AVG +bruit   Dialysis Orders:  [facility] 3.5h 400/800 EDW 66kg 2K/2.5Ca Profile 4 Na Linear L AVG No heparin  -Hectorol 3 mcg IV TIW -Venofer 50mg  IV q weeks  -Mircera 100mcg IV q 2 weeks last 8/20  -Ca acetate 4 tid qac   Assessment/Plan: 1. NSTEMI - w/u per cards/primary team - per notes planning cath for Monday  2. ESRD -  HD TTS. Next HD 8/27  3. Hypertension/volume  - BP low/stable. On metoprolol. Net UF 1.6L with HD yesterday Post wt 64kg. He reports weight loss and is now below EDW. Feels better with volume off - Plan to lower EDW at discharge.  4. Anemia  - Hgb 10.8 - stable on ESA as outpatient  5. Metabolic bone disease -  Corr Ca elevated. Use 2Ca bath.  Follow trends.  May need non-Ca based binder.  6. Nutrition - renal diet/vitamins/prostat for low albumin  7. Aortic stenosis - per cards/Echo pending 8. Thrombocytopenia - appears chronic, PTA   9. HIV - cont outpatient meds   Tomasa Blasegechi Grace Ejigiri PA-C [facility] Pager [phone] 05/23/2018,9:54 AM  LOS: 2 days   Pt seen, examined and agree w A/P as above.  Vinson Moselleob Vibhav Waddill MD [facility] pager [phone]   05/23/2018, 12:05 PM    Additional Objective Labs: Basic Metabolic Panel: Recent Labs  Lab 05/26/2018 1436 05/22/18 0642 05/23/18 0248  NA 140 139 140  K 4.6 5.0 4.2   CL 102 102 100  CO2 25 24 26   GLUCOSE 79 82 88  BUN 24* 31* 18  CREATININE 8.01* 9.00* 6.48*  CALCIUM 9.9 9.6 9.9   CBC: Recent Labs  Lab 05/10/2018 1436 05/22/18 0642 05/23/18 0248  WBC 6.1 6.3 5.8  HGB 11.0* 10.8* 10.8*  HCT 35.1* 34.0* 34.1*  MCV 109.0* 107.6* 106.2*  PLT 78* 75* 70*   Blood Culture    Component Value Date/Time   SDES SPUTUM 10/15/2015 0028   SDES SPUTUM 10/15/2015 0028   SPECREQUEST NONE 10/15/2015 0028   SPECREQUEST NONE 10/15/2015 0028   CULT  10/15/2015 0028    MODERATE STAPHYLOCOCCUS AUREUS Note: RIFAMPIN AND GENTAMICIN SHOULD NOT BE USED AS SINGLE DRUGS FOR TREATMENT OF STAPH INFECTIONS. This organism DOES NOT demonstrate inducible Clindamycin resistance in vitro. Performed at [facility]    REPTSTATUS 10/15/2015 FINAL 10/15/2015 0028   REPTSTATUS 10/18/2015 FINAL 10/15/2015 0028    Cardiac Enzymes: Recent Labs  Lab 05/18/2018 1907 05/22/18 0043 05/22/18 0642 05/22/18 2036 05/23/18 0248  TROPONINI 1.31* 1.35* 1.43* 1.07* 1.19*   CBG: No results for input(s): GLUCAP in the last 168 hours. Iron Studies: No results for input(s): IRON, TIBC, TRANSFERRIN, FERRITIN in the last 72 hours. Lab Results  Component Value Date   INR 1.25 01/02/2017   INR 1.07 12/07/2016   INR 0.98 08/04/2015   Medications: . heparin Stopped (05/23/18 0937)  . nitroGLYCERIN Stopped (05/22/18 1237)   . aspirin  81 mg Oral Daily  . [START ON 05/17/2018] darunavir  800 mg Oral Q breakfast  . [START ON 05/23/2018] dolutegravir  50 mg Oral Q breakfast  . gabapentin  100 mg Oral Daily  . metoprolol tartrate  12.5 mg Oral BID  . pantoprazole  40 mg Oral BID  . [START ON 05/19/2018] ritonavir  100 mg Oral Q breakfast  . rosuvastatin  5 mg Oral q1800  . tenofovir  300 mg Oral Once

## 2018-05-23 NOTE — Progress Notes (Signed)
Family Medicine Teaching Service Daily Progress Note Intern Pager: [phone]  Patient name: [name] Medical record number: [id number] Date of birth: 11-28-1965 Age: 52 y.o. Gender: male  Primary Care Provider: Patient, No Pcp Per Consultants: Cards Code Status: Full   Pt Overview and Major Events to Date:  8/23 admitted to [facility] 8/26 heart cath  Assessment and Plan: [name] is a 52 y.o. male presenting with chest pain and feeling unwell. PMH is significant for HIV, ESRD, HSV, anemia, peripheral neuropathy, anemia, GI bleed, PUD, AS.  NSTEMI: Stable Cardiology started ASA 14m QD, Beta-blocker, high intensity stain.  Will cath Monday.  Trops 0.86>1.31>1.35.  BNP >4500. Mag 2.2. TSH 1.763. A1c 4.5. Overnight vitals stable, no chest pain while in bed. This AM no complaints. - cards consulted, appreciate recs - cath planned for 8/26 - cont hep drip - d/c nitro drip - cont ASA 8734m- cont lopressor 12.34m7mID - cont Crestor 34mg61m - cont cardiac monitoring and continuous pulse ox  Post herpetic lesion patient is complaining of intense pain along prior shingles scarring and acutely tender to palpation. Ordered renal dose appropriate gabapentin -gabapentin 100mg46mly -monitor pain  Systolic murmur: Chronic History of aortic stenosis.  Echo pending. - f/u Echo results scheduled 8/25 - cards consulted, appreciate recs  ESRD on HD electrolytes WNL TTS schedule. - had HD saturday -nephro following for dialysis  HTN: Stable  No home meds.  BP this AM 100/62 - cont to monitor vitals  HLD: Chronic Lipid Panel normal. LFTs showed AST 14, ALT 9, Alk phos 201 - cont crestor 34mg Q434mHIV: Chronic, well-controlled Last CD4 count 780, viral load undetectable. - cont home meds  HSV: Chronic, stable On Acyclovir prn.  No current outbreak. - consider acyclovir prn if symptoms arise during hospitalization  PUD: Stable Hx GI in 2018.  On protonix 40mg B634mt  home.  Hgb baseline ~10.  Hgb pending this AM. Continues to deny melena and BRBPR.  - f/u CBC this AM - cont home protonix - cont to monitor CBC  Anemia: Chronic, stable Likely related to CKD.  Baseline ~10.  Hgb this AM 10.8 - cont to monitor CBC  Peripheral Neuropathy: Chronic Symptoms unchanged. Last Vitamin B12 12/2016, 332. - cont to monitor  Pterygium OU: Chronic No change in symptoms,  has seen ophtho in the past. - cont artificial tears prn  Tobacco Abuse Current 0.5 ppd smoker. - encourage tobacco cessation - consider nicotine patch prn  FEN/GI: Heart Healthy PPx: on heparin drip  Disposition: pending clinical improvement   Subjective:  Patient denies any chest pain while resting.  Notes that he did not rest well overnight and is very tired.  Objective: Temp:  [97.5 F (36.4 C)-98.6 F (37 C)] 97.9 F (36.6 C) (08/25 0506) Pulse Rate:  [50-75] 69 (08/25 0506) Resp:  [16-23] 22 (08/25 0506) BP: (70-129)/(27-76) 100/62 (08/25 0820) SpO2:  [98 %-100 %] 98 % (08/25 0506) Weight:  [59 kg-65.6 kg] 59 kg (08/25 0506)  Physical Exam: General: 52 y.o.20ale in NAD Cardio: RRR, systolic murmur Lungs: CTAB, no wheezing, no rhonchi, no crackles Abdomen: Soft, non-tender to palpation, positive bowel sounds Skin: warm and dry Extremities: No edema   Laboratory: Recent Labs  Lab 05/17/2018 1436 05/22/18 0642 05/23/18 0248  WBC 6.1 6.3 5.8  HGB 11.0* 10.8* 10.8*  HCT 35.1* 34.0* 34.1*  PLT 78* 75* 70*   Recent Labs  Lab 05/19/2018 1436 05/22/18 0642 05/23/18  0248  NA 140 139 140  K 4.6 5.0 4.2  CL 102 102 100  CO2 25 24 26   BUN 24* 31* 18  CREATININE 8.01* 9.00* 6.48*  CALCIUM 9.9 9.6 9.9  PROT  --  7.7  --   BILITOT  --  2.6*  --   ALKPHOS  --  201*  --   ALT  --  9  --   AST  --  14*  --   GLUCOSE 79 82 88     Imaging/Diagnostic Tests: ECHO ordered Heart cath Monday 8/26   Richarda Osmond, DO 05/23/2018, 7:55 AM PGY-1, [place] Intern pager: [phone], text pages welcome

## 2018-05-24 ENCOUNTER — Encounter (HOSPITAL_COMMUNITY): Admission: EM | Disposition: E | Payer: Self-pay | Source: Home / Self Care | Attending: Family Medicine

## 2018-05-24 DIAGNOSIS — I350 Nonrheumatic aortic (valve) stenosis: Secondary | ICD-10-CM

## 2018-05-24 DIAGNOSIS — I5023 Acute on chronic systolic (congestive) heart failure: Secondary | ICD-10-CM | POA: Diagnosis present

## 2018-05-24 HISTORY — PX: RIGHT/LEFT HEART CATH AND CORONARY ANGIOGRAPHY: CATH118266

## 2018-05-24 LAB — POCT I-STAT 3, VENOUS BLOOD GAS (G3P V)
Acid-base deficit: 3 mmol/L — ABNORMAL HIGH (ref 0.0–2.0)
Acid-base deficit: 4 mmol/L — ABNORMAL HIGH (ref 0.0–2.0)
BICARBONATE: 23.3 mmol/L (ref 20.0–28.0)
Bicarbonate: 21.7 mmol/L (ref 20.0–28.0)
O2 Saturation: 65 %
O2 Saturation: 67 %
PCO2 VEN: 39.2 mmHg — AB (ref 44.0–60.0)
PH VEN: 7.34 (ref 7.250–7.430)
PH VEN: 7.35 (ref 7.250–7.430)
TCO2: 23 mmol/L (ref 22–32)
TCO2: 25 mmol/L (ref 22–32)
pCO2, Ven: 43.2 mmHg — ABNORMAL LOW (ref 44.0–60.0)
pO2, Ven: 36 mmHg (ref 32.0–45.0)
pO2, Ven: 37 mmHg (ref 32.0–45.0)

## 2018-05-24 LAB — BASIC METABOLIC PANEL
Anion gap: 15 (ref 5–15)
BUN: 28 mg/dL — AB (ref 6–20)
CALCIUM: 10 mg/dL (ref 8.9–10.3)
CHLORIDE: 102 mmol/L (ref 98–111)
CO2: 24 mmol/L (ref 22–32)
Creatinine, Ser: 8.33 mg/dL — ABNORMAL HIGH (ref 0.61–1.24)
GFR calc non Af Amer: 7 mL/min — ABNORMAL LOW (ref >60)
GFR, EST AFRICAN AMERICAN: 8 mL/min — AB (ref >60)
Glucose, Bld: 78 mg/dL (ref 70–99)
Potassium: 4.3 mmol/L (ref 3.5–5.1)
SODIUM: 141 mmol/L (ref 135–145)

## 2018-05-24 LAB — POCT I-STAT 3, ART BLOOD GAS (G3+)
Acid-base deficit: 3 mmol/L — ABNORMAL HIGH (ref 0.0–2.0)
Bicarbonate: 20.9 mmol/L (ref 20.0–28.0)
O2 Saturation: 99 %
PO2 ART: 143 mmHg — AB (ref 83.0–108.0)
TCO2: 22 mmol/L (ref 22–32)
pCO2 arterial: 33.5 mmHg (ref 32.0–48.0)
pH, Arterial: 7.403 (ref 7.350–7.450)

## 2018-05-24 LAB — CBC
HCT: 32.5 % — ABNORMAL LOW (ref 39.0–52.0)
Hemoglobin: 10.3 g/dL — ABNORMAL LOW (ref 13.0–17.0)
MCH: 34.2 pg — AB (ref 26.0–34.0)
MCHC: 31.7 g/dL (ref 30.0–36.0)
MCV: 108 fL — AB (ref 78.0–100.0)
PLATELETS: 85 10*3/uL — AB (ref 150–400)
RBC: 3.01 MIL/uL — AB (ref 4.22–5.81)
RDW: 18.2 % — ABNORMAL HIGH (ref 11.5–15.5)
WBC: 5.4 10*3/uL (ref 4.0–10.5)

## 2018-05-24 LAB — HEPARIN LEVEL (UNFRACTIONATED): Heparin Unfractionated: 0.64 IU/mL (ref 0.30–0.70)

## 2018-05-24 LAB — POCT ACTIVATED CLOTTING TIME: ACTIVATED CLOTTING TIME: 136 s

## 2018-05-24 SURGERY — RIGHT/LEFT HEART CATH AND CORONARY ANGIOGRAPHY
Anesthesia: LOCAL

## 2018-05-24 MED ORDER — ACETAMINOPHEN 325 MG PO TABS
650.0000 mg | ORAL_TABLET | ORAL | Status: DC | PRN
  Administered 2018-05-24 – 2018-06-01 (×4): 650 mg via ORAL
  Filled 2018-05-24 (×2): qty 2

## 2018-05-24 MED ORDER — LIDOCAINE HCL (PF) 1 % IJ SOLN
INTRAMUSCULAR | Status: AC
  Filled 2018-05-24: qty 30

## 2018-05-24 MED ORDER — ONDANSETRON HCL 4 MG/2ML IJ SOLN: 4.0000 mg | Freq: Four times a day (QID) | INTRAMUSCULAR | Status: DC | PRN

## 2018-05-24 MED ORDER — MORPHINE SULFATE (PF) 2 MG/ML IV SOLN
INTRAVENOUS | Status: AC
  Filled 2018-05-24: qty 1

## 2018-05-24 MED ORDER — MIDAZOLAM HCL 2 MG/2ML IJ SOLN
INTRAMUSCULAR | Status: DC | PRN
  Administered 2018-05-24: 1 mg via INTRAVENOUS

## 2018-05-24 MED ORDER — LIDOCAINE HCL (PF) 1 % IJ SOLN
INTRAMUSCULAR | Status: DC | PRN
  Administered 2018-05-24: 15 mL via INTRADERMAL

## 2018-05-24 MED ORDER — HEPARIN (PORCINE) IN NACL 1000-0.9 UT/500ML-% IV SOLN
INTRAVENOUS | Status: DC | PRN
  Administered 2018-05-24 (×2): 500 mL

## 2018-05-24 MED ORDER — ASPIRIN 81 MG PO CHEW
81.0000 mg | CHEWABLE_TABLET | Freq: Every day | ORAL | Status: DC
  Administered 2018-05-25 – 2018-06-16 (×21): 81 mg via ORAL
  Filled 2018-05-24 (×21): qty 1

## 2018-05-24 MED ORDER — MORPHINE SULFATE (PF) 2 MG/ML IV SOLN
1.0000 mg | Freq: Once | INTRAVENOUS | Status: AC
  Administered 2018-05-24: 1 mg via INTRAVENOUS

## 2018-05-24 MED ORDER — SODIUM CHLORIDE 0.9% FLUSH
3.0000 mL | INTRAVENOUS | Status: DC | PRN
  Administered 2018-06-12: 3 mL via INTRAVENOUS
  Filled 2018-05-24: qty 3

## 2018-05-24 MED ORDER — OXYCODONE-ACETAMINOPHEN 5-325 MG PO TABS
1.0000 | ORAL_TABLET | Freq: Four times a day (QID) | ORAL | Status: DC | PRN
  Administered 2018-05-25 – 2018-06-04 (×8): 1 via ORAL
  Filled 2018-05-24 (×10): qty 1

## 2018-05-24 MED ORDER — MIDAZOLAM HCL 2 MG/2ML IJ SOLN
INTRAMUSCULAR | Status: AC
  Filled 2018-05-24: qty 2

## 2018-05-24 MED ORDER — IOHEXOL 350 MG/ML SOLN
INTRAVENOUS | Status: DC | PRN
  Administered 2018-05-24: 60 mL via INTRA_ARTERIAL

## 2018-05-24 MED ORDER — ATORVASTATIN CALCIUM 80 MG PO TABS: 80.0000 mg | ORAL_TABLET | Freq: Every day | ORAL | Status: DC

## 2018-05-24 MED ORDER — ENOXAPARIN SODIUM 30 MG/0.3ML ~~LOC~~ SOLN: 30.0000 mg | SUBCUTANEOUS | Status: DC

## 2018-05-24 MED ORDER — TENOFOVIR DISOPROXIL FUMARATE 300 MG PO TABS
300.0000 mg | ORAL_TABLET | ORAL | Status: DC
  Administered 2018-05-30 – 2018-06-13 (×3): 300 mg via ORAL
  Filled 2018-05-24 (×3): qty 1

## 2018-05-24 MED ORDER — SODIUM CHLORIDE 0.9 % IV SOLN
250.0000 mL | INTRAVENOUS | Status: DC | PRN
  Administered 2018-05-28: 13:00:00 via INTRAVENOUS
  Administered 2018-06-10: 10 mL via INTRAVENOUS
  Administered 2018-06-12 – 2018-06-16 (×5): 250 mL via INTRAVENOUS

## 2018-05-24 MED ORDER — DIPHENHYDRAMINE HCL 50 MG/ML IJ SOLN
50.0000 mg | Freq: Once | INTRAMUSCULAR | Status: AC
  Administered 2018-05-24: 50 mg via INTRAVENOUS
  Filled 2018-05-24: qty 1

## 2018-05-24 MED ORDER — HEPARIN (PORCINE) IN NACL 1000-0.9 UT/500ML-% IV SOLN
INTRAVENOUS | Status: AC
  Filled 2018-05-24: qty 1000

## 2018-05-24 MED ORDER — SODIUM CHLORIDE 0.9% FLUSH
3.0000 mL | Freq: Two times a day (BID) | INTRAVENOUS | Status: DC
  Administered 2018-05-24 – 2018-06-16 (×37): 3 mL via INTRAVENOUS

## 2018-05-24 MED ORDER — CHLORHEXIDINE GLUCONATE CLOTH 2 % EX PADS
6.0000 | MEDICATED_PAD | Freq: Every day | CUTANEOUS | Status: DC
  Administered 2018-05-25 – 2018-05-28 (×2): 6 via TOPICAL

## 2018-05-24 MED ORDER — PREDNISONE 20 MG PO TABS
50.0000 mg | ORAL_TABLET | ORAL | Status: AC
  Administered 2018-05-24 (×3): 50 mg via ORAL
  Filled 2018-05-24 (×3): qty 1

## 2018-05-24 SURGICAL SUPPLY — 10 items
CATH INFINITI 5FR MULTPACK ANG (CATHETERS) ×1 IMPLANT
CATH SWAN GANZ 7F STRAIGHT (CATHETERS) ×2 IMPLANT
KIT HEART LEFT (KITS) ×2 IMPLANT
PACK CARDIAC CATHETERIZATION (CUSTOM PROCEDURE TRAY) ×2 IMPLANT
SHEATH PINNACLE 5F 10CM (SHEATH) ×1 IMPLANT
SHEATH PINNACLE 7F 10CM (SHEATH) ×1 IMPLANT
TRANSDUCER W/STOPCOCK (MISCELLANEOUS) ×3 IMPLANT
WIRE EMERALD 3MM-J .025X260CM (WIRE) ×1 IMPLANT
WIRE EMERALD 3MM-J .035X150CM (WIRE) ×2 IMPLANT
WIRE EMERALD ST .035X150CM (WIRE) ×2 IMPLANT

## 2018-05-24 NOTE — Interval H&P Note (Signed)
Cath Lab Visit (complete for each Cath Lab visit)  Clinical Evaluation Leading to the Procedure:   ACS: Yes.    Non-ACS:    Anginal Classification: CCS III  Anti-ischemic medical therapy: Minimal Therapy (1 class of medications)  Non-Invasive Test Results: No non-invasive testing performed  Prior CABG: No previous CABG      History and Physical Interval Note:  11-25-2017 2:54 PM  Pete PeltJeffrey E XXXCunningham  has presented today for surgery, with the diagnosis of NSTEMI  The various methods of treatment have been discussed with the patient and family. After consideration of risks, benefits and other options for treatment, the patient has consented to  Procedure(s): LEFT HEART CATH AND CORONARY ANGIOGRAPHY (N/A) as a surgical intervention .  The patient's history has been reviewed, patient examined, no change in status, stable for surgery.  I have reviewed the patient's chart and labs.  Questions were answered to the patient's satisfaction.     Nicki Guadalajarahomas Mahnoor Mathisen

## 2018-05-24 NOTE — Progress Notes (Signed)
Family Medicine Teaching Service Daily Progress Note Intern Pager: [phone]  Patient name: [name] Medical record number: [id number] Date of birth: Oct 30, 1965 Age: 52 y.o. Gender: male  Primary Care Provider: Patient, No Pcp Per Consultants: Cardiology Code Status: Full  Pt Overview and Major Events to Date:  8/23 Admit to FPTS for NSTEMI with TTE showing EF 20-25%, started on heparin and nitro drip 8/26 Cardiac cath  Assessment and Plan: Caleb Dufault Cunninghamis a 52 y.o.malepresenting with chest pain and feeling unwel found to have NSTEMI. PMH is significant forHIV, ESRD, HSV, anemia, peripheral neuropathy, anemia, GI bleed, PUD, AS.  1.  Inferolateral NSTEMI: Acute.  Stable on heparin drip.  Appears to be inferiolateral ischemia based on EKG.  Troponins elevated and plateaued while on heparin.  BNP >4500 which is consistent with TTE showing EF 20-25%.  Vitals remain stable with HR in the 70s and BP with systolics high 78L and 38B with diastolics in the 01B to 51W.  His right-sided chest pain has significantly improved and is minimal. - Cardiology consulted, appreciate recommendations - Continue heparin drip - Heart catheterization planned 8/26 - Continue aspirin 81 mg daily, Lopressor 12.5 mg twice daily, Crestor 5 mg daily - Telemetry and continuous pulse ox  2.  ESRD on HD: Chronic.  Stable.  Currently receives HD on T/Th/Sa with LUE AVF.  Based on RFP, no need for emergent dialysis. - Nephrology consulted, appreciate recommendations - Next scheduled HD session 8/27  3.  Hypotension: Acute.  Has history of hypertension.  Has remained below EDW.  Is on dialysis.  Initially given nitro drip 8/25, now discontinued.  Likely related to an NSTEMI. - Continue Lopressor 12.5 mg twice daily - Continue management for NSTEMI per problem #1  4.  HIV: Chronic.  Well-controlled.  Last CD4 780, viral load undetectable. - Continue TIVICAY and PREZISTA  5.  Post herpetic  lesion patient is complaining of intense pain along prior shingles scarring and acutely tender to palpation. Ordered renal dose appropriate gabapentin -gabapentin 177m daily -monitor pain  6.  Systolic murmur: Chronic History of aortic stenosis.  Echo pending. - f/u Echo results scheduled 8/25 - cards consulted, appreciate recs  7.  HLD: Chronic Lipid Panel normal. LFTs showed AST 14, ALT 9, Alk phos 201 - cont crestor 580mQD  8.  HSV: Chronic, stable On Acyclovir prn.  No current outbreak. - consider acyclovir prn if symptoms arise during hospitalization  9.  PUD: Stable Hx GI in 2018.  On protonix 4084mID at home.  Hgb baseline ~10.  Hgb pending this AM. Continues to deny melena and BRBPR.  - f/u CBC this AM - cont home protonix - cont to monitor CBC  10.  Anemia: Chronic, stable Likely related to CKD.  Baseline ~10.  Hgb this AM 10.8 - cont to monitor CBC  11.  Peripheral Neuropathy: Chronic Symptoms unchanged. Last Vitamin B12 12/2016, 332. - cont to monitor  12.  Pterygium OU: Chronic No change in symptoms,  has seen ophtho in the past. - cont artificial tears prn  13.  Tobacco Abuse Current 0.5 ppd smoker. - encourage tobacco cessation - consider nicotine patch prn  FEN/GI: Heart Healthy PPx: Heparin drip  Disposition: Pending resolution of an STEMI.  Plan for cath later on 8/26.    Subjective:  Patient reports right-sided chest pain is significantly improved since yesterday and he is without shortness of breath.  He does have some intermittent pain which occasionally radiates to his  right neck.  He is concerned about the bulging jugular on his right neck.  He is otherwise without complaints and would like to know when he is having his cardiac cath today.  Objective: Temp:  [97.3 F (36.3 C)-98 F (36.7 C)] 97.9 F (36.6 C) (08/26 0449) Pulse Rate:  [60-70] 70 (08/26 0842) Resp:  [20] 20 (08/25 2355) BP: (89-130)/(55-103) 89/76 (08/26 0842) SpO2:   [99 %-100 %] 100 % (08/25 2355) Physical Exam: General: calm male sitting upright at bedside, NAD with non-toxic appearance HEENT: normocephalic, atraumatic, moist mucous membranes Neck: supple, non-tender without lymphadenopathy, significant JVD present Cardiovascular: regular rate and rhythm with holosystolic murmur, no rubs, or gallops, LUE AVG +bruit Lungs: clear to auscultation bilaterally with normal work of breathing on room air Skin: warm, dry, no rashes or lesions, cap refill < 2 seconds Extremities: warm and well perfused, normal tone, no edema  Laboratory: Recent Labs  Lab 05/22/18 0642 05/23/18 0248 05/10/2018 0650  WBC 6.3 5.8 5.4  HGB 10.8* 10.8* 10.3*  HCT 34.0* 34.1* 32.5*  PLT 75* 70* 85*   Recent Labs  Lab 05/22/18 0642 05/23/18 0248 05/23/2018 0650  NA 139 140 141  K 5.0 4.2 4.3  CL 102 100 102  CO2 24 26 24   BUN 31* 18 28*  CREATININE 9.00* 6.48* 8.33*  CALCIUM 9.6 9.9 10.0  PROT 7.7  --   --   BILITOT 2.6*  --   --   ALKPHOS 201*  --   --   ALT 9  --   --   AST 14*  --   --   GLUCOSE 82 88 78   Troponin I: 1.19<1.07<1.43<1.35<1.31 B12: 388 (WNL) Folate: 9.0 (WNL) Lipid panel: LDL 87 (WNL) BNP: >4,500 A1c: 4.5 TSH: 1.763 Magnesium: 2.2  Imaging/Diagnostic Tests: EKG-12 LEAD (05/23/2018) NSR, ST and T wave abnormalities suspicious for inferior lateral ischemia (leads II, III, aVF, V4, V5, V6), prolonged QTC 501, left atrial enlargement, rightward axis  TRANSTHORACIC ECHOCARDIOGRAM (05/23/2018) Study Conclusions - Left ventricle: The cavity size was normal. Wall thickness was   increased in a pattern of mild LVH. Systolic function was   severely reduced. The estimated ejection fraction was in the   range of 20% to 25%. Diffuse hypokinesis. Features are consistent   with a pseudonormal left ventricular filling pattern, with   concomitant abnormal relaxation and increased filling pressure   (grade 2 diastolic dysfunction). Doppler parameters  are   consistent with high ventricular filling pressure. - Aortic valve: Moderately to severely calcified annulus. Severely   thickened leaflets. There is low flow low gradient severe aortic   stenosis based on available data. The CW Doppler profile of the   AV is very limited, the true gradient may be higher than   reported. There was moderate regurgitation. Mean gradient (S): 18   mm Hg. VTI ratio of LVOT to aortic valve: 0.25. Valve area (VTI):   0.78 cm^2. Valve area (Vmax): 0.81 cm^2. Valve area (Vmean): 0.75   cm^2. - Mitral valve: Moderately calcified annulus. Mildly thickened   leaflets . There was mild regurgitation. - Left atrium: The atrium was severely dilated. - Right ventricle: The cavity size was mildly dilated. Systolic   function was mildly to moderately reduced. - Right atrium: The atrium was moderately dilated. - Atrial septum: No defect or patent foramen ovale was identified.    [place] Bing, DO 05/12/2018, 8:53 AM PGY-3, [facility] Intern pager: [phone], text pages welcome

## 2018-05-24 NOTE — Progress Notes (Signed)
Site area: Right groin a 5 french arterial and 7 french venous sheath was removed  Site Prior to Removal:  Level 0  Pressure Applied For 15 MINUTES    Bedrest Beginning at 1650pManual:   Yes.    Patient Status During Pull:  stable  Post Pull Groin Site:  Level 0  Post Pull Instructions Given:  Yes.    Post Pull Pulses Present:  Yes.    Dressing Applied:  Yes.    Comments:  VS remain stable

## 2018-05-24 NOTE — H&P (View-Only) (Signed)
 The patient has been seen in conjunction with Vin Bhagat, PAC. All aspects of care have been considered and discussed. The patient has been personally interviewed, examined, and all clinical data has been reviewed.   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  Clinical exam, distended neck veins, suggests significant elevation in pulmonary pressures.  EKG demonstrates severe right axis deviation.  Elevated flat troponins are noted.  Echo with dramatic decrease in EF to 25% from 50% compared to 1.5 years ago.  No LVH is noted.  Reader suggests the possibility of low flow low gradient severe aortic stenosis.  Valve is heavily calcified and compatible with aortic stenosis.  Moderate regurgitation also present.  Overall plan is to perform left and right heart catheterization with coronary angiography.  Cardiac markers are elevated and significant CAD needs to be excluded.  May need dobutamine echo to confirm diagnosis of critical aortic stenosis.  Treatment options may be limited if aortic regurgitation is indeed moderate.  May not be an open surgical candidate for valve replacement/CABG.   Progress Note  Patient Name: [name] Date of Encounter: 05/13/2018  Primary Cardiologist: Mihai Croitoru, MD   Subjective   Breathing stable. No chest pain. However complains of R neck pain.   Inpatient Medications    Scheduled Meds: . [START ON 05/25/2018] aspirin  81 mg Oral Daily  . darunavir  800 mg Oral Q supper  . diphenhydrAMINE  50 mg Intravenous Once  . dolutegravir  50 mg Oral Q supper  . gabapentin  100 mg Oral Daily  . metoprolol tartrate  12.5 mg Oral BID  . pantoprazole  40 mg Oral BID  . predniSONE  50 mg Oral Q4H  .  ritonavir  100 mg Oral Q supper  . rosuvastatin  5 mg Oral q1800  . sodium chloride flush  3 mL Intravenous Q12H   Continuous Infusions: . sodium chloride    . sodium chloride 10 mL/hr at 05/11/2018 0606  . heparin 1,200 Units/hr (05/23/18 1816)  . nitroGLYCERIN Stopped (05/22/18 1237)   PRN Meds: sodium chloride, acetaminophen, ondansetron (ZOFRAN) IV, polyvinyl alcohol, sodium chloride flush   Vital Signs    Vitals:   05/23/18 2036 05/23/18 2355 05/23/2018 0449 05/20/2018 0842  BP: 128/77 (!) 118/103 (!) 91/55 (!) 89/76  Pulse: 61 63 66 70  Resp:  20    Temp: 98 F (36.7 C) (!) 97.5 F (36.4 C) 97.9 F (36.6 C)   TempSrc: Oral Oral Oral   SpO2: 99% 100%    Weight:      Height:        Intake/Output Summary (Last 24 hours) at 05/04/2018 0926 Last data filed at 05/22/2018 0626 Gross per 24 hour  Intake 1110 ml  Output -  Net 1110 ml   Filed Weights   05/22/18 1253 05/22/18 1618 05/23/18 0506  Weight: 65.6 kg 64 kg 59 kg    Telemetry    SR with PVCS - Personally Reviewed  ECG    SR with inferior lateral ST depression  - Personally Reviewed  Physical Exam   GEN: No acute distress.   Neck: No JVD Cardiac: RRR, no murmurs, rubs, or gallops.  Respiratory: Clear to auscultation bilaterally. GI: Soft, nontender, non-distended  MS: No edema; No deformity. Neuro:  Nonfocal  Psych: Normal affect     Labs    Chemistry Recent Labs  Lab 05/22/18 0642 05/23/18 0248 05/01/2018 0650  NA 139 140 141  K 5.0 4.2 4.3  CL 102 100 102  CO2 24 26 24  GLUCOSE 82 88 78  BUN 31* 18 28*  CREATININE 9.00* 6.48* 8.33*  CALCIUM 9.6 9.9 10.0  PROT 7.7  --   --   ALBUMIN 3.1*  --   --   AST 14*  --   --   ALT 9  --   --   ALKPHOS 201*  --   --   BILITOT 2.6*  --   --   GFRNONAA 6* 9* 7*  GFRAA 7* 10* 8*  ANIONGAP 13 14 15     Hematology Recent Labs  Lab 05/22/18 0642 05/23/18 0248 05/22/2018 0650  WBC 6.3 5.8 5.4  RBC 3.16* 3.21* 3.01*  HGB 10.8* 10.8* 10.3*  HCT  34.0* 34.1* 32.5*  MCV 107.6* 106.2* 108.0*  MCH 34.2* 33.6 34.2*  MCHC 31.8 31.7 31.7  RDW 17.8* 17.6* 18.2*  PLT 75* 70* 85*    Cardiac Enzymes Recent Labs  Lab 05/22/18 0043 05/22/18 0642 05/22/18 2036 05/23/18 0248  TROPONINI 1.35* 1.43* 1.07* 1.19*    Recent Labs  Lab 05/29/2018 1455  TROPIPOC 0.86*     BNP Recent Labs  Lab 05/06/2018 1907  BNP >4,500.0*      Radiology    No results found.  Cardiac Studies   Echo 05/23/18 Study Conclusions  - Left ventricle: The cavity size was normal. Wall thickness was   increased in a pattern of mild LVH. Systolic function was   severely reduced. The estimated ejection fraction was in the   range of 20% to 25%. Diffuse hypokinesis. Features are consistent   with a pseudonormal left ventricular filling pattern, with   concomitant abnormal relaxation and increased filling pressure   (grade 2 diastolic dysfunction). Doppler parameters are   consistent with high ventricular filling pressure. - Aortic valve: Moderately to severely calcified annulus. Severely   thickened leaflets. There is low flow low gradient severe aortic   stenosis based on available data. The CW Doppler profile of the   AV is very limited, the true gradient may be higher than   reported. There was moderate regurgitation. Mean gradient (S): 18   mm Hg. VTI ratio of LVOT to aortic valve: 0.25. Valve area (VTI):   0.78 cm^2. Valve area (Vmax): 0.81 cm^2. Valve area (Vmean): 0.75   cm^2. - Mitral valve: Moderately calcified annulus. Mildly thickened   leaflets . There was mild regurgitation. - Left atrium: The atrium was severely dilated. - Right ventricle: The cavity size was mildly dilated. Systolic   function was mildly to moderately reduced. - Right atrium: The atrium was moderately dilated. - Atrial septum: No defect or patent foramen ovale was identified.  Patient Profile     [name] Sossamonis a 52 y.o.malewith history of end-stage renal  disease on hemodialysis Tuesday Thursday and Saturday, HIV, anemia, peripheral neuropathy, peptic ulcer disease and aortic stenosis who presented for evaluation of chest pain and shortness of breath.  Found to have a non-STEMI.  Now with critical AS  Pending left and right cath today.  Assessment & Plan    1.  Non-STEMI -Peak of troponin 1.43.  Now trending down.  On IV heparin.  Currently denies chest pain.  EKG with inferior lateral ST depression. Stopped IV nitro due to hypotension>> will avoid given critical AS.  -Echocardiogram showed   newly reduced LV function of 20 to 25% with severe aortic stenosis. -We will do left and right heart cardiac cath today.  Needs groin access due to fistula on arms.  -Continue aspirin, statin and beta-blocker.  05/22/2018: Cholesterol 145; HDL 48; LDL Cholesterol 87; Triglycerides 51; VLDL 10 - If CAD, increase statin.   The patient understands that risks include but are not limited to stroke (1 in 1000), death (1 in 1000), kidney failure [usually temporary] (1 in 500), bleeding (1 in 200), allergic reaction [possibly serious] (1 in 200), and agrees to proceed.   2.  Acute combined congestive heart failure -Cardiogram showed LV function of 20 to 25%, diffuse hypokinesis and grade 2 diastolic dysfunction. -He has severe aortic stenosis with moderate regurgitation. -Volume  managed by dialysis. -Continue metoprolol (may consider up titration pending results).  No ACE or ARD due to CKD.  3.  Severe aortic stenosis with moderate regurgitation -Echo showed worsening aortic stenosis with moderate to severely calcified annulus. -He has critical aortic stenosis likely contributing to his cardiomyopathy.  Patient has noted worsening dyspnea on exertion and exertional limitation for past 6 months. -Left and right cath today.  He will benefit from TAVR.  4.  End-stage renal disease -On hemodialysis.  He will need dialysis shortly after his cardiac cath.   For  questions or updates, please contact [facility] Please consult [email or web address] for contact info under Cardiology/STEMI.      Signed, Bhavinkumar Bhagat, PA  05/02/2018, 9:26 AM   

## 2018-05-24 NOTE — Progress Notes (Signed)
[facility] Progress Note   Subjective:  Seen in room - sitting in recliner Anxiously awaiting LHC today.    Objective Vitals:   05/23/18 2036 05/23/18 2355 05/27/2018 0449 05/12/2018 0842  BP: 128/77 (!) 118/103 (!) 91/55 (!) 89/76  Pulse: 61 63 66 70  Resp:  20    Temp: 98 F (36.7 C) (!) 97.5 F (36.4 C) 97.9 F (36.6 C)   TempSrc: Oral Oral Oral   SpO2: 99% 100%    Weight:      Height:       Physical Exam General: Thin male NAD  Heart: RRR 2/6 systolic murmur  Lungs: CTAB  Abdomen: soft NT +BS Extremities: No LE edema  Dialysis Access: LUE AVG +bruit   Dialysis Orders:  [facility] 3.5h 400/800 EDW 66kg 2K/2.5Ca Profile 4 Na Linear L AVG No heparin  -Hectorol 3 mcg IV TIW -Venofer 50mg  IV q weeks  -Mircera 100mcg IV q 2 weeks last 8/20  -Ca acetate 4 tid qac   Assessment/Plan: 1. NSTEMI - w/u per cards/primary team - LHC today.  2. ESRD -  HD TTS. Next HD 8/27 - orders written 3. Hypertension/volume  - BP low/stable. On metoprolol. Net UF 1.6L with HD Sat. Post wt 64kg. He reports weight loss and is now below EDW. Feels better with volume off - Plan to lower EDW at discharge.  Tentatively 1.5L UF tomorrow.  4. Anemia  - Hgb 10.3 - stable on ESA as outpatient  5. Metabolic bone disease -  Corr Ca elevated. Use 2Ca bath.  Follow trends.  May need non-Ca based binder.  6. Nutrition - renal diet/vitamins/prostat for low albumin  7. Aortic stenosis - per cards/Echo pending 8. Thrombocytopenia - appears chronic, PTA   9. HIV - cont outpatient meds   Estill BakesLindsay Yesenia Fontenette MD [facility] pager [phone]   05/11/2018, 10:47 AM    Additional Objective Labs: Basic Metabolic Panel: Recent Labs  Lab 05/22/18 0642 05/23/18 0248 05/23/2018 0650  NA 139 140 141  K 5.0 4.2 4.3  CL 102 100 102  CO2 24 26 24   GLUCOSE 82 88 78  BUN 31* 18 28*  CREATININE 9.00* 6.48* 8.33*  CALCIUM 9.6 9.9 10.0   CBC: Recent Labs  Lab 04/29/2018 1436  05/22/18 0642 05/23/18 0248 05/04/2018 0650  WBC 6.1 6.3 5.8 5.4  HGB 11.0* 10.8* 10.8* 10.3*  HCT 35.1* 34.0* 34.1* 32.5*  MCV 109.0* 107.6* 106.2* 108.0*  PLT 78* 75* 70* 85*   Blood Culture    Component Value Date/Time   SDES SPUTUM 10/15/2015 0028   SDES SPUTUM 10/15/2015 0028   SPECREQUEST NONE 10/15/2015 0028   SPECREQUEST NONE 10/15/2015 0028   CULT  10/15/2015 0028    MODERATE STAPHYLOCOCCUS AUREUS Note: RIFAMPIN AND GENTAMICIN SHOULD NOT BE USED AS SINGLE DRUGS FOR TREATMENT OF STAPH INFECTIONS. This organism DOES NOT demonstrate inducible Clindamycin resistance in vitro. [facility]formed at [facility]    REPTSTATUS 10/15/2015 FINAL 10/15/2015 0028   REPTSTATUS 10/18/2015 FINAL 10/15/2015 0028    Cardiac Enzymes: Recent Labs  Lab 05/11/2018 1907 05/22/18 0043 05/22/18 0642 05/22/18 2036 05/23/18 0248  TROPONINI 1.31* 1.35* 1.43* 1.07* 1.19*   CBG: No results for input(s): GLUCAP in the last 168 hours. Iron Studies: No results for input(s): IRON, TIBC, TRANSFERRIN, FERRITIN in the last 72 hours. Lab Results  Component Value Date   INR 1.25 01/02/2017   INR 1.07 12/07/2016   INR 0.98 08/04/2015   Medications: .  sodium chloride    . sodium chloride 10 mL/hr at 05/19/2018 0606  . heparin 1,200 Units/hr (05/23/18 1816)  . nitroGLYCERIN Stopped (05/22/18 1237)   . [START ON 05/25/2018] aspirin  81 mg Oral Daily  . darunavir  800 mg Oral Q supper  . diphenhydrAMINE  50 mg Intravenous Once  . dolutegravir  50 mg Oral Q supper  . gabapentin  100 mg Oral Daily  . metoprolol tartrate  12.5 mg Oral BID  . pantoprazole  40 mg Oral BID  . predniSONE  50 mg Oral Q4H  . ritonavir  100 mg Oral Q supper  . rosuvastatin  5 mg Oral q1800  . sodium chloride flush  3 mL Intravenous Q12H

## 2018-05-24 NOTE — Progress Notes (Signed)
ANTICOAGULATION CONSULT NOTE  Pharmacy Consult for heparin Indication: chest pain/ACS  Allergies  Allergen Reactions  . Iodinated Diagnostic Agents Hives, Itching and Rash    "skin rash - severe" per outside records  . Penicillins Hives and Other (See Comments)    Rx required Hospitalization Has patient had a PCN reaction causing immediate rash, facial/tongue/throat swelling, SOB or lightheadedness with hypotension: Yes Has patient had a PCN reaction causing severe rash involving mucus membranes or skin necrosis: No Has patient had a PCN reaction that required hospitalization Yes Has patient had a PCN reaction occurring within the last 10 years: No   . Shellfish-Derived Products Nausea Only    Irritates the stomach  . Acetaminophen-Codeine Nausea And Vomiting  . Tape Rash and Other (See Comments)    Use paper tape only (NO CLEAR PLASTIC TAPE!!)   Patient Measurements: Height: 5\' 11"  (180.3 cm) Weight: 130 lb 1.6 oz (59 kg) IBW/kg (Calculated) : 75.3 Heparin Dosing Weight: 66.7 Kg  Vital Signs: Temp: 97.9 F (36.6 C) (08/26 0449) Temp Source: Oral (08/26 0449) BP: 91/55 (08/26 0449) Pulse Rate: 66 (08/26 0449)  Labs: Recent Labs    05/22/18 [id number]  05/22/18 2036 05/23/18 0248 05/23/18 0806 05/23/18 1654 05/17/2018 0650  HGB 10.8*  --   --  10.8*  --   --  10.3*  HCT 34.0*  --   --  34.1*  --   --  32.5*  PLT 75*  --   --  70*  --   --  85*  HEPARINUNFRC  --    < > 0.37  --  1.03* 0.27* 0.64  CREATININE 9.00*  --   --  6.48*  --   --  8.33*  TROPONINI 1.43*  --  1.07* 1.19*  --   --   --    < > = values in this interval not displayed.   Estimated Creatinine Clearance: 8.9 mL/min (A) (by C-G formula based on SCr of 8.33 mg/dL (H)).  Assessment:  89 yoM with PMH ESRD presenting with SOB. Pharmacy consulted to dose IV heparin for NSTEMI (no boluses per MD). No AC PTA. Planned diagnostic cardiac catheterization today.    Heparin level therapeutic at 0.64 after previous  increased to 1200 units / hour. Spoke with Nurse: confirmed no signs or symptoms of bleeding. Hgb 10.3, pltc 85.       Goal of Therapy:  Heparin level 0.3-0.7 units/ml Monitor platelets by anticoagulation protocol: Yes   Plan:  Continue heparin infusion at 1200 units/hr  Daily heparin level and CBC Monitor s/sx of bleeding  Thank you for allowing pharmacy to be a part of this patient's care.  Bradley FerrisJoshua Tessin, PharmD PGY1 Pharmacy Resident Direct Phone: [phone] 05/07/2018 8:32 AM

## 2018-05-24 NOTE — Progress Notes (Addendum)
The patient has been seen in conjunction with Vin Bhagat, PAC. All aspects of care have been considered and discussed. The patient has been personally interviewed, examined, and all clinical data has been reviewed.   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  Clinical exam, distended neck veins, suggests significant elevation in pulmonary pressures.  EKG demonstrates severe right axis deviation.  Elevated flat troponins are noted.  Echo with dramatic decrease in EF to 25% from 50% compared to 1.5 years ago.  No LVH is noted.  Reader suggests the possibility of low flow low gradient severe aortic stenosis.  Valve is heavily calcified and compatible with aortic stenosis.  Moderate regurgitation also present.  Overall plan is to perform left and right heart catheterization with coronary angiography.  Cardiac markers are elevated and significant CAD needs to be excluded.  May need dobutamine echo to confirm diagnosis of critical aortic stenosis.  Treatment options may be limited if aortic regurgitation is indeed moderate.  May not be an open surgical candidate for valve replacement/CABG.   Progress Note  Patient Name: [name] Date of Encounter: 06-18-2018  Primary Cardiologist: Thurmon Fair, MD   Subjective   Breathing stable. No chest pain. However complains of R neck pain.   Inpatient Medications    Scheduled Meds: . [START ON 05/25/2018] aspirin  81 mg Oral Daily  . darunavir  800 mg Oral Q supper  . diphenhydrAMINE  50 mg Intravenous Once  . dolutegravir  50 mg Oral Q supper  . gabapentin  100 mg Oral Daily  . metoprolol tartrate  12.5 mg Oral BID  . pantoprazole  40 mg Oral BID  . predniSONE  50 mg Oral Q4H  .  ritonavir  100 mg Oral Q supper  . rosuvastatin  5 mg Oral q1800  . sodium chloride flush  3 mL Intravenous Q12H   Continuous Infusions: . sodium chloride    . sodium chloride 10 mL/hr at June 18, 2018 0606  . heparin 1,200 Units/hr (05/23/18 1816)  . nitroGLYCERIN Stopped (05/22/18 1237)   PRN Meds: sodium chloride, acetaminophen, ondansetron (ZOFRAN) IV, polyvinyl alcohol, sodium chloride flush   Vital Signs    Vitals:   05/23/18 2036 05/23/18 2355 06-18-18 0449 06-18-2018 0842  BP: 128/77 (!) 118/103 (!) 91/55 (!) 89/76  Pulse: 61 63 66 70  Resp:  20    Temp: 98 F (36.7 C) (!) 97.5 F (36.4 C) 97.9 F (36.6 C)   TempSrc: Oral Oral Oral   SpO2: 99% 100%    Weight:      Height:        Intake/Output Summary (Last 24 hours) at June 18, 2018 0926 Last data filed at 06/18/2018 1610 Gross per 24 hour  Intake 1110 ml  Output -  Net 1110 ml   Filed Weights   05/22/18 1253 05/22/18 1618 05/23/18 0506  Weight: 65.6 kg 64 kg 59 kg    Telemetry    SR with PVCS - Personally Reviewed  ECG    SR with inferior lateral ST depression  - Personally Reviewed  Physical Exam   GEN: No acute distress.   Neck: No JVD Cardiac: RRR, no murmurs, rubs, or gallops.  Respiratory: Clear to auscultation bilaterally. GI: Soft, nontender, non-distended  MS: No edema; No deformity. Neuro:  Nonfocal  Psych: Normal affect  Labs    Chemistry Recent Labs  Lab 05/22/18 [phone] 05/23/18 0248 04/30/2018 0650  NA 139 140 141  K 5.0 4.2 4.3  CL 102 100 102  CO2 24 26 24   GLUCOSE 82 88 78  BUN 31* 18 28*  CREATININE 9.00* 6.48* 8.33*  CALCIUM 9.6 9.9 10.0  PROT 7.7  --   --   ALBUMIN 3.1*  --   --   AST 14*  --   --   ALT 9  --   --   ALKPHOS 201*  --   --   BILITOT 2.6*  --   --   GFRNONAA 6* 9* 7*  GFRAA 7* 10* 8*  ANIONGAP 13 14 15      Hematology Recent Labs  Lab 05/22/18 [id number] 05/23/18 0248 05/29/2018 0650  WBC 6.3 5.8 5.4  RBC 3.16* 3.21* 3.01*  HGB 10.8* 10.8* 10.3*  HCT  34.0* 34.1* 32.5*  MCV 107.6* 106.2* 108.0*  MCH 34.2* 33.6 34.2*  MCHC 31.8 31.7 31.7  RDW 17.8* 17.6* 18.2*  PLT 75* 70* 85*    Cardiac Enzymes Recent Labs  Lab 05/22/18 0043 05/22/18 0642 05/22/18 2036 05/23/18 0248  TROPONINI 1.35* 1.43* 1.07* 1.19*    Recent Labs  Lab 05/13/2018 1455  TROPIPOC 0.86*     BNP Recent Labs  Lab 05/10/2018 1907  BNP >4,500.0*      Radiology    No results found.  Cardiac Studies   Echo 05/23/18 Study Conclusions  - Left ventricle: The cavity size was normal. Wall thickness was   increased in a pattern of mild LVH. Systolic function was   severely reduced. The estimated ejection fraction was in the   range of 20% to 25%. Diffuse hypokinesis. Features are consistent   with a pseudonormal left ventricular filling pattern, with   concomitant abnormal relaxation and increased filling pressure   (grade 2 diastolic dysfunction). Doppler parameters are   consistent with high ventricular filling pressure. - Aortic valve: Moderately to severely calcified annulus. Severely   thickened leaflets. There is low flow low gradient severe aortic   stenosis based on available data. The CW Doppler profile of the   AV is very limited, the true gradient may be higher than   reported. There was moderate regurgitation. Mean gradient (S): 18   mm Hg. VTI ratio of LVOT to aortic valve: 0.25. Valve area (VTI):   0.78 cm^2. Valve area (Vmax): 0.81 cm^2. Valve area (Vmean): 0.75   cm^2. - Mitral valve: Moderately calcified annulus. Mildly thickened   leaflets . There was mild regurgitation. - Left atrium: The atrium was severely dilated. - Right ventricle: The cavity size was mildly dilated. Systolic   function was mildly to moderately reduced. - Right atrium: The atrium was moderately dilated. - Atrial septum: No defect or patent foramen ovale was identified.  Patient Profile     Caleb CharonJeffrey E Cunninghamis a 52 y.o.malewith history of end-stage renal  disease on hemodialysis Tuesday Thursday and Saturday, HIV, anemia, peripheral neuropathy, peptic ulcer disease and aortic stenosis who presented for evaluation of chest pain and shortness of breath.  Found to have a non-STEMI.  Now with critical AS  Pending left and right cath today.  Assessment & Plan    1.  Non-STEMI -Peak of troponin 1.43.  Now trending down.  On IV heparin.  Currently denies chest pain.  EKG with inferior lateral ST depression. Stopped IV nitro due to hypotension>> will avoid given critical AS.  -Echocardiogram showed  newly reduced LV function of 20 to 25% with severe aortic stenosis. -We will do left and right heart cardiac cath today.  Needs groin access due to fistula on arms.  -Continue aspirin, statin and beta-blocker.  05/22/2018: Cholesterol 145; HDL 48; LDL Cholesterol 87; Triglycerides 51; VLDL 10 - If CAD, increase statin.   The patient understands that risks include but are not limited to stroke (1 in 1000), death (1 in 1000), kidney failure [usually temporary] (1 in 500), bleeding (1 in 200), allergic reaction [possibly serious] (1 in 200), and agrees to proceed.   2.  Acute combined congestive heart failure -Cardiogram showed LV function of 20 to 25%, diffuse hypokinesis and grade 2 diastolic dysfunction. -He has severe aortic stenosis with moderate regurgitation. -Volume  managed by dialysis. -Continue metoprolol (may consider up titration pending results).  No ACE or ARD due to CKD.  3.  Severe aortic stenosis with moderate regurgitation -Echo showed worsening aortic stenosis with moderate to severely calcified annulus. -He has critical aortic stenosis likely contributing to his cardiomyopathy.  Patient has noted worsening dyspnea on exertion and exertional limitation for past 6 months. -Left and right cath today.  He will benefit from TAVR.  4.  End-stage renal disease -On hemodialysis.  He will need dialysis shortly after his cardiac cath.   For  questions or updates, please contact [facility] Please consult [email or web address] for contact info under Cardiology/STEMI.      Lorelei Pont, PA  05/01/2018, 9:26 AM

## 2018-05-24 NOTE — Progress Notes (Signed)
Chest pain 8/10 noted. BP 95/18. Vin Bhagat updated. New order for morphine 1mg  IV  Given. Cardiology to come and re evaluate pt per PA-C.

## 2018-05-25 ENCOUNTER — Encounter (HOSPITAL_COMMUNITY): Payer: Self-pay | Admitting: Cardiovascular Disease

## 2018-05-25 DIAGNOSIS — Z992 Dependence on renal dialysis: Secondary | ICD-10-CM

## 2018-05-25 DIAGNOSIS — I2510 Atherosclerotic heart disease of native coronary artery without angina pectoris: Secondary | ICD-10-CM

## 2018-05-25 DIAGNOSIS — N186 End stage renal disease: Secondary | ICD-10-CM

## 2018-05-25 LAB — RENAL FUNCTION PANEL
ALBUMIN: 3 g/dL — AB (ref 3.5–5.0)
Anion gap: 20 — ABNORMAL HIGH (ref 5–15)
BUN: 46 mg/dL — AB (ref 6–20)
CO2: 19 mmol/L — ABNORMAL LOW (ref 22–32)
CREATININE: 9.74 mg/dL — AB (ref 0.61–1.24)
Calcium: 10 mg/dL (ref 8.9–10.3)
Chloride: 97 mmol/L — ABNORMAL LOW (ref 98–111)
GFR calc Af Amer: 6 mL/min — ABNORMAL LOW (ref >60)
GFR calc non Af Amer: 5 mL/min — ABNORMAL LOW (ref >60)
Glucose, Bld: 126 mg/dL — ABNORMAL HIGH (ref 70–99)
PHOSPHORUS: 9 mg/dL — AB (ref 2.5–4.6)
POTASSIUM: 5.4 mmol/L — AB (ref 3.5–5.1)
Sodium: 136 mmol/L (ref 135–145)

## 2018-05-25 MED ORDER — ALTEPLASE 2 MG IJ SOLR: 2.0000 mg | Freq: Once | INTRAMUSCULAR | Status: DC | PRN

## 2018-05-25 MED ORDER — LIDOCAINE HCL (PF) 1 % IJ SOLN: 5.0000 mL | INTRAMUSCULAR | Status: DC | PRN

## 2018-05-25 MED ORDER — SEVELAMER CARBONATE 800 MG PO TABS
1600.0000 mg | ORAL_TABLET | Freq: Three times a day (TID) | ORAL | Status: DC
  Administered 2018-05-25 – 2018-06-09 (×30): 1600 mg via ORAL
  Filled 2018-05-25 (×32): qty 2

## 2018-05-25 MED ORDER — PENTAFLUOROPROP-TETRAFLUOROETH EX AERO: 1.0000 "application " | INHALATION_SPRAY | CUTANEOUS | Status: DC | PRN

## 2018-05-25 MED ORDER — SODIUM CHLORIDE 0.9 % IV SOLN: 100.0000 mL | INTRAVENOUS | Status: DC | PRN

## 2018-05-25 MED ORDER — HEPARIN SODIUM (PORCINE) 1000 UNIT/ML DIALYSIS: 1000.0000 [IU] | INTRAMUSCULAR | Status: DC | PRN

## 2018-05-25 MED ORDER — ENOXAPARIN SODIUM 30 MG/0.3ML ~~LOC~~ SOLN
30.0000 mg | SUBCUTANEOUS | Status: DC
  Administered 2018-05-25 – 2018-05-31 (×7): 30 mg via SUBCUTANEOUS
  Filled 2018-05-25 (×8): qty 0.3

## 2018-05-25 MED ORDER — LIDOCAINE-PRILOCAINE 2.5-2.5 % EX CREA: 1.0000 "application " | TOPICAL_CREAM | CUTANEOUS | Status: DC | PRN

## 2018-05-25 NOTE — Progress Notes (Signed)
[facility] Progress Note   Subjective:  Seen on HD - feels better after 1L UF.   LHC with significant CAD, new HFrEF.  Cardiology formulating plan - stents vs CABG.   Objective Vitals:   05/25/18 0730 05/25/18 0800 05/25/18 0830 05/25/18 0900  BP: (!) 106/91 (!) 91/59 (P) 91/71 (P) 104/66  Pulse: 84 71  (P) 75  Resp: (!) 25 (!) 25 (P) 19 (P) 17  Temp:      TempSrc:      SpO2:      Weight:      Height:       Physical Exam General: Thin male NAD  Heart: RRR 2/6 systolic murmur  Lungs: CTAB  Abdomen: soft NT +BS Extremities: trace LE edema  Dialysis Access: LUE AVG +bruit   Dialysis Orders:  [facility] 3.5h 400/800 EDW 66kg 2K/2.5Ca Profile 4 Na Linear L AVG No heparin  -Hectorol 3 mcg IV TIW -Venofer 50mg  IV q weeks  -Mircera IV q 2 weeks last 8/20  -Ca acetate 4 tid qac   Assessment/Plan: 1. NSTEMI - w/u per cards/primary team - sounds like intervention will be required while hospitalized.  2. ESRD -  HD TTS. HD today.  EDW [id number], prewt today 65.3kg -- will need new EDW (cont to challenge). 3. Hypertension/volume  - BP low/stable. On metoprolol. Net UF 1 today. Will challenge at next treatment.   4. Anemia  - Hgb 10.3 8/26 - stable on ESA as outpatient; monitor while hospitalized  5. Metabolic bone disease -  Corr Ca elevated. Using 2Ca bath. Phos 9.0.  He was taking phoslo outpatient but in light of hypercalcemia d/w him and start renvela 1600 TID with meals.  6. Nutrition - renal diet/vitamins/prostat for low albumin  7. Aortic stenosis - per cards/Echo pending 8. Thrombocytopenia - appears chronic, PTA   9. HIV - cont outpatient meds   Estill Bakes MD [facility] pager [phone]   05/25/2018, 10:12 AM    Additional Objective Labs: Basic Metabolic Panel: Recent Labs  Lab 05/23/18 0248 05/29/2018 0650 05/25/18 0500  NA 140 141 136  K 4.2 4.3 5.4*  CL 100 102 97*  CO2 26 24 19*  GLUCOSE 88 78 126*  BUN 18 28*  46*  CREATININE 6.48* 8.33* 9.74*  CALCIUM 9.9 10.0 10.0  PHOS  --   --  9.0*   CBC: Recent Labs  Lab 05/20/2018 1436 05/22/18 0642 05/23/18 0248 05/08/2018 0650  WBC 6.1 6.3 5.8 5.4  HGB 11.0* 10.8* 10.8* 10.3*  HCT 35.1* 34.0* 34.1* 32.5*  MCV 109.0* 107.6* 106.2* 108.0*  PLT 78* 75* 70* 85*   Blood Culture    Component Value Date/Time   SDES SPUTUM 10/15/2015 0028   SDES SPUTUM 10/15/2015 0028   SPECREQUEST NONE 10/15/2015 0028   SPECREQUEST NONE 10/15/2015 0028   CULT  10/15/2015 0028    MODERATE STAPHYLOCOCCUS AUREUS Note: RIFAMPIN AND GENTAMICIN SHOULD NOT BE USED AS SINGLE DRUGS FOR TREATMENT OF STAPH INFECTIONS. This organism DOES NOT demonstrate inducible Clindamycin resistance in vitro. Performed at [facility]    REPTSTATUS 10/15/2015 FINAL 10/15/2015 0028   REPTSTATUS 10/18/2015 FINAL 10/15/2015 0028    Cardiac Enzymes: Recent Labs  Lab 05/23/2018 1907 05/22/18 0043 05/22/18 0642 05/22/18 2036 05/23/18 0248  TROPONINI 1.31* 1.35* 1.43* 1.07* 1.19*   CBG: No results for input(s): GLUCAP in the last 168 hours. Iron Studies: No results for input(s): IRON, TIBC, TRANSFERRIN, FERRITIN in the last 72  hours. Lab Results  Component Value Date   INR 1.25 01/02/2017   INR 1.07 12/07/2016   INR 0.98 08/04/2015   Medications: . sodium chloride    . sodium chloride    . sodium chloride    . nitroGLYCERIN Stopped (05/22/18 1237)   . aspirin  81 mg Oral Daily  . Chlorhexidine Gluconate Cloth  6 each Topical Q0600  . darunavir  800 mg Oral Q supper  . dolutegravir  50 mg Oral Q supper  . enoxaparin (LOVENOX) injection  30 mg Subcutaneous Q24H  . gabapentin  100 mg Oral Daily  . metoprolol tartrate  12.5 mg Oral BID  . pantoprazole  40 mg Oral BID  . ritonavir  100 mg Oral Q supper  . rosuvastatin  5 mg Oral q1800  . sodium chloride flush  3 mL Intravenous Q12H  . [START ON 05/30/2018] tenofovir  300 mg Oral Weekly

## 2018-05-25 NOTE — Progress Notes (Signed)
HCPOA document presented to the patient, he will complete and notify staff when ready to have it notarized.  He also inquired of having his wife's HCPOA notarized as well, Chaplain will follow up on both. Chaplain Janell QuietAudrey Maxson Oddo [phone]

## 2018-05-25 NOTE — Consult Note (Addendum)
[facility]   MULTIDISCIPLINARY HEART VALVE TEAM  Inpatient TAVR Consultation:   Patient ID: [name]; [id number]; 1965/10/27   Admit date: 05/18/2018 Date of Consult: 05/25/2018  Primary Care Provider: Patient, No Pcp Per Primary Cardiologist: Dr. Royann Shiversroitoru   Patient Profile:   [name] is a 52 y.o. male with a hx of ESRD on HD (T,T,S), HIV, syphilis, herpes, HTN, anemia, thrombocytopenia, protein calorie malnutrition, probable COPD w/ ongoing tobacco abuse, CAD, chronic systolic CHF, pulmonary HTN, and moderate to severe aortic stenosis with who is being seen today for the evaluation of possible low flow, low gradient severe aortic stenosis with moderate AI and CAD requiring revascularization at the request of Dr. Katrinka BlazingSmith.   History of Present Illness:   [name] lives in [place] with his wife. He has three stepchildren that are grown. 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 Otherwise he spends his days at HD and doing "honey dos" for his wife, driving around doing errands. He helps with the grocery shopping and could walk around the store with a cart with no problems until recently.   He has a history of ESRD on HD since 2000. He has HIV that controlled on 4 drug regimen followed by Dr. Orvan Falconerampbell. He used to drink heavily but now only has an occasional beer. He continues to smoke cigarettes. He has his native bottom teeth. He says he gets semi-regular dental cleanings through the infectious disease clinic and is unaware of any current dental issues.    He has no prior cardiac history. Echo from 09/2015 showed EF 45-50% with mild AI. Repeat echo 12/2016 showed EF 55-60%, possible bicuspid Aov with moderately thickened, calcified leaflets, fusion of L/R coronary commissure, mean gradient 13 mmHg with mild AI.    He was in his usual state of health until 05/17/18 when he noticed increased bloating with abdominal  and chest discomfort. He attributed this to volume overload and went to HD as scheduled. He had return of symptoms as well as worsening chest pain and dyspnea on exertion prompting evaluation in the emergency department on 05/10/2018. ECG showed LVH with lateral TWIs. First troponin was normal but follow up troponin returned abnormal at 0.86. He was started on IV heparin and admitted for further work up. Troponin remained elevated and flat with peak at 1.43. BNP >4500, Hg 10.3, PLT 85.   Repeat echocardiogram 05/23/18 showed new, severe LV dysfunction with EF 20-25%, G2DD, low flow, low gradient severe AS with mod AI ( mean gradient 18 mm Hg, DVI 0.25, AVA 0.78), mild MR, biatrial enlargement, and mild-mod RV dysfunction.   R/LHC on 05/11/2018 showed severe calcific CAD with 100% occl D1, 95% occl prox-mid LAD, 99% occl D2, 90% occl first marg, 20% occl prox-mid RCA with mid distal 95% occl in an anterior RV marginal branch (not able to be shown on the diagram). He had significant elevation right heart pressures (LVEDP 38 mmHg) with moderately severe pulmonary hypertension as well as low gradient at least moderate aortic valve stenosis with a mean gradient of 18 mm Hg, peak to peak gradient of 14 mm Hg, and peak instantaneous gradient of 28 mm; aortic valve area of 1.2 to 1.3 cm. He has been getting hemodialysis here for volume management.   The multidisciplinary valve team is consulted to evaluate treatment option: rotational atherectomy of LAD + TAVR vs CABG + SAVR.  [name] is feeling better today. No chest pain  currently. He admits to about 2 months of worsening fatigue, weakness, and dyspnea. He just felt like he was slowing down. The week prior to admission he started experiencing exertional chest pain and shortness of breath. He was unable to walk to the car without significant dyspnea. He denies dizziness or syncope. He admits to orthopnea and LE edema.      Past Medical History:  Diagnosis  Date  . Anal condyloma   . Anemia   . Aortic stenosis    a. 12/2016 Echo: EF 55-60%, no rwma, possibly bicuspid AoV, mod thickened, mod Ca2+. Fusion of R-L coronary commissure. Cusp separatino mod reduced. Mild AI. Peak velocity (S) 245 cm/s. Mean gradient (S) .   . Diarrhea    occurs because of dialysis  . DJD (degenerative joint disease)    right knee.  All joints  . ESRD (end stage renal disease) on dialysis (HCC) 01/02/2012   Gets diaysis at [facility] on [employer], TTS schedule.  Started dialysis in 2000.     . Family history of disseminated HSV infection   . Genital herpes   . Hemodialysis patient [facility])    Tuesday, Thursday, Saturday  . History of blood transfusion   . HIV infection (HCC)   . Hypertension   . Pneumonia 1/20107   hx of  . Shortness of breath    "can happen at anytime" (03/04/2013).  03/14/16- "when I have too much fluid"  . Tertiary syphilis   . 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 Reclotted on 03/02/13 and IR placed L IJ tunneled HD catheter (a right external jugular tunneled HD cath was attempted first but did not function due to kinking across the clavicle). Pt was seen by VVS on same admission 6/614 and said RUA AVF no longer usable, they ordered vein mapping and will see him as outpatient to set up next permanent access.       Past Surgical History:  Procedure Laterality Date  . A/V SHUNTOGRAM Left 04/15/2017   Procedure: A/V Shuntogram;  Surgeon: Maeola Harman, MD;  Location: [facility] INVASIVE CV LAB;  Service: Cardiovascular;  Laterality: Left;  . APPENDECTOMY    . AV FISTULA PLACEMENT Right 06/16/11  . AV FISTULA PLACEMENT Left 03/17/2016   Procedure: INSERTION OF ARTERIOVENOUS (AV) GORE-TEX GRAFT ARM USING GORETEX 4-7MM X 45CM STRETCH GRAFT;  Surgeon: Sherren Kerns, MD;  Location: [facility] OR;  Service: Vascular;  Laterality: Left;  . AV FISTULA PLACEMENT, BRACHIOCEPHALIC Right  06/16/11  . ESOPHAGOGASTRODUODENOSCOPY N/A 12/09/2016   Procedure: ESOPHAGOGASTRODUODENOSCOPY (EGD);  Surgeon: Kathi Der, MD;  Location: [facility] ENDOSCOPY;  Service: Gastroenterology;  Laterality: N/A;  . ESOPHAGOGASTRODUODENOSCOPY N/A 01/05/2017   Procedure: ESOPHAGOGASTRODUODENOSCOPY (EGD);  Surgeon: Vida Rigger, MD;  Location: [facility] ENDOSCOPY;  Service: Endoscopy;  Laterality: N/A;  . ESOPHAGOGASTRODUODENOSCOPY (EGD) WITH PROPOFOL N/A 01/06/2017   Procedure: ESOPHAGOGASTRODUODENOSCOPY (EGD) WITH PROPOFOL;  Surgeon: Kathi Der, MD;  Location: [facility] ENDOSCOPY;  Service: Gastroenterology;  Laterality: N/A;  . INCISION AND DRAINAGE ABSCESS     abdominal  . PERIPHERAL VASCULAR CATHETERIZATION N/A 03/07/2016   Procedure: Venogram;  Surgeon: Sherren Kerns, MD;  Location: [facility] INVASIVE CV LAB;  Service: Cardiovascular;  Laterality: N/A;  . PERIPHERAL VASCULAR INTERVENTION Left 04/15/2017   Procedure: Peripheral Vascular Intervention;  Surgeon: Maeola Harman, MD;  Location: [facility] INVASIVE CV LAB;  Service: Cardiovascular;  Laterality: Left;  . REVISON OF ARTERIOVENOUS FISTULA Right 04/20/2013  Procedure: INSERTION OF ARTERIOVENOUS GORTEX GRAFT;  Surgeon: Sherren Kerns, MD;  Location: [facility] OR;  Service: Vascular;  Laterality: Right;  Ultrasound Guided  . RIGHT/LEFT HEART CATH AND CORONARY ANGIOGRAPHY N/A 05/06/2018   Procedure: RIGHT/LEFT HEART CATH AND CORONARY ANGIOGRAPHY;  Surgeon: Lennette Bihari, MD;  Location: [facility] INVASIVE CV LAB;  Service: Cardiovascular;  Laterality: N/A;  . TONSILLECTOMY       Inpatient Medications: Scheduled Meds: . aspirin  81 mg Oral Daily  . Chlorhexidine Gluconate Cloth  6 each Topical Q0600  . darunavir  800 mg Oral Q supper  . dolutegravir  50 mg Oral Q supper  . enoxaparin (LOVENOX) injection  30 mg Subcutaneous Q24H  . gabapentin  100 mg Oral Daily  . metoprolol tartrate  12.5 mg Oral BID  . pantoprazole  40 mg Oral BID  . ritonavir  100 mg Oral Q supper    . rosuvastatin  5 mg Oral q1800  . sevelamer carbonate  1,600 mg Oral TID WC  . sodium chloride flush  3 mL Intravenous Q12H  . [START ON 05/30/2018] tenofovir  300 mg Oral Weekly   Continuous Infusions: . sodium chloride    . nitroGLYCERIN Stopped (05/22/18 1237)   PRN Meds: sodium chloride, acetaminophen, ondansetron (ZOFRAN) IV, ondansetron (ZOFRAN) IV, oxyCODONE-acetaminophen, polyvinyl alcohol, sodium chloride flush  Allergies:    Allergies  Allergen Reactions  . Iodinated Diagnostic Agents Hives, Itching and Rash    "skin rash - severe" per outside records  . Penicillins Hives and Other (See Comments)    Rx required Hospitalization Has patient had a PCN reaction causing immediate rash, facial/tongue/throat swelling, SOB or lightheadedness with hypotension: Yes Has patient had a PCN reaction causing severe rash involving mucus membranes or skin necrosis: No Has patient had a PCN reaction that required hospitalization Yes Has patient had a PCN reaction occurring within the last 10 years: No   . Shellfish-Derived Products Nausea Only    Irritates the stomach  . Acetaminophen-Codeine Nausea And Vomiting  . Tape Rash and Other (See Comments)    Use paper tape only (NO CLEAR PLASTIC TAPE!!)    Social History:   Social History   Socioeconomic History  . Marital status: Married    Spouse name: Not on file  . Number of children: Not on file  . Years of education: Not on file  . Highest education level: Not on file  Occupational History  . Not on file  Social Needs  . Financial resource strain: Not on file  . Food insecurity:    Worry: Not on file    Inability: Not on file  . Transportation needs:    Medical: Not on file    Non-medical: Not on file  Tobacco Use  . Smoking status: Heavy Tobacco Smoker    Packs/day: 0.50    Years: 27.00    Pack years: 13.50    Types: Cigarettes  . Smokeless tobacco: Never Used  . Tobacco comment: currently smoking ~ 1/3 ppd.   Substance and Sexual Activity  . Alcohol use: No    Alcohol/week: 0.0 standard drinks    Comment: 03/04/2013 "stopped drinking ~ 12 yr ago; never had problem w/it".    . Drug use: No  . Sexual activity: Not Currently    Partners: Female    Birth control/protection: Condom    Comment: declined condoms  Lifestyle  . Physical activity:    Days per week: Not on file    Minutes per session:  Not on file  . Stress: Not on file  Relationships  . Social connections:    Talks on phone: Not on file    Gets together: Not on file    Attends religious service: Not on file    Active member of club or organization: Not on file    Attends meetings of clubs or organizations: Not on file    Relationship status: Not on file  . Intimate partner violence:    Fear of current or ex partner: Not on file    Emotionally abused: Not on file    Physically abused: Not on file    Forced sexual activity: Not on file  Other Topics Concern  . Not on file  Social History Narrative   Lives in [place] with wife.    Family History:   The patient's family history includes Arthritis in his father; Diabetes in his mother.    Review of Systems  Constitution: Positive for malaise/fatigue. Negative for chills and fever.  HENT: Negative.   Cardiovascular: Positive for chest pain, leg swelling and orthopnea. Negative for claudication, palpitations and paroxysmal nocturnal dyspnea.  Skin: Negative for itching and rash.  Musculoskeletal: Negative.   Gastrointestinal: Positive for heartburn. Negative for abdominal pain and diarrhea.  Genitourinary: Negative.      Physical Exam/Data:   Vitals:   05/25/18 1000 05/25/18 1030 05/25/18 1044 05/25/18 1357  BP: (!) 88/64 (!) 90/59 97/67 (!) 83/63  Pulse: 73 69 65 74  Resp: 20 (!) 24 (!) 23   Temp:   (!) 97.4 F (36.3 C) 97.8 F (36.6 C)  TempSrc:   Oral Oral  SpO2:   100% 97%  Weight:   64.7 kg   Height:        Intake/Output Summary (Last 24 hours) at 05/25/2018  1554 Last data filed at 05/25/2018 1044 Gross per 24 hour  Intake 240 ml  Output 1000 ml  Net -760 ml   Filed Weights   05/25/18 0535 05/25/18 0655 05/25/18 1044  Weight: 65.3 kg 65.3 kg 64.7 kg   Body mass index is 19.89 kg/m.  General: malnourished, frail appearing AA male HEENT: normal Lymph: no adenopathy Neck: no JVD Endocrine:  No thryomegaly Vascular: No carotid bruits; FA pulses 2+ bilaterally without bruits  Cardiac:  normal S1, S2; RRR; 3/6 harsh murmur Lungs:  clear to auscultation bilaterally, no wheezing, rhonchi or rales  Abd: soft, nontender, no hepatomegaly  Ext: 1+ bilateral LE edema Musculoskeletal:  No deformities, BUE and BLE strength normal and equal Skin: warm and dry  Neuro:  CNs 2-12 intact, no focal abnormalities noted Psych:  Normal affect   EKG:  The EKG was personally reviewed and demonstrates:  Sinus with diffuse TWIs Telemetry:  Telemetry was personally reviewed and demonstrates: sinus   Relevant CV Studies:  2D ECHO 05/23/18 Study Conclusions - Left ventricle: The cavity size was normal. Wall thickness was   increased in a pattern of mild LVH. Systolic function was   severely reduced. The estimated ejection fraction was in the   range of 20% to 25%. Diffuse hypokinesis. Features are consistent   with a pseudonormal left ventricular filling pattern, with   concomitant abnormal relaxation and increased filling pressure   (grade 2 diastolic dysfunction). Doppler parameters are   consistent with high ventricular filling pressure. - Aortic valve: Moderately to severely calcified annulus. Severely   thickened leaflets. There is low flow low gradient severe aortic   stenosis based on available data. The CW  Doppler profile of the   AV is very limited, the true gradient may be higher than   reported. There was moderate regurgitation. Mean gradient (S): 18   mm Hg. VTI ratio of LVOT to aortic valve: 0.25. Valve area (VTI):   0.78 cm^2. Valve area  (Vmax): 0.81 cm^2. Valve area (Vmean): 0.75   cm^2. - Mitral valve: Moderately calcified annulus. Mildly thickened   leaflets . There was mild regurgitation. - Left atrium: The atrium was severely dilated. - Right ventricle: The cavity size was mildly dilated. Systolic   function was mildly to moderately reduced. - Right atrium: The atrium was moderately dilated. - Atrial septum: No defect or patent foramen ovale was identified  ________________   05/16/2018 RIGHT/LEFT HEART CATH AND CORONARY ANGIOGRAPHY  Conclusion    1st Diag lesion is 100% stenosed.  Prox LAD to Mid LAD lesion is 95% stenosed.  2nd Diag lesion is 99% stenosed.  1st Mrg lesion is 90% stenosed.  Prox RCA to Mid RCA lesion is 20% stenosed.  Mid RCA lesion is 20% stenosed.   Evidence for coronary calcification with total occlusion of an inferior branch of the first diagonal vessel, 95% LAD stenosis after the septal perforating artery and at the takeoff of a diffusely diseased second diagonal branch in a large LAD vessel which wraps around the apex; a ramus intermediate vessel which bifurcates into 2 branches which has 30% narrowing in the inferior branch and 90% narrowing in the mid superior branch; normal AV groove circumflex vessel; dominant RCA with 20% proximal and 20% mid stenoses with mid distal 95% stenosis in an anterior RV marginal branch (not able to be shown on the diagram).  Significant elevation right heart pressures with moderately severe pulmonary hypertension.  Low gradient at least moderate aortic valve stenosis with a mean gradient of 18 mm Hg, peak to peak gradient of 14 mm Hg, and peak instantaneous gradient of 28 mm.  Aortic valve area of 1.2 to 1.3 cm.  Previous echo Doppler ejection fraction 20 to 25% not assessed during this catheterization due to the markedly elevated LVEDP.  RECOMMENDATION: Patient will be undergoing dialysis with his volume overload.  Will review angiogram with  colleagues.  Options include possible PCI to the LAD versus surgical assessment of candidacy of AVR/CABG.  Recommend Aspirin 81mg  daily for moderate CAD.  If surgical option is negative institute dual antiplatelet therapy with plan for LAD PCI.    Laboratory Data:  Chemistry Recent Labs  Lab 05/23/18 0248 05/10/2018 0650 05/25/18 0500  NA 140 141 136  K 4.2 4.3 5.4*  CL 100 102 97*  CO2 26 24 19*  GLUCOSE 88 78 126*  BUN 18 28* 46*  CREATININE 6.48* 8.33* 9.74*  CALCIUM 9.9 10.0 10.0  GFRNONAA 9* 7* 5*  GFRAA 10* 8* 6*  ANIONGAP 14 15 20*    Recent Labs  Lab 05/22/18 0642 05/25/18 0500  PROT 7.7  --   ALBUMIN 3.1* 3.0*  AST 14*  --   ALT 9  --   ALKPHOS 201*  --   BILITOT 2.6*  --    Hematology Recent Labs  Lab 05/22/18 0642 05/23/18 0248 05/05/2018 0650  WBC 6.3 5.8 5.4  RBC 3.16* 3.21* 3.01*  HGB 10.8* 10.8* 10.3*  HCT 34.0* 34.1* 32.5*  MCV 107.6* 106.2* 108.0*  MCH 34.2* 33.6 34.2*  MCHC 31.8 31.7 31.7  RDW 17.8* 17.6* 18.2*  PLT 75* 70* 85*   Cardiac Enzymes Recent Labs  Lab  05/17/2018 1907 05/22/18 0043 05/22/18 0642 05/22/18 2036 05/23/18 0248  TROPONINI 1.31* 1.35* 1.43* 1.07* 1.19*    Recent Labs  Lab 05/02/2018 1455  TROPIPOC 0.86*    BNP Recent Labs  Lab 05/04/2018 1907  BNP >4,500.0*    DDimer No results for input(s): DDIMER in the last 168 hours.  Radiology/Studies:  Dg Chest 2 View  Result Date: 05/17/2018 CLINICAL DATA:  Initial evaluation for acute shortness of breath since dialysis. EXAM: CHEST - 2 VIEW COMPARISON:  Prior radiograph from 10/20/2017. FINDINGS: Moderate cardiomegaly. Mediastinal silhouette within normal limits. Aortic atherosclerosis. Lungs mildly hypoinflated. Diffuse vascular congestion with asymmetric right greater than left lung opacities, which could reflect asymmetric edema or possibly infiltrates. Small right pleural effusion. No pneumothorax. No acute osseous abnormality. Multiple vascular stents overlie the  chest and bilateral arms. IMPRESSION: 1. Cardiomegaly with perihilar vascular congestion and right greater than left pulmonary opacities. Asymmetric edema is favored, although infiltrates could be considered in the correct clinical setting. 2. Small right pleural effusion. 3. Aortic atherosclerosis. Electronically Signed   By: Rise Mu M.D.   On: 05/09/2018 15:58     STS Risk Calculator: Procedure: AV Replacement + CAB Risk of Mortality: 5.988%   Renal Failure: NA  Permanent Stroke: 1.131%  Prolonged Ventilation: 23.064%  DSW Infection: 0.161%  Reoperation: 12.214%  Morbidity or Mortality: 37.340%  Short Length of Stay: 16.594%  Long Length of Stay: 13.553%   Assessment and Plan:   CAILLOU MINUS is a 52 y.o. male with symptoms of severe, stage D2 aortic stenosis with NYHA Class III symptoms. I have reviewed the patient's recent echocardiogram which is notable for reduced LV systolic function (EF 20-25%) and severe aortic stenosis with peak gradient of 28 mmHg and mean transvalvular gradient of 18 mm Hg. The patient's dimensionless index is 0.25 and calculated aortic valve area is 0.75 cm.   R/LHC on 05/13/2018 showed severe calcific CAD with 100% occl D1, 95% occl prox-mid LAD, 99% occl D2, 90% occl first marg, 20% occl prox-mid RCA with mid distal 95% occl in an anterior RV marginal branch (not able to be shown on the diagram). He had significant elevation right heart pressures (LVEDP 38 mmHg) with moderately severe pulmonary hypertension as well as low gradient at least moderate aortic valve stenosis with a mean gradient of 18 mm Hg, peak to peak gradient of 14 mm Hg, and peak instantaneous gradient of 28 mm; aortic valve area of 1.2 to 1.3 cm.  The patient will require rotational atherectomy of LAD followed by possible TAVR vs CABG + SAVR.   I have reviewed the natural history of aortic stenosis and severe obstructive coronary artery disease with the patient. We have  discussed the limitations of medical therapy and the poor prognosis associated with symptomatic aortic stenosis. We have reviewed potential treatment options, including palliative medical therapy, conventional surgical aortic valve replacement, and transcatheter aortic valve replacement. We discussed treatment options in the context of this patient's specific comorbid medical conditions.   The patient's predicted risk of mortality with conventional aortic valve replacement is 5.988% primarily based on severe LV dysfunction, acute CHF, CAD with NSTEMI, COPD with ongoing tobacco abuse, ESRD, HTN, anemia, thrombocytopenia, and protein calorie malnutrition. Other significant comorbid conditions include HIV, pulmonary hypertension, physical deconditioning and frailty.   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 He has had a dramatic decline over the past 1-2 months with worsening fatigue, weakness, shortness of breath and finally chest pain prompting him to seek medical attention at the [facility] ED. During admission here he was found to have new severe LV dysfunction with acute volume overload and NSTEMI. L/RHC revealed severe obstructive CAD including a 99% LAD lesion that will require revascularization. His cardiomyopathy is felt to be secondary to a combination of CAD and valvular heart disease.   He is only 52 years old but has several comorbid conditions that would make surgery less favorable in this patient. I think he would have a very tough time recovering from surgery given his protein calorie malnutrition, frailty, physical deconditioning, ESRD, LV dysfunction and probable underlying COPD. Per discussion with Dr. Cornelius Moras, we may need a TEE to further characterize.   Formal note by Dr. Cornelius Moras to follow.    Signed, Cline Crock, PA-C  05/25/2018 3:54 PM   I have seen and  examined the patient and agree with the assessment as outlined above by Cline Crock, PA-C  Patient is a 52 year old African-American male with complicated past medical history including end-stage renal disease for which the patient has been dialysis dependent for nearly 20 years.  The patient has hypertension, pulmonary hypertension, anemia, HIV, syphilis, herpes, and likely severe protein calorie depleted malnutrition.  Despite this the patient has still been trying to work part-time locally at the [employer] in order for he and his wife to make ends meet.  They have been intermittently homeless recently.  The patient was admitted to the hospital with an acute exacerbation of chronic combined systolic and diastolic congestive heart failure.  Troponin levels were weakly positive consistent with non-ST segment elevation myocardial infarction.  I have personally reviewed the patient's transthoracic echocardiogram and diagnostic cardiac catheterization.  Echocardiogram demonstrates severe global left ventricular systolic dysfunction with ejection fraction estimated only 20 to 25%.  There were no definite regional wall motion abnormalities.  The aortic valve is probably trileaflet.  There is thickening, sclerosis, and some calcification of all 3 leaflets.  There is at least moderate aortic insufficiency and moderate aortic stenosis.  Peak velocity across the aortic valve was measured 2.6 m/s corresponding to mean transvalvular gradient 18 mmHg.  The DVI was reported 0.25.  The aortic pressure half-time 279 ms.  Catheterization confirmed the presence of at least moderate aortic valve stenosis with mean transvalvular gradient measured 18 mmHg at catheterization.  The aortic valve area was calculated 1.2 to 1.3 cm.  Cardiac output measured 4.5 L/min by thermodilution, 5.1 L/min using Fick.  There was moderate to severe pulmonary hypertension with PA pressures measured 65/30.  Mean pulmonary capillary  wedge pressure was 36, left ventricular end-diastolic pressure 38.  Coronary angiography revealed severe multivessel coronary artery disease.  There is high-grade critical 95% stenosis of the proximal to mid left anterior descending coronary artery.  There is 99% stenosis of a first diagonal branch.  There is nonobstructive disease in the right coronary artery with exception of 95% stenosis in the RV marginal branch.  I agree the patient would benefit from aortic valve replacement and coronary artery bypass grafting.  However, risks associated with conventional surgery would be extremely high because of the patient's numerous comorbid medical conditions.  Even without significant complications the patient's postoperative convalescence would likely be very slow.  It might be reasonable to consider a less invasive treatment strategy including PCI and stenting of the left anterior descending coronary  artery with transcatheter aortic valve replacement.  However, further diagnostic testing will be necessary to evaluate whether or not this would be feasible.  I have discussed matters at length with the patient and his wife in his hospital room this evening.  The patient is hopeful that he will be considered a candidate for some type of definitive intervention.  At the next step I think the patient should undergo transesophageal echocardiogram to further evaluate the severity of aortic insufficiency and the functional anatomy of the aortic valve disease.  CT angiography will need to be performed to evaluate the feasibility of transcatheter aortic valve replacement as an alternative to conventional surgery.  We will continue to follow.   I spent in excess of 60 minutes during the conduct of this hospital encounter and >50% of this time involved direct face-to-face encounter with the patient for counseling and/or coordination of their care.     Purcell Nails, MD 05/26/2018 6:21 PM

## 2018-05-25 NOTE — Care Management Note (Signed)
Case Management Note  Patient Details  Name: [name] MRN: [id number] Date of Birth: Jan 23, 1966  Subjective/Objective: Pt presented for NStemi- post cath- recommendations: PCI to the LAD vs Surgery to consult for AVR/CABG. PTA from home with wife. Pt has hx of HD.                       Action/Plan: CM will continue to monitor for disposition needs.   Expected Discharge Date:                  Expected Discharge Plan:  Home w Home Health Services  In-House Referral:  NA  Discharge planning Services  CM Consult  Post Acute Care Choice:    Choice offered to:     DME Arranged:    DME Agency:     HH Arranged:    HH Agency:     Status of Service:  In process, will continue to follow  If discussed at Long Length of Stay Meetings, dates discussed:    Additional Comments:  [name], [name], RN 05/25/2018, 4:16 PM

## 2018-05-25 NOTE — Progress Notes (Signed)
Progress Note  Patient Name: [name] Date of Encounter: 05/25/2018  Primary Cardiologist: Caleb FairMihai Croitoru, MD   Subjective   Currently on dialysis.  Presented with abdominal bloating and chest discomfort.  Cardiac markers were noted to be elevated.  Underwent coronary angiography which demonstrated significant calcific.  Echocardiogram performed suggests severe low flow low gradient aortic stenosis.  Cardiac catheterization demonstrated a mean gradient of 18 mmHg.  Severe pulmonary hypertension was noted with PA systolic greater than 60 mm.[name]. Tolerating dialysis without difficulty today.  Inpatient Medications    Scheduled Meds: . aspirin  81 mg Oral Daily  . Chlorhexidine Gluconate Cloth  6 each Topical Q0600  . darunavir  800 mg Oral Q supper  . dolutegravir  50 mg Oral Q supper  . enoxaparin (LOVENOX) injection  30 mg Subcutaneous Q24H  . gabapentin  100 mg Oral Daily  . metoprolol tartrate  12.5 mg Oral BID  . pantoprazole  40 mg Oral BID  . ritonavir  100 mg Oral Q supper  . rosuvastatin  5 mg Oral q1800  . sodium chloride flush  3 mL Intravenous Q12H  . [START ON 05/30/2018] tenofovir  300 mg Oral Weekly   Continuous Infusions: . sodium chloride    . sodium chloride    . sodium chloride    . nitroGLYCERIN Stopped (05/22/18 1237)   PRN Meds: sodium chloride, sodium chloride, sodium chloride, acetaminophen, alteplase, heparin, lidocaine (PF), lidocaine-prilocaine, ondansetron (ZOFRAN) IV, ondansetron (ZOFRAN) IV, oxyCODONE-acetaminophen, pentafluoroprop-tetrafluoroeth, polyvinyl alcohol, sodium chloride flush   Vital Signs    Vitals:   05/25/18 0730 05/25/18 0800 05/25/18 0830 05/25/18 0900  BP: (!) 106/91 (!) 91/59 (P) 91/71 (P) 104/66  Pulse: 84 71  (P) 75  Resp: (!) 25 (!) 25 (P) 19 (P) 17  Temp:      TempSrc:      SpO2:      Weight:      Height:        Intake/Output Summary (Last 24 hours) at 05/25/2018 0947 Last data filed at 05/25/2018  0538 Gross per 24 hour  Intake 240 ml  Output 0 ml  Net 240 ml   Filed Weights   05/23/18 0506 05/25/18 0535 05/25/18 0655  Weight: 59 kg 65.3 kg 65.3 kg    Telemetry    Sinus rhythm - Personally Reviewed  ECG    A new tracing is not performed today.- Personally Reviewed  Physical Exam  Somewhat nourished appearing. GEN: No acute distress.   Neck: No JVD Cardiac: RRR, rubs, or gallops.  3/6 crescendo decrescendo systolic. Respiratory: Clear to auscultation bilaterally. GI: Soft, nontender, non-distended  MS: No edema; No deformity. Neuro:  Nonfocal  Psych: Normal affect   Labs    Chemistry Recent Labs  Lab 05/22/18 [id number] 05/23/18 0248 05/23/2018 0650 05/25/18 0500  NA 139 140 141 136  K 5.0 4.2 4.3 5.4*  CL 102 100 102 97*  CO2 24 26 24  19*  GLUCOSE 82 88 78 126*  BUN 31* 18 28* 46*  CREATININE 9.00* 6.48* 8.33* 9.74*  CALCIUM 9.6 9.9 10.0 10.0  PROT 7.7  --   --   --   ALBUMIN 3.1*  --   --  3.0*  AST 14*  --   --   --   ALT 9  --   --   --   ALKPHOS 201*  --   --   --   BILITOT 2.6*  --   --   --  GFRNONAA 6* 9* 7* 5*  GFRAA 7* 10* 8* 6*  ANIONGAP 13 14 15  20*     Hematology Recent Labs  Lab 05/22/18 0642 05/23/18 0248 05/08/2018 0650  WBC 6.3 5.8 5.4  RBC 3.16* 3.21* 3.01*  HGB 10.8* 10.8* 10.3*  HCT 34.0* 34.1* 32.5*  MCV 107.6* 106.2* 108.0*  MCH 34.2* 33.6 34.2*  MCHC 31.8 31.7 31.7  RDW 17.8* 17.6* 18.2*  PLT 75* 70* 85*    Cardiac Enzymes Recent Labs  Lab 05/22/18 0043 05/22/18 0642 05/22/18 2036 05/23/18 0248  TROPONINI 1.35* 1.43* 1.07* 1.19*    Recent Labs  Lab 05/25/2018 1455  TROPIPOC 0.86*     BNP Recent Labs  Lab 24-May-2018 1907  BNP >4,500.0*     DDimer No results for input(s): DDIMER in the last 168 hours.   Radiology    No results found.  Cardiac Studies   Left and right heart cath with coronary angiography 05/29/2018:  Evidence for coronary calcification with total occlusion of an inferior branch of  the first diagonal vessel, 95% LAD stenosis after the septal perforating artery and at the takeoff of a diffusely diseased second diagonal branch in a large LAD vessel which wraps around the apex; a ramus intermediate vessel which bifurcates into 2 branches which has 30% narrowing in the inferior branch and 90% narrowing in the mid superior branch; normal AV groove circumflex vessel; dominant RCA with 20% proximal and 20% mid stenoses with mid distal 95% stenosis in an anterior RV marginal branch (not able to be shown on the diagram).  Significant elevation right heart pressures with moderately severe pulmonary hypertension.  Low gradient at least moderate aortic valve stenosis with a mean gradient of 18 mm Hg, peak to peak gradient of 14 mm Hg, and peak instantaneous gradient of 28 mm.  Aortic valve area of 1.2 to 1.3 cm.  Previous echo Doppler ejection fraction 20 to 25% not assessed during this catheterization due to the markedly elevated LVEDP.  RECOMMENDATION: Patient will be undergoing dialysis with his volume overload.  Will review angiogram with colleagues.  Options include possible PCI to the LAD versus surgical assessment of candidacy of AVR/CABG.  Echocardiogram 05/23/2018:  Personally reviewed.  There is both aortic stenosis and regurgitation (both appear to be at least moderate).  LV is mildly dilated with global hypokinesis more severe in the distal septum and apical region.  EF in 25 to 30% range.  Study Conclusions  - Left ventricle: The cavity size was normal. Wall thickness was   increased in a pattern of mild LVH. Systolic function was   severely reduced. The estimated ejection fraction was in the   range of 20% to 25%. Diffuse hypokinesis. Features are consistent   with a pseudonormal left ventricular filling pattern, with   concomitant abnormal relaxation and increased filling pressure   (grade 2 diastolic dysfunction). Doppler parameters are   consistent with high  ventricular filling pressure. - Aortic valve: Moderately to severely calcified annulus. Severely   thickened leaflets. There is low flow low gradient severe aortic   stenosis based on available data. The CW Doppler profile of the   AV is very limited, the true gradient may be higher than   reported. There was moderate regurgitation. Mean gradient (S): 18   mm Hg. VTI ratio of LVOT to aortic valve: 0.25. Valve area (VTI):   0.78 cm^2. Valve area (Vmax): 0.81 cm^2. Valve area (Vmean): 0.75   cm^2. - Mitral valve: Moderately calcified annulus.  Mildly thickened   leaflets . There was mild regurgitation. - Left atrium: The atrium was severely dilated. - Right ventricle: The cavity size was mildly dilated. Systolic   function was mildly to moderately reduced. - Right atrium: The atrium was moderately dilated. - Atrial septum: No defect or patent foramen ovale was identified.  Patient Profile     52 y.o. 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  Severe pulmonary hypertension also noted.  Assessment & Plan    1. New systolic heart failure secondary CAD and possible contribution from aortic valve disease. 2. Coronary atherosclerosis with severe calcified proximal to mid LAD and also branch vessel disease 3. Severe calcified aortic valve with stenosis and regurgitation (both at least moderate in severity). 4. End-stage RENAL disease on dialysis. 5. Moderate pulmonary hypertension - although catheterization done 48 hours after most recent dialysis.   Difficult clinical situation.  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  He has had a sudden decrease in LV function likely on the basis of both CAD and progression and aortic valve disease.  Need to decide if the patient is a candidate for further valve therapy: TAVR (aortic regurgitation may disqualify patient) versus SAVR.  Could potentially be a hybrid candidate if TAVR is possible.  Could have rotational atherectomy of LAD followed by percutaneous valve surgery at some point. Hopefully, some therapy of valve will be possible but LV and co-morbidities may exclude.      For questions or updates, please contact [facility] Please consult [email or web address] for contact info under Cardiology/STEMI.      Signed, Lesleigh Noe, MD  05/25/2018, 9:47 AM

## 2018-05-25 NOTE — Progress Notes (Signed)
Family Medicine Teaching Service Daily Progress Note Intern Pager: [phone]  Patient name: [name] Medical record number: [id number] Date of birth: March 21, 1966 Age: 52 y.o. Gender: male  Primary Care Provider: Patient, No Pcp Per Consultants: Cardiology Code Status: Full  Pt Overview and Major Events to Date:  8/23 Admit to FPTS for NSTEMI with TTE showing EF 20-25%, started on heparin and nitro drip 8/26 Cardiac cath  Assessment and Plan: [name] a 52 y.o.malepresenting with chest pain and feeling unwell found to have NSTEMI. PMH is significant forHIV, ESRD, HSV, anemia, peripheral neuropathy, anemia, GI bleed, PUD, AS.  1.  Inferolateral NSTEMI:  Acute.  S/p heparin drip which was discontinued 8/26 and R/L heart cath which showed moderate CAD. Recommend aspirin 81 mg daily for this. If surgical option is negative institute dual antiplatelet therapy with plan for LAD PCI.  He will go for HD today for volume overload - cardiology to review angiogram and decide LAD PCI vs surgical assessment of candidacy of AVR/CABG.   Chest pain has significantly improved on exam today.   - f/u cards recs  - Continue aspirin 81 mg daily, Lopressor 12.5 mg twice daily, Crestor 5 mg daily - Telemetry and continuous pulse ox    2.  ESRD on HD:  Chronic.  Stable. Currently receives HD on T/Th/Sa with LUE AVF.   - plan for HD today  - Nephrology following, appreciate recs   3.  Hypotension:  BP this AM 91/59.  Has history of hypertension.  Has remained below EDW.  Is on dialysis.  Initially given nitro drip 8/25, now discontinued.  Likely related to an NSTEMI.  - Continue Lopressor 12.5 mg BID  4.  HIV: Chronic.  Well-controlled.  Last CD4 780, viral load undetectable. - Continue tivicay and prezista  5.  Post herpetic lesion patient is complaining of intense pain along prior shingles scarring and acutely tender to palpation. Ordered renal dose appropriate  gabapentin -gabapentin 16m daily -monitor pain  6.  Systolic murmur: Chronic History of aortic stenosis.  Echo 8/25 with EF 20-25%, diffuse hypokinesis.  - cards consulted, appreciate recs   7.  HLD: Chronic Lipid Panel normal. LFTs showed AST 14, ALT 9, Alk phos 201 - cont crestor 553mQD   8.  HSV: Chronic, stable On Acyclovir prn.  No current outbreak. - consider acyclovir prn if symptoms arise during hospitalization   9.  PUD: Stable Hx GI in 2018.  On protonix 4069mID at home. Continues to deny melena and BRBPR.  - cont home protonix - cont to monitor CBC  10.  Anemia: Chronic, stable Likely related to CKD.  Baseline ~10.  Hgb this AM 10.3.  - cont to monitor CBC  11.  Peripheral Neuropathy: Chronic Symptoms unchanged. Last Vitamin B12 12/2016, 332. 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 - cont artificial tears prn  13.  Tobacco Abuse Current 0.5 ppd smoker. - encourage tobacco cessation - consider nicotine patch prn  FEN/GI: renal/fluid restricted PPx: Heparin   Disposition: Dispo pending clinical improvement    Subjective:  Patient in HD currently, states he feels better since dialysis started.  Denies CP, SOB, leg swelling.   Objective: Temp:  [97.4 F (36.3 C)-97.7 F (36.5 C)] 97.4 F (36.3 C) (08/27 1044) Pulse Rate:  [55-134] 65 (08/27 1044) Resp:  [7-25] 23 (08/27 1044) BP: (88-136)/(23-91) 97/67 (08/27 1044) SpO2:  [  0 %-100 %] 100 % (08/27 1044) Weight:  [64.7 kg-65.3 kg] 64.7 kg (08/27 1044)   Physical Exam: General: 52 yo male, currently in dialysis  HEENT: normocephalic, atraumatic, MMM, o/p clear  Neck: supple, no LAD  Cardiovascular: RRR,  no rubs, or gallops  Lungs: CTAB, on RA  Skin: warm, dry, no rashes or lesions  Extremities: warm and well perfused, normal tone, no edema  Laboratory: Recent Labs  Lab 05/22/18 0642 05/23/18 0248 05/15/2018 0650  WBC 6.3  5.8 5.4  HGB 10.8* 10.8* 10.3*  HCT 34.0* 34.1* 32.5*  PLT 75* 70* 85*   Recent Labs  Lab 05/22/18 0642 05/23/18 0248 05/27/2018 0650 05/25/18 0500  NA 139 140 141 136  K 5.0 4.2 4.3 5.4*  CL 102 100 102 97*  CO2 _0 19*  BUN 31* 18 28* 46*  CREATININE 9.00* 6.48* 8.33* 9.74*  CALCIUM 9.6 9.9 10.0 10.0  PROT 7.7  --   --   --   BILITOT 2.6*  --   --   --   ALKPHOS 201*  --   --   --   ALT 9  --   --   --   AST 14*  --   --   --   GLUCOSE 82 88 78 126*   Troponin I: 1.19<1.07<1.43<1.35<1.31 B12: 388 (WNL) Folate: 9.0 (WNL) Lipid panel: LDL 87 (WNL) BNP: >4,500 A1c: 4.5 TSH: 1.763 Magnesium: 2.2  Imaging/Diagnostic Tests: EKG-12 LEAD (05/23/2018) NSR, ST and T wave abnormalities suspicious for inferior lateral ischemia (leads II, III, aVF, V4, V5, V6), prolonged QTC 501, left atrial enlargement, rightward axis  TRANSTHORACIC ECHOCARDIOGRAM (05/23/2018) Study Conclusions - Left ventricle: The cavity size was normal. Wall thickness was   increased in a pattern of mild LVH. Systolic function was   severely reduced. The estimated ejection fraction was in the   range of 20% to 25%. Diffuse hypokinesis. Features are consistent   with a pseudonormal left ventricular filling pattern, with   concomitant abnormal relaxation and increased filling pressure   (grade 2 diastolic dysfunction). Doppler parameters are   consistent with high ventricular filling pressure. - Aortic valve: Moderately to severely calcified annulus. Severely   thickened leaflets. There is low flow low gradient severe aortic   stenosis based on available data. The CW Doppler profile of the   AV is very limited, the true gradient may be higher than   reported. There was moderate regurgitation. Mean gradient (S): 18   mm Hg. VTI ratio of LVOT to aortic valve: 0.25. Valve area (VTI):   0.78 cm^2. Valve area (Vmax): 0.81 cm^2. Valve area (Vmean): 0.75   cm^2. - Mitral valve: Moderately calcified annulus.  Mildly thickened   leaflets . There was mild regurgitation. - Left atrium: The atrium was severely dilated. - Right ventricle: The cavity size was mildly dilated. Systolic   function was mildly to moderately reduced. - Right atrium: The atrium was moderately dilated. - Atrial septum: No defect or patent foramen ovale was identified.    Lovenia Kim, MD 05/25/2018, 12:23 PM PGY-3, [place] Intern pager: [phone], text pages welcome

## 2018-05-26 ENCOUNTER — Encounter (HOSPITAL_COMMUNITY): Payer: Self-pay | Admitting: Thoracic Surgery (Cardiothoracic Vascular Surgery)

## 2018-05-26 DIAGNOSIS — I351 Nonrheumatic aortic (valve) insufficiency: Secondary | ICD-10-CM | POA: Diagnosis present

## 2018-05-26 LAB — BASIC METABOLIC PANEL
Anion gap: 14 (ref 5–15)
BUN: 29 mg/dL — AB (ref 6–20)
CO2: 26 mmol/L (ref 22–32)
Calcium: 9.9 mg/dL (ref 8.9–10.3)
Chloride: 100 mmol/L (ref 98–111)
Creatinine, Ser: 6.44 mg/dL — ABNORMAL HIGH (ref 0.61–1.24)
GFR calc Af Amer: 10 mL/min — ABNORMAL LOW (ref >60)
GFR, EST NON AFRICAN AMERICAN: 9 mL/min — AB (ref >60)
Glucose, Bld: 81 mg/dL (ref 70–99)
Potassium: 4.5 mmol/L (ref 3.5–5.1)
SODIUM: 140 mmol/L (ref 135–145)

## 2018-05-26 MED ORDER — CHLORHEXIDINE GLUCONATE CLOTH 2 % EX PADS
6.0000 | MEDICATED_PAD | Freq: Every day | CUTANEOUS | Status: DC
  Administered 2018-05-27: 6 via TOPICAL

## 2018-05-26 NOTE — Progress Notes (Addendum)
Progress Note  Patient Name: [name] Date of Encounter: 05/26/2018  Primary Cardiologist: Thurmon Fair, MD   Subjective   He denies angina.  He denies shortness of breath.  Sitting at bedside eating.  Inpatient Medications    Scheduled Meds: . aspirin  81 mg Oral Daily  . Chlorhexidine Gluconate Cloth  6 each Topical Q0600  . Chlorhexidine Gluconate Cloth  6 each Topical Q0600  . darunavir  800 mg Oral Q supper  . dolutegravir  50 mg Oral Q supper  . enoxaparin (LOVENOX) injection  30 mg Subcutaneous Q24H  . gabapentin  100 mg Oral Daily  . metoprolol tartrate  12.5 mg Oral BID  . pantoprazole  40 mg Oral BID  . ritonavir  100 mg Oral Q supper  . rosuvastatin  5 mg Oral q1800  . sevelamer carbonate  1,600 mg Oral TID WC  . sodium chloride flush  3 mL Intravenous Q12H  . [START ON 05/30/2018] tenofovir  300 mg Oral Weekly   Continuous Infusions: . sodium chloride     PRN Meds: sodium chloride, acetaminophen, ondansetron (ZOFRAN) IV, ondansetron (ZOFRAN) IV, oxyCODONE-acetaminophen, polyvinyl alcohol, sodium chloride flush   Vital Signs    Vitals:   05/25/18 1900 05/25/18 1936 05/25/18 2358 05/26/18 0517  BP: 96/73 (!) 88/71 (!) 83/67 (!) 84/67  Pulse:  71 73 73  Resp:  17 16 19   Temp:   98.2 F (36.8 C) 98.1 F (36.7 C)  TempSrc:   Oral Oral  SpO2:  91% 97% 96%  Weight:    65 kg  Height:        Intake/Output Summary (Last 24 hours) at 05/26/2018 1052 Last data filed at 05/25/2018 2230 Gross per 24 hour  Intake 543 ml  Output -  Net 543 ml   Filed Weights   05/25/18 0655 05/25/18 1044 05/26/18 0517  Weight: 65.3 kg 64.7 kg 65 kg    Telemetry    Sinus rhythm with occasional PVC- Personally Reviewed  ECG    No new study.- Personally Reviewed  Physical Exam  Malnourished appearing GEN: No acute distress.   Neck: No JVD Cardiac: RRR, rubs, or gallops.  3/6 crescendo decrescendo systolic.  This exam is unchanged from prior  days. Respiratory: Clear to auscultation bilaterally. GI: Soft, nontender, non-distended  MS:  Femoral access site is unremarkable Neuro:  Nonfocal  Psych: Normal affect   Labs    Chemistry Recent Labs  Lab 05/22/18 0642  05/16/2018 0650 05/25/18 0500 05/26/18 0431  NA 139   < > 141 136 140  K 5.0   < > 4.3 5.4* 4.5  CL 102   < > 102 97* 100  CO2 24   < > 24 19* 26  GLUCOSE 82   < > 78 126* 81  BUN 31*   < > 28* 46* 29*  CREATININE 9.00*   < > 8.33* 9.74* 6.44*  CALCIUM 9.6   < > 10.0 10.0 9.9  PROT 7.7  --   --   --   --   ALBUMIN 3.1*  --   --  3.0*  --   AST 14*  --   --   --   --   ALT 9  --   --   --   --   ALKPHOS 201*  --   --   --   --   BILITOT 2.6*  --   --   --   --  GFRNONAA 6*   < > 7* 5* 9*  GFRAA 7*   < > 8* 6* 10*  ANIONGAP 13   < > 15 20* 14   < > = values in this interval not displayed.     Hematology Recent Labs  Lab 05/22/18 1610 05/23/18 0248 05/06/2018 0650  WBC 6.3 5.8 5.4  RBC 3.16* 3.21* 3.01*  HGB 10.8* 10.8* 10.3*  HCT 34.0* 34.1* 32.5*  MCV 107.6* 106.2* 108.0*  MCH 34.2* 33.6 34.2*  MCHC 31.8 31.7 31.7  RDW 17.8* 17.6* 18.2*  PLT 75* 70* 85*    Cardiac Enzymes Recent Labs  Lab 05/22/18 0043 05/22/18 0642 05/22/18 2036 05/23/18 0248  TROPONINI 1.35* 1.43* 1.07* 1.19*    Recent Labs  Lab 05/05/2018 1455  TROPIPOC 0.86*     BNP Recent Labs  Lab 05/24/2018 1907  BNP >4,500.0*     DDimer No results for input(s): DDIMER in the last 168 hours.   Radiology    No results found.  Cardiac Studies    Coronary angiography demonstrates occluded first and second diagonals without collaterals, diffusely diseased/occluded ramus intermedius, and high-grade heavily calcified proximal to mid LAD obstruction.  Echocardiography raise the question of low flow low gradient severe aortic stenosis.  There is also at least moderate aortic regurgitation  Echo demonstrated recent deterioration in LV function with EF now 20 to  25%.  Patient Profile     52 y.o. 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  Severe pulmonary hypertension also noted.  Assessment & Plan    1. New systolic heart failure: Volume status being managed by dialysis: Guideline directed medical therapy for systolic heart failure is not possible due to low blood pressures and end-stage renal disease.  The apical anterior wall is akinetic and thinned suggesting that it is not viable.  The proximal and mid septum demonstrates some evidence of thickening during systole. 2. Coronary atherosclerosis with severe calcified proximal to mid LAD/totally occluded diagonals and ramus: After review of digital images and echocardiogram, I have concerned that LV may not be viable in the anterior wall distribution.  In speaking with patient concerning management, he admits he would probably decline open heart surgery.  If therapy of coronary disease is needed, the only other option would be high risk rotational or orbital atherectomy with stenting.  High risk due to significant valvular disease and severe LV dysfunction.  Valve prevents support (AS/AR). 3. Severe calcified aortic valve with stenosis and regurgitation (both at least moderate in severity): Probably not a surgical candidate.  We will get input from the structural heart team about the applicability of TAVR.  Aortic regurgitation may exclude him from consideration  Along with end-stage kidney disease and HIV. 4. End-stage RENAL disease on dialysis: Dialysis again is scheduled for tomorrow. 5. Moderate pulmonary hypertension  -being controlled by dialysis.  No clinical evidence of right heart failure.  Will be seen by the structural heart team.  The patient is not having angina.  He has limited treatment options and a poor long-term prognosis.  I fear that he has partially infarcted the anterior wall.  I do not believe he would accept or do well with surgical  valve and CABG.  Only option would be a hybrid high risk LAD intervention followed by TAVR if possible.  If anterior wall is mostly nonviable, PCI will not be helpful..  Will discuss with colleagues and structural team after they see him officially.      For questions or updates, please contact [facility] Please consult [email or web address] for contact info under Cardiology/STEMI.      Signed, Lesleigh NoeHenry W Smith III, MD  05/26/2018, 10:52 AM

## 2018-05-26 NOTE — Progress Notes (Signed)
[facility] Progress Note   Subjective:  Feels good today after dialysis yesterday.  LHC with significant CAD, new HFrEF, Aortic valve disease.  Cardiology formulating plan.  Objective Vitals:   05/25/18 1900 05/25/18 1936 05/25/18 2358 05/26/18 0517  BP: 96/73 (!) 88/71 (!) 83/67 (!) 84/67  Pulse:  71 73 73  Resp:  17 16 19   Temp:   98.2 F (36.8 C) 98.1 F (36.7 C)  TempSrc:   Oral Oral  SpO2:  91% 97% 96%  Weight:    65 kg  Height:       Physical Exam General: Thin male NAD  Heart: RRR 2/6 systolic murmur  Lungs: CTAB  Abdomen: soft NT +BS Extremities: trace LE edema - R > L (he says chronic for years) Dialysis Access: LUE AVG +bruit   Dialysis Orders:  [facility] 3.5h 400/800 EDW 66kg 2K/2.5Ca Profile 4 Na Linear L AVG No heparin  -Hectorol 3 mcg IV TIW -Venofer 50mg  IV q weeks  -Mircera 100mcg IV q 2 weeks last 8/20  -Ca acetate 4 tid qac --> stopped while hospitalized in light of hyperCa  Assessment/Plan: 1. NSTEMI - w/u per cards/primary team - sounds like intervention will be required while hospitalized. Plan being formulated in light of his multiple comorbidities.   2. ESRD -  HD TTS. HD tomorrow.  EDW 66kg, post weight yesterday 64.7kg.  Will need new EDW on discharge.  3. Hypertension/volume  - BP low/stable. On metoprolol.  Will challenge at next treatment.   4. Anemia  - Hgb 10.3 8/26 - stable on ESA as outpatient; monitor while hospitalized - check tomorrow 5. Metabolic bone disease -  Corr Ca elevated. Using 2Ca bath. Phos 9.0.  He was taking phoslo outpatient but in light of hypercalcemia d/w him and started renvela 1600 TID with meals on 8/28.  6. Nutrition - renal diet/vitamins/prostat for low albumin  7. Aortic stenosis - management plan being formulated by cardiology in conjunction with CAD plan 8. Thrombocytopenia - appears chronic, PTA   9. HIV - cont outpatient meds   Estill BakesLindsay Cade Dashner MD [facility] pager  [phone]   05/26/2018, 10:18 AM    Additional Objective Labs: Basic Metabolic Panel: Recent Labs  Lab 05/26/2018 0650 05/25/18 0500 05/26/18 0431  NA 141 136 140  K 4.3 5.4* 4.5  CL 102 97* 100  CO2 24 19* 26  GLUCOSE 78 126* 81  BUN 28* 46* 29*  CREATININE 8.33* 9.74* 6.44*  CALCIUM 10.0 10.0 9.9  PHOS  --  9.0*  --    CBC: Recent Labs  Lab 05/25/2018 1436 05/22/18 0642 05/23/18 0248 05/12/2018 0650  WBC 6.1 6.3 5.8 5.4  HGB 11.0* 10.8* 10.8* 10.3*  HCT 35.1* 34.0* 34.1* 32.5*  MCV 109.0* 107.6* 106.2* 108.0*  PLT 78* 75* 70* 85*   Blood Culture    Component Value Date/Time   SDES SPUTUM 10/15/2015 0028   SDES SPUTUM 10/15/2015 0028   SPECREQUEST NONE 10/15/2015 0028   SPECREQUEST NONE 10/15/2015 0028   CULT  10/15/2015 0028    MODERATE STAPHYLOCOCCUS AUREUS Note: RIFAMPIN AND GENTAMICIN SHOULD NOT BE USED AS SINGLE DRUGS FOR TREATMENT OF STAPH INFECTIONS. This organism DOES NOT demonstrate inducible Clindamycin resistance in vitro. Performed at [facility]    REPTSTATUS 10/15/2015 FINAL 10/15/2015 0028   REPTSTATUS 10/18/2015 FINAL 10/15/2015 0028    Cardiac Enzymes: Recent Labs  Lab 04/29/2018 1907 05/22/18 0043 05/22/18 0642 05/22/18 2036 05/23/18 0248  TROPONINI 1.31* 1.35*  1.43* 1.07* 1.19*   CBG: No results for input(s): GLUCAP in the last 168 hours. Iron Studies: No results for input(s): IRON, TIBC, TRANSFERRIN, FERRITIN in the last 72 hours. Lab Results  Component Value Date   INR 1.25 01/02/2017   INR 1.07 12/07/2016   INR 0.98 08/04/2015   Medications: . sodium chloride     . aspirin  81 mg Oral Daily  . Chlorhexidine Gluconate Cloth  6 each Topical Q0600  . darunavir  800 mg Oral Q supper  . dolutegravir  50 mg Oral Q supper  . enoxaparin (LOVENOX) injection  30 mg Subcutaneous Q24H  . gabapentin  100 mg Oral Daily  . metoprolol tartrate  12.5 mg Oral BID  . pantoprazole  40 mg Oral BID  . ritonavir  100 mg Oral Q  supper  . rosuvastatin  5 mg Oral q1800  . sevelamer carbonate  1,600 mg Oral TID WC  . sodium chloride flush  3 mL Intravenous Q12H  . [START ON 05/30/2018] tenofovir  300 mg Oral Weekly

## 2018-05-26 NOTE — Progress Notes (Addendum)
0715 Pt sleeping, easy to arouse, wife at bedside, bedside shift report. Updated with POC. NAD, complaints. WCTM.   [email or web address] RN to room assessed pt, pt wants to go back to sleep, RN asked pt to call when ready for his meds. Pt understands, WCTM.   1030 Pt medicated per MAR, pt tearful, stated [facility] is unable to help him and that he's "going to die". RN offered words of encouragement, asked if wanted pastoral care to see him. Pt requested and consult placed.

## 2018-05-26 NOTE — Progress Notes (Signed)
Family Medicine Teaching Service Daily Progress Note Intern Pager: [phone]  Patient name: [name] Medical record number: [id number] Date of birth: [birth year] Age: 52 y.o. Gender: male  Primary Care Provider: Patient, No Pcp Per Consultants: Cardiology Code Status: Full  Pt Overview and Major Events to Date:  8/23 Admit to FPTS for NSTEMI with TTE showing EF 20-25%, started on heparin and nitro drip 8/26 Cardiac cath  Assessment and Plan: Caleb Boquet Cunninghamis a 52 y.o.malepresenting with chest pain and feeling unwell found to have NSTEMI. PMH is significant forHIV, ESRD, HSV, anemia, peripheral neuropathy, anemia, GI bleed, PUD, AS.  1.  Inferolateral NSTEMI:  Acute.  S/p heparin drip which was discontinued 8/26. ECHO 8/25 aortic stenosis, EF 20-25%, diffuse hypokinesis R/L heart cath which showed moderate CAD. HD yesterday for volume overload.  Cardiology recs: Will be seen by the structural heart team.  The patient is not having angina.  He has limited treatment options and a poor long-term prognosis.  I fear that he has partially infarcted the anterior wall.  I do not believe he would accept or do well with surgical valve and CABG.  Only option would be a hybrid high risk LAD intervention followed by TAVR if possible.  If anterior wall is mostly nonviable, PCI will not be helpful..  Will discuss with colleagues and structural team after they see him officially. CT surgery recs: The patient will require rotational atherectomy of LAD followed by possible TAVR vs CABG + SAVR but given the patient's comorbidities, he is a poor surgical candidate. They will confer with the heart team.  Chest pain has resolved on exam today. Consider adding dual platelet therapy since not likely surgical candidate. - f/u cards recs  - Continue aspirin 81 mg daily, Lopressor 12.5 mg twice daily, Crestor 5 mg daily - Telemetry and continuous pulse ox    2.  ESRD on HD:  Chronic.  Stable.  Currently receives HD on T/Th/Sa with LUE AVF.   - plan for HD tomorrow - Nephrology following, appreciate recs   3.  Hypotension:  BP this AM 91/59.  Has history of hypertension.  Has remained below EDW.  Is on dialysis.  Initially given nitro drip 8/25, now discontinued.  Likely related to an NSTEMI.  - Continue Lopressor 12.5 mg BID -monitor vitals and clinical symptomatolgy   4.  HIV: Chronic.  Well-controlled.  Last CD4 780, viral load undetectable. - Continue tivicay and prezista  5.  Post herpetic lesion patient was complaining of intense pain along prior shingles scarring and acutely tender to palpation. Ordered renal dose appropriate gabapentin which greatly improved patient's pain -continue gabapentin 122m daily -monitor pain  7.  HLD: Chronic Lipid Panel normal. LFTs showed AST 14, ALT 9, Alk phos 201 - cont crestor 59mQD   8.  HSV: Chronic, stable On Acyclovir prn.  No current outbreak. - consider acyclovir prn if symptoms arise during hospitalization   9.  PUD: Stable Hx GI in 2018.  On protonix 4071mID at home. Continues to deny melena and BRBPR.  - cont home protonix - cont to monitor CBC  10.  Anemia: Chronic, stable Likely related to CKD.  Baseline ~10.  Hgb this AM 10.3.  - cont to monitor CBC  11.  Peripheral Neuropathy: Chronic Symptoms unchanged. Last Vitamin B12 12/2016, 332. 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 - cont artificial tears prn  13.  Tobacco Abuse Current 0.5 ppd smoker. - encourage tobacco cessation - consider nicotine patch prn  FEN/GI: renal/fluid restricted PPx: Heparin   Disposition: Dispo pending clinical improvement    Subjective:  Patient in HD currently, states he feels better since dialysis started.  Denies CP, SOB, leg swelling.   Objective: Temp:  [97.4 F (36.3 C)-98.2 F (36.8 C)] 98.1 F (36.7 C) (08/28 0517) Pulse Rate:  [65-84] 73 (08/28  0517) Resp:  [16-25] 19 (08/28 0517) BP: (83-106)/(59-91) 84/67 (08/28 0517) SpO2:  [91 %-100 %] 96 % (08/28 0517) Weight:  [64.7 kg-65.3 kg] 65 kg (08/28 0517)   Physical Exam: General: 52 yo male, currently in dialysis  HEENT: normocephalic, atraumatic, MMM, o/p clear  Neck: supple, no LAD  Cardiovascular: RRR,  no rubs, or gallops  Lungs: CTAB, on RA  Skin: warm, dry, no rashes or lesions  Extremities: warm and well perfused, normal tone, no edema  Laboratory: Recent Labs  Lab 05/22/18 0642 05/23/18 0248 05/10/2018 0650  WBC 6.3 5.8 5.4  HGB 10.8* 10.8* 10.3*  HCT 34.0* 34.1* 32.5*  PLT 75* 70* 85*   Recent Labs  Lab 05/22/18 0642  05/06/2018 0650 05/25/18 0500 05/26/18 0431  NA 139   < > 141 136 140  K 5.0   < > 4.3 5.4* 4.5  CL 102   < > 102 97* 100  CO2 24   < > 24 19* 26  BUN 31*   < > 28* 46* 29*  CREATININE 9.00*   < > 8.33* 9.74* 6.44*  CALCIUM 9.6   < > 10.0 10.0 9.9  PROT 7.7  --   --   --   --   BILITOT 2.6*  --   --   --   --   ALKPHOS 201*  --   --   --   --   ALT 9  --   --   --   --   AST 14*  --   --   --   --   GLUCOSE 82   < > 78 126* 81   < > = values in this interval not displayed.   Troponin I: 1.19<1.07<1.43<1.35<1.31 B12: 388 (WNL) Folate: 9.0 (WNL) Lipid panel: LDL 87 (WNL) BNP: >4,500 A1c: 4.5 TSH: 1.763 Magnesium: 2.2  Imaging/Diagnostic Tests: EKG-12 LEAD (05/23/2018) NSR, ST and T wave abnormalities suspicious for inferior lateral ischemia (leads II, III, aVF, V4, V5, V6), prolonged QTC 501, left atrial enlargement, rightward axis  TRANSTHORACIC ECHOCARDIOGRAM (05/23/2018) Study Conclusions - Left ventricle: The cavity size was normal. Wall thickness was   increased in a pattern of mild LVH. Systolic function was   severely reduced. The estimated ejection fraction was in the   range of 20% to 25%. Diffuse hypokinesis. Features are consistent   with a pseudonormal left ventricular filling pattern, with   concomitant abnormal  relaxation and increased filling pressure   (grade 2 diastolic dysfunction). Doppler parameters are   consistent with high ventricular filling pressure. - Aortic valve: Moderately to severely calcified annulus. Severely   thickened leaflets. There is low flow low gradient severe aortic   stenosis based on available data. The CW Doppler profile of the   AV is very limited, the true gradient may be higher than   reported. There was moderate regurgitation. Mean gradient (S): 18   mm Hg. VTI ratio of LVOT to aortic valve: 0.25. Valve area (VTI):   0.78 cm^2. Valve area (Vmax): 0.81  cm^2. Valve area (Vmean): 0.75   cm^2. - Mitral valve: Moderately calcified annulus. Mildly thickened   leaflets . There was mild regurgitation. - Left atrium: The atrium was severely dilated. - Right ventricle: The cavity size was mildly dilated. Systolic   function was mildly to moderately reduced. - Right atrium: The atrium was moderately dilated. - Atrial septum: No defect or patent foramen ovale was identified.  Catheterization 8/26 Evidence for coronary calcification with total occlusion of an inferior branch of the first diagonal vessel, 95% LAD stenosis after the septal perforating artery and at the takeoff of a diffusely diseased second diagonal branch in a large LAD vessel which wraps around the apex; a ramus intermediate vessel which bifurcates into 2 branches which has 30% narrowing in the inferior branch and 90% narrowing in the mid superior branch; normal AV groove circumflex vessel; dominant RCA with 20% proximal and 20% mid stenoses with mid distal 95% stenosis in an anterior RV marginal branch (not able to be shown on the diagram).  Significant elevation right heart pressures with moderately severe pulmonary hypertension.  Low gradient at least moderate aortic valve stenosis with a mean gradient of 18 mm Hg, peak to peak gradient of 14 mm Hg, and peak instantaneous gradient of 28 mm. Aortic valve  area of 1.2 to 1.3 cm.  Previous echo Doppler ejection fraction 20 to 25% not assessed during this catheterization due to the markedly elevated LVEDP.  RECOMMENDATION: Patient will be undergoing dialysis with his volume overload. Will review angiogram with colleagues. Options include possible PCI to the LAD versus surgical assessment of candidacy of AVR/CABG.  Echocardiogram 05/23/2018:  Personally reviewed.  There is both aortic stenosis and regurgitation (both appear to be at least moderate).  LV is mildly dilated with global hypokinesis more severe in the distal septum and apical region.  EF in 25 to 30% range.  Study Conclusions  - Left ventricle: The cavity size was normal. Wall thickness was increased in a pattern of mild LVH. Systolic function was severely reduced. The estimated ejection fraction was in the range of 20% to 25%. Diffuse hypokinesis. Features are consistent with a pseudonormal left ventricular filling pattern, with concomitant abnormal relaxation and increased filling pressure (grade 2 diastolic dysfunction). Doppler parameters are consistent with high ventricular filling pressure. - Aortic valve: Moderately to severely calcified annulus. Severely thickened leaflets. There is low flow low gradient severe aortic stenosis based on available data. The CW Doppler profile of the AV is very limited, the true gradient may be higher than reported. There was moderate regurgitation. Mean gradient (S): 18 mm Hg. VTI ratio of LVOT to aortic valve: 0.25. Valve area (VTI): 0.78 cm^2. Valve area (Vmax): 0.81 cm^2. Valve area (Vmean): 0.75 cm^2. - Mitral valve: Moderately calcified annulus. Mildly thickened leaflets . There was mild regurgitation. - Left atrium: The atrium was severely dilated. - Right ventricle: The cavity size was mildly dilated. Systolic function was mildly to moderately reduced. - Right atrium: The atrium was  moderately dilated. - Atrial septum: No defect or patent foramen ovale was identified.  Richarda Osmond, DO 05/26/2018, 6:07 AM PGY-1, [place] Intern pager: [phone], text pages welcome

## 2018-05-27 LAB — CBC
HEMATOCRIT: 31.8 % — AB (ref 39.0–52.0)
HEMOGLOBIN: 10.9 g/dL — AB (ref 13.0–17.0)
MCH: 35.9 pg — AB (ref 26.0–34.0)
MCHC: 34.3 g/dL (ref 30.0–36.0)
MCV: 104.6 fL — AB (ref 78.0–100.0)
Platelets: 65 10*3/uL — ABNORMAL LOW (ref 150–400)
RBC: 3.04 MIL/uL — AB (ref 4.22–5.81)
RDW: 18.5 % — ABNORMAL HIGH (ref 11.5–15.5)
WBC: 9.3 10*3/uL (ref 4.0–10.5)

## 2018-05-27 LAB — BASIC METABOLIC PANEL
ANION GAP: 16 — AB (ref 5–15)
BUN: 48 mg/dL — ABNORMAL HIGH (ref 6–20)
CHLORIDE: 99 mmol/L (ref 98–111)
CO2: 21 mmol/L — AB (ref 22–32)
Calcium: 9.4 mg/dL (ref 8.9–10.3)
Creatinine, Ser: 7.98 mg/dL — ABNORMAL HIGH (ref 0.61–1.24)
GFR calc non Af Amer: 7 mL/min — ABNORMAL LOW (ref >60)
GFR, EST AFRICAN AMERICAN: 8 mL/min — AB (ref >60)
Glucose, Bld: 89 mg/dL (ref 70–99)
Potassium: 4.8 mmol/L (ref 3.5–5.1)
Sodium: 136 mmol/L (ref 135–145)

## 2018-05-27 MED ORDER — HYDROCORTISONE NA SUCCINATE PF 250 MG IJ SOLR
200.0000 mg | Freq: Once | INTRAMUSCULAR | Status: AC
  Administered 2018-05-28: 200 mg via INTRAVENOUS
  Filled 2018-05-27: qty 200

## 2018-05-27 MED ORDER — SODIUM CHLORIDE 0.9 % IV SOLN: 100.0000 mL | INTRAVENOUS | Status: DC | PRN

## 2018-05-27 MED ORDER — PENTAFLUOROPROP-TETRAFLUOROETH EX AERO: 1.0000 "application " | INHALATION_SPRAY | CUTANEOUS | Status: DC | PRN

## 2018-05-27 MED ORDER — HEPARIN SODIUM (PORCINE) 1000 UNIT/ML DIALYSIS: 1000.0000 [IU] | INTRAMUSCULAR | Status: DC | PRN

## 2018-05-27 MED ORDER — LIDOCAINE HCL (PF) 1 % IJ SOLN: 5.0000 mL | INTRAMUSCULAR | Status: DC | PRN

## 2018-05-27 MED ORDER — ACETAMINOPHEN 325 MG PO TABS
ORAL_TABLET | ORAL | Status: AC
  Filled 2018-05-27: qty 2

## 2018-05-27 MED ORDER — SODIUM CHLORIDE 0.9 % IV SOLN
INTRAVENOUS | Status: DC
  Administered 2018-05-27: 21:00:00 via INTRAVENOUS

## 2018-05-27 MED ORDER — LIDOCAINE-PRILOCAINE 2.5-2.5 % EX CREA: 1.0000 "application " | TOPICAL_CREAM | CUTANEOUS | Status: DC | PRN

## 2018-05-27 MED ORDER — DIPHENHYDRAMINE HCL 25 MG PO CAPS
50.0000 mg | ORAL_CAPSULE | Freq: Once | ORAL | Status: AC
  Administered 2018-05-28: 50 mg via ORAL
  Filled 2018-05-27 (×2): qty 2

## 2018-05-27 NOTE — Progress Notes (Signed)
Patient's BP 78/52 manual, a couple were taken automatic and ranging about the same.  MD is aware of BP, patient not showing any symptoms of sob, dizziness, or lightheadedness.  I will keep monitoring the patient.

## 2018-05-27 NOTE — Progress Notes (Addendum)
  [facility]   MULTIDISCIPLINARY HEART VALVE TEAM  Currently getting hemodialysis, with 4 hour run. Plan is for pre-TAVR CT scans as well as TEE to further investigate aortic valve pathology.  I have arranged for inpatient CT scans today to be done after HD.  The patient has a contrast dye allergy so he will be premedicated with Solu-Medrol and Benadryl.  I have requested an 18-gauge antecubital IV be placed by nursing.  He is also scheduled for a TEE tomorrow, time not yet set.  He will need to be n.p.o. after midnight.  Cline CrockKathryn Melchizedek Espinola PA-C  [facility]  [phone]

## 2018-05-27 NOTE — Procedures (Signed)
I was present at this dialysis session, have reviewed the session itself and made  appropriate changes Estill BakesLindsay Virlee Stroschein MD [facility] pager [phone]   05/27/2018, 9:08 AM

## 2018-05-27 NOTE — Progress Notes (Signed)
Family Medicine Teaching Service Daily Progress Note Intern Pager: [phone]  Patient name: [name] Medical record number: [id number] Date of birth: 1966-05-30 Age: 52 y.o. Gender: male  Primary Care Provider: Patient, No Pcp Per Consultants: Cardiology Code Status: Full  Pt Overview and Major Events to Date:  8/23 Admit to FPTS for NSTEMI with TTE showing EF 20-25%, started on heparin and nitro drip 8/26 Cardiac cath  Assessment and Plan: [name] a 52 y.o.malepresenting with chest pain and feeling unwell found to have NSTEMI. PMH is significant forHIV, ESRD, HSV, anemia, peripheral neuropathy, anemia, GI bleed, PUD, AS.  1.  Inferolateral NSTEMI:  Acute. ECHO 8/25 aortic stenosis, EF 20-25%, diffuse hypokinesis. Heart cath 8/26. CT and TEE 8/30. Possible PCI/TAVR 9/3 pending results of imaging and heart team recs. Poor surgical candidate - f/u cards recs  - Continue aspirin 81 mg daily, Lopressor 12.5 mg twice daily, Crestor 5 mg daily - Telemetry and continuous pulse ox    2.  ESRD on HD:  Chronic.  Stable. Currently receives HD on T/Th/Sa with LUE AVF.  -received Hd today  - Nephrology following, appreciate recs   3.  Hypotension:  BP this AM 91/67.  Has history of hypertension.  Has remained below EDW.  Is on dialysis.home med metoprolol 12.5 BID - Continue Lopressor 12.5 mg BID -monitor vitals and clinical symptomatolgy   4.  HIV: Chronic.  Well-controlled.  Last CD4 780, viral load undetectable. - Continue tivicay and prezista  5.  Post herpetic lesion  Ordered renal dose appropriate gabapentin which greatly improved patient's pain -continue gabapentin 140m daily -monitor pain  7.  HLD: Chronic Lipid Panel normal. LFTs showed AST 14, ALT 9, Alk phos 201 - cont crestor 575mQD   8.  HSV: Chronic, stable On Acyclovir prn.  No current outbreak. - consider acyclovir prn if symptoms arise during hospitalization   9.  PUD: Stable Hx GI  in 2018.  On protonix 4084mID at home. Continues to deny melena and BRBPR.  - cont home protonix - cont to monitor CBC  10.  Anemia: Chronic, stable Likely related to CKD.  Baseline ~10.  Hgb this AM 10.3.  - cont to monitor CBC  11.  Peripheral Neuropathy: Chronic Symptoms unchanged. Last Vitamin B12 12/2016, 332. 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 - cont artificial tears prn  13.  Tobacco Abuse Current 0.5 ppd smoker. - encourage tobacco cessation - consider nicotine patch prn  FEN/GI: renal/fluid restricted PPx: Heparin   Disposition: Dispo pending clinical improvement    Subjective:  Patient in HD currently, states he feels better since dialysis started.  Denies CP, SOB, leg swelling.   Objective: Temp:  [97.7 F (36.5 C)-98 F (36.7 C)] 97.7 F (36.5 C) (08/28 1904) Pulse Rate:  [70-76] 71 (08/29 0006) Resp:  [17-19] 17 (08/29 0006) BP: (78-104)/(51-69) 78/52 (08/29 0045) SpO2:  [45 %-100 %] 100 % (08/29 0006)   Physical Exam: General: 52 0 male, currently in dialysis  HEENT: normocephalic, atraumatic, MMM, o/p clear  Neck: supple, no LAD  Cardiovascular: RRR,  no rubs, or gallops  Lungs: CTAB, on RA  Skin: warm, dry, no rashes or lesions  Extremities: warm and well perfused, normal tone, no edema  Laboratory: Recent Labs  Lab 05/22/18 0642 05/23/18 0248 05/15/2018 0650  WBC 6.3 5.8 5.4  HGB 10.8* 10.8* 10.3*  HCT 34.0* 34.1*  32.5*  PLT 75* 70* 85*   Recent Labs  Lab 05/22/18 0642  05/25/18 0500 05/26/18 0431 05/27/18 0528  NA 139   < > 136 140 136  K 5.0   < > 5.4* 4.5 4.8  CL 102   < > 97* 100 99  CO2 24   < > 19* 26 21*  BUN 31*   < > 46* 29* 48*  CREATININE 9.00*   < > 9.74* 6.44* 7.98*  CALCIUM 9.6   < > 10.0 9.9 9.4  PROT 7.7  --   --   --   --   BILITOT 2.6*  --   --   --   --   ALKPHOS 201*  --   --   --   --   ALT 9  --   --   --   --   AST 14*  --   --   --    --   GLUCOSE 82   < > 126* 81 89   < > = values in this interval not displayed.   Troponin I: 1.19<1.07<1.43<1.35<1.31 B12: 388 (WNL) Folate: 9.0 (WNL) Lipid panel: LDL 87 (WNL) BNP: >4,500 A1c: 4.5 TSH: 1.763 Magnesium: 2.2  Imaging/Diagnostic Tests: EKG-12 LEAD (05/23/2018) NSR, ST and T wave abnormalities suspicious for inferior lateral ischemia (leads II, III, aVF, V4, V5, V6), prolonged QTC 501, left atrial enlargement, rightward axis  TRANSTHORACIC ECHOCARDIOGRAM (05/23/2018) Study Conclusions - Left ventricle: The cavity size was normal. Wall thickness was   increased in a pattern of mild LVH. Systolic function was   severely reduced. The estimated ejection fraction was in the   range of 20% to 25%. Diffuse hypokinesis. Features are consistent   with a pseudonormal left ventricular filling pattern, with   concomitant abnormal relaxation and increased filling pressure   (grade 2 diastolic dysfunction). Doppler parameters are   consistent with high ventricular filling pressure. - Aortic valve: Moderately to severely calcified annulus. Severely   thickened leaflets. There is low flow low gradient severe aortic   stenosis based on available data. The CW Doppler profile of the   AV is very limited, the true gradient may be higher than   reported. There was moderate regurgitation. Mean gradient (S): 18   mm Hg. VTI ratio of LVOT to aortic valve: 0.25. Valve area (VTI):   0.78 cm^2. Valve area (Vmax): 0.81 cm^2. Valve area (Vmean): 0.75   cm^2. - Mitral valve: Moderately calcified annulus. Mildly thickened   leaflets . There was mild regurgitation. - Left atrium: The atrium was severely dilated. - Right ventricle: The cavity size was mildly dilated. Systolic   function was mildly to moderately reduced. - Right atrium: The atrium was moderately dilated. - Atrial septum: No defect or patent foramen ovale was identified.  Catheterization 8/26 Evidence for coronary calcification  with total occlusion of an inferior branch of the first diagonal vessel, 95% LAD stenosis after the septal perforating artery and at the takeoff of a diffusely diseased second diagonal branch in a large LAD vessel which wraps around the apex; a ramus intermediate vessel which bifurcates into 2 branches which has 30% narrowing in the inferior branch and 90% narrowing in the mid superior branch; normal AV groove circumflex vessel; dominant RCA with 20% proximal and 20% mid stenoses with mid distal 95% stenosis in an anterior RV marginal branch (not able to be shown on the diagram).  Significant elevation right heart pressures with moderately severe pulmonary hypertension.  Low gradient at least moderate aortic valve stenosis with a mean gradient of 18 mm Hg, peak to peak gradient of 14 mm Hg, and peak instantaneous gradient of 28 mm. Aortic valve area of 1.2 to 1.3 cm.  Previous echo Doppler ejection fraction 20 to 25% not assessed during this catheterization due to the markedly elevated LVEDP.  RECOMMENDATION: Patient will be undergoing dialysis with his volume overload. Will review angiogram with colleagues. Options include possible PCI to the LAD versus surgical assessment of candidacy of AVR/CABG.  Echocardiogram 05/23/2018:  Personally reviewed.  There is both aortic stenosis and regurgitation (both appear to be at least moderate).  LV is mildly dilated with global hypokinesis more severe in the distal septum and apical region.  EF in 25 to 30% range.  Study Conclusions  - Left ventricle: The cavity size was normal. Wall thickness was increased in a pattern of mild LVH. Systolic function was severely reduced. The estimated ejection fraction was in the range of 20% to 25%. Diffuse hypokinesis. Features are consistent with a pseudonormal left ventricular filling pattern, with concomitant abnormal relaxation and increased filling pressure (grade 2 diastolic dysfunction).  Doppler parameters are consistent with high ventricular filling pressure. - Aortic valve: Moderately to severely calcified annulus. Severely thickened leaflets. There is low flow low gradient severe aortic stenosis based on available data. The CW Doppler profile of the AV is very limited, the true gradient may be higher than reported. There was moderate regurgitation. Mean gradient (S): 18 mm Hg. VTI ratio of LVOT to aortic valve: 0.25. Valve area (VTI): 0.78 cm^2. Valve area (Vmax): 0.81 cm^2. Valve area (Vmean): 0.75 cm^2. - Mitral valve: Moderately calcified annulus. Mildly thickened leaflets . There was mild regurgitation. - Left atrium: The atrium was severely dilated. - Right ventricle: The cavity size was mildly dilated. Systolic function was mildly to moderately reduced. - Right atrium: The atrium was moderately dilated. - Atrial septum: No defect or patent foramen ovale was identified.  Richarda Osmond, DO 05/27/2018, 6:10 AM PGY-1, [place] Intern pager: [phone], text pages welcome

## 2018-05-27 NOTE — Progress Notes (Addendum)
The patient has been seen in conjunction with Theodore Demark, PA-C. All aspects of care have been considered and discussed. The patient has been personally interviewed, examined, and all clinical data has been reviewed.   Will discuss with structural heart team.  Interventional role with reference to high risk PCI will be planned after discussion.  This procedure would carry significant risk as atherectomy needs to be performed and we are unlikely to be able to use Impella to support the patient hemodynamically.  This will need to be coordinated with interventional list in the lab next week, perhaps Tuesday.  We will give further details after we see results of TEE.  Progress Note  Patient Name: [name] Date of Encounter: 05/27/2018  Primary Cardiologist: Thurmon Fair, MD   Subjective   In HD, c/o L shoulder pain where he had shingles, breathing ok on HD, no chest pain  Inpatient Medications    Scheduled Meds: . aspirin  81 mg Oral Daily  . Chlorhexidine Gluconate Cloth  6 each Topical Q0600  . Chlorhexidine Gluconate Cloth  6 each Topical Q0600  . darunavir  800 mg Oral Q supper  . diphenhydrAMINE  50 mg Oral Once  . dolutegravir  50 mg Oral Q supper  . enoxaparin (LOVENOX) injection  30 mg Subcutaneous Q24H  . gabapentin  100 mg Oral Daily  . hydrocortisone sod succinate (SOLU-CORTEF) inj  200 mg Intravenous Once  . metoprolol tartrate  12.5 mg Oral BID  . pantoprazole  40 mg Oral BID  . ritonavir  100 mg Oral Q supper  . rosuvastatin  5 mg Oral q1800  . sevelamer carbonate  1,600 mg Oral TID WC  . sodium chloride flush  3 mL Intravenous Q12H  . [START ON 05/30/2018] tenofovir  300 mg Oral Weekly   Continuous Infusions: . sodium chloride    . sodium chloride    . sodium chloride     PRN Meds: sodium chloride, sodium chloride, sodium chloride, acetaminophen, heparin, lidocaine (PF), lidocaine-prilocaine, ondansetron (ZOFRAN) IV, ondansetron (ZOFRAN) IV,  oxyCODONE-acetaminophen, pentafluoroprop-tetrafluoroeth, polyvinyl alcohol, sodium chloride flush   Vital Signs    Vitals:   05/27/18 0800 05/27/18 0815 05/27/18 0845 05/27/18 0900  BP: (!) 77/54 (!) 75/53 (!) 81/47 (!) 84/58  Pulse: 64 66 67 67  Resp: 20 (!) 25    Temp:      TempSrc:      SpO2:      Weight:      Height:        Intake/Output Summary (Last 24 hours) at 05/27/2018 0903 Last data filed at 05/26/2018 2148 Gross per 24 hour  Intake 243 ml  Output -  Net 243 ml   Filed Weights   05/26/18 0517 05/27/18 0636 05/27/18 0740  Weight: 65 kg 66.5 kg 66.5 kg    Telemetry    SR, occ PVCs- Personally Reviewed  ECG    No new study.- Personally Reviewed  Physical Exam   General: Well developed, thin, frail, male in no acute distress Head: Eyes PERRLA, No xanthomas.   Normocephalic and atraumatic  Lungs: few rales bases bilaterally Heart: HRRR S1 S2, 2/6 SEM  Pulses are 2+ & equal. Minimal JVD. Abdomen: Bowel sounds are present, abdomen soft and non-tender without masses or  hernias noted. Msk: Normal strength and tone for age. Extremities: No clubbing, cyanosis or edema.    Skin:  No rashes or lesions noted. Neuro: Alert and oriented X 3. Psych:  Good affect, responds  appropriately   Labs    Chemistry Recent Labs  Lab 05/22/18 [phone]  05/25/18 0500 05/26/18 0431 05/27/18 0528  NA 139   < > 136 140 136  K 5.0   < > 5.4* 4.5 4.8  CL 102   < > 97* 100 99  CO2 24   < > 19* 26 21*  GLUCOSE 82   < > 126* 81 89  BUN 31*   < > 46* 29* 48*  CREATININE 9.00*   < > 9.74* 6.44* 7.98*  CALCIUM 9.6   < > 10.0 9.9 9.4  PROT 7.7  --   --   --   --   ALBUMIN 3.1*  --  3.0*  --   --   AST 14*  --   --   --   --   ALT 9  --   --   --   --   ALKPHOS 201*  --   --   --   --   BILITOT 2.6*  --   --   --   --   GFRNONAA 6*   < > 5* 9* 7*  GFRAA 7*   < > 6* 10* 8*  ANIONGAP 13   < > 20* 14 16*   < > = values in this interval not displayed.     Hematology Recent Labs   Lab 05/23/18 0248 05/04/2018 0650 05/27/18 0528  WBC 5.8 5.4 9.3  RBC 3.21* 3.01* 3.04*  HGB 10.8* 10.3* 10.9*  HCT 34.1* 32.5* 31.8*  MCV 106.2* 108.0* 104.6*  MCH 33.6 34.2* 35.9*  MCHC 31.7 31.7 34.3  RDW 17.6* 18.2* 18.5*  PLT 70* 85* 65*    Cardiac Enzymes Recent Labs  Lab 05/22/18 0043 05/22/18 0642 05/22/18 2036 05/23/18 0248  TROPONINI 1.35* 1.43* 1.07* 1.19*    Recent Labs  Lab 05/17/2018 1455  TROPIPOC 0.86*     BNP Recent Labs  Lab 05/07/2018 1907  BNP >4,500.0*     Radiology    No results found.  Cardiac Studies    Coronary angiography demonstrates occluded first and second diagonals without collaterals, diffusely diseased/occluded ramus intermedius, and high-grade heavily calcified proximal to mid LAD obstruction.  Echocardiography raise the question of low flow low gradient severe aortic stenosis.  There is also at least moderate aortic regurgitation  Echo demonstrated recent deterioration in LV function with EF now 20 to 25%.  Patient Profile     52 y.o. 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  Severe pulmonary hypertension also noted.  Assessment & Plan    1. New systolic heart failure: Volume status being managed by dialysis:  He is on ASA, low-dose BB, statin, not able to up-titrate or add new meds due to BP and ESRD. The apical wall may not be viable. prox-mid septum do thicken some during systole.   2. Coronary atherosclerosis with severe calcified proximal to mid LAD/totally occluded diagonals and ramus: Dr Katrinka Blazing reviewed images and echo, LV may not be  viable in the anterior wall. Not sure pt would agree to OHS. Consider HSRA/DES but would be high risk. Valve dz makes procedure high risk and prevents support.  3. Severe calcified aortic valve with stenosis and regurgitation (both at least moderate in severity): Structural heart team will see, has CT/TEE ordered. May be TAVR candidate, prob not OHS candidate due to AR, HIV, ESRD.   4. End-stage RENAL disease on dialysis: Getting HD today 5. Moderate pulmonary hypertension - Mgt w/ HD, no S&S PAH right now.  Structural heart team to see. CT today, TEE tomorrow, pt aware. Dr Katrinka BlazingSmith to discuss w/ them to determine the possibililty/timing of high-risk PCI +/- TAVR     For questions or updates, please contact [facility] Please consult [email or web address] for contact info under Cardiology/STEMI.      Signed, Theodore Demarkhonda Barrett, PA-C  05/27/2018, 9:03 AM

## 2018-05-27 NOTE — Progress Notes (Signed)
Pharmacist Heart Failure Core Measure Documentation  Assessment: Caleb PeltJeffrey E [name] has an EF documented as 20-25% on  On 05/23/18  by ECHO.  Rationale: Heart failure patients with left ventricular systolic dysfunction (LVSD) and an EF < 40% should be prescribed an angiotensin converting enzyme inhibitor (ACEI) or angiotensin receptor blocker (ARB) at discharge unless a contraindication is documented in the medical record.  This patient is not currently on an ACEI or ARB for HF.  This note is being placed in the record in order to provide documentation that a contraindication to the use of these agents is present for this encounter.  ACE Inhibitor or Angiotensin Receptor Blocker is contraindicated (specify all that apply)  []   ACEI allergy AND ARB allergy []   Angioedema []   Moderate or severe aortic stenosis []   Hyperkalemia []   Hypotension []   Renal artery stenosis [x]   Worsening renal function, preexisting renal disease or dysfunction   Caleb DownerJessica Ameria [name], Pharm D, BCPS, [facility] Clinical Pharmacist Phone [phone]  05/27/2018 1:36 PM

## 2018-05-27 NOTE — Progress Notes (Signed)
[facility] Progress Note   Subjective:  Feels good today after dialysis yesterday.  LHC with significant CAD, new HFrEF, Aortic valve disease.  Cardiology formulating plan but with multiple issues not easy answer and prognosis is quite poor.   Objective Vitals:   05/27/18 0002 05/27/18 0006 05/27/18 0045 05/27/18 0636  BP: (!) 78/51  (!) 78/52 (!) 82/65  Pulse: 73 71    Resp:  17    Temp:      TempSrc:      SpO2:  100%    Weight:    66.5 kg  Height:    5\' 11"  (1.803 m)   Physical Exam General: Thin male NAD  Heart: RRR 3/6 systolic murmur  Lungs: CTAB  Abdomen: soft NT +BS Extremities: trace LE edema - R > L (he says chronic for years) Dialysis Access: LUE AVG +bruit   Dialysis Orders:  [facility] 3.5h 400/800 EDW 66kg 2K/2.5Ca Profile 4 Na Linear L AVG No heparin  -Hectorol 3 mcg IV TIW -Venofer 50mg  IV q weeks  -Mircera 100mcg IV q 2 weeks last 8/20  -Ca acetate 4 tid qac --> stopped while hospitalized in light of hyperCa  Assessment/Plan: 1. NSTEMI, CAD, AoV pathology- w/u per cards/primary team - sounds like intervention will be required while hospitalized. Plan being formulated in light of his multiple comorbidities.   2. ESRD -  HD TTS. HD today.  EDW 66kg, post weight last tx 64.7kg.  Wt this AM 66.5kg but BP quite low.  Will try for 1.5-2L with aid of low temp HD + profile, but may not achieve.  Will need new EDW on discharge.  3. Hypertension/volume  - BP low/stable. On metoprolol.  Per above, challenging EDW today. 4. Anemia  - Hgb 10.9 8/29 - stable on ESA as outpatient; monitor while hospitalized 5. Metabolic bone disease -  Corr Ca elevated on admission, now normalized using 2Ca bath and off Ca based binder. Using 2Ca bath. Phos 9.0 8/27, check tomorrow.  He was taking phoslo outpatient but in light of hypercalcemia d/w him and started renvela 1600 TID with meals on 8/28.  6. Nutrition - renal diet/vitamins/prostat for low albumin  7. Aortic  stenosis - management plan being formulated by cardiology in conjunction with CAD plan 8. Thrombocytopenia - appears chronic, PTA   9. HIV - cont outpatient meds   Estill BakesLindsay Jennice Renegar MD [facility] pager [phone]   05/27/2018, 7:28 AM    Additional Objective Labs: Basic Metabolic Panel: Recent Labs  Lab 05/25/18 0500 05/26/18 0431 05/27/18 0528  NA 136 140 136  K 5.4* 4.5 4.8  CL 97* 100 99  CO2 19* 26 21*  GLUCOSE 126* 81 89  BUN 46* 29* 48*  CREATININE 9.74* 6.44* 7.98*  CALCIUM 10.0 9.9 9.4  PHOS 9.0*  --   --    CBC: Recent Labs  Lab 01-16-18 1436 05/22/18 0642 05/23/18 0248 05/07/2018 0650 05/27/18 0528  WBC 6.1 6.3 5.8 5.4 9.3  HGB 11.0* 10.8* 10.8* 10.3* 10.9*  HCT 35.1* 34.0* 34.1* 32.5* 31.8*  MCV 109.0* 107.6* 106.2* 108.0* 104.6*  PLT 78* 75* 70* 85* 65*   Blood Culture    Component Value Date/Time   SDES SPUTUM 10/15/2015 0028   SDES SPUTUM 10/15/2015 0028   SPECREQUEST NONE 10/15/2015 0028   SPECREQUEST NONE 10/15/2015 0028   CULT  10/15/2015 0028    MODERATE STAPHYLOCOCCUS AUREUS Note: RIFAMPIN AND GENTAMICIN SHOULD NOT BE USED AS SINGLE DRUGS FOR TREATMENT  OF STAPH INFECTIONS. This organism DOES NOT demonstrate inducible Clindamycin resistance in vitro. Performed at [facility]    REPTSTATUS 10/15/2015 FINAL 10/15/2015 0028   REPTSTATUS 10/18/2015 FINAL 10/15/2015 0028    Cardiac Enzymes: Recent Labs  Lab 05/16/2018 1907 05/22/18 0043 05/22/18 0642 05/22/18 2036 05/23/18 0248  TROPONINI 1.31* 1.35* 1.43* 1.07* 1.19*   CBG: No results for input(s): GLUCAP in the last 168 hours. Iron Studies: No results for input(s): IRON, TIBC, TRANSFERRIN, FERRITIN in the last 72 hours. Lab Results  Component Value Date   INR 1.25 01/02/2017   INR 1.07 12/07/2016   INR 0.98 08/04/2015   Medications: . sodium chloride     . aspirin  81 mg Oral Daily  . Chlorhexidine Gluconate Cloth  6 each Topical Q0600  .  Chlorhexidine Gluconate Cloth  6 each Topical Q0600  . darunavir  800 mg Oral Q supper  . dolutegravir  50 mg Oral Q supper  . enoxaparin (LOVENOX) injection  30 mg Subcutaneous Q24H  . gabapentin  100 mg Oral Daily  . metoprolol tartrate  12.5 mg Oral BID  . pantoprazole  40 mg Oral BID  . ritonavir  100 mg Oral Q supper  . rosuvastatin  5 mg Oral q1800  . sevelamer carbonate  1,600 mg Oral TID WC  . sodium chloride flush  3 mL Intravenous Q12H  . [START ON 05/30/2018] tenofovir  300 mg Oral Weekly

## 2018-05-28 ENCOUNTER — Encounter (HOSPITAL_COMMUNITY): Payer: Self-pay | Admitting: Cardiovascular Disease

## 2018-05-28 ENCOUNTER — Inpatient Hospital Stay (HOSPITAL_COMMUNITY): Payer: Medicare Other

## 2018-05-28 ENCOUNTER — Inpatient Hospital Stay (HOSPITAL_COMMUNITY): Payer: Medicare Other | Admitting: Anesthesiology

## 2018-05-28 ENCOUNTER — Encounter (HOSPITAL_COMMUNITY): Admission: EM | Disposition: E | Payer: Self-pay | Source: Home / Self Care | Attending: Family Medicine

## 2018-05-28 DIAGNOSIS — I350 Nonrheumatic aortic (valve) stenosis: Secondary | ICD-10-CM

## 2018-05-28 DIAGNOSIS — I2510 Atherosclerotic heart disease of native coronary artery without angina pectoris: Secondary | ICD-10-CM

## 2018-05-28 HISTORY — PX: TEE WITHOUT CARDIOVERSION: SHX5443

## 2018-05-28 SURGERY — ECHOCARDIOGRAM, TRANSESOPHAGEAL
Anesthesia: Monitor Anesthesia Care

## 2018-05-28 MED ORDER — PHENYLEPHRINE 40 MCG/ML (10ML) SYRINGE FOR IV PUSH (FOR BLOOD PRESSURE SUPPORT)
PREFILLED_SYRINGE | INTRAVENOUS | Status: DC | PRN
  Administered 2018-05-28 (×2): 80 ug via INTRAVENOUS

## 2018-05-28 MED ORDER — HYDROCORTISONE NA SUCCINATE PF 250 MG IJ SOLR
200.0000 mg | Freq: Once | INTRAMUSCULAR | Status: AC
  Administered 2018-05-28: 200 mg via INTRAVENOUS
  Filled 2018-05-28: qty 200

## 2018-05-28 MED ORDER — DIPHENHYDRAMINE HCL 50 MG/ML IJ SOLN
50.0000 mg | Freq: Once | INTRAMUSCULAR | Status: DC
  Filled 2018-05-28: qty 1

## 2018-05-28 MED ORDER — NITROGLYCERIN 0.4 MG SL SUBL: 0.8000 mg | SUBLINGUAL_TABLET | SUBLINGUAL | Status: DC | PRN

## 2018-05-28 MED ORDER — PRO-STAT SUGAR FREE PO LIQD
30.0000 mL | Freq: Two times a day (BID) | ORAL | Status: DC
  Administered 2018-05-28 – 2018-05-30 (×4): 30 mL via ORAL
  Filled 2018-05-28 (×16): qty 30

## 2018-05-28 MED ORDER — SODIUM CHLORIDE 0.9 % IV SOLN: INTRAVENOUS | Status: DC

## 2018-05-28 MED ORDER — MIDAZOLAM HCL 5 MG/5ML IJ SOLN
INTRAMUSCULAR | Status: DC | PRN
  Administered 2018-05-28 (×3): 1 mg via INTRAVENOUS

## 2018-05-28 MED ORDER — BUTAMBEN-TETRACAINE-BENZOCAINE 2-2-14 % EX AERO
INHALATION_SPRAY | CUTANEOUS | Status: DC | PRN
  Administered 2018-05-28: 2 via TOPICAL

## 2018-05-28 MED ORDER — DIPHENHYDRAMINE HCL 25 MG PO CAPS
50.0000 mg | ORAL_CAPSULE | Freq: Once | ORAL | Status: AC
  Administered 2018-05-28: 50 mg via ORAL
  Filled 2018-05-28: qty 2

## 2018-05-28 MED ORDER — MIDAZOLAM HCL 5 MG/ML IJ SOLN
INTRAMUSCULAR | Status: AC
  Filled 2018-05-28: qty 1

## 2018-05-28 MED ORDER — DIPHENHYDRAMINE HCL 50 MG/ML IJ SOLN
INTRAMUSCULAR | Status: AC
  Filled 2018-05-28: qty 1

## 2018-05-28 MED ORDER — SODIUM CHLORIDE 0.9 % IV SOLN
INTRAVENOUS | Status: AC
  Administered 2018-05-28: 18:00:00 via INTRAVENOUS

## 2018-05-28 MED ORDER — IOPAMIDOL (ISOVUE-370) INJECTION 76%
100.0000 mL | Freq: Once | INTRAVENOUS | Status: AC | PRN
  Administered 2018-05-28: 80 mL via INTRAVENOUS

## 2018-05-28 NOTE — Progress Notes (Signed)
Family Medicine Teaching Service Daily Progress Note Intern Pager: [phone]  Patient name: [name] Medical record number: [id number] Date of birth: 11-Jun-1966 Age: 52 y.o. Gender: male  Primary Care Provider: Patient, No Pcp Per Consultants: Cardiology Code Status: Full  Pt Overview and Major Events to Date:  8/23 Admit to FPTS for NSTEMI with TTE showing EF 20-25%, started on heparin and nitro drip 8/26 Cardiac cath  Assessment and Plan: Caleb Carrick Cunninghamis a 52 y.o.malepresenting with chest pain and feeling unwell found to have NSTEMI. PMH is significant forHIV, ESRD, HSV, anemia, peripheral neuropathy, anemia, GI bleed, PUD, AS.  1.  Inferolateral NSTEMI:  Acute. ECHO 8/25 aortic stenosis, EF 20-25%, diffuse hypokinesis. Heart cath 8/26. CT and TEE 8/30. Possible PCI/TAVR 9/3 pending results of imaging and heart team recs. Poor surgical candidate - f/u cards recs  - Continue aspirin 81 mg daily, Lopressor 12.5 mg twice daily, Crestor 5 mg daily - Telemetry and continuous pulse ox  -NPO today prior to TEE  2.  ESRD on HD:  Chronic.  Stable. Currently receives HD on T/Th/Sa with LUE AVF.  -received Hd yesterday - Nephrology following, appreciate recs   3.  Hypotension:  BP this AM 80/41.  Has history of hypertension.  Has remained below EDW.  Is on dialysis. home med metoprolol 12.5 BID. Consider decreasing dose due to hypotension although asymptomatic - Continue Lopressor 12.5 mg BID -monitor vitals and clinical symptomatolgy   4.  HIV: Chronic.  Well-controlled.  Last CD4 780, viral load undetectable. - Continue tivicay and prezista  5.  Post herpetic lesion - pain well controlled today without medication d/t NPO status -continue gabapentin 11m daily -monitor pain  7.  HLD: Chronic Lipid Panel normal. LFTs showed AST 14, ALT 9, Alk phos 201 - cont crestor 564mQD   8.  HSV: Chronic, stable On Acyclovir prn.  No current outbreak. - consider  acyclovir prn if symptoms arise during hospitalization   9.  PUD: Stable Hx GI in 2018.  On protonix 4044mID at home. - cont home protonix - cont to monitor CBC  10.  Anemia: Chronic, stable Likely related to CKD.  Baseline ~10.  Hgb this AM 10.9  - cont to monitor CBC  11.  Peripheral Neuropathy: Chronic Symptoms unchanged. Last Vitamin B12 12/2016, 332. 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 - cont artificial tears prn  13.  Tobacco Abuse Current 0.5 ppd smoker. - encourage tobacco cessation - consider nicotine patch prn  FEN/GI: renal/fluid restricted PPx: Heparin   Disposition: Dispo pending decision on heart procedure  Subjective:  Overnight: no acute events Today: patient feels well, no pain. Ready for his test to be over so he can eat  Objective: Temp:  [97.5 F (36.4 C)-98.2 F (36.8 C)] 98 F (36.7 C) (08/30 0400) Pulse Rate:  [64-72] 66 (08/30 0400) Resp:  [10-25] 18 (08/30 0400) BP: (75-115)/(45-67) 115/46 (08/30 0400) SpO2:  [94 %-100 %] 100 % (08/30 0400) Weight:  [65.8 kg-66.5 kg] 65.8 kg (08/29 1116)   Physical Exam: General: [age] male, currently in dialysis  HEENT: normocephalic, atraumatic, MMM, o/p clear  Neck: supple, no LAD  Cardiovascular: RRR,  no rubs, or gallops  Lungs: CTAB, ORA  Extremities: warm and well perfused, normal tone, no edema  Laboratory: Recent Labs  Lab 05/23/18 0248 05/23/2018 0650 05/27/18 0528  WBC 5.8 5.4 9.3  HGB  10.8* 10.3* 10.9*  HCT 34.1* 32.5* 31.8*  PLT 70* 85* 65*   Recent Labs  Lab 05/22/18 0642  05/25/18 0500 05/26/18 0431 05/27/18 0528  NA 139   < > 136 140 136  K 5.0   < > 5.4* 4.5 4.8  CL 102   < > 97* 100 99  CO2 24   < > 19* 26 21*  BUN 31*   < > 46* 29* 48*  CREATININE 9.00*   < > 9.74* 6.44* 7.98*  CALCIUM 9.6   < > 10.0 9.9 9.4  PROT 7.7  --   --   --   --   BILITOT 2.6*  --   --   --   --   ALKPHOS 201*  --   --    --   --   ALT 9  --   --   --   --   AST 14*  --   --   --   --   GLUCOSE 82   < > 126* 81 89   < > = values in this interval not displayed.   CT chest, abdomen, coronary, and TEE pending today  Richarda Osmond, DO 05/22/2018, 7:24 AM PGY-1, [place] Intern pager: [phone], text pages welcome

## 2018-05-28 NOTE — Anesthesia Procedure Notes (Signed)
Procedure Name: MAC Date/Time: 05/17/2018 1:36 PM Performed by: Orlie Dakin, CRNA Pre-anesthesia Checklist: Patient identified, Emergency Drugs available, Suction available and Patient being monitored Patient Re-evaluated:Patient Re-evaluated prior to induction Oxygen Delivery Method: Nasal cannula Preoxygenation: Pre-oxygenation with 100% oxygen Induction Type: IV induction

## 2018-05-28 NOTE — Anesthesia Postprocedure Evaluation (Signed)
Anesthesia Post Note  Patient: [name]  Procedure(s) Performed: TRANSESOPHAGEAL ECHOCARDIOGRAM (TEE) (N/A )     Patient location during evaluation: PACU Anesthesia Type: MAC Level of consciousness: awake Pain management: pain level controlled Vital Signs Assessment: post-procedure vital signs reviewed and stable Respiratory status: spontaneous breathing, nonlabored ventilation, respiratory function stable and patient connected to nasal cannula oxygen Cardiovascular status: blood pressure returned to baseline Anesthetic complications: no    Last Vitals:  Vitals:   04/30/2018 1246 04/30/2018 1350  BP: (!) 86/60 (!) 78/55  Pulse: (!) 59 (!) 59  Resp: 12 (!) 22  Temp: (!) 36.1 C (!) 36.1 C  SpO2:  100%    Last Pain:  Vitals:   04/29/2018 1350  TempSrc: Temporal  PainSc:                  Audry Pili

## 2018-05-28 NOTE — Care Management Important Message (Signed)
Important Message  Patient Details  Name: Caleb PeltJeffrey E XXXCunningham MRN: [id number] Date of Birth: [birth year]   Medicare Important Message Given:  Yes    Enza Shone P Almir Botts 05/16/2018, 4:22 PM

## 2018-05-28 NOTE — Progress Notes (Signed)
Pts B/P 60s and 70s systolic Family Practice Resident notified, new orders for fluids received and implemented, BP checked with improvement to 90s systolic.  Raymon MuttonGwen Chigozie Basaldua RN

## 2018-05-28 NOTE — H&P (View-Only) (Signed)
Progress Note  Patient Name: [name] Date of Encounter: 05/25/2018  Primary Cardiologist: Thurmon FairMihai Croitoru, MD   Subjective   He is up and ambulatory this morning.  Denies chest pain and shortness of breath.  Has been tolerating dialysis without hemodynamic consequences or cardiac symptoms.  Inpatient Medications    Scheduled Meds: . aspirin  81 mg Oral Daily  . Chlorhexidine Gluconate Cloth  6 each Topical Q0600  . Chlorhexidine Gluconate Cloth  6 each Topical Q0600  . darunavir  800 mg Oral Q supper  . dolutegravir  50 mg Oral Q supper  . enoxaparin (LOVENOX) injection  30 mg Subcutaneous Q24H  . gabapentin  100 mg Oral Daily  . metoprolol tartrate  12.5 mg Oral BID  . pantoprazole  40 mg Oral BID  . ritonavir  100 mg Oral Q supper  . rosuvastatin  5 mg Oral q1800  . sevelamer carbonate  1,600 mg Oral TID WC  . sodium chloride flush  3 mL Intravenous Q12H  . [START ON 05/30/2018] tenofovir  300 mg Oral Weekly   Continuous Infusions: . sodium chloride    . sodium chloride 20 mL/hr at 05/27/2018 0600   PRN Meds: sodium chloride, acetaminophen, ondansetron (ZOFRAN) IV, ondansetron (ZOFRAN) IV, oxyCODONE-acetaminophen, polyvinyl alcohol, sodium chloride flush   Vital Signs    Vitals:   05/27/18 2017 04/29/2018 0045 05/12/2018 0400 05/05/2018 0801  BP: (!) 84/55 (!) 92/45 (!) 115/46 (!) 80/41  Pulse: 70 65 66 70  Resp: 20 18 18 18   Temp: (!) 97.5 F (36.4 C) 98.2 F (36.8 C) 98 F (36.7 C) 97.6 F (36.4 C)  TempSrc: Oral Oral Oral Oral  SpO2: 100% 100% 100% 100%  Weight:      Height:        Intake/Output Summary (Last 24 hours) at 05/23/2018 0941 Last data filed at 05/14/2018 0600 Gross per 24 hour  Intake 478.53 ml  Output 649 ml  Net -170.47 ml   Filed Weights   05/27/18 0636 05/27/18 0740 05/27/18 1116  Weight: 66.5 kg 66.5 kg 65.8 kg    Telemetry    Normal sinus rhythm with occasional PVCs.- Personally Reviewed  ECG    No new tracing.-  Personally Reviewed  Physical Exam  Cachectic appearing. GEN: No acute distress.   Neck: No JVD Cardiac: RRR, rubs, or gallops.  Systolic and diastolic right upper sternal border murmur associated with aortic valve disease. Respiratory: Clear to auscultation bilaterally. GI: Soft, nontender, non-distended  MS: No edema; No deformity. Neuro:  Nonfocal  Psych: Normal affect   Labs    Chemistry Recent Labs  Lab 05/22/18 0642  05/25/18 0500 05/26/18 0431 05/27/18 0528  NA 139   < > 136 140 136  K 5.0   < > 5.4* 4.5 4.8  CL 102   < > 97* 100 99  CO2 24   < > 19* 26 21*  GLUCOSE 82   < > 126* 81 89  BUN 31*   < > 46* 29* 48*  CREATININE 9.00*   < > 9.74* 6.44* 7.98*  CALCIUM 9.6   < > 10.0 9.9 9.4  PROT 7.7  --   --   --   --   ALBUMIN 3.1*  --  3.0*  --   --   AST 14*  --   --   --   --   ALT 9  --   --   --   --  ALKPHOS 201*  --   --   --   --   BILITOT 2.6*  --   --   --   --   GFRNONAA 6*   < > 5* 9* 7*  GFRAA 7*   < > 6* 10* 8*  ANIONGAP 13   < > 20* 14 16*   < > = values in this interval not displayed.     Hematology Recent Labs  Lab 05/23/18 0248 05/18/2018 0650 05/27/18 0528  WBC 5.8 5.4 9.3  RBC 3.21* 3.01* 3.04*  HGB 10.8* 10.3* 10.9*  HCT 34.1* 32.5* 31.8*  MCV 106.2* 108.0* 104.6*  MCH 33.6 34.2* 35.9*  MCHC 31.7 31.7 34.3  RDW 17.6* 18.2* 18.5*  PLT 70* 85* 65*    Cardiac Enzymes Recent Labs  Lab 05/22/18 0043 05/22/18 0642 05/22/18 2036 05/23/18 0248  TROPONINI 1.35* 1.43* 1.07* 1.19*    Recent Labs  Lab 05/06/2018 1455  TROPIPOC 0.86*     BNP Recent Labs  Lab 05/05/2018 1907  BNP >4,500.0*     DDimer No results for input(s): DDIMER in the last 168 hours.   Radiology    No results found.  Cardiac Studies    Coronary angiography demonstrates occluded first and second diagonals without collaterals, diffusely diseased/occluded ramus intermedius, and high-grade heavily calcified proximal to mid LAD  obstruction.  Echocardiography raise the question of low flow low gradient severe aortic stenosis.  There is also at least moderate aortic regurgitation  Echo demonstrated recent deterioration in LV function with EF now 20 to 25%.  Patient Profile     52 y.o. 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  Severe pulmonary hypertension also noted.  Assessment & Plan    1. 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 2. Calcific coronary artery disease: High-grade obstruction proximal to mid LAD.  Successful therapy will likely require atherectomy.  I have alerted both Dr. Clifton James and Dr. Excell Seltzer to coordinate management with Dr. Cornelius Moras.  Patient tentatively on the schedule for Dr. Clifton James next Tuesday.  Operator could change based on conversation.  I am out of town next week. 3. New severe systolic and diastolic heart failure likely combination related to #1 and #2.  Volume status being maintained by dialysis. 4. End-stage CKD on chronic dialysis for greater than 10 years. 5. Positive HIV. 6. Moderate pulmonary hypertension: No clinical evidence of this post dialysis.  On the day of catheterization, dialysis have been performed 48 hours earlier.       For questions or updates, please contact [facility] Please consult [email or web address] for contact info under Cardiology/STEMI.      Signed, Lesleigh Noe, MD  05/14/2018, 9:41 AM

## 2018-05-28 NOTE — Progress Notes (Signed)
[address]       [name] [address]             [phone]     CARDIOTHORACIC SURGERY PROGRESS NOTE  Day of Surgery  S/P Procedure(s) (LRB): TRANSESOPHAGEAL ECHOCARDIOGRAM (TEE) (N/A)  Subjective: Currently very sleepy from all of the Benadryl he's received  Objective: Vital signs in last 24 hours: Temp:  [97 F (36.1 C)-98.2 F (36.8 C)] 97 F (36.1 C) (08/30 1350) Pulse Rate:  [59-70] 60 (08/30 1656) Cardiac Rhythm: Normal sinus rhythm (08/30 0700) Resp:  [12-32] 18 (08/30 1656) BP: (70-115)/(41-66) 70/58 (08/30 1656) SpO2:  [52 %-100 %] 100 % (08/30 1410)  Physical Exam:  Rhythm:   sinus  Breath sounds: Shallow but clear  Heart sounds:  RRR w/ systolic murmur  Incisions:  n/a  Abdomen:  soft  Extremities:  warm   Intake/Output from previous day: 08/29 0701 - 08/30 0700 In: 478.5 [P.O.:300; I.V.:178.5] Out: 649  Intake/Output this shift: Total I/O In: 100 [I.V.:100] Out: -   Lab Results: Recent Labs    05/27/18 0528  WBC 9.3  HGB 10.9*  HCT 31.8*  PLT 65*   BMET:  Recent Labs    05/26/18 0431 05/27/18 0528  NA 140 136  K 4.5 4.8  CL 100 99  CO2 26 21*  GLUCOSE 81 89  BUN 29* 48*  CREATININE 6.44* 7.98*  CALCIUM 9.9 9.4    CBG (last 3)  No results for input(s): GLUCAP in the last 72 hours. PT/INR:  No results for input(s): LABPROT, INR in the last 72 hours.  CXR:  N/A   Transesophageal Echocardiography  Patient:    [name] MR #:       [id number] Study Date: 05/02/2018 Gender:     M Age:        52 Height:     180.3 cm Weight:     65.8 kg BSA:        1.81 m^2 Pt. Status: Room:       6E04C   ADMITTING    Renold Don  ATTENDING    Gildardo Griffes, Katie  REFERRING    [place], Katie  PERFORMING   Chilton Si, MD  SONOGRAPHER  [facility]  cc:  ------------------------------------------------------------------- LV EF: 15% -    20%  ------------------------------------------------------------------- Indications:      Aortic stenosis 424.1.  ------------------------------------------------------------------- History:   PMH:  NSTEMI  Risk factors:  HIV disease End stage renal disease  ------------------------------------------------------------------- Study Conclusions  - Left ventricle: The cavity size was moderately dilated. Systolic   function was severely reduced. The estimated ejection fraction   was in the range of 15% to 20%. Wall motion was normal; there   were no regional wall motion abnormalities. - Aortic valve: Trileaflet; severely thickened, moderately   calcified leaflets; fusion of the left-noncoronary commissure.   Cusp separation was reduced. Valve mobility was restricted. There   was moderate stenosis. There was moderate to severe   regurgitation. Peak velocity (S): 275 cm/s. Mean gradient (S): 19   mm Hg. Valve area (VTI): 1.2 cm^2. Valve area (Vmax): 1.58 cm^2.   Valve area (Vmean): 1.5 cm^2. Valve area by planimetry 0.79 cm^2. - Mitral valve: There was mild regurgitation. - Left atrium: No evidence of thrombus in the atrial cavity or   appendage. No evidence of thrombus in the atrial cavity or   appendage. - Right  ventricle: The cavity size was normal. Wall thickness was   normal. Systolic function was severely reduced. - Right atrium: No evidence of thrombus in the atrial cavity or   appendage.  Impressions:  - Aortic stenosis is severe by planimetry but moderate by mean   gradient and peak velocity. Given the reduced systolic function   and severely calcified, consider low-flow, low-gradient severe   aortic stenosis.  ------------------------------------------------------------------- Study data:   Study status:  Routine.  Consent:  The risks, benefits, and alternatives to the procedure were explained to the patient and informed consent was obtained.  Procedure:  The  patient reported no pain pre or post test. Initial setup. The patient was brought to the laboratory. Surface ECG leads were monitored. Sedation. Conscious sedation was administered by anesthesiology staff. Transesophageal echocardiography. Topical anesthesia was obtained using viscous lidocaine. An adult multiplane transesophageal probe was inserted by the attending cardiologistwithout difficulty. Image quality was adequate. The study was technically difficult, as a result of poor patient compliance.  Study completion:  The patient tolerated the procedure well. There were no complications.          Diagnostic transesophageal echocardiography.  2D and color Doppler. Birthdate:  Patient birthdate: 1966/03/17.  Age:  Patient is 52 yr old.  Sex:  Gender: male.    BMI: 20.2 kg/m^2.  Blood pressure: 78/55  Patient status:  Inpatient.  Study date:  Study date: 04/29/2018. Study time: 01:09 PM.  Location:  Endoscopy.  -------------------------------------------------------------------  ------------------------------------------------------------------- Left ventricle:  The cavity size was moderately dilated. Systolic function was severely reduced. The estimated ejection fraction was in the range of 15% to 20%. Wall motion was normal; there were no regional wall motion abnormalities.  ------------------------------------------------------------------- Aortic valve:   Trileaflet; severely thickened, moderately calcified leaflets; fusion of the left-noncoronary commissure. Cusp separation was reduced. Valve mobility was restricted. Valve area by planimetry 0.79 cm^2.  Doppler:   There was moderate stenosis. There was moderate to severe regurgitation.    VTI ratio of LVOT to aortic valve: 0.42. Valve area (VTI): 1.2 cm^2. Indexed valve area (VTI): 0.66 cm^2/m^2. Peak velocity ratio of LVOT to aortic valve: 0.56. Valve area (Vmax): 1.58 cm^2. Indexed valve area (Vmax): 0.87 cm^2/m^2. Mean  velocity ratio of LVOT to aortic valve: 0.53. Valve area (Vmean): 1.5 cm^2. Indexed valve area (Vmean): 0.83 cm^2/m^2.   Mean gradient (S): 19 mm Hg. Peak gradient (S): 30 mm Hg.  ------------------------------------------------------------------- Aorta:  There was no atheroma. There was no evidence for dissection. Aortic root: The aortic root was not dilated. Ascending aorta: The ascending aorta was normal in size. Aortic arch: The aortic arch was normal in size. Descending aorta: The descending aorta was normal in size.  ------------------------------------------------------------------- Mitral valve:   Structurally normal valve.   Leaflet separation was normal.  Doppler:  There was mild regurgitation.  ------------------------------------------------------------------- Left atrium:  The atrium was normal in size.  No evidence of thrombus in the atrial cavity or appendage.  No evidence of thrombus in the atrial cavity or appendage. The appendage was morphologically a left appendage, multilobulated, and of normal size. Emptying velocity was normal.  ------------------------------------------------------------------- Right ventricle:  The cavity size was normal. Wall thickness was normal. Systolic function was severely reduced.  ------------------------------------------------------------------- Pulmonic valve:    Structurally normal valve.  ------------------------------------------------------------------- Tricuspid valve:   Structurally normal valve.   Leaflet separation was normal.  Doppler:  There was no significant regurgitation.  ------------------------------------------------------------------- Pulmonary artery:   The main pulmonary artery was normal-sized.  -------------------------------------------------------------------  Right atrium:  The atrium was normal in size.  No evidence of thrombus in the atrial cavity or appendage. The appendage  was morphologically a right appendage.  ------------------------------------------------------------------- Pericardium:  There was no pericardial effusion.  ------------------------------------------------------------------- Measurements   Left ventricle                           Value  Stroke volume, 2D                        82    ml  Stroke volume/bsa, 2D                    45    ml/m^2    LVOT                                     Value  LVOT ID, S                               19    mm  LVOT area                                2.84  cm^2  LVOT peak velocity, S                    153   cm/s  LVOT mean velocity, S                    110   cm/s  LVOT VTI, S                              28.9  cm  LVOT peak gradient, S                    9     mm Hg    Aortic valve                             Value  Aortic valve peak velocity, S            275   cm/s  Aortic valve mean velocity, S            208   cm/s  Aortic valve VTI, S                      68.4  cm  Aortic mean gradient, S                  19    mm Hg  Aortic peak gradient, S                  30    mm Hg  VTI ratio, LVOT/AV                       0.42  Aortic valve area, VTI                   1.2   cm^2  Aortic valve area/bsa, VTI  0.66  cm^2/m^2  Velocity ratio, peak, LVOT/AV            0.56  Aortic valve area, peak velocity         1.58  cm^2  Aortic valve area/bsa, peak velocity     0.87  cm^2/m^2  Velocity ratio, mean, LVOT/AV            0.53  Aortic valve area, mean velocity         1.5   cm^2  Aortic valve area/bsa, mean velocity     0.83  cm^2/m^2  Aortic regurg pressure half-time         211   ms  Legend: (L)  and  (H)  mark values outside specified reference range.  ------------------------------------------------------------------- Prepared and Electronically Authenticated by  Chilton Si, MD 08/23/2019T16:29:55     Assessment/Plan: S/P Procedure(s) (LRB): TRANSESOPHAGEAL  ECHOCARDIOGRAM (TEE) (N/A)  I have personally reviewed Mr Blizzard's TEE.  His CT angiogram has yet to be performed.  The patient is currently heavily sedated secondary to Benadryl he has received in anticipation of the CT angiogram.  It would appear that he has received plenty of Benadryl and I favor proceeding with his scan as soon as practical.  The patient's TEE demonstrates the presence of severe low gradient low ejection fraction aortic stenosis and moderate to severe aortic insufficiency.  The patient has severe left ventricular dysfunction with ejection fraction estimated only 15%.  By planimetry the patient's aortic valve area measured 0.8 cm.  Risks associated with conventional surgical aortic valve replacement and coronary artery bypass grafting would be extremely high.  As long as the patient's CT angiogram demonstrates anatomical characteristics suitable for transcatheter aortic valve replacement, I favor a staged approach with high risk PCI of the left anterior descending coronary artery as soon as practical followed by transcatheter aortic valve replacement in the near future.   I spent in excess of 15 minutes during the conduct of this hospital encounter and >50% of this time involved direct face-to-face encounter with the patient for counseling and/or coordination of their care.     Purcell Nails, MD 05/28/2018 5:40 PM

## 2018-05-28 NOTE — Progress Notes (Signed)
  Echocardiogram Echocardiogram Transesophageal has been performed.  Kaydance Bowie L Androw 05/09/2018, 1:58 PM

## 2018-05-28 NOTE — Progress Notes (Signed)
[facility] Progress Note   Subjective:  Seen in room. No overnight CP or dyspnea. Still with ongoing L shoulder PHN pains. Plan is for TEE today and then plan for possible coronary stenting next week.  Objective Vitals:   05/27/18 2017 05/17/2018 0045 05/12/2018 0400 05/12/2018 0801  BP: (!) 84/55 (!) 92/45 (!) 115/46 (!) 80/41  Pulse: 70 65 66 70  Resp: 20 18 18 18   Temp: (!) 97.5 F (36.4 C) 98.2 F (36.8 C) 98 F (36.7 C) 97.6 F (36.4 C)  TempSrc: Oral Oral Oral Oral  SpO2: 100% 100% 100% 100%  Weight:      Height:       Physical Exam General: Well appearing man, NAD Heart: RRR; 2/6 systolic murmur Lungs: CTAB Extremities: No LE edema Dialysis Access:  LUE AVG + thrill  Additional Objective Labs: Basic Metabolic Panel: Recent Labs  Lab 05/25/18 0500 05/26/18 0431 05/27/18 0528  NA 136 140 136  K 5.4* 4.5 4.8  CL 97* 100 99  CO2 19* 26 21*  GLUCOSE 126* 81 89  BUN 46* 29* 48*  CREATININE 9.74* 6.44* 7.98*  CALCIUM 10.0 9.9 9.4  PHOS 9.0*  --   --    Liver Function Tests: Recent Labs  Lab 05/22/18 0642 05/25/18 0500  AST 14*  --   ALT 9  --   ALKPHOS 201*  --   BILITOT 2.6*  --   PROT 7.7  --   ALBUMIN 3.1* 3.0*   CBC: Recent Labs  Lab 05/19/2018 1436 05/22/18 0642 05/23/18 0248 05/09/2018 0650 05/27/18 0528  WBC 6.1 6.3 5.8 5.4 9.3  HGB 11.0* 10.8* 10.8* 10.3* 10.9*  HCT 35.1* 34.0* 34.1* 32.5* 31.8*  MCV 109.0* 107.6* 106.2* 108.0* 104.6*  PLT 78* 75* 70* 85* 65*   Cardiac Enzymes: Recent Labs  Lab 05/29/2018 1907 05/22/18 0043 05/22/18 0642 05/22/18 2036 05/23/18 0248  TROPONINI 1.31* 1.35* 1.43* 1.07* 1.19*   Medications: . sodium chloride    . sodium chloride 20 mL/hr at 05/23/2018 0600   . aspirin  81 mg Oral Daily  . Chlorhexidine Gluconate Cloth  6 each Topical Q0600  . Chlorhexidine Gluconate Cloth  6 each Topical Q0600  . darunavir  800 mg Oral Q supper  . dolutegravir  50 mg Oral Q supper  . enoxaparin  (LOVENOX) injection  30 mg Subcutaneous Q24H  . gabapentin  100 mg Oral Daily  . metoprolol tartrate  12.5 mg Oral BID  . pantoprazole  40 mg Oral BID  . ritonavir  100 mg Oral Q supper  . rosuvastatin  5 mg Oral q1800  . sevelamer carbonate  1,600 mg Oral TID WC  . sodium chloride flush  3 mL Intravenous Q12H  . [START ON 05/30/2018] tenofovir  300 mg Oral Weekly    Dialysis Orders: TTS at [facility] 3.5h 400/800 EDW 66kg 2K/2.5Ca Profile 4 Na Linear L AVG No heparin  -Hectorol 3 mcg IV TIW -Venofer 50mg  IV q weeks  -Mircera IV q 2 weeks last 8/20  -Ca acetate 4 tid qac--> stopped while hospitalized in light of hyperCa  Assessment/Plan: 1. NSTEMI: Followed by [facility] showing severe CAD, HFrEF (EF 20-25%), severe calcified AoV: Cardiology formulating plan. TEE today and possible TAVR and/or stenting early next week. 2. ESRD: Will continue HD per TTS schedule. Next 8/31. 3. HTN/volume: BP low at times, on metoprolol. Getting well below prior EDW, will change on d/c. No edema. 4. Anemia: Hgb 10.9. Not due for  ESA yet. 5. Secondary hyperparathyroidism: Corr Ca high on admit, improved with changing to non-Ca binder on 8/28 and changing to 2Ca bath. 6. Nutrition: Alb low. Adding pro-stat supps. 7. HIV: Continue home meds. 8. Thrombocytopenia: Chronic issue, stable.  Ozzie HoyleKatie Mariany Mackintosh, PA-C Feb 18, 2018, 9:35 AM  [facility] Pager: [phone]) [phone]

## 2018-05-28 NOTE — Interval H&P Note (Signed)
History and Physical Interval Note:  05/29/2018 1:27 PM  [name]  has presented today for surgery, with the diagnosis of aortic valve stenosis.  The various methods of treatment have been discussed with the patient and family. After consideration of risks, benefits and other options for treatment, the patient has consented to  Procedure(s): TRANSESOPHAGEAL ECHOCARDIOGRAM (TEE) (N/A) as a surgical intervention .  The patient's history has been reviewed, patient examined, no change in status, stable for surgery.  I have reviewed the patient's chart and labs.  Questions were answered to the patient's satisfaction.     Chilton Siiffany Letona, MD

## 2018-05-28 NOTE — Transfer of Care (Signed)
Immediate Anesthesia Transfer of Care Note  Patient: [name]  Procedure(s) Performed: TRANSESOPHAGEAL ECHOCARDIOGRAM (TEE) (N/A )  Patient Location: Endoscopy Unit  Anesthesia Type:MAC  Level of Consciousness: drowsy  Airway & Oxygen Therapy: Patient Spontanous Breathing and Patient connected to nasal cannula oxygen  Post-op Assessment: Report given to RN and Post -op Vital signs reviewed and stable  Post vital signs: Reviewed and stable  Last Vitals:  Vitals Value Taken Time  BP    Temp    Pulse    Resp 0 05/17/2018  1:49 PM  SpO2    Vitals shown include unvalidated device data.  Last Pain:  Vitals:   05/17/2018 1246  TempSrc: Temporal  PainSc:       Patients Stated Pain Goal: 0 (91/98/02 2179)  Complications: No apparent anesthesia complications

## 2018-05-28 NOTE — CV Procedure (Signed)
Brief TEE Note  LVEF 15% Moderate right ventricular systolic dysfunction Severely calcified aortic valve with restriction of all 3 leaflets Moderate aortic stenosis by mean gradient and peak velocity Severe aortic stenosis by planimetry (0.8 cm^2). Likely low flow-low gradient severe aortic stenosis. Moderate to severe aortic regurgitation.  PHT 211 ms. Mild mitral regurgitation No LAA thrombus on limited views  A full study was not performed as the patient was lightly sedated due to baseline hypotension and comorbidities.  For additional details see full report.  Libby Goehring C. Duke Salviaandolph, MD, [facility] 05/27/2018 1:53 PM

## 2018-05-28 NOTE — Anesthesia Preprocedure Evaluation (Addendum)
Anesthesia Evaluation  Patient identified by MRN, date of birth, ID band Patient awake    Reviewed: Allergy & Precautions, NPO status , Patient's Chart, lab work & pertinent test results  History of Anesthesia Complications Negative for: history of anesthetic complications  Airway Mallampati: III  TM Distance: >3 FB Neck ROM: Full    Dental  (+) Dental Advisory Given, Edentulous Upper   Pulmonary Current Smoker,    breath sounds clear to auscultation       Cardiovascular hypertension, + CAD and + Past MI  + Valvular Problems/Murmurs AS, AI and MR  Rhythm:Regular Rate:Normal + Systolic murmurs Severe pulmonary hypertension  '19 Cath - 1st Diag lesion is 100% stenosed. Prox LAD to Mid LAD lesion is 95% stenosed. 2nd Diag lesion is 99% stenosed. 1st Mrg lesion is 90% stenosed. Prox RCA to Mid RCA lesion is 20% stenosed. Mid RCA lesion is 20% stenosed.  Evidence for coronary calcification with total occlusion of an inferior branch of the first diagonal vessel, 95% LAD stenosis after the septal perforating artery and at the takeoff of a diffusely diseased second diagonal branch in a large LAD vessel which wraps around the apex; a ramus intermediate vessel which bifurcates into 2 branches which has 30% narrowing in the inferior branch and 90% narrowing in the mid superior branch; normal AV groove circumflex vessel; dominant RCA with 20% proximal and 20% mid stenoses with mid distal 95% stenosis in an anterior RV marginal branch (not able to be shown on the diagram). Significant elevation right heart pressures with moderately severe pulmonary hypertension.  Low gradient at least moderate aortic valve stenosis with a mean gradient of 18 mm Hg, peak to peak gradient of 14 mm Hg, and peak instantaneous gradient of 28 mm.  Aortic valve area of 1.2 to 1.3 cm. Previous echo Doppler ejection fraction 20 to 25% not assessed during this  catheterization due to the markedly elevated LVEDP.  '19 TTE - Mild LVH. EF 20% to 25%. Diffuse hypokinesis. Grade 2 diastolic dysfunction. There is low flow low gradient severe AS based on available data. The CW Doppler profile of the AV is very limited, the true gradient may be higher than reported. There was moderate AI. Mean gradient (S): 66m Hg. VTI ratio of LVOT to aortic valve: 0.25. Valve area (VTI): 0.78 cm^2. Valve area (Vmax): 0.81 cm^2. Valve area (Vmean): 0.75 cm^2. Mild MR. Severely dilated LA. Mildly dilated RV. RV systolic   function was mildly to moderately reduced. RA moderately dilated.     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  Reproductive/Obstetrics                           Anesthesia Physical Anesthesia Plan  ASA: IV  Anesthesia Plan: MAC   Post-op Pain Management:    Induction: Intravenous  PONV Risk Score and Plan: 1 and Treatment may vary due to age or medical condition  Airway Management Planned: Nasal Cannula and Natural Airway  Additional Equipment: None  Intra-op Plan:   Post-operative Plan:   Informed Consent: I have reviewed the patients History and Physical, chart, labs and  discussed the procedure including the risks, benefits and alternatives for the proposed anesthesia with the patient or authorized representative who has indicated his/her understanding and acceptance.   Dental advisory given  Plan Discussed with: CRNA and Anesthesiologist  Anesthesia Plan Comments: (Lengthy discussion had with patient regarding his critical illness, including pulmonary hypertension,  severe AS, CAD, and CHF. Discussed with patient inherent risks of anesthesia, including death. Patient understanding that he will receive small amount of amnestic medication via IV as well as local anesthetic orally. Patient understands he will be awake for this procedure due to the health issues mentioned above. Patient agreeable to proceed.)        Anesthesia Quick Evaluation

## 2018-05-28 NOTE — Progress Notes (Signed)
Progress Note  Patient Name: Caleb PeltJeffrey E XXXCunningham Date of Encounter: 05/25/2018  Primary Cardiologist: Thurmon FairMihai Croitoru, MD   Subjective   He is up and ambulatory this morning.  Denies chest pain and shortness of breath.  Has been tolerating dialysis without hemodynamic consequences or cardiac symptoms.  Inpatient Medications    Scheduled Meds: . aspirin  81 mg Oral Daily  . Chlorhexidine Gluconate Cloth  6 each Topical Q0600  . Chlorhexidine Gluconate Cloth  6 each Topical Q0600  . darunavir  800 mg Oral Q supper  . dolutegravir  50 mg Oral Q supper  . enoxaparin (LOVENOX) injection  30 mg Subcutaneous Q24H  . gabapentin  100 mg Oral Daily  . metoprolol tartrate  12.5 mg Oral BID  . pantoprazole  40 mg Oral BID  . ritonavir  100 mg Oral Q supper  . rosuvastatin  5 mg Oral q1800  . sevelamer carbonate  1,600 mg Oral TID WC  . sodium chloride flush  3 mL Intravenous Q12H  . [START ON 05/30/2018] tenofovir  300 mg Oral Weekly   Continuous Infusions: . sodium chloride    . sodium chloride 20 mL/hr at 05/27/2018 0600   PRN Meds: sodium chloride, acetaminophen, ondansetron (ZOFRAN) IV, ondansetron (ZOFRAN) IV, oxyCODONE-acetaminophen, polyvinyl alcohol, sodium chloride flush   Vital Signs    Vitals:   05/27/18 2017 04/29/2018 0045 05/12/2018 0400 05/05/2018 0801  BP: (!) 84/55 (!) 92/45 (!) 115/46 (!) 80/41  Pulse: 70 65 66 70  Resp: 20 18 18 18   Temp: (!) 97.5 F (36.4 C) 98.2 F (36.8 C) 98 F (36.7 C) 97.6 F (36.4 C)  TempSrc: Oral Oral Oral Oral  SpO2: 100% 100% 100% 100%  Weight:      Height:        Intake/Output Summary (Last 24 hours) at 05/23/2018 0941 Last data filed at 05/19/2018 0600 Gross per 24 hour  Intake 478.53 ml  Output 649 ml  Net -170.47 ml   Filed Weights   05/27/18 0636 05/27/18 0740 05/27/18 1116  Weight: 66.5 kg 66.5 kg 65.8 kg    Telemetry    Normal sinus rhythm with occasional PVCs.- Personally Reviewed  ECG    No new tracing.-  Personally Reviewed  Physical Exam  Cachectic appearing. GEN: No acute distress.   Neck: No JVD Cardiac: RRR, rubs, or gallops.  Systolic and diastolic right upper sternal border murmur associated with aortic valve disease. Respiratory: Clear to auscultation bilaterally. GI: Soft, nontender, non-distended  MS: No edema; No deformity. Neuro:  Nonfocal  Psych: Normal affect   Labs    Chemistry Recent Labs  Lab 05/22/18 0642  05/25/18 0500 05/26/18 0431 05/27/18 0528  NA 139   < > 136 140 136  K 5.0   < > 5.4* 4.5 4.8  CL 102   < > 97* 100 99  CO2 24   < > 19* 26 21*  GLUCOSE 82   < > 126* 81 89  BUN 31*   < > 46* 29* 48*  CREATININE 9.00*   < > 9.74* 6.44* 7.98*  CALCIUM 9.6   < > 10.0 9.9 9.4  PROT 7.7  --   --   --   --   ALBUMIN 3.1*  --  3.0*  --   --   AST 14*  --   --   --   --   ALT 9  --   --   --   --  ALKPHOS 201*  --   --   --   --   BILITOT 2.6*  --   --   --   --   GFRNONAA 6*   < > 5* 9* 7*  GFRAA 7*   < > 6* 10* 8*  ANIONGAP 13   < > 20* 14 16*   < > = values in this interval not displayed.     Hematology Recent Labs  Lab 05/23/18 0248 05/15/2018 0650 05/27/18 0528  WBC 5.8 5.4 9.3  RBC 3.21* 3.01* 3.04*  HGB 10.8* 10.3* 10.9*  HCT 34.1* 32.5* 31.8*  MCV 106.2* 108.0* 104.6*  MCH 33.6 34.2* 35.9*  MCHC 31.7 31.7 34.3  RDW 17.6* 18.2* 18.5*  PLT 70* 85* 65*    Cardiac Enzymes Recent Labs  Lab 05/22/18 0043 05/22/18 0642 05/22/18 2036 05/23/18 0248  TROPONINI 1.35* 1.43* 1.07* 1.19*    Recent Labs  Lab 05/24/2018 1455  TROPIPOC 0.86*     BNP Recent Labs  Lab 05/14/2018 1907  BNP >4,500.0*     DDimer No results for input(s): DDIMER in the last 168 hours.   Radiology    No results found.  Cardiac Studies    Coronary angiography demonstrates occluded first and second diagonals without collaterals, diffusely diseased/occluded ramus intermedius, and high-grade heavily calcified proximal to mid LAD  obstruction.  Echocardiography raise the question of low flow low gradient severe aortic stenosis.  There is also at least moderate aortic regurgitation  Echo demonstrated recent deterioration in LV function with EF now 20 to 25%.  Patient Profile     52 y.o. 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  Severe pulmonary hypertension also noted.  Assessment & Plan    1. 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 2. Calcific coronary artery disease: High-grade obstruction proximal to mid LAD.  Successful therapy will likely require atherectomy.  I have alerted both Dr. Clifton James and Dr. Excell Seltzer to coordinate management with Dr. Cornelius Moras.  Patient tentatively on the schedule for Dr. Clifton James next Tuesday.  Operator could change based on conversation.  I am out of town next week. 3. New severe systolic and diastolic heart failure likely combination related to #1 and #2.  Volume status being maintained by dialysis. 4. End-stage CKD on chronic dialysis for greater than 10 years. 5. Positive HIV. 6. Moderate pulmonary hypertension: No clinical evidence of this post dialysis.  On the day of catheterization, dialysis have been performed 48 hours earlier.       For questions or updates, please contact [facility] Please consult [email or web address] for contact info under Cardiology/STEMI.      Signed, Lesleigh Noe, MD  05/10/2018, 9:41 AM

## 2018-05-29 ENCOUNTER — Encounter (HOSPITAL_COMMUNITY): Payer: Medicare Other

## 2018-05-29 ENCOUNTER — Inpatient Hospital Stay (HOSPITAL_COMMUNITY): Payer: Medicare Other

## 2018-05-29 DIAGNOSIS — I351 Nonrheumatic aortic (valve) insufficiency: Secondary | ICD-10-CM

## 2018-05-29 DIAGNOSIS — Z01810 Encounter for preprocedural cardiovascular examination: Secondary | ICD-10-CM

## 2018-05-29 LAB — RENAL FUNCTION PANEL
ALBUMIN: 2.9 g/dL — AB (ref 3.5–5.0)
Anion gap: 13 (ref 5–15)
BUN: 43 mg/dL — ABNORMAL HIGH (ref 6–20)
CALCIUM: 9.3 mg/dL (ref 8.9–10.3)
CHLORIDE: 101 mmol/L (ref 98–111)
CO2: 22 mmol/L (ref 22–32)
CREATININE: 6.75 mg/dL — AB (ref 0.61–1.24)
GFR, EST AFRICAN AMERICAN: 10 mL/min — AB (ref >60)
GFR, EST NON AFRICAN AMERICAN: 9 mL/min — AB (ref >60)
Glucose, Bld: 164 mg/dL — ABNORMAL HIGH (ref 70–99)
Phosphorus: 6.8 mg/dL — ABNORMAL HIGH (ref 2.5–4.6)
Potassium: 4.6 mmol/L (ref 3.5–5.1)
SODIUM: 136 mmol/L (ref 135–145)

## 2018-05-29 LAB — CBC
HCT: 34.9 % — ABNORMAL LOW (ref 39.0–52.0)
Hemoglobin: 10.9 g/dL — ABNORMAL LOW (ref 13.0–17.0)
MCH: 34.4 pg — ABNORMAL HIGH (ref 26.0–34.0)
MCHC: 31.2 g/dL (ref 30.0–36.0)
MCV: 110.1 fL — ABNORMAL HIGH (ref 78.0–100.0)
PLATELETS: 94 10*3/uL — AB (ref 150–400)
RBC: 3.17 MIL/uL — AB (ref 4.22–5.81)
RDW: 19.1 % — ABNORMAL HIGH (ref 11.5–15.5)
WBC: 6.2 10*3/uL (ref 4.0–10.5)

## 2018-05-29 MED ORDER — ACETAMINOPHEN 325 MG PO TABS
ORAL_TABLET | ORAL | Status: AC
  Filled 2018-05-29: qty 2

## 2018-05-29 NOTE — Progress Notes (Signed)
VASCULAR LAB PRELIMINARY  PRELIMINARY  PRELIMINARY  PRELIMINARY  Carotid duplex completed.    Preliminary report:  No significant ICA stenosis.  Vertebral artery flow is antegrade.   Cora Stetson, RVT 05/29/2018, 6:49 PM

## 2018-05-29 NOTE — Progress Notes (Signed)
PT Cancellation Note  Patient Details Name: [name] MRN: [id number] DOB: 06-16-1966   Cancelled Treatment:    Reason Eval/Treat Not Completed: PT screened, no needs identified, will sign off.  Patient reports he has been ambulating in hall with wife.  No balance issues.  Will sign off.   Olivia CanterMoton, Kanoelani Dobies M 05/29/2018, 1:19 PM

## 2018-05-29 NOTE — Progress Notes (Signed)
Progress Note  Patient Name: [name] Date of Encounter: 05/29/2018  Primary Cardiologist: Thurmon Fair, MD   Subjective   Denies angina or dyspnea. Yesterday's TEE showed severe degenerative changes involving the aortic valve leaflets with both moderate to severe aortic insufficiency and at least moderate aortic stenosis (low gradient due to depressed EF). Recommendation from Dr. Cornelius Moras is staged PCI to LAD and TAVR if amenable.  Inpatient Medications    Scheduled Meds: . aspirin  81 mg Oral Daily  . Chlorhexidine Gluconate Cloth  6 each Topical Q0600  . darunavir  800 mg Oral Q supper  . diphenhydrAMINE  50 mg Intravenous Once  . dolutegravir  50 mg Oral Q supper  . enoxaparin (LOVENOX) injection  30 mg Subcutaneous Q24H  . feeding supplement (PRO-STAT SUGAR FREE 64)  30 mL Oral BID  . gabapentin  100 mg Oral Daily  . metoprolol tartrate  12.5 mg Oral BID  . pantoprazole  40 mg Oral BID  . ritonavir  100 mg Oral Q supper  . rosuvastatin  5 mg Oral q1800  . sevelamer carbonate  1,600 mg Oral TID WC  . sodium chloride flush  3 mL Intravenous Q12H  . [START ON 05/30/2018] tenofovir  300 mg Oral Weekly   Continuous Infusions: . sodium chloride     PRN Meds: sodium chloride, acetaminophen, nitroGLYCERIN, ondansetron (ZOFRAN) IV, ondansetron (ZOFRAN) IV, oxyCODONE-acetaminophen, polyvinyl alcohol, sodium chloride flush   Vital Signs    Vitals:   05/29/18 1000 05/29/18 1030 05/29/18 1100 05/29/18 1300  BP: (!) 84/55 (!) 88/52 (!) 88/61 91/71  Pulse: 68 66 66 66  Resp:      Temp:      TempSrc:      SpO2:      Weight:      Height:        Intake/Output Summary (Last 24 hours) at 05/29/2018 1322 Last data filed at 05/29/2018 0657 Gross per 24 hour  Intake 674.5 ml  Output -  Net 674.5 ml   Filed Weights   05/27/18 1116 05/29/18 0500 05/29/18 0802  Weight: 65.8 kg 66.5 kg 68.1 kg    Telemetry    Sinus rhythm with occasional multiform PVCs-  Personally Reviewed  ECG    No new tracing - Personally Reviewed  Physical Exam  Very slender GEN: No acute distress.   Neck: No JVD Cardiac: RRR, 3/6 mid peaking aortic ejection murmur in the aortic focus, decrescendo 3/6 murmur at the right lower sternal border, no rubs or gallops.  Excellent thrill/bruit over the AV fistula in the right upper arm Respiratory: Clear to auscultation bilaterally. GI: Soft, nontender, non-distended  MS: No edema; No deformity. Neuro:  Nonfocal  Psych: Normal affect   Labs    Chemistry Recent Labs  Lab 05/25/18 0500 05/26/18 0431 05/27/18 0528 05/29/18 0840  NA 136 140 136 136  K 5.4* 4.5 4.8 4.6  CL 97* 100 99 101  CO2 19* 26 21* 22  GLUCOSE 126* 81 89 164*  BUN 46* 29* 48* 43*  CREATININE 9.74* 6.44* 7.98* 6.75*  CALCIUM 10.0 9.9 9.4 9.3  ALBUMIN 3.0*  --   --  2.9*  GFRNONAA 5* 9* 7* 9*  GFRAA 6* 10* 8* 10*  ANIONGAP 20* 14 16* 13     Hematology Recent Labs  Lab 05/18/2018 0650 05/27/18 0528 05/29/18 0840  WBC 5.4 9.3 6.2  RBC 3.01* 3.04* 3.17*  HGB 10.3* 10.9* 10.9*  HCT 32.5* 31.8* 34.9*  MCV 108.0* 104.6* 110.1*  MCH 34.2* 35.9* 34.4*  MCHC 31.7 34.3 31.2  RDW 18.2* 18.5* 19.1*  PLT 85* 65* 94*    Cardiac Enzymes Recent Labs  Lab 05/22/18 2036 05/23/18 0248  TROPONINI 1.07* 1.19*   No results for input(s): TROPIPOC in the last 168 hours.   BNPNo results for input(s): BNP, PROBNP in the last 168 hours.   DDimer No results for input(s): DDIMER in the last 168 hours.   Radiology    Ct Angio Chest Aorta W/cm &/or Wo/cm  Result Date: 05/29/2018 CLINICAL DATA:  52 year old male with severe symptomatic aortic stenosis EXAM: CT ANGIOGRAPHY CHEST AND ABDOMEN TECHNIQUE: Multidetector CT imaging of the chest and abdomen was performed using the standard protocol during bolus administration of intravenous contrast. Multiplanar CT image reconstructions and MIPs were obtained to evaluate the vascular anatomy. CONTRAST:   80mL ISOVUE-370 IOPAMIDOL (ISOVUE-370) INJECTION 76% COMPARISON:  Concurrently obtained but separately dictated CT scan of the heart; prior CT scan of the chest 10/15/2015; prior CT scan of the abdomen and pelvis 10/13/2015 FINDINGS: CTA CHEST FINDINGS Cardiovascular: Conventional 3 vessel arch anatomy. Mild atherosclerotic calcifications present along the course of the transverse and descending thoracic aorta. The ascending thoracic aorta remains normal in caliber with a maximal diameter of 3.1 cm. The main pulmonary artery is enlarged at 3 cm. No central pulmonary embolus is evident. The aortic valve is thickened and calcified. Cardiomegaly with left ventricular dilatation. Calcifications present along the left anterior descending coronary artery. No pericardial effusion. Mediastinum/Nodes: Unremarkable CT appearance of the thyroid gland. No soft tissue mediastinal mass. The thoracic esophagus is unremarkable. A low right paratracheal lymph node is borderline enlarged at 1.3 cm but unchanged compared to prior imaging from 2017. Similarly, there is a prominent high right paratracheal lymph node measuring up to 1 cm in short axis which may be slightly enlarged compared to prior. Prominent nodal tissue is present in the subcarinal station without discrete lymphadenopathy. Lungs/Pleura: Paraseptal pulmonary emphysema and mild centrilobular emphysema are noted in the lung apices. There is diffuse mild bronchial wall thickening throughout the lungs, most significant in the lower lobes. Subsegmental atelectasis is present diffusely. There may be mild interlobular septal thickening. Trace right pleural effusion. Musculoskeletal: No acute fracture or aggressive appearing lytic or blastic osseous lesion. Review of the MIP images confirms the above findings. CTA ABDOMEN FINDINGS Aorta: Normal caliber abdominal aorta with minimal atherosclerotic vascular calcifications. No dissection or aneurysm. Celiac: Widely patent at the  origin. No dissection or aneurysm. Beading is present along the right common hepatic artery and right proper hepatic artery consistent with fibromuscular dysplasia. The splenic artery is highly tortuous. SMA: Patent without evidence of aneurysm, dissection, vasculitis or significant stenosis. Renals: Severely diseased bilateral renal arteries which are atretic. Calcified plaque at the origin of the left renal artery results in high-grade stenosis. Small accessory renal artery on the right. IMA: Patent without evidence of aneurysm, dissection, vasculitis or significant stenosis. Inflow: Patent without evidence of aneurysm, dissection, vasculitis or significant stenosis. The vessels are minimally tortuous and robust in caliber. Veins: No focal venous abnormality. Hepatobiliary: Normal hepatic contour and morphology. No discrete hepatic lesion. Calcified stones are present within the gallbladder neck. No intra or extrahepatic biliary ductal dilatation. Pancreas: Unremarkable.  No inflammatory changes or mass. Spleen: No focal splenic lesion.  Borderline splenomegaly. Adrenals/Urinary Tract: Normal adrenal glands. 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 No hydronephrosis, nephrolithiasis or  enhancing renal mass. Stomach/Bowel: No focal bowel wall thickening or evidence of obstruction. Lymphatic: Borderline enlarged retroperitoneal lymph nodes measuring up to 1 cm in short axis. Musculoskeletal: No acute fracture or aggressive appearing lytic or blastic osseous lesion. Diffuse mild sclerosis of the osseous structures. Other: No abdominal wall hernia or ascites. Review of the MIP images confirms the above findings. IMPRESSION: VASCULAR 1. Normal caliber aorta without evidence of aneurysm, dissection or irregular/ulcerated atherosclerotic plaque. There is fairly mild calcified atherosclerotic plaque. Aortic Atherosclerosis  (ICD10-170.0). 2. Mildly enlarged main pulmonary artery as can be seen in the setting of pulmonary arterial hypertension. 3. Fibromuscular dysplasia involving the common and proper hepatic arteries. 4. Atrophic and heavily diseased renal arteries bilaterally. 5. Widely patent, minimally tortuous and robust common femoral and iliac arteries. 6. Cardiomegaly with left ventricular dilatation. 7. Thickened and calcified aortic valve which is more thoroughly evaluated on the dedicated CT scan of the heart. 8. Coronary artery disease with calcifications along the left anterior descending coronary artery. NON VASCULAR 1. Slight interval progression of prominent, borderline enlarged lymphadenopathy in the mediastinum and retroperitoneal nodal stations. While these may be reactive and secondary to chronic lymphatic congestion or inflammation, a developing low-grade lymphoproliferative process such as CLL would be difficult to exclude radiographically. 2. Mild combined paraseptal and centrilobular pulmonary emphysema. Emphysema (ICD10-J43.9). 3. Trace right pleural effusion. 4. Diffuse bronchial wall thickening bilaterally suggests chronic bronchitis. 5. Mild dependent interstitial pulmonary edema. 6. 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 7. Cholelithiasis. 8. 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 Signed, Sterling Big, MD, RPVI [facility] Electronically Signed   By: Malachy Moan M.D.   On: 05/29/2018 10:11   Ct Angio Abd/pel W/ And/or W/o  Result Date: 05/29/2018 CLINICAL DATA:  52 year old male with severe symptomatic aortic stenosis EXAM: CT ANGIOGRAPHY CHEST AND ABDOMEN TECHNIQUE: Multidetector CT imaging of the chest and abdomen was performed using the standard protocol during bolus administration  of intravenous contrast. Multiplanar CT image reconstructions and MIPs were obtained to evaluate the vascular anatomy. CONTRAST:  80mL ISOVUE-370 IOPAMIDOL (ISOVUE-370) INJECTION 76% COMPARISON:  Concurrently obtained but separately dictated CT scan of the heart; prior CT scan of the chest 10/15/2015; prior CT scan of the abdomen and pelvis 10/13/2015 FINDINGS: CTA CHEST FINDINGS Cardiovascular: Conventional 3 vessel arch anatomy. Mild atherosclerotic calcifications present along the course of the transverse and descending thoracic aorta. The ascending thoracic aorta remains normal in caliber with a maximal diameter of 3.1 cm. The main pulmonary artery is enlarged at 3 cm. No central pulmonary embolus is evident. The aortic valve is thickened and calcified. Cardiomegaly with left ventricular dilatation. Calcifications present along the left anterior descending coronary artery. No pericardial effusion. Mediastinum/Nodes: Unremarkable CT appearance of the thyroid gland. No soft tissue mediastinal mass. The thoracic esophagus is unremarkable. A low right paratracheal lymph node is borderline enlarged at 1.3 cm but unchanged compared to prior imaging from 2017. Similarly, there is a prominent high right paratracheal lymph node measuring up to 1 cm in short axis which may be slightly enlarged compared to prior. Prominent nodal tissue is present in the subcarinal station without discrete lymphadenopathy. Lungs/Pleura: Paraseptal pulmonary emphysema and mild centrilobular emphysema are noted in the lung apices. There is diffuse mild bronchial wall thickening throughout the lungs, most significant in the lower lobes. Subsegmental atelectasis is present diffusely. There may be mild interlobular septal  thickening. Trace right pleural effusion. Musculoskeletal: No acute fracture or aggressive appearing lytic or blastic osseous lesion. Review of the MIP images confirms the above findings. CTA ABDOMEN FINDINGS Aorta: Normal  caliber abdominal aorta with minimal atherosclerotic vascular calcifications. No dissection or aneurysm. Celiac: Widely patent at the origin. No dissection or aneurysm. Beading is present along the right common hepatic artery and right proper hepatic artery consistent with fibromuscular dysplasia. The splenic artery is highly tortuous. SMA: Patent without evidence of aneurysm, dissection, vasculitis or significant stenosis. Renals: Severely diseased bilateral renal arteries which are atretic. Calcified plaque at the origin of the left renal artery results in high-grade stenosis. Small accessory renal artery on the right. IMA: Patent without evidence of aneurysm, dissection, vasculitis or significant stenosis. Inflow: Patent without evidence of aneurysm, dissection, vasculitis or significant stenosis. The vessels are minimally tortuous and robust in caliber. Veins: No focal venous abnormality. Hepatobiliary: Normal hepatic contour and morphology. No discrete hepatic lesion. Calcified stones are present within the gallbladder neck. No intra or extrahepatic biliary ductal dilatation. Pancreas: Unremarkable.  No inflammatory changes or mass. Spleen: No focal splenic lesion.  Borderline splenomegaly. Adrenals/Urinary Tract: Normal adrenal glands. 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 No hydronephrosis, nephrolithiasis or enhancing renal mass. Stomach/Bowel: No focal bowel wall thickening or evidence of obstruction. Lymphatic: Borderline enlarged retroperitoneal lymph nodes measuring up to 1 cm in short axis. Musculoskeletal: No acute fracture or aggressive appearing lytic or blastic osseous lesion. Diffuse mild sclerosis of the osseous structures. Other: No abdominal wall hernia or ascites. Review of the MIP images confirms the above findings. IMPRESSION: VASCULAR 1. Normal caliber aorta without evidence of aneurysm, dissection  or irregular/ulcerated atherosclerotic plaque. There is fairly mild calcified atherosclerotic plaque. Aortic Atherosclerosis (ICD10-170.0). 2. Mildly enlarged main pulmonary artery as can be seen in the setting of pulmonary arterial hypertension. 3. Fibromuscular dysplasia involving the common and proper hepatic arteries. 4. Atrophic and heavily diseased renal arteries bilaterally. 5. Widely patent, minimally tortuous and robust common femoral and iliac arteries. 6. Cardiomegaly with left ventricular dilatation. 7. Thickened and calcified aortic valve which is more thoroughly evaluated on the dedicated CT scan of the heart. 8. Coronary artery disease with calcifications along the left anterior descending coronary artery. NON VASCULAR 1. Slight interval progression of prominent, borderline enlarged lymphadenopathy in the mediastinum and retroperitoneal nodal stations. While these may be reactive and secondary to chronic lymphatic congestion or inflammation, a developing low-grade lymphoproliferative process such as CLL would be difficult to exclude radiographically. 2. Mild combined paraseptal and centrilobular pulmonary emphysema. Emphysema (ICD10-J43.9). 3. Trace right pleural effusion. 4. Diffuse bronchial wall thickening bilaterally suggests chronic bronchitis. 5. Mild dependent interstitial pulmonary edema. 6. 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 7. Cholelithiasis. 8. 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 Signed, Sterling BigHeath K. McCullough, MD, RPVI [facility] Electronically Signed   By: Malachy MoanHeath  McCullough M.D.   On: 05/29/2018 10:11    Cardiac Studies   TEE 05/05/2018 - Left ventricle: The cavity size was moderately dilated. Systolic   function was severely reduced. The estimated ejection fraction   was  in the range of 15% to 20%. Wall motion was normal; there   were no regional wall motion abnormalities. - Aortic valve: Trileaflet; severely thickened, moderately   calcified leaflets; fusion of the left-noncoronary commissure.   Cusp  separation was reduced. Valve mobility was restricted. There   was moderate stenosis. There was moderate to severe   regurgitation. Peak velocity (S): 275 cm/s. Mean gradient (S): 19   mm Hg. Valve area (VTI): 1.2 cm^2. Valve area (Vmax): 1.58 cm^2.   Valve area (Vmean): 1.5 cm^2. Valve area by planimetry 0.79 cm^2. - Mitral valve: There was mild regurgitation. - Left atrium: No evidence of thrombus in the atrial cavity or   appendage. No evidence of thrombus in the atrial cavity or   appendage. - Right ventricle: The cavity size was normal. Wall thickness was   normal. Systolic function was severely reduced. - Right atrium: No evidence of thrombus in the atrial cavity or   appendage.  Impressions:  - Aortic stenosis is severe by planimetry but moderate by mean   gradient and peak velocity. Given the reduced systolic function   and severely calcified, consider low-flow, low-gradient severe   aortic stenosis.  04/29/2018 CATH   1st Diag lesion is 100% stenosed.  Prox LAD to Mid LAD lesion is 95% stenosed.  2nd Diag lesion is 99% stenosed.  1st Mrg lesion is 90% stenosed.  Prox RCA to Mid RCA lesion is 20% stenosed.  Mid RCA lesion is 20% stenosed.   Evidence for coronary calcification with total occlusion of an inferior branch of the first diagonal vessel, 95% LAD stenosis after the septal perforating artery and at the takeoff of a diffusely diseased second diagonal branch in a large LAD vessel which wraps around the apex; a ramus intermediate vessel which bifurcates into 2 branches which has 30% narrowing in the inferior branch and 90% narrowing in the mid superior branch; normal AV groove circumflex vessel; dominant RCA with 20% proximal and  20% mid stenoses with mid distal 95% stenosis in an anterior RV marginal branch (not able to be shown on the diagram).  Significant elevation right heart pressures with moderately severe pulmonary hypertension.  Low gradient at least moderate aortic valve stenosis with a mean gradient of 18 mm Hg, peak to peak gradient of 14 mm Hg, and peak instantaneous gradient of 28 mm.  Aortic valve area of 1.2 to 1.3 cm.  Previous echo Doppler ejection fraction 20 to 25% not assessed during this catheterization due to the markedly elevated LVEDP.  RECOMMENDATION: Patient will be undergoing dialysis with his volume overload.  Will review angiogram with colleagues.  Options include possible PCI to the LAD versus surgical assessment of candidacy of AVR/CABG.  Recommend Aspirin 81mg  daily for moderate CAD.  If surgical option is negative institute dual antiplatelet therapy with plan for LAD PCI.  Patient Profile     52 y.o. 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  Assessment & Plan    1. CHF: Recent onset systolic dysfunction; maintaining euvolemia with dialysis, which so far he has tolerated without severe hypotension.  Hopeful for improvement in myocardial function after aortic valve replacement and revascularization. 2. CAD: Heavily calcified high-grade proximal LAD obstruction will require atherectomy, scheduled for Tuesday at 1030 with Dr. Clifton James. 3. AS/AI: Doing by the report of his CT abdomen and pelvis, should have reasonably good femoral access for TAVR.  CT chest analysis in process. 4. ESRD: Logistical purposes we will ask our nephrology partners to perform dialysis on Monday instead of his usual Tuesday.  For questions or updates, please contact [facility] Please consult [email or web address] for contact info under Cardiology/STEMI.      Signed, Thurmon Fair, MD  05/29/2018, 1:22 PM

## 2018-05-29 NOTE — Progress Notes (Signed)
  [facility] Progress Note   Subjective:  Seen on HD.  Feels fine today.  Had TEE + CTA yesterday.   Objective Vitals:   05/04/2018 2150 05/29/18 0022 05/29/18 0426 05/29/18 0500  BP: 124/76 135/88 133/77   Pulse: 67 66 61   Resp:  16 16   Temp:  97.7 F (36.5 C) 97.6 F (36.4 C)   TempSrc:      SpO2:  (!) 88%    Weight:    66.5 kg  Height:       Physical Exam General: Well appearing man, NAD Heart: RRR; 3/6 systolic murmur Lungs: CTAB Extremities: trace LE edema - chronic Dialysis Access:  LUE AVG + thrill  Additional Objective Labs: Basic Metabolic Panel: Recent Labs  Lab 05/25/18 0500 05/26/18 0431 05/27/18 0528  NA 136 140 136  K 5.4* 4.5 4.8  CL 97* 100 99  CO2 19* 26 21*  GLUCOSE 126* 81 89  BUN 46* 29* 48*  CREATININE 9.74* 6.44* 7.98*  CALCIUM 10.0 9.9 9.4  PHOS 9.0*  --   --    Liver Function Tests: Recent Labs  Lab 05/25/18 0500  ALBUMIN 3.0*   CBC: Recent Labs  Lab 05/23/18 0248 05/28/2018 0650 05/27/18 0528  WBC 5.8 5.4 9.3  HGB 10.8* 10.3* 10.9*  HCT 34.1* 32.5* 31.8*  MCV 106.2* 108.0* 104.6*  PLT 70* 85* 65*   Cardiac Enzymes: Recent Labs  Lab 05/22/18 2036 05/23/18 0248  TROPONINI 1.07* 1.19*   Medications: . sodium chloride     . aspirin  81 mg Oral Daily  . Chlorhexidine Gluconate Cloth  6 each Topical Q0600  . darunavir  800 mg Oral Q supper  . diphenhydrAMINE  50 mg Intravenous Once  . dolutegravir  50 mg Oral Q supper  . enoxaparin (LOVENOX) injection  30 mg Subcutaneous Q24H  . feeding supplement (PRO-STAT SUGAR FREE 64)  30 mL Oral BID  . gabapentin  100 mg Oral Daily  . metoprolol tartrate  12.5 mg Oral BID  . pantoprazole  40 mg Oral BID  . ritonavir  100 mg Oral Q supper  . rosuvastatin  5 mg Oral q1800  . sevelamer carbonate  1,600 mg Oral TID WC  . sodium chloride flush  3 mL Intravenous Q12H  . [START ON 05/30/2018] tenofovir  300 mg Oral Weekly    [facility]  400/800 EDW 66kg 2K/2.5Ca Profile 4 Na Linear L AVG No heparin  -Hectorol 3 mcg IV TIW -Venofer 50mg  IV q weeks  -Mircera 100mcg IV q 2 weeks last 8/20  -Ca acetate 4 tid qac--> stopped while hospitalized in light of hyperCa  Assessment/Plan: 1. NSTEMI: Followed by [facility] showing severe CAD, HFrEF (EF 20-25% or lower), severe calcified AoV: Cardiology formulating plan. Per notes sounds like stents + TAVR if anatomy amenable.  2. ESRD: Will continue HD per TTS schedule. Will need EDW.  3. HTN/volume: BP low at times, on metoprolol. Getting well below prior EDW, will change on d/c. No edema. 4. Anemia: Hgb 10.9. Not due for ESA yet. 5. Secondary hyperparathyroidism: Corr Ca high on admit, improved with changing to non-Ca binder on 8/28 and changing to 2Ca bath. 6. Nutrition: Alb low. Pro-stat supps. 7. HIV: Continue home meds. 8. Thrombocytopenia: Chronic issue, stable.  Estill BakesLindsay Brailen Macneal MD 05/29/2018, 8:48 AM  [facility]

## 2018-05-29 NOTE — Progress Notes (Signed)
Family Medicine Teaching Service Daily Progress Note Intern Pager: [phone]  Patient name: [name] Medical record number: [id number] Date of birth: [birth year] Age: 52 y.o. Gender: male  Primary Care Provider: Patient, No Pcp Per Consultants: Cardiology Code Status: Full  Pt Overview and Major Events to Date:  8/23 Admit to FPTS for NSTEMI with TTE showing EF 20-25%, started on heparin and nitro drip 8/26 Cardiac cath 8/30 TEE, CTA  Assessment and Plan: Caleb Cianci Cunninghamis a 52 y.o.malepresenting with chest pain and feeling unwell found to have NSTEMI. PMH is significant forHIV, ESRD, HSV, anemia, peripheral neuropathy, anemia, GI bleed, PUD, AS.  Inferolateral NSTEMI:  Acute. ECHO 8/25 aortic stenosis, EF 20-25%, diffuse hypokinesis. Heart cath 8/26. TEE 8/30 LVEF 15%, moderate RV systolic dysfxn, severe aortic stenosis. CTA cardiomegaly, severe AS, CAD. Cards/CVTS following, plan for high risk PCI/TAVR 9/3. Poor surgical candidate. No chest pain this am. - f/u cards recs  - Continue aspirin 81 mg daily, Lopressor 12.5 mg twice daily, Crestor 5 mg daily - Telemetry and continuous pulse ox   ESRD on HD:  Chronic.  Stable. Currently receives HD on T/Th/Sa with LUE AVF.  - continue HD on current schedule. - Nephrology following, appreciate recs   Hypotension:  Had hypotensive episode to SBP 60s yesterday evening following premedication with benadryl for CTA. Resolved with IV hydration. BP this AM 133/77.  Has history of hypertension.  Has remained below EDW.  Is on dialysis. home med metoprolol 12.5 BID.  - Continue Lopressor 12.5 mg BID -monitor vitals and clinical symptomatolgy   HIV: Chronic.  Well-controlled.  Last CD4 780, viral load undetectable. - Continue tivicay and prezista  Post herpetic lesion - pain well controlled.  -continue home gabapentin 115m daily -monitor pain  HLD: Chronic Lipid Panel normal. LFTs showed AST 14, ALT 9, Alk phos 201 -  cont crestor 540mQD   HSV: Chronic, stable On Acyclovir prn.  No current outbreak. - consider acyclovir prn if symptoms arise during hospitalization   PUD: Stable Hx GI in 2018.  On protonix 4072mID at home. - cont home protonix - cont to monitor CBC  Anemia: Chronic, stable Likely related to CKD.  Baseline ~10.  Hgb 8/29 10.9. Awaiting morning labs.  - cont to monitor CBC  Peripheral Neuropathy: Chronic Symptoms unchanged. Last Vitamin B12 12/2016, 332. - cont to monitor   Pterygium OU: Chronic No change in symptoms,  has seen ophtho in the past. - cont artificial tears prn  Tobacco Abuse Current 0.5 ppd smoker. - encourage tobacco cessation - consider nicotine patch prn  FEN/GI: renal/fluid restricted PPx: Heparin   Disposition: continue inpatient management, awaiting PCI/TAVR  Subjective:  Patient seen on HD this am. Doing well, no concerns. No chest pain today.  Objective: Temp:  [97 F (36.1 C)-97.9 F (36.6 C)] 97.6 F (36.4 C) (08/31 0426) Pulse Rate:  [59-73] 61 (08/31 0426) Resp:  [12-32] 16 (08/31 0426) BP: (70-135)/(51-88) 133/77 (08/31 0426) SpO2:  [52 %-100 %] 88 % (08/31 0022) Weight:  [66.5 kg] 66.5 kg (08/31 0500)   Physical Exam: General: in NAD, in HD Cardiovascular: RRR, systolic murmur  Lungs: CTAB. Slight crackles at L base, appropriate saturations on RA.  Abdomen: soft, NTND. +BS Extremities: warm and well perfused, normal tone, no edema  Laboratory: Recent Labs  Lab 05/23/18 0248 05/20/2018 0650 05/27/18 0528  WBC 5.8 5.4 9.3  HGB 10.8* 10.3* 10.9*  HCT 34.1* 32.5* 31.8*  PLT 70* 85* 65*  Recent Labs  Lab 05/25/18 0500 05/26/18 0431 05/27/18 0528  NA 136 140 136  K 5.4* 4.5 4.8  CL 97* 100 99  CO2 19* 26 21*  BUN 46* 29* 48*  CREATININE 9.74* 6.44* 7.98*  CALCIUM 10.0 9.9 9.4  GLUCOSE 126* 81 89   Ct Angio Chest Aorta W/cm &/or Wo/cm  Result Date: 05/29/2018 CLINICAL DATA:  52 year old male with severe  symptomatic aortic stenosis EXAM: CT ANGIOGRAPHY CHEST AND ABDOMEN TECHNIQUE: Multidetector CT imaging of the chest and abdomen was performed using the standard protocol during bolus administration of intravenous contrast. Multiplanar CT image reconstructions and MIPs were obtained to evaluate the vascular anatomy. CONTRAST:  63m ISOVUE-370 IOPAMIDOL (ISOVUE-370) INJECTION 76% COMPARISON:  Concurrently obtained but separately dictated CT scan of the heart; prior CT scan of the chest 10/15/2015; prior CT scan of the abdomen and pelvis 10/13/2015 FINDINGS: CTA CHEST FINDINGS Cardiovascular: Conventional 3 vessel arch anatomy. Mild atherosclerotic calcifications present along the course of the transverse and descending thoracic aorta. The ascending thoracic aorta remains normal in caliber with a maximal diameter of 3.1 cm. The main pulmonary artery is enlarged at 3 cm. No central pulmonary embolus is evident. The aortic valve is thickened and calcified. Cardiomegaly with left ventricular dilatation. Calcifications present along the left anterior descending coronary artery. No pericardial effusion. Mediastinum/Nodes: Unremarkable CT appearance of the thyroid gland. No soft tissue mediastinal mass. The thoracic esophagus is unremarkable. A low right paratracheal lymph node is borderline enlarged at 1.3 cm but unchanged compared to prior imaging from 2017. Similarly, there is a prominent high right paratracheal lymph node measuring up to 1 cm in short axis which may be slightly enlarged compared to prior. Prominent nodal tissue is present in the subcarinal station without discrete lymphadenopathy. Lungs/Pleura: Paraseptal pulmonary emphysema and mild centrilobular emphysema are noted in the lung apices. There is diffuse mild bronchial wall thickening throughout the lungs, most significant in the lower lobes. Subsegmental atelectasis is present diffusely. There may be mild interlobular septal thickening. Trace right  pleural effusion. Musculoskeletal: No acute fracture or aggressive appearing lytic or blastic osseous lesion. Review of the MIP images confirms the above findings. CTA ABDOMEN FINDINGS Aorta: Normal caliber abdominal aorta with minimal atherosclerotic vascular calcifications. No dissection or aneurysm. Celiac: Widely patent at the origin. No dissection or aneurysm. Beading is present along the right common hepatic artery and right proper hepatic artery consistent with fibromuscular dysplasia. The splenic artery is highly tortuous. SMA: Patent without evidence of aneurysm, dissection, vasculitis or significant stenosis. Renals: Severely diseased bilateral renal arteries which are atretic. Calcified plaque at the origin of the left renal artery results in high-grade stenosis. Small accessory renal artery on the right. IMA: Patent without evidence of aneurysm, dissection, vasculitis or significant stenosis. Inflow: Patent without evidence of aneurysm, dissection, vasculitis or significant stenosis. The vessels are minimally tortuous and robust in caliber. Veins: No focal venous abnormality. Hepatobiliary: Normal hepatic contour and morphology. No discrete hepatic lesion. Calcified stones are present within the gallbladder neck. No intra or extrahepatic biliary ductal dilatation. Pancreas: Unremarkable.  No inflammatory changes or mass. Spleen: No focal splenic lesion.  Borderline splenomegaly. Adrenals/Urinary Tract: Normal adrenal glands. 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 No hydronephrosis, nephrolithiasis or enhancing renal mass. Stomach/Bowel: No focal bowel wall thickening or evidence of obstruction. Lymphatic: Borderline enlarged retroperitoneal lymph nodes measuring up to 1 cm in short axis. Musculoskeletal: No acute fracture or aggressive  appearing lytic or blastic osseous lesion. Diffuse mild sclerosis of the  osseous structures. Other: No abdominal wall hernia or ascites. Review of the MIP images confirms the above findings. IMPRESSION: VASCULAR 1. Normal caliber aorta without evidence of aneurysm, dissection or irregular/ulcerated atherosclerotic plaque. There is fairly mild calcified atherosclerotic plaque. Aortic Atherosclerosis (ICD10-170.0). 2. Mildly enlarged main pulmonary artery as can be seen in the setting of pulmonary arterial hypertension. 3. Fibromuscular dysplasia involving the common and proper hepatic arteries. 4. Atrophic and heavily diseased renal arteries bilaterally. 5. Widely patent, minimally tortuous and robust common femoral and iliac arteries. 6. Cardiomegaly with left ventricular dilatation. 7. Thickened and calcified aortic valve which is more thoroughly evaluated on the dedicated CT scan of the heart. 8. Coronary artery disease with calcifications along the left anterior descending coronary artery. NON VASCULAR 1. Slight interval progression of prominent, borderline enlarged lymphadenopathy in the mediastinum and retroperitoneal nodal stations. While these may be reactive and secondary to chronic lymphatic congestion or inflammation, a developing low-grade lymphoproliferative process such as CLL would be difficult to exclude radiographically. 2. Mild combined paraseptal and centrilobular pulmonary emphysema. Emphysema (ICD10-J43.9). 3. Trace right pleural effusion. 4. Diffuse bronchial wall thickening bilaterally suggests chronic bronchitis. 5. Mild dependent interstitial pulmonary edema. 6. 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 7. Cholelithiasis. 8. 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 Signed, Criselda Peaches, MD, [place] [facility] Radiology Electronically Signed   By: Jacqulynn Cadet M.D.    On: 05/29/2018 10:11   Ct Angio Abd/pel W/ And/or W/o  Result Date: 05/29/2018 CLINICAL DATA:  52 year old male with severe symptomatic aortic stenosis EXAM: CT ANGIOGRAPHY CHEST AND ABDOMEN TECHNIQUE: Multidetector CT imaging of the chest and abdomen was performed using the standard protocol during bolus administration of intravenous contrast. Multiplanar CT image reconstructions and MIPs were obtained to evaluate the vascular anatomy. CONTRAST:  30m ISOVUE-370 IOPAMIDOL (ISOVUE-370) INJECTION 76% COMPARISON:  Concurrently obtained but separately dictated CT scan of the heart; prior CT scan of the chest 10/15/2015; prior CT scan of the abdomen and pelvis 10/13/2015 FINDINGS: CTA CHEST FINDINGS Cardiovascular: Conventional 3 vessel arch anatomy. Mild atherosclerotic calcifications present along the course of the transverse and descending thoracic aorta. The ascending thoracic aorta remains normal in caliber with a maximal diameter of 3.1 cm. The main pulmonary artery is enlarged at 3 cm. No central pulmonary embolus is evident. The aortic valve is thickened and calcified. Cardiomegaly with left ventricular dilatation. Calcifications present along the left anterior descending coronary artery. No pericardial effusion. Mediastinum/Nodes: Unremarkable CT appearance of the thyroid gland. No soft tissue mediastinal mass. The thoracic esophagus is unremarkable. A low right paratracheal lymph node is borderline enlarged at 1.3 cm but unchanged compared to prior imaging from 2017. Similarly, there is a prominent high right paratracheal lymph node measuring up to 1 cm in short axis which may be slightly enlarged compared to prior. Prominent nodal tissue is present in the subcarinal station without discrete lymphadenopathy. Lungs/Pleura: Paraseptal pulmonary emphysema and mild centrilobular emphysema are noted in the lung apices. There is diffuse mild bronchial wall thickening throughout the lungs, most significant in  the lower lobes. Subsegmental atelectasis is present diffusely. There may be mild interlobular septal thickening. Trace right pleural effusion. Musculoskeletal: No acute fracture or aggressive appearing lytic or blastic osseous lesion. Review of the MIP images confirms the above findings. CTA ABDOMEN FINDINGS Aorta: Normal caliber  abdominal aorta with minimal atherosclerotic vascular calcifications. No dissection or aneurysm. Celiac: Widely patent at the origin. No dissection or aneurysm. Beading is present along the right common hepatic artery and right proper hepatic artery consistent with fibromuscular dysplasia. The splenic artery is highly tortuous. SMA: Patent without evidence of aneurysm, dissection, vasculitis or significant stenosis. Renals: Severely diseased bilateral renal arteries which are atretic. Calcified plaque at the origin of the left renal artery results in high-grade stenosis. Small accessory renal artery on the right. IMA: Patent without evidence of aneurysm, dissection, vasculitis or significant stenosis. Inflow: Patent without evidence of aneurysm, dissection, vasculitis or significant stenosis. The vessels are minimally tortuous and robust in caliber. Veins: No focal venous abnormality. Hepatobiliary: Normal hepatic contour and morphology. No discrete hepatic lesion. Calcified stones are present within the gallbladder neck. No intra or extrahepatic biliary ductal dilatation. Pancreas: Unremarkable.  No inflammatory changes or mass. Spleen: No focal splenic lesion.  Borderline splenomegaly. Adrenals/Urinary Tract: Normal adrenal glands. 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 No hydronephrosis, nephrolithiasis or enhancing renal mass. Stomach/Bowel: No focal bowel wall thickening or evidence of obstruction. Lymphatic: Borderline enlarged retroperitoneal lymph nodes measuring up to 1 cm in short axis.  Musculoskeletal: No acute fracture or aggressive appearing lytic or blastic osseous lesion. Diffuse mild sclerosis of the osseous structures. Other: No abdominal wall hernia or ascites. Review of the MIP images confirms the above findings. IMPRESSION: VASCULAR 1. Normal caliber aorta without evidence of aneurysm, dissection or irregular/ulcerated atherosclerotic plaque. There is fairly mild calcified atherosclerotic plaque. Aortic Atherosclerosis (ICD10-170.0). 2. Mildly enlarged main pulmonary artery as can be seen in the setting of pulmonary arterial hypertension. 3. Fibromuscular dysplasia involving the common and proper hepatic arteries. 4. Atrophic and heavily diseased renal arteries bilaterally. 5. Widely patent, minimally tortuous and robust common femoral and iliac arteries. 6. Cardiomegaly with left ventricular dilatation. 7. Thickened and calcified aortic valve which is more thoroughly evaluated on the dedicated CT scan of the heart. 8. Coronary artery disease with calcifications along the left anterior descending coronary artery. NON VASCULAR 1. Slight interval progression of prominent, borderline enlarged lymphadenopathy in the mediastinum and retroperitoneal nodal stations. While these may be reactive and secondary to chronic lymphatic congestion or inflammation, a developing low-grade lymphoproliferative process such as CLL would be difficult to exclude radiographically. 2. Mild combined paraseptal and centrilobular pulmonary emphysema. Emphysema (ICD10-J43.9). 3. Trace right pleural effusion. 4. Diffuse bronchial wall thickening bilaterally suggests chronic bronchitis. 5. Mild dependent interstitial pulmonary edema. 6. 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 7. Cholelithiasis. 8. 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 Signed, Criselda Peaches, MD, [place]  [facility] Radiology Electronically Signed   By: Jacqulynn Cadet M.D.   On: 05/29/2018 10:11   Rory Percy, DO 05/29/2018, 8:24 AM PGY-2, [place] Intern pager: [phone], text pages welcome

## 2018-05-30 ENCOUNTER — Encounter (HOSPITAL_COMMUNITY): Payer: Self-pay | Admitting: Cardiovascular Disease

## 2018-05-30 MED ORDER — CHLORHEXIDINE GLUCONATE CLOTH 2 % EX PADS: 6.0000 | MEDICATED_PAD | Freq: Every day | CUTANEOUS | Status: DC

## 2018-05-30 NOTE — Progress Notes (Signed)
Family Medicine Teaching Service Daily Progress Note Intern Pager: [phone]  Patient name: [name] Medical record number: [id number] Date of birth: 1965-10-29 Age: 52 y.o. Gender: male  Primary Care Provider: Patient, No Pcp Per Consultants: Cards Code Status: Full   Pt Overview and Major Events to Date:  8/23 Admit to FPTS for NSTEMI with TTE showing EF 20-25%, started on heparin and nitro drip 8/26 Cardiac cath 8/30 TEE, CTA  Assessment and Plan: Caleb Ellena Cunninghamis a 52 y.o.malepresenting with chest pain and feeling unwell found to have NSTEMI. PMH is significant forHIV, ESRD, HSV, anemia, peripheral neuropathy, anemia, GI bleed, PUD, AS.  Inferolateral NSTEMI:  Acute. Denies CP or SOB this am. S/p Heart cath on 8/26. TEE 8/30 LVEF 15%, moderate RV systolic dysfxn, severe aortic stenosis. CTA 8/30 cardiomegaly, severe AS, CAD. Cards/CVTS following, plan for high risk PCI on 9/3, with consideration for TAVR at that time as well.  - F/u cards recs  - Continue aspirin 81 mg daily, Lopressor 12.5 mg twice daily, Crestor 5 mg daily - Telemetry and continuous pulse ox   ESRD on HD:  Chronic. Stable. Currently receives HD on T/Th/Sa with LUE AVF.  - continue HD on current schedule - Nephrology following, appreciate recs   Hypertension: Stable. Normotensive overnight. - Continue home Lopressor 12.5 mg BID -monitor vitals and clinical symptomatolgy   Borderline Lymphadenopathy: Borderline enlarged in mediastinum (1.3/1cm) and retroperitoneal nodes (1cm) on CT 8/30, similar to previous CT in 2017. Thought to be likely 2/2 chronic lymphatic congestion or inflammation, however could not r/o CLL via imaging. No intervention at this time given stability and no further signs or symptoms concerning for CLL. Wbc wnl, will obtain differential in the am. -CBC with diff in am   HIV: Chronic. Well-controlled.  Last CD4 780, viral load undetectable. - Continue tivicay and  prezista  Post herpetic lesion: No complaints of pain this am.  -continue home gabapentin 100mg  daily -monitor pain  HLD: Chronic - cont crestor 5mg  QD   HSV: Chronic, stable On Acyclovir prn. No current outbreak. - consider acyclovir prn if symptoms arise during hospitalization   PUD: Stable On protonix 40mg  BID at home. - cont home protonix - cont to monitor CBC  Anemia: Chronic, stable Likely related to CKD. Hgb 9/1 10.9, at baseline.  - cont to monitor CBC  Peripheral Neuropathy: Chronic Symptoms unchanged. Last Vitamin B12 12/2016, 332. - cont to monitor   Pterygium OU: Chronic No change in symptoms, has seen ophtho in the past. - cont artificial tears prn  Tobacco Abuse Current 0.5 ppd smoker. - encourage tobacco cessation - consider nicotine patch prn  FEN/GI: renal/fluid restricted PPx: Heparin   Disposition: Continued inpatient care, planning for PCI 9/3   Subjective:  No acute events overnight. No complaints this am. Denies SOB, CP, palpitations, or abdominal pain.   Objective: Temp:  [97.2 F (36.2 C)-98.4 F (36.9 C)] 97.7 F (36.5 C) (09/01 0744) Pulse Rate:  [59-96] 76 (09/01 0744) Resp:  [16-18] 18 (09/01 0744) BP: (84-148)/(38-137) 98/55 (09/01 0744) SpO2:  [87 %-100 %] 100 % (09/01 0744) Weight:  [70.1 kg] 70.1 kg (09/01 0500) Physical Exam: General: Alert, NAD HEENT: NCAT, MMM, oropharynx nonerythematous  Cardiac: RRR, grade 2-3 systolic murmur   Lungs: Clear bilaterally, no increased WOB  Abdomen: soft, non-tender, non-distended, normoactive BS Msk: Moves all extremities spontaneously  Ext: Warm, dry, 2+ distal pulses, trace edema to BLE     Laboratory: Recent Labs  Lab 05/23/2018  3546 05/27/18 0528 05/29/18 0840  WBC 5.4 9.3 6.2  HGB 10.3* 10.9* 10.9*  HCT 32.5* 31.8* 34.9*  PLT 85* 65* 94*   Recent Labs  Lab 05/26/18 0431 05/27/18 0528 05/29/18 0840  NA 140 136 136  K 4.5 4.8 4.6  CL 100 99 101  CO2 26 21*  22  BUN 29* 48* 43*  CREATININE 6.44* 7.98* 6.75*  CALCIUM 9.9 9.4 9.3  GLUCOSE 81 89 164*     Imaging/Diagnostic Tests: No results found.  Allayne Stack, DO 05/30/2018, 8:27 AM PGY-1, [place] [facility] FPTS Intern pager: [phone], text pages welcome

## 2018-05-30 NOTE — Progress Notes (Signed)
[facility] Progress Note   Subjective:  Tol HD yesterday and stayed the whole time -- took APAP at midpoint and that helped shoulder pain enough that he could stay on.   Objective Vitals:   05/29/18 2013 05/30/18 0016 05/30/18 0500 05/30/18 0744  BP: 138/69 117/69 107/65 (!) 98/55  Pulse: 77 78 75 76  Resp: 18 18 18 18   Temp: 97.7 F (36.5 C) 98.4 F (36.9 C) 98 F (36.7 C) 97.7 F (36.5 C)  TempSrc: Oral Oral Oral Oral  SpO2:  96% 98% 100%  Weight:   70.1 kg   Height:       Physical Exam General: Well appearing man, NAD Heart: RRR; 3/6 systolic murmur Lungs: CTAB Extremities: trace LE edema - chronic Dialysis Access:  LUE AVG + thrill  Additional Objective Labs: Basic Metabolic Panel: Recent Labs  Lab 05/25/18 0500 05/26/18 0431 05/27/18 0528 05/29/18 0840  NA 136 140 136 136  K 5.4* 4.5 4.8 4.6  CL 97* 100 99 101  CO2 19* 26 21* 22  GLUCOSE 126* 81 89 164*  BUN 46* 29* 48* 43*  CREATININE 9.74* 6.44* 7.98* 6.75*  CALCIUM 10.0 9.9 9.4 9.3  PHOS 9.0*  --   --  6.8*   Liver Function Tests: Recent Labs  Lab 05/25/18 0500 05/29/18 0840  ALBUMIN 3.0* 2.9*   CBC: Recent Labs  Lab 05/15/2018 0650 05/27/18 0528 05/29/18 0840  WBC 5.4 9.3 6.2  HGB 10.3* 10.9* 10.9*  HCT 32.5* 31.8* 34.9*  MCV 108.0* 104.6* 110.1*  PLT 85* 65* 94*   Cardiac Enzymes: No results for input(s): CKTOTAL, CKMB, CKMBINDEX, TROPONINI in the last 168 hours. Medications: . sodium chloride     . aspirin  81 mg Oral Daily  . Chlorhexidine Gluconate Cloth  6 each Topical Q0600  . darunavir  800 mg Oral Q supper  . diphenhydrAMINE  50 mg Intravenous Once  . dolutegravir  50 mg Oral Q supper  . enoxaparin (LOVENOX) injection  30 mg Subcutaneous Q24H  . feeding supplement (PRO-STAT SUGAR FREE 64)  30 mL Oral BID  . gabapentin  100 mg Oral Daily  . metoprolol tartrate  12.5 mg Oral BID  . pantoprazole  40 mg Oral BID  . ritonavir  100 mg Oral Q supper  .  rosuvastatin  5 mg Oral q1800  . sevelamer carbonate  1,600 mg Oral TID WC  . sodium chloride flush  3 mL Intravenous Q12H  . tenofovir  300 mg Oral Weekly    Dialysis Orders: TTS at [facility] 3.5h 400/800 EDW 66kg 2K/2.5Ca Profile 4 Na Linear L AVG No heparin  -Hectorol 3 mcg IV TIW -Venofer 50mg  IV q weeks  -Mircera IV q 2 weeks last 8/20  -Ca acetate 4 tid qac--> stopped while hospitalized in light of hyperCa  Assessment/Plan: 1. NSTEMI: Followed by [facility] showing severe CAD, HFrEF (EF 20-25% or lower), severe calcified AoV: Cardiology formulating plan. Per notes sounds like PCI + TAVR if anatomy amenable.  2. ESRD: Usually TTS, but [facility] planned for Tuesday so HD Monday to facilitate.  3. HTN/volume: BP low at times, on metoprolol. Challenging EDW as tolerated.  4. Anemia: Hgb 10.9. Not due for ESA yet.  Would be due 9/3.  5. Secondary hyperparathyroidism: Corr Ca high on admit, improved with changing to non-Ca binder on 8/28 and changing to 2Ca bath. Phos improved from 9 to 6.8 after changing to renvela.  CTM.  6. Nutrition: Alb low. Pro-stat  supps. 7. HIV: Continue home meds. 8. Thrombocytopenia: Chronic issue, stable.  Estill Bakes MD 05/30/2018, 8:48 AM  [facility]

## 2018-05-30 NOTE — Progress Notes (Signed)
Progress Note  Patient Name: [name] Date of Encounter: 05/30/2018  Primary Cardiologist: Thurmon Fair, MD   Subjective   Denies angina or shortness of breath at rest.  Inpatient Medications    Scheduled Meds: . aspirin  81 mg Oral Daily  . Chlorhexidine Gluconate Cloth  6 each Topical Q0600  . darunavir  800 mg Oral Q supper  . diphenhydrAMINE  50 mg Intravenous Once  . dolutegravir  50 mg Oral Q supper  . enoxaparin (LOVENOX) injection  30 mg Subcutaneous Q24H  . feeding supplement (PRO-STAT SUGAR FREE 64)  30 mL Oral BID  . gabapentin  100 mg Oral Daily  . metoprolol tartrate  12.5 mg Oral BID  . pantoprazole  40 mg Oral BID  . ritonavir  100 mg Oral Q supper  . rosuvastatin  5 mg Oral q1800  . sevelamer carbonate  1,600 mg Oral TID WC  . sodium chloride flush  3 mL Intravenous Q12H  . tenofovir  300 mg Oral Weekly   Continuous Infusions: . sodium chloride     PRN Meds: sodium chloride, acetaminophen, nitroGLYCERIN, ondansetron (ZOFRAN) IV, ondansetron (ZOFRAN) IV, oxyCODONE-acetaminophen, polyvinyl alcohol, sodium chloride flush   Vital Signs    Vitals:   05/29/18 2013 05/30/18 0016 05/30/18 0500 05/30/18 0744  BP: 138/69 117/69 107/65 (!) 98/55  Pulse: 77 78 75 76  Resp: 18 18 18 18   Temp: 97.7 F (36.5 C) 98.4 F (36.9 C) 98 F (36.7 C) 97.7 F (36.5 C)  TempSrc: Oral Oral Oral Oral  SpO2:  96% 98% 100%  Weight:   70.1 kg   Height:        Intake/Output Summary (Last 24 hours) at 05/30/2018 0929 Last data filed at 05/30/2018 4098 Gross per 24 hour  Intake 603 ml  Output 1806 ml  Net -1203 ml   Filed Weights   05/29/18 0500 05/29/18 0802 05/30/18 0500  Weight: 66.5 kg 68.1 kg 70.1 kg    Telemetry    Sinus rhythm occasional PVCs- Personally Reviewed  ECG    No new tracing- Personally Reviewed  Physical Exam  Very thin, but not really cachectic GEN: No acute distress.   Neck: No JVD Cardiac: RRR, prominent early peaking  systolic ejection murmur and a decrescendo diastolic murmur in the aortic focus and left lower sternal border, no rubs, or gallops.  Respiratory: Clear to auscultation bilaterally. GI: Soft, nontender, non-distended  MS: No edema; No deformity. Neuro:  Nonfocal  Psych: Normal affect   Labs    Chemistry Recent Labs  Lab 05/25/18 0500 05/26/18 0431 05/27/18 0528 05/29/18 0840  NA 136 140 136 136  K 5.4* 4.5 4.8 4.6  CL 97* 100 99 101  CO2 19* 26 21* 22  GLUCOSE 126* 81 89 164*  BUN 46* 29* 48* 43*  CREATININE 9.74* 6.44* 7.98* 6.75*  CALCIUM 10.0 9.9 9.4 9.3  ALBUMIN 3.0*  --   --  2.9*  GFRNONAA 5* 9* 7* 9*  GFRAA 6* 10* 8* 10*  ANIONGAP 20* 14 16* 13     Hematology Recent Labs  Lab 05/08/2018 0650 05/27/18 0528 05/29/18 0840  WBC 5.4 9.3 6.2  RBC 3.01* 3.04* 3.17*  HGB 10.3* 10.9* 10.9*  HCT 32.5* 31.8* 34.9*  MCV 108.0* 104.6* 110.1*  MCH 34.2* 35.9* 34.4*  MCHC 31.7 34.3 31.2  RDW 18.2* 18.5* 19.1*  PLT 85* 65* 94*    Cardiac EnzymesNo results for input(s): TROPONINI in the last 168 hours. No  results for input(s): TROPIPOC in the last 168 hours.   BNPNo results for input(s): BNP, PROBNP in the last 168 hours.   DDimer No results for input(s): DDIMER in the last 168 hours.   Radiology    Ct Angio Chest Aorta W/cm &/or Wo/cm  Result Date: 05/29/2018 CLINICAL DATA:  52 year old male with severe symptomatic aortic stenosis EXAM: CT ANGIOGRAPHY CHEST AND ABDOMEN TECHNIQUE: Multidetector CT imaging of the chest and abdomen was performed using the standard protocol during bolus administration of intravenous contrast. Multiplanar CT image reconstructions and MIPs were obtained to evaluate the vascular anatomy. CONTRAST:  80mL ISOVUE-370 IOPAMIDOL (ISOVUE-370) INJECTION 76% COMPARISON:  Concurrently obtained but separately dictated CT scan of the heart; prior CT scan of the chest 10/15/2015; prior CT scan of the abdomen and pelvis 10/13/2015 FINDINGS: CTA CHEST  FINDINGS Cardiovascular: Conventional 3 vessel arch anatomy. Mild atherosclerotic calcifications present along the course of the transverse and descending thoracic aorta. The ascending thoracic aorta remains normal in caliber with a maximal diameter of 3.1 cm. The main pulmonary artery is enlarged at 3 cm. No central pulmonary embolus is evident. The aortic valve is thickened and calcified. Cardiomegaly with left ventricular dilatation. Calcifications present along the left anterior descending coronary artery. No pericardial effusion. Mediastinum/Nodes: Unremarkable CT appearance of the thyroid gland. No soft tissue mediastinal mass. The thoracic esophagus is unremarkable. A low right paratracheal lymph node is borderline enlarged at 1.3 cm but unchanged compared to prior imaging from 2017. Similarly, there is a prominent high right paratracheal lymph node measuring up to 1 cm in short axis which may be slightly enlarged compared to prior. Prominent nodal tissue is present in the subcarinal station without discrete lymphadenopathy. Lungs/Pleura: Paraseptal pulmonary emphysema and mild centrilobular emphysema are noted in the lung apices. There is diffuse mild bronchial wall thickening throughout the lungs, most significant in the lower lobes. Subsegmental atelectasis is present diffusely. There may be mild interlobular septal thickening. Trace right pleural effusion. Musculoskeletal: No acute fracture or aggressive appearing lytic or blastic osseous lesion. Review of the MIP images confirms the above findings. CTA ABDOMEN FINDINGS Aorta: Normal caliber abdominal aorta with minimal atherosclerotic vascular calcifications. No dissection or aneurysm. Celiac: Widely patent at the origin. No dissection or aneurysm. Beading is present along the right common hepatic artery and right proper hepatic artery consistent with fibromuscular dysplasia. The splenic artery is highly tortuous. SMA: Patent without evidence of  aneurysm, dissection, vasculitis or significant stenosis. Renals: Severely diseased bilateral renal arteries which are atretic. Calcified plaque at the origin of the left renal artery results in high-grade stenosis. Small accessory renal artery on the right. IMA: Patent without evidence of aneurysm, dissection, vasculitis or significant stenosis. Inflow: Patent without evidence of aneurysm, dissection, vasculitis or significant stenosis. The vessels are minimally tortuous and robust in caliber. Veins: No focal venous abnormality. Hepatobiliary: Normal hepatic contour and morphology. No discrete hepatic lesion. Calcified stones are present within the gallbladder neck. No intra or extrahepatic biliary ductal dilatation. Pancreas: Unremarkable.  No inflammatory changes or mass. Spleen: No focal splenic lesion.  Borderline splenomegaly. Adrenals/Urinary Tract: Normal adrenal glands. 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 No hydronephrosis, nephrolithiasis or enhancing renal mass. Stomach/Bowel: No focal bowel wall thickening or evidence of obstruction. Lymphatic: Borderline enlarged retroperitoneal lymph nodes measuring up to 1 cm in short axis. Musculoskeletal: No acute fracture or aggressive appearing lytic or blastic osseous lesion. Diffuse mild sclerosis of the  osseous structures. Other: No abdominal wall hernia or ascites. Review of the MIP images confirms the above findings. IMPRESSION: VASCULAR 1. Normal caliber aorta without evidence of aneurysm, dissection or irregular/ulcerated atherosclerotic plaque. There is fairly mild calcified atherosclerotic plaque. Aortic Atherosclerosis (ICD10-170.0). 2. Mildly enlarged main pulmonary artery as can be seen in the setting of pulmonary arterial hypertension. 3. Fibromuscular dysplasia involving the common and proper hepatic arteries. 4. Atrophic and heavily diseased renal arteries  bilaterally. 5. Widely patent, minimally tortuous and robust common femoral and iliac arteries. 6. Cardiomegaly with left ventricular dilatation. 7. Thickened and calcified aortic valve which is more thoroughly evaluated on the dedicated CT scan of the heart. 8. Coronary artery disease with calcifications along the left anterior descending coronary artery. NON VASCULAR 1. Slight interval progression of prominent, borderline enlarged lymphadenopathy in the mediastinum and retroperitoneal nodal stations. While these may be reactive and secondary to chronic lymphatic congestion or inflammation, a developing low-grade lymphoproliferative process such as CLL would be difficult to exclude radiographically. 2. Mild combined paraseptal and centrilobular pulmonary emphysema. Emphysema (ICD10-J43.9). 3. Trace right pleural effusion. 4. Diffuse bronchial wall thickening bilaterally suggests chronic bronchitis. 5. Mild dependent interstitial pulmonary edema. 6. 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 7. Cholelithiasis. 8. 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 Signed, Sterling Big, MD, RPVI [facility] Radiology Electronically Signed   By: Malachy Moan M.D.   On: 05/29/2018 10:11   Ct Angio Abd/pel W/ And/or W/o  Result Date: 05/29/2018 CLINICAL DATA:  52 year old male with severe symptomatic aortic stenosis EXAM: CT ANGIOGRAPHY CHEST AND ABDOMEN TECHNIQUE: Multidetector CT imaging of the chest and abdomen was performed using the standard protocol during bolus administration of intravenous contrast. Multiplanar CT image reconstructions and MIPs were obtained to evaluate the vascular anatomy. CONTRAST:  39mL ISOVUE-370 IOPAMIDOL (ISOVUE-370) INJECTION 76% COMPARISON:  Concurrently obtained but separately dictated CT  scan of the heart; prior CT scan of the chest 10/15/2015; prior CT scan of the abdomen and pelvis 10/13/2015 FINDINGS: CTA CHEST FINDINGS Cardiovascular: Conventional 3 vessel arch anatomy. Mild atherosclerotic calcifications present along the course of the transverse and descending thoracic aorta. The ascending thoracic aorta remains normal in caliber with a maximal diameter of 3.1 cm. The main pulmonary artery is enlarged at 3 cm. No central pulmonary embolus is evident. The aortic valve is thickened and calcified. Cardiomegaly with left ventricular dilatation. Calcifications present along the left anterior descending coronary artery. No pericardial effusion. Mediastinum/Nodes: Unremarkable CT appearance of the thyroid gland. No soft tissue mediastinal mass. The thoracic esophagus is unremarkable. A low right paratracheal lymph node is borderline enlarged at 1.3 cm but unchanged compared to prior imaging from 2017. Similarly, there is a prominent high right paratracheal lymph node measuring up to 1 cm in short axis which may be slightly enlarged compared to prior. Prominent nodal tissue is present in the subcarinal station without discrete lymphadenopathy. Lungs/Pleura: Paraseptal pulmonary emphysema and mild centrilobular emphysema are noted in the lung apices. There is diffuse mild bronchial wall thickening throughout the lungs, most significant in the lower lobes. Subsegmental atelectasis is present diffusely. There may be mild interlobular septal thickening. Trace right pleural effusion. Musculoskeletal: No acute fracture or aggressive appearing lytic or blastic osseous lesion. Review of the MIP images confirms the above findings. CTA ABDOMEN FINDINGS Aorta: Normal caliber abdominal aorta with minimal atherosclerotic vascular calcifications. No dissection or aneurysm. Celiac:  Widely patent at the origin. No dissection or aneurysm. Beading is present along the right common hepatic artery and right proper  hepatic artery consistent with fibromuscular dysplasia. The splenic artery is highly tortuous. SMA: Patent without evidence of aneurysm, dissection, vasculitis or significant stenosis. Renals: Severely diseased bilateral renal arteries which are atretic. Calcified plaque at the origin of the left renal artery results in high-grade stenosis. Small accessory renal artery on the right. IMA: Patent without evidence of aneurysm, dissection, vasculitis or significant stenosis. Inflow: Patent without evidence of aneurysm, dissection, vasculitis or significant stenosis. The vessels are minimally tortuous and robust in caliber. Veins: No focal venous abnormality. Hepatobiliary: Normal hepatic contour and morphology. No discrete hepatic lesion. Calcified stones are present within the gallbladder neck. No intra or extrahepatic biliary ductal dilatation. Pancreas: Unremarkable.  No inflammatory changes or mass. Spleen: No focal splenic lesion.  Borderline splenomegaly. Adrenals/Urinary Tract: Normal adrenal glands. 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 No hydronephrosis, nephrolithiasis or enhancing renal mass. Stomach/Bowel: No focal bowel wall thickening or evidence of obstruction. Lymphatic: Borderline enlarged retroperitoneal lymph nodes measuring up to 1 cm in short axis. Musculoskeletal: No acute fracture or aggressive appearing lytic or blastic osseous lesion. Diffuse mild sclerosis of the osseous structures. Other: No abdominal wall hernia or ascites. Review of the MIP images confirms the above findings. IMPRESSION: VASCULAR 1. Normal caliber aorta without evidence of aneurysm, dissection or irregular/ulcerated atherosclerotic plaque. There is fairly mild calcified atherosclerotic plaque. Aortic Atherosclerosis (ICD10-170.0). 2. Mildly enlarged main pulmonary artery as can be seen in the setting of pulmonary arterial  hypertension. 3. Fibromuscular dysplasia involving the common and proper hepatic arteries. 4. Atrophic and heavily diseased renal arteries bilaterally. 5. Widely patent, minimally tortuous and robust common femoral and iliac arteries. 6. Cardiomegaly with left ventricular dilatation. 7. Thickened and calcified aortic valve which is more thoroughly evaluated on the dedicated CT scan of the heart. 8. Coronary artery disease with calcifications along the left anterior descending coronary artery. NON VASCULAR 1. Slight interval progression of prominent, borderline enlarged lymphadenopathy in the mediastinum and retroperitoneal nodal stations. While these may be reactive and secondary to chronic lymphatic congestion or inflammation, a developing low-grade lymphoproliferative process such as CLL would be difficult to exclude radiographically. 2. Mild combined paraseptal and centrilobular pulmonary emphysema. Emphysema (ICD10-J43.9). 3. Trace right pleural effusion. 4. Diffuse bronchial wall thickening bilaterally suggests chronic bronchitis. 5. Mild dependent interstitial pulmonary edema. 6. 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 7. Cholelithiasis. 8. 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 Signed, Sterling Big, MD, RPVI [facility] Radiology Electronically Signed   By: Malachy Moan M.D.   On: 05/29/2018 10:11    Cardiac Studies   TEE 05/25/2018 - Left ventricle: The cavity size was moderately dilated. Systolic function was severely reduced. The estimated ejection fraction was in the range of 15% to 20%. Wall motion was normal; there were no regional wall motion abnormalities. - Aortic valve: Trileaflet; severely thickened, moderately calcified leaflets; fusion of the left-noncoronary  commissure. Cusp separation was reduced. Valve mobility was restricted. There was moderate stenosis. There was moderate to severe regurgitation. Peak velocity (S): 275 cm/s. Mean gradient (S): 19 mm Hg. Valve area (VTI): 1.2 cm^2. Valve area (Vmax): 1.58 cm^2. Valve area (Vmean): 1.5 cm^2. Valve area by planimetry 0.79 cm^2. - Mitral valve: There was  mild regurgitation. - Left atrium: No evidence of thrombus in the atrial cavity or appendage. No evidence of thrombus in the atrial cavity or appendage. - Right ventricle: The cavity size was normal. Wall thickness was normal. Systolic function was severely reduced. - Right atrium: No evidence of thrombus in the atrial cavity or appendage.  Impressions:  - Aortic stenosis is severe by planimetry but moderate by mean gradient and peak velocity. Given the reduced systolic function and severely calcified, consider low-flow, low-gradient severe aortic stenosis.  05/03/2018 CATH   1st Diag lesion is 100% stenosed.  Prox LAD to Mid LAD lesion is 95% stenosed.  2nd Diag lesion is 99% stenosed.  1st Mrg lesion is 90% stenosed.  Prox RCA to Mid RCA lesion is 20% stenosed.  Mid RCA lesion is 20% stenosed.  Evidence for coronary calcification with total occlusion of an inferior branch of the first diagonal vessel, 95% LAD stenosis after the septal perforating artery and at the takeoff of a diffusely diseased second diagonal branch in a large LAD vessel which wraps around the apex; a ramus intermediate vessel which bifurcates into 2 branches which has 30% narrowing in the inferior branch and 90% narrowing in the mid superior branch; normal AV groove circumflex vessel; dominant RCA with 20% proximal and 20% mid stenoses with mid distal 95% stenosis in an anterior RV marginal branch (not able to be shown on the diagram).  Significant elevation right heart pressures with moderately severe pulmonary  hypertension.  Low gradient at least moderate aortic valve stenosis with a mean gradient of 18 mm Hg, peak to peak gradient of 14 mm Hg, and peak instantaneous gradient of 28 mm. Aortic valve area of 1.2 to 1.3 cm.  Previous echo Doppler ejection fraction 20 to 25% not assessed during this catheterization due to the markedly elevated LVEDP.  RECOMMENDATION: Patient will be undergoing dialysis with his volume overload. Will review angiogram with colleagues. Options include possible PCI to the LAD versus surgical assessment of candidacy of AVR/CABG.  Recommend Aspirin 81mg  daily for moderate CAD.If surgical option is negative institute dual antiplatelet therapy with plan for LAD PCI.  Patient Profile     52 y.o. 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  Assessment & Plan    1. CHF:  Appears clinically euvolemic.  Recent onset systolic dysfunction; maintaining euvolemia with dialysis, which so far he has tolerated without severe hypotension.  Hopeful for improvement in myocardial function after aortic valve replacement and revascularization. 2. CAD:  No angina.  Heavily calcified high-grade proximal LAD obstruction will require atherectomy, scheduled for Tuesday at 1030 with Dr. Clifton James. 3. 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  CT chest analysis in process. 4. ESRD:  Appreciate our Nephrology partners adjustment in his dialysis schedule     For questions or updates, please contact [facility] Please consult [email or web address] for contact info under Cardiology/STEMI.      Signed, Thurmon Fair, MD  05/30/2018, 9:29 AM

## 2018-05-30 DEATH — deceased

## 2018-05-31 LAB — CBC WITH DIFFERENTIAL/PLATELET
BASOS PCT: 0 %
Basophils Absolute: 0 10*3/uL (ref 0.0–0.1)
EOS ABS: 0.1 10*3/uL (ref 0.0–0.7)
Eosinophils Relative: 1 %
HCT: 35.4 % — ABNORMAL LOW (ref 39.0–52.0)
HEMOGLOBIN: 11.2 g/dL — AB (ref 13.0–17.0)
LYMPHS PCT: 35 %
Lymphs Abs: 2.6 10*3/uL (ref 0.7–4.0)
MCH: 34.9 pg — AB (ref 26.0–34.0)
MCHC: 31.6 g/dL (ref 30.0–36.0)
MCV: 110.3 fL — ABNORMAL HIGH (ref 78.0–100.0)
Monocytes Absolute: 1.3 10*3/uL — ABNORMAL HIGH (ref 0.1–1.0)
Monocytes Relative: 18 %
NEUTROS PCT: 46 %
Neutro Abs: 3.3 10*3/uL (ref 1.7–7.7)
PLATELETS: 92 10*3/uL — AB (ref 150–400)
RBC: 3.21 MIL/uL — AB (ref 4.22–5.81)
RDW: 18.4 % — ABNORMAL HIGH (ref 11.5–15.5)
WBC: 7.3 10*3/uL (ref 4.0–10.5)

## 2018-05-31 LAB — BASIC METABOLIC PANEL
ANION GAP: 13 (ref 5–15)
BUN: 43 mg/dL — AB (ref 6–20)
CHLORIDE: 99 mmol/L (ref 98–111)
CO2: 27 mmol/L (ref 22–32)
Calcium: 9.6 mg/dL (ref 8.9–10.3)
Creatinine, Ser: 6.98 mg/dL — ABNORMAL HIGH (ref 0.61–1.24)
GFR, EST AFRICAN AMERICAN: 10 mL/min — AB (ref >60)
GFR, EST NON AFRICAN AMERICAN: 8 mL/min — AB (ref >60)
Glucose, Bld: 87 mg/dL (ref 70–99)
POTASSIUM: 3.5 mmol/L (ref 3.5–5.1)
SODIUM: 139 mmol/L (ref 135–145)

## 2018-05-31 MED ORDER — LIDOCAINE HCL (PF) 1 % IJ SOLN: 5.0000 mL | INTRAMUSCULAR | Status: DC | PRN

## 2018-05-31 MED ORDER — LIDOCAINE-PRILOCAINE 2.5-2.5 % EX CREA
1.0000 "application " | TOPICAL_CREAM | CUTANEOUS | Status: DC | PRN
  Filled 2018-05-31: qty 5

## 2018-05-31 MED ORDER — HEPARIN SODIUM (PORCINE) 1000 UNIT/ML DIALYSIS
1000.0000 [IU] | INTRAMUSCULAR | Status: DC | PRN
  Filled 2018-05-31: qty 1

## 2018-05-31 MED ORDER — CLOPIDOGREL BISULFATE 75 MG PO TABS
75.0000 mg | ORAL_TABLET | Freq: Every day | ORAL | Status: DC
  Administered 2018-06-01 – 2018-06-16 (×15): 75 mg via ORAL
  Filled 2018-05-31 (×15): qty 1

## 2018-05-31 MED ORDER — SODIUM CHLORIDE 0.9% IV SOLUTION: Freq: Once | INTRAVENOUS | Status: DC

## 2018-05-31 MED ORDER — PENTAFLUOROPROP-TETRAFLUOROETH EX AERO: 1.0000 "application " | INHALATION_SPRAY | CUTANEOUS | Status: DC | PRN

## 2018-05-31 MED ORDER — SODIUM CHLORIDE 0.9% FLUSH
3.0000 mL | Freq: Two times a day (BID) | INTRAVENOUS | Status: DC
  Administered 2018-05-31: 3 mL via INTRAVENOUS

## 2018-05-31 MED ORDER — ALTEPLASE 2 MG IJ SOLR: 2.0000 mg | Freq: Once | INTRAMUSCULAR | Status: DC | PRN

## 2018-05-31 MED ORDER — SODIUM CHLORIDE 0.9 % IV SOLN: 100.0000 mL | INTRAVENOUS | Status: DC | PRN

## 2018-05-31 MED ORDER — SODIUM CHLORIDE 0.9 % IV SOLN: 250.0000 mL | INTRAVENOUS | Status: DC | PRN

## 2018-05-31 MED ORDER — SODIUM CHLORIDE 0.9% FLUSH: 3.0000 mL | INTRAVENOUS | Status: DC | PRN

## 2018-05-31 MED ORDER — SODIUM CHLORIDE 0.9 % IV SOLN: INTRAVENOUS | Status: DC

## 2018-05-31 MED ORDER — CLOPIDOGREL BISULFATE 300 MG PO TABS
300.0000 mg | ORAL_TABLET | Freq: Once | ORAL | Status: AC
  Administered 2018-05-31: 300 mg via ORAL
  Filled 2018-05-31: qty 1

## 2018-05-31 MED ORDER — ASPIRIN 81 MG PO CHEW
81.0000 mg | CHEWABLE_TABLET | ORAL | Status: AC
  Administered 2018-06-01: 81 mg via ORAL
  Filled 2018-05-31: qty 1

## 2018-05-31 NOTE — Progress Notes (Signed)
Progress Note  Patient Name: [name] Date of Encounter: 05/31/2018  Primary Cardiologist: Thurmon Fair, MD   Subjective   Denies angina or dyspnea.  Excellent appetite.  Going to dialysis today.  Inpatient Medications    Scheduled Meds: . aspirin  81 mg Oral Daily  . darunavir  800 mg Oral Q supper  . diphenhydrAMINE  50 mg Intravenous Once  . dolutegravir  50 mg Oral Q supper  . enoxaparin (LOVENOX) injection  30 mg Subcutaneous Q24H  . feeding supplement (PRO-STAT SUGAR FREE 64)  30 mL Oral BID  . gabapentin  100 mg Oral Daily  . metoprolol tartrate  12.5 mg Oral BID  . pantoprazole  40 mg Oral BID  . ritonavir  100 mg Oral Q supper  . rosuvastatin  5 mg Oral q1800  . sevelamer carbonate  1,600 mg Oral TID WC  . sodium chloride flush  3 mL Intravenous Q12H  . tenofovir  300 mg Oral Weekly   Continuous Infusions: . sodium chloride    . sodium chloride    . sodium chloride     PRN Meds: sodium chloride, sodium chloride, sodium chloride, acetaminophen, alteplase, heparin, lidocaine (PF), lidocaine-prilocaine, nitroGLYCERIN, ondansetron (ZOFRAN) IV, ondansetron (ZOFRAN) IV, oxyCODONE-acetaminophen, pentafluoroprop-tetrafluoroeth, polyvinyl alcohol, sodium chloride flush   Vital Signs    Vitals:   05/30/18 2230 05/30/18 2353 05/31/18 0505 05/31/18 0744  BP:  (!) 100/49 (!) 117/41 121/79  Pulse: 67 69 62 (!) 58  Resp:    20  Temp:  97.7 F (36.5 C) 97.6 F (36.4 C)   TempSrc:  Oral Oral Oral  SpO2:  99% 100% 100%  Weight:   69.3 kg   Height:        Intake/Output Summary (Last 24 hours) at 05/31/2018 0809 Last data filed at 05/31/2018 0653 Gross per 24 hour  Intake 1422 ml  Output 0 ml  Net 1422 ml   Filed Weights   05/29/18 0802 05/30/18 0500 05/31/18 0505  Weight: 68.1 kg 70.1 kg 69.3 kg    Telemetry    NSR, 1st degree AV block, PVCs - Personally Reviewed  ECG    No new tracing - Personally Reviewed  Physical Exam  Thin, but not  frankly cachectic GEN: No acute distress.   Neck: No JVD Cardiac: RRR, 3/6 mid peaking aortic ejection murmur in the aortic focus, decrescendo 3/6 murmur at the right lower sternal border, no rubs or gallops.  Excellent thrill/bruit over the AV fistula in the right upper arm Respiratory: Clear to auscultation bilaterally. GI: Soft, nontender, non-distended  MS: No edema; No deformity. Neuro:  Nonfocal  Psych: Normal affect   Labs    Chemistry Recent Labs  Lab 05/25/18 0500  05/27/18 0528 05/29/18 0840 05/31/18 0457  NA 136   < > 136 136 139  K 5.4*   < > 4.8 4.6 3.5  CL 97*   < > 99 101 99  CO2 19*   < > 21* 22 27  GLUCOSE 126*   < > 89 164* 87  BUN 46*   < > 48* 43* 43*  CREATININE 9.74*   < > 7.98* 6.75* 6.98*  CALCIUM 10.0   < > 9.4 9.3 9.6  ALBUMIN 3.0*  --   --  2.9*  --   GFRNONAA 5*   < > 7* 9* 8*  GFRAA 6*   < > 8* 10* 10*  ANIONGAP 20*   < > 16* 13 13   < > =  values in this interval not displayed.     Hematology Recent Labs  Lab 05/27/18 0528 05/29/18 0840 05/31/18 0457  WBC 9.3 6.2 7.3  RBC 3.04* 3.17* 3.21*  HGB 10.9* 10.9* 11.2*  HCT 31.8* 34.9* 35.4*  MCV 104.6* 110.1* 110.3*  MCH 35.9* 34.4* 34.9*  MCHC 34.3 31.2 31.6  RDW 18.5* 19.1* 18.4*  PLT 65* 94* 92*    Cardiac EnzymesNo results for input(s): TROPONINI in the last 168 hours. No results for input(s): TROPIPOC in the last 168 hours.   BNPNo results for input(s): BNP, PROBNP in the last 168 hours.   DDimer No results for input(s): DDIMER in the last 168 hours.   Radiology    No results found.  Cardiac Studies   TEE 05/27/2018 - Left ventricle: The cavity size was moderately dilated. Systolic function was severely reduced. The estimated ejection fraction was in the range of 15% to 20%. Wall motion was normal; there were no regional wall motion abnormalities. - Aortic valve: Trileaflet; severely thickened, moderately calcified leaflets; fusion of the left-noncoronary  commissure. Cusp separation was reduced. Valve mobility was restricted. There was moderate stenosis. There was moderate to severe regurgitation. Peak velocity (S): 275 cm/s. Mean gradient (S): 19 mm Hg. Valve area (VTI): 1.2 cm^2. Valve area (Vmax): 1.58 cm^2. Valve area (Vmean): 1.5 cm^2. Valve area by planimetry 0.79 cm^2. - Mitral valve: There was mild regurgitation. - Left atrium: No evidence of thrombus in the atrial cavity or appendage. No evidence of thrombus in the atrial cavity or appendage. - Right ventricle: The cavity size was normal. Wall thickness was normal. Systolic function was severely reduced. - Right atrium: No evidence of thrombus in the atrial cavity or appendage.  Impressions:  - Aortic stenosis is severe by planimetry but moderate by mean gradient and peak velocity. Given the reduced systolic function and severely calcified, consider low-flow, low-gradient severe aortic stenosis.  05/13/2018 CATH   1st Diag lesion is 100% stenosed.  Prox LAD to Mid LAD lesion is 95% stenosed.  2nd Diag lesion is 99% stenosed.  1st Mrg lesion is 90% stenosed.  Prox RCA to Mid RCA lesion is 20% stenosed.  Mid RCA lesion is 20% stenosed.  Evidence for coronary calcification with total occlusion of an inferior branch of the first diagonal vessel, 95% LAD stenosis after the septal perforating artery and at the takeoff of a diffusely diseased second diagonal branch in a large LAD vessel which wraps around the apex; a ramus intermediate vessel which bifurcates into 2 branches which has 30% narrowing in the inferior branch and 90% narrowing in the mid superior branch; normal AV groove circumflex vessel; dominant RCA with 20% proximal and 20% mid stenoses with mid distal 95% stenosis in an anterior RV marginal branch (not able to be shown on the diagram).  Significant elevation right heart pressures with moderately severe pulmonary  hypertension.  Low gradient at least moderate aortic valve stenosis with a mean gradient of 18 mm Hg, peak to peak gradient of 14 mm Hg, and peak instantaneous gradient of 28 mm. Aortic valve area of 1.2 to 1.3 cm.  Previous echo Doppler ejection fraction 20 to 25% not assessed during this catheterization due to the markedly elevated LVEDP.  RECOMMENDATION: Patient will be undergoing dialysis with his volume overload. Will review angiogram with colleagues. Options include possible PCI to the LAD versus surgical assessment of candidacy of AVR/CABG.  Recommend Aspirin 81mg  daily for moderate CAD.If surgical option is negative institute dual antiplatelet therapy  with plan for LAD PCI.  Patient Profile     52 y.o. 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  Assessment & Plan    1. CHF: Recent onset systolic dysfunction; maintaining euvolemia with dialysis, which so far he has tolerated without severe hypotension.  Hopeful for improvement in myocardial function after aortic valve replacement and revascularization. 2. CAD: Heavily calcified high-grade proximal LAD obstruction will require atherectomy, scheduled for tomorrow at 1030 with Dr. Clifton James. 3. 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  CT chest analysis in process. 4. ESRD:  Hemodialysis this morning.  For questions or updates, please contact [facility] Please consult [email or web address] for contact info under Cardiology/STEMI.      Signed, Thurmon Fair, MD  05/31/2018, 8:09 AM

## 2018-05-31 NOTE — Discharge Summary (Signed)
Family Medicine Teaching Service [facility] Death Summary  Patient name: [name] Medical record number: [id number] Date of birth: 05/14/1966 Age: 52 y.o. Gender: male Date of Admission: 06-20-18  Date of Death: 2018-07-17 Admitting Physician: Tobey Grim, MD  Primary Care Provider: Patient, No Pcp Per Consultants: Cards, CTVS, nephrology   Indication for Hospitalization: NSTEMI   Problem List:  NSTEMI s/p PCI of LAD  CAD Moderate aortic stenosis/mod-severe regurg  ESRD on hemodialysis T/Th/Sat Hypertension HIV, well controlled Post-herpetic lesions PUD Anemia of chronic disease Peripheral neuropathy Pterygium Tobacco use  Borderline mediastinal lymphadenopathy  Brief Hospital Course:  Mr. Hepp is 52 year old man, with a past history significant for ERSD on hemodialysis and well-controlled HIV, that presented on Jun 21, 2023 with exertional chest pain and dyspnea found to have an inferolateral NSTEMI. He was started on a heparin and nitroglycerin drip. Heart cath performed on 8/26 showing multiple vessel disease. TEE showing EF 15-20% without wall motion abnormalities, and moderate aortic stenosis (w/ mod-severe regurg). He was medically managed with low dose BB, statin, and aspirin, however not able to fully maximize given variable hypotension. He was not a surgical candidate for CABG or conventional valve replacement for his aortic valve following consultation with cardiothoracic surgery, and ultimately underwent a successful high risk PCI for his LAD on 9/3.   Due to poor social situation and complex medical history, he remained in the hospital for HD and with intention of performing TAVR on 9/10. On Saturday, 06/05/2018 the patient was scheduled for HD in the evening. As he was not called for dialysis until closer to dinner time, he felt it was too late in the day for HD and refused to go. The following day 2023/07/07) the patient's condition suddenly deteriorated and he coded  on the evening of 07/07/2023, and again the morning of 9/9 around 5am.   He was transferred to Critical Care Management, intubated and placed on pressors for cardiovascular instability.  Temporary pacemaker, Dialysis Catheter, and Arterial catheters were placed 2018-07-07. The patient coded again on July 07, 2018 around 07:30am. The patient's condition only slightly improved throughout his stay with CCM. He was made DNR and was weaned off Levophed, before returning to the [facility] Medicine Teaching Service on 07-17-2018.   On the morning of July 07, 2018 around 7:30am the patient was notified to be "breathing strangely" by family. He was in agonal breathing and was pulseless by palpation and doppler. No resuscitation was performed and the patient passed away.    Significant Procedures:  06-23-18 - Heart Cath 05/25/2018 - TEE, CTA  06/01/2018 - PTCA of the mid LAD with orbital atherectomy and stenting of the mid LAD  July 07, 2018 - Temporary pacemaker, Dialysis Catheter, and Arterial catheters    Significant Labs and Imaging:  Recent Labs  Lab 06/15/18 0356  06/16/18 0506  06/16/18 1010 06/16/18 1016 17-Jul-2018 0612  WBC 11.5*  --  6.9  --   --   --  10.2  HGB 8.8*  7.7*   < > 7.5*   < > 8.2* 7.8* 9.7*  HCT 26.0*  24.4*   < > 24.3*   < > 24.0* 23.0* 31.3*  PLT 73*  --  74*  --   --   --  74*   < > = values in this interval not displayed.   Recent Labs  Lab 06/14/18 1600  06/15/18 0356  06/15/18 1540  06/16/18 0506 06/16/18 0604 06/16/18 0809 06/16/18 0818 06/16/18 1010 06/16/18 1016  NA 139   < >  139  139   < > 141   < > 141 140 140 140 140 140  K 4.0   < > 3.4*  3.5   < > 3.4*   < > 3.2* 3.3* 3.5 3.1* 3.3* 3.2*  CL 100   < > 90*  92*   < > 91*   < > 88* 87* 87* 89* 87* 90*  CO2 24  --  29  --  35*  --  35*  --   --   --   --   --   GLUCOSE 150*   < > 177*  157*   < > 124*   < > 130* 198* 204* 124* 216* 145*  BUN 14   < > 11  12   < > 9   < > 10 7 8 10 6 9   CREATININE 2.04*   < > 1.50*  1.86*    < > 1.80*   < > 1.82* 1.00 1.10 1.40* 1.10 1.50*  CALCIUM 8.1*  --  9.5  --  9.6  --  10.0  --   --   --   --   --   MG  --   --  2.2  --   --   --  2.0  --   --   --   --   --   PHOS 2.9  --  2.6  --  2.2*  --  2.0*  --   --   --   --   --   ALBUMIN 1.9*  --  2.0*  --  1.9*  --  1.9*  --   --   --   --   --    < > = values in this interval not displayed.    Peggyann Shoals, DO [facility], PGY-1 06/21/2018 6:42 AM

## 2018-05-31 NOTE — Plan of Care (Signed)
  Problem: Education: Goal: Knowledge of General Education information will improve Description Including pain rating scale, medication(s)/side effects and non-pharmacologic comfort measures Outcome: Progressing   Problem: Health Behavior/Discharge Planning: Goal: Ability to manage health-related needs will improve Outcome: Progressing   Problem: Clinical Measurements: Goal: Ability to maintain clinical measurements within normal limits will improve Outcome: Progressing Goal: Will remain free from infection Outcome: Progressing Goal: Respiratory complications will improve Outcome: Progressing Goal: Cardiovascular complication will be avoided Outcome: Progressing   Problem: Activity: Goal: Risk for activity intolerance will decrease Outcome: Progressing   Problem: Nutrition: Goal: Adequate nutrition will be maintained Outcome: Progressing   Problem: Coping: Goal: Level of anxiety will decrease Outcome: Progressing   Problem: Elimination: Goal: Will not experience complications related to bowel motility Outcome: Progressing Goal: Will not experience complications related to urinary retention Outcome: Progressing   Problem: Safety: Goal: Ability to remain free from injury will improve Outcome: Progressing   Problem: Skin Integrity: Goal: Risk for impaired skin integrity will decrease Outcome: Progressing   Problem: Spiritual Needs Goal: Ability to function at adequate level Outcome: Progressing

## 2018-05-31 NOTE — Progress Notes (Signed)
Family Medicine Teaching Service Daily Progress Note Intern Pager: [phone]  Patient name: [name] Medical record number: [id number] Date of birth: Mar 25, 1966 Age: 52 y.o. Gender: male  Primary Care Provider: Patient, No Pcp Per Consultants: Cards Code Status: Full  Pt Overview and Major Events to Date:  8/23 Admit to FPTS for NSTEMI with TTE showing EF 20-25%, started on heparin and nitro drip 8/26 Cardiac cath 8/30 TEE, CTA  Assessment and Plan: [name] a 52 y.o.malepresenting with chest pain and feeling unwell found to have NSTEMI. PMH is significant forHIV, ESRD, HSV, anemia, peripheral neuropathy, anemia, GI bleed, PUD, AS.  Inferolateral NSTEMI: Denies CP or SOB this am. S/p heart cath on 8/26, severe CAD. CTA 8/30 severe AS.  Cards/CVTS following, undergoingPCI on 9/3 (tomorrow), with likely TAVR at a later date.  - F/u cards recs  - Continue aspirin 81 mg daily, Lopressor 12.5 mg twice daily, Crestor 5 mg daily - Telemetry and continuous pulse ox   ESRD on HD: Chronic. Stable. Currently receives HD on T/Th/Sa with LUE AVF.  -continue HD on current schedule; will be receiving HD today d/t procedure on 9/3  - Nephrology following, appreciate recs   Hypertension:Stable. Normotensive overnight, with slightly soft blood pressure in 100's systolic. - Continue home Lopressor 12.5 mg BID -monitor vitals and clinical symptomatolgy   Borderline Lymphadenopathy: Borderline enlarged in mediastinum (1.3/1cm) and retroperitoneal nodes (1cm) on CT 8/30, similar to previous CT in 2017. Thought to be likely 2/2 chronic lymphatic congestion or inflammation, however could not r/o CLL via imaging. No intervention at this time given stability and no further signs or symptoms concerning for CLL.  -No intervention -WBC with diff wnl   HIV: Chronic. Well-controlled. Last CD4 780, viral load undetectable. - Continue tivicay and prezista  Post  herpetic lesion: No complaints of pain this am.  -continuehomegabapentin 100mg  daily -monitor pain  HLD: Chronic - cont crestor 5mg  QD   HSV: Chronic, stable On Acyclovir prn. No current outbreak. - consider acyclovir prn if symptoms arise during hospitalization   PUD: Stable On protonix 40mg  BID at home. - cont home protonix - cont to monitor CBC  Anemia: Chronic, stable Likely related to CKD. Hgb11.2 from 10.9, at baseline.  - cont to monitor CBC  Peripheral Neuropathy: Chronic Symptoms unchanged. Last Vitamin B12 12/2016, 332. - cont to monitor   Pterygium OU: Chronic No change in symptoms, has seen ophtho in the past. - cont artificial tears prn  Tobacco Abuse Current 0.5 ppd smoker. - encourage tobacco cessation - consider nicotine patch prn  FEN/GI: renal/fluid restricted PPx: Heparin   Disposition: Continued care, PCI tomorrow 9/3  Subjective:  No acute events overnight, doing well this am eating breakfast. Does complain of some mild lower back pain from sleeping in the hospital bed. Has been trying to move frequently to help.   Objective: Temp:  [97.6 F (36.4 C)-97.9 F (36.6 C)] 97.6 F (36.4 C) (09/02 0505) Pulse Rate:  [62-77] 62 (09/02 0505) Resp:  [18-19] 19 (09/01 1100) BP: (92-120)/(41-79) 117/41 (09/02 0505) SpO2:  [99 %-100 %] 100 % (09/02 0505) Weight:  [69.3 kg] 69.3 kg (09/02 0505) Physical Exam: General: Alert, NAD, sitting up eating breakfast  HEENT: NCAT, MMM Cardiac: RRR 2-3 grade systolic murmur  Lungs: Clear bilaterally, no increased WOB  Abdomen: soft, non-tender, non-distended, normoactive BS Msk: Moves all extremities spontaneously  Ext: Warm, dry, 2+ distal pulses, 1+ LE edema    Laboratory: Recent Labs  Lab  05/27/18 0528 05/29/18 0840 05/31/18 0457  WBC 9.3 6.2 7.3  HGB 10.9* 10.9* 11.2*  HCT 31.8* 34.9* 35.4*  PLT 65* 94* 92*   Recent Labs  Lab 05/27/18 0528 05/29/18 0840 05/31/18 0457  NA 136  136 139  K 4.8 4.6 3.5  CL 99 101 99  CO2 21* 22 27  BUN 48* 43* 43*  CREATININE 7.98* 6.75* 6.98*  CALCIUM 9.4 9.3 9.6  GLUCOSE 89 164* 87    Imaging/Diagnostic Tests: No results found.  Allayne Stack, DO 05/31/2018, 6:17 AM PGY-1, [place] [facility] FPTS Intern pager: [phone], text pages welcome

## 2018-05-31 NOTE — Progress Notes (Signed)
[facility] Progress Note   Subjective:  Seen on HD, no new c/o.   Objective Vitals:   05/31/18 1030 05/31/18 1100 05/31/18 1130 05/31/18 1145  BP: (!) 116/32 93/66 (!) 82/70 (!) 105/55  Pulse: 65 (!) 111 (!) 55 66  Resp:      Temp:      TempSrc:      SpO2:      Weight:      Height:       Physical Exam General: Well appearing man, NAD Heart: RRR; 3/6 systolic murmur Lungs: CTAB  Extremities: trace LE edema - chronic Dialysis Access:  LUE AVG + thrill  Dialysis Orders:  TTS [facility] 3.5h   66kg   2K/2.5Ca  P4   LUE AVG   Hep none -Hectorol 3 mcg IV TIW -Venofer 50mg  IV q weeks  -Mircera IV q 2 weeks last 8/20  -Ca acetate 4 tid qac--> stopped while hospitalized in light of hyperCa  Assessment/Plan: 1. NSTEMI: Followed by [facility] showing severe CAD, HFrEF (EF 20-25% or lower), severe calcified AoV: Cardiology formulating plan, looks like [facility]/ PCI tomorrow; TAVR eval in progress. 2. ESRD: Usually TTS, but [facility] planned for Tuesday so HD today to facilitate.  3. HTN/volume: BP low at times, on metoprolol. Challenging EDW as tolerated.  4. Anemia: Hgb 10.9. Not due for ESA yet.  Would be due 9/3.  5. Secondary hyperparathyroidism: Corr Ca high on admit, improved with changing to non-Ca binder on 8/28 and changing to 2Ca bath. Phos improved from 9 to 6.8 after changing to renvela.  CTM.  6. Nutrition: Alb low. Pro-stat supps. 7. HIV: Continue home meds. 8. Thrombocytopenia: Chronic issue, stable.   Vinson Moselle MD [employer] pgr [phone]   05/31/2018, 12:07 PM Additional Objective Labs: Basic Metabolic Panel: Recent Labs  Lab 05/25/18 0500  05/27/18 0528 05/29/18 0840 05/31/18 0457  NA 136   < > 136 136 139  K 5.4*   < > 4.8 4.6 3.5  CL 97*   < > 99 101 99  CO2 19*   < > 21* 22 27  GLUCOSE 126*   < > 89 164* 87  BUN 46*   < > 48* 43* 43*  CREATININE 9.74*   < > 7.98* 6.75* 6.98*  CALCIUM 10.0   < > 9.4 9.3 9.6  PHOS 9.0*  --    --  6.8*  --    < > = values in this interval not displayed.   Liver Function Tests: Recent Labs  Lab 05/25/18 0500 05/29/18 0840  ALBUMIN 3.0* 2.9*   CBC: Recent Labs  Lab 05/27/18 0528 05/29/18 0840 05/31/18 0457  WBC 9.3 6.2 7.3  NEUTROABS  --   --  3.3  HGB 10.9* 10.9* 11.2*  HCT 31.8* 34.9* 35.4*  MCV 104.6* 110.1* 110.3*  PLT 65* 94* 92*   Cardiac Enzymes: No results for input(s): CKTOTAL, CKMB, CKMBINDEX, TROPONINI in the last 168 hours. Medications: . sodium chloride    . sodium chloride    . sodium chloride    . sodium chloride    . [START ON 06/15/2018] sodium chloride     . sodium chloride   Intravenous Once  . aspirin  81 mg Oral Daily  . [START ON 06/11/2018] aspirin  81 mg Oral Pre-Cath  . clopidogrel  300 mg Oral Once  . [START ON 06/04/2018] clopidogrel  75 mg Oral Daily  . darunavir  800 mg Oral Q supper  .  diphenhydrAMINE  50 mg Intravenous Once  . dolutegravir  50 mg Oral Q supper  . enoxaparin (LOVENOX) injection  30 mg Subcutaneous Q24H  . feeding supplement (PRO-STAT SUGAR FREE 64)  30 mL Oral BID  . gabapentin  100 mg Oral Daily  . metoprolol tartrate  12.5 mg Oral BID  . pantoprazole  40 mg Oral BID  . ritonavir  100 mg Oral Q supper  . rosuvastatin  5 mg Oral q1800  . sevelamer carbonate  1,600 mg Oral TID WC  . sodium chloride flush  3 mL Intravenous Q12H  . sodium chloride flush  3 mL Intravenous Q12H  . tenofovir  300 mg Oral Weekly

## 2018-06-01 ENCOUNTER — Inpatient Hospital Stay (HOSPITAL_COMMUNITY): Admission: EM | Disposition: E | Payer: Self-pay | Source: Home / Self Care | Attending: Family Medicine

## 2018-06-01 ENCOUNTER — Encounter (HOSPITAL_COMMUNITY): Payer: Self-pay | Admitting: General Practice

## 2018-06-01 DIAGNOSIS — I2510 Atherosclerotic heart disease of native coronary artery without angina pectoris: Secondary | ICD-10-CM

## 2018-06-01 HISTORY — PX: CORONARY ATHERECTOMY: CATH118238

## 2018-06-01 HISTORY — PX: CARDIAC CATHETERIZATION: SHX172

## 2018-06-01 HISTORY — PX: CORONARY STENT INTERVENTION: CATH118234

## 2018-06-01 LAB — POCT ACTIVATED CLOTTING TIME
ACTIVATED CLOTTING TIME: 461 s
Activated Clotting Time: 285 seconds
Activated Clotting Time: 169 seconds

## 2018-06-01 LAB — BASIC METABOLIC PANEL
Anion gap: 11 (ref 5–15)
BUN: 31 mg/dL — AB (ref 6–20)
CHLORIDE: 101 mmol/L (ref 98–111)
CO2: 29 mmol/L (ref 22–32)
CREATININE: 5.47 mg/dL — AB (ref 0.61–1.24)
Calcium: 9.3 mg/dL (ref 8.9–10.3)
GFR calc non Af Amer: 11 mL/min — ABNORMAL LOW (ref >60)
GFR, EST AFRICAN AMERICAN: 13 mL/min — AB (ref >60)
GLUCOSE: 84 mg/dL (ref 70–99)
Potassium: 3.8 mmol/L (ref 3.5–5.1)
Sodium: 141 mmol/L (ref 135–145)

## 2018-06-01 LAB — CBC
HEMATOCRIT: 33.6 % — AB (ref 39.0–52.0)
HEMOGLOBIN: 10.6 g/dL — AB (ref 13.0–17.0)
MCH: 34.8 pg — ABNORMAL HIGH (ref 26.0–34.0)
MCHC: 31.5 g/dL (ref 30.0–36.0)
MCV: 110.2 fL — ABNORMAL HIGH (ref 78.0–100.0)
Platelets: 95 10*3/uL — ABNORMAL LOW (ref 150–400)
RBC: 3.05 MIL/uL — ABNORMAL LOW (ref 4.22–5.81)
RDW: 18.5 % — ABNORMAL HIGH (ref 11.5–15.5)
WBC: 6.7 10*3/uL (ref 4.0–10.5)

## 2018-06-01 SURGERY — CORONARY STENT INTERVENTION
Anesthesia: LOCAL

## 2018-06-01 MED ORDER — MORPHINE SULFATE (PF) 2 MG/ML IV SOLN
2.0000 mg | INTRAVENOUS | Status: DC | PRN
  Administered 2018-06-01 – 2018-06-02 (×5): 2 mg via INTRAVENOUS
  Filled 2018-06-01: qty 1
  Filled 2018-06-01 (×2): qty 2
  Filled 2018-06-01: qty 1

## 2018-06-01 MED ORDER — LIDOCAINE HCL (PF) 1 % IJ SOLN
INTRAMUSCULAR | Status: DC | PRN
  Administered 2018-06-01: 18 mL

## 2018-06-01 MED ORDER — HEART ATTACK BOUNCING BOOK
Freq: Once | Status: AC
  Administered 2018-06-02: 07:00:00
  Filled 2018-06-01: qty 1

## 2018-06-01 MED ORDER — NOREPINEPHRINE 4 MG/250ML-% IV SOLN
INTRAVENOUS | Status: AC
  Filled 2018-06-01: qty 250

## 2018-06-01 MED ORDER — THE SENSUOUS HEART BOOK
Freq: Once | Status: AC
  Administered 2018-06-02: 07:00:00
  Filled 2018-06-01: qty 1

## 2018-06-01 MED ORDER — LIDOCAINE HCL (PF) 1 % IJ SOLN
INTRAMUSCULAR | Status: AC
  Filled 2018-06-01: qty 30

## 2018-06-01 MED ORDER — SODIUM CHLORIDE 0.9 % IV SOLN: INTRAVENOUS | Status: DC

## 2018-06-01 MED ORDER — ANGIOPLASTY BOOK
Freq: Once | Status: DC
  Filled 2018-06-01: qty 1

## 2018-06-01 MED ORDER — MIDAZOLAM HCL 2 MG/2ML IJ SOLN
INTRAMUSCULAR | Status: AC
  Filled 2018-06-01: qty 2

## 2018-06-01 MED ORDER — METHYLPREDNISOLONE SODIUM SUCC 40 MG IJ SOLR
40.0000 mg | INTRAMUSCULAR | Status: DC
  Administered 2018-06-01: 40 mg via INTRAVENOUS
  Filled 2018-06-01: qty 1

## 2018-06-01 MED ORDER — NOREPINEPHRINE BITARTRATE 1 MG/ML IV SOLN
INTRAVENOUS | Status: DC | PRN
  Administered 2018-06-01: 5 ug/min via INTRAVENOUS

## 2018-06-01 MED ORDER — HYDRALAZINE HCL 20 MG/ML IJ SOLN: 5.0000 mg | INTRAMUSCULAR | Status: AC | PRN

## 2018-06-01 MED ORDER — DIPHENHYDRAMINE HCL 25 MG PO CAPS: 50.0000 mg | ORAL_CAPSULE | Freq: Once | ORAL | Status: AC

## 2018-06-01 MED ORDER — VIPERSLIDE LUBRICANT OPTIME
TOPICAL | Status: DC | PRN
  Administered 2018-06-01: 20 mL via SURGICAL_CAVITY

## 2018-06-01 MED ORDER — SODIUM CHLORIDE 0.9% FLUSH: 3.0000 mL | INTRAVENOUS | Status: DC | PRN

## 2018-06-01 MED ORDER — DIPHENHYDRAMINE HCL 50 MG/ML IJ SOLN
50.0000 mg | Freq: Once | INTRAMUSCULAR | Status: AC
  Administered 2018-06-01: 50 mg via INTRAVENOUS
  Filled 2018-06-01: qty 1

## 2018-06-01 MED ORDER — SODIUM CHLORIDE 0.9% FLUSH: 3.0000 mL | Freq: Two times a day (BID) | INTRAVENOUS | Status: DC

## 2018-06-01 MED ORDER — NITROGLYCERIN 1 MG/10 ML FOR IR/CATH LAB
INTRA_ARTERIAL | Status: AC
  Filled 2018-06-01: qty 10

## 2018-06-01 MED ORDER — SODIUM CHLORIDE 0.9 % IV SOLN: 250.0000 mL | INTRAVENOUS | Status: DC | PRN

## 2018-06-01 MED ORDER — IOHEXOL 350 MG/ML SOLN
INTRAVENOUS | Status: DC | PRN
  Administered 2018-06-01: 85 mL via INTRA_ARTERIAL

## 2018-06-01 MED ORDER — HEPARIN (PORCINE) IN NACL 1000-0.9 UT/500ML-% IV SOLN
INTRAVENOUS | Status: AC
  Filled 2018-06-01: qty 1500

## 2018-06-01 MED ORDER — SODIUM CHLORIDE 0.9% FLUSH
3.0000 mL | Freq: Two times a day (BID) | INTRAVENOUS | Status: DC
  Administered 2018-06-03 – 2018-06-06 (×6): 3 mL via INTRAVENOUS

## 2018-06-01 MED ORDER — MIDAZOLAM HCL 2 MG/2ML IJ SOLN
INTRAMUSCULAR | Status: DC | PRN
  Administered 2018-06-01: 1 mg via INTRAVENOUS

## 2018-06-01 MED ORDER — HEPARIN SODIUM (PORCINE) 1000 UNIT/ML IJ SOLN
INTRAMUSCULAR | Status: DC | PRN
  Administered 2018-06-01: 10000 [IU] via INTRAVENOUS

## 2018-06-01 MED ORDER — LABETALOL HCL 5 MG/ML IV SOLN: 10.0000 mg | INTRAVENOUS | Status: AC | PRN

## 2018-06-01 MED ORDER — ASPIRIN 81 MG PO CHEW: 81.0000 mg | CHEWABLE_TABLET | ORAL | Status: AC

## 2018-06-01 MED ORDER — HEPARIN (PORCINE) IN NACL 1000-0.9 UT/500ML-% IV SOLN
INTRAVENOUS | Status: DC | PRN
  Administered 2018-06-01 (×3): 500 mL

## 2018-06-01 MED ORDER — METHYLPREDNISOLONE SODIUM SUCC 125 MG IJ SOLR
80.0000 mg | Freq: Once | INTRAMUSCULAR | Status: AC
  Administered 2018-06-01: 80 mg via INTRAVENOUS
  Filled 2018-06-01: qty 2

## 2018-06-01 MED ORDER — FENTANYL CITRATE (PF) 100 MCG/2ML IJ SOLN
INTRAMUSCULAR | Status: AC
  Filled 2018-06-01: qty 2

## 2018-06-01 MED ORDER — ENOXAPARIN SODIUM 40 MG/0.4ML ~~LOC~~ SOLN
30.0000 mg | SUBCUTANEOUS | Status: DC
  Administered 2018-06-02 – 2018-06-06 (×4): 30 mg via SUBCUTANEOUS
  Filled 2018-06-01 (×6): qty 0.4

## 2018-06-01 MED ORDER — MORPHINE SULFATE (PF) 2 MG/ML IV SOLN: 2.0000 mg | Freq: Once | INTRAVENOUS | Status: DC

## 2018-06-01 MED ORDER — HEPARIN SODIUM (PORCINE) 1000 UNIT/ML IJ SOLN
INTRAMUSCULAR | Status: AC
  Filled 2018-06-01: qty 1

## 2018-06-01 MED ORDER — SODIUM CHLORIDE 0.9 % IV SOLN
INTRAVENOUS | Status: AC | PRN
  Administered 2018-06-01: 100 mL via INTRAVENOUS

## 2018-06-01 MED ORDER — FENTANYL CITRATE (PF) 100 MCG/2ML IJ SOLN
INTRAMUSCULAR | Status: DC | PRN
  Administered 2018-06-01: 25 ug via INTRAVENOUS
  Administered 2018-06-01: 12.5 ug via INTRAVENOUS

## 2018-06-01 SURGICAL SUPPLY — 22 items
BALLN SAPPHIRE 2.0X15 (BALLOONS) ×2
BALLN SAPPHIRE ~~LOC~~ 2.25X12 (BALLOONS) ×1 IMPLANT
BALLN SAPPHIRE ~~LOC~~ 2.5X15 (BALLOONS) ×2 IMPLANT
BALLOON SAPPHIRE 2.0X15 (BALLOONS) IMPLANT
CATH TELEPORT (CATHETERS) ×1 IMPLANT
CATH VISTA GUIDE 6FR XBLAD3.5 (CATHETERS) ×2 IMPLANT
CROWN DIAMONDBACK CLASSIC 1.25 (BURR) ×1 IMPLANT
ELECT DEFIB PAD ADLT CADENCE (PAD) ×2 IMPLANT
KIT ENCORE 26 ADVANTAGE (KITS) ×1 IMPLANT
KIT HEART LEFT (KITS) ×2 IMPLANT
KIT MICROPUNCTURE NIT STIFF (SHEATH) ×1 IMPLANT
LUBRICANT VIPERSLIDE CORONARY (MISCELLANEOUS) ×1 IMPLANT
PACK CARDIAC CATHETERIZATION (CUSTOM PROCEDURE TRAY) ×2 IMPLANT
SHEATH PINNACLE 6F 10CM (SHEATH) ×1 IMPLANT
SHEATH PINNACLE 7F 10CM (SHEATH) ×2 IMPLANT
SHEATH PROBE COVER 6X72 (BAG) ×1 IMPLANT
STENT SIERRA 2.50 X 18 MM (Permanent Stent) ×1 IMPLANT
TRANSDUCER W/STOPCOCK (MISCELLANEOUS) ×2 IMPLANT
TUBING CIL FLEX 10 FLL-RA (TUBING) ×2 IMPLANT
WIRE COUGAR XT STRL 300CM (WIRE) ×1 IMPLANT
WIRE EMERALD 3MM-J .035X150CM (WIRE) ×2 IMPLANT
WIRE VIPER ADVANCE COR .012TIP (WIRE) ×1 IMPLANT

## 2018-06-01 NOTE — H&P (View-Only) (Signed)
Progress Note  Patient Name: [name] Date of Encounter: Jun 17, 2018  Primary Cardiologist: Thurmon Fair, MD   Subjective   Denies CP or dyspnea  Inpatient Medications    Scheduled Meds: . sodium chloride   Intravenous Once  . aspirin  81 mg Oral Daily  . clopidogrel  75 mg Oral Daily  . darunavir  800 mg Oral Q supper  . diphenhydrAMINE  50 mg Intravenous Once  . dolutegravir  50 mg Oral Q supper  . enoxaparin (LOVENOX) injection  30 mg Subcutaneous Q24H  . feeding supplement (PRO-STAT SUGAR FREE 64)  30 mL Oral BID  . gabapentin  100 mg Oral Daily  . metoprolol tartrate  12.5 mg Oral BID  . pantoprazole  40 mg Oral BID  . ritonavir  100 mg Oral Q supper  . rosuvastatin  5 mg Oral q1800  . sevelamer carbonate  1,600 mg Oral TID WC  . sodium chloride flush  3 mL Intravenous Q12H  . sodium chloride flush  3 mL Intravenous Q12H  . tenofovir  300 mg Oral Weekly   Continuous Infusions: . sodium chloride    . sodium chloride    . sodium chloride Stopped (06/28/2018 0022)   PRN Meds: sodium chloride, sodium chloride, acetaminophen, nitroGLYCERIN, ondansetron (ZOFRAN) IV, ondansetron (ZOFRAN) IV, oxyCODONE-acetaminophen, polyvinyl alcohol, sodium chloride flush, sodium chloride flush   Vital Signs    Vitals:   05/31/18 2016 17-Jun-2018 0015 17-Jun-2018 0500 06/28/2018 0615  BP: (!) 97/40 (!) 101/38  (!) 130/52  Pulse: 65 60  66  Resp: 15 16  (!) 22  Temp: 97.8 F (36.6 C) 97.8 F (36.6 C)  98.3 F (36.8 C)  TempSrc: Oral Oral  Oral  SpO2: 100% 100%  97%  Weight:   67.1 kg   Height:        Intake/Output Summary (Last 24 hours) at Jun 17, 2018 0738 Last data filed at 17-Jun-2018 0200 Gross per 24 hour  Intake 966 ml  Output 2542 ml  Net -1576 ml   Filed Weights   05/31/18 0840 05/31/18 1215 06/21/2018 0500  Weight: 70.8 kg 67.9 kg 67.1 kg    Telemetry    Sinus with PVCs and 3 beats NSVT- Personally Reviewed   Physical Exam   GEN: No acute distress.   Chronically ill appearing Neck: No JVD Cardiac: RRR, 3/6 systolic murmur Respiratory: Clear to auscultation bilaterally. GI: Soft, nontender, non-distended  MS: No edema; LUE fistula Neuro:  Nonfocal  Psych: Normal affect   Labs    Chemistry Recent Labs  Lab 05/27/18 0528 05/29/18 0840 05/31/18 0457  NA 136 136 139  K 4.8 4.6 3.5  CL 99 101 99  CO2 21* 22 27  GLUCOSE 89 164* 87  BUN 48* 43* 43*  CREATININE 7.98* 6.75* 6.98*  CALCIUM 9.4 9.3 9.6  ALBUMIN  --  2.9*  --   GFRNONAA 7* 9* 8*  GFRAA 8* 10* 10*  ANIONGAP 16* 13 13     Hematology Recent Labs  Lab 05/27/18 0528 05/29/18 0840 05/31/18 0457  WBC 9.3 6.2 7.3  RBC 3.04* 3.17* 3.21*  HGB 10.9* 10.9* 11.2*  HCT 31.8* 34.9* 35.4*  MCV 104.6* 110.1* 110.3*  MCH 35.9* 34.4* 34.9*  MCHC 34.3 31.2 31.6  RDW 18.5* 19.1* 18.4*  PLT 65* 94* 92*     Patient Profile     52 y.o. 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  Assessment & Plan    1 coronary artery disease-plan PCI of LAD today.  Continue aspirin, Plavix and statin.  Premedicate for dye allergy.  We will give Benadryl and Solu-Medrol.  2 aortic stenosis/aortic insufficiency-plan is for TAVR following PCI; time to be determined.  3 end-stage renal disease-dialysis per nephrology.  4 cardiomyopathy-hopefully will improve following PCI and AVR.  Continue low-dose beta-blocker.  Would change to Toprol at discharge.  Cannot add ARB or ACE inhibitor as blood pressure is borderline.  5 HIV-Per primary care.  For questions or updates, please contact [facility] Please consult [email or web address] for contact info under Cardiology/STEMI.      Signed, Olga Millers, MD  06/25/2018, 7:38 AM

## 2018-06-01 NOTE — Evaluation (Signed)
Physical Therapy Evaluation Patient Details Name: [name] MRN: [id number] DOB: 1966/08/24 Today's Date: 06/16/2018   History of Present Illness  52 y.o. 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  Going for stent placement 06/16/2018.   Clinical Impression  Pt admitted with above diagnosis. Pt currently with functional limitations due to the deficits listed below (see PT Problem List). Pt has balance deficits and endurance issues that impair function and independence.  Pt will need 24 hour care at home.  Also performed TAVR assessment.  See below.   Pt will benefit from skilled PT to increase their independence and safety with mobility to allow discharge to the venue listed below.      Follow Up Recommendations Home health PT;Supervision/Assistance - 24 hour    Equipment Recommendations  Rolling walker with 5" wheels;3in1 (PT)    Recommendations for Other Services       Precautions / Restrictions Precautions Precautions: Fall Restrictions Weight Bearing Restrictions: No      Mobility  Bed Mobility Overal bed mobility: Independent                Transfers Overall transfer level: Independent                  Ambulation/Gait Ambulation/Gait assistance: Min assist Gait Distance (Feet): 420 Feet Assistive device: None Gait Pattern/deviations: Step-through pattern;Decreased stride length;Staggering left;Staggering right;Drifts right/left;Shuffle   Gait velocity interpretation: <1.31 ft/sec, indicative of household ambulator General Gait Details: Pt with poor balance overall.  Needed assist to steady a few times while ambulating.  Fatigues quickly and needed a few standing rest breaks.    Stairs            Wheelchair  Mobility    Modified Rankin (Stroke Patients Only)       Balance Overall balance assessment: Needs assistance Sitting-balance support: No upper extremity supported;Feet supported Sitting balance-Leahy Scale: Good     Standing balance support: No upper extremity supported;During functional activity Standing balance-Leahy Scale: Poor Standing balance comment: Needed assist to steady several times due to poor balance reactions and staggers with dynamic gait.  Cannot stand statically without UE support as well.                              Pertinent Vitals/Pain Pain Assessment: No/denies pain    Home Living Family/patient expects to be discharged to:: Private residence Living Arrangements: Spouse/significant other Available Help at Discharge: Family           Home Equipment: None      Prior Function Level of Independence: Independent               Hand Dominance        Extremity/Trunk Assessment   Upper Extremity Assessment Upper Extremity Assessment: Defer to OT evaluation    Lower Extremity Assessment Lower Extremity Assessment: RLE deficits/detail;LLE deficits/detail RLE Deficits / Details: grossly 3/5 RLE Coordination: decreased gross motor LLE Deficits / Details: grossly 3/5 LLE Coordination: decreased gross motor    Cervical / Trunk Assessment Cervical / Trunk Assessment: Normal  Communication   Communication: No difficulties  Cognition Arousal/Alertness: Awake/alert Behavior During Therapy: Flat affect Overall Cognitive Status: Within Functional Limits for tasks assessed  General Comments: Pt with some decr awareness of safety as he did stagger and he kept stating his balance was good      General Comments   6 Minute Walk Test  Distance - 420 ft      Pre    Post  BP     101/64    119/75  HR    66    78  SaO2    91    86  Modified Borg  Dyspnea 2    3  RPE    6    13    5  Meter Walk Test  Trial   1) 13.25 sec   2) 11.65 sec  3) 12.82 sec  Average - 12.57 sec = 1.30 ft/sec   Clinical Frailty Scale - 4              Exercises     Assessment/Plan    PT Assessment Patient needs continued PT services  PT Problem List Decreased activity tolerance;Decreased balance;Decreased mobility;Decreased knowledge of use of DME;Decreased safety awareness;Decreased knowledge of precautions;Cardiopulmonary status limiting activity       PT Treatment Interventions DME instruction;Gait training;Functional mobility training;Therapeutic activities;Therapeutic exercise;Balance training;Patient/family education    PT Goals (Current goals can be found in the Care Plan section)  Acute Rehab PT Goals Patient Stated Goal: to get better PT Goal Formulation: With patient Time For Goal Achievement: 06/15/18 Potential to Achieve Goals: Good    Frequency Min 3X/week   Barriers to discharge        Co-evaluation               AM-PAC PT "6 Clicks" Daily Activity  Outcome Measure Difficulty turning over in bed (including adjusting bedclothes, sheets and blankets)?: None Difficulty moving from lying on back to sitting on the side of the bed? : None Difficulty sitting down on and standing up from a chair with arms (e.g., wheelchair, bedside commode, etc,.)?: A Little Help needed moving to and from a bed to chair (including a wheelchair)?: A Little Help needed walking in hospital room?: A Lot Help needed climbing 3-5 steps with a railing? : A Lot 6 Click Score: 18    End of Session Equipment Utilized During Treatment: Gait belt Activity Tolerance: Patient limited by fatigue Patient left: in bed;with call bell/phone within reach;with nursing/sitter in room;with family/visitor present Nurse Communication: Mobility status PT Visit Diagnosis: Unsteadiness on feet (R26.81)    Time: 1610-9604 PT Time Calculation  (min) (ACUTE ONLY): 28 min   Charges:   PT Evaluation $PT Eval Moderate Complexity: 1 Mod PT Treatments $Gait Training: 8-22 mins        Rakim Moone,PT Acute Rehabilitation Services Pager:  [phone]  Office:  [phone]    Berline Lopes 03-Jun-2018, 11:48 AM

## 2018-06-01 NOTE — Progress Notes (Signed)
Site area: right groin  Site Prior to Removal:  Level 0  Pressure Applied For 25 MINUTES    Minutes Beginning at 1625   Manual:   Yes.    Patient Status During Pull:  stable  Post Pull Groin Site:  Level 0  Post Pull Instructions Given:  Yes.    Post Pull Pulses Present:  Yes.    Dressing Applied:  Yes.    Comments:

## 2018-06-01 NOTE — Interval H&P Note (Signed)
History and Physical Interval Note:  05/31/2018 10:12 AM  [name]  has presented today for cardiac cath with the diagnosis of cad  The various methods of treatment have been discussed with the patient and family. After consideration of risks, benefits and other options for treatment, the patient has consented to  Procedure(s): CORONARY STENT INTERVENTION (N/A) as a surgical intervention .  The patient's history has been reviewed, patient examined, no change in status, stable for surgery.  I have reviewed the patient's chart and labs.  Questions were answered to the patient's satisfaction.    Cath Lab Visit (complete for each Cath Lab visit)  Clinical Evaluation Leading to the Procedure:   ACS: No.  Non-ACS:    Anginal Classification: CCS III  Anti-ischemic medical therapy: No Therapy  Non-Invasive Test Results: No non-invasive testing performed  Prior CABG: No previous CABG         Verne Carrow

## 2018-06-01 NOTE — Progress Notes (Addendum)
Family Medicine Teaching Service Daily Progress Note Intern Pager: [phone]  Patient name: [name] Medical record number: [id number] Date of birth: 11-30-1965 Age: 52 y.o. Gender: male  Primary Care Provider: Patient, No Pcp Per Consultants: Cards Code Status: Full   Pt Overview and Major Events to Date:  8/23 Admit to FPTS for NSTEMI with TTE showing EF 20-25%, started on heparin and nitro drip 8/26 Cardiac cath 8/30 TEE, CTA  Assessment and Plan: Caleb Walls Cunninghamis a 52 y.o.malepresenting with chest pain and feeling unwell found to have NSTEMI. PMH is significant forHIV, ESRD, HSV, anemia, peripheral neuropathy, anemia, GI bleed, PUD, AS.  Inferolateral NSTEMI: Plan for PCI today. Planning for TAVR likely at a later date. Denies CP or SOB this am.  -F/u cards recs  -Continue aspirin 81 mg daily, Lopressor 12.5 mg twice daily, Crestor 5 -Telemetry and continuous pulse ox  -- F/u on PCI procedure   ESRD on HD: Chronic. Stable. Currently receives HD on T/Th/Sa with LUE AVF.  -continue HD on current schedule; no HD 9/3 d/t procedure, received yesterday  -Nephrology following, appreciate recs   Hypertension:Stable. Softer pressures-normotensive overnight. -Continue homeLopressor 12.5 mg BID -monitor vitals and clinical symptomatolgy  Borderline Lymphadenopathy: Borderline enlarged in mediastinum (1.3/1cm) and retroperitoneal nodes (1cm) on CT 8/30, similar to previous CT in 2017. Thought to be likely 2/2 chronic lymphatic congestion or inflammation, however could not r/o CLL via imaging. No intervention at this time given stability and no further signs or symptoms concerning for CLL.  -No intervention -WBC with diff wnl   HIV: Chronic. Well-controlled. Last CD4 780, viral load undetectable. -Continue tivicay and prezista  Post herpetic lesion: No complaints of pain this am. -continuehomegabapentin 100mg  daily -monitor pain  HLD:  Chronic Will look into possible interactions with HIV medications w/ consideration of increasing to high intensity given CAD.  - cont crestor 5mg  QD, potentially increase to 20mg    HSV: Chronic, stable On Acyclovir prn. No current outbreak. - consider acyclovir prn if symptoms arise during hospitalization   PUD: Stable On protonix 40mg  BID at home. - cont home protonix - cont to monitor CBC  Anemia: Chronic, stable Likely related to CKD. Hgb11.2 on 9/2 from 10.9, at baseline.  - cont to monitor CBC  Peripheral Neuropathy: Chronic Symptoms unchanged. Last Vitamin B12 12/2016, 332. - cont to monitor   Pterygium OU: Chronic No change in symptoms, has seen ophtho in the past. - cont artificial tears prn  Tobacco Abuse Current 0.5 ppd smoker. - encourage tobacco cessation - consider nicotine patch prn  FEN/GI: renal/fluid restricted PPx: Heparin  Disposition: PCI this am, continued care   Subjective:  No acute events overnight. No complaint this am, "ready as he'll ever be" for his PCI today.   Objective: Temp:  [97.3 F (36.3 C)-98.3 F (36.8 C)] 98.3 F (36.8 C) (09/03 0615) Pulse Rate:  [55-111] 66 (09/03 0615) Resp:  [15-22] 22 (09/03 0615) BP: (82-144)/(32-81) 130/52 (09/03 0615) SpO2:  [97 %-100 %] 97 % (09/03 0615) Weight:  [67.1 kg-70.8 kg] 67.1 kg (09/03 0500) Physical Exam: General: Alert, NAD HEENT: NCAT, MMM Cardiac: RRR 2-3 systolic murmur.  Lungs: Clear bilaterally, no increased WOB  Abdomen: soft, non-tender, non-distended, normoactive BS, abdominal midline vertical scar.  Msk: Moves all extremities spontaneously  Ext: Warm, dry, 2+ distal pulses, 1+ pitting LE edema    Laboratory: Recent Labs  Lab 05/27/18 0528 05/29/18 0840 05/31/18 0457  WBC 9.3 6.2 7.3  HGB 10.9* 10.9* 11.2*  HCT 31.8* 34.9* 35.4*  PLT 65* 94* 92*   Recent Labs  Lab 05/27/18 0528 05/29/18 0840 05/31/18 0457  NA 136 136 139  K 4.8 4.6 3.5  CL 99 101 99   CO2 21* 22 27  BUN 48* 43* 43*  CREATININE 7.98* 6.75* 6.98*  CALCIUM 9.4 9.3 9.6  GLUCOSE 89 164* 87     Imaging/Diagnostic Tests: No results found.  Allayne Stack, DO 06/26/2018, 6:20 AM PGY-1, [facility] Family Medicine FPTS Intern pager: [phone], text pages welcome

## 2018-06-01 NOTE — Progress Notes (Signed)
 Progress Note  Patient Name: [name] Date of Encounter: 06/16/2018  Primary Cardiologist: Mihai Croitoru, MD   Subjective   Denies CP or dyspnea  Inpatient Medications    Scheduled Meds: . sodium chloride   Intravenous Once  . aspirin  81 mg Oral Daily  . clopidogrel  75 mg Oral Daily  . darunavir  800 mg Oral Q supper  . diphenhydrAMINE  50 mg Intravenous Once  . dolutegravir  50 mg Oral Q supper  . enoxaparin (LOVENOX) injection  30 mg Subcutaneous Q24H  . feeding supplement (PRO-STAT SUGAR FREE 64)  30 mL Oral BID  . gabapentin  100 mg Oral Daily  . metoprolol tartrate  12.5 mg Oral BID  . pantoprazole  40 mg Oral BID  . ritonavir  100 mg Oral Q supper  . rosuvastatin  5 mg Oral q1800  . sevelamer carbonate  1,600 mg Oral TID WC  . sodium chloride flush  3 mL Intravenous Q12H  . sodium chloride flush  3 mL Intravenous Q12H  . tenofovir  300 mg Oral Weekly   Continuous Infusions: . sodium chloride    . sodium chloride    . sodium chloride Stopped (06/06/2018 0022)   PRN Meds: sodium chloride, sodium chloride, acetaminophen, nitroGLYCERIN, ondansetron (ZOFRAN) IV, ondansetron (ZOFRAN) IV, oxyCODONE-acetaminophen, polyvinyl alcohol, sodium chloride flush, sodium chloride flush   Vital Signs    Vitals:   05/31/18 2016 06/06/2018 0015 06/28/2018 0500 06/21/2018 0615  BP: (!) 97/40 (!) 101/38  (!) 130/52  Pulse: 65 60  66  Resp: 15 16  (!) 22  Temp: 97.8 F (36.6 C) 97.8 F (36.6 C)  98.3 F (36.8 C)  TempSrc: Oral Oral  Oral  SpO2: 100% 100%  97%  Weight:   67.1 kg   Height:        Intake/Output Summary (Last 24 hours) at 06/14/2018 0738 Last data filed at 06/04/2018 0200 Gross per 24 hour  Intake 966 ml  Output 2542 ml  Net -1576 ml   Filed Weights   05/31/18 0840 05/31/18 1215 05/31/2018 0500  Weight: 70.8 kg 67.9 kg 67.1 kg    Telemetry    Sinus with PVCs and 3 beats NSVT- Personally Reviewed   Physical Exam   GEN: No acute distress.   Chronically ill appearing Neck: No JVD Cardiac: RRR, 3/6 systolic murmur Respiratory: Clear to auscultation bilaterally. GI: Soft, nontender, non-distended  MS: No edema; LUE fistula Neuro:  Nonfocal  Psych: Normal affect   Labs    Chemistry Recent Labs  Lab 05/27/18 0528 05/29/18 0840 05/31/18 0457  NA 136 136 139  K 4.8 4.6 3.5  CL 99 101 99  CO2 21* 22 27  GLUCOSE 89 164* 87  BUN 48* 43* 43*  CREATININE 7.98* 6.75* 6.98*  CALCIUM 9.4 9.3 9.6  ALBUMIN  --  2.9*  --   GFRNONAA 7* 9* 8*  GFRAA 8* 10* 10*  ANIONGAP 16* 13 13     Hematology Recent Labs  Lab 05/27/18 0528 05/29/18 0840 05/31/18 0457  WBC 9.3 6.2 7.3  RBC 3.04* 3.17* 3.21*  HGB 10.9* 10.9* 11.2*  HCT 31.8* 34.9* 35.4*  MCV 104.6* 110.1* 110.3*  MCH 35.9* 34.4* 34.9*  MCHC 34.3 31.2 31.6  RDW 18.5* 19.1* 18.4*  PLT 65* 94* 92*     Patient Profile     52 y.o. 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  Assessment & Plan    1 coronary artery disease-plan PCI of LAD today.  Continue aspirin, Plavix and statin.  Premedicate for dye allergy.  We will give Benadryl and Solu-Medrol.  2 aortic stenosis/aortic insufficiency-plan is for TAVR following PCI; time to be determined.  3 end-stage renal disease-dialysis per nephrology.  4 cardiomyopathy-hopefully will improve following PCI and AVR.  Continue low-dose beta-blocker.  Would change to Toprol at discharge.  Cannot add ARB or ACE inhibitor as blood pressure is borderline.  5 HIV-Per primary care.  For questions or updates, please contact [facility] Please consult [email or web address] for contact info under Cardiology/STEMI.      Signed, Brian Crenshaw, MD  06/19/2018, 7:38 AM    

## 2018-06-01 NOTE — Progress Notes (Signed)
[facility] Progress Note   Subjective:  Seen on HD, no new c/o.   Objective Vitals:   06/14/2018 1152 06/09/2018 1157 06/03/2018 1202 06/23/2018 1207  BP: (!) 143/71 130/64 124/71 109/74  Pulse: 70 70 75 69  Resp: 18 (!) 7 (!) 29 (!) 26  Temp:      TempSrc:      SpO2: (!) 0% (!) 0% (!) 0% (!) 0%  Weight:      Height:       Physical Exam General: Well appearing man, NAD Heart: RRR; 3/6 systolic murmur Lungs: CTAB  Extremities: trace LE edema - chronic Dialysis Access:  LUE AVG + thrill  Dialysis Orders:  TTS [facility] 3.5h   66kg   2K/2.5Ca  P4   LUE AVG   Hep none -Hectorol 3 mcg IV TIW -Venofer 50mg  IV q weeks  -Mircera IV q 2 weeks last 8/20  -Ca acetate 4 tid qac--> stopped while hospitalized in light of hyperCa  Assessment/Plan: 1. NSTEMI: Followed by [facility] showing severe CAD, HFrEF (EF 20-25% or lower), severe calcified AoV. Going for PCI today; TAVR eval in progress. 2. ESRD: Usually TTS, but had HD on Monday to facilitate heart cath today.  Next HD is 9/5.  3. HTN/volume: BP low at times, on metoprolol. Challenging EDW as tolerated.  4. Anemia: Hgb 10.9. Not due for ESA yet.  Would be due 9/3.  5. Secondary hyperparathyroidism: Corr Ca high on admit, improved with changing to non-Ca binder on 8/28 and changing to 2Ca bath. Phos improved from 9 to 6.8 after changing to renvela.  CTM.  6. Nutrition: Alb low. Pro-stat supps. 7. HIV: Continue home meds. 8. Thrombocytopenia: Chronic issue, stable.   Vinson Moselle MD [employer] pgr [phone]   05/31/2018, 12:07 PM  Additional Objective Labs: Basic Metabolic Panel: Recent Labs  Lab 05/29/18 0840 05/31/18 0457 06/16/2018 0715  NA 136 139 141  K 4.6 3.5 3.8  CL 101 99 101  CO2 22 27 29   GLUCOSE 164* 87 84  BUN 43* 43* 31*  CREATININE 6.75* 6.98* 5.47*  CALCIUM 9.3 9.6 9.3  PHOS 6.8*  --   --    Liver Function Tests: Recent Labs  Lab 05/29/18 0840  ALBUMIN 2.9*    CBC: Recent Labs  Lab 05/27/18 0528 05/29/18 0840 05/31/18 0457 06/22/2018 0715  WBC 9.3 6.2 7.3 6.7  NEUTROABS  --   --  3.3  --   HGB 10.9* 10.9* 11.2* 10.6*  HCT 31.8* 34.9* 35.4* 33.6*  MCV 104.6* 110.1* 110.3* 110.2*  PLT 65* 94* 92* 95*   Cardiac Enzymes: No results for input(s): CKTOTAL, CKMB, CKMBINDEX, TROPONINI in the last 168 hours. Medications: . sodium chloride    . sodium chloride     . sodium chloride   Intravenous Once  . aspirin  81 mg Oral Daily  . clopidogrel  75 mg Oral Daily  . darunavir  800 mg Oral Q supper  . dolutegravir  50 mg Oral Q supper  . [START ON 06/02/2018] enoxaparin (LOVENOX) injection  30 mg Subcutaneous Q24H  . feeding supplement (PRO-STAT SUGAR FREE 64)  30 mL Oral BID  . gabapentin  100 mg Oral Daily  . metoprolol tartrate  12.5 mg Oral BID  . pantoprazole  40 mg Oral BID  . ritonavir  100 mg Oral Q supper  . rosuvastatin  5 mg Oral q1800  . sevelamer carbonate  1,600 mg Oral TID WC  .  sodium chloride flush  3 mL Intravenous Q12H  . sodium chloride flush  3 mL Intravenous Q12H  . tenofovir  300 mg Oral Weekly

## 2018-06-02 ENCOUNTER — Inpatient Hospital Stay (HOSPITAL_COMMUNITY): Payer: Medicare Other

## 2018-06-02 DIAGNOSIS — I350 Nonrheumatic aortic (valve) stenosis: Secondary | ICD-10-CM

## 2018-06-02 DIAGNOSIS — Z21 Asymptomatic human immunodeficiency virus [HIV] infection status: Secondary | ICD-10-CM

## 2018-06-02 LAB — BASIC METABOLIC PANEL
ANION GAP: 20 — AB (ref 5–15)
ANION GAP: 22 — AB (ref 5–15)
BUN: 51 mg/dL — ABNORMAL HIGH (ref 6–20)
BUN: 66 mg/dL — ABNORMAL HIGH (ref 6–20)
CALCIUM: 9.6 mg/dL (ref 8.9–10.3)
CHLORIDE: 96 mmol/L — AB (ref 98–111)
CO2: 23 mmol/L (ref 22–32)
CO2: 22 mmol/L (ref 22–32)
CREATININE: 7.6 mg/dL — AB (ref 0.61–1.24)
Calcium: 9.5 mg/dL (ref 8.9–10.3)
Chloride: 95 mmol/L — ABNORMAL LOW (ref 98–111)
Creatinine, Ser: 6.87 mg/dL — ABNORMAL HIGH (ref 0.61–1.24)
GFR calc non Af Amer: 8 mL/min — ABNORMAL LOW (ref >60)
GFR calc non Af Amer: 7 mL/min — ABNORMAL LOW (ref >60)
GFR, EST AFRICAN AMERICAN: 10 mL/min — AB (ref >60)
GFR, EST AFRICAN AMERICAN: 9 mL/min — AB (ref >60)
GLUCOSE: 147 mg/dL — AB (ref 70–99)
Glucose, Bld: 126 mg/dL — ABNORMAL HIGH (ref 70–99)
Potassium: 5.8 mmol/L — ABNORMAL HIGH (ref 3.5–5.1)
Potassium: 5.2 mmol/L — ABNORMAL HIGH (ref 3.5–5.1)
Sodium: 139 mmol/L (ref 135–145)
Sodium: 139 mmol/L (ref 135–145)

## 2018-06-02 LAB — PULMONARY FUNCTION TEST
DL/VA % pred: 81 %
DL/VA: 3.81 ml/min/mmHg/L
DLCO UNC % PRED: 34 %
DLCO UNC: 11.72 ml/min/mmHg
DLCO cor % pred: 38 %
DLCO cor: 13.02 ml/min/mmHg
FEF 25-75 POST: 1.54 L/s
FEF 25-75 PRE: 1.94 L/s
FEF2575-%Change-Post: -20 %
FEF2575-%Pred-Post: 45 %
FEF2575-%Pred-Pre: 56 %
FEV1-%Change-Post: -5 %
FEV1-%Pred-Post: 51 %
FEV1-%Pred-Pre: 54 %
FEV1-POST: 1.79 L
FEV1-PRE: 1.89 L
FEV1FVC-%Change-Post: 2 %
FEV1FVC-%PRED-PRE: 102 %
FEV6-%Change-Post: -7 %
FEV6-%PRED-POST: 50 %
FEV6-%PRED-PRE: 54 %
FEV6-POST: 2.13 L
FEV6-Pre: 2.31 L
FEV6FVC-%PRED-POST: 103 %
FEV6FVC-%Pred-Pre: 103 %
FVC-%CHANGE-POST: -7 %
FVC-%Pred-Post: 48 %
FVC-%Pred-Pre: 53 %
FVC-POST: 2.13 L
FVC-Pre: 2.31 L
POST FEV6/FVC RATIO: 100 %
PRE FEV1/FVC RATIO: 82 %
PRE FEV6/FVC RATIO: 100 %
Post FEV1/FVC ratio: 84 %
RV % PRED: 78 %
RV: 1.65 L
TLC % PRED: 54 %
TLC: 3.92 L

## 2018-06-02 LAB — CBC
HEMATOCRIT: 36.7 % — AB (ref 39.0–52.0)
HEMOGLOBIN: 11.5 g/dL — AB (ref 13.0–17.0)
MCH: 34.6 pg — ABNORMAL HIGH (ref 26.0–34.0)
MCHC: 31.3 g/dL (ref 30.0–36.0)
MCV: 110.5 fL — AB (ref 78.0–100.0)
Platelets: 96 10*3/uL — ABNORMAL LOW (ref 150–400)
RBC: 3.32 MIL/uL — ABNORMAL LOW (ref 4.22–5.81)
RDW: 18.6 % — ABNORMAL HIGH (ref 11.5–15.5)
WBC: 6.6 10*3/uL (ref 4.0–10.5)

## 2018-06-02 LAB — GLUCOSE, CAPILLARY
Glucose-Capillary: 118 mg/dL — ABNORMAL HIGH (ref 70–99)
Glucose-Capillary: 143 mg/dL — ABNORMAL HIGH (ref 70–99)

## 2018-06-02 MED ORDER — ROSUVASTATIN CALCIUM 10 MG PO TABS
10.0000 mg | ORAL_TABLET | Freq: Every day | ORAL | Status: DC
  Administered 2018-06-02 – 2018-06-16 (×15): 10 mg via ORAL
  Filled 2018-06-02 (×15): qty 1

## 2018-06-02 MED ORDER — ALBUTEROL SULFATE (2.5 MG/3ML) 0.083% IN NEBU
2.5000 mg | INHALATION_SOLUTION | Freq: Once | RESPIRATORY_TRACT | Status: AC
  Administered 2018-06-02: 12:00:00 2.5 mg via RESPIRATORY_TRACT

## 2018-06-02 MED ORDER — DEXTROSE 50 % IV SOLN
1.0000 | Freq: Once | INTRAVENOUS | Status: AC
  Administered 2018-06-02: 16:00:00 50 mL via INTRAVENOUS
  Filled 2018-06-02: qty 50

## 2018-06-02 MED ORDER — INSULIN ASPART 100 UNIT/ML IV SOLN
10.0000 [IU] | Freq: Once | INTRAVENOUS | Status: AC
  Administered 2018-06-02: 10 [IU] via INTRAVENOUS

## 2018-06-02 MED ORDER — SODIUM CHLORIDE 0.9 % IV SOLN
62.5000 mg | INTRAVENOUS | Status: DC
  Administered 2018-06-03: 62.5 mg via INTRAVENOUS
  Filled 2018-06-02 (×4): qty 5

## 2018-06-02 NOTE — Progress Notes (Signed)
CARDIAC REHAB PHASE I   PRE:  Rate/Rhythm: 63 SR  BP:  Supine:   Sitting: 100/79  Standing:    SaO2: WNR  MODE:  Ambulation: 250 ft   POST:  Rate/Rhythm: 70 SR  BP:  Supine:   Sitting: 102/61  Standing:    SaO2: would not register on ear or finger 0810-0903 Pt walked 250 ft on RA with rolling walker and asst x 1. Stopped once to lean on walker and rest due to SOB. PT recommended HHPT and RW if pt for discharge. To bed after walk. Reviewed stent and purpose of plavix, NTG use, MI restrictions and referred pt to dietitian at HD for ed. Pt stated he watches his sodium and HD keeps check on weight.  No CP after walk but stated chest felt warm. Did not give ex ed or refer to CRP2 as pt stated team is making decision re TAVR. Will continue to follow.    Luetta Nutting, RN BSN  06/02/2018 8:59 AM

## 2018-06-02 NOTE — Progress Notes (Addendum)
  [facility]   MULTIDISCIPLINARY HEART VALVE TEAM  Mr [name] is doing well s/p PTCA of the mid LAD with orbital atherectomy and stenting of the mid LAD by Dr. Clifton James. He is a bit groggy this morning and falls asleep mid conversation. He is scheduled for PFTs today and Dr. Excell Seltzer is going to see him tomorrow in consult. Plan is for TAVR next Tuesday. He is hyperkalemic and quite drowsy today. He needs to stay inpatient until at least tomorrow. We will decide on disposition tomorrow ( ie if he needs to stay inpatient or can go home before surgery).   Cline Crock PA-C  [facility]   I agree with the assessment and plan as outlined above.  Mr Calvi's case has been reviewed by our team.  He has done very well w/ PCI of LAD.  We plan TAVR on Tuesday 9/10.    Purcell Nails, MD 06/02/2018 1:42 PM

## 2018-06-02 NOTE — Progress Notes (Addendum)
Subjective:  No cos ,just had shower /tolerated hd yest. On schedule   Objective Vital signs in last 24 hours: Vitals:   06/02/18 0404 06/02/18 0719 06/02/18 1235 06/02/18 1535  BP: (!) 104/42 100/89 (!) 97/52 (!) 126/43  Pulse: 63 63 62 (!) 58  Resp: (!) 24 15 18 17   Temp: (!) 97.4 F (36.3 C) 98.4 F (36.9 C) 97.8 F (36.6 C) (!) 97.5 F (36.4 C)  TempSrc: Oral Oral    SpO2: 99% (!) 66%    Weight: 68.2 kg     Height:       Weight change: -2.6 kg  Physical Exam: General: alert , calm ,thin, Male ,no apparent distress Heart: RRR, 3/6 SEM, No rub or gallop Lungs: CTA bilat  Abdomen: bs pos. , soft , nt, nd Extremities: Bipedal  Dialysis Access: pos bruit LUA AVF   Dialysis Orders:  TTS [facility] 3.5h   66kg   2K/2.5Ca  P4   LUE AVG   Hep none -Hectorol 3 mcg IV TIW -Venofer 50mg  IV q weeks  -Mircera IV q 2 weeks last 8/20  -Ca acetate 4 tid qac--> stopped while hospitalized in light of hyperCa  Problem/Plan: 1. NSTEMI- LHC severe CAD, HF  rEF (ef 20-25%) severe calcified AoV/   SP PTCA  Of mid LAD with Orbital Arthrectomy/Stenting Mid LAD. TAVR for next TUES. 2. ESRD - HD TTS , hd tomor. 3. HTN/volume / EF 20-25 % CM - EDW challenge as tolerated, some bps low and asymp. 4. 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 Secondary hyperparathyroidism -Binder  Renvela 800mg  2 ac  Phos 9 to 6  Reck pre hd in am , Correc Ca high on admit  Phoslo dc / hold vit d  6. HIV- Meds  7. Nutrition = Alb 2.9  Renal ,carb mod diet, pro-stat supplement    Lenny Pastel, PA-C [facility] Beeper [phone] 06/02/2018,3:54 PM  LOS: 12 days   Pt seen, examined and agree w A/P as above.  Vinson Moselle MD [facility] pager [phone]   06/02/2018, 4:42 PM    Labs: Basic Metabolic Panel: Recent Labs  Lab 05/29/18 0840 05/31/18 0457 06/26/2018 0715 06/02/18 0405  NA 136 139 141  139  K 4.6 3.5 3.8 5.8*  CL 101 99 101 96*  CO2 22 27 29 23   GLUCOSE 164* 87 84 126*  BUN 43* 43* 31* 51*  CREATININE 6.75* 6.98* 5.47* 6.87*  CALCIUM 9.3 9.6 9.3 9.5  PHOS 6.8*  --   --   --    Liver Function Tests: Recent Labs  Lab 05/29/18 0840  ALBUMIN 2.9*   No results for input(s): LIPASE, AMYLASE in the last 168 hours. No results for input(s): AMMONIA in the last 168 hours. CBC: Recent Labs  Lab 05/27/18 0528 05/29/18 0840 05/31/18 0457 06/05/2018 0715 06/02/18 0655  WBC 9.3 6.2 7.3 6.7 6.6  NEUTROABS  --   --  3.3  --   --   HGB 10.9* 10.9* 11.2* 10.6* 11.5*  HCT 31.8* 34.9* 35.4* 33.6* 36.7*  MCV 104.6* 110.1* 110.3* 110.2* 110.5*  PLT 65* 94* 92* 95* 96*   Cardiac Enzymes: No results for input(s): CKTOTAL, CKMB, CKMBINDEX, TROPONINI in the last 168 hours. CBG: Recent Labs  Lab 06/02/18 1528  GLUCAP 118*    Studies/Results: No results found. Medications: . sodium  chloride    . sodium chloride     . sodium chloride   Intravenous Once  . angioplasty book   Does not apply Once  . aspirin  81 mg Oral Daily  . clopidogrel  75 mg Oral Daily  . darunavir  800 mg Oral Q supper  . dolutegravir  50 mg Oral Q supper  . enoxaparin (LOVENOX) injection  30 mg Subcutaneous Q24H  . feeding supplement (PRO-STAT SUGAR FREE 64)  30 mL Oral BID  . gabapentin  100 mg Oral Daily  . metoprolol tartrate  12.5 mg Oral BID  . pantoprazole  40 mg Oral BID  . ritonavir  100 mg Oral Q supper  . rosuvastatin  5 mg Oral q1800  . sevelamer carbonate  1,600 mg Oral TID WC  . sodium chloride flush  3 mL Intravenous Q12H  . sodium chloride flush  3 mL Intravenous Q12H  . tenofovir  300 mg Oral Weekly

## 2018-06-02 NOTE — Progress Notes (Signed)
Family Medicine Teaching Service Daily Progress Note Intern Pager: [phone]  Patient name: [name] Medical record number: [id number] Date of birth: [birth year] Age: 52 y.o. Gender: male  Primary Care Provider: Patient, No Pcp Per Consultants: Cards Code Status: Full   Pt Overview and Major Events to Date:  8/23 Admit to FPTS for NSTEMI with TTE showing EF 20-25%, started on heparin and nitro drip 8/26 Cardiac cath 8/30 TEE, CTA  Assessment and Plan: Caleb Reali Cunninghamis a 52 y.o.malepresenting with chest pain and feeling unwell found to have NSTEMI. PMH is significant forHIV, ESRD, HSV, anemia, peripheral neuropathy, anemia, GI bleed, PUD, AS.  Inferolateral NSTEMI: Denies CP or SOB, does endorse some mild fatigue following surgery. Successful angioplasty of the mid LAD with orbital atherectomy. Planning for TAVR likely next Tuesday. Cardiology recommending continued DAPT for minimum of 12 months. EKG NSR with RBBB and bilateral fascicular block.    -F/u cards recs  -Continue aspirin, Lopressor, Crestor  --Increase Crestor to 10mg    --No Ace/arb d/t lower BP  --Recommending switching BB to Toprol XL 25mg  at discharge per cards  --Cont plavix 75mg  daily  -Telemetry and continuous pulse ox   Hyperkalemia: K 5.8 this am, from 3.8 yesterday. Pt asymptomatic, however is more tired today. Consider reperfusion injury vs need for hemodialysis (last 9/2). EKG this am without peaked T waves.  -Insulin 10 units with D50 amp  -Discuss with nephrology for dialysis  -Recheck K this afternoon   ESRD on HD: Chronic.  T/Th/Sa schedule.  -continue HD on current schedule; However last received 9/2 d/t procedure  -Nephrology following, appreciate recs   Hypertension:Stable. Softer pressures-normotensive overnight. -SwitchLopressor 12.5 mg BID to Toprol XL 25mg  daily at discharge  -monitor vitals and clinical symptomatolgy  Borderline Lymphadenopathy: Borderline  enlarged in mediastinum (1.3/1cm) and retroperitoneal nodes (1cm) on CT 8/30, similar to previous CT in 2017. Thought to be likely 2/2 chronic lymphatic congestion or inflammation, however could not r/o CLL via imaging. No intervention at this time given stability and no further signs or symptoms concerning for CLL.    HIV: Chronic. Well-controlled. Last CD4 780, viral load undetectable. -Continue tivicay and prezista  Post herpetic lesion: No complaints of pain this am. -continuehomegabapentin 100mg  daily -monitor pain  HLD: Chronic LFT's stable, no interaction with current HIV medications he is on.  - Increase crestor to 10mg  daily  HSV: Chronic, stable On Acyclovir prn. No current outbreak. - consider acyclovir prn if symptoms arise during hospitalization   PUD: Stable On protonix 40mg  BID at home. - cont home protonix - cont to monitor CBC  Anemia: Chronic, stable Likely related to CKD. Hgbstable at 11.5. - cont to monitor CBC  Peripheral Neuropathy: Chronic Symptoms unchanged. Last Vitamin B12 12/2016, 332. - cont to monitor   Pterygium OU: Chronic No change in symptoms, has seen ophtho in the past. - cont artificial tears prn  Tobacco Abuse Current 0.5 ppd smoker. - encourage tobacco cessation - consider nicotine patch prn  FEN/GI: renal/fluid restricted PPx: lovenox   Disposition: S/p PCI 9/3, continued care   Subjective:  Doing well this am. Denies any CP, lightheaded, dizziness, leg cramping, or SOB. He does state he feels a little more tired and notes a little dyspnea with walking, but states "its not that bad." Had a BM yesterday.   Objective: Temp:  [97 F (36.1 C)-97.6 F (36.4 C)] 97.4 F (36.3 C) (09/04 0404) Pulse Rate:  [61-75] 63 (09/04 0404) Resp:  [7-32] 24 (09/04  0404) BP: (71-149)/(42-99) 104/42 (09/04 0404) SpO2:  [0 %-100 %] 99 % (09/04 0404) Weight:  [68.2 kg] 68.2 kg (09/04 0404) Physical Exam: General: Alert, NAD,  sitting up eating breakfast  HEENT: NCAT, MMM Cardiac: RRR 2-3 grade systolic murmur  Lungs: Clear bilaterally, no increased WOB  Abdomen: soft, non-tender, non-distended, normoactive BS Msk: Moves all extremities spontaneously  Ext: Warm, dry, 2+ distal pulses, 1+ pitting edema BLE     Laboratory: Recent Labs  Lab 05/29/18 0840 05/31/18 0457 06/13/2018 0715  WBC 6.2 7.3 6.7  HGB 10.9* 11.2* 10.6*  HCT 34.9* 35.4* 33.6*  PLT 94* 92* 95*   Recent Labs  Lab 05/31/18 0457 06/27/2018 0715 06/02/18 0405  NA 139 141 139  K 3.5 3.8 5.8*  CL 99 101 96*  CO2 27 29 23   BUN 43* 31* 51*  CREATININE 6.98* 5.47* 6.87*  CALCIUM 9.6 9.3 9.5  GLUCOSE 87 84 126*     Imaging/Diagnostic Tests: No results found.  Allayne Stack, DO 06/02/2018, 6:31 AM PGY-1, [place] [facility] FPTS Intern pager: [phone], text pages welcome

## 2018-06-02 NOTE — Care Management Note (Signed)
Case Management Note  Patient Details  Name: [name] MRN: [id number] Date of Birth: [birth year]  Subjective/Objective:  Patient chose [facility] for HHPT, [facility], referral given to [facility] with [facility] .  Soc will begin 24-48 hrs post dc.  Patient is plan to have a TAVR next Tuesday. No PCP listed, he will need a PCP.                  Action/Plan: NCM will follow for transition of care needs.   Expected Discharge Date:                  Expected Discharge Plan:  Home w Home Health Services  In-House Referral:  NA  Discharge planning Services  CM Consult  Post Acute Care Choice:  Home Health Choice offered to:  Patient  DME Arranged:    DME Agency:     HH Arranged:  RN, Disease Management, PT HH Agency:  [facility]  Status of Service:  In process, will continue to follow  If discussed at Long Length of Stay Meetings, dates discussed:    Additional Comments:  Leone Haven, RN 06/02/2018, 11:29 AM

## 2018-06-02 NOTE — Progress Notes (Addendum)
Progress Note  Patient Name: [name] Date of Encounter: 06/02/2018  Primary Cardiologist: Thurmon Fair, MD   Subjective   R groin sore, no CP or SOB, asks about the surgery  Inpatient Medications    Scheduled Meds: . sodium chloride   Intravenous Once  . angioplasty book   Does not apply Once  . aspirin  81 mg Oral Daily  . clopidogrel  75 mg Oral Daily  . darunavir  800 mg Oral Q supper  . dolutegravir  50 mg Oral Q supper  . enoxaparin (LOVENOX) injection  30 mg Subcutaneous Q24H  . feeding supplement (PRO-STAT SUGAR FREE 64)  30 mL Oral BID  . gabapentin  100 mg Oral Daily  . metoprolol tartrate  12.5 mg Oral BID  . pantoprazole  40 mg Oral BID  . ritonavir  100 mg Oral Q supper  . rosuvastatin  5 mg Oral q1800  . sevelamer carbonate  1,600 mg Oral TID WC  . sodium chloride flush  3 mL Intravenous Q12H  . sodium chloride flush  3 mL Intravenous Q12H  . tenofovir  300 mg Oral Weekly   Continuous Infusions: . sodium chloride    . sodium chloride     PRN Meds: sodium chloride, sodium chloride, acetaminophen, morphine injection, nitroGLYCERIN, ondansetron (ZOFRAN) IV, ondansetron (ZOFRAN) IV, oxyCODONE-acetaminophen, polyvinyl alcohol, sodium chloride flush, sodium chloride flush   Vital Signs    Vitals:   05/31/2018 1830 06/27/2018 2000 06/04/2018 2106 06/02/18 0404  BP: 101/74 96/72 102/74 (!) 104/42  Pulse:  67 69 63  Resp: 19 (!) 24  (!) 24  Temp:  (!) 97.4 F (36.3 C)  (!) 97.4 F (36.3 C)  TempSrc:  Oral  Oral  SpO2:    99%  Weight:    68.2 kg  Height:        Intake/Output Summary (Last 24 hours) at 06/02/2018 0710 Last data filed at 06/02/2018 0300 Gross per 24 hour  Intake 480 ml  Output 0 ml  Net 480 ml   Filed Weights   05/31/18 1215 06/12/2018 0500 06/02/18 0404  Weight: 67.9 kg 67.1 kg 68.2 kg    Telemetry    SR, regularly occurring PVCs and one 3-bt run NSVT - Personally Reviewed   Physical Exam   General: Well developed,  well nourished, male in no acute distress Head: Eyes PERRLA, No xanthomas.   Normocephalic and atraumatic Lungs: Clear bilaterally to auscultation. Heart: HRRR S1 S2, without RG. 3/6 SEM. Pulses are 2+ & equal. No JVD. Abdomen: Bowel sounds are present, abdomen soft and non-tender without masses or  hernias noted. Msk: Normal strength and tone for age. Extremities: No clubbing, cyanosis or edema.  R groin cath site w/out ecchymosis or hematoma Skin:  No rashes or lesions noted. Neuro: Alert and oriented X 3. Psych:  Good affect, responds appropriately    Labs    Chemistry Recent Labs  Lab 05/29/18 0840 05/31/18 0457 06/12/2018 0715 06/02/18 0405  NA 136 139 141 139  K 4.6 3.5 3.8 5.8*  CL 101 99 101 96*  CO2 22 27 29 23   GLUCOSE 164* 87 84 126*  BUN 43* 43* 31* 51*  CREATININE 6.75* 6.98* 5.47* 6.87*  CALCIUM 9.3 9.6 9.3 9.5  ALBUMIN 2.9*  --   --   --   GFRNONAA 9* 8* 11* 8*  GFRAA 10* 10* 13* 10*  ANIONGAP 13 13 11  20*     Hematology Recent Labs  Lab 05/29/18  0840 05/31/18 0457 06/16/2018 0715  WBC 6.2 7.3 6.7  RBC 3.17* 3.21* 3.05*  HGB 10.9* 11.2* 10.6*  HCT 34.9* 35.4* 33.6*  MCV 110.1* 110.3* 110.2*  MCH 34.4* 34.9* 34.8*  MCHC 31.2 31.6 31.5  RDW 19.1* 18.4* 18.5*  PLT 94* 92* 95*   CARDIAC CATH/PCI: 06/13/2018  Prox LAD lesion is 99% stenosed.  Post intervention, there is a 0% residual stenosis.  A drug-eluting stent was successfully placed.   1. Severe, heavily calcified stenosis in the mid LAD 2. Successful PTCA of the mid LAD with orbital atherectomy and stenting of the mid LAD.   Recommendations: Will continue DAPT with ASA and Plavix. Will continue planning for TAVR.  Recommend uninterrupted dual antiplatelet therapy with Aspirin 81mg  daily and Clopidogrel 75mg  daily for a minimum of 12 months (ACS - Class I recommendation). Post-Intervention Diagram       Patient Profile     52 y.o. male with severe CAD (primarily high-grade stenosis  calcified proximal LAD), low gradient severe aortic stenosis, moderate to severe aortic insufficiency, end-stage renal disease on hemodialysis for almost 20 years, HIV+,undergoing work-up for aortic valve replacement.  Assessment & Plan    1 coronary artery disease- s/p PCI LAD, Continue ASA/Plavix, med rx for OM1 90% and other non-obs dz   2 aortic stenosis/aortic insufficiency- has had CT/TEE needed before TAVR, will contact the TAVR team to establish the plan  3 end-stage renal disease- HD per Nephrology  4 cardiomyopathy- change BB to Toprol XL  25 mg, BP borderline so no ACE/ARB. Recheck echo as outpt  5 HIV- per IM  For questions or updates, please contact [facility] Please consult [email or web address] for contact info under Cardiology/STEMI.      Signed, Theodore Demark, PA-C  06/02/2018, 7:10 AM   As above, patient seen and examined.  He denies chest pain this morning.  Right groin shows no hematoma and no bruit.  Plan to continue medical therapy status post PCI of LAD.  Continue aspirin, Plavix and statin.  We will await final arrangements concerning timing of TAVR.  At discharge would change metoprolol to Toprol 25 mg daily.  No ACE inhibitor or ARB as blood pressure borderline.  Repeat echocardiogram 3 months after aortic valve replacement to see if LV function has improved. ESRD and hyperkalemia per nephrology and primary care. Olga Millers, MD

## 2018-06-03 ENCOUNTER — Other Ambulatory Visit: Payer: Self-pay

## 2018-06-03 LAB — RENAL FUNCTION PANEL
Albumin: 3.4 g/dL — ABNORMAL LOW (ref 3.5–5.0)
Anion gap: 20 — ABNORMAL HIGH (ref 5–15)
BUN: 77 mg/dL — ABNORMAL HIGH (ref 6–20)
CALCIUM: 9.3 mg/dL (ref 8.9–10.3)
CO2: 23 mmol/L (ref 22–32)
CREATININE: 7.93 mg/dL — AB (ref 0.61–1.24)
Chloride: 94 mmol/L — ABNORMAL LOW (ref 98–111)
GFR, EST AFRICAN AMERICAN: 8 mL/min — AB (ref >60)
GFR, EST NON AFRICAN AMERICAN: 7 mL/min — AB (ref >60)
Glucose, Bld: 98 mg/dL (ref 70–99)
Phosphorus: 8.6 mg/dL — ABNORMAL HIGH (ref 2.5–4.6)
Potassium: 6.5 mmol/L (ref 3.5–5.1)
SODIUM: 137 mmol/L (ref 135–145)

## 2018-06-03 LAB — CBC
HCT: 34.1 % — ABNORMAL LOW (ref 39.0–52.0)
Hemoglobin: 10.9 g/dL — ABNORMAL LOW (ref 13.0–17.0)
MCH: 34.4 pg — AB (ref 26.0–34.0)
MCHC: 32 g/dL (ref 30.0–36.0)
MCV: 107.6 fL — ABNORMAL HIGH (ref 78.0–100.0)
PLATELETS: 112 10*3/uL — AB (ref 150–400)
RBC: 3.17 MIL/uL — AB (ref 4.22–5.81)
RDW: 18.5 % — ABNORMAL HIGH (ref 11.5–15.5)
WBC: 6.1 10*3/uL (ref 4.0–10.5)

## 2018-06-03 LAB — POTASSIUM: POTASSIUM: 3.8 mmol/L (ref 3.5–5.1)

## 2018-06-03 MED ORDER — LOPERAMIDE HCL 2 MG PO CAPS
2.0000 mg | ORAL_CAPSULE | Freq: Once | ORAL | Status: AC
  Administered 2018-06-03: 2 mg via ORAL

## 2018-06-03 MED ORDER — TIOTROPIUM BROMIDE MONOHYDRATE 18 MCG IN CAPS
18.0000 ug | ORAL_CAPSULE | Freq: Every day | RESPIRATORY_TRACT | Status: DC
  Administered 2018-06-04 – 2018-06-06 (×3): 18 ug via RESPIRATORY_TRACT
  Filled 2018-06-03 (×2): qty 5

## 2018-06-03 MED ORDER — METOPROLOL SUCCINATE ER 25 MG PO TB24
25.0000 mg | ORAL_TABLET | Freq: Every day | ORAL | Status: DC
  Administered 2018-06-03 – 2018-06-06 (×2): 25 mg via ORAL
  Filled 2018-06-03 (×4): qty 1

## 2018-06-03 NOTE — Plan of Care (Signed)
  Problem: Health Behavior/Discharge Planning: Goal: Ability to manage health-related needs will improve Outcome: Progressing   Problem: Health Behavior/Discharge Planning: Goal: Ability to safely manage health-related needs after discharge will improve Outcome: Progressing

## 2018-06-03 NOTE — Progress Notes (Addendum)
Family Medicine Teaching Service Daily Progress Note Intern Pager: [phone]  Patient name: [name] Medical record number: [id number] Date of birth: [birth year] Age: 52 y.o. Gender: male  Primary Care Provider: Patient, No Pcp Per Consultants: Cards Code Status: Full   Pt Overview and Major Events to Date:  8/23 Admit to FPTS for NSTEMI with TTE showing EF 20-25%, started on heparin and nitro drip 8/26 Cardiac cath 8/30 TEE, CTA  Assessment and Plan: Caleb Smigielski Cunninghamis a 52 y.o.malepresenting with chest pain and feeling unwell found to have NSTEMI. PMH is significant forHIV, ESRD, HSV, anemia, peripheral neuropathy, anemia, GI bleed, PUD, AS.  Inferolateral NSTEMI: No CP or SOB this am. POD #2 angioplasty of the mid LAD with orbital atherectomy with drug eluting stent. Planning for TAVR likely next Tuesday. Will be on DAPT for minimum of 12 months and recommending continued inpatient care until TAVR.   -F/u cards recs  -Continue aspirin, Lopressor, Crestor    --No Ace/arb d/t lower BP  --Recommending switching BB to Toprol XL 25mg  at discharge per cards  --Cont plavix 75mg  daily  -Telemetry and continuous pulse ox   Hyperkalemia: K 6.5 this am from 5.2 yesterday after insulin/D50. Currently receiving dialysis. EKG without peaked T waves prior to HD.  -HD today  -Monitor BMP   ESRD on HD: Chronic.  T/Th/Sa schedule.  -continue HD on current schedule -Nephrology following, appreciate recs   COPD/RLD: PFT's for pre-TAVR showing moderately severe obstructive airways disease, severe interstitial restriction, and severe diffusion defect. Pt with minimal symptoms, some dyspnea with ambulation after PCI but not prior to.  -consider albuterol inhaler as needed   Hypertension: Softer pressures-normotensive overnight. Low 70's systolic overnight, has intermittently run low during stay.  -SwitchLopressor 12.5 mg BID to Toprol XL 25mg  daily at discharge    Borderline Lymphadenopathy: Borderline enlarged in mediastinum (1.3/1cm) and retroperitoneal nodes (1cm) on CT 8/30, similar to previous CT in 2017. Thought to be likely 2/2 chronic lymphatic congestion or inflammation, however could not r/o CLL via imaging. No intervention at this time given stability and no further signs or symptoms concerning for CLL.    HIV: Chronic. Well-controlled.  -Continue Tivicay -Cont Prezista -Cont Ritonavir   Post herpetic lesion: No complaints.  -continuehomegabapentin 100mg  daily -monitor pain  HLD: Chronic LFT's stable, Minimal interaction with current HIV medications he is on, with close monitoring of his LFTs.  - Crestor 10mg  daily  HSV: Chronic, stable On Acyclovir prn. No current outbreak. - consider acyclovir prn if symptoms arise during hospitalization   PUD: Stable On protonix 40mg  BID at home. - cont home protonix - cont to monitor CBC  Anemia: Chronic, stable Likely related to CKD. Hgbstable at 11.5 on 9/4.  - cont to monitor CBC  Peripheral Neuropathy: Chronic Symptoms unchanged. Last Vitamin B12 12/2016, 332. - cont to monitor   Pterygium OU: Chronic No change in symptoms, has seen ophtho in the past. - cont artificial tears prn  Tobacco Abuse Current 0.5 ppd smoker. - encourage tobacco cessation - consider nicotine patch prn  FEN/GI: renal/fluid restricted PPx: lovenox   Disposition: Continued inpatient are   Subjective:  No acute events overnight. Doing well this morning at dialysis. States his lips are dry, but other wise feeling good currently without CP, SOB, lightheadedness, or dizziness. Did feel a little lightheaded and winded yesterday, "they made me do too much at once after my procedure."    Objective: Temp:  [97.5 F (36.4 C)-99.7 F (37.6 C)]  99.7 F (37.6 C) (09/04 2353) Pulse Rate:  [54-63] 58 (09/05 0458) Resp:  [15-20] 20 (09/05 0446) BP: (76-137)/(42-89) 118/63 (09/05 0446) SpO2:   [66 %-100 %] 100 % (09/05 0458) Weight:  [68.2 kg] 68.2 kg (09/05 0003) Physical Exam: General: Alert, NAD, at dialysis  HEENT: NCAT, MM dry, sclera injected  Cardiac: RRR 3/6 systolic murmur  Lungs: Clear anteriorly, no increased WOB on RA  Abdomen: soft, non-tender, non-distended, normoactive BS Msk: Moves all extremities spontaneously  Ext: Warm, dry, 2+ distal pulses, 1+ pitting edema, LUE AVF    Laboratory: Recent Labs  Lab 05/31/18 0457 06/05/2018 0715 06/02/18 0655  WBC 7.3 6.7 6.6  HGB 11.2* 10.6* 11.5*  HCT 35.4* 33.6* 36.7*  PLT 92* 95* 96*   Recent Labs  Lab 06/12/2018 0715 06/02/18 0405 06/02/18 1858  NA 141 139 139  K 3.8 5.8* 5.2*  CL 101 96* 95*  CO2 29 23 22   BUN 31* 51* 66*  CREATININE 5.47* 6.87* 7.60*  CALCIUM 9.3 9.5 9.6  GLUCOSE 84 126* 147*     Imaging/Diagnostic Tests: No results found.  Allayne Stack, DO 06/03/2018, 6:11 AM PGY-1, [facility] FPTS Intern pager: [phone], text pages welcome

## 2018-06-03 NOTE — Progress Notes (Addendum)
[facility]   MULTIDISCIPLINARY HEART VALVE TEAM  Patient Name: [name] Date of Encounter: 06/03/2018  Primary Cardiologist: Thurmon Fair, MD   Hospital Problem List     Principal Problem:   Acute on chronic systolic heart failure [name]) Active Problems:   Aortic valve stenosis   HIV (human immunodeficiency virus infection) (HCC)   NSTEMI (non-ST elevated myocardial infarction) (HCC)   ESRD on hemodialysis (HCC)   Aortic insufficiency   Severe aortic stenosis     Subjective   In HD. No complaints. Drowsy.  Inpatient Medications    Scheduled Meds: . sodium chloride   Intravenous Once  . angioplasty book   Does not apply Once  . aspirin  81 mg Oral Daily  . clopidogrel  75 mg Oral Daily  . darunavir  800 mg Oral Q supper  . dolutegravir  50 mg Oral Q supper  . enoxaparin (LOVENOX) injection  30 mg Subcutaneous Q24H  . feeding supplement (PRO-STAT SUGAR FREE 64)  30 mL Oral BID  . gabapentin  100 mg Oral Daily  . metoprolol tartrate  12.5 mg Oral BID  . pantoprazole  40 mg Oral BID  . ritonavir  100 mg Oral Q supper  . rosuvastatin  10 mg Oral q1800  . sevelamer carbonate  1,600 mg Oral TID WC  . sodium chloride flush  3 mL Intravenous Q12H  . sodium chloride flush  3 mL Intravenous Q12H  . tenofovir  300 mg Oral Weekly   Continuous Infusions: . sodium chloride    . sodium chloride    . ferric gluconate (FERRLECIT/NULECIT) IV     PRN Meds: sodium chloride, sodium chloride, acetaminophen, morphine injection, nitroGLYCERIN, ondansetron (ZOFRAN) IV, ondansetron (ZOFRAN) IV, oxyCODONE-acetaminophen, polyvinyl alcohol, sodium chloride flush, sodium chloride flush   Vital Signs    Vitals:   06/03/18 0127 06/03/18 0446 06/03/18 0458 06/03/18 0740  BP: 137/64 118/63  98/70  Pulse: (!) 58 60 (!) 58 68  Resp:  20  20  Temp:    (!) 97.5 F (36.4 C)  TempSrc:    Oral  SpO2:  (!) 70% 100% 100%  Weight:      Height:         Intake/Output Summary (Last 24 hours) at 06/03/2018 0823 Last data filed at 06/03/2018 0555 Gross per 24 hour  Intake 120 ml  Output -  Net 120 ml   Filed Weights   06/06/2018 0500 06/02/18 0404 06/03/18 0003  Weight: 67.1 kg 68.2 kg 68.2 kg    Physical Exam   GEN: thin, chronically ill appearing AA male, drowsy HEENT: Grossly normal.  Neck: Supple, no JVD, carotid bruits, or masses. Cardiac: RRR, 3/6 SEM. No rubs, or gallops. No clubbing, cyanosis, edema.  Radials/DP/PT 2+ and equal bilaterally.  Respiratory:  Respirations regular and unlabored, clear to auscultation bilaterally. GI: Soft, nontender, nondistended, BS + x 4. MS: no deformity or atrophy. Skin: warm and dry, no rash. Neuro:  Strength and sensation are intact. Psych: AAOx3.  Normal affect.  Labs    CBC Recent Labs    06/11/2018 0715 06/02/18 0655  WBC 6.7 6.6  HGB 10.6* 11.5*  HCT 33.6* 36.7*  MCV 110.2* 110.5*  PLT 95* 96*   Basic Metabolic Panel Recent Labs    16/10/96 1858 06/03/18 0359  NA 139 137  K 5.2* 6.5*  CL 95* 94*  CO2 22 23  GLUCOSE 147* 98  BUN 66* 77*  CREATININE 7.60* 7.93*  CALCIUM  9.6 9.3  PHOS  --  8.6*   Liver Function Tests Recent Labs    06/03/18 0359  ALBUMIN 3.4*   No results for input(s): LIPASE, AMYLASE in the last 72 hours. Cardiac Enzymes No results for input(s): CKTOTAL, CKMB, CKMBINDEX, TROPONINI in the last 72 hours. BNP Invalid input(s): POCBNP D-Dimer No results for input(s): DDIMER in the last 72 hours. Hemoglobin A1C No results for input(s): HGBA1C in the last 72 hours. Fasting Lipid Panel No results for input(s): CHOL, HDL, LDLCALC, TRIG, CHOLHDL, LDLDIRECT in the last 72 hours. Thyroid Function Tests No results for input(s): TSH, T4TOTAL, T3FREE, THYROIDAB in the last 72 hours.  Invalid input(s): FREET3  Telemetry    Sinus on HD tele - Personally Reviewed  ECG    Sinus with RBBB - Personally Reviewed  Radiology    No results  found.  Cardiac Studies   2D ECHO 05/23/18 Study Conclusions - Left ventricle: The cavity size was normal. Wall thickness was increased in a pattern of mild LVH. Systolic function was severely reduced. The estimated ejection fraction was in the range of 20% to 25%. Diffuse hypokinesis. Features are consistent with a pseudonormal left ventricular filling pattern, with concomitant abnormal relaxation and increased filling pressure (grade 2 diastolic dysfunction). Doppler parameters are consistent with high ventricular filling pressure. - Aortic valve: Moderately to severely calcified annulus. Severely thickened leaflets. There is low flow low gradient severe aortic stenosis based on available data. The CW Doppler profile of the AV is very limited, the true gradient may be higher than reported. There was moderate regurgitation. Mean gradient (S): 18 mm Hg. VTI ratio of LVOT to aortic valve: 0.25. Valve area (VTI): 0.78 cm^2. Valve area (Vmax): 0.81 cm^2. Valve area (Vmean): 0.75 cm^2. - Mitral valve: Moderately calcified annulus. Mildly thickened leaflets . There was mild regurgitation. - Left atrium: The atrium was severely dilated. - Right ventricle: The cavity size was mildly dilated. Systolic function was mildly to moderately reduced. - Right atrium: The atrium was moderately dilated. - Atrial septum: No defect or patent foramen ovale was identified  ________________   05/22/2018 RIGHT/LEFT HEART CATH AND CORONARY ANGIOGRAPHY  Conclusion    1st Diag lesion is 100% stenosed.  Prox LAD to Mid LAD lesion is 95% stenosed.  2nd Diag lesion is 99% stenosed.  1st Mrg lesion is 90% stenosed.  Prox RCA to Mid RCA lesion is 20% stenosed.  Mid RCA lesion is 20% stenosed.  Evidence for coronary calcification with total occlusion of an inferior branch of the first diagonal vessel, 95% LAD stenosis after the septal perforating artery and at the  takeoff of a diffusely diseased second diagonal branch in a large LAD vessel which wraps around the apex; a ramus intermediate vessel which bifurcates into 2 branches which has 30% narrowing in the inferior branch and 90% narrowing in the mid superior branch; normal AV groove circumflex vessel; dominant RCA with 20% proximal and 20% mid stenoses with mid distal 95% stenosis in an anterior RV marginal branch (not able to be shown on the diagram).  Significant elevation right heart pressures with moderately severe pulmonary hypertension.  Low gradient at least moderate aortic valve stenosis with a mean gradient of 18 mm Hg, peak to peak gradient of 14 mm Hg, and peak instantaneous gradient of 28 mm. Aortic valve area of 1.2 to 1.3 cm.  Previous echo Doppler ejection fraction 20 to 25% not assessed during this catheterization due to the markedly elevated LVEDP.  RECOMMENDATION: Patient  will be undergoing dialysis with his volume overload. Will review angiogram with colleagues. Options include possible PCI to the LAD versus surgical assessment of candidacy of AVR/CABG.  Recommend Aspirin 81mg  daily for moderate CAD.If surgical option is negative institute dual antiplatelet therapy with plan for LAD PCI.    TEE 05/29/2018 Study Conclusions - Left ventricle: The cavity size was moderately dilated. Systolic   function was severely reduced. The estimated ejection fraction   was in the range of 15% to 20%. Wall motion was normal; there   were no regional wall motion abnormalities. - Aortic valve: Trileaflet; severely thickened, moderately   calcified leaflets; fusion of the left-noncoronary commissure.   Cusp separation was reduced. Valve mobility was restricted. There   was moderate stenosis. There was moderate to severe   regurgitation. Peak velocity (S): 275 cm/s. Mean gradient (S): 19   mm Hg. Valve area (VTI): 1.2 cm^2. Valve area (Vmax): 1.58 cm^2.   Valve area (Vmean): 1.5 cm^2. Valve  area by planimetry 0.79 cm^2. - Mitral valve: There was mild regurgitation. - Left atrium: No evidence of thrombus in the atrial cavity or   appendage. No evidence of thrombus in the atrial cavity or   appendage. - Right ventricle: The cavity size was normal. Wall thickness was   normal. Systolic function was severely reduced. - Right atrium: No evidence of thrombus in the atrial cavity or   appendage. Impressions: - Aortic stenosis is severe by planimetry but moderate by mean   gradient and peak velocity. Given the reduced systolic function   and severely calcified, consider low-flow, low-gradient severe   aortic stenosis.    CARDIAC CATH/PCI: 06/13/2018  Prox LAD lesion is 99% stenosed.  Post intervention, there is a 0% residual stenosis.  A drug-eluting stent was successfully placed.  1. Severe, heavily calcified stenosis in the mid LAD 2. Successful PTCA of the mid LAD with orbital atherectomy and stenting of the mid LAD.   Recommendations: Will continue DAPT with ASA and Plavix. Will continue planning for TAVR.  Recommend uninterrupted dual antiplatelet therapy with Aspirin 81mg  daily and Clopidogrel 75mg  dailyfor a minimum of 12 months (ACS - Class I recommendation). Post-Intervention Diagram         Patient Profile     [name] is a 52 y.o. male with a hx of ESRD on HD (T,T,S), HIV, syphilis, herpes, HTN, anemia, thrombocytopenia, protein calorie malnutrition, COPD w/ ongoing tobacco abuse, CAD, chronic systolic CHF, pulmonary HTN, and moderate to severe AS/AI who presented to [facility] on 05/07/2018 with chest pain and found to have NSTEMI. He was admitted for treatment and further work up. Echo this admission showed new LV dysfunction (20-25%) and low flow, low gradient AS/AI.  L/RHC showed severe calcific CAD with 99% prox LAD lesion requiring revascularization and significantly elevated right heart pressures with pulm htn. The structural heart team was  consulted for evaluation of PCI + TAVR.  Assessment & Plan   Severe AS/AI: pre TAVR work up completed and surgery booked for next Tuesday. Given complexity of medical history and need for HD, it may be best to keep him inpatient until TAVR next week. Will discuss with Dr. Excell Seltzer  CAD with NSTEMI: peak troponin 1.43. s/p successful PTCA of the mid LAD with orbital atherectomy and stenting of the mid LAD this admission. Continue ASA/Plavix which will need to be continued at least 12 months   ESRD: HD T, T, Sat. Per Nephrology  A/C Systolic CHF: volume management per  nephrology. BB changed to Toprol XL 25 mg, BP borderline so no ACE/ARB.   Protein calorie malnutrition: albumin 2.9. On protein supplementation     Hyperkalemia: per primary team.   COPD: PFTs showed moderately severe airway obstruction w/ diffusion defect suggesting an interstitial process such as fibrosis or interstitial inflammation.  SignedCline Crock, PA-C  06/03/2018, 8:23 AM  Pager [phone]  Patient seen, examined. Available data reviewed. Agree with findings, assessment, and plan as outlined by Carlean Jews, PA-C.  The patient is independently interviewed and examined.  His wife is at the bedside.  He is alert and oriented male, in no distress.  The patient is very thin.  Lung fields are clear, JVP is normal, heart is regular rate and rhythm with a grade 3/6 mid peaking harsh systolic murmur at the right upper sternal border with a soft diastolic decrescendo murmur at the right upper sternal border.  Lower extremities have 1+ pretibial edema.  I have personally reviewed the patient's imaging studies including has echocardiogram, transesophageal echocardiogram, and CTA studies of the heart as well as the chest/abdomen/pelvis.  The patient has progressive symptoms of New York Heart Association functional class III chronic systolic heart failure with severely depressed LV systolic function.  He is undergone PCI of  critical stenosis in the LAD by Dr. Clifton James.  He has severe aortic valve disease with both aortic stenosis and insufficiency.  I agree that TAVR is indicated for treatment of his aortic valve stenosis and insufficiency.  He is not a candidate for conventional heart surgery with multiple severe comorbid medical conditions including protein calorie malnutrition, end-stage renal disease, and severe LV dysfunction.  He will remain on aspirin and clopidogrel.  We anticipate proceeding with transfemoral TAVR next Tuesday.  The patient's social circumstances are also challenging as he is homeless and the case manager is working with him and his wife.  Following the decision to proceed with transcatheter aortic valve replacement, a discussion has been held regarding what types of management strategies would be attempted intraoperatively in the event of life-threatening complications, including whether or not the patient would be considered a candidate for the use of cardiopulmonary bypass and/or conversion to open sternotomy for attempted surgical intervention.  The patient has been advised of a variety of complications that might develop including but not limited to risks of death, stroke, paravalvular leak, aortic dissection or other major vascular complications, aortic annulus rupture, device embolization, cardiac rupture or perforation, mitral regurgitation, acute myocardial infarction, arrhythmia, heart block or bradycardia requiring permanent pacemaker placement, congestive heart failure, respiratory failure, renal failure, pneumonia, infection, other late complications related to structural valve deterioration or migration, or other complications that might ultimately cause a temporary or permanent loss of functional independence or other long term morbidity.  The patient provides full informed consent for the procedure as described and all questions were answered.  Tonny Bollman, M.D. 06/03/2018 3:48 PM

## 2018-06-03 NOTE — Progress Notes (Signed)
Paged Dr Dareen Piano notifying critical K=6.5.. Still waiting for callback

## 2018-06-03 NOTE — Progress Notes (Signed)
Subjective:  K+ high today, said he had some outside food from friends that was brought in one time  Objective Vital signs in last 24 hours: Vitals:   06/03/18 1000 06/03/18 1030 06/03/18 1100 06/03/18 1130  BP: (!) 129/44 (!) 108/27 (!) 122/42 (!) 98/48  Pulse: 66 71 68 70  Resp:      Temp:      TempSrc:      SpO2:      Weight:      Height:       Weight change: 0.021 kg  Physical Exam: General: alert , calm ,thin, Male ,no apparent distress Heart: RRR, 3/6 SEM, No rub or gallop Lungs: CTA bilat  Abdomen: bs pos. , soft , nt, nd Extremities: no edema Dialysis Access: pos bruit LUA AVF   Dialysis Orders:  TTS [facility] 3.5h   66kg   2K/2.5Ca  P4   LUE AVG   Hep none -Hectorol 3 mcg IV TIW -Venofer 50mg  IV q weeks  -Mircera IV q 2 weeks last 8/20  -Ca acetate 4 tid qac--> stopped while hospitalized in light of hyperCa  Problem/Plan: 1. NSTEMI- LHC severe CAD, HFrEF (ef 20-25%) severe calcified AoV/   SP PTCA to mid LAD with Orbital Arthrectomy/Stenting. TAVR for next TUES. 2. ESRD - HD TTS. HD today 3. HTN/volume / EF 20-25 % CM - UF to dry wt today as tol  4. Anemia of ESRD- hgb > 10 here.    Aranesp   weelky hd bNut held with hgb >11, follow up trend/ weekly fe on thurs hd  5. Secondary hyperparathyroidism -Binder  Renvela 800mg  2 ac  Phos 9 to 6  Reck pre hd in am , Correc Ca high on admit  Phoslo dc / hold vit d  6. HIV- Meds  7. Nutrition = Alb 2.9  Renal ,carb mod diet, pro-stat supplement     Caleb Moselle MD [facility] pager [phone]   06/03/2018, 12:00 PM    Labs: Basic Metabolic Panel: Recent Labs  Lab 05/29/18 0840  06/02/18 0405 06/02/18 1858 06/03/18 0359  NA 136   < > 139 139 137  K 4.6   < > 5.8* 5.2* 6.5*  CL 101   < > 96* 95* 94*  CO2 22   < > 23 22 23   GLUCOSE 164*   < > 126* 147* 98  BUN 43*   < > 51* 66* 77*  CREATININE 6.75*   < > 6.87* 7.60* 7.93*  CALCIUM 9.3   < > 9.5 9.6 9.3  PHOS 6.8*  --   --   --   8.6*   < > = values in this interval not displayed.   Liver Function Tests: Recent Labs  Lab 05/29/18 0840 06/03/18 0359  ALBUMIN 2.9* 3.4*   No results for input(s): LIPASE, AMYLASE in the last 168 hours. No results for input(s): AMMONIA in the last 168 hours. CBC: Recent Labs  Lab 05/29/18 0840 05/31/18 0457 06/15/2018 0715 06/02/18 0655 06/03/18 0359  WBC 6.2 7.3 6.7 6.6 6.1  NEUTROABS  --  3.3  --   --   --   HGB 10.9* 11.2* 10.6* 11.5* 10.9*  HCT 34.9* 35.4* 33.6* 36.7* 34.1*  MCV 110.1* 110.3* 110.2* 110.5* 107.6*  PLT 94* 92* 95* 96* 112*   Cardiac Enzymes: No results for input(s): CKTOTAL, CKMB, CKMBINDEX, TROPONINI in the last 168 hours. CBG: Recent Labs  Lab 06/02/18 1528 06/02/18 1637  GLUCAP 118*  143*    Studies/Results: No results found. Medications: . sodium chloride    . sodium chloride    . ferric gluconate (FERRLECIT/NULECIT) IV 62.5 mg (06/03/18 1038)   . sodium chloride   Intravenous Once  . angioplasty book   Does not apply Once  . aspirin  81 mg Oral Daily  . clopidogrel  75 mg Oral Daily  . darunavir  800 mg Oral Q supper  . dolutegravir  50 mg Oral Q supper  . enoxaparin (LOVENOX) injection  30 mg Subcutaneous Q24H  . feeding supplement (PRO-STAT SUGAR FREE 64)  30 mL Oral BID  . gabapentin  100 mg Oral Daily  . metoprolol succinate  25 mg Oral Daily  . pantoprazole  40 mg Oral BID  . ritonavir  100 mg Oral Q supper  . rosuvastatin  10 mg Oral q1800  . sevelamer carbonate  1,600 mg Oral TID WC  . sodium chloride flush  3 mL Intravenous Q12H  . sodium chloride flush  3 mL Intravenous Q12H  . tenofovir  300 mg Oral Weekly  . tiotropium  18 mcg Inhalation Daily

## 2018-06-03 NOTE — Progress Notes (Signed)
Physical Therapy Treatment Patient Details Name: [name] MRN: [id number] DOB: March 04, 1966 Today's Date: 06/03/2018    History of Present Illness 52 y.o. 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  Going for stent placement 06/21/2018.     PT Comments    Pt ambulating hallway tonight 180', less so than prior PT visit. Pt reports fatigue, had HD today, as well has reports he only walks 1-2x a day while here. Encouraged patient to ambulate with staff between our sessions 2-3 times. HR 68-70 this visit. Did not use RW, mild unsteadiness with contact guard for safety.     Follow Up Recommendations  Home health PT;Supervision/Assistance - 24 hour     Equipment Recommendations  Rolling walker with 5" wheels;3in1 (PT)    Recommendations for Other Services       Precautions / Restrictions Precautions Precautions: Fall Restrictions Weight Bearing Restrictions: No    Mobility  Bed Mobility Overal bed mobility: Independent                Transfers Overall transfer level: Independent                  Ambulation/Gait Ambulation/Gait assistance: Supervision;Min guard Gait Distance (Feet): 130 Feet Assistive device: None Gait Pattern/deviations: Step-through pattern;Decreased stride length;Staggering left;Staggering right;Drifts right/left;Shuffle Gait velocity: decreased   General Gait Details: mild unsteadiness, contact guard for safety   Stairs             Wheelchair Mobility    Modified Rankin (Stroke Patients Only)       Balance Overall balance assessment: Needs assistance Sitting-balance support: No upper extremity supported;Feet supported Sitting balance-Leahy Scale: Good     Standing balance support: No upper  extremity supported;During functional activity Standing balance-Leahy Scale: Poor Standing balance comment: Needed assist to steady several times due to poor balance reactions and staggers with dynamic gait.  Cannot stand statically without UE support as well.                             Cognition Arousal/Alertness: Awake/alert Behavior During Therapy: Flat affect Overall Cognitive Status: Within Functional Limits for tasks assessed                                 General Comments: Pt with some decr awareness of safety as he did stagger and he kept stating his balance was good      Exercises      General Comments        Pertinent Vitals/Pain Pain Assessment: No/denies pain    Home Living                      Prior Function            PT Goals (current goals can now be found in the care plan section) Acute Rehab PT Goals Patient Stated Goal: to get better PT Goal Formulation: With patient Time For Goal Achievement: 06/15/18 Potential to Achieve Goals: Good Progress towards PT goals: Progressing toward goals    Frequency    Min 3X/week      PT Plan Current plan remains appropriate    Co-evaluation  AM-PAC PT "6 Clicks" Daily Activity  Outcome Measure  Difficulty turning over in bed (including adjusting bedclothes, sheets and blankets)?: None Difficulty moving from lying on back to sitting on the side of the bed? : None Difficulty sitting down on and standing up from a chair with arms (e.g., wheelchair, bedside commode, etc,.)?: A Little Help needed moving to and from a bed to chair (including a wheelchair)?: A Little Help needed walking in hospital room?: A Lot Help needed climbing 3-5 steps with a railing? : A Lot 6 Click Score: 18    End of Session Equipment Utilized During Treatment: Gait belt Activity Tolerance: Patient limited by fatigue Patient left: in bed;with call bell/phone within reach;with  nursing/sitter in room;with family/visitor present Nurse Communication: Mobility status PT Visit Diagnosis: Unsteadiness on feet (R26.81)     Time: 1750-1810 PT Time Calculation (min) (ACUTE ONLY): 20 min  Charges:  $Gait Training: 8-22 mins                    Etta Grandchild, PT, DPT Acute Rehabilitation Services Pager: [phone] Office: [phone]    Etta Grandchild 06/03/2018, 6:10 PM

## 2018-06-03 NOTE — Care Management Note (Signed)
Case Management Note  Patient Details  Name: [name] MRN: [id number] Date of Birth: 1966/08/06  Action/Plan: CM talked to patient at the bedside with his spouse present. Patient is homeless, pt/ spouse stated that he lives in his car at times, requesting information on shelters; Maralyn Sago SW made aware.  Expected Discharge Date:   undetermined, for Surgery on Tuesday               Expected Discharge Plan:  Home w Home Health Services  In-House Referral:SW Discharge planning Services  CM Consult  Post Acute Care Choice:  Home Health Choice offered to:  Patient  HH Arranged:  RN, Disease Management, PT HH Agency:  [facility]  Status of Service:  In process, will continue to follow  Reola Mosher [phone] 06/03/2018, 2:39 PM

## 2018-06-04 ENCOUNTER — Encounter (HOSPITAL_COMMUNITY): Payer: Medicare Other

## 2018-06-04 LAB — BASIC METABOLIC PANEL
Anion gap: 17 — ABNORMAL HIGH (ref 5–15)
BUN: 42 mg/dL — ABNORMAL HIGH (ref 6–20)
CO2: 23 mmol/L (ref 22–32)
Calcium: 8.8 mg/dL — ABNORMAL LOW (ref 8.9–10.3)
Chloride: 97 mmol/L — ABNORMAL LOW (ref 98–111)
Creatinine, Ser: 5.27 mg/dL — ABNORMAL HIGH (ref 0.61–1.24)
GFR calc Af Amer: 13 mL/min — ABNORMAL LOW (ref >60)
GFR, EST NON AFRICAN AMERICAN: 12 mL/min — AB (ref >60)
Glucose, Bld: 108 mg/dL — ABNORMAL HIGH (ref 70–99)
POTASSIUM: 4 mmol/L (ref 3.5–5.1)
Sodium: 137 mmol/L (ref 135–145)

## 2018-06-04 LAB — CBC WITH DIFFERENTIAL/PLATELET
ABS IMMATURE GRANULOCYTES: 0.1 10*3/uL (ref 0.0–0.1)
BASOS ABS: 0 10*3/uL (ref 0.0–0.1)
BASOS PCT: 0 %
Eosinophils Absolute: 0 10*3/uL (ref 0.0–0.7)
Eosinophils Relative: 0 %
HCT: 34.5 % — ABNORMAL LOW (ref 39.0–52.0)
HEMOGLOBIN: 11 g/dL — AB (ref 13.0–17.0)
Immature Granulocytes: 1 %
LYMPHS PCT: 16 %
Lymphs Abs: 1.3 10*3/uL (ref 0.7–4.0)
MCH: 34.5 pg — ABNORMAL HIGH (ref 26.0–34.0)
MCHC: 31.9 g/dL (ref 30.0–36.0)
MCV: 108.2 fL — ABNORMAL HIGH (ref 78.0–100.0)
Monocytes Absolute: 1.2 10*3/uL — ABNORMAL HIGH (ref 0.1–1.0)
Monocytes Relative: 15 %
Neutro Abs: 5.5 10*3/uL (ref 1.7–7.7)
Neutrophils Relative %: 68 %
PLATELETS: 105 10*3/uL — AB (ref 150–400)
RBC: 3.19 MIL/uL — AB (ref 4.22–5.81)
RDW: 18.7 % — ABNORMAL HIGH (ref 11.5–15.5)
WBC: 8 10*3/uL (ref 4.0–10.5)

## 2018-06-04 LAB — GLUCOSE, CAPILLARY: GLUCOSE-CAPILLARY: 90 mg/dL (ref 70–99)

## 2018-06-04 MED ORDER — CHLORHEXIDINE GLUCONATE CLOTH 2 % EX PADS
6.0000 | MEDICATED_PAD | Freq: Every day | CUTANEOUS | Status: DC
  Administered 2018-06-08 – 2018-06-14 (×7): 6 via TOPICAL

## 2018-06-04 NOTE — Progress Notes (Signed)
Family Medicine Teaching Service Daily Progress Note Intern Pager: [phone]  Patient name: [name] Medical record number: [id number] Date of birth: 1966-09-19 Age: 52 y.o. Gender: male  Primary Care Provider: Patient, No Pcp Per Consultants: Cards Code Status: Full   Pt Overview and Major Events to Date:  8/23 Admit to FPTS for NSTEMI with TTE showing EF 20-25%, started on heparin and nitro drip 8/26 Cardiac cath 8/30 TEE, CTA  Assessment and Plan: [place] Cunninghamis a 52 y.o.malepresenting with chest pain and feeling unwell found to have NSTEMI. PMH is significant forHIV, ESRD, HSV, anemia, peripheral neuropathy, anemia, GI bleed, PUD, AS.  Inferolateral NSTEMI: Denies CP this am, states he is still SOB with exertion that was present prior to procedure. POD #3 angioplasty of the mid LAD and orbital atherectomy with drug elenting stent, requiring DAPT for atleast 12 months.  -Follow current recs -Continue aspirin, Toprol, Crestor - Continue Plavix 25 mg daily -Telemetry and continuous pulse ox --Recommended Home health PT/OT    Severe Aortic Stenosis: Likely contributing of his dyspnea with exertion. 3/6 systolic murmur. Planning for TAVR next Tuesday, 9/10.    Hyperkalemia: Resolved. K 4.0 this am after dialysis yesterday.  -Monitor BMP   HFrEF: EF 15-20% on TEE 8/30, prior to LAD PCI.  -Cont dialysis for volume control  -Cont toprol, crestor -No ACE/ARB due to lower blood pressures, will mention on discharge summary  ESRD on HD: Chronic. T/Th/Sa schedule.  -continue HD on current schedule -Nephrology following, appreciate recs   Homelessness: Pt endorses him and his wife are homeless at this time.  -Consulted social work 9/5 for additional resources   COPD/RLD: Stable. PFT's for pre-TAVR showing moderately severe obstructive airways disease, severe interstitial restriction, and severe diffusion defect.  -Albuterol PRN -Started Spiriva 9/5    Hypertension: Softer pressures-normotensive overnight.  -Toprol XL 25mg  daily   Borderline Lymphadenopathy: Borderline enlarged in mediastinum (1.3/1cm) and retroperitoneal nodes (1cm) on CT 8/30, similar to previous CT in 2017. Thought to be likely 2/2 chronic lymphatic congestion or inflammation, however could not r/o CLL via imaging. No intervention at this time given stability and no further signs or symptoms concerning for CLL.    HIV: Chronic. Well-controlled.  -Continue Tivicay -Cont Prezista -Cont Ritonavir   Post herpetic lesion: No complaints.  -continuehomegabapentin 100mg  daily -monitor pain  HLD: Chronic LFT's stable, Minimal interaction with current HIV medications he is on, with close monitoring of his LFTs.  - Crestor 10mg  daily  HSV: Chronic, stable On Acyclovir prn. No current outbreak. - consider acyclovir prn if symptoms arise during hospitalization   PUD: Stable On protonix 40mg  BID at home. - cont home protonix - cont to monitor CBC  Anemia: Chronic, stable Likely related to CKD. Hgb 11.  - cont to monitor CBC  Peripheral Neuropathy: Chronic Symptoms unchanged. Last Vitamin B12 12/2016, 332. - cont to monitor   Pterygium OU: Chronic No change in symptoms, has seen ophtho in the past. - cont artificial tears prn  Tobacco Abuse Current 0.5 ppd smoker. - encourage tobacco cessation - consider nicotine patch prn  FEN/GI: renal/fluid restricted PPx: lovenox   Disposition: Continued inpatient are   Subjective:  No acute events overnight. Wasn't able to sleep well last night. Denies CP or Sob at rest, notes dyspnea on exertion but trying to continue walking. Had several BM overnight, however formed and normal color.   Objective: Temp:  [97.3 F (36.3 C)-97.9 F (36.6 C)] 97.5 F (36.4 C) (09/06 0438)  Pulse Rate:  [61-71] 61 (09/06 0438) Resp:  [14-20] 18 (09/06 0438) BP: (83-131)/(27-73) 86/69 (09/06 0438) SpO2:  [96  %-100 %] 96 % (09/06 0438) Weight:  [67.3 kg-70.4 kg] 67.3 kg (09/06 0438) Physical Exam: General: Alert, NAD HEENT: NCAT, MM dry Cardiac: RRR 3/6 systolic murmur  Lungs: Clear bilaterally, no increased WOB on RA Abdomen: soft, non-tender, non-distended, normoactive BS Msk: Moves all extremities spontaneously  Ext: Warm, dry, 2+ distal pulses, trace to 1+ pitting edema to BLE, LUE AVF     Laboratory: Recent Labs  Lab 06/27/2018 0715 06/02/18 0655 06/03/18 0359  WBC 6.7 6.6 6.1  HGB 10.6* 11.5* 10.9*  HCT 33.6* 36.7* 34.1*  PLT 95* 96* 112*   Recent Labs  Lab 06/02/18 0405 06/02/18 1858 06/03/18 0359 06/03/18 1523  NA 139 139 137  --   K 5.8* 5.2* 6.5* 3.8  CL 96* 95* 94*  --   CO2 23 22 23   --   BUN 51* 66* 77*  --   CREATININE 6.87* 7.60* 7.93*  --   CALCIUM 9.5 9.6 9.3  --   GLUCOSE 126* 147* 98  --      Imaging/Diagnostic Tests: No results found.  Allayne Stack, DO 06/04/2018, 6:06 AM PGY-1, [facility] FPTS Intern pager: [phone], text pages welcome

## 2018-06-04 NOTE — Clinical Social Work Note (Signed)
Went by room to give resources. Patient in bathroom. Will try again later.  Charlynn Court, CSW [phone]

## 2018-06-04 NOTE — Progress Notes (Signed)
[id number] Came to see pt to walk. Pt stated he did not rest last night and he is resting in chair now. Stated he will walk about noon or one. Breathing looks better talking with pt than it did on Wednesday when I saw him. Pt stated his breathing feels better with inhaler. Checked sats at 98%RA. Encouraged him to walk with staff later. Will continue to follow pt. Luetta Nutting RN BSN 06/04/2018 11:03 AM

## 2018-06-04 NOTE — H&P (View-Only) (Signed)
  [facility]   MULTIDISCIPLINARY HEART VALVE TEAM  Patient doing well today. No complaints. Due to poor social situation and complex medical history with need for HD, he will need to stay inpatient until his scheduled TAVR next Tuesday. Case management on board trying to find placement options for discharge after surgery given homelessness.    Mykhia Danish PA-C  [facility]  Pager [phone]  

## 2018-06-04 NOTE — Clinical Social Work Note (Signed)
Clinical Social Work Assessment  Patient Details  Name: [name] MRN: [id number] Date of Birth: 1966-09-17  Date of referral:  06/04/18               Reason for consult:  [employer]                Permission sought to share information with:    Permission granted to share information::  No  Name::        Agency::     Relationship::     Contact Information:     Housing/Transportation Living arrangements for the past 2 months:  Apartment, Hotel/Motel Source of Information:  Patient, Medical Team Patient Interpreter Needed:  None Criminal Activity/Legal Involvement Pertinent to Current Situation/Hospitalization:  No - Comment as needed Significant Relationships:  Friend, Spouse, Parents, Other Family Members Lives with:  Spouse Do you feel safe going back to the place where you live?  Yes Need for family participation in patient care:  Yes (Comment)  Care giving concerns:  Homelessness.   Social Worker assessment / plan:  CSW met with patient. No supports at bedside. CSW introduced role and inquired about needs. Patient stated his wife's client has allowed them to stay in her apartment for $100 per month. The client is moving in with her cousin. Patient however prefers to get a place of his own and asked CSW to assist in finding him an apartment. Patient has lived in multiple apartments recently but voiced concerns with their administration, maintenance, etc. They were living in a hotel prior to admission. Patient expressed high stress levels. He reports a good support system between his friends, father, and wife's son. Patient's family is from [place] while his wife's family is from [place]. He hopes to save enough money to move to [place] soon. CSW provided shelter resources, emergency assistance resources, and booklets for free meals and food pantries in [place]. No further concerns. CSW signing off as social work intervention is no longer  needed.  Employment status:  Disabled (Comment on whether or not currently receiving Disability) Insurance information:  Medicare PT Recommendations:  Home with [place] / Referral to community resources:  Shelter, Other (Comment Required)(Community resources.)  Patient/Family's Response to care:  Patient agreeable to receiving resources. Patient's family and friends supportive and involved in patient's care. Patient appreciated social work intervention.  Patient/Family's Understanding of and Emotional Response to Diagnosis, Current Treatment, and Prognosis:  Patient has a good understanding of the reason for admission and social work consult. Patient appears happy with hospital care.  Emotional Assessment Appearance:  Appears stated age Attitude/Demeanor/Rapport:  Engaged, Gracious Affect (typically observed):  Accepting, Appropriate, Calm, Pleasant Orientation:  Oriented to Self, Oriented to Place, Oriented to  Time, Oriented to Situation Alcohol / Substance use:  Never Used Psych involvement (Current and /or in the community):  No (Comment)  Discharge Needs  Concerns to be addressed:  Care Coordination Readmission within the last 30 days:  No Current discharge risk:  None Barriers to Discharge:  Continued Medical Work up   Candie Chroman, LCSW 06/04/2018, 1:41 PM

## 2018-06-04 NOTE — Progress Notes (Signed)
Subjective:  K+ better, no new c/o  Objective Vital signs in last 24 hours: Vitals:   06/03/18 2053 06/04/18 0438 06/04/18 0812 06/04/18 0939  BP: 104/73 (!) 86/69  (!) 84/65  Pulse: 65 61  (!) 59  Resp: 18 18    Temp: (!) 97.5 F (36.4 C) (!) 97.5 F (36.4 C)    TempSrc: Oral Oral    SpO2: 99% 96% 99%   Weight:  67.3 kg    Height:       Weight change: 2.179 kg  Physical Exam: General: alert , calm ,thin, Male ,no apparent distress Heart: RRR, 3/6 SEM, No rub or gallop Lungs: CTA bilat  Abdomen: bs pos. , soft , nt, nd Extremities: no edema Dialysis Access: pos bruit LUA AVF   Dialysis Orders:  TTS [facility] 3.5h   66kg   2K/2.5Ca  P4   LUE AVG   Hep none -Hectorol 3 mcg IV TIW -Venofer 50mg  IV q weeks  -Mircera IV q 2 weeks last 8/20  -Ca acetate 4 tid qac--> stopped while hospitalized in light of hyperCa  Problem/Plan: 1. NSTEMI- LHC severe CAD, HFrEF (ef 20-25%) severe calcified AoV/   SP PTCA to mid LAD with Orbital Arthrectomy/Stenting. TAVR for next TUES. 2. ESRD - HD TTS. HD tomorrow 3. HTN/volume / EF 20-25 % CM - UF to dry wt 4. Anemia of ESRD- hgb > 10 here.    Aranesp   weelky hd bNut held with hgb >11, follow up trend/ weekly fe on thurs hd  5. Secondary hyperparathyroidism -Binder  Renvela 800mg  2 ac  Phos 9 to 6  Reck pre hd in am , Correc Ca high on admit  Phoslo dc / hold vit d  6. HIV- Meds  7. Nutrition = Alb 2.9  Renal ,carb mod diet, pro-stat supplement     Vinson Moselle MD [facility] pager [phone]   06/04/2018, 11:04 AM    Labs: Basic Metabolic Panel: Recent Labs  Lab 05/29/18 0840  06/02/18 1858 06/03/18 0359 06/03/18 1523 06/04/18 0555  NA 136   < > 139 137  --  137  K 4.6   < > 5.2* 6.5* 3.8 4.0  CL 101   < > 95* 94*  --  97*  CO2 22   < > 22 23  --  23  GLUCOSE 164*   < > 147* 98  --  108*  BUN 43*   < > 66* 77*  --  42*  CREATININE 6.75*   < > 7.60* 7.93*  --  5.27*  CALCIUM 9.3   < > 9.6 9.3  --   8.8*  PHOS 6.8*  --   --  8.6*  --   --    < > = values in this interval not displayed.   Liver Function Tests: Recent Labs  Lab 05/29/18 0840 06/03/18 0359  ALBUMIN 2.9* 3.4*   No results for input(s): LIPASE, AMYLASE in the last 168 hours. No results for input(s): AMMONIA in the last 168 hours. CBC: Recent Labs  Lab 05/31/18 0457 06/04/2018 0715 06/02/18 0655 06/03/18 0359 06/04/18 0555  WBC 7.3 6.7 6.6 6.1 8.0  NEUTROABS 3.3  --   --   --  5.5  HGB 11.2* 10.6* 11.5* 10.9* 11.0*  HCT 35.4* 33.6* 36.7* 34.1* 34.5*  MCV 110.3* 110.2* 110.5* 107.6* 108.2*  PLT 92* 95* 96* 112* 105*   Cardiac Enzymes: No results for input(s): CKTOTAL, CKMB, CKMBINDEX, TROPONINI  in the last 168 hours. CBG: Recent Labs  Lab 06/02/18 1528 06/02/18 1637 06/04/18 0827  GLUCAP 118* 143* 90    Studies/Results: No results found. Medications: . sodium chloride    . sodium chloride    . ferric gluconate (FERRLECIT/NULECIT) IV 62.5 mg (06/03/18 1038)   . sodium chloride   Intravenous Once  . angioplasty book   Does not apply Once  . aspirin  81 mg Oral Daily  . clopidogrel  75 mg Oral Daily  . darunavir  800 mg Oral Q supper  . dolutegravir  50 mg Oral Q supper  . enoxaparin (LOVENOX) injection  30 mg Subcutaneous Q24H  . feeding supplement (PRO-STAT SUGAR FREE 64)  30 mL Oral BID  . gabapentin  100 mg Oral Daily  . metoprolol succinate  25 mg Oral Daily  . pantoprazole  40 mg Oral BID  . ritonavir  100 mg Oral Q supper  . rosuvastatin  10 mg Oral q1800  . sevelamer carbonate  1,600 mg Oral TID WC  . sodium chloride flush  3 mL Intravenous Q12H  . sodium chloride flush  3 mL Intravenous Q12H  . tenofovir  300 mg Oral Weekly  . tiotropium  18 mcg Inhalation Daily

## 2018-06-04 NOTE — Plan of Care (Signed)
  Problem: Education: Goal: Understanding of CV disease, CV risk reduction, and recovery process will improve Outcome: Progressing   Problem: Health Behavior/Discharge Planning: Goal: Ability to safely manage health-related needs after discharge will improve Outcome: Progressing   

## 2018-06-04 NOTE — Progress Notes (Signed)
  [facility]   MULTIDISCIPLINARY HEART VALVE TEAM  Patient doing well today. No complaints. Due to poor social situation and complex medical history with need for HD, he will need to stay inpatient until his scheduled TAVR next Tuesday. Case management on board trying to find placement options for discharge after surgery given homelessness.    Cline Crock PA-C  [facility]  Pager [phone]

## 2018-06-05 LAB — RENAL FUNCTION PANEL
Albumin: 3.1 g/dL — ABNORMAL LOW (ref 3.5–5.0)
Anion gap: 17 — ABNORMAL HIGH (ref 5–15)
BUN: 62 mg/dL — ABNORMAL HIGH (ref 6–20)
CHLORIDE: 98 mmol/L (ref 98–111)
CO2: 23 mmol/L (ref 22–32)
Calcium: 8.7 mg/dL — ABNORMAL LOW (ref 8.9–10.3)
Creatinine, Ser: 6.55 mg/dL — ABNORMAL HIGH (ref 0.61–1.24)
GFR calc Af Amer: 10 mL/min — ABNORMAL LOW (ref >60)
GFR, EST NON AFRICAN AMERICAN: 9 mL/min — AB (ref >60)
Glucose, Bld: 87 mg/dL (ref 70–99)
Phosphorus: 4.7 mg/dL — ABNORMAL HIGH (ref 2.5–4.6)
Potassium: 4.2 mmol/L (ref 3.5–5.1)
Sodium: 138 mmol/L (ref 135–145)

## 2018-06-05 NOTE — Progress Notes (Addendum)
[facility] Progress Note   Subjective:  Seen in room, will be coming up for HD this afternoon. No CP, occ dyspnea. Was able to walk the halls this morning without issues.  Objective Vitals:   06/04/18 0939 06/04/18 1133 06/04/18 1915 06/05/18 0500  BP: (!) 84/65 90/68 (!) 85/66 90/71  Pulse: (!) 59 (!) 59 62 66  Resp:  16 18 18   Temp:  (!) 93 F (33.9 C) (!) 97.5 F (36.4 C) 97.8 F (36.6 C)  TempSrc:   Oral Oral  SpO2:  100% 100% 94%  Weight:    61.1 kg  Height:       Physical Exam General: Thin man, NAD Heart: RRR; 3/6 SEM Lungs: Bibasilar rales, no wheezing Extremities: 1+ LE edema Dialysis Access: AVF + bruit  Additional Objective Labs: Basic Metabolic Panel: Recent Labs  Lab 06/03/18 0359 06/03/18 1523 06/04/18 0555 06/05/18 0433  NA 137  --  137 138  K 6.5* 3.8 4.0 4.2  CL 94*  --  97* 98  CO2 23  --  23 23  GLUCOSE 98  --  108* 87  BUN 77*  --  42* 62*  CREATININE 7.93*  --  5.27* 6.55*  CALCIUM 9.3  --  8.8* 8.7*  PHOS 8.6*  --   --  4.7*   Liver Function Tests: Recent Labs  Lab 06/03/18 0359 06/05/18 0433  ALBUMIN 3.4* 3.1*   CBC: Recent Labs  Lab 05/31/18 0457 06-06-2018 0715 06/02/18 0655 06/03/18 0359 06/04/18 0555  WBC 7.3 6.7 6.6 6.1 8.0  NEUTROABS 3.3  --   --   --  5.5  HGB 11.2* 10.6* 11.5* 10.9* 11.0*  HCT 35.4* 33.6* 36.7* 34.1* 34.5*  MCV 110.3* 110.2* 110.5* 107.6* 108.2*  PLT 92* 95* 96* 112* 105*   Medications: . sodium chloride    . sodium chloride    . ferric gluconate (FERRLECIT/NULECIT) IV 62.5 mg (06/03/18 1038)   . angioplasty book   Does not apply Once  . aspirin  81 mg Oral Daily  . Chlorhexidine Gluconate Cloth  6 each Topical Q0600  . clopidogrel  75 mg Oral Daily  . darunavir  800 mg Oral Q supper  . dolutegravir  50 mg Oral Q supper  . enoxaparin (LOVENOX) injection  30 mg Subcutaneous Q24H  . feeding supplement (PRO-STAT SUGAR FREE 64)  30 mL Oral BID  . gabapentin  100 mg Oral Daily   . metoprolol succinate  25 mg Oral Daily  . pantoprazole  40 mg Oral BID  . ritonavir  100 mg Oral Q supper  . rosuvastatin  10 mg Oral q1800  . sevelamer carbonate  1,600 mg Oral TID WC  . sodium chloride flush  3 mL Intravenous Q12H  . sodium chloride flush  3 mL Intravenous Q12H  . tenofovir  300 mg Oral Weekly  . tiotropium  18 mcg Inhalation Daily    Dialysis Orders: TTS [facility] 3.5h 66kg 2K/2.5Ca P4 LUE AVG Hep none -Hectorol 3 mcg IV TIW -Venofer 50mg  IV q weeks  -Mircera IV q 2 weeks last 8/20  -Ca acetate 4 tid qac--> stopped while hospitalized in light of hyperCa  Problem/Plan: 1. NSTEMI: S/p LHC showing severe CAD, HFrEF (EF 20-25%) severe calcified AoV. Now s/p PCA to mid LAD and sched for TAVR on 9/10. 2. ESRD: Continue HD per TTS schedule. 3. HTN/volume: BP chronically low. + edema. Well under prior EDW - losing weight. 4. Anemia of ESRD:  Hgb 11, ESA held. Monitor. 5. Secondary hyperparathyroidism: Ca/Phos ok. Continue Renvela as binder. Off Phoslo and VDRA on hold. 6. HIV: Home meds per primary. 7. Nutrition: Alb low, continue pro-stat supplement. 8. HFrEF (EF 20-25%)  Ozzie Hoyle, PA-C 06/05/2018, 11:50 AM  [facility] Pager: [phone]  Pt seen, examined and agree w A/P as above.  Vinson Moselle MD [employer] pager [phone]   06/05/2018, 1:53 PM

## 2018-06-05 NOTE — Progress Notes (Signed)
Pt requesting to shower Paged MD, MD gave verbal order for pt to shower

## 2018-06-05 NOTE — Progress Notes (Signed)
Pt refusing to go to hemodialysis because it is too late in the day  Educated pt and pt wife  Pt wife stated "he will not go this evening, hes putting his foot down" Informed hemodialysis staff

## 2018-06-05 NOTE — Progress Notes (Signed)
CARDIAC REHAB PHASE I   PRE:  Rate/Rhythm: 63 SR   BP:  Sitting: 100/70       SaO2: 97% RA  MODE:  Ambulation: 470 ft   POST:  Rate/Rhythm: 71 SR  BP:  Sitting: 104/72      SaO2: 97%  Pt in recliner on telephone. Pt agreed to walk. Pt ambulated 470 ft with walker. Pt had steady gait and took 2 short rest breaks. Pt reported mild SOB towards in of walk. Encouraged pt to do pursed lip breathing. Pt denied complaints of CP or dizziness. Pt returned to recliner. Pt asked about Cardiac Rehab. Explained phase II program to pt and gave brochure for [facility] Phase II. Put in order to Phase II CR per pt's request.   [phone]  York Cerise MS, ACSM CEP  10:46 AM 06/05/2018

## 2018-06-05 NOTE — Progress Notes (Addendum)
Family Medicine Teaching Service Daily Progress Note Intern Pager: [phone]  Patient name: [name] Medical record number: [id number] Date of birth: [birth year] Age: 52 y.o. Gender: male  Primary Care Provider: Patient, No Pcp Per Consultants: Cardiology Code Status: Full  Pt Overview and Major Events to Date:  8/23 Admit to FPTS for NSTEMI with TTE showing EF 20-25%, started on heparin and nitro drip 8/26 Cardiac cath 8/30 TEE, CTA  Assessment and Plan: Caleb Windhorst Cunninghamis a 52 y.o.malepresenting with chest pain and feeling unwell found to have NSTEMI. PMH is significant forHIV, ESRD, HSV, anemia, peripheral neuropathy, anemia, GI bleed, PUD, AS.  Inferolateral NSTEMI:S/P PCI, denies any chest pain or shortness of breath.  Was able to ambulate without any difficulty. -Continue DAPT for 1 year. -Continue aspirin, Toprol, Crestor. -Continue cardiac rehab.  Severe Aortic Stenosis: Likely contributing of his dyspnea with exertion. 3/6 systolic murmur.  He will get his TAVR on June 08, 2018.  HFrEF: EF 15-20% on TEE 8/30, prior to LAD PCI.  -Cont dialysis for volume control  -Cont toprol, crestor -No ACE/ARB due to lower blood pressures.  ESRD on HD: Chronic. T/Th/Sa. Patient will get his dialysis today. -continue HD on current schedule  COPD/RLD: Stable. PFT's for pre-TAVR showing moderately severe obstructive airways disease, severe interstitial restriction, and severe diffusion defect.  -Albuterol PRN -Started Spiriva 9/5   Hypertension:Softer pressures. -Continue monitoring-no more antihypertensives at this time.  Borderline Lymphadenopathy: Borderline enlarged in mediastinum (1.3/1cm) and retroperitoneal nodes (1cm) on CT 8/30, similar to previous CT in 2017. Thought to be likely 2/2 chronic lymphatic congestion or inflammation, however could not r/o CLL via imaging. No intervention at this time given stability and no further signs or symptoms  concerning for CLL.  HIV: Chronic. Well-controlled.  -Continue Tivicay -Cont Prezista -Cont Ritonavir   Post herpetic lesion: No complaints.  -continuehomegabapentin 100mg  daily -monitor pain  HLD: Chronic LFT's stable, Minimal interaction with current HIV medications he is on, with close monitoring of his LFTs.  - Crestor 10mg  daily  PUD: Stable On protonix 40mg  BID at home. - cont home protonix - cont to monitor CBC  Anemia: Chronic, stable Likely related to CKD. Hgb 11.  - cont to monitor CBC  Peripheral Neuropathy: Chronic Symptoms unchanged. Last Vitamin B12 12/2016, 332. - cont to monitor   Tobacco Abuse Current 0.5 ppd smoker. - encourage tobacco cessation - consider nicotine patch prn  Homelessness: Pt endorses him and his wife are homeless at this time.  Social worker has been consulted and they provided him with resources.  FEN/GI: renal/fluid restricted PPx: Lovenox  Disposition: Will stay inpatient until TAVR.  Subjective: Patient was sitting comfortably in his bed when seen this morning.  Denies any chest pain or shortness of breath, able to ambulate down the hall without any difficulty.  No overnight event.  Objective: Temp:  [93 F (33.9 C)-97.8 F (36.6 C)] 97.8 F (36.6 C) (09/07 0500) Pulse Rate:  [59-66] 66 (09/07 0500) Resp:  [16-18] 18 (09/07 0500) BP: (84-90)/(65-71) 90/71 (09/07 0500) SpO2:  [94 %-100 %] 94 % (09/07 0500) Weight:  [61.1 kg] 61.1 kg (09/07 0500) Physical Exam: General: Well-developed gentleman, in no acute distress. Cardiovascular: Regular rate and rhythm with systolic murmur. Respiratory: Clear bilaterally. Abdomen: Soft, nontender, nondistended, bowel sounds positive. Extremities: 1+ pitting edema on lower extremities bilaterally.  Pulses intact and symmetrical.  Laboratory: Recent Labs  Lab 06/02/18 0655 06/03/18 0359 06/04/18 0555  WBC 6.6 6.1 8.0  HGB 11.5*  10.9* 11.0*  HCT 36.7* 34.1* 34.5*  PLT  96* 112* 105*   Recent Labs  Lab 06/03/18 0359 06/03/18 1523 06/04/18 0555 06/05/18 0433  NA 137  --  137 138  K 6.5* 3.8 4.0 4.2  CL 94*  --  97* 98  CO2 23  --  23 23  BUN 77*  --  42* 62*  CREATININE 7.93*  --  5.27* 6.55*  CALCIUM 9.3  --  8.8* 8.7*  GLUCOSE 98  --  108* 87    Arnetha Courser, MD 06/05/2018, 8:26 AM PGY-3, [facility] FPTS Intern pager: [phone], text pages welcome

## 2018-06-06 ENCOUNTER — Other Ambulatory Visit: Payer: Self-pay

## 2018-06-06 ENCOUNTER — Inpatient Hospital Stay (HOSPITAL_COMMUNITY): Payer: Medicare Other

## 2018-06-06 LAB — BLOOD GAS, VENOUS
Acid-base deficit: 7.3 mmol/L — ABNORMAL HIGH (ref 0.0–2.0)
Bicarbonate: 16 mmol/L — ABNORMAL LOW (ref 20.0–28.0)
O2 Content: 4 L/min
O2 Saturation: 97.9 %
PCO2 VEN: 23.8 mmHg — AB (ref 44.0–60.0)
Patient temperature: 98.6
pH, Ven: 7.443 — ABNORMAL HIGH (ref 7.250–7.430)
pO2, Ven: 97.9 mmHg — ABNORMAL HIGH (ref 32.0–45.0)

## 2018-06-06 LAB — BASIC METABOLIC PANEL
Anion gap: 22 — ABNORMAL HIGH (ref 5–15)
BUN: 81 mg/dL — AB (ref 6–20)
CO2: 18 mmol/L — ABNORMAL LOW (ref 22–32)
Calcium: 8.4 mg/dL — ABNORMAL LOW (ref 8.9–10.3)
Chloride: 99 mmol/L (ref 98–111)
Creatinine, Ser: 8.78 mg/dL — ABNORMAL HIGH (ref 0.61–1.24)
GFR calc Af Amer: 7 mL/min — ABNORMAL LOW (ref >60)
GFR calc non Af Amer: 6 mL/min — ABNORMAL LOW (ref >60)
GLUCOSE: 112 mg/dL — AB (ref 70–99)
POTASSIUM: 4.3 mmol/L (ref 3.5–5.1)
Sodium: 139 mmol/L (ref 135–145)

## 2018-06-06 LAB — MAGNESIUM: Magnesium: 2.7 mg/dL — ABNORMAL HIGH (ref 1.7–2.4)

## 2018-06-06 LAB — LACTIC ACID, PLASMA: Lactic Acid, Venous: 4.2 mmol/L (ref 0.5–1.9)

## 2018-06-06 MED ORDER — INSULIN ASPART 100 UNIT/ML ~~LOC~~ SOLN
5.0000 [IU] | Freq: Once | SUBCUTANEOUS | Status: AC
  Administered 2018-06-06: 5 [IU] via SUBCUTANEOUS

## 2018-06-06 MED ORDER — DEXTROSE 50 % IV SOLN
1.0000 | Freq: Once | INTRAVENOUS | Status: AC
  Administered 2018-06-06: 50 mL via INTRAVENOUS
  Administered 2018-06-07: 25 mL via INTRAVENOUS

## 2018-06-06 MED ORDER — MIDAZOLAM HCL 2 MG/2ML IJ SOLN
2.0000 mg | Freq: Once | INTRAMUSCULAR | Status: AC
  Administered 2018-06-06: 2 mg via INTRAVENOUS

## 2018-06-06 MED ORDER — MIDAZOLAM HCL 2 MG/2ML IJ SOLN
INTRAMUSCULAR | Status: AC
  Filled 2018-06-06: qty 2

## 2018-06-06 NOTE — Code Documentation (Addendum)
  Patient Name: [name]   MRN: [id number]   Date of Birth/ Sex: 05/09/1966 , male      Admission Date: 05/22/2018  Attending Provider: Tobey Grim, MD  Primary Diagnosis: Acute on chronic systolic heart failure [facility])   Indication: Pt was in his usual state of health until this PM, when he was noted to be unresponsive. Code blue was subsequently called. At the time of arrival on scene, ACLS protocol was underway.   Technical Description:  - CPR performance duration:  2 minutes  - Was defibrillation or cardioversion used? No   - Was external pacer placed? Yes  - Was patient intubated pre/post CPR? No   Medications Administered: Y = Yes; Blank = No Amiodarone    Atropine  Y  Calcium    Epinephrine  Y  Lidocaine    Magnesium    Norepinephrine    Phenylephrine    Sodium bicarbonate  Y  Vasopressin     Other meds administered include: D50 1 amp, Novolog 5 units, Versed 2 mg  Post CPR evaluation:  - Final Status - Was patient successfully resuscitated ? Yes - What is current rhythm? Complete heart block - What is current hemodynamic status? Stable, MAP 74  Miscellaneous Information:  - Labs sent, including: BMP, Mag, Lactate, VBG  - Primary team notified?  Yes  - Family Notified? Yes  - Additional notes/ transfer status:  Transcutaneous pacing was performed. Transferred to CVICU. CXR ordered.     [name], Caleb Corti, MD  06/06/2018, 10:24 PM

## 2018-06-06 NOTE — Progress Notes (Signed)
FTPS Interim Progress Note:   Received page from nursing around 2202 that patient had a code blue called, went to evaluate pt at bedside. Went into asystole, CPR initiated and ROSC achieved two minutes later. Received epi, atropine, bicarb, and Novolog/D50 in case of hyperkalemia. Labs collected and sent. HR labile 20-70's and EKG concerning for 3rd degree heart block, external pacing was started and versed given x2. Transferred to heart floor. Cardiology was consulted and reviewed data, felt there was no further intervention needed at this time. Pt now intermittently alert, oriented, and hemodynamically stable, MAP 58/HR 60's, with self pacing. Continues to have low blood pressures (79/52), however MAP wnl. Will continue to closely monitor vitals and follow his labs. Will have low threshold to start pressor therapy.   Allayne Stack, DO  PGY-1 Family Medicine

## 2018-06-06 NOTE — Progress Notes (Signed)
Chaplain received a Code [employer] to [employer], Rm 11. At the time of the page Chaplain was in the ED with a trauma call and could not respond at the time of the call, however follow was done at a later time and was informed by the Nursing Staff the the patient had been transferred to 2H22, Chaplain will follow up on the patient's status. Chaplain Janell Quiet [phone]

## 2018-06-06 NOTE — Progress Notes (Signed)
Family Medicine Teaching Service Daily Progress Note Intern Pager: [phone]  Patient name: [name] Medical record number: [id number] Date of birth: July 05, 1966 Age: 52 y.o. Gender: male  Primary Care Provider: Patient, No Pcp Per Consultants: Cardiology Code Status: Full  Pt Overview and Major Events to Date:  8/23 Admit to FPTS for NSTEMI with TTE showing EF 20-25%, started on heparin and nitro drip 8/26 Cardiac cath 8/30 TEE, CTA  Assessment and Plan: Caleb John Cunninghamis a 52 y.o.malepresenting with chest pain and feeling unwell found to have NSTEMI. PMH is significant forHIV, ESRD, HSV, anemia, peripheral neuropathy, anemia, GI bleed, PUD, AS.  Inferolateral NSTEMI:S/P PCI, denies any chest pain or shortness of breath.  Was able to ambulate without any difficulty. -Continue DAPT for 1 year. -Continue aspirin, Toprol, Crestor. -Continue cardiac rehab.  Severe Aortic Stenosis:Likely contributing of his dyspnea with exertion.3/6 systolic murmur.  He will get his TAVR on Jun 30, 2018.  HFrEF: EF 15-20% on TEE 8/30, prior to LAD PCI.  Having some bilateral basal crackles and little worsening of lower extremity edema.  As did not get his dialysis yesterday. -Cont dialysis for volume control  -Cont toprol, crestor -No ACE/ARB due to lower blood pressures. -Cardiac rehab.  ESRD on HD: Chronic. T/Th/Sa. Patient refused HD yesterday as it was late for him, expecting it today.  He was explained by her nephrologist that on Sundays they only do emergency dialysis, most likely will get tomorrow. -continue HD on current schedule  COPD/RLD:Stable.PFT's for pre-TAVR showing moderately severe obstructive airways disease, severe interstitial restriction, and severe diffusion defect.  -Albuterol PRN -Started Spiriva 9/5  Hypertension:Softer pressures. -Continue monitoring-no more antihypertensives at this time.  Borderline Lymphadenopathy:  Borderline enlarged in mediastinum (1.3/1cm) and retroperitoneal nodes (1cm) on CT 8/30, similar to previous CT in 2017. Thought to be likely 2/2 chronic lymphatic congestion or inflammation, however could not r/o CLL via imaging. No intervention at this time given stability and no further signs or symptoms concerning for CLL.  HIV: Chronic. Well-controlled.  -Continue Tivicay -Cont Prezista -Cont Ritonavir   Post herpetic lesion: No complaints.  -continuehomegabapentin 100mg  daily -monitor pain  HLD: Chronic LFT's stable, Minimal interaction with current HIV medications he is on, with close monitoring of his LFTs. - Crestor 10mg  daily  PUD: Stable On protonix 40mg  BID at home. - cont home protonix - cont to monitor CBC  Anemia: Chronic, stable Likely related to CKD.Hgb 11. - cont to monitor CBC  Peripheral Neuropathy: Chronic Symptoms unchanged. Last Vitamin B12 12/2016, 332. - cont to monitor   Tobacco Abuse Current 0.5 ppd smoker. - encourage tobacco cessation - consider nicotine patch prn  Homelessness: Pt endorses him and his wife are homeless at this time.  Social worker has been consulted and they provided him with resources.  FEN/GI: renal/fluid restricted PPx: Lovenox  Disposition: Waiting for TAVR.  Subjective: Patient is feeling better when seen this morning.  Refused dialysis yesterday, stating that it was too late for him.  Explained that while inpatient he cannot get dialysis according to his schedule as we deal with emergencies which can push back as scheduled dialysis, patient seems understanding.  He was concerned about missing a meal while going on dialysis, stating he does not like that her key sandwiches which he gets if he missed his regular meals.  Talked with the nurse to save his meal while he is on dialysis. Has no complaints.  Denies any chest pain or shortness of breath.  Objective:  Temp:  [97.5 F (36.4 C)-97.7 F (36.5 C)]  97.5 F (36.4 C) (09/08 1128) Pulse Rate:  [58-63] 63 (09/08 1128) Resp:  [18] 18 (09/08 1128) BP: (89-94)/(64-77) 94/64 (09/08 1128) SpO2:  [94 %-99 %] 99 % (09/08 1128) Weight:  [68.9 kg] 68.9 kg (09/08 0454) Physical Exam: General: Well-developed gentleman, in no acute distress. Cardiovascular: Regular rate and rhythm with systolic murmur. Respiratory: Bilateral basal crackles. Abdomen: Soft, nontender, bowel sounds positive. Extremities: 2+ lower extremity edema bilaterally, pulses intact and symmetrical.  Laboratory: Recent Labs  Lab 06/02/18 0655 06/03/18 0359 06/04/18 0555  WBC 6.6 6.1 8.0  HGB 11.5* 10.9* 11.0*  HCT 36.7* 34.1* 34.5*  PLT 96* 112* 105*   Recent Labs  Lab 06/03/18 0359 06/03/18 1523 06/04/18 0555 06/05/18 0433  NA 137  --  137 138  K 6.5* 3.8 4.0 4.2  CL 94*  --  97* 98  CO2 23  --  23 23  BUN 77*  --  42* 62*  CREATININE 7.93*  --  5.27* 6.55*  CALCIUM 9.3  --  8.8* 8.7*  GLUCOSE 98  --  108* 87    Caleb Courser, MD 06/06/2018, 1:44 PM PGY-3, [facility] FPTS Intern pager: [phone], text pages welcome

## 2018-06-06 NOTE — Progress Notes (Addendum)
[facility] Progress Note   Subjective:   Seen in room. Refused to come to HD last evening b/c he felt it was too late. He thought that we could just run him today, but explained that we will only dialyze on Sunday if emergency. Will plan for next HD to be tomorrow - Mon 9/9. No CP, dyspnea.  Objective Vitals:   06/05/18 1252 06/05/18 1934 06/06/18 0454 06/06/18 1128  BP: 102/69 (!) 89/65 91/77 94/64   Pulse: 60 63 (!) 58 63  Resp: 18 18 18 18   Temp: (!) 97.4 F (36.3 C) 97.7 F (36.5 C) 97.6 F (36.4 C) (!) 97.5 F (36.4 C)  TempSrc: Oral Oral Oral Oral  SpO2: 97% 94% 96% 99%  Weight:   68.9 kg   Height:       Physical Exam General: Thin man, NAD Heart: RRR; 3/6 SEM Lungs: Bibasilar rales, no wheezing Extremities: 1+ LE edema Dialysis Access: AVF + bruit  Additional Objective Labs: Basic Metabolic Panel: Recent Labs  Lab 06/03/18 0359 06/03/18 1523 06/04/18 0555 06/05/18 0433  NA 137  --  137 138  K 6.5* 3.8 4.0 4.2  CL 94*  --  97* 98  CO2 23  --  23 23  GLUCOSE 98  --  108* 87  BUN 77*  --  42* 62*  CREATININE 7.93*  --  5.27* 6.55*  CALCIUM 9.3  --  8.8* 8.7*  PHOS 8.6*  --   --  4.7*   Liver Function Tests: Recent Labs  Lab 06/03/18 0359 06/05/18 0433  ALBUMIN 3.4* 3.1*   CBC: Recent Labs  Lab 05/31/18 0457 01-Jul-2018 0715 06/02/18 0655 06/03/18 0359 06/04/18 0555  WBC 7.3 6.7 6.6 6.1 8.0  NEUTROABS 3.3  --   --   --  5.5  HGB 11.2* 10.6* 11.5* 10.9* 11.0*  HCT 35.4* 33.6* 36.7* 34.1* 34.5*  MCV 110.3* 110.2* 110.5* 107.6* 108.2*  PLT 92* 95* 96* 112* 105*   Medications: . sodium chloride Stopped (06/05/18 1531)  . sodium chloride Stopped (06/05/18 1531)  . ferric gluconate (FERRLECIT/NULECIT) IV Stopped (06/05/18 1531)   . angioplasty book   Does not apply Once  . aspirin  81 mg Oral Daily  . Chlorhexidine Gluconate Cloth  6 each Topical Q0600  . clopidogrel  75 mg Oral Daily  . darunavir  800 mg Oral Q supper  .  dolutegravir  50 mg Oral Q supper  . enoxaparin (LOVENOX) injection  30 mg Subcutaneous Q24H  . feeding supplement (PRO-STAT SUGAR FREE 64)  30 mL Oral BID  . gabapentin  100 mg Oral Daily  . metoprolol succinate  25 mg Oral Daily  . pantoprazole  40 mg Oral BID  . ritonavir  100 mg Oral Q supper  . rosuvastatin  10 mg Oral q1800  . sevelamer carbonate  1,600 mg Oral TID WC  . sodium chloride flush  3 mL Intravenous Q12H  . sodium chloride flush  3 mL Intravenous Q12H  . tenofovir  300 mg Oral Weekly  . tiotropium  18 mcg Inhalation Daily    Dialysis Orders: TTS [facility] 3.5h 66kg 2K/2.5Ca P4 LUE AVG Hep none -Hectorol 3 mcg IV TIW -Venofer 50mg  IV q weeks  -Mircera IV q 2 weeks last 8/20  -Ca acetate 4 tid qac--> stopped while hospitalized in light of hyperCa  Problem/Plan: 1. NSTEMI: S/p LHC showing severe CAD, HFrEF (EF 20-25%) severe calcified AoV. Now s/p PCA to mid LAD and sched  for TAVR on 9/10. 2. ESRD: Usual TTS schedule, refused 9/7. Will dialyze on 9/9 in prep for surgery on 9/10. 3. HTN/volume: BP chronically low. + edema. Well under prior EDW - losing weight. 4. Anemia of ESRD: Hgb 11, ESA held. Monitor. 5. Secondary hyperparathyroidism: Ca/Phos ok. Continue Renvela as binder. Off Phoslo and VDRA on hold. 6. HIV: Home meds per primary. 7. Nutrition: Alb low, continue pro-stat supplement. 8. HFrEF (EF 20-25%)  Caleb Hoyle, PA-C 06/06/2018, 12:25 PM  [facility] Pager: [phone]  Pt seen, examined and agree w A/P as above.  Vinson Moselle MD [employer] pager [phone]   06/06/2018, 1:34 PM

## 2018-06-07 ENCOUNTER — Encounter (HOSPITAL_COMMUNITY): Admission: EM | Disposition: E | Payer: Self-pay | Source: Home / Self Care | Attending: Family Medicine

## 2018-06-07 ENCOUNTER — Inpatient Hospital Stay (HOSPITAL_COMMUNITY): Payer: Medicare Other

## 2018-06-07 ENCOUNTER — Encounter (HOSPITAL_COMMUNITY): Payer: Self-pay | Admitting: Internal Medicine

## 2018-06-07 DIAGNOSIS — J9601 Acute respiratory failure with hypoxia: Secondary | ICD-10-CM

## 2018-06-07 DIAGNOSIS — R570 Cardiogenic shock: Secondary | ICD-10-CM

## 2018-06-07 DIAGNOSIS — I442 Atrioventricular block, complete: Secondary | ICD-10-CM

## 2018-06-07 DIAGNOSIS — I469 Cardiac arrest, cause unspecified: Secondary | ICD-10-CM

## 2018-06-07 DIAGNOSIS — Z955 Presence of coronary angioplasty implant and graft: Secondary | ICD-10-CM

## 2018-06-07 DIAGNOSIS — J9602 Acute respiratory failure with hypercapnia: Secondary | ICD-10-CM

## 2018-06-07 HISTORY — PX: TEMPORARY PACEMAKER: CATH118268

## 2018-06-07 LAB — POCT I-STAT, CHEM 8
BUN: 74 mg/dL — ABNORMAL HIGH (ref 6–20)
CALCIUM ION: 1.02 mmol/L — AB (ref 1.15–1.40)
CHLORIDE: 98 mmol/L (ref 98–111)
CREATININE: 9.3 mg/dL — AB (ref 0.61–1.24)
GLUCOSE: 204 mg/dL — AB (ref 70–99)
HCT: 32 % — ABNORMAL LOW (ref 39.0–52.0)
Hemoglobin: 10.9 g/dL — ABNORMAL LOW (ref 13.0–17.0)
Potassium: 3.8 mmol/L (ref 3.5–5.1)
Sodium: 136 mmol/L (ref 135–145)
TCO2: 26 mmol/L (ref 22–32)

## 2018-06-07 LAB — POCT I-STAT 3, ART BLOOD GAS (G3+)
ACID-BASE DEFICIT: 1 mmol/L (ref 0.0–2.0)
ACID-BASE EXCESS: 1 mmol/L (ref 0.0–2.0)
ACID-BASE EXCESS: 1 mmol/L (ref 0.0–2.0)
Acid-base deficit: 1 mmol/L (ref 0.0–2.0)
BICARBONATE: 27.7 mmol/L (ref 20.0–28.0)
BICARBONATE: 27.4 mmol/L (ref 20.0–28.0)
Bicarbonate: 25.2 mmol/L (ref 20.0–28.0)
Bicarbonate: 23.8 mmol/L (ref 20.0–28.0)
O2 SAT: 23 %
O2 SAT: 25 %
O2 Saturation: 100 %
O2 Saturation: 93 %
PO2 ART: 20 mmHg — AB (ref 83.0–108.0)
PO2 ART: 60 mmHg — AB (ref 83.0–108.0)
TCO2: 30 mmol/L (ref 22–32)
TCO2: 29 mmol/L (ref 22–32)
TCO2: 26 mmol/L (ref 22–32)
TCO2: 25 mmol/L (ref 22–32)
pCO2 arterial: 66.3 mmHg (ref 32.0–48.0)
pCO2 arterial: 52.3 mmHg — ABNORMAL HIGH (ref 32.0–48.0)
pCO2 arterial: 37.2 mmHg (ref 32.0–48.0)
pCO2 arterial: 35.6 mmHg (ref 32.0–48.0)
pH, Arterial: 7.229 — ABNORMAL LOW (ref 7.350–7.450)
pH, Arterial: 7.328 — ABNORMAL LOW (ref 7.350–7.450)
pH, Arterial: 7.438 (ref 7.350–7.450)
pH, Arterial: 7.424 (ref 7.350–7.450)
pO2, Arterial: 19 mmHg — CL (ref 83.0–108.0)
pO2, Arterial: 455 mmHg — ABNORMAL HIGH (ref 83.0–108.0)

## 2018-06-07 LAB — GLUCOSE, CAPILLARY
GLUCOSE-CAPILLARY: 139 mg/dL — AB (ref 70–99)
GLUCOSE-CAPILLARY: 43 mg/dL — AB (ref 70–99)
GLUCOSE-CAPILLARY: 96 mg/dL (ref 70–99)
GLUCOSE-CAPILLARY: 96 mg/dL (ref 70–99)
Glucose-Capillary: 211 mg/dL — ABNORMAL HIGH (ref 70–99)
Glucose-Capillary: 61 mg/dL — ABNORMAL LOW (ref 70–99)
Glucose-Capillary: 91 mg/dL (ref 70–99)
Glucose-Capillary: 39 mg/dL — CL (ref 70–99)

## 2018-06-07 LAB — LACTIC ACID, PLASMA
Lactic Acid, Venous: 4.3 mmol/L (ref 0.5–1.9)
Lactic Acid, Venous: 7.2 mmol/L (ref 0.5–1.9)
Lactic Acid, Venous: 3.7 mmol/L (ref 0.5–1.9)
Lactic Acid, Venous: 2.5 mmol/L (ref 0.5–1.9)

## 2018-06-07 LAB — CBC WITH DIFFERENTIAL/PLATELET
Abs Immature Granulocytes: 0.2 K/uL — ABNORMAL HIGH (ref 0.0–0.1)
Basophils Absolute: 0 K/uL (ref 0.0–0.1)
Basophils Relative: 0 %
Eosinophils Absolute: 0 K/uL (ref 0.0–0.7)
Eosinophils Relative: 0 %
HCT: 35.6 % — ABNORMAL LOW (ref 39.0–52.0)
Hemoglobin: 11.5 g/dL — ABNORMAL LOW (ref 13.0–17.0)
Immature Granulocytes: 1 %
Lymphocytes Relative: 14 %
Lymphs Abs: 2.8 K/uL (ref 0.7–4.0)
MCH: 34.2 pg — ABNORMAL HIGH (ref 26.0–34.0)
MCHC: 32.3 g/dL (ref 30.0–36.0)
MCV: 106 fL — ABNORMAL HIGH (ref 78.0–100.0)
Monocytes Absolute: 0.9 K/uL (ref 0.1–1.0)
Monocytes Relative: 5 %
Neutro Abs: 15.4 K/uL — ABNORMAL HIGH (ref 1.7–7.7)
Neutrophils Relative %: 80 %
Platelets: 129 K/uL — ABNORMAL LOW (ref 150–400)
RBC: 3.36 MIL/uL — ABNORMAL LOW (ref 4.22–5.81)
RDW: 18.5 % — ABNORMAL HIGH (ref 11.5–15.5)
WBC: 19.3 K/uL — ABNORMAL HIGH (ref 4.0–10.5)

## 2018-06-07 LAB — RENAL FUNCTION PANEL
ALBUMIN: 2.4 g/dL — AB (ref 3.5–5.0)
ANION GAP: 21 — AB (ref 5–15)
Albumin: 3 g/dL — ABNORMAL LOW (ref 3.5–5.0)
Albumin: 2.9 g/dL — ABNORMAL LOW (ref 3.5–5.0)
Anion gap: 16 — ABNORMAL HIGH (ref 5–15)
Anion gap: 17 — ABNORMAL HIGH (ref 5–15)
BUN: 74 mg/dL — AB (ref 6–20)
BUN: 83 mg/dL — ABNORMAL HIGH (ref 6–20)
BUN: 67 mg/dL — ABNORMAL HIGH (ref 6–20)
CALCIUM: 7.4 mg/dL — AB (ref 8.9–10.3)
CALCIUM: 8.4 mg/dL — AB (ref 8.9–10.3)
CHLORIDE: 100 mmol/L (ref 98–111)
CO2: 23 mmol/L (ref 22–32)
CO2: 22 mmol/L (ref 22–32)
CO2: 22 mmol/L (ref 22–32)
CREATININE: 8.5 mg/dL — AB (ref 0.61–1.24)
Calcium: 8.2 mg/dL — ABNORMAL LOW (ref 8.9–10.3)
Chloride: 103 mmol/L (ref 98–111)
Chloride: 97 mmol/L — ABNORMAL LOW (ref 98–111)
Creatinine, Ser: 9.27 mg/dL — ABNORMAL HIGH (ref 0.61–1.24)
Creatinine, Ser: 7.59 mg/dL — ABNORMAL HIGH (ref 0.61–1.24)
GFR calc Af Amer: 7 mL/min — ABNORMAL LOW (ref >60)
GFR calc non Af Amer: 6 mL/min — ABNORMAL LOW (ref >60)
GFR, EST AFRICAN AMERICAN: 7 mL/min — AB (ref >60)
GFR, EST AFRICAN AMERICAN: 9 mL/min — AB (ref >60)
GFR, EST NON AFRICAN AMERICAN: 6 mL/min — AB (ref >60)
GFR, EST NON AFRICAN AMERICAN: 7 mL/min — AB (ref >60)
GLUCOSE: 45 mg/dL — AB (ref 70–99)
Glucose, Bld: 208 mg/dL — ABNORMAL HIGH (ref 70–99)
Glucose, Bld: 58 mg/dL — ABNORMAL LOW (ref 70–99)
PHOSPHORUS: 5 mg/dL — AB (ref 2.5–4.6)
PHOSPHORUS: 6.9 mg/dL — AB (ref 2.5–4.6)
POTASSIUM: 4.3 mmol/L (ref 3.5–5.1)
Phosphorus: 5.4 mg/dL — ABNORMAL HIGH (ref 2.5–4.6)
Potassium: 3.8 mmol/L (ref 3.5–5.1)
Potassium: 4.2 mmol/L (ref 3.5–5.1)
SODIUM: 142 mmol/L (ref 135–145)
SODIUM: 140 mmol/L (ref 135–145)
Sodium: 139 mmol/L (ref 135–145)

## 2018-06-07 LAB — POCT ACTIVATED CLOTTING TIME
ACTIVATED CLOTTING TIME: 142 s
ACTIVATED CLOTTING TIME: 164 s
ACTIVATED CLOTTING TIME: 153 s
Activated Clotting Time: 213 seconds

## 2018-06-07 LAB — BASIC METABOLIC PANEL
ANION GAP: 21 — AB (ref 5–15)
BUN: 81 mg/dL — ABNORMAL HIGH (ref 6–20)
CALCIUM: 8.1 mg/dL — AB (ref 8.9–10.3)
CO2: 26 mmol/L (ref 22–32)
Chloride: 100 mmol/L (ref 98–111)
Creatinine, Ser: 9.18 mg/dL — ABNORMAL HIGH (ref 0.61–1.24)
GFR calc Af Amer: 7 mL/min — ABNORMAL LOW (ref >60)
GFR, EST NON AFRICAN AMERICAN: 6 mL/min — AB (ref >60)
Glucose, Bld: 84 mg/dL (ref 70–99)
Potassium: 4.7 mmol/L (ref 3.5–5.1)
SODIUM: 147 mmol/L — AB (ref 135–145)

## 2018-06-07 LAB — MAGNESIUM: Magnesium: 2.5 mg/dL — ABNORMAL HIGH (ref 1.7–2.4)

## 2018-06-07 LAB — CORTISOL: Cortisol, Plasma: 7.3 ug/dL

## 2018-06-07 LAB — TROPONIN I
TROPONIN I: 7.29 ng/mL — AB (ref <0.03)
Troponin I: 8.48 ng/mL (ref <0.03)

## 2018-06-07 SURGERY — TEMPORARY PACEMAKER
Anesthesia: LOCAL

## 2018-06-07 MED ORDER — MIDAZOLAM HCL 2 MG/2ML IJ SOLN
1.0000 mg | INTRAMUSCULAR | Status: DC | PRN
  Administered 2018-06-07 – 2018-06-09 (×2): 1 mg via INTRAVENOUS
  Filled 2018-06-07 (×4): qty 2

## 2018-06-07 MED ORDER — NOREPINEPHRINE 16 MG/250ML-% IV SOLN
0.0000 ug/min | INTRAVENOUS | Status: DC
  Administered 2018-06-07 – 2018-06-08 (×7): 60 ug/min via INTRAVENOUS
  Administered 2018-06-08: 62 ug/min via INTRAVENOUS
  Administered 2018-06-09 (×2): 64 ug/min via INTRAVENOUS
  Administered 2018-06-09: 43 ug/min via INTRAVENOUS
  Administered 2018-06-10: 34 ug/min via INTRAVENOUS
  Administered 2018-06-10: 20 ug/min via INTRAVENOUS
  Administered 2018-06-11: 16 ug/min via INTRAVENOUS
  Administered 2018-06-12: 9 ug/min via INTRAVENOUS
  Administered 2018-06-14: 6 ug/min via INTRAVENOUS
  Administered 2018-06-15: 2 ug/min via INTRAVENOUS
  Filled 2018-06-07 (×16): qty 250

## 2018-06-07 MED ORDER — DEXTROSE 50 % IV SOLN
INTRAVENOUS | Status: AC
  Filled 2018-06-07: qty 50

## 2018-06-07 MED ORDER — ETOMIDATE 2 MG/ML IV SOLN
20.0000 mg | Freq: Once | INTRAVENOUS | Status: AC
  Administered 2018-06-07: 20 mg via INTRAVENOUS
  Filled 2018-06-07: qty 10

## 2018-06-07 MED ORDER — PRISMASOL BGK 4/2.5 32-4-2.5 MEQ/L IV SOLN
INTRAVENOUS | Status: DC
  Administered 2018-06-07 – 2018-06-14 (×29): via INTRAVENOUS_CENTRAL
  Filled 2018-06-07 (×28): qty 5000

## 2018-06-07 MED ORDER — MIDAZOLAM HCL 2 MG/2ML IJ SOLN
INTRAMUSCULAR | Status: AC
  Administered 2018-06-07: 2 mg
  Filled 2018-06-07: qty 2

## 2018-06-07 MED ORDER — NOREPINEPHRINE 4 MG/250ML-% IV SOLN
0.0000 ug/min | INTRAVENOUS | Status: DC
  Administered 2018-06-07: 60 ug/min via INTRAVENOUS
  Filled 2018-06-07 (×3): qty 250

## 2018-06-07 MED ORDER — CHLORHEXIDINE GLUCONATE 0.12% ORAL RINSE (MEDLINE KIT)
15.0000 mL | Freq: Two times a day (BID) | OROMUCOSAL | Status: DC
  Administered 2018-06-07 – 2018-06-12 (×10): 15 mL via OROMUCOSAL

## 2018-06-07 MED ORDER — HYDROMORPHONE HCL 1 MG/ML IJ SOLN
0.5000 mg | Freq: Once | INTRAMUSCULAR | Status: AC
  Administered 2018-06-07: 0.5 mg via INTRAVENOUS
  Filled 2018-06-07: qty 0.5

## 2018-06-07 MED ORDER — MIDAZOLAM HCL 2 MG/2ML IJ SOLN
1.0000 mg | Freq: Once | INTRAMUSCULAR | Status: AC
  Administered 2018-06-07: 1 mg via INTRAVENOUS
  Filled 2018-06-07: qty 2

## 2018-06-07 MED ORDER — PRISMASOL BGK 4/2.5 32-4-2.5 MEQ/L IV SOLN
INTRAVENOUS | Status: DC
  Administered 2018-06-07 – 2018-06-16 (×71): via INTRAVENOUS_CENTRAL
  Filled 2018-06-07 (×88): qty 5000

## 2018-06-07 MED ORDER — ALTEPLASE 2 MG IJ SOLR: 2.0000 mg | Freq: Once | INTRAMUSCULAR | Status: DC | PRN

## 2018-06-07 MED ORDER — RITONAVIR 80 MG/ML PO SOLN
100.0000 mg | Freq: Every day | ORAL | Status: DC
  Filled 2018-06-07: qty 1.3

## 2018-06-07 MED ORDER — DEXTROSE 10 % IV SOLN
INTRAVENOUS | Status: DC
  Administered 2018-06-07: via INTRAVENOUS
  Administered 2018-06-08: 1 mL via INTRAVENOUS

## 2018-06-07 MED ORDER — SODIUM CHLORIDE 0.9 % FOR CRRT
INTRAVENOUS_CENTRAL | Status: DC | PRN
  Filled 2018-06-07: qty 1000

## 2018-06-07 MED ORDER — FENTANYL CITRATE (PF) 100 MCG/2ML IJ SOLN
INTRAMUSCULAR | Status: AC
  Filled 2018-06-07: qty 2

## 2018-06-07 MED ORDER — DEXTROSE 50 % IV SOLN
INTRAVENOUS | Status: AC
  Administered 2018-06-07: 25 mL via INTRAVENOUS
  Filled 2018-06-07: qty 50

## 2018-06-07 MED ORDER — ARTIFICIAL TEARS OPHTHALMIC OINT: TOPICAL_OINTMENT | OPHTHALMIC | Status: DC | PRN

## 2018-06-07 MED ORDER — FAMOTIDINE IN NACL 20-0.9 MG/50ML-% IV SOLN
20.0000 mg | INTRAVENOUS | Status: DC
  Administered 2018-06-07 – 2018-06-16 (×10): 20 mg via INTRAVENOUS
  Filled 2018-06-07 (×10): qty 50

## 2018-06-07 MED ORDER — HEPARIN SODIUM (PORCINE) 1000 UNIT/ML DIALYSIS
1000.0000 [IU] | INTRAMUSCULAR | Status: DC | PRN
  Filled 2018-06-07: qty 6

## 2018-06-07 MED ORDER — LIDOCAINE HCL (PF) 1 % IJ SOLN
INTRAMUSCULAR | Status: AC
  Filled 2018-06-07: qty 30

## 2018-06-07 MED ORDER — HEPARIN BOLUS VIA INFUSION (CRRT)
1000.0000 [IU] | INTRAVENOUS | Status: DC | PRN
  Administered 2018-06-07: 1000 [IU] via INTRAVENOUS_CENTRAL
  Filled 2018-06-07: qty 1000

## 2018-06-07 MED ORDER — FENTANYL CITRATE (PF) 100 MCG/2ML IJ SOLN
50.0000 ug | Freq: Once | INTRAMUSCULAR | Status: AC
  Administered 2018-06-07: 25 ug via INTRAVENOUS

## 2018-06-07 MED ORDER — RITONAVIR 100 MG PO TABS
100.0000 mg | ORAL_TABLET | Freq: Every day | ORAL | Status: DC
  Filled 2018-06-07 (×2): qty 1

## 2018-06-07 MED ORDER — HEPARIN SODIUM (PORCINE) 1000 UNIT/ML IJ SOLN
2.8000 mL | Freq: Once | INTRAMUSCULAR | Status: DC
  Filled 2018-06-07: qty 3

## 2018-06-07 MED ORDER — HEPARIN (PORCINE) IN NACL 1000-0.9 UT/500ML-% IV SOLN
INTRAVENOUS | Status: DC | PRN
  Administered 2018-06-07: 500 mL

## 2018-06-07 MED ORDER — FENTANYL BOLUS VIA INFUSION
25.0000 ug | INTRAVENOUS | Status: DC | PRN
  Administered 2018-06-07 – 2018-06-10 (×5): 25 ug via INTRAVENOUS
  Filled 2018-06-07: qty 25

## 2018-06-07 MED ORDER — IPRATROPIUM-ALBUTEROL 0.5-2.5 (3) MG/3ML IN SOLN
3.0000 mL | Freq: Four times a day (QID) | RESPIRATORY_TRACT | Status: DC
  Administered 2018-06-07 – 2018-06-12 (×22): 3 mL via RESPIRATORY_TRACT
  Filled 2018-06-07 (×22): qty 3

## 2018-06-07 MED ORDER — MIDAZOLAM HCL 2 MG/2ML IJ SOLN
1.0000 mg | INTRAMUSCULAR | Status: DC | PRN
  Administered 2018-06-08 – 2018-06-10 (×7): 1 mg via INTRAVENOUS
  Filled 2018-06-07 (×5): qty 2

## 2018-06-07 MED ORDER — LIDOCAINE HCL (PF) 1 % IJ SOLN
INTRAMUSCULAR | Status: DC | PRN
  Administered 2018-06-07: 5 mL

## 2018-06-07 MED ORDER — HEPARIN (PORCINE) IN NACL 1000-0.9 UT/500ML-% IV SOLN
INTRAVENOUS | Status: AC
  Filled 2018-06-07: qty 500

## 2018-06-07 MED ORDER — DOPAMINE-DEXTROSE 3.2-5 MG/ML-% IV SOLN
5.0000 ug/kg/min | INTRAVENOUS | Status: DC
  Administered 2018-06-07: 20 ug/kg/min via INTRAVENOUS
  Administered 2018-06-08: 5 ug/kg/min via INTRAVENOUS
  Filled 2018-06-07 (×2): qty 250

## 2018-06-07 MED ORDER — DEXTROSE 50 % IV SOLN
25.0000 mL | Freq: Once | INTRAVENOUS | Status: AC
  Administered 2018-06-07: 25 mL via INTRAVENOUS

## 2018-06-07 MED ORDER — ORAL CARE MOUTH RINSE
15.0000 mL | OROMUCOSAL | Status: DC
  Administered 2018-06-07 – 2018-06-12 (×50): 15 mL via OROMUCOSAL

## 2018-06-07 MED ORDER — FAMOTIDINE IN NACL 20-0.9 MG/50ML-% IV SOLN: 20.0000 mg | Freq: Two times a day (BID) | INTRAVENOUS | Status: DC

## 2018-06-07 MED ORDER — DEXTROSE 50 % IV SOLN
50.0000 mL | Freq: Once | INTRAVENOUS | Status: AC
  Administered 2018-06-07: 50 mL via INTRAVENOUS

## 2018-06-07 MED ORDER — SODIUM BICARBONATE 8.4 % IV SOLN
INTRAVENOUS | Status: AC
  Filled 2018-06-07: qty 50

## 2018-06-07 MED ORDER — INSULIN ASPART 100 UNIT/ML ~~LOC~~ SOLN
1.0000 [IU] | SUBCUTANEOUS | Status: DC
  Administered 2018-06-07: 3 [IU] via SUBCUTANEOUS

## 2018-06-07 MED ORDER — PRISMASOL BGK 4/2.5 32-4-2.5 MEQ/L IV SOLN
INTRAVENOUS | Status: DC
  Administered 2018-06-07 – 2018-06-15 (×26): via INTRAVENOUS_CENTRAL
  Filled 2018-06-07 (×26): qty 5000

## 2018-06-07 MED ORDER — ACETAMINOPHEN 650 MG RE SUPP: 650.0000 mg | Freq: Four times a day (QID) | RECTAL | Status: DC | PRN

## 2018-06-07 MED ORDER — ROCURONIUM BROMIDE 50 MG/5ML IV SOLN
40.0000 mg | Freq: Once | INTRAVENOUS | Status: AC
  Administered 2018-06-07: 20 mg via INTRAVENOUS
  Filled 2018-06-07: qty 4

## 2018-06-07 MED ORDER — FENTANYL 2500MCG IN NS 250ML (10MCG/ML) PREMIX INFUSION
25.0000 ug/h | INTRAVENOUS | Status: DC
  Administered 2018-06-07: 50 ug/h via INTRAVENOUS
  Administered 2018-06-08: 150 ug/h via INTRAVENOUS
  Administered 2018-06-08: 200 ug/h via INTRAVENOUS
  Administered 2018-06-09 – 2018-06-11 (×4): 250 ug/h via INTRAVENOUS
  Filled 2018-06-07 (×8): qty 250

## 2018-06-07 MED ORDER — SODIUM CHLORIDE 0.9 % IJ SOLN
250.0000 [IU]/h | INTRAMUSCULAR | Status: DC
  Administered 2018-06-07: 250 [IU]/h via INTRAVENOUS_CENTRAL
  Administered 2018-06-08: 950 [IU]/h via INTRAVENOUS_CENTRAL
  Administered 2018-06-08: 750 [IU]/h via INTRAVENOUS_CENTRAL
  Administered 2018-06-09: 700 [IU]/h via INTRAVENOUS_CENTRAL
  Filled 2018-06-07 (×6): qty 2

## 2018-06-07 MED FILL — Medication: Qty: 1 | Status: AC

## 2018-06-07 SURGICAL SUPPLY — 9 items
CATH S G BIP PACING (SET/KITS/TRAYS/PACK) ×1 IMPLANT
CATH-GARD ARROW CATH SHIELD (MISCELLANEOUS) ×2
PACK CARDIAC CATHETERIZATION (CUSTOM PROCEDURE TRAY) ×2 IMPLANT
PINNACLE LONG 6F 25CM (SHEATH)
SHEATH BRITE TIP 6FR 35CM (SHEATH) ×1 IMPLANT
SHEATH INTRO PINNACLE 6F 25CM (SHEATH) IMPLANT
SHEATH PROBE COVER 6X72 (BAG) ×2 IMPLANT
SHIELD CATHGARD ARROW (MISCELLANEOUS) ×1 IMPLANT
WIRE EMERALD 3MM-J .035X150CM (WIRE) ×2 IMPLANT

## 2018-06-07 NOTE — Consult Note (Signed)
[name]  ZOX:[id number] DOB: 1966-05-30 DOA: 05/18/2018 PCP: Patient, No Pcp Per    LOS: 17 days   Reason for Consult / Chief Complaint:  Cardiac Arrest  Consulting MD and date:  [name] 06/15/2018  HPI/Summary of hospital stay:  Pt is encephelopathic; therefore, this HPI is obtained from chart review. [name] is a 52 y.o. male with PMH of NSTEMI, HTN, CAD, sCHF (echo from Aug 2019 with EF 15-20%), HIV on HAART, ESRD on HD TThS.  He was initially admitted 8/23 with NSTEMI.  On 8/26, had cath that showed multivessel disease; therefore, on 9/3, had PCI to LAD and was also being worked up for a TAVR that he was scheduled to receive 06/24/2018.  On evening of 9/9, he was noted to have PEA arrest that lasted roughly 2 minutes before ROSC.  EKG concerning for 3rd degree heart block.  Had recurrent arrest later that night and was started on transcutaneous pacing.  He remained conscious following both episodes.  Early AM 9/9, PCCM called for assistance with medical management.  Subjective:    Objective   Blood pressure (!) 112/99, pulse (!) 54, temperature 97.6 F (36.4 C), temperature source Oral, resp. rate (!) 36, height 5\' 11"  (1.803 m), weight 68.9 kg, SpO2 100 %.        Intake/Output Summary (Last 24 hours) at 06/19/2018 0641 Last data filed at 06/06/2018 1704 Gross per 24 hour  Intake 240 ml  Output -  Net 240 ml   Filed Weights   06/04/18 0438 06/05/18 0500 06/06/18 0454  Weight: 67.3 kg 61.1 kg 68.9 kg    Examination: General: middle aged male critically ill HENT: /AT, PERRL, no JVD Lungs: Coarse bilateral Cardiovascular: Paced rhythm 70bpm Abdomen: Soft, non-tender, non-distended Extremities: No acute deformity Neuro: Sedate  Consults: date of consult/date signed off & final recs:  Cardiology 8/23 >  Nephrology 8/24 >  CVTS 8/27 >   Procedures: [facility] 8/26 > 1st  diag lesion 100% stenosed, prox LAD to mid LAD 95% stenosed, 2nd diag lesion  99% stenosed, 1st Mrg lesion 90% stenosed, prox RCA to mid RCA 20% stenosed, mid RCA lesion 20% stenosed.  Significant Diagnostic Tests: CTA chest 8/30 > enlarged PA, cardiomegaly, CAD.  Paraseptal and centrilobular emphysema, trace right pleural effusion, mild pulmonary edema, Enlarged LAD in mediastinum and retroperitoneal nodal stations, possibly low grade developing lymphoproliferative process. Carotid duplex 8/31 > no acute process. CXR 9/9 > pulmonary edema.  Micro Data: Blood 9/9  Antimicrobials:    Resolved Hospital Problem list    Assessment & Plan:   Cardiac arrest - PEA:  Etiology not entirely clear, but he does have known CAD and is s/p PCI a few days ago. ESRD and significant aortic stenosis. Seems like he now has pulmonary edema. Suspect this is likely metabolic in nature. - Transfer to ICU - Would like to do EKG, but is being TC paced.  - Trend troponin - Repeat labs, abg - Not a great candidate for induced hypothermia. Will target normothermia. Can use arctic sun if needed.   Complete Heart Block - Transcutaneous pacing - Going to cath lab for transvenous pacemaker.  - Cardiology following.   Acute hypoxemic respiratory failure s/p cardiac arrest: likely due to pulmonary edema - Full vent support - Needing increased PEEP/FiO2 (100% and 18 Currently) - CXR for ETT placement - Art line placed, assess ABG - VAP prevention bundle  Cardiogenic shock: Known HFrEF with LVEF 25-30%.  -  Telemetry monitoring - MAP goal > 65 mmHg - Norepinephrine for MAP goal - Adding dopamine - Cardiology following  NSTEMI: was admitted for this and is s/p PCI - per cardiology   Aortic stenosis - For TAVR 9/10, suspect that will be delayed given the acute change in his condition.   ESRD on HD:  Missed HD on 9/7. - Nephrology following - Access placed for CVVHD, which should start today.  - Daily chemistry   HIV - Continue home ART medications via tube.   COPD -  Duonebs   Disposition / Summary of Today's Plan 06/28/2018    Acutely worse overnight. Several episodes of PEA arrest. Unclear what inciting event was. He did have what looked like a third degree block on telemetry and has required pacing. No clear objective metabolic rationale, but he was transiently hypoxic. Could be primary cardiac as well.   Today we will support him with vent and pressors. Maintain RASS -2. Avoid fever, even if that means using arctic sun. He is going to cath lab for temp pacer. HD access placed for CRRT. Will await plans by cardiology.  Best Practice / Goals of Care / Disposition.   DVT prophylaxis: enoxaparin GI prophylaxis: pepcid Diet: NPO Mobility:BR Code Status: Full Family Communication: wife updated bedside  Labs   CBC: Recent Labs  Lab 06/13/2018 0715 06/02/18 0655 06/03/18 0359 06/04/18 0555  WBC 6.7 6.6 6.1 8.0  NEUTROABS  --   --   --  5.5  HGB 10.6* 11.5* 10.9* 11.0*  HCT 33.6* 36.7* 34.1* 34.5*  MCV 110.2* 110.5* 107.6* 108.2*  PLT 95* 96* 112* 105*   Basic Metabolic Panel: Recent Labs  Lab 06/03/18 0359  06/04/18 0555 06/05/18 0433 06/06/18 2235 05/30/2018 0243 06/23/2018 0529  NA 137  --  137 138 139 142 147*  K 6.5*   < > 4.0 4.2 4.3 3.8 4.7  CL 94*  --  97* 98 99 103 100  CO2 23  --  23 23 18* 23 26  GLUCOSE 98  --  108* 87 112* 45* 84  BUN 77*  --  42* 62* 81* 74* 81*  CREATININE 7.93*  --  5.27* 6.55* 8.78* 8.50* 9.18*  CALCIUM 9.3  --  8.8* 8.7* 8.4* 7.4* 8.1*  MG  --   --   --   --  2.7*  --   --   PHOS 8.6*  --   --  4.7*  --  5.0*  --    < > = values in this interval not displayed.   GFR: Estimated Creatinine Clearance: 9.4 mL/min (A) (by C-G formula based on SCr of 9.18 mg/dL (H)). Recent Labs  Lab 06/22/2018 0715 06/02/18 0655 06/03/18 0359 06/04/18 0555 06/06/18 2235  WBC 6.7 6.6 6.1 8.0  --   LATICACIDVEN  --   --   --   --  4.2*   Liver Function Tests: Recent Labs  Lab 06/03/18 0359 06/05/18 0433  06/22/2018 0243  ALBUMIN 3.4* 3.1* 2.4*   No results for input(s): LIPASE, AMYLASE in the last 168 hours. No results for input(s): AMMONIA in the last 168 hours. ABG    Component Value Date/Time   PHART 7.403 05/26/2018 1526   PCO2ART 33.5 05/29/2018 1526   PO2ART 143.0 (H) 05/09/2018 1526   HCO3 16.0 (L) 06/06/2018 2236   TCO2 25 05/27/2018 1533   TCO2 23 05/26/2018 1533   ACIDBASEDEF 7.3 (H) 06/06/2018 2236   O2SAT 97.9 06/06/2018 2236  Coagulation Profile: No results for input(s): INR, PROTIME in the last 168 hours. Cardiac Enzymes: No results for input(s): CKTOTAL, CKMB, CKMBINDEX, TROPONINI in the last 168 hours. HbA1C: Hgb A1c MFr Bld  Date/Time Value Ref Range Status  05/23/2018 07:07 PM 4.5 (L) 4.8 - 5.6 % Final    Comment:    (NOTE) Pre diabetes:          5.7%-6.4% Diabetes:              >6.4% Glycemic control for   <7.0% adults with diabetes    CBG: Recent Labs  Lab 06/02/18 1528 06/02/18 1637 06/04/18 0827 06/09/2018 0538  GLUCAP 118* 143* 90 96     Review of Systems:    Past medical history  He,  has a past medical history of Anal condyloma, Anemia, Aortic stenosis, Coronary artery disease, Diarrhea, DJD (degenerative joint disease), ESRD (end stage renal disease) on dialysis (HCC) (01/02/2012), Family history of disseminated HSV infection, Genital herpes, Hemodialysis patient (HCC), History of blood transfusion, HIV infection (HCC), Hypertension, NSTEMI (non-ST elevation myocardial infarction) (HCC), Tertiary syphilis, and Thrombosis of dialysis vascular access (HCC) (01/02/2012).   Surgical History    Past Surgical History:  Procedure Laterality Date  . A/V SHUNTOGRAM Left 04/15/2017   Procedure: A/V Shuntogram;  Surgeon: Maeola Harman, MD;  Location: [facility] INVASIVE CV LAB;  Service: Cardiovascular;  Laterality: Left;  . APPENDECTOMY    . AV FISTULA PLACEMENT Right 06/16/11  . AV FISTULA PLACEMENT Left 03/17/2016   Procedure: INSERTION OF  ARTERIOVENOUS (AV) GORE-TEX GRAFT ARM USING GORETEX 4-7MM X 45CM STRETCH GRAFT;  Surgeon: Sherren Kerns, MD;  Location: [facility] OR;  Service: Vascular;  Laterality: Left;  . AV FISTULA PLACEMENT, BRACHIOCEPHALIC Right 06/16/11  . CARDIAC CATHETERIZATION  06/28/2018  . CORONARY ATHERECTOMY N/A 06/23/2018   Procedure: CORONARY ATHERECTOMY;  Surgeon: Kathleene Hazel, MD;  Location: [facility] INVASIVE CV LAB;  Service: Cardiovascular;  Laterality: N/A;  . CORONARY STENT INTERVENTION  06/23/2018  . CORONARY STENT INTERVENTION N/A 06/14/2018   Procedure: CORONARY STENT INTERVENTION;  Surgeon: Kathleene Hazel, MD;  Location: [facility] INVASIVE CV LAB;  Service: Cardiovascular;  Laterality: N/A;  . ESOPHAGOGASTRODUODENOSCOPY N/A 12/09/2016   Procedure: ESOPHAGOGASTRODUODENOSCOPY (EGD);  Surgeon: Kathi Der, MD;  Location: [facility] ENDOSCOPY;  Service: Gastroenterology;  Laterality: N/A;  . ESOPHAGOGASTRODUODENOSCOPY N/A 01/05/2017   Procedure: ESOPHAGOGASTRODUODENOSCOPY (EGD);  Surgeon: Vida Rigger, MD;  Location: [facility] ENDOSCOPY;  Service: Endoscopy;  Laterality: N/A;  . ESOPHAGOGASTRODUODENOSCOPY (EGD) WITH PROPOFOL N/A 01/06/2017   Procedure: ESOPHAGOGASTRODUODENOSCOPY (EGD) WITH PROPOFOL;  Surgeon: Kathi Der, MD;  Location: [facility] ENDOSCOPY;  Service: Gastroenterology;  Laterality: N/A;  . INCISION AND DRAINAGE ABSCESS     abdominal  . PERIPHERAL VASCULAR CATHETERIZATION N/A 03/07/2016   Procedure: Venogram;  Surgeon: Sherren Kerns, MD;  Location: [facility] INVASIVE CV LAB;  Service: Cardiovascular;  Laterality: N/A;  . PERIPHERAL VASCULAR INTERVENTION Left 04/15/2017   Procedure: Peripheral Vascular Intervention;  Surgeon: Maeola Harman, MD;  Location: [facility] INVASIVE CV LAB;  Service: Cardiovascular;  Laterality: Left;  . REVISON OF ARTERIOVENOUS FISTULA Right 04/20/2013   Procedure: INSERTION OF ARTERIOVENOUS GORTEX GRAFT;  Surgeon: Sherren Kerns, MD;  Location: [facility] OR;  Service: Vascular;  Laterality:  Right;  Ultrasound Guided  . RIGHT/LEFT HEART CATH AND CORONARY ANGIOGRAPHY N/A 05/25/2018   Procedure: RIGHT/LEFT HEART CATH AND CORONARY ANGIOGRAPHY;  Surgeon: Lennette Bihari, MD;  Location: [facility] INVASIVE CV LAB;  Service: Cardiovascular;  Laterality: N/A;  . TEE WITHOUT  CARDIOVERSION N/A 05/05/2018   Procedure: TRANSESOPHAGEAL ECHOCARDIOGRAM (TEE);  Surgeon: Chilton Si, MD;  Location: [facility] ENDOSCOPY;  Service: Cardiovascular;  Laterality: N/A;  . TONSILLECTOMY       Social History   Social History   Socioeconomic History  . Marital status: Married    Spouse name: Not on file  . Number of children: Not on file  . Years of education: Not on file  . Highest education level: Not on file  Occupational History  . Not on file  Social Needs  . Financial resource strain: Not on file  . Food insecurity:    Worry: Not on file    Inability: Not on file  . Transportation needs:    Medical: Not on file    Non-medical: Not on file  Tobacco Use  . Smoking status: Heavy Tobacco Smoker    Packs/day: 0.50    Years: 27.00    Pack years: 13.50    Types: Cigarettes  . Smokeless tobacco: Never Used  . Tobacco comment: currently smoking ~ 1/3 ppd.  Substance and Sexual Activity  . Alcohol use: No    Alcohol/week: 0.0 standard drinks    Comment: 03/04/2013 "stopped drinking ~ 12 yr ago; never had problem w/it".    . Drug use: No  . Sexual activity: Not Currently    Partners: Female    Birth control/protection: Condom    Comment: declined condoms  Lifestyle  . Physical activity:    Days per week: Not on file    Minutes per session: Not on file  . Stress: Not on file  Relationships  . Social connections:    Talks on phone: Not on file    Gets together: Not on file    Attends religious service: Not on file    Active member of club or organization: Not on file    Attends meetings of clubs or organizations: Not on file    Relationship status: Not on file  . Intimate partner violence:     Fear of current or ex partner: Not on file    Emotionally abused: Not on file    Physically abused: Not on file    Forced sexual activity: Not on file  Other Topics Concern  . Not on file  Social History Narrative   Lives in [place] with wife.  ,  reports that he has been smoking cigarettes. He has a 13.50 pack-year smoking history. He has never used smokeless tobacco. He reports that he does not drink alcohol or use drugs.   Family history   His family history includes Arthritis in his father; Diabetes in his mother.   Allergies Allergies  Allergen Reactions  . Iodinated Diagnostic Agents Hives, Itching and Rash    "skin rash - severe" per outside records  . Penicillins Hives and Other (See Comments)    Rx required Hospitalization Has patient had a PCN reaction causing immediate rash, facial/tongue/throat swelling, SOB or lightheadedness with hypotension: Yes Has patient had a PCN reaction causing severe rash involving mucus membranes or skin necrosis: No Has patient had a PCN reaction that required hospitalization Yes Has patient had a PCN reaction occurring within the last 10 years: No   . Shellfish-Derived Products Nausea Only    Irritates the stomach  . Acetaminophen-Codeine Nausea And Vomiting  . Tape Rash and Other (See Comments)    Use paper tape only (NO CLEAR PLASTIC TAPE!!)    Home meds  Prior to Admission medications   Medication Sig  Start Date End Date Taking? Authorizing Provider  albuterol (PROVENTIL HFA;VENTOLIN HFA) 108 (90 Base) MCG/ACT inhaler Inhale 2 puffs into the lungs every 6 (six) hours as needed for wheezing or shortness of breath.   Yes [provider]  calcium acetate (PHOSLO) 667 MG capsule Take 2,668 mg by mouth See admin instructions. Take 2,668 mg by mouth three times a day with meals and 2,668 mg with each snack   Yes [provider]  darunavir (PREZISTA) 800 MG tablet Take 1 tablet (800 mg total) by mouth at bedtime. 02/08/18  Yes  Cliffton [name], MD  ibuprofen (ADVIL,MOTRIN) 200 MG tablet Take 600 mg by mouth every 6 (six) hours as needed (for pain or headaches).    Yes [provider]  Multiple Vitamins-Minerals (MULTIVITAMIN PO) Take 1 tablet by mouth daily.   Yes [provider]  ritonavir (NORVIR) 100 MG TABS tablet TAKE 1 TABLET BY MOUTH AT BEDTIME. TAKE WITH PREZISTA TABLET 02/08/18  Yes Cliffton [name], MD  tenofovir (VIREAD) 300 MG tablet TAKE 1 TABLET BY MOUTH ONCE A WEEK ON SUNDAYS AT BEDTIME 02/08/18  Yes Cliffton [name], MD  TIVICAY 50 MG tablet TAKE 1 TABLET(50 MG) BY MOUTH AT BEDTIME 02/08/18  Yes Cliffton [name], MD  acyclovir (ZOVIRAX) 400 MG tablet Take 400 mg tablet in evening after each HD session for the next week. Patient not taking: Reported on 05/07/2018 10/10/17   Arvilla Market, DO  darunavir (PREZISTA) 800 MG tablet TAKE 1 TABLET BY MOUTH AT BEDTIME WITH NORVIR Patient not taking: Reported on 05/13/2018 10/22/17   Cliffton [name], MD  docusate sodium (COLACE) 100 MG capsule Take 1 capsule (100 mg total) by mouth daily. Patient not taking: Reported on 05/11/2018 12/10/16   Renne Musca, MD  dolutegravir (TIVICAY) 50 MG tablet Take 1 tablet (50 mg total) by mouth at bedtime. Patient not taking: Reported on 05/20/2018 10/22/17   Cliffton [name], MD  hydrOXYzine (ATARAX/VISTARIL) 25 MG tablet Take 1 tablet (25 mg total) by mouth every 6 (six) hours as needed for itching. Patient not taking: Reported on 05/18/2018 10/28/17   Wendee Beavers, DO  pantoprazole (PROTONIX) 40 MG tablet TAKE 1 TABLET BY MOUTH TWICE DAILY Patient not taking: Reported on 05/15/2018 01/08/17   Almon Hercules, MD

## 2018-06-07 NOTE — Progress Notes (Signed)
[facility]  Date: 06/22/2018 Time: 7:42 PM  I was alerted by RN that patient became acutely bradycardic with loss of pacemaker capture.  Telemetry shows complete heart block with junctional escape (HR ~40 bpm).  Temporary transvenous pacing wire found to have been retracted ~10 cm from prior position.  With balloon inflated, the wire was advanced until capture was obtained.  Threshold was found to be 3 mA.  Pacemaker was set to 5 mA output with continued rate of 70 bpm.  STAT CXR ordered.  Patient remains intubated on multiple vasoactive medications.  Yvonne Kendall, MD [facility] Pager: [phone]

## 2018-06-07 NOTE — Progress Notes (Signed)
I received a request to follow-up with the patient and family from the [place]. During the morning hours, I went to visit with the patient but the patient was out of the room. I visited with the patient and his wife during the afternoon.  I provided spiritual support by offering prayer and pastoral presence with the family. I shared that the Chaplain is available for additional support as needed or requested.    06/13/2018 1600  Clinical Encounter Type  Visited With Patient and family together  Visit Type Follow-up;Spiritual support  Referral From Nurse  Consult/Referral To Chaplain  Spiritual Encounters  Spiritual Needs Prayer  Stress Factors  Patient Stress Factors None identified  Family Stress Factors Exhausted   Chaplain Dr Melvyn Novas

## 2018-06-07 NOTE — Brief Op Note (Signed)
BRIEF CARDIAC CATHETERIZATION NOTE  DATE: 06/09/2018 TIME: 9:59 AM  PATIENT:  [name]  52 y.o. male  PRE-OPERATIVE DIAGNOSIS:  Complete heart block  POST-OPERATIVE DIAGNOSIS:  Complete heart block  PROCEDURE:  Procedure(s): TEMPORARY PACEMAKER (N/A)  SURGEON:  Surgeon(s) and Role:    Yvonne Kendall, MD - Primary  FINDINGS: 1. Successful placement of 36F temporary transvenous pacemaker via the right femoral vein. 2. Pacing threshold: 1 mA  RECOMMENDATIONS: 1. Maintain rate 70 bpm, output 5 mA. 2. Obtain PCXR upon arrival in ICU.  Yvonne Kendall, MD [facility] Pager: [phone]

## 2018-06-07 NOTE — Progress Notes (Addendum)
Hypoglycemic Event  CBG: cbg-43  Treatment: D50 IV 25 mL  Symptoms: None  Follow-up CBG: Time:2042 CBG Result:91  Possible Reasons for Event: Inadequate meal intake  Comments/MD notified:per protocol    [name]

## 2018-06-07 NOTE — Progress Notes (Signed)
[facility] Progress Note   Subjective:  Called to see patient for CHB/ cardiac arrest. Pt dyspneic, poorly responsive, BP's 80's- 100's.    Objective Vitals:   06/10/2018 0400 06/10/2018 0455 06/28/2018 0504 06/14/2018 0509  BP: (!) 80/65 (!) 103/49 (!) 80/65 (!) 112/99  Pulse:      Resp: 13 13 (!) 22 (!) 36  Temp:      TempSrc:      SpO2: 100%     Weight:      Height:       Physical Exam General: Thin man, fatigued, TCP in place, paced rhythm Heart: RRR; 3/6 SEM Lungs: bilat rales, ^'d WOB Extremities: 1+ LE edema Dialysis Access: AVF + bruit  Additional Objective Labs: Basic Metabolic Panel: Recent Labs  Lab 06/03/18 0359  06/05/18 0433 06/06/18 2235 06/04/2018 0243 05/31/2018 0529  NA 137   < > 138 139 142 147*  K 6.5*   < > 4.2 4.3 3.8 4.7  CL 94*   < > 98 99 103 100  CO2 23   < > 23 18* 23 26  GLUCOSE 98   < > 87 112* 45* 84  BUN 77*   < > 62* 81* 74* 81*  CREATININE 7.93*   < > 6.55* 8.78* 8.50* 9.18*  CALCIUM 9.3   < > 8.7* 8.4* 7.4* 8.1*  PHOS 8.6*  --  4.7*  --  5.0*  --    < > = values in this interval not displayed.   Liver Function Tests: Recent Labs  Lab 06/03/18 0359 06/05/18 0433 06/22/2018 0243  ALBUMIN 3.4* 3.1* 2.4*   CBC: Recent Labs  Lab 05/30/2018 0715 06/02/18 0655 06/03/18 0359 06/04/18 0555  WBC 6.7 6.6 6.1 8.0  NEUTROABS  --   --   --  5.5  HGB 10.6* 11.5* 10.9* 11.0*  HCT 33.6* 36.7* 34.1* 34.5*  MCV 110.2* 110.5* 107.6* 108.2*  PLT 95* 96* 112* 105*   Medications: . sodium chloride Stopped (06/05/18 1531)  . sodium chloride Stopped (06/05/18 1531)  . ferric gluconate (FERRLECIT/NULECIT) IV Stopped (06/05/18 1531)   . angioplasty book   Does not apply Once  . aspirin  81 mg Oral Daily  . Chlorhexidine Gluconate Cloth  6 each Topical Q0600  . clopidogrel  75 mg Oral Daily  . darunavir  800 mg Oral Q supper  . dolutegravir  50 mg Oral Q supper  . enoxaparin (LOVENOX) injection  30 mg Subcutaneous Q24H  . feeding  supplement (PRO-STAT SUGAR FREE 64)  30 mL Oral BID  . gabapentin  100 mg Oral Daily  . metoprolol succinate  25 mg Oral Daily  . midazolam      . pantoprazole  40 mg Oral BID  . ritonavir  100 mg Oral Q supper  . rosuvastatin  10 mg Oral q1800  . sevelamer carbonate  1,600 mg Oral TID WC  . sodium chloride flush  3 mL Intravenous Q12H  . sodium chloride flush  3 mL Intravenous Q12H  . tenofovir  300 mg Oral Weekly  . tiotropium  18 mcg Inhalation Daily    Dialysis Orders: TTS [facility] 3.5h 66kg 2K/2.5Ca P4 LUE AVG Hep none -Hectorol 3 mcg IV TIW -Venofer 50mg  IV q weeks  -Mircera IV q 2 weeks last 8/20  -Ca acetate 4 tid qac--> stopped while hospitalized in light of hyperCa  Problem/Plan: 1. Cardiac arrest/ possible CHB - getting paced w/ TCP now. +edema on CXR. Doesn't look  stable to dialyze now, hypotensive/ poor resp effort. Have called CCM to assess. May need CRRT.   2. NSTEMI: S/P LHC showing severe CAD, HFrEF (EF 20-25%) severe calcified AoV. Now s/p PCA to mid LAD and sched for TAVR on 9/10. 3. ESRD: Usual TTS schedule, refused 9/7.  4. HTN/volume: BP chronically low. + edema/ vol overload now  5. Anemia of ESRD: Hgb 11, ESA held. Monitor. 6. Secondary hyperparathyroidism: Ca/Phos ok. Continue Renvela as binder. Off Phoslo and VDRA on hold. 7. HIV: Home meds per primary. 8. Nutrition: Alb low, continue pro-stat supplement. 9. HFrEF (EF 20-25%)   Caleb Moselle MD [facility] pager [phone]   06/06/2018, 6:37 AM

## 2018-06-07 NOTE — Progress Notes (Signed)
Prior to intubation, no Pox could be obtained which was presumed due to patient being a vasculopath. Multiple Pox positions were tried without results. Post-intubation, patient had Pox 96% with good waveform, good equal breath sounds. Levophed had been started empirically during intubation with the expectation that his BP would drop as he had a soft BP prior to intubation. Post intubation he was on Levophed with good BP.   At that time I stepped out of the room to write my intubation note and vent orders. I came back in 5 minutes later to check on the patient. RN's were at bedside giving report at that time. I noticed Pox now 51% with terrible waveform, BP now much much lower with MAP 35. I checked femoral pulse but could not find one. CPR started. Rhythm PEA. Required 10 minutes of CPR prior to attaining ROSC. See code notes for more details.   Cardiology at the bedside and plans to take patient for transvenous pacer now. CXR post intubation on my read shows ETT in good position, OG tube ending below diaphragm off field of view, still mild/moderate pulmonary edema not much changed from prior. Signed out to PCCM day team who plans to complete the consult note, and place CVC and Aline.   45 minutes nonprocedural critical care time  Milana Obey, MD [facility]monary [facility] Critical Care Medicine Pager: [phone]

## 2018-06-07 NOTE — Plan of Care (Signed)
  Problem: Health Behavior/Discharge Planning: Goal: Ability to manage health-related needs will improve Outcome: Not Progressing   Problem: Clinical Measurements: Goal: Complications related to the disease process, condition or treatment will be avoided or minimized Outcome: Not Progressing   Problem: Activity: Goal: Ability to tolerate increased activity will improve Outcome: Not Progressing   Problem: Cardiac: Goal: Ability to achieve and maintain adequate cardiopulmonary perfusion will improve Outcome: Not Progressing Note:  Patient has coded and is now intubated. FiO2 on ventilator has been increased to 80%. Patient is on 60 mcg Levophed and 15 mcg dopamine. Patient's MAP is approximately 60-65.

## 2018-06-07 NOTE — Plan of Care (Deleted)
  Problem: Health Behavior/Discharge Planning: Goal: Ability to manage health-related needs will improve Outcome: Progressing   Problem: Education: Goal: Understanding of CV disease, CV risk reduction, and recovery process will improve Outcome: Progressing   Problem: Health Behavior/Discharge Planning: Goal: Ability to safely manage health-related needs after discharge will improve Outcome: Progressing

## 2018-06-07 NOTE — Progress Notes (Signed)
Hypoglycemic Event  CBG: 39  Treatment: D50 IV 50 mL  Symptoms: None  Follow-up CBG: Time:2331 CBG Result:139  Possible Reasons for Event: Inadequate meal intake  Comments/MD notified:Dr. Arsenio Loader, D10@50cc /hr ordered.    [name]

## 2018-06-07 NOTE — Progress Notes (Signed)
Family Medicine Social Note:  We appreciate the great care of the primary team. Family medicine will follow along.  Peggyann Shoals, DO [facility], PGY-1 06/21/2018 6:56 PM

## 2018-06-07 NOTE — Progress Notes (Signed)
Code blue, asystole at 0455. Pt was at 34 mA on pacer, rate of 50 ppm. During code went up to a rate of 60, and mA up to 74. 1 amp Bicarb given, ROSC achieved. Dilaudid 0.5 mg IV, Versed 1 mg IV given.

## 2018-06-07 NOTE — Progress Notes (Signed)
ABG results obtained per MD order.  Patient respiratory rate had been increased from 20 to 26, by MD, prior to obtaining sample.  Per MD, based on following results, wean PEEP and FIO2 therefore PEEP was decreased from 14 to 10.  Will continue to monitor.    Ref. Range 06/15/2018 08:51  Sample type Unknown ARTERIAL  pH, Arterial Latest Ref Range: 7.350 - 7.450  7.438  pCO2 arterial Latest Ref Range: 32.0 - 48.0 mmHg 37.2  pO2, Arterial Latest Ref Range: 83.0 - 108.0 mmHg 455.0 (H)  TCO2 Latest Ref Range: 22 - 32 mmol/L 26  Acid-Base Excess Latest Ref Range: 0.0 - 2.0 mmol/L 1.0  Bicarbonate Latest Ref Range: 20.0 - 28.0 mmol/L 25.2  O2 Saturation Latest Units: % 100.0  Patient temperature Unknown HIDE

## 2018-06-07 NOTE — Procedures (Signed)
Hemodialysis Insertion Procedure Note DAXON GREASON [id number] Sep 02, 1966  Procedure: Insertion of Hemodialysis Catheter Type: 3 port  Indications: Hemodialysis   Procedure Details Consent: Unable to obtain consent because of emergent medical necessity. Time Out: Verified patient identification, verified procedure, site/side was marked, verified correct patient position, special equipment/implants available, medications/allergies/relevent history reviewed, required imaging and test results available.  Performed  Maximum sterile technique was used including antiseptics, cap, gloves, gown, hand hygiene, mask and sheet. Skin prep: Chlorhexidine; local anesthetic administered A antimicrobial bonded/coated triple lumen catheter was placed in the left femoral vein due to emergent situation using the Seldinger technique. Ultrasound guidance used.Yes.   Catheter placed to 20 cm. Blood aspirated via all 3 ports and then flushed x 3. Line sutured x 2 and dressing applied.  Evaluation Blood flow good Complications: No apparent complications Patient did tolerate procedure well. Chest X-ray ordered to verify placement.  CXR: pending.  Have ordered hep lock for HD ports  Joneen Roach, AGACNP-BC [facility] Pulmonology/Critical Care Pager [phone] or [phone]  05/31/2018 9:11 AM

## 2018-06-07 NOTE — Progress Notes (Signed)
Progress Note  Patient Name: [name] Date of Encounter: 06/18/2018  Primary Cardiologist: Thurmon Fair, MD   Subjective   Arrived this morning to patient being coded. Per team and chart review, patient had two codes overnight prior to the one this morning. Appears to have had prolonged episode of complete heart block with first code overnight (documentation at 10:24 PM, code time of 2 minutes), but then return to sinus rhythm. After second code at 5 AM (lasted 8 minutes), patient appeared to be predominantly transcutaneously paced. Overnight he has low oxygen saturations, and he was intubated around 7 AM. He initially have oxygen saturations in the 90s, but within minutes his O2 saturations dropped, and a pulse was not palpable. His pacer was capturing at that time, (third code PEA). After his rhythm returned, he again briefly conducted his own intrinsic rhythm with the use of atropine, but again he went back to transcutaneously pacing. He went to the cath lab and had a transvenous pacing wire placed.  Patient is currently intubated and sedated, wife at bedside.  Inpatient Medications    Scheduled Meds: . angioplasty book   Does not apply Once  . aspirin  81 mg Oral Daily  . Chlorhexidine Gluconate Cloth  6 each Topical Q0600  . clopidogrel  75 mg Oral Daily  . darunavir  800 mg Oral Q supper  . dolutegravir  50 mg Oral Q supper  . enoxaparin (LOVENOX) injection  30 mg Subcutaneous Q24H  . fentaNYL      . fentaNYL (SUBLIMAZE) injection  50 mcg Intravenous Once  . gabapentin  100 mg Oral Daily  . insulin aspart  1-3 Units Subcutaneous Q4H  . ipratropium-albuterol  3 mL Nebulization Q6H  . midazolam      . midazolam      . ritonavir  100 mg Oral Q supper  . rosuvastatin  10 mg Oral q1800  . sevelamer carbonate  1,600 mg Oral TID WC  . sodium chloride flush  3 mL Intravenous Q12H  . sodium chloride flush  3 mL Intravenous Q12H  . tenofovir  300 mg Oral Weekly    Continuous Infusions: . sodium chloride Stopped (06/05/18 1531)  . sodium chloride Stopped (06/05/18 1531)  . DOPamine    . famotidine (PEPCID) IV    . fentaNYL infusion INTRAVENOUS    . ferric gluconate (FERRLECIT/NULECIT) IV Stopped (06/05/18 1531)  . norepinephrine (LEVOPHED) Adult infusion 60 mcg/min (06/06/2018 0833)   PRN Meds: sodium chloride, sodium chloride, acetaminophen, fentaNYL, midazolam, midazolam, nitroGLYCERIN, ondansetron (ZOFRAN) IV, polyvinyl alcohol, sodium chloride flush, sodium chloride flush   Vital Signs    Vitals:   06/14/2018 0805 06/06/2018 0810 06/08/2018 0815 06/10/2018 0820  BP: (!) 76/61 91/64 98/75  104/74  Pulse: 81 (!) 59 (!) 55 (!) 56  Resp: 20 20 20 20   Temp:      TempSrc:      SpO2: 100% 94% 95% 94%  Weight:      Height:        Intake/Output Summary (Last 24 hours) at 06/27/2018 0837 Last data filed at 06/06/2018 1704 Gross per 24 hour  Intake 240 ml  Output -  Net 240 ml   Filed Weights   06/04/18 0438 06/05/18 0500 06/06/18 0454  Weight: 67.3 kg 61.1 kg 68.9 kg    Telemetry    As documented--sinus rhythm with RBBB/LPFB, then complete heart block prior to first code (see strip at 9:51 PM). Return to sinus rhythm, with occasional transcutaneous  pacing. Noted that he began to pace consistently at 4:46 AM prior to second code. He then paced continuously between second and third code (7:29 AM). He had brief return of his sinus rhythm and then reverted to transcutaneous pacing again. Transvenous wire placed this AM and has been transvenous paced nearly consistently since.   Physical Exam   GEN: intubated and sedated Neck: supple Cardiac: RRR, 3/6 systolic murmur Respiratory: mechanical vent sounds GI: Soft, nontender, non-distended  MS: LUE fistula Neuro:  sedated Psych: sedated  Labs    Chemistry Recent Labs  Lab 06/03/18 0359  06/05/18 0433 06/06/18 2235 06/22/2018 0243 06/11/2018 0529 06/08/2018 0750  NA 137   < > 138 139 142 147*  136  K 6.5*   < > 4.2 4.3 3.8 4.7 3.8  CL 94*   < > 98 99 103 100 98  CO2 23   < > 23 18* 23 26  --   GLUCOSE 98   < > 87 112* 45* 84 204*  BUN 77*   < > 62* 81* 74* 81* 74*  CREATININE 7.93*   < > 6.55* 8.78* 8.50* 9.18* 9.30*  CALCIUM 9.3   < > 8.7* 8.4* 7.4* 8.1*  --   ALBUMIN 3.4*  --  3.1*  --  2.4*  --   --   GFRNONAA 7*   < > 9* 6* 6* 6*  --   GFRAA 8*   < > 10* 7* 7* 7*  --   ANIONGAP 20*   < > 17* 22* 16* 21*  --    < > = values in this interval not displayed.     Hematology Recent Labs  Lab 06/02/18 0655 06/03/18 0359 06/04/18 0555 06/06/2018 0750  WBC 6.6 6.1 8.0  --   RBC 3.32* 3.17* 3.19*  --   HGB 11.5* 10.9* 11.0* 10.9*  HCT 36.7* 34.1* 34.5* 32.0*  MCV 110.5* 107.6* 108.2*  --   MCH 34.6* 34.4* 34.5*  --   MCHC 31.3 32.0 31.9  --   RDW 18.6* 18.5* 18.7*  --   PLT 96* 112* 105*  --    PCI 06/11/2018  Prox LAD lesion is 99% stenosed.  Post intervention, there is a 0% residual stenosis.  A drug-eluting stent was successfully placed.   1. Severe, heavily calcified stenosis in the mid LAD 2. Successful PTCA of the mid LAD with orbital atherectomy and stenting of the mid LAD.   Recommendations: Will continue DAPT with ASA and Plavix. Will continue planning for TAVR.  Recommend uninterrupted dual antiplatelet therapy with Aspirin 81mg  daily and Clopidogrel 75mg  daily for a minimum of 12 months (ACS - Class I recommendation).  Patient Profile     52 y.o. 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  Assessment & Plan    #cardiac arrest: initially appeared to be complete heart block, with subsequent arrests appearing consistent with PEA.  -now s/p transvenous  wire -recovery will determine planning for permanent system. History of ESRD/fistulas makes permanent transvenous pacing complex, but high pacing burden makes leadless system not ideal.  # coronary artery disease -S/P PCI to mLAD with orbital atherectomy on 06/07/2018 -continue DAPT with aspirin and clopidogrel -VT/VF not cause of arrest, no shocks required -troponin will be  elevated due to multiple rounds of chest compressions  # aortic stenosis/aortic insufficiency -TAVR plans on hold given cardiac arrests  # end-stage renal disease -dialysis per nephrology.  # cardiomyopathy -therapy on hold while on pressors  # HIV-Per primary care.  For questions or updates, please contact [facility] Please consult [email or web address] for contact info under Cardiology/STEMI.  CRITICAL CARE Patient is critically ill with multiple organ systems affected and requires high complexity decision making. Total critical care time: >90 minutes. This time includes gathering of history, evaluation of patient's response to treatment, examination of patient, review of laboratory and imaging studies, and coordination with consultants.      Patient remains critically ill, with recovery potential unclear. Spent time discussing plans with wife at bedside.  Signed, Jodelle Red, MD  06/10/2018, 8:37 AM

## 2018-06-07 NOTE — Progress Notes (Signed)
Patient transported from cath lab to room 2H22 without complications.

## 2018-06-07 NOTE — Procedures (Signed)
Endotracheal Intubation Procedure Note Indication for endotracheal intubation: impending respiratory failure Sedation: etomidate and midazolam Paralytic: rocuronium Equipment: Macintosh 3 laryngoscope blade and 7.29mm cuffed endotracheal tube; secured 24cm at the teeth Cricoid Pressure: yes Number of attempts: 1 ETT location confirmed by by auscultation, by CXR and ETCO2 monitor. Patient tolerated procedure well. CXR pending.

## 2018-06-07 NOTE — Progress Notes (Signed)
Pt overriding transvenous pacer. HR-120s, SBP-140s.  12 lead EKG done revealing ST.  Will leave transvenous pacer on as backup and wean dopamine for SBP>90 and MAP>65 per MD order.  Dr. Arsenio Loader notified of situation.

## 2018-06-07 NOTE — Progress Notes (Addendum)
Pt transvenous pacer stopped capturing.  Pt moving leg prior to this occurring.  HR-36(complete heart block), SBP-80s. Leg straightened and mA increased from 5 to 10 without success. Dr. Okey Dupre to bedside, advanced pacer through femoral sheath.  Pacer capturing again appropriately.  Rate-70, mA-5. Site redressed and taped.  Will continue to monitor pt.

## 2018-06-07 NOTE — Code Documentation (Signed)
  Patient Name: [name]   MRN: [id number]   Date of Birth/ Sex: [birth year] , male      Admission Date: 05/18/2018  Attending Provider: Tobey Grim, MD  Primary Diagnosis: Acute on chronic systolic heart failure [facility])   Indication: Pt was in his usual state of health until this PM, when he was noted to be unresponsive. Code blue was subsequently called. At the time of arrival on scene, ACLS protocol was underway.   Technical Description:  - CPR performance duration:  8 minutes  - Was defibrillation or cardioversion used? No   - Was external pacer placed? Yes  - Was patient intubated pre/post CPR? No   Medications Administered: Y = Yes; Blank = No Amiodarone    Atropine    Calcium    Epinephrine    Lidocaine    Magnesium     Norepinephrine    Phenylephrine    Sodium bicarbonate  Y  Vasopressin     Post CPR evaluation:  - Final Status - Was patient successfully resuscitated ? Yes - What is current rhythm? Transcutaneous pacing - What is current hemodynamic status? BP 102/66  Miscellaneous Information:  - Labs sent, including: BMP, Mag  - Primary team notified?  Yes  - Family Notified? Yes  - Additional notes/ transfer status:  Patient continued on transcutaneous pacer. Case discussed with nephrology who will diurese the patient early this am.     Demetri Kerman, Cathleen Corti, MD  06/01/2018, 5:09 AM

## 2018-06-07 NOTE — Progress Notes (Signed)
Per CCM and nephrology, pigtail can be used for vasoactive infusions.

## 2018-06-07 NOTE — Progress Notes (Signed)
Hypoglycemic Event  CBG: 61  Treatment: 25 mL D50 IV  Symptoms: UTA/Sedated/Intubated.  Follow-up CBG: Time: 1623  CBG 96  Possible Reasons for Event: NPO, Critically ill.  Comments/MD notified:MD aware from previous events.   Patient's wife at bedside. Education provided. Questions answered.    Almetta Lovely

## 2018-06-07 NOTE — Progress Notes (Signed)
  [facility]   MULTIDISCIPLINARY HEART VALVE TEAM  TAVR for tomorrow has been cancelled. The multidisciplinary valve team will continue to follow at a distance. If he recovers he may be a candidate in the future.   Cline Crock PA-C  [facility]    .

## 2018-06-07 NOTE — Progress Notes (Signed)
Attempted to obtain 12 lead EKG per order and internal medicine request; however unable to obtain due to pt being trans paced; leads not picking up any reliable rhythm

## 2018-06-07 NOTE — Progress Notes (Signed)
Code blue- pt went into asystole, CPR initiated. ROSC achieved after several minutes (see additional charting from code in pt chart). Given an amp of bicarb with concern for acidosis in earlier code, and 1 mg Epi, which Levophed drip infusing. BMP, lactic acid level labs sent, results pending. RN, RT, MD teams all at bedside and present for code.

## 2018-06-07 NOTE — Plan of Care (Signed)
  Problem: Education: Goal: Knowledge of General Education information will improve Description Including pain rating scale, medication(s)/side effects and non-pharmacologic comfort measures Outcome: Progressing   Problem: Health Behavior/Discharge Planning: Goal: Ability to manage health-related needs will improve Outcome: Progressing   Problem: Clinical Measurements: Goal: Ability to maintain clinical measurements within normal limits will improve Reactivated Goal: Will remain free from infection Reactivated Goal: Diagnostic test results will improve 06/26/2018 0037 by Baird Kay, RN Outcome: Progressing 06/18/2018 0037 by Baird Kay, RN Reactivated Goal: Respiratory complications will improve 06/20/2018 0037 by Baird Kay, RN Outcome: Progressing 05/31/2018 0037 by Baird Kay, RN Reactivated Goal: Cardiovascular complication will be avoided 06/03/2018 0037 by Baird Kay, RN Outcome: Progressing 06/15/2018 0037 by Baird Kay, RN Reactivated   Problem: Education: Goal: Understanding of CV disease, CV risk reduction, and recovery process will improve 06/03/2018 0037 by Baird Kay, RN Outcome: Progressing 06/21/2018 0035 by Baird Kay, RN Outcome: Progressing   Problem: Health Behavior/Discharge Planning: Goal: Ability to safely manage health-related needs after discharge will improve 06/19/2018 0037 by Baird Kay, RN Outcome: Progressing 06/06/2018 0035 by Baird Kay, RN Outcome: Progressing

## 2018-06-07 NOTE — Procedures (Signed)
Arterial Catheter Insertion Procedure Note [name] [id number] December 14, 1965  Procedure: Insertion of Arterial Catheter  Indications: Blood pressure monitoring  Procedure Details Consent: Unable to obtain consent because of emergent medical necessity. Time Out: Verified patient identification, verified procedure, site/side was marked, verified correct patient position, special equipment/implants available, medications/allergies/relevent history reviewed, required imaging and test results available.  Performed  Maximum sterile technique was used including antiseptics, cap, gloves, gown, hand hygiene, mask and sheet. Skin prep: Chlorhexidine; local anesthetic administered 20 gauge catheter was inserted into left femoral artery using the Seldinger technique. ULTRASOUND GUIDANCE USED: YES Evaluation Blood flow good; BP tracing good. Complications: No apparent complications.   [name], AGACNP-BC [facility] Pulmonology/Critical Care Pager [phone] or [phone]  06/15/2018 9:12 AM

## 2018-06-07 NOTE — Progress Notes (Signed)
PT Cancellation Note  Patient Details Name: [name] MRN: [id number] DOB: 07-Sep-1966   Cancelled Treatment:    Reason Eval/Treat Not Completed: Patient not medically ready(noted documentation of code this am. Await medical clearance)   Anjeanette Petzold B Annasofia Pohl 06/02/2018, 7:04 AM  Delaney Meigs, PT Acute Rehabilitation Services Pager: [phone] Office: [phone]

## 2018-06-07 NOTE — Interval H&P Note (Signed)
History and Physical Interval Note:  06/14/2018 9:04 AM  [name]  has presented today for temporary pacemaker, with the diagnosis of cardiac arrest and heart block. The various methods of treatment have been discussed with the patient and family. After consideration of risks, benefits and other options for treatment, the patient has consented to  Procedure(s): TEMPORARY PACEMAKER (N/A) as a surgical intervention .  The patient's history has been reviewed, patient examined.  Patient developed intermittent bradycardia with high-grade AVB as well as PE.  He was emergently intubated and remains transcutaneously paced.  I have reviewed the patient's chart and labs.  Questions were answered to the patient's wife's satisfaction.   Bridgid Printz

## 2018-06-07 NOTE — Progress Notes (Addendum)
FPTS Interim Progress Note:   Paged at 0506 that patient had a code blue called again. Went to evaluate patient. Pt went into asystole, started CPR, achieved ROSC in a few minutes. Given another amp of bicarb with concern for acidosis in earlier code. BMET, lactic acid level sent, and pending. HR labile and erratic, transcutaneous pacing initiated. Upon my evaluation, pt now alert and stable with continued pacing, HR 60's/MAP 59. Case was discussed by IM code team with nephrology, who thinks these episodes are unlikely related to acidosis and to discuss further with cardiology. Will speak with cardiology and monitor clinical status closely.  Leticia Penna, DO  PGY-1 Family Medicine   Update [phone]: Nephrology to evaluate and patient to be first for dialysis this am.   Update 0700: Nephrology evaluated, consulted CCM. CCM currently evaluating. Notified cardiology, Dr. Cristal Deer, who will be alerting day team rounding on patient this morning, with consideration for transvenous pacing.

## 2018-06-07 NOTE — Progress Notes (Signed)
CRITICAL VALUE ALERT  Critical Value:  Troponin 8.48                          Lactic 3.7  Date & Time Notied:  06/10/2018 1338  Provider Notified: Jonna Clark  Orders Received/Actions taken: Repeat Labs ordered.

## 2018-06-07 NOTE — Progress Notes (Signed)
8280 - Patient intubated by CCM. 2 versed, 20 etomidate, 40 rocuronium 0729- PEA. CPR initiated. Code blue called, Dr. Merlene Pulling at bedside. Code team activated and arrived to bedside. Multiple rounds of epinephrine and bicarbonate given. 0739 - ROSC achieved.  Wife was at bedside throughout code.

## 2018-06-08 ENCOUNTER — Encounter (HOSPITAL_COMMUNITY): Admission: EM | Disposition: E | Payer: Self-pay | Source: Home / Self Care | Attending: Family Medicine

## 2018-06-08 DIAGNOSIS — R579 Shock, unspecified: Secondary | ICD-10-CM

## 2018-06-08 DIAGNOSIS — N179 Acute kidney failure, unspecified: Secondary | ICD-10-CM

## 2018-06-08 DIAGNOSIS — R570 Cardiogenic shock: Secondary | ICD-10-CM

## 2018-06-08 DIAGNOSIS — I442 Atrioventricular block, complete: Secondary | ICD-10-CM

## 2018-06-08 LAB — RENAL FUNCTION PANEL
ANION GAP: 13 (ref 5–15)
Albumin: 2.8 g/dL — ABNORMAL LOW (ref 3.5–5.0)
Albumin: 2.4 g/dL — ABNORMAL LOW (ref 3.5–5.0)
Anion gap: 10 (ref 5–15)
BUN: 34 mg/dL — ABNORMAL HIGH (ref 6–20)
BUN: 20 mg/dL (ref 6–20)
CALCIUM: 8 mg/dL — AB (ref 8.9–10.3)
CHLORIDE: 100 mmol/L (ref 98–111)
CO2: 22 mmol/L (ref 22–32)
CO2: 22 mmol/L (ref 22–32)
CREATININE: 4.54 mg/dL — AB (ref 0.61–1.24)
Calcium: 7.9 mg/dL — ABNORMAL LOW (ref 8.9–10.3)
Chloride: 103 mmol/L (ref 98–111)
Creatinine, Ser: 2.9 mg/dL — ABNORMAL HIGH (ref 0.61–1.24)
GFR calc Af Amer: 28 mL/min — ABNORMAL LOW (ref >60)
GFR calc non Af Amer: 14 mL/min — ABNORMAL LOW (ref >60)
GFR calc non Af Amer: 24 mL/min — ABNORMAL LOW (ref >60)
GFR, EST AFRICAN AMERICAN: 16 mL/min — AB (ref >60)
GLUCOSE: 81 mg/dL (ref 70–99)
GLUCOSE: 75 mg/dL (ref 70–99)
Phosphorus: 3.5 mg/dL (ref 2.5–4.6)
Phosphorus: 2.8 mg/dL (ref 2.5–4.6)
Potassium: 4.7 mmol/L (ref 3.5–5.1)
Potassium: 5 mmol/L (ref 3.5–5.1)
SODIUM: 135 mmol/L (ref 135–145)
SODIUM: 135 mmol/L (ref 135–145)

## 2018-06-08 LAB — POCT ACTIVATED CLOTTING TIME
ACTIVATED CLOTTING TIME: 186 s
ACTIVATED CLOTTING TIME: 202 s
ACTIVATED CLOTTING TIME: 202 s
ACTIVATED CLOTTING TIME: 219 s
ACTIVATED CLOTTING TIME: 224 s
ACTIVATED CLOTTING TIME: 230 s
Activated Clotting Time: 180 seconds
Activated Clotting Time: 175 seconds
Activated Clotting Time: 180 seconds
Activated Clotting Time: 180 seconds
Activated Clotting Time: 197 seconds
Activated Clotting Time: 202 seconds
Activated Clotting Time: 219 seconds
Activated Clotting Time: 169 seconds
Activated Clotting Time: 180 seconds
Activated Clotting Time: 186 seconds
Activated Clotting Time: 191 seconds
Activated Clotting Time: 180 seconds
Activated Clotting Time: 230 seconds

## 2018-06-08 LAB — POCT I-STAT 3, ART BLOOD GAS (G3+)
ACID-BASE DEFICIT: 1 mmol/L (ref 0.0–2.0)
Bicarbonate: 25.3 mmol/L (ref 20.0–28.0)
O2 Saturation: 100 %
PH ART: 7.357 (ref 7.350–7.450)
TCO2: 27 mmol/L (ref 22–32)
pCO2 arterial: 44.4 mmHg (ref 32.0–48.0)
pO2, Arterial: 391 mmHg — ABNORMAL HIGH (ref 83.0–108.0)

## 2018-06-08 LAB — GLUCOSE, CAPILLARY
GLUCOSE-CAPILLARY: 71 mg/dL (ref 70–99)
GLUCOSE-CAPILLARY: 78 mg/dL (ref 70–99)
Glucose-Capillary: 80 mg/dL (ref 70–99)
Glucose-Capillary: 76 mg/dL (ref 70–99)
Glucose-Capillary: 72 mg/dL (ref 70–99)

## 2018-06-08 LAB — MAGNESIUM
MAGNESIUM: 2.3 mg/dL (ref 1.7–2.4)
MAGNESIUM: 2.3 mg/dL (ref 1.7–2.4)
Magnesium: 2.3 mg/dL (ref 1.7–2.4)

## 2018-06-08 LAB — APTT: APTT: 91 s — AB (ref 24–36)

## 2018-06-08 LAB — CBC
HEMATOCRIT: 36.6 % — AB (ref 39.0–52.0)
HEMOGLOBIN: 11.7 g/dL — AB (ref 13.0–17.0)
MCH: 34.5 pg — ABNORMAL HIGH (ref 26.0–34.0)
MCHC: 32 g/dL (ref 30.0–36.0)
MCV: 108 fL — ABNORMAL HIGH (ref 78.0–100.0)
PLATELETS: 98 10*3/uL — AB (ref 150–400)
RBC: 3.39 MIL/uL — ABNORMAL LOW (ref 4.22–5.81)
RDW: 18.6 % — AB (ref 11.5–15.5)
WBC: 20.3 10*3/uL — ABNORMAL HIGH (ref 4.0–10.5)

## 2018-06-08 LAB — PHOSPHORUS: Phosphorus: 3.1 mg/dL (ref 2.5–4.6)

## 2018-06-08 SURGERY — IMPLANTATION, AORTIC VALVE, TRANSCATHETER, FEMORAL APPROACH
Anesthesia: General | Site: Chest

## 2018-06-08 MED ORDER — VANCOMYCIN HCL 10 G IV SOLR: 1500.0000 mg | INTRAVENOUS | Status: DC

## 2018-06-08 MED ORDER — GABAPENTIN 250 MG/5ML PO SOLN
100.0000 mg | Freq: Every day | ORAL | Status: DC
  Administered 2018-06-08 – 2018-06-16 (×8): 100 mg via ORAL
  Filled 2018-06-08 (×11): qty 2

## 2018-06-08 MED ORDER — B COMPLEX-C PO TABS
1.0000 | ORAL_TABLET | Freq: Every day | ORAL | Status: DC
  Administered 2018-06-08 – 2018-06-12 (×5): 1
  Filled 2018-06-08 (×6): qty 1

## 2018-06-08 MED ORDER — VITAL AF 1.2 CAL PO LIQD
1000.0000 mL | ORAL | Status: DC
  Administered 2018-06-08: 1000 mL
  Administered 2018-06-09: 16:00:00
  Administered 2018-06-10: 1000 mL

## 2018-06-08 MED ORDER — SODIUM CHLORIDE 0.9 % IV SOLN: 2.0000 g | Freq: Three times a day (TID) | INTRAVENOUS | Status: DC

## 2018-06-08 MED ORDER — PRO-STAT SUGAR FREE PO LIQD: 30.0000 mL | Freq: Two times a day (BID) | ORAL | Status: DC

## 2018-06-08 MED ORDER — HEPARIN SODIUM (PORCINE) 5000 UNIT/ML IJ SOLN
5000.0000 [IU] | Freq: Three times a day (TID) | INTRAMUSCULAR | Status: DC
  Administered 2018-06-08 – 2018-06-09 (×3): 5000 [IU] via SUBCUTANEOUS
  Filled 2018-06-08 (×3): qty 1

## 2018-06-08 MED ORDER — VITAL HIGH PROTEIN PO LIQD: 1000.0000 mL | ORAL | Status: DC

## 2018-06-08 MED ORDER — SODIUM CHLORIDE 0.9 % IV SOLN
2.0000 g | Freq: Two times a day (BID) | INTRAVENOUS | Status: DC
  Administered 2018-06-08 – 2018-06-11 (×6): 2 g via INTRAVENOUS
  Filled 2018-06-08 (×7): qty 2

## 2018-06-08 MED ORDER — VANCOMYCIN HCL 10 G IV SOLR
1250.0000 mg | Freq: Once | INTRAVENOUS | Status: AC
  Administered 2018-06-08: 1250 mg via INTRAVENOUS
  Filled 2018-06-08: qty 1250

## 2018-06-08 MED ORDER — VANCOMYCIN HCL IN DEXTROSE 750-5 MG/150ML-% IV SOLN
750.0000 mg | INTRAVENOUS | Status: DC
  Administered 2018-06-09 – 2018-06-10 (×2): 750 mg via INTRAVENOUS
  Filled 2018-06-08 (×2): qty 150

## 2018-06-08 MED FILL — Nitroglycerin IV Soln 100 MCG/ML in D5W: INTRA_ARTERIAL | Qty: 10 | Status: AC

## 2018-06-08 NOTE — Progress Notes (Signed)
Zoll pads changed.

## 2018-06-08 NOTE — Plan of Care (Signed)
°  Problem: Cardiac: °Goal: Ability to achieve and maintain adequate cardiopulmonary perfusion will improve °Outcome: Not Progressing °  °

## 2018-06-08 NOTE — Progress Notes (Signed)
Subjective: Interval History:  Opens eyes to verbal stim  Objective: Vital signs in last 24 hours: Temp:  [94.5 F (34.7 C)-97.5 F (36.4 C)] 95.2 F (35.1 C) (09/10 0700) Pulse Rate:  [0-170] 98 (09/10 0700) Resp:  [0-27] 26 (09/10 0700) BP: (76-157)/(30-113) 101/73 (09/10 0700) SpO2:  [0 %-100 %] 100 % (09/10 0700) FiO2 (%):  [60 %-100 %] 80 % (09/10 0400) Weight:  [68.9 kg-71.7 kg] 71.7 kg (09/10 0600) Weight change:   Intake/Output from previous day: 09/09 0701 - 09/10 0700 In: 3465.9 [I.V.:3250.9; NG/GT:90; IV Piggyback:50] Out: 3426 [Emesis/NG output:450] Intake/Output this shift: No intake/output data recorded.  General appearance: on vent, sedated Resp: rales bilaterally and rhonchi bilaterally Cardio: S1, S2 normal and systolic murmur: systolic ejection 2/6, crescendo and decrescendo at 2nd left intercostal space GI: pos bs, but decreased, liver down 6 cm Extremities: edema 2+, AVF LUA  Lab Results: Recent Labs    06/14/2018 1210 06/19/2018 0347  WBC 19.3* 20.3*  HGB 11.5* 11.7*  HCT 35.6* 36.6*  PLT 129* 98*   BMET:  Recent Labs    06/19/2018 1458 06/13/2018 0347  NA 139 135  K 4.3 4.7  CL 100 100  CO2 22 22  GLUCOSE 58* 81  BUN 67* 34*  CREATININE 7.59* 4.54*  CALCIUM 8.2* 7.9*   No results for input(s): PTH in the last 72 hours. Iron Studies: No results for input(s): IRON, TIBC, TRANSFERRIN, FERRITIN in the last 72 hours.  Studies/Results: Dg Chest 1 View  Result Date: 06/11/2018 CLINICAL DATA:  Shortness of breath, cardiac arrest. EXAM: CHEST  1 VIEW COMPARISON:  06/06/2018. FINDINGS: Trachea is midline. Heart is enlarged. Thoracic aorta is calcified. Defibrillator pads overlie the left hemithorax. Bibasilar predominant mixed interstitial and airspace opacification. No definite pleural fluid. IMPRESSION: Pulmonary edema. Electronically Signed   By: Leanna Battles M.D.   On: 06/16/2018 08:25   Dg Chest Port 1 View  Result Date: 06/27/2018 CLINICAL  DATA:  Shortness of breath.  Pacemaker complications. EXAM: PORTABLE CHEST 1 VIEW COMPARISON:  06/22/2018 . FINDINGS: The endotracheal tube tip is well positioned above the carina. There is an enteric tube with tip in the stomach. Transvenous pacer device is identified with lead in the projection of the right ventricle. Mild cardiac enlargement. Similar appearance of diffuse pulmonary edema. Decrease in right pleural effusion. IMPRESSION: 1. Stable position of transvenous pacer device. 2. Persistent bilateral pulmonary edema pattern. 3. Decrease in right pleural effusion. Electronically Signed   By: Signa Kell M.D.   On: 06/16/2018 19:58   Dg Chest Port 1 View  Result Date: 06/12/2018 CLINICAL DATA:  Evaluate pacemaker leads. EXAM: PORTABLE CHEST 1 VIEW COMPARISON:  Portable chest x-ray of June 07, 2018 at 7:40 a.m. FINDINGS: A. Transvenous pacemaker electrode is present with the tip projecting in the region of the right ventricular apex. The electrode has been passed via the femoral approach. The lungs are adequately inflated. The interstitial markings remain increased. The right hemidiaphragm is obscured. The cardiac silhouette is enlarged and the pulmonary vascularity engorged and indistinct. The endotracheal tube tip projects approximately 3.6 cm above the carina. The esophagogastric tube tip and proximal port project below the GE junction. IMPRESSION: Reasonable positioning of the transvenous pacemaker electrode. External pacemaker defibrillator pads remain present. CHF with pulmonary interstitial and early alveolar edema. Probable small right pleural effusion. The endotracheal and esophagogastric tubes are in reasonable position radiographically. Electronically Signed   By: David  [place] M.D.   On: 06/03/2018 12:24  Dg Chest Port 1 View  Result Date: 06/19/2018 CLINICAL DATA:  Acute respiratory failure with hypoxia EXAM: PORTABLE CHEST 1 VIEW COMPARISON:  Portable chest x-ray of June 07, 2018 at 5:12 a.m. FINDINGS: There has been interval intubation of the trachea and esophagus. The endotracheal tube tip projects approximately 6.1 cm above the carina. The esophagogastric tube tip and proximal port project below the GE junction. The lungs are well-expanded. Confluent interstitial and airspace opacities are present bilaterally and more conspicuous. The cardiac silhouette is enlarged. The pulmonary vascularity is engorged and indistinct. There is calcification in the wall of the aortic arch. The observed bony thorax is unremarkable. There are radiodense vascular grafts in the left subclavian and axillary regions. IMPRESSION: CHF with interstitial and alveolar edema. Recent intubation of the trachea and esophagus with reasonable positioning of the support tubes. Thoracic aortic atherosclerosis. Electronically Signed   By: David  [place] M.D.   On: 06/20/2018 08:17   Dg Chest Port 1 View  Result Date: 06/06/2018 CLINICAL DATA:  Post code EXAM: PORTABLE CHEST 1 VIEW COMPARISON:  Portable exam 2313 hours compared to 05/18/2018 FINDINGS: External pacing leads. Enlargement of cardiac silhouette. Atherosclerotic calcification aorta. BILATERAL perihilar infiltrates question pulmonary edema versus infection. No pleural effusion or pneumothorax. Vascular stents identified at LEFT subclavian/axillary vessels as well as in the BILATERAL upper extremities. IMPRESSION: Enlargement of cardiac silhouette with BILATERAL perihilar infiltrates question pulmonary edema. Electronically Signed   By: Ulyses Southward M.D.   On: 06/06/2018 23:31    I have reviewed the patient's current medications.  Assessment/Plan: 1 ESRD CRRT giving good solute/acid/base/K control.  Vol xs , slowly better. 2 Cardiac arrest 3 CHB 4 AS 5 HIV 6 VDRF per CCM 7 Schock still serious issue. DA, NE 8 Anemia stable 9 platelets lower follow P CRRT, lower vol, pressors, vent, pacer,     LOS: 18 days   Jermanie Minshall 06/23/2018,7:37  AM

## 2018-06-08 NOTE — Progress Notes (Signed)
PT Cancellation Note  Patient Details Name: [name] MRN: [id number] DOB: [birth year]   Cancelled Treatment:    Reason Eval/Treat Not Completed: Patient not medically ready   Yer Olivencia B Zuzanna Maroney 06/20/2018, 7:01 AM  Delaney Meigs, PT Acute Rehabilitation Services Pager: [phone] Office: [phone]

## 2018-06-08 NOTE — Progress Notes (Signed)
MENDEL [name]  KCM:[id number] DOB: [birth year] DOA: 05/11/2018 PCP: Patient, No Pcp Per    LOS: 18 days   Reason for Consult / Chief Complaint:  Cardiac Arrest  Consulting MD and date:  [name] 05/31/2018  HPI/Summary of hospital stay:  Pt is encephelopathic; therefore, this HPI is obtained from chart review. [name] is a 52 y.o. male with PMH of NSTEMI, HTN, CAD, sCHF (echo from Aug 2019 with EF 15-20%), HIV on HAART, ESRD on HD TThS.  He was initially admitted 8/23 with NSTEMI.  On 8/26, had cath that showed multivessel disease; therefore, on 9/3, had PCI to LAD and was also being worked up for a TAVR that he was scheduled to receive 06/12/2018. On evening of 9/9, he was noted to have PEA arrest that lasted roughly 2 minutes before ROSC.  EKG concerning for 3rd degree heart block.  Had recurrent arrest later that night and was started on transcutaneous pacing.  He remained conscious following both episodes.  Early AM 9/9, PCCM called for assistance with medical management. Required intubation for pulmonary edema. Went for temp Temp venous Pacemaker on 9/9, returned to ICU CRRT started. Requiring high PEEP/FIO2 as well as high dose vasopressors for cardiogenic shock.  9/10: still positive volume status. WBCs remain up.  FiO2 and PEEP are improving.  He did vagal down and require escalation of pressors when we attempted to wean sedation.,  Once he was relaxed we are able to resume weaning pressors  Subjective:  He appears comfortable  Objective   Blood pressure 101/76, pulse (Abnormal) 110, temperature (Abnormal) 96.4 F (35.8 C), resp. rate (Abnormal) 24, height 5\' 11"  (1.803 m), weight 71.7 kg, SpO2 100 %.    Vent Mode: PRVC FiO2 (%):  [60 %-80 %] 60 % Set Rate:  [26 bmp] 26 bmp Vt Set:  [550 mL] 550 mL PEEP:  [10 cmH20] 10 cmH20 Plateau Pressure:  [31 cmH20-36 cmH20] 36 cmH20   Intake/Output Summary (Last 24 hours) at 06/06/2018 0934 Last data filed at 06/06/2018  0900 Gross per 24 hour  Intake 3308.82 ml  Output 3867 ml  Net -558.18 ml   Filed Weights   06/06/18 0454 06/25/2018 1305 05/31/2018 0600  Weight: 68.9 kg 68.9 kg 71.7 kg    Examination: General: Is a 52 year old African-American male he is currently sedated on the ventilator HEENT normocephalic atraumatic orally intubated no jugular venous distention Pulmonary: Coarse scattered rhonchi equal chest rise currently weaning PEEP and FiO2 Cardiac: Regular rate and rhythm systolic murmur appreciated Abd: soft not tender.  Positive bowel sounds no organomegaly Extremities: Warm and dry does have lower extremity edema strong pulses are palpable brisk capillary refill Neuro: Heavily sedated however on wake up assessment he does localize and gets restless.  No focal deficits have been appreciated GU: An uric  Consults: date of consult/date signed off & final recs:  Cardiology 8/23 >  Nephrology 8/24 >  CVTS 8/27 >   Procedures: [facility] 8/26 > 1st  diag lesion 100% stenosed, prox LAD to mid LAD 95% stenosed, 2nd diag lesion 99% stenosed, 1st Mrg lesion 90% stenosed, prox RCA to mid RCA 20% stenosed, mid RCA lesion 20% stenosed.  Significant Diagnostic Tests: CTA chest 8/30 > enlarged PA, cardiomegaly, CAD.  Paraseptal and centrilobular emphysema, trace right pleural effusion, mild pulmonary edema, Enlarged LAD in mediastinum and retroperitoneal nodal stations, possibly low grade developing lymphoproliferative process. Carotid duplex 8/31 > no acute process. CXR 9/9 > pulmonary edema.  Micro Data: Blood 9/9 Sputum 9/10>>>  Antimicrobials:    Resolved Hospital Problem list    Assessment & Plan:   Cardiac arrest - PEA w/ CHB:  Etiology not entirely clear, but he does have known CAD and is s/p PCI a few days ago. ESRD and significant aortic stenosis.  -? Metabolic vs Pulmonary edema as etiology. Or at least complicating factors.  -S/p transvenous maker 9/9. Has not required pacing since  last evening on 9/9 Plan Cont tele monitoring  W/ ESRD PPM may be challenging.  Hopefully there is a component of cardiac stenting and perhaps will only be needed short-term   Cardiogenic shock: Known HFrEF with LVEF 25-30%.  -pressor requirements: Had been improving until he had a vagal episode when sedation was weaned Plan We will place Flowtrack monitoring device to help optimize hemodynamics  ideally would like to wean dopamine first, then norepinephrine Continue volume removal guided by hemodynamics via CRRT  NSTEMI: was admitted for this and is s/p PCI and arthrectomy from the mid LAD Plan Continuing antiplatelet therapy with aspirin and Plavix Continue telemetry monitoring  Aortic stenosis -Was for TAVR 9/10 Plan To be determined.  Currently critically ill and not a candidate at this point this will need to be reevaluated  Acute hypoxemic respiratory failure s/p cardiac arrest: likely due to pulmonary edema -gas exchange improved.  -FIO2/PEEP requirements:  PCXR personally reviewed:  Plan Send sputum culture I think we can hold off on PCP unless his T cells are low Cont volume removal w/ CRRT Cont full vent support weaning PEEP/FIO2  ESRD on HD:  Missed HD on 9/7. -Started on CRRT on 9/9 Plan Continuing CRRT as guided by nephrology Continued volume removal as able   Intermittent fluid and electrolyte imbalance: Plan Trend chemistries  Leukocytosis  -likely reactive but could be infective.  -I suspect hypothermia is d/t CRRT Plan F/u BCs Ck sputum  Cont to trend  Mild anemia Plan Trend cbc   HIV; there is report of non-compliance w/ ARTs but last viral load was undetectable  Plan Continue ART medications via tube Repeat T-helper and HIV 1 RNA  COPD Plan Continuing duo nebs  Hypoglycemia Plan Adding tube feeds Trend cbgs   Disposition / Summary of Today's Plan 05/31/2018    53 year old male patient status post several PEA arrests.  Initially  was third-degree heart block.  Suspect multifactorial and secondary to recent MI, severely reduced systolic function, metabolic derangements, all acutely exacerbated by pulmonary edema and volume overload.  I am worried his systolic function is even more reduced following cardiac arrest.  As of morning following dialysis and hemodynamic support his FiO2/PEEP and blood pressure requirements had been improving.  I suspect at least some degree of his hypotension is secondary to sedation however when he tried to wake him up he had a vagal response, required his venous pacing, and pressor requirements once again rose.  I suspect this reflects the degree as of his acute heart strain.  We may need to give him more time to recover.  At this point we will continue supportive care, will try to optimize hemodynamics using the Flowtrack device particularly I am curious about his volume status which I think is overloaded and also ensuring adequate cardiac output as we wean pressors.  For now I think we should continue volume removal via CRRT, will check a sputum culture for completion, and continue supportive care.  I have updated his wife at the bedside.    Best  Practice / Goals of Care / Disposition.   DVT prophylaxis: enoxaparin GI prophylaxis: pepcid Diet: NPO, start tube feeds 9/10 Mobility:BR Code Status: Full Family Communication: wife updated bedside  Labs   CBC: Recent Labs  Lab 06/02/18 0655 06/03/18 0359 06/04/18 0555 06/03/2018 0750 06/16/2018 1210 06/28/2018 0347  WBC 6.6 6.1 8.0  --  19.3* 20.3*  NEUTROABS  --   --  5.5  --  15.4*  --   HGB 11.5* 10.9* 11.0* 10.9* 11.5* 11.7*  HCT 36.7* 34.1* 34.5* 32.0* 35.6* 36.6*  MCV 110.5* 107.6* 108.2*  --  106.0* 108.0*  PLT 96* 112* 105*  --  129* 98*   Basic Metabolic Panel: Recent Labs  Lab 06/05/18 0433 06/06/18 2235 06/04/2018 0243 06/24/2018 0529 06/14/2018 0750 06/16/2018 1210 06/03/2018 1458 06/18/2018 0347  NA 138 139 142 147* 136 140 139 135   K 4.2 4.3 3.8 4.7 3.8 4.2 4.3 4.7  CL 98 99 103 100 98 97* 100 100  CO2 23 18* 23 26  --  22 22 22   GLUCOSE 87 112* 45* 84 204* 208* 58* 81  BUN 62* 81* 74* 81* 74* 83* 67* 34*  CREATININE 6.55* 8.78* 8.50* 9.18* 9.30* 9.27* 7.59* 4.54*  CALCIUM 8.7* 8.4* 7.4* 8.1*  --  8.4* 8.2* 7.9*  MG  --  2.7*  --   --   --  2.5*  --  2.3  PHOS 4.7*  --  5.0*  --   --  6.9* 5.4* 3.5   GFR: Estimated Creatinine Clearance: 19.7 mL/min (A) (by C-G formula based on SCr of 4.54 mg/dL (H)). Recent Labs  Lab 06/03/18 0359 06/04/18 0555  06/25/2018 0529 06/28/2018 0759 06/22/2018 1210 06/23/2018 1458 05/31/2018 0347  WBC 6.1 8.0  --   --   --  19.3*  --  20.3*  LATICACIDVEN  --   --    < > 4.3* 7.2* 3.7* 2.5*  --    < > = values in this interval not displayed.   Liver Function Tests: Recent Labs  Lab 06/05/18 0433 06/25/2018 0243 06/19/2018 1210 06/09/2018 1458 06/05/2018 0347  ALBUMIN 3.1* 2.4* 3.0* 2.9* 2.8*   No results for input(s): LIPASE, AMYLASE in the last 168 hours. No results for input(s): AMMONIA in the last 168 hours. ABG    Component Value Date/Time   PHART 7.357 06/16/2018 0842   PCO2ART 44.4 06/13/2018 0842   PO2ART 391.0 (H) 05/30/2018 0842   HCO3 25.3 05/30/2018 0842   TCO2 27 06/10/2018 0842   ACIDBASEDEF 1.0 05/31/2018 0842   O2SAT 100.0 06/21/2018 0842    Coagulation Profile: No results for input(s): INR, PROTIME in the last 168 hours. Cardiac Enzymes: Recent Labs  Lab 06/22/2018 1210 06/14/2018 1805  TROPONINI 8.48* 7.29*   HbA1C: Hgb A1c MFr Bld  Date/Time Value Ref Range Status  05/04/2018 07:07 PM 4.5 (L) 4.8 - 5.6 % Final    Comment:    (NOTE) Pre diabetes:          5.7%-6.4% Diabetes:              >6.4% Glycemic control for   <7.0% adults with diabetes    CBG: Recent Labs  Lab 06/15/2018 2022 05/31/2018 2042 06/14/2018 2307 06/10/2018 2331 06/08/18 0351  GLUCAP 43* 91 39* 139* 80     Simonne Martinet ACNP-BC [facility] Pulmonary/Critical Care Pager # [phone]  OR # [phone] if no answer

## 2018-06-08 NOTE — Progress Notes (Signed)
Pharmacy Antibiotic Note  [name] is a 52 y.o. male admitted on 05/27/2018 with sepsis.  Pharmacy has been consulted for Vancomycin and Cefepime dosing. Patient with HIV and has ESRD with HD on Tuesday, Thursday, and Saturday prior to admission now on CRRT. Cultures are pending. WBC up 20.3. Hypothermic (96.1). Lactate 4.2 >>2.5.   Plan: Vancomycin 1250 mg IV x 1 then 750 mg IV daily on CRRT. Cefepime 2 gram IV every 12 hours on CRRT.  Monitor CRRT toleration, clinical status, and culture results.    Height: 5\' 11"  (180.3 cm) Weight: 158 lb 1.1 oz (71.7 kg) IBW/kg (Calculated) : 75.3  Temp (24hrs), Avg:96.1 F (35.6 C), Min:94.8 F (34.9 C), Max:97.5 F (36.4 C)  Recent Labs  Lab 06/02/18 0655  06/03/18 0359 06/04/18 0555  06/06/18 2235  06/23/2018 0529 06/03/2018 0750 05/30/2018 0759 06/04/2018 1210 06/27/2018 1458 06/10/2018 0347  WBC 6.6  --  6.1 8.0  --   --   --   --   --   --  19.3*  --  20.3*  CREATININE  --    < > 7.93* 5.27*   < > 8.78*   < > 9.18* 9.30*  --  9.27* 7.59* 4.54*  LATICACIDVEN  --   --   --   --   --  4.2*  --  4.3*  --  7.2* 3.7* 2.5*  --    < > = values in this interval not displayed.    Estimated Creatinine Clearance: 19.7 mL/min (A) (by C-G formula based on SCr of 4.54 mg/dL (H)).    Allergies  Allergen Reactions  . Iodinated Diagnostic Agents Hives, Itching and Rash    "skin rash - severe" per outside records  . Penicillins Hives and Other (See Comments)    Tolerated Cefepime 1/17 Rx required Hospitalization Has patient had a PCN reaction causing immediate rash, facial/tongue/throat swelling, SOB or lightheadedness with hypotension: Yes Has patient had a PCN reaction causing severe rash involving mucus membranes or skin necrosis: No Has patient had a PCN reaction that required hospitalization Yes Has patient had a PCN reaction occurring within the last 10 years: No   . Shellfish-Derived Products Nausea Only    Irritates the stomach  .  Acetaminophen-Codeine Nausea And Vomiting  . Tape Rash and Other (See Comments)    Use paper tape only (NO CLEAR PLASTIC TAPE!!)    Antimicrobials this admission: Vancomycin 9/10 >> Cefepime 9/10 >>  Dose adjustments this admission:   Microbiology results: 9/9 BCx: ngtd x1 day 9/10 Sputum:  8/23 MRSA PCR: negative  Thank you for allowing pharmacy to be a part of this patient's care.  Link Snuffer, PharmD, BCPS, BCCCP Clinical Pharmacist Clinical phone 06/28/2018 until 10:30PM - #[phone] Please refer to [facility] for [facility] Pharmacy numbers 05/30/2018 3:41 PM

## 2018-06-08 NOTE — Progress Notes (Signed)
Progress Note  Patient Name: [name] Date of Encounter: 06/18/2018  Primary Cardiologist: Sanda Klein, MD   Subjective   Patient is currently intubated and sedated, wife at bedside.  Overnight events: TV pacer with loss of capture yesterday evening, found to be retracted 10 cm. Repositioned with good capture, appropriate placement per chest x ray. Overnight his native rhythm overtook the pacer (23:19), with sinus tachycardia >100 bpm. Has not needed pacing since. Weaning dopamine, still on levophed.  Inpatient Medications    Scheduled Meds: . angioplasty book   Does not apply Once  . aspirin  81 mg Oral Daily  . chlorhexidine gluconate (MEDLINE KIT)  15 mL Mouth Rinse BID  . Chlorhexidine Gluconate Cloth  6 each Topical Q0600  . clopidogrel  75 mg Oral Daily  . darunavir  800 mg Oral Q supper  . dolutegravir  50 mg Oral Q supper  . gabapentin  100 mg Oral Daily  . heparin  2.8 mL Intracatheter Once  . insulin aspart  1-3 Units Subcutaneous Q4H  . ipratropium-albuterol  3 mL Nebulization Q6H  . mouth rinse  15 mL Mouth Rinse 10 times per day  . ritonavir  100 mg Oral Q supper  . rosuvastatin  10 mg Oral q1800  . sevelamer carbonate  1,600 mg Oral TID WC  . sodium chloride flush  3 mL Intravenous Q12H  . tenofovir  300 mg Oral Weekly   Continuous Infusions: . sodium chloride 10 mL/hr at 06/14/2018 0700  . dextrose 50 mL/hr at 06/10/2018 0700  . DOPamine 12 mcg/kg/min (06/20/2018 0700)  . famotidine (PEPCID) IV Stopped (06/06/2018 2236)  . fentaNYL infusion INTRAVENOUS 150 mcg/hr (06/03/2018 0700)  . ferric gluconate (FERRLECIT/NULECIT) IV Stopped (06/05/18 1531)  . heparin 10,000 units/ 20 mL infusion syringe 750 Units/hr (06/20/2018 0252)  . norepinephrine (LEVOPHED) Adult infusion 60 mcg/min (06/26/2018 0711)  . dialysis replacement fluid (prismasate) 800 mL/hr at 06/20/2018 0208  . dialysis replacement fluid (prismasate) 700 mL/hr at 06/16/2018 0400  . dialysate  (PRISMASATE) 2,000 mL/hr at 06/26/2018 0721  . sodium chloride     PRN Meds: sodium chloride, acetaminophen, alteplase, artificial tears, fentaNYL, heparin, heparin, midazolam, midazolam, nitroGLYCERIN, ondansetron (ZOFRAN) IV, polyvinyl alcohol, sodium chloride, sodium chloride flush   Vital Signs    Vitals:   06/25/2018 0400 06/03/2018 0500 06/02/2018 0600 06/05/2018 0700  BP: 105/81 106/72 111/77 101/73  Pulse: (!) 107 (!) 110 (!) 105 98  Resp: (!) 26 (!) 26 (!) 26 (!) 26  Temp: (!) 95.5 F (35.3 C) (!) 95.9 F (35.5 C) (!) 94.8 F (34.9 C) (!) 95.2 F (35.1 C)  TempSrc: Esophageal     SpO2: 100% 100% 100% 100%  Weight:   71.7 kg   Height:        Intake/Output Summary (Last 24 hours) at 06/18/2018 0732 Last data filed at 06/06/2018 0700 Gross per 24 hour  Intake 3465.92 ml  Output 3426 ml  Net 39.92 ml   Filed Weights   06/06/18 0454 06/25/2018 1305 06/10/2018 0600  Weight: 68.9 kg 68.9 kg 71.7 kg   Vent Mode: PRVC FiO2 (%):  [60 %-100 %] 80 % Set Rate:  [20 bmp-26 bmp] 26 bmp Vt Set:  [550 mL] 550 mL PEEP:  [10 cmH20-18 cmH20] 10 cmH20 Plateau Pressure:  [31 cmH20-38 cmH20] 31 cmH20  Telemetry    Personally reviewed: as above, paced with brief loss of capture, resumed capture with repositioning. At 23:19 patient had sinus tachycardia and has  remained in that rhythm since, without repeat pacing required.  Physical Exam   GEN: intubated and sedated Neck: supple Cardiac: RRR, 3/6 systolic murmur Respiratory: mechanical vent sounds GI: Soft, nontender, non-distended  MS: LUE fistula Neuro:  sedated Psych: sedated  Labs    Chemistry Recent Labs  Lab 06/06/2018 1210 06/25/2018 1458 06/16/2018 0347  NA 140 139 135  K 4.2 4.3 4.7  CL 97* 100 100  CO2 22 22 22  GLUCOSE 208* 58* 81  BUN 83* 67* 34*  CREATININE 9.27* 7.59* 4.54*  CALCIUM 8.4* 8.2* 7.9*  ALBUMIN 3.0* 2.9* 2.8*  GFRNONAA 6* 7* 14*  GFRAA 7* 9* 16*  ANIONGAP 21* 17* 13     Hematology Recent Labs  Lab  06/04/18 0555 05/30/2018 0750 06/09/2018 1210 06/07/2018 0347  WBC 8.0  --  19.3* 20.3*  RBC 3.19*  --  3.36* 3.39*  HGB 11.0* 10.9* 11.5* 11.7*  HCT 34.5* 32.0* 35.6* 36.6*  MCV 108.2*  --  106.0* 108.0*  MCH 34.5*  --  34.2* 34.5*  MCHC 31.9  --  32.3 32.0  RDW 18.7*  --  18.5* 18.6*  PLT 105*  --  129* 98*   PCI 06/26/2018  Prox LAD lesion is 99% stenosed.  Post intervention, there is a 0% residual stenosis.  A drug-eluting stent was successfully placed.   1. Severe, heavily calcified stenosis in the mid LAD 2. Successful PTCA of the mid LAD with orbital atherectomy and stenting of the mid LAD.   Recommendations: Will continue DAPT with ASA and Plavix. Will continue planning for TAVR.  Recommend uninterrupted dual antiplatelet therapy with Aspirin 81mg daily and Clopidogrel 75mg daily for a minimum of 12 months (ACS - Class I recommendation).  Patient Profile     52 y.o. 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  Assessment & Plan    #cardiac arrest: initially appeared to be complete heart block, with subsequent arrests appearing consistent with PEA.  -now s/p transvenous wire, has not needed pacing since 23:19 06/21/2018. Now in sinus tach -recovery will determine planning for permanent system. History of ESRD/fistulas makes permanent transvenous pacing complex, but high pacing burden makes leadless system not ideal. Also has low EF, now s/p PCI and potentially TAVR in the future. Question whether he would be a candidate for ICD as well. -not responding to commands for me but sedated this AM. May have had some purposeful responses overnight per nursing report. -still on high O2 requirements.  Question contribution of hypoxia to his 2nd/3rd cardiac arrests (though first arrest preceded by CHB).  # coronary artery disease -S/P PCI to mLAD with orbital atherectomy on 06/16/2018 -continue DAPT with aspirin and clopidogrel -VT/VF not cause of arrest, no shocks required -troponin will be elevated due to multiple rounds of chest compressions  # aortic stenosis/aortic insufficiency -TAVR plans on hold given cardiac arrests  # end-stage renal disease -dialysis per nephrology.  # cardiomyopathy -therapy on hold while on pressors. Weaning dopamine, still on levophed.  # HIV-per medicine  For questions or updates, please contact [facility] Please consult [email or web address] for contact info under Cardiology/STEMI.  CRITICAL CARE Patient is critically ill with multiple organ systems affected and requires high complexity decision making. Total critical care time: >45 minutes. This   time includes gathering of history, evaluation of patient's response to treatment, examination of patient, review of laboratory and imaging studies, and coordination with consultants.      Patient remains critically ill, with recovery potential unclear.   Signed, Bridgette Christopher, MD  06/23/2018, 7:32 AM    

## 2018-06-08 NOTE — Progress Notes (Signed)
Initial Nutrition Assessment  DOCUMENTATION CODES:   Not applicable  INTERVENTION:   Tube Feeding:  Vital AF 1.2 @ 60 ml/hr Provide 1728 kcals, 108 g of protein and 1166 mL of free water  Recommend holding/discontinuing phosphorus binder therapy (Renvela) while on CRRT   NUTRITION DIAGNOSIS:   Inadequate oral intake related to acute illness as evidenced by NPO status.  GOAL:   Patient will meet greater than or equal to 90% of their needs  MONITOR:   TF tolerance, Labs, Weight trends, Vent status  REASON FOR ASSESSMENT:   Ventilator, Consult Enteral/tube feeding initiation and management  ASSESSMENT:   52 yo male admitted on 8/23 with NSTEMI. On 8/26 pt had cath showing multivessel disease and subsequently had PCI to LAD on 9/3. Pt had TAVR scheduled for 9/10 but on evening on 9/9 pt with PEA arrest and pulmonary edema requiring intubation and transfer to ICU. PMH includes NSTEMI, HTN, CAD, CHF EF 15-20%, HIV on HAART, ESRD on HD   Started on CRRT Patient is currently intubated on ventilator support, fentanyl for sedation, levophed and dopamine for pressor support MV: 14 L/min Temp (24hrs), Avg:96 F (35.6 C), Min:94.8 F (34.9 C), Max:97.5 F (36.4 C)  Mother and brother-in-law at bedside. Pt unable to provide diet and weight history at this time Recorded po intake 75-100% of meals prior to intubation  Outpatient EDW 66 kg; current wt 71.7 kg. +edema, net +fluid status since admission. Plan to utilize EDW for estimating needs at present  Pt started on D10 infusion for hypoglycemia  Pt with orders for Renvela with meals; currently NPO, starting TF, on CRRT with phosphorus 3.5. Recommend holding/discontinuing Renvela while on CRRT. Noted nursing has not been administering  Labs: reviewed; phosphorus 3.5 (wdl), potassium 4.7 (wdl), corrected calcium 8.9, albumin 2.8 Meds: renvela  NUTRITION - FOCUSED PHYSICAL EXAM:    Most Recent Value  Orbital Region  No  depletion  Upper Arm Region  No depletion  Thoracic and Lumbar Region  Unable to assess  Buccal Region  Unable to assess  Temple Region  No depletion  Clavicle Bone Region  No depletion  Clavicle and Acromion Bone Region  No depletion  Scapular Bone Region  No depletion  Dorsal Hand  Unable to assess  Patellar Region  Mild depletion  Anterior Thigh Region  Mild depletion  Posterior Calf Region  Mild depletion  Edema (RD Assessment)  Moderate       Diet Order:   Diet Order            Diet NPO time specified  Diet effective now              EDUCATION NEEDS:   Not appropriate for education at this time  Skin:  Skin Assessment: Reviewed RN Assessment(no pressure ulcers/wounds noted)  Last BM:  9/8  Height:   Ht Readings from Last 1 Encounters:  05/27/18 5\' 11"  (1.803 m)    Weight:   Wt Readings from Last 1 Encounters:  06/16/2018 71.7 kg    Ideal Body Weight:     BMI:  Body mass index is 22.05 kg/m.  Estimated Nutritional Needs:   Kcal:  1784 kcals   Protein:  99-132 g  Fluid:  1000 mL plus UOP (no restriction while on CRRT)   Romelle Starcher MS, RD, LDN, CNSC [phone] Pager  [phone] Weekend/On-Call Pager

## 2018-06-08 NOTE — Progress Notes (Signed)
Social Note:  We appreciate the great care of the primary team. Family medicine is following along.  Peggyann Shoals, DO [facility], PGY-1 06/10/2018 12:16 PM

## 2018-06-09 ENCOUNTER — Inpatient Hospital Stay (HOSPITAL_COMMUNITY): Payer: Medicare Other

## 2018-06-09 DIAGNOSIS — Z7189 Other specified counseling: Secondary | ICD-10-CM

## 2018-06-09 LAB — POCT ACTIVATED CLOTTING TIME
ACTIVATED CLOTTING TIME: 175 s
ACTIVATED CLOTTING TIME: 180 s
ACTIVATED CLOTTING TIME: 180 s
ACTIVATED CLOTTING TIME: 197 s
ACTIVATED CLOTTING TIME: 208 s
ACTIVATED CLOTTING TIME: 230 s
ACTIVATED CLOTTING TIME: 235 s
Activated Clotting Time: 213 seconds
Activated Clotting Time: 208 seconds
Activated Clotting Time: 175 seconds
Activated Clotting Time: 180 seconds
Activated Clotting Time: 224 seconds
Activated Clotting Time: 213 seconds
Activated Clotting Time: 191 seconds

## 2018-06-09 LAB — BLOOD GAS, ARTERIAL
ACID-BASE DEFICIT: 2.5 mmol/L — AB (ref 0.0–2.0)
Bicarbonate: 21.2 mmol/L (ref 20.0–28.0)
FIO2: 50
MECHVT: 550 mL
O2 SAT: 99.6 %
PATIENT TEMPERATURE: 98.6
PCO2 ART: 32.5 mmHg (ref 32.0–48.0)
PEEP/CPAP: 8 cmH2O
PH ART: 7.43 (ref 7.350–7.450)
PO2 ART: 213 mmHg — AB (ref 83.0–108.0)
RATE: 26 resp/min

## 2018-06-09 LAB — CBC
HEMATOCRIT: 32.1 % — AB (ref 39.0–52.0)
HEMOGLOBIN: 10.1 g/dL — AB (ref 13.0–17.0)
MCH: 34.6 pg — AB (ref 26.0–34.0)
MCHC: 31.5 g/dL (ref 30.0–36.0)
MCV: 109.9 fL — AB (ref 78.0–100.0)
Platelets: 52 10*3/uL — ABNORMAL LOW (ref 150–400)
RBC: 2.92 MIL/uL — ABNORMAL LOW (ref 4.22–5.81)
RDW: 18.4 % — ABNORMAL HIGH (ref 11.5–15.5)
WBC: 12.1 10*3/uL — ABNORMAL HIGH (ref 4.0–10.5)

## 2018-06-09 LAB — RENAL FUNCTION PANEL
ALBUMIN: 2.3 g/dL — AB (ref 3.5–5.0)
ANION GAP: 8 (ref 5–15)
Albumin: 2.2 g/dL — ABNORMAL LOW (ref 3.5–5.0)
Anion gap: 8 (ref 5–15)
BUN: 14 mg/dL (ref 6–20)
BUN: 15 mg/dL (ref 6–20)
CHLORIDE: 103 mmol/L (ref 98–111)
CO2: 23 mmol/L (ref 22–32)
CO2: 23 mmol/L (ref 22–32)
Calcium: 7.9 mg/dL — ABNORMAL LOW (ref 8.9–10.3)
Calcium: 8 mg/dL — ABNORMAL LOW (ref 8.9–10.3)
Chloride: 105 mmol/L (ref 98–111)
Creatinine, Ser: 2.03 mg/dL — ABNORMAL HIGH (ref 0.61–1.24)
Creatinine, Ser: 1.75 mg/dL — ABNORMAL HIGH (ref 0.61–1.24)
GFR calc Af Amer: 42 mL/min — ABNORMAL LOW (ref >60)
GFR calc Af Amer: 51 mL/min — ABNORMAL LOW (ref >60)
GFR calc non Af Amer: 37 mL/min — ABNORMAL LOW (ref >60)
GFR calc non Af Amer: 44 mL/min — ABNORMAL LOW (ref >60)
GLUCOSE: 73 mg/dL (ref 70–99)
GLUCOSE: 115 mg/dL — AB (ref 70–99)
PHOSPHORUS: 2.3 mg/dL — AB (ref 2.5–4.6)
POTASSIUM: 4.9 mmol/L (ref 3.5–5.1)
Phosphorus: 3.3 mg/dL (ref 2.5–4.6)
Potassium: 4.7 mmol/L (ref 3.5–5.1)
Sodium: 136 mmol/L (ref 135–145)
Sodium: 134 mmol/L — ABNORMAL LOW (ref 135–145)

## 2018-06-09 LAB — GLUCOSE, CAPILLARY
GLUCOSE-CAPILLARY: 106 mg/dL — AB (ref 70–99)
Glucose-Capillary: 77 mg/dL (ref 70–99)
Glucose-Capillary: 81 mg/dL (ref 70–99)
Glucose-Capillary: 74 mg/dL (ref 70–99)
Glucose-Capillary: 120 mg/dL — ABNORMAL HIGH (ref 70–99)
Glucose-Capillary: 93 mg/dL (ref 70–99)

## 2018-06-09 LAB — MAGNESIUM
Magnesium: 2.3 mg/dL (ref 1.7–2.4)
Magnesium: 2.3 mg/dL (ref 1.7–2.4)

## 2018-06-09 LAB — APTT: aPTT: 84 seconds — ABNORMAL HIGH (ref 24–36)

## 2018-06-09 LAB — HIV-1 RNA QUANT-NO REFLEX-BLD
HIV 1 RNA Quant: <20 copies/mL
LOG10 HIV-1 RNA: UNDETERMINED log10copy/mL

## 2018-06-09 LAB — T-HELPER CELLS (CD4) COUNT (NOT AT ARMC)
CD4 % Helper T Cell: 25 % — ABNORMAL LOW (ref 33–55)
CD4 T Cell Abs: 340 /uL — ABNORMAL LOW (ref 400–2700)

## 2018-06-09 LAB — HEPARIN LEVEL (UNFRACTIONATED): HEPARIN UNFRACTIONATED: 0.13 [IU]/mL — AB (ref 0.30–0.70)

## 2018-06-09 LAB — CORTISOL: Cortisol, Plasma: 11.2 ug/dL

## 2018-06-09 LAB — PHOSPHORUS: Phosphorus: 3.1 mg/dL (ref 2.5–4.6)

## 2018-06-09 MED ORDER — DARUNAVIR-COBICISTAT 800-150 MG PO TABS
1.0000 | ORAL_TABLET | Freq: Every day | ORAL | Status: DC
  Administered 2018-06-09 – 2018-06-16 (×8): 1 via ORAL
  Filled 2018-06-09 (×10): qty 1

## 2018-06-09 MED ORDER — VASOPRESSIN 20 UNIT/ML IV SOLN
0.0300 [IU]/min | INTRAVENOUS | Status: DC
  Administered 2018-06-09 – 2018-06-11 (×3): 0.03 [IU]/min via INTRAVENOUS
  Filled 2018-06-09 (×4): qty 2

## 2018-06-09 MED ORDER — DARBEPOETIN ALFA 150 MCG/0.3ML IJ SOSY
150.0000 ug | PREFILLED_SYRINGE | INTRAMUSCULAR | Status: DC
  Filled 2018-06-09: qty 0.3

## 2018-06-09 MED ORDER — WHITE PETROLATUM EX OINT
TOPICAL_OINTMENT | CUTANEOUS | Status: AC
  Administered 2018-06-09: 18:00:00
  Filled 2018-06-09: qty 28.35

## 2018-06-09 MED ORDER — SEVELAMER CARBONATE 0.8 G PO PACK
1.6000 g | PACK | Freq: Three times a day (TID) | ORAL | Status: DC
  Administered 2018-06-09 – 2018-06-10 (×4): 1.6 g
  Filled 2018-06-09 (×5): qty 2

## 2018-06-09 MED ORDER — HYDROCORTISONE NA SUCCINATE PF 100 MG IJ SOLR
50.0000 mg | Freq: Four times a day (QID) | INTRAMUSCULAR | Status: DC
  Administered 2018-06-09 – 2018-06-17 (×32): 50 mg via INTRAVENOUS
  Filled 2018-06-09 (×31): qty 2

## 2018-06-09 MED ORDER — SODIUM PHOSPHATES 45 MMOLE/15ML IV SOLN
30.0000 mmol | Freq: Once | INTRAVENOUS | Status: AC
  Administered 2018-06-09: 30 mmol via INTRAVENOUS
  Filled 2018-06-09: qty 10

## 2018-06-09 NOTE — Progress Notes (Signed)
Social Note:  We appreciate the great care of the primary team. Family medicine is following along.  Peggyann Shoals, DO [facility], PGY-1 06/09/2018 8:25 AM

## 2018-06-09 NOTE — Progress Notes (Signed)
[name]  ZOX:[id number] DOB: [birth year] DOA: 05/20/2018 PCP: Patient, No Pcp Per    LOS: 19 days   Reason for Consult / Chief Complaint:  Cardiac Arrest  Consulting MD and date:  [name] 06/19/2018  HPI/Summary of hospital stay:  Pt is encephelopathic; therefore, this HPI is obtained from chart review. [name] is a 52 y.o. male with PMH of NSTEMI, HTN, CAD, sCHF (echo from Aug 2019 with EF 15-20%), HIV on HAART, ESRD on HD TThS.  He was initially admitted 8/23 with NSTEMI.  On 8/26, had cath that showed multivessel disease; therefore, on 9/3, had PCI to [name] and was also being worked up for a TAVR that he was scheduled to receive 05/31/2018. On evening of 9/9, he was noted to have PEA arrest that lasted roughly 2 minutes before ROSC.  EKG concerning for 3rd degree heart block.  Had recurrent arrest later that night and was started on transcutaneous pacing.  He remained conscious following both episodes.  Early AM 9/9, PCCM called for assistance with medical management. Required intubation for pulmonary edema. Went for temp Temp venous Pacemaker on 9/9, returned to ICU CRRT started. Requiring high PEEP/FIO2 as well as high dose vasopressors for cardiogenic shock.  9/10: still positive volume status. WBCs remain up.  FiO2 and PEEP are improving.  He did vagal down and require escalation of pressors when we attempted to wean sedation.,  Once he was relaxed we are able to resume weaning pressors  Subjective:  Hypotension overnight requiring high dose pressors.  Objective   Blood pressure 97/62, pulse 74, temperature 97.7 F (36.5 C), resp. rate (!) 26, height 5\' 11"  (1.803 m), weight 69.9 kg, SpO2 100 %.    Vent Mode: PRVC FiO2 (%):  [50 %] 50 % Set Rate:  [26 bmp] 26 bmp Vt Set:  [550 mL] 550 mL PEEP:  [8 cmH20] 8 cmH20 Delta P (Amplitude):  [20] 20 Pressure Support:  [8 cmH20] 8 cmH20 Plateau Pressure:  [26 cmH20-29 cmH20] 26 cmH20   Intake/Output Summary (Last 24  hours) at 06/09/2018 1208 Last data filed at 06/09/2018 1200 Gross per 24 hour  Intake 4083.93 ml  Output 4547 ml  Net -463.07 ml   Filed Weights   06/28/2018 1305 06/12/2018 0600 06/09/18 0400  Weight: 68.9 kg 71.7 kg 69.9 kg    Examination: General: Chronically ill appearing male, NAD HEENT: [place]/AT, PERRL, EOM-I and MMM Pulmonary: Coarse BS diffusely Cardiac: RRR, Nl S1/S2 and -M/R/G Abd: Soft, NT, ND and +BS Extremities: -edema and -tenderness Neuro: Arousable and following commands GU: An uric  Consults: date of consult/date signed off & final recs:  Cardiology 8/23 >  Nephrology 8/24 >  CVTS 8/27 >   Procedures: [facility] 8/26 > 1st  diag lesion 100% stenosed, prox [name] to mid [name] 95% stenosed, 2nd diag lesion 99% stenosed, 1st Mrg lesion 90% stenosed, prox RCA to mid RCA 20% stenosed, mid RCA lesion 20% stenosed.  Significant Diagnostic Tests: CTA chest 8/30 > enlarged PA, cardiomegaly, CAD.  Paraseptal and centrilobular emphysema, trace right pleural effusion, mild pulmonary edema, Enlarged [name] in mediastinum and retroperitoneal nodal stations, possibly low grade developing lymphoproliferative process. Carotid duplex 8/31 > no acute process. CXR 9/9 > pulmonary edema.  Micro Data: Blood 9/9 Sputum 9/10>>>  Antimicrobials:  HAART Vanc 9/10>>> Cefepime 9/10>>>  Resolved Hospital Problem list    Assessment & Plan:   Cardiac arrest - PEA w/ CHB:  Etiology not entirely clear, but  he does have known CAD and is s/p PCI a few days ago. ESRD and significant aortic stenosis.  -? Metabolic vs Pulmonary edema as etiology. Or at least complicating factors.  -S/p transvenous maker 9/9. Has not required pacing since last evening on 9/9 Plan Tele monitoring W/ ESRD PPM may be challenging.  Hopefully there is a component of cardiac stenting and perhaps will only be needed short-term  Cardiogenic shock: Known HFrEF with LVEF 25-30%.  -pressor requirements: Had been improving until he  had a vagal episode when sedation was weaned Plan We will place Flowtrack monitoring device to help optimize hemodynamics D/C dopamine Add vasopressin Stress dose steroids Continue volume removal guided by hemodynamics via CRRT  NSTEMI: was admitted for this and is s/p PCI and arthrectomy from the mid [name] Plan Continuing antiplatelet therapy with aspirin and Plavix Tele monitoring Cardiology following  Aortic stenosis -Was for TAVR 9/10 Plan To be determined.  Currently critically ill and not a candidate at this point this will need to be reevaluated  Acute hypoxemic respiratory failure s/p cardiac arrest: likely due to pulmonary edema -gas exchange improved.  -FIO2/PEEP requirements:  PCXR personally reviewed:  Plan Full vent support Adjust vent for ABG CRRT volume even Hold off weaning for now Will likely need a tracheostomy as wife wishes for everything to be done  ESRD on HD:  Missed HD on 9/7. -Started on CRRT on 9/9 Plan Continuing CRRT as guided by nephrology Fluid even due to hypotension   Intermittent fluid and electrolyte imbalance: Plan Trend chemistries  Leukocytosis  -likely reactive but could be infective.  -I suspect hypothermia is d/t CRRT Plan F/u BCs Ck sputum  Cont to trend  Mild anemia Plan Trend cbc   HIV; there is report of non-compliance w/ ARTs but last viral load was undetectable  Plan Continue ART medications via tube Repeat T-helper and HIV 1 RNA  COPD Plan Continuing duo nebs  Hypoglycemia Plan Adding tube feeds Trend cbgs   Disposition / Summary of Today's Plan 06/09/18   Full support, pressors adjusted  Best Practice / Goals of Care / Disposition.   DVT prophylaxis: enoxaparin GI prophylaxis: pepcid Diet: NPO, start tube feeds 9/10 Mobility:BR Code Status: Full Family Communication: wife updated bedside  Labs   CBC: Recent Labs  Lab 06/03/18 0359 06/04/18 0555 06/25/2018 0750 06/16/2018 1210 06/27/2018 0347  06/09/18 0414  WBC 6.1 8.0  --  19.3* 20.3* 12.1*  NEUTROABS  --  5.5  --  15.4*  --   --   HGB 10.9* 11.0* 10.9* 11.5* 11.7* 10.1*  HCT 34.1* 34.5* 32.0* 35.6* 36.6* 32.1*  MCV 107.6* 108.2*  --  106.0* 108.0* 109.9*  PLT 112* 105*  --  129* 98* 52*   Basic Metabolic Panel: Recent Labs  Lab 06/09/2018 1210 06/10/2018 1458 06/24/2018 0347 06/09/2018 1253 06/25/2018 1752 06/09/18 0414  NA 140 139 135  --  135 136  K 4.2 4.3 4.7  --  5.0 4.7  CL 97* 100 100  --  103 105  CO2 22 22 22   --  22 23  GLUCOSE 208* 58* 81  --  75 73  BUN 83* 67* 34*  --  20 14  CREATININE 9.27* 7.59* 4.54*  --  2.90* 2.03*  CALCIUM 8.4* 8.2* 7.9*  --  8.0* 7.9*  MG 2.5*  --  2.3 2.3 2.3 2.3  PHOS 6.9* 5.4* 3.5 3.1 2.8 2.3*   GFR: Estimated Creatinine Clearance: 43 mL/min (A) (by  C-G formula based on SCr of 2.03 mg/dL (H)). Recent Labs  Lab 06/04/18 0555  06/09/2018 0529 06/14/2018 0759 06/23/2018 1210 06/03/2018 1458 06/16/2018 0347 06/09/18 0414  WBC 8.0  --   --   --  19.3*  --  20.3* 12.1*  LATICACIDVEN  --    < > 4.3* 7.2* 3.7* 2.5*  --   --    < > = values in this interval not displayed.   Liver Function Tests: Recent Labs  Lab 06/06/2018 1210 06/06/2018 1458 06/19/2018 0347 06/04/2018 1752 06/09/18 0414  ALBUMIN 3.0* 2.9* 2.8* 2.4* 2.3*   No results for input(s): LIPASE, AMYLASE in the last 168 hours. No results for input(s): AMMONIA in the last 168 hours. ABG    Component Value Date/Time   PHART 7.430 06/09/2018 0415   PCO2ART 32.5 06/09/2018 0415   PO2ART 213 (H) 06/09/2018 0415   HCO3 21.2 06/09/2018 0415   TCO2 27 06/23/2018 0842   ACIDBASEDEF 2.5 (H) 06/09/2018 0415   O2SAT 99.6 06/09/2018 0415    Coagulation Profile: No results for input(s): INR, PROTIME in the last 168 hours. Cardiac Enzymes: Recent Labs  Lab 06/06/2018 1210 06/03/2018 1805  TROPONINI 8.48* 7.29*   HbA1C: Hgb A1c MFr Bld  Date/Time Value Ref Range Status  05/05/2018 07:07 PM 4.5 (L) 4.8 - 5.6 % Final    Comment:     (NOTE) Pre diabetes:          5.7%-6.4% Diabetes:              >6.4% Glycemic control for   <7.0% adults with diabetes    CBG: Recent Labs  Lab 06/26/2018 1547 06/19/2018 1935 06/09/18 0000 06/09/18 0412 06/09/18 0736  GLUCAP 78 72 81 77 74    The patient is critically ill with multiple organ systems failure and requires high complexity decision making for assessment and support, frequent evaluation and titration of therapies, application of advanced monitoring technologies and extensive interpretation of multiple databases.   Critical Care Time devoted to patient care services described in this note is  34  Minutes. This time reflects time of care of this signee Dr Koren Bound. This critical care time does not reflect procedure time, or teaching time or supervisory time of PA/NP/Med student/Med Resident etc but could involve care discussion time.  Alyson Reedy, M.D. [facility] Pulmonary/Critical Care Medicine. Pager: [phone]. After hours pager: [phone].

## 2018-06-09 NOTE — Progress Notes (Signed)
Subjective: Interval History: still on high dose pressors.  Vent support.     Objective: Vital signs in last 24 hours: Temp:  [95 F (35 C)-98.4 F (36.9 C)] 98.4 F (36.9 C) (09/11 0700) Pulse Rate:  [69-116] 77 (09/11 0700) Resp:  [19-28] 26 (09/11 0700) BP: (78-117)/(40-80) 93/62 (09/11 0700) SpO2:  [100 %] 100 % (09/11 0700) FiO2 (%):  [50 %-80 %] 50 % (09/11 0400) Weight:  [69.9 kg] 69.9 kg (09/11 0400) Weight change: 1 kg  Intake/Output from previous day: 09/10 0701 - 09/11 0700 In: 4173.2 [I.V.:2610.2; NG/GT:1063; IV Piggyback:500] Out: 4827  Intake/Output this shift: No intake/output data recorded.  General appearance: opens eyes, on vent,   Resp: rales bibasilar and rhonchi bilaterally Cardio: S1, S2 normal and systolic murmur: systolic ejection 2/6, crescendo and decrescendo at 2nd left intercostal space GI: liver down 5 cm, pos bs soft Extremities: fem cath on L, art line on R  Lab Results: Recent Labs    05/31/2018 0347 06/09/18 0414  WBC 20.3* 12.1*  HGB 11.7* 10.1*  HCT 36.6* 32.1*  PLT 98* 52*   BMET:  Recent Labs    06/27/2018 1752 06/09/18 0414  NA 135 136  K 5.0 4.7  CL 103 105  CO2 22 23  GLUCOSE 75 73  BUN 20 14  CREATININE 2.90* 2.03*  CALCIUM 8.0* 7.9*   No results for input(s): PTH in the last 72 hours. Iron Studies: No results for input(s): IRON, TIBC, TRANSFERRIN, FERRITIN in the last 72 hours.  Studies/Results: Dg Chest Port 1 View  Result Date: 06/20/2018 CLINICAL DATA:  Shortness of breath.  Pacemaker complications. EXAM: PORTABLE CHEST 1 VIEW COMPARISON:  06/12/2018 . FINDINGS: The endotracheal tube tip is well positioned above the carina. There is an enteric tube with tip in the stomach. Transvenous pacer device is identified with lead in the projection of the right ventricle. Mild cardiac enlargement. Similar appearance of diffuse pulmonary edema. Decrease in right pleural effusion. IMPRESSION: 1. Stable position of transvenous  pacer device. 2. Persistent bilateral pulmonary edema pattern. 3. Decrease in right pleural effusion. Electronically Signed   By: Signa Kell M.D.   On: 06/02/2018 19:58   Dg Chest Port 1 View  Result Date: 06/26/2018 CLINICAL DATA:  Evaluate pacemaker leads. EXAM: PORTABLE CHEST 1 VIEW COMPARISON:  Portable chest x-ray of June 07, 2018 at 7:40 a.m. FINDINGS: A. Transvenous pacemaker electrode is present with the tip projecting in the region of the right ventricular apex. The electrode has been passed via the femoral approach. The lungs are adequately inflated. The interstitial markings remain increased. The right hemidiaphragm is obscured. The cardiac silhouette is enlarged and the pulmonary vascularity engorged and indistinct. The endotracheal tube tip projects approximately 3.6 cm above the carina. The esophagogastric tube tip and proximal port project below the GE junction. IMPRESSION: Reasonable positioning of the transvenous pacemaker electrode. External pacemaker defibrillator pads remain present. CHF with pulmonary interstitial and early alveolar edema. Probable small right pleural effusion. The endotracheal and esophagogastric tubes are in reasonable position radiographically. Electronically Signed   By: David  [place] M.D.   On: 06/26/2018 12:24   Dg Chest Port 1 View  Result Date: 06/09/2018 CLINICAL DATA:  Acute respiratory failure with hypoxia EXAM: PORTABLE CHEST 1 VIEW COMPARISON:  Portable chest x-ray of June 07, 2018 at 5:12 a.m. FINDINGS: There has been interval intubation of the trachea and esophagus. The endotracheal tube tip projects approximately 6.1 cm above the carina. The esophagogastric tube tip and  proximal port project below the GE junction. The lungs are well-expanded. Confluent interstitial and airspace opacities are present bilaterally and more conspicuous. The cardiac silhouette is enlarged. The pulmonary vascularity is engorged and indistinct. There is calcification  in the wall of the aortic arch. The observed bony thorax is unremarkable. There are radiodense vascular grafts in the left subclavian and axillary regions. IMPRESSION: CHF with interstitial and alveolar edema. Recent intubation of the trachea and esophagus with reasonable positioning of the support tubes. Thoracic aortic atherosclerosis. Electronically Signed   By: David  [place] M.D.   On: 06/28/2018 08:17    I have reviewed the patient's current medications.  Assessment/Plan: 1 ESRD vol improving.  Will keep even.  Good solute/acid/base/K.  Crrt going well.  Needs P repletion 2 Schock  ??cardiac.  On AB also 3 HIV 4 CAD per cards 5 AS 6 Anemia add esa 7 HPTH meds 8 Heart block P CRRT, even, vent, see if can wean pressors.      LOS: 19 days   [name] 06/09/2018,7:33 AM

## 2018-06-09 NOTE — Progress Notes (Signed)
Progress Note  Patient Name: [name] Date of Encounter: 06/09/2018  Primary Cardiologist: Sanda Klein, MD   Subjective   Patient is currently intubated and sedated, wife at bedside.  We have had issues with intermittent hypotension limiting ability to pull off fluid with CVVH. He is still on high dose levophed, and the dopamine has gone up and down (currently weaning). He has had brief periods of pacing but predominantly has had native rhythm.  Had long discussion with wife at bedside (see below).  Inpatient Medications    Scheduled Meds: . angioplasty book   Does not apply Once  . aspirin  81 mg Oral Daily  . B-complex with vitamin C  1 tablet Per Tube Daily  . chlorhexidine gluconate (MEDLINE KIT)  15 mL Mouth Rinse BID  . Chlorhexidine Gluconate Cloth  6 each Topical Q0600  . clopidogrel  75 mg Oral Daily  . darbepoetin (ARANESP) injection - DIALYSIS  150 mcg Intravenous Q Wed-HD  . darunavir  800 mg Oral Q supper  . dolutegravir  50 mg Oral Q supper  . gabapentin  100 mg Oral Daily  . heparin  2.8 mL Intracatheter Once  . heparin injection (subcutaneous)  5,000 Units Subcutaneous Q8H  . insulin aspart  1-3 Units Subcutaneous Q4H  . ipratropium-albuterol  3 mL Nebulization Q6H  . mouth rinse  15 mL Mouth Rinse 10 times per day  . ritonavir  100 mg Oral Q supper  . rosuvastatin  10 mg Oral q1800  . sevelamer carbonate  1,600 mg Oral TID WC  . sodium chloride flush  3 mL Intravenous Q12H  . tenofovir  300 mg Oral Weekly   Continuous Infusions: . sodium chloride Stopped (06/09/18 0835)  . ceFEPime (MAXIPIME) IV Stopped (06/09/18 0456)  . dextrose Stopped (06/19/2018 1448)  . DOPamine 2 mcg/kg/min (06/09/18 0835)  . famotidine (PEPCID) IV Stopped (06/13/2018 2229)  . feeding supplement (VITAL AF 1.2 CAL) 60 mL/hr at 06/04/2018 2000  . fentaNYL infusion INTRAVENOUS 250 mcg/hr (06/09/18 0835)  . ferric gluconate (FERRLECIT/NULECIT) IV Stopped (06/05/18 1531)    . heparin 10,000 units/ 20 mL infusion syringe 800 Units/hr (06/09/18 0746)  . norepinephrine (LEVOPHED) Adult infusion 64 mcg/min (06/09/18 0835)  . dialysis replacement fluid (prismasate) 800 mL/hr at 06/09/18 0440  . dialysis replacement fluid (prismasate) 700 mL/hr at 06/09/18 0310  . dialysate (PRISMASATE) 2,000 mL/hr at 06/09/18 0740  . sodium chloride    . sodium phosphate  Dextrose 5% IVPB 30 mmol (06/09/18 0835)  . vancomycin     PRN Meds: sodium chloride, acetaminophen, alteplase, artificial tears, fentaNYL, heparin, heparin, midazolam, midazolam, nitroGLYCERIN, ondansetron (ZOFRAN) IV, polyvinyl alcohol, sodium chloride, sodium chloride flush   Vital Signs    Vitals:   06/09/18 0700 06/09/18 0800 06/09/18 0815 06/09/18 0816  BP: 93/62 98/61 98/61    Pulse: 77 77 78   Resp: (!) 26 (!) 26 (!) 25   Temp: 98.4 F (36.9 C) 98.6 F (37 C) 98.6 F (37 C)   TempSrc:      SpO2: 100% 100% 100% 100%  Weight:      Height:        Intake/Output Summary (Last 24 hours) at 06/09/2018 0904 Last data filed at 06/09/2018 0835 Gross per 24 hour  Intake 4035.38 ml  Output 4386 ml  Net -350.62 ml   Filed Weights   05/30/2018 1305 05/31/2018 0600 06/09/18 0400  Weight: 68.9 kg 71.7 kg 69.9 kg   Vent Mode: PRVC FiO2 (%):  [  50 %] 50 % Set Rate:  [26 bmp] 26 bmp Vt Set:  [550 mL] 550 mL PEEP:  [8 cmH20] 8 cmH20 Delta P (Amplitude):  [20] 20 Pressure Support:  [8 cmH20] 8 cmH20 Plateau Pressure:  [26 cmH20-29 cmH20] 26 cmH20  Telemetry    Personally reviewed: as above, predominantly in his own sinus rhythm with only intermittent pacer use  Physical Exam   GEN: intubated and sedated Neck: supple Cardiac: RRR, 3/6 systolic murmur Respiratory: mechanical vent sounds, no wheezing GI: Soft, nontender, non-distended  MS: LUE fistula. Blister on L dorsal foot Neuro:  sedated but moving arms and legs intermittently, occasional eye opening but not to command Psych: sedated  Labs     Chemistry Recent Labs  Lab 06/28/2018 0347 06/27/2018 1752 06/09/18 0414  NA 135 135 136  K 4.7 5.0 4.7  CL 100 103 105  CO2 22 22 23   GLUCOSE 81 75 73  BUN 34* 20 14  CREATININE 4.54* 2.90* 2.03*  CALCIUM 7.9* 8.0* 7.9*  ALBUMIN 2.8* 2.4* 2.3*  GFRNONAA 14* 24* 37*  GFRAA 16* 28* 42*  ANIONGAP 13 10 8      Hematology Recent Labs  Lab 06/25/2018 1210 06/24/2018 0347 06/09/18 0414  WBC 19.3* 20.3* 12.1*  RBC 3.36* 3.39* 2.92*  HGB 11.5* 11.7* 10.1*  HCT 35.6* 36.6* 32.1*  MCV 106.0* 108.0* 109.9*  MCH 34.2* 34.5* 34.6*  MCHC 32.3 32.0 31.5  RDW 18.5* 18.6* 18.4*  PLT 129* 98* 52*   PCI 06/12/2018  Prox LAD lesion is 99% stenosed.  Post intervention, there is a 0% residual stenosis.  A drug-eluting stent was successfully placed.   1. Severe, heavily calcified stenosis in the mid LAD 2. Successful PTCA of the mid LAD with orbital atherectomy and stenting of the mid LAD.   Recommendations: Will continue DAPT with ASA and Plavix. Will continue planning for TAVR.  Recommend uninterrupted dual antiplatelet therapy with Aspirin 64m daily and Clopidogrel 767mdaily for a minimum of 12 months (ACS - Class I recommendation).  Patient Profile     [age].o. 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  Assessment & Plan    #cardiac arrest: initially appeared to be complete heart block, with subsequent arrests appearing consistent with PEA.  -now s/p transvenous wire, predominantly in his own sinus rhythm with only occasional use of TV pacer -recovery will determine planning for permanent system. History of ESRD/fistulas makes permanent transvenous pacing complex, but high pacing burden makes  leadless system not ideal. Also has low EF, now s/p PCI and potentially TAVR in the future. Question whether he would be a candidate for ICD as well. -his wife and I had a long discussion about code status, possibly outcomes, and overall plan. She would like him to continue to be full code. She understands that the outcome ultimately "is between him (the patient) and God" but wants to continue to be aggressive. She does have an understanding of how many organ systems are currently affected, but she remains optimistic. Has lost multiple family members in recent years and knows that things can change suddenly.  # coronary artery disease -S/P PCI to mLAD with orbital atherectomy on 06/28/2018 -continue DAPT with aspirin and clopidogrel -VT/VF not cause of arrest, no shocks required -  troponin will be elevated due to multiple rounds of chest compressions  # aortic stenosis/aortic insufficiency -TAVR plans on hold given cardiac arrests  # end-stage renal disease -dialysis per nephrology.   # cardiomyopathy -therapy on hold while on pressors. Weaning dopamine, still on levophed.  # HIV-per medicine  For questions or updates, please contact [facility] Please consult [email or web address] for contact info under Cardiology/STEMI.  CRITICAL CARE Patient is critically ill with multiple organ systems affected and requires high complexity decision making. Total critical care time: 35 minutes. This time includes gathering of history, evaluation of patient's response to treatment, examination of patient, review of laboratory and imaging studies, and coordination with consultants.      Patient remains critically ill, with recovery potential unclear.   Signed, Buford Dresser, MD  06/09/2018, 9:04 AM

## 2018-06-10 ENCOUNTER — Inpatient Hospital Stay (HOSPITAL_COMMUNITY): Payer: Medicare Other

## 2018-06-10 LAB — RENAL FUNCTION PANEL
ALBUMIN: 2.3 g/dL — AB (ref 3.5–5.0)
ANION GAP: 10 (ref 5–15)
ANION GAP: 10 (ref 5–15)
Albumin: 2.4 g/dL — ABNORMAL LOW (ref 3.5–5.0)
BUN: 15 mg/dL (ref 6–20)
BUN: 16 mg/dL (ref 6–20)
CALCIUM: 8.3 mg/dL — AB (ref 8.9–10.3)
CALCIUM: 8.7 mg/dL — AB (ref 8.9–10.3)
CO2: 21 mmol/L — ABNORMAL LOW (ref 22–32)
CO2: 23 mmol/L (ref 22–32)
Chloride: 103 mmol/L (ref 98–111)
Chloride: 104 mmol/L (ref 98–111)
Creatinine, Ser: 1.51 mg/dL — ABNORMAL HIGH (ref 0.61–1.24)
Creatinine, Ser: 1.38 mg/dL — ABNORMAL HIGH (ref 0.61–1.24)
GFR calc Af Amer: >60 mL/min (ref >60)
GFR calc non Af Amer: 58 mL/min — ABNORMAL LOW (ref >60)
GFR, EST NON AFRICAN AMERICAN: 52 mL/min — AB (ref >60)
Glucose, Bld: 183 mg/dL — ABNORMAL HIGH (ref 70–99)
Glucose, Bld: 147 mg/dL — ABNORMAL HIGH (ref 70–99)
PHOSPHORUS: 2.6 mg/dL (ref 2.5–4.6)
PHOSPHORUS: 2 mg/dL — AB (ref 2.5–4.6)
POTASSIUM: 4.8 mmol/L (ref 3.5–5.1)
Potassium: 4.7 mmol/L (ref 3.5–5.1)
SODIUM: 134 mmol/L — AB (ref 135–145)
SODIUM: 137 mmol/L (ref 135–145)

## 2018-06-10 LAB — POCT I-STAT 3, ART BLOOD GAS (G3+)
ACID-BASE EXCESS: 3 mmol/L — AB (ref 0.0–2.0)
Bicarbonate: 25.9 mmol/L (ref 20.0–28.0)
O2 SAT: 100 %
PCO2 ART: 32.9 mmHg (ref 32.0–48.0)
PO2 ART: 244 mmHg — AB (ref 83.0–108.0)
Patient temperature: 98
TCO2: 27 mmol/L (ref 22–32)
pH, Arterial: 7.503 — ABNORMAL HIGH (ref 7.350–7.450)

## 2018-06-10 LAB — CBC
HCT: 29.5 % — ABNORMAL LOW (ref 39.0–52.0)
Hemoglobin: 9.2 g/dL — ABNORMAL LOW (ref 13.0–17.0)
MCH: 33.9 pg (ref 26.0–34.0)
MCHC: 31.2 g/dL (ref 30.0–36.0)
MCV: 108.9 fL — AB (ref 78.0–100.0)
PLATELETS: 36 10*3/uL — AB (ref 150–400)
RBC: 2.71 MIL/uL — ABNORMAL LOW (ref 4.22–5.81)
RDW: 18 % — AB (ref 11.5–15.5)
WBC: 16.5 10*3/uL — AB (ref 4.0–10.5)

## 2018-06-10 LAB — PHOSPHORUS: Phosphorus: 2.7 mg/dL (ref 2.5–4.6)

## 2018-06-10 LAB — CULTURE, RESPIRATORY W GRAM STAIN

## 2018-06-10 LAB — GLUCOSE, CAPILLARY
GLUCOSE-CAPILLARY: 177 mg/dL — AB (ref 70–99)
GLUCOSE-CAPILLARY: 131 mg/dL — AB (ref 70–99)
GLUCOSE-CAPILLARY: 122 mg/dL — AB (ref 70–99)
Glucose-Capillary: 105 mg/dL — ABNORMAL HIGH (ref 70–99)
Glucose-Capillary: 143 mg/dL — ABNORMAL HIGH (ref 70–99)
Glucose-Capillary: 144 mg/dL — ABNORMAL HIGH (ref 70–99)

## 2018-06-10 LAB — CULTURE, RESPIRATORY

## 2018-06-10 LAB — APTT: APTT: 52 s — AB (ref 24–36)

## 2018-06-10 LAB — MAGNESIUM: MAGNESIUM: 2.5 mg/dL — AB (ref 1.7–2.4)

## 2018-06-10 MED ORDER — GABAPENTIN 100 MG PO CAPS
ORAL_CAPSULE | ORAL | Status: AC
  Filled 2018-06-10: qty 1

## 2018-06-10 MED ORDER — INSULIN ASPART 100 UNIT/ML ~~LOC~~ SOLN
0.0000 [IU] | SUBCUTANEOUS | Status: DC
  Administered 2018-06-10 – 2018-06-11 (×4): 3 [IU] via SUBCUTANEOUS

## 2018-06-10 MED FILL — Medication: Qty: 1 | Status: AC

## 2018-06-10 NOTE — Progress Notes (Signed)
Social Note:   We appreciate the great care of the primary team. Family medicine is following along.   Peggyann ShoalsHannah Antoinette Borgwardt, DO [facility], PGY-1 06/10/2018 7:28 AM

## 2018-06-10 NOTE — Progress Notes (Addendum)
[name]  [id number] DOB: June 08, 1966 DOA: 05/07/2018 PCP: Patient, No Pcp Per    LOS: 20 days   Reason for Consult / Chief Complaint:  Cardiac Arrest  Consulting MD and date:  Gwendolyn GrantWalden 06/13/2018  HPI/Summary of hospital stay:  [name] is a 52 y.o. male with hx severe CAD, AS, sCHF (echo from Aug 2019 with EF 15-20%), HIV on HAART, ESRD on HD (for almost 20 years) TThS. Initially admitted 8/23 with NSTEMI.  On 8/26, had cath that showed multivessel disease; therefore, on 9/3, had PCI to LAD and was also being worked up for a TAVR that he was scheduled to receive 06/20/2018. On 9/9 had brief PEA arrest with 2mins CPR thought r/t 3rd degree block.  Had recurrent arrest later that night requiring transcutaneous pacing.  He remained conscious following both episodes but had progressive respiratory failure r/t pulmonary edema requiring intubation.   Went for temp Temp Pacemaker on 9/9, returned to ICU CRRT started. Requiring high PEEP/FIO2 as well as high dose vasopressors for cardiogenic shock.  9/10: still positive volume status. WBCs remain up.  FiO2 and PEEP are improving.   9/12 volume removal with CVVHD limited by hypotension/pressor needs.  Plts trending down-> 36  Subjective/Overnight:  Remains on CVVHD on 2 pressors CVP 19  Attempting -8875ml/hr via CVVHD   Objective   Blood pressure 101/68, pulse 62, temperature (!) 95.2 F (35.1 C), resp. rate (!) 26, height 5\' 11"  (1.803 m), weight 68.3 kg, SpO2 100 %. CVP:  [19 mmHg] 19 mmHg  Vent Mode: PRVC FiO2 (%):  [50 %] 50 % Set Rate:  [26 bmp] 26 bmp Vt Set:  [550 mL] 550 mL PEEP:  [8 cmH20] 8 cmH20 Plateau Pressure:  [26 cmH20-28 cmH20] 26 cmH20   Intake/Output Summary (Last 24 hours) at 06/10/2018 0906 Last data filed at 06/10/2018 0800 Gross per 24 hour  Intake 4141.77 ml  Output 3993 ml  Net 148.77 ml   Filed Weights   06/20/2018 0600 06/09/18 0400 06/10/18 0500  Weight: 71.7 kg 69.9 kg 68.3 kg     Examination: General: Chronically ill appearing male, NAD HEENT: [place]/AT, PERRL, EOM-I and MMM Pulmonary: resps even non labored on vent, coarse, few bibasilar crackles  Cardiac: rrr  Abd: soft, non tender  Extremities: no edema  Neuro: sedated, RASS-1, follows commands    Consults: date of consult/date signed off & final recs:  Cardiology 8/23 >  Nephrology 8/24 >  CVTS 8/27 >   [facility] > 1st  diag lesion 100% stenosed, prox LAD to mid LAD 95% stenosed, 2nd diag lesion 99% stenosed, 1st Mrg lesion 90% stenosed, prox RCA to mid RCA 20% stenosed, mid RCA lesion 20% stenosed. 9/9 intubated>>>  Significant Diagnostic Tests: CTA chest 8/30 > enlarged PA, cardiomegaly, CAD.  Paraseptal and centrilobular emphysema, trace right pleural effusion, mild pulmonary edema, Enlarged LAD in mediastinum and retroperitoneal nodal stations, possibly low grade developing lymphoproliferative process. Carotid duplex 8/31 > no acute process.   Micro Data: Blood 9/9 Sputum 9/10>>>  Antimicrobials:  HAART Vanc 9/10>>> Cefepime 9/10>>>  Resolved Hospital Problem list    Assessment & Plan:   Cardiac arrest - PEA w/ CHB - s/p transvenous pacemaker 9/9.  Significant AS - was for TAVR 9/10, now on hold  HFrEF  Plan Tele monitoring Cardiology following  Continue ASA, plavix  TAVR plans on hold  ?needs ICD as well    Cardiogenic shock: Known HFrEF with LVEF 25-30%.  Plan Continue levoped, vasopressin  Continue Stress dose steroids CVP monitoring - 19 this am  Starting attempts at gentle volume removal with CVVHD   NSTEMI:  s/p PCI and arthrectomy from the mid LAD 9/3 Plan Continuing antiplatelet therapy with aspirin and Plavix Tele monitoring Cardiology following   Acute hypoxemic respiratory failure s/p cardiac arrest: likely due to pulmonary edema -gas exchange improved.  -FIO2/PEEP requirements:   Plan Vent support - 8cc/kg  F/u CXR  F/u ABG Volume removal  as able  Daily SBT  Wean peep/FiO2    ESRD on HD: -Started on CRRT on 9/9 Plan Continuing CRRT as guided by nephrology Attempting -17ml/hr as BP tolerates  F/u chem    Intermittent fluid and electrolyte imbalance: Plan Trend chemistries  Leukocytosis  Plan Follow cultures  Empiric abx as above   Mild anemia Thrombocytopenia  Plan Trend cbc  HIT panel pending   HIV; there is report of non-compliance w/ ARTs but last viral load was undetectable  Plan Continue ART medications via tube Repeat T-helper and HIV 1 RNA  COPD Plan Continuing duo nebs  Hyperglycemia Plan Continue TF  Add SSI    Disposition / Summary of Today's Plan 06/10/18   Attempt volume removal   Best Practice / Goals of Care / Disposition.   DVT prophylaxis: SCD's  GI prophylaxis: pepcid Diet: NPO, tube feeds 9/10 Mobility:BR Code Status: Full Family Communication: wife updated bedside 9/12  Labs   CBC: Recent Labs  Lab 06/04/18 0555 06/28/2018 0750 06/13/2018 1210 06/26/2018 0347 06/09/18 0414 06/10/18 0415  WBC 8.0  --  19.3* 20.3* 12.1* 16.5*  NEUTROABS 5.5  --  15.4*  --   --   --   HGB 11.0* 10.9* 11.5* 11.7* 10.1* 9.2*  HCT 34.5* 32.0* 35.6* 36.6* 32.1* 29.5*  MCV 108.2*  --  106.0* 108.0* 109.9* 108.9*  PLT 105*  --  129* 98* 52* 36*   Basic Metabolic Panel: Recent Labs  Lab 06/10/2018 0347 06/27/2018 1253 06/14/2018 1752 06/09/18 0414 06/09/18 1557 06/10/18 0415  NA 135  --  135 136 134* 134*  K 4.7  --  5.0 4.7 4.9 4.8  CL 100  --  103 105 103 103  CO2 22  --  22 23 23  21*  GLUCOSE 81  --  75 73 115* 183*  BUN 34*  --  20 14 15 15   CREATININE 4.54*  --  2.90* 2.03* 1.75* 1.51*  CALCIUM 7.9*  --  8.0* 7.9* 8.0* 8.3*  MG 2.3 2.3 2.3 2.3 2.3 2.5*  PHOS 3.5 3.1 2.8 2.3* 3.1  3.3 2.7  2.6   GFR: Estimated Creatinine Clearance: 56.5 mL/min (A) (by C-G formula based on SCr of 1.51 mg/dL (H)). Recent Labs  Lab 06/15/2018 0529 06/14/2018 0759 06/20/2018 1210 06/21/2018 1458  06/02/2018 0347 06/09/18 0414 06/10/18 0415  WBC  --   --  19.3*  --  20.3* 12.1* 16.5*  LATICACIDVEN 4.3* 7.2* 3.7* 2.5*  --   --   --    Liver Function Tests: Recent Labs  Lab 06/03/2018 0347 06/27/2018 1752 06/09/18 0414 06/09/18 1557 06/10/18 0415  ALBUMIN 2.8* 2.4* 2.3* 2.2* 2.3*   No results for input(s): LIPASE, AMYLASE in the last 168 hours. No results for input(s): AMMONIA in the last 168 hours. ABG    Component Value Date/Time   PHART 7.503 (H) 06/10/2018 0416   PCO2ART 32.9 06/10/2018 0416   PO2ART 244.0 (H) 06/10/2018 0416   HCO3 25.9 06/10/2018 0416  TCO2 27 06/10/2018 0416   ACIDBASEDEF 2.5 (H) 06/09/2018 0415   O2SAT 100.0 06/10/2018 0416    Coagulation Profile: No results for input(s): INR, PROTIME in the last 168 hours. Cardiac Enzymes: Recent Labs  Lab 05/31/2018 1210 06/05/2018 1805  TROPONINI 8.48* 7.29*   HbA1C: Hgb A1c MFr Bld  Date/Time Value Ref Range Status  05/05/2018 07:07 PM 4.5 (L) 4.8 - 5.6 % Final    Comment:    (NOTE) Pre diabetes:          5.7%-6.4% Diabetes:              >6.4% Glycemic control for   <7.0% adults with diabetes    CBG: Recent Labs  Lab 06/09/18 1157 06/09/18 1601 06/09/18 1947 06/10/18 0006 06/10/18 0410  GLUCAP 93 120* 106* 144* 177*     Katy Whiteheart, NP 06/10/2018  9:06 AM Pager: [phone] or [phone]  Attending Note:  52 year old male with extensive cardiac history who presents to PCCM with respiratory failure post cardiac arrest.  On exam, he is more interactive this AM and pressor demands are decreasing.  I reviewed CXR myself, ETT is in a good position.  Will continue full support on the vent for now.  Pressors as ordered.  Add vasopressin.  Continue TF.  CRRT with negative 50 ml/hr.  PCCM will continue care in the ICU as full code for now.  The patient is critically ill with multiple organ systems failure and requires high complexity decision making for assessment and support, frequent  evaluation and titration of therapies, application of advanced monitoring technologies and extensive interpretation of multiple databases.   Critical Care Time devoted to patient care services described in this note is  32  Minutes. This time reflects time of care of this signee Dr Koren Bound. This critical care time does not reflect procedure time, or teaching time or supervisory time of PA/NP/Med student/Med Resident etc but could involve care discussion time.  Alyson Reedy, M.D. [facility] Pulmonary/Critical Care Medicine. Pager: [phone]. After hours pager: [phone].

## 2018-06-10 NOTE — Progress Notes (Signed)
Pharmacy Heparin Induced Thrombocytopenia (HIT) Note:  [name] is a 52 y.o. male being evaluated for HIT. Heparin was started 8/23 for ACS, and baseline platelets were 78. HIT labs were ordered on 9/12 when platelets dropped to 36.   No results found for: HEPINDPLTAB, SRALOWDOSEHP, SRAHIGHDOSEH   Score = 0 Score = 1 Score = 2 Calculated Score  Thrombocytopenia -Platelet fall < 30% -Any platelet fall with nadir < 10 -Platelet fall >50% BUT surgery within 3 days  -Any combination of platelet fall/nadir that does not fit score 0 or 2 -Platelet fall >50% AND nadir ?20 AND no surgery within 3 days    2  Timing -Platelets fall on days ? 4  AND no prior heparin exposure in previous 100 days -Consistent w/ platelet fall on days 5-10 but missing counts -Platelet fall within 1 day of start AND prior exposure to heparin within previous 31-100 days -Platelet fall after day 10 -Platelet fall 5-10 days after starting heparin -Platelet fall within 1 day of heparin start AND prior exposure within previous 5-30 days 0  Thrombosis -None suspected -Recurrent thrombosis in patient receiving therapeutic anticoagulants -Suspected thrombosis (awaiting confirmation via imaging) -Erythematous skin lesions at heparin injection sites -Confirmed new thrombosis -Skin necrosis at injection site -Anaphylactoid reaction to heparin IV bolus -Adrenal hemorrhage 0  oTher Causes -Probable other cause (see protocol for full list) -Possible other cause evident (see protocol for full list) -No alternative explanation 1  Total Calculated 4T Score:      3    Plan / Algorithm Recommendation: At this time, suspicion for HIT is low with 4T score of 3. Likely cause for thrombocytopenia in relation to acute illness and septic shock. No evidence of thrombosis, will hold off on anticoagulation. All heparin products discontinued at this time and heparin allergy entered. HIT antibody ordered, will follow up on results when  available.  Thank you for involving pharmacy in this patient's care.   Wendelyn Breslowylan Sye Schroepfer, PharmD PGY1 Pharmacy Resident Phone: [phone] 06/10/2018 9:50 AM

## 2018-06-10 NOTE — Progress Notes (Signed)
Subjective: Interval History: on vent 2 pressors but bp stable  Objective: Vital signs in last 24 hours: Temp:  [97.5 F (36.4 C)-98.8 F (37.1 C)] 98 F (36.7 C) (09/12 0400) Pulse Rate:  [61-82] 63 (09/12 0700) Resp:  [25-26] 26 (09/12 0420) BP: (88-112)/(57-75) 104/73 (09/12 0700) SpO2:  [100 %] 100 % (09/12 0700) FiO2 (%):  [50 %] 50 % (09/12 0420) Weight:  [68.3 kg] 68.3 kg (09/12 0500) Weight change: -1.6 kg  Intake/Output from previous day: 09/11 0701 - 09/12 0700 In: 4108.1 [I.V.:1828.1; NG/GT:1620; IV Piggyback:660] Out: 4007  Intake/Output this shift: No intake/output data recorded.  General appearance: cooperative, no distress and on vent, awake coop,  Resp: rales bibasilar and rhonchi bibasilar Cardio: S1, S2 normal and systolic murmur: systolic ejection 2/6, crescendo and decrescendo at 2nd left intercostal space GI: soft, pos bs, liver down 5 cm Extremities: edema 2+ and R groin cath  Lab Results: Recent Labs    06/09/18 0414 06/10/18 0415  WBC 12.1* 16.5*  HGB 10.1* 9.2*  HCT 32.1* 29.5*  PLT 52* 36*   BMET:  Recent Labs    06/09/18 1557 06/10/18 0415  NA 134* 134*  K 4.9 4.8  CL 103 103  CO2 23 21*  GLUCOSE 115* 183*  BUN 15 15  CREATININE 1.75* 1.51*  CALCIUM 8.0* 8.3*   No results for input(s): PTH in the last 72 hours. Iron Studies: No results for input(s): IRON, TIBC, TRANSFERRIN, FERRITIN in the last 72 hours.  Studies/Results: Dg Chest Port 1 View  Result Date: 06/09/2018 CLINICAL DATA:  Pulmonary edema. EXAM: PORTABLE CHEST 1 VIEW COMPARISON:  06/03/2018 FINDINGS: 0618 hours. Endotracheal tube tip 5.6 cm above the base of the carina. The NG tube passes into the stomach although the distal tip position is not included on the film. Interval improvement in pulmonary edema pattern. Telemetry leads overlie the chest. IMPRESSION: Interval improvement in pulmonary edema pattern. Electronically Signed   By: Kennith CenterEric  Mansell M.D.   On: 06/09/2018  10:03    I have reviewed the patient's current medications.  Assessment/Plan: 1 ESRD CRRT going well, good solute/acid/base/K.  Needs lower vol with ^ CVP 2 Anemia lower 3 VDRF lower vol 4 CAD per Cards 5 AS 6 low ptlt off hep ? HIV 7 HIV 8 Nutrition TF 9 Schock on steroids, pressors. P CRRT, lower vol, esa, vent , pressors    LOS: 20 days   [name] 06/10/2018,7:29 AM

## 2018-06-10 NOTE — Progress Notes (Signed)
PT Cancellation Note  Patient Details Name: [name] MRN: [id number] DOB: [birth year]   Cancelled Treatment:    Reason Eval/Treat Not Completed: Medical issues which prohibited therapy; remains intubated on CRRT.  Will sign off and await new orders following extubation.    Elray McgregorCynthia Wynn 06/10/2018, 11:11 AM  Sheran Lawlessyndi Wynn, PT Acute Rehabilitation Services [phone] 06/10/2018

## 2018-06-11 ENCOUNTER — Inpatient Hospital Stay (HOSPITAL_COMMUNITY): Payer: Medicare Other

## 2018-06-11 LAB — RENAL FUNCTION PANEL
ALBUMIN: 2.4 g/dL — AB (ref 3.5–5.0)
ALBUMIN: 2.2 g/dL — AB (ref 3.5–5.0)
Anion gap: 11 (ref 5–15)
Anion gap: 13 (ref 5–15)
BUN: 16 mg/dL (ref 6–20)
BUN: 16 mg/dL (ref 6–20)
CO2: 21 mmol/L — ABNORMAL LOW (ref 22–32)
CO2: 22 mmol/L (ref 22–32)
Calcium: 8.8 mg/dL — ABNORMAL LOW (ref 8.9–10.3)
Calcium: 8.7 mg/dL — ABNORMAL LOW (ref 8.9–10.3)
Chloride: 104 mmol/L (ref 98–111)
Chloride: 102 mmol/L (ref 98–111)
Creatinine, Ser: 1.32 mg/dL — ABNORMAL HIGH (ref 0.61–1.24)
Creatinine, Ser: 1.43 mg/dL — ABNORMAL HIGH (ref 0.61–1.24)
GFR calc Af Amer: >60 mL/min (ref >60)
GFR calc Af Amer: >60 mL/min (ref >60)
GFR calc non Af Amer: >60 mL/min (ref >60)
GFR calc non Af Amer: 56 mL/min — ABNORMAL LOW (ref >60)
GLUCOSE: 153 mg/dL — AB (ref 70–99)
GLUCOSE: 103 mg/dL — AB (ref 70–99)
PHOSPHORUS: 1.6 mg/dL — AB (ref 2.5–4.6)
POTASSIUM: 4.5 mmol/L (ref 3.5–5.1)
POTASSIUM: 4.7 mmol/L (ref 3.5–5.1)
Phosphorus: 3.4 mg/dL (ref 2.5–4.6)
Sodium: 136 mmol/L (ref 135–145)
Sodium: 137 mmol/L (ref 135–145)

## 2018-06-11 LAB — CBC
HEMATOCRIT: 29.6 % — AB (ref 39.0–52.0)
Hemoglobin: 9.5 g/dL — ABNORMAL LOW (ref 13.0–17.0)
MCH: 34.5 pg — ABNORMAL HIGH (ref 26.0–34.0)
MCHC: 32.1 g/dL (ref 30.0–36.0)
MCV: 107.6 fL — ABNORMAL HIGH (ref 78.0–100.0)
Platelets: 35 10*3/uL — ABNORMAL LOW (ref 150–400)
RBC: 2.75 MIL/uL — ABNORMAL LOW (ref 4.22–5.81)
RDW: 17.7 % — AB (ref 11.5–15.5)
WBC: 17.7 10*3/uL — ABNORMAL HIGH (ref 4.0–10.5)

## 2018-06-11 LAB — GLUCOSE, CAPILLARY
GLUCOSE-CAPILLARY: 81 mg/dL (ref 70–99)
GLUCOSE-CAPILLARY: 98 mg/dL (ref 70–99)
GLUCOSE-CAPILLARY: 94 mg/dL (ref 70–99)
Glucose-Capillary: 118 mg/dL — ABNORMAL HIGH (ref 70–99)
Glucose-Capillary: 148 mg/dL — ABNORMAL HIGH (ref 70–99)
Glucose-Capillary: 105 mg/dL — ABNORMAL HIGH (ref 70–99)

## 2018-06-11 LAB — LIPASE, BLOOD: Lipase: 21 U/L (ref 11–51)

## 2018-06-11 LAB — HEPARIN INDUCED PLATELET AB (HIT ANTIBODY): Heparin Induced Plt Ab: 0.116 OD (ref 0.000–0.400)

## 2018-06-11 LAB — MAGNESIUM: MAGNESIUM: 2.6 mg/dL — AB (ref 1.7–2.4)

## 2018-06-11 MED ORDER — FENTANYL CITRATE (PF) 100 MCG/2ML IJ SOLN: 100.0000 ug | INTRAMUSCULAR | Status: DC | PRN

## 2018-06-11 MED ORDER — SODIUM PHOSPHATES 45 MMOLE/15ML IV SOLN
30.0000 mmol | Freq: Once | INTRAVENOUS | Status: AC
  Administered 2018-06-11: 30 mmol via INTRAVENOUS
  Filled 2018-06-11: qty 10

## 2018-06-11 MED ORDER — SODIUM CHLORIDE 0.9 % IV SOLN
2.0000 g | INTRAVENOUS | Status: AC
  Administered 2018-06-11 – 2018-06-15 (×5): 2 g via INTRAVENOUS
  Filled 2018-06-11 (×5): qty 20

## 2018-06-11 MED ORDER — DEXMEDETOMIDINE HCL IN NACL 400 MCG/100ML IV SOLN
0.0000 ug/kg/h | INTRAVENOUS | Status: AC
  Administered 2018-06-11: 0.4 ug/kg/h via INTRAVENOUS
  Administered 2018-06-11: 0.5 ug/kg/h via INTRAVENOUS
  Administered 2018-06-12: 0.8 ug/kg/h via INTRAVENOUS
  Filled 2018-06-11 (×3): qty 100

## 2018-06-11 MED ORDER — DARBEPOETIN ALFA 150 MCG/0.3ML IJ SOSY
150.0000 ug | PREFILLED_SYRINGE | INTRAMUSCULAR | Status: DC
  Administered 2018-06-11: 150 ug via SUBCUTANEOUS
  Filled 2018-06-11: qty 0.3

## 2018-06-11 NOTE — Progress Notes (Addendum)
a    [name]  ZOX:[id number] DOB: [birth year] DOA: 05/23/2018 PCP: Patient, No Pcp Per    LOS: 21 days   Reason for Consult / Chief Complaint:  Cardiac Arrest  Consulting MD and date:  [name] 05/30/2018  HPI/Summary of hospital stay:  [name] is a 52 y.o. male with hx severe CAD, AS, sCHF (echo from Aug 2019 with EF 15-20%), HIV on HAART, ESRD on HD (for almost 20 years) TThS. Initially admitted 8/23 with NSTEMI.  On 8/26, had cath that showed multivessel disease; therefore, on 9/3, had PCI to LAD and was also being worked up for a TAVR that he was scheduled to receive 06/14/2018. On 9/9 had brief PEA arrest with CPR thought r/t 3rd degree block.  Had recurrent arrest later that night requiring transcutaneous pacing.  He remained conscious following both episodes but had progressive respiratory failure r/t pulmonary edema requiring intubation.   Went for temp Temp Pacemaker on 9/9, returned to ICU CRRT started. Requiring high PEEP/FIO2 as well as high dose vasopressors for cardiogenic shock.  9/10: still positive volume status. WBCs remain up.  FiO2 and PEEP are improving.   9/12 volume removal with CVVHD limited by hypotension/pressor needs.  Plts trending down-> 36 9/13: PEEP, FiO2, pressor requirements all improved.  Sputum cultures demonstrating Klebsiella and B strep, antibiotics changed to ceftriaxone monotherapy, vomited.  Tube feeds held, checking lipase.  Transitioning sedation to Precedex in anticipation of assessing for readiness for weaning  Subjective/Overnight:  Denies pain.  Denies shortness of breath.  Objective   Blood pressure 101/74, pulse 71, temperature 97.7 F (36.5 C), temperature source Esophageal, resp. rate (Abnormal) 27, height 5\' 11"  (1.803 m), weight 68.4 kg, SpO2 100 %. CVP:  [15 mmHg-21 mmHg] 15 mmHg  Vent Mode: PRVC FiO2 (%):  [40 %-50 %] 40 % Set Rate:  [26 bmp] 26 bmp Vt Set:  [550 mL] 550 mL PEEP:  [5 cmH20-8 cmH20] 8  cmH20 Plateau Pressure:  [25 cmH20-26 cmH20] 26 cmH20   Intake/Output Summary (Last 24 hours) at 06/11/2018 0837 Last data filed at 06/11/2018 0800 Gross per 24 hour  Intake 3454.9 ml  Output 4767 ml  Net -1312.1 ml   Filed Weights   06/09/18 0400 06/10/18 0500 06/11/18 0400  Weight: 69.9 kg 68.3 kg 68.4 kg    Examination: General: 52 year old African-American male currently resting in bed, he is anxious but communicative and cooperative HEENT normocephalic atraumatic orally intubated Pulmonary: Clear to auscultation equal chest rise Cardiac: Regular rate and rhythm Abdomen: Soft, hypoactive bowel sounds.  Did just vomit.   GU: An uric  extremities: Warm dry Neuro: Opens eyes, follows commands, interactive. Consults: date of consult/date signed off & final recs:  Cardiology 8/23 >  Nephrology 8/24 >  CVTS 8/27 >   Procedures: [facility] 8/26 > 1st  diag lesion 100% stenosed, prox LAD to mid LAD 95% stenosed, 2nd diag lesion 99% stenosed, 1st Mrg lesion 90% stenosed, prox RCA to mid RCA 20% stenosed, mid RCA lesion 20% stenosed. 9/9 intubated>>>  Significant Diagnostic Tests: CTA chest 8/30 > enlarged PA, cardiomegaly, CAD.  Paraseptal and centrilobular emphysema, trace right pleural effusion, mild pulmonary edema, Enlarged LAD in mediastinum and retroperitoneal nodal stations, possibly low grade developing lymphoproliferative process. Carotid duplex 8/31 > no acute process.   Micro Data: Blood 9/9 Sputum 9/10: Klebsiella pneumoniae and strep agalactiae  Antimicrobials:  HAART Vanc 9/10>>> 9/13 Cefepime 9/10>>> 9/13 Ceftriaxone 9/13:  Resolved Hospital Problem list  Assessment & Plan:   Cardiac arrest - PEA w/ CHB - s/p transvenous pacemaker 9/9.  Significant AS - was for TAVR 9/10, now on hold  HFrEF  Plan Continue telemetry monitoring  TAVR plans on hold  May need to decide on ICD at some point  Defer to cardiology  Cardiogenic shock: Known HFrEF with LVEF  25-30%.  Possible element of septic shock due to pneumonia -Hemodynamics continue to improve Plan Discontinue vasopressin once levophed dosing less than 10 mcg/min Except map of 50 or greater Volume removal per nephrology Continue current stress dose steroids  NSTEMI:  s/p PCI and arthrectomy from the mid LAD 9/3 Plan Continuing aspirin and Plavix Continue statin Continue telemetry monitoring  Acute hypoxemic respiratory failure s/p cardiac arrest: likely due to pulmonary edema, complicated by probable aspiration pneumonia -Sputum culture demonstrating group B strep, as well as few Klebsiella pneumoniae -Portable chest x-ray personally reviewed:Endotracheal tube is in satisfactory position.  There is mild cardiomegaly.  Comparing serial exams there continues to be clearing of bilateral airspace disease.  There is mild basilar right atelectasis versus infiltrate.  Overall the films have improved Plan Continuing ventilatory support Daily SBT PAD protocol, RASS goal 0 to -1 Continuing volume removal VAP bundle Day #4 cefepime and vancomycin, will change to monotherapy with ceftriaxone and complete 8 days  Acute metabolic encephalopathy status post cardiac arrest -Interactive following commands Plan RASS goal 0-1 Change sedation to Precedex with PRN fentanyl  Vomiting. -Difficult to know if this is from gagging on tube, also may be low-grade ileus Plan Hold tube feeds Check lipase 1 view abdomen in the morning  ESRD on HD: -Started on CRRT on 9/9 Plan Continue volume removal via CRRT   Intermittent fluid and electrolyte imbalance: Plan Trend renal profile and correct electrolytes as indicated Replacing PO4  Leukocytosis White blood cell count remains elevated, treating for pneumonia Plan Trend CBC  Mild anemia Thrombocytopenia  -Hemoglobin remains stable.  Platelets seem to have leveled off as well Plan Trend CBC Holding heparin SCDs  HIV Viral load remains  undetectable, but CD4 count has dropped Plan ART medications via tube   COPD Plan Continuing duo nebs  Hyperglycemia and hypoglycemia Plan Continue current sliding scale insulin  Disposition / Summary of Today's Plan 06/11/18   Looking better.  More awake.  Chest x-ray improved.  Shock state improving.  Did vomit today, not sure if he is gagging on the tube, or if this is an ileus.  For today: Volume removal, except mean arterial pressure greater than 50, transition to Precedex, assess for readiness to wean.  Hopefully we can get him extubated in the next 24 hours or so  Best Practice / Goals of Care / Disposition.   DVT prophylaxis: SCD's  GI prophylaxis: pepcid Diet: NPO, tube feeds 9/10-->hold d/t vomiting.  Mobility:BR Code Status: Full Family Communication: wife updated bedside 9/12  CBC Recent Labs    06/09/18 0414 06/10/18 0415 06/11/18 0308  WBC 12.1* 16.5* 17.7*  HGB 10.1* 9.2* 9.5*  HCT 32.1* 29.5* 29.6*  PLT 52* 36* 35*    Coag's Recent Labs    06/09/18 0414 06/10/18 0415  APTT 84* 52*    BMET Recent Labs    06/10/18 0415 06/10/18 1502 06/11/18 0308  NA 134* 137 136  K 4.8 4.7 4.5  CL 103 104 104  CO2 21* 23 21*  BUN 15 16 16   CREATININE 1.51* 1.38* 1.32*  GLUCOSE 183* 147* 153*    Electrolytes Recent  Labs    06/09/18 1557 06/10/18 0415 06/10/18 1502 06/11/18 0308  CALCIUM 8.0* 8.3* 8.7* 8.8*  MG 2.3 2.5*  --  2.6*  PHOS 3.1  3.3 2.7  2.6 2.0* 1.6*    Sepsis Markers No results for input(s): PROCALCITON, O2SATVEN in the last 72 hours.  Invalid input(s): LACTICACIDVEN  ABG Recent Labs    06/23/2018 0842 06/09/18 0415 06/10/18 0416  PHART 7.357 7.430 7.503*  PCO2ART 44.4 32.5 32.9  PO2ART 391.0* 213* 244.0*    Liver Enzymes Recent Labs    06/10/18 0415 06/10/18 1502 06/11/18 0308  ALBUMIN 2.3* 2.4* 2.4*    Cardiac Enzymes No results for input(s): TROPONINI, PROBNP in the last 72 hours.  Glucose Recent Labs     06/10/18 0804 06/10/18 1200 06/10/18 1616 06/10/18 2000 06/11/18 0007 06/11/18 0735  GLUCAP 143* 131* 122* 105* 118* 81   Caleb MartinetPeter E [name] ACNP-BC [facility]/Critical [facility] Pager # [phone] OR # [phone] if no answer  Attending Note:  52 year old male with extensive PMH who presents s/p cardiac arrest who presents with cardiogenic shock on CRRT.  On exam, pressor demand is improving with coarse BS.  I reviewed CXR myself, ETT is in a good position with edema noted.  Discussed with PCCM-NP.  Will continue CRRT with negative balance for now.  Hold off weaning for now.  Replace electrolytes.  Continue pressors and wean as able.  Wife updated bedside, full code status.  The patient is critically ill with multiple organ systems failure and requires high complexity decision making for assessment and support, frequent evaluation and titration of therapies, application of advanced monitoring technologies and extensive interpretation of multiple databases.   Critical Care Time devoted to patient care services described in this note is  32  Minutes. This time reflects time of care of this signee Dr [name]. This critical care time does not reflect procedure time, or teaching time or supervisory time of PA/NP/Med student/Med Resident etc but could involve care discussion time.  Alyson ReedyWesam G. Paul [name], M.D. [facility]Critical [facility] Medicine. Pager: [phone]. After hours pager: [phone].

## 2018-06-11 NOTE — Progress Notes (Addendum)
At roughly 0915 pt had 2 large, projectile episodes of emesis. Emesis was straight tube feed in appearance. Caleb ResidesPeter Babcock, NP at bedside and ordered TF to be put on hold til abd xray tomorrow in the AM. OGT hooked up to LIS, 500mL TF suctioned from pt's stomach.  VSS, will continue to monitor.

## 2018-06-11 NOTE — Progress Notes (Signed)
Social Note:   We appreciate the great care of the primary team. Family medicine is following along.   Caleb ShoalsHannah Anderson, DO [facility], PGY-1 06/11/2018 6:59 AM

## 2018-06-11 NOTE — Progress Notes (Signed)
Subjective: Interval History: still 2 pressors, lower dose, on vent, less coherent  Objective: Vital signs in last 24 hours: Temp:  [93.9 F (34.4 C)-97.5 F (36.4 C)] 97.5 F (36.4 C) (09/13 0700) Pulse Rate:  [61-71] 67 (09/13 0700) Resp:  [26-28] 26 (09/13 0440) BP: (88-119)/(61-94) 110/78 (09/13 0700) SpO2:  [100 %] 100 % (09/13 0700) FiO2 (%):  [50 %] 50 % (09/13 0440) Weight:  [68.4 kg] 68.4 kg (09/13 0400) Weight change: 0.1 kg  Intake/Output from previous day: 09/12 0701 - 09/13 0700 In: 3463.8 [I.V.:1503.9; NG/GT:1560; IV Piggyback:399.9] Out: 4700  Intake/Output this shift: No intake/output data recorded.  General appearance: no distress, uncooperative and stare, not coop Resp: rales bilaterally and rhonchi bilaterally Cardio: S1, S2 normal and systolic murmur: systolic ejection 2/6, crescendo and decrescendo at 2nd left intercostal space GI: soft, liver down 5 cm,  Extremities: edema 2+ and AVF LUA, Fem cath for HD on R  Lab Results: Recent Labs    06/10/18 0415 06/11/18 0308  WBC 16.5* 17.7*  HGB 9.2* 9.5*  HCT 29.5* 29.6*  PLT 36* 35*   BMET:  Recent Labs    06/10/18 1502 06/11/18 0308  NA 137 136  K 4.7 4.5  CL 104 104  CO2 23 21*  GLUCOSE 147* 153*  BUN 16 16  CREATININE 1.38* 1.32*  CALCIUM 8.7* 8.8*   No results for input(s): PTH in the last 72 hours. Iron Studies: No results for input(s): IRON, TIBC, TRANSFERRIN, FERRITIN in the last 72 hours.  Studies/Results: Dg Chest Port 1 View  Result Date: 06/10/2018 CLINICAL DATA:  ETT present,,dialysis EXAM: PORTABLE CHEST 1 VIEW COMPARISON:  06/09/2018 FINDINGS: Endotracheal tube is in place, 6.4 centimeters above the carina. Nasogastric tube is in place, tip off the image below the gastroesophageal junction. Endovascular stents overlie the LEFT UPPER chest. The heart is mildly enlarged. There are no focal consolidations or pleural effusions. No pulmonary edema. Stable, coarse calcifications  overlying the RIGHT aspect of the heart. IMPRESSION: No evidence for acute cardiopulmonary abnormality. Electronically Signed   By: Norva PavlovElizabeth  Brown M.D.   On: 06/10/2018 09:59    I have reviewed the patient's current medications.  Assessment/Plan: 1 ESRD CRRT going well, tol vol off, still xs ^ CVP,edema.  Fair acid/base, good solute,  2 Anemia stable, check Fe 3 VDRF per CCM 4 AS 5 CAD  6 Cardiac arrest 7 Heart block 8 DM controlled 9 HIV 10 HPTH meds  P Phos repletion, stop binders, vent, wean pressors, cont to get vol off.     LOS: 21 days   Fayrene FearingJames Tiyonna Sardinha 06/11/2018,7:38 AM

## 2018-06-11 NOTE — Progress Notes (Signed)
Progress Note  Patient Name: [name] Date of Encounter: 06/11/2018  Primary Cardiologist: Sanda Klein, MD   Subjective   Patient is currently intubated and sedated, wife at bedside.  Wife in good spirits, feels that he has been improving. All questions answered.  Inpatient Medications    Scheduled Meds: . aspirin  81 mg Oral Daily  . B-complex with vitamin C  1 tablet Per Tube Daily  . chlorhexidine gluconate (MEDLINE KIT)  15 mL Mouth Rinse BID  . Chlorhexidine Gluconate Cloth  6 each Topical Q0600  . clopidogrel  75 mg Oral Daily  . darbepoetin (ARANESP) injection - NON-DIALYSIS  150 mcg Subcutaneous Q Fri  . darunavir-cobicistat  1 tablet Oral Q breakfast  . dolutegravir  50 mg Oral Q supper  . gabapentin  100 mg Oral Daily  . hydrocortisone sodium succinate  50 mg Intravenous Q6H  . insulin aspart  0-20 Units Subcutaneous Q4H  . ipratropium-albuterol  3 mL Nebulization Q6H  . mouth rinse  15 mL Mouth Rinse 10 times per day  . rosuvastatin  10 mg Oral q1800  . sodium chloride flush  3 mL Intravenous Q12H  . tenofovir  300 mg Oral Weekly   Continuous Infusions: . sodium chloride Stopped (06/11/18 0806)  . cefTRIAXone (ROCEPHIN)  IV Stopped (06/11/18 1444)  . dexmedetomidine (PRECEDEX) IV infusion 0.5 mcg/kg/hr (06/11/18 1500)  . famotidine (PEPCID) IV Stopped (06/10/18 2157)  . feeding supplement (VITAL AF 1.2 CAL) Stopped (06/11/18 0940)  . ferric gluconate (FERRLECIT/NULECIT) IV Stopped (06/05/18 1531)  . norepinephrine (LEVOPHED) Adult infusion 10 mcg/min (06/11/18 1500)  . dialysis replacement fluid (prismasate) 800 mL/hr at 06/11/18 1514  . dialysis replacement fluid (prismasate) 700 mL/hr at 06/11/18 1415  . dialysate (PRISMASATE) 2,000 mL/hr at 06/11/18 1335  . sodium chloride    . vasopressin (PITRESSIN) infusion - *FOR SHOCK* 0.03 Units/min (06/11/18 1500)   PRN Meds: sodium chloride, acetaminophen, alteplase, fentaNYL (SUBLIMAZE)  injection, fentaNYL (SUBLIMAZE) injection, midazolam, nitroGLYCERIN, ondansetron (ZOFRAN) IV, polyvinyl alcohol, sodium chloride, sodium chloride flush   Vital Signs    Vitals:   06/11/18 1200 06/11/18 1300 06/11/18 1400 06/11/18 1500  BP: (!) 89/64 96/67 92/63  96/68  Pulse: 68 66 66 66  Resp:      Temp: (!) 97.5 F (36.4 C) (!) 97 F (36.1 C) (!) 97.2 F (36.2 C) (!) 97 F (36.1 C)  TempSrc:      SpO2: 100% 100% 100% 100%  Weight:      Height:        Intake/Output Summary (Last 24 hours) at 06/11/2018 1523 Last data filed at 06/11/2018 1500 Gross per 24 hour  Intake 3228.59 ml  Output 5704 ml  Net -2475.41 ml   Filed Weights   06/09/18 0400 06/10/18 0500 06/11/18 0400  Weight: 69.9 kg 68.3 kg 68.4 kg   Vent Mode: PRVC FiO2 (%):  [30 %-50 %] 30 % Set Rate:  [22 bmp-26 bmp] 22 bmp Vt Set:  [550 mL] 550 mL PEEP:  [5 cmH20-8 cmH20] 5 cmH20 Plateau Pressure:  [20 cmH20-26 cmH20] 20 cmH20  Telemetry    Personally reviewed: intermittently in his own sinus rhythm with only intermittent pacer use  Physical Exam   GEN: intubated and sedated Neck: supple Cardiac: RRR, 3/6 systolic murmur Respiratory: mechanical vent sounds, no wheezing GI: Soft, nontender, non-distended  MS: LUE fistula. Blister on L dorsal foot Neuro:  sedated but moving arms and legs intermittently, occasional eye opening but not to command  Psych: sedated, moving fingers and toes intermittently  Labs    Chemistry Recent Labs  Lab 06/10/18 0415 06/10/18 1502 06/11/18 0308  NA 134* 137 136  K 4.8 4.7 4.5  CL 103 104 104  CO2 21* 23 21*  GLUCOSE 183* 147* 153*  BUN 15 16 16   CREATININE 1.51* 1.38* 1.32*  CALCIUM 8.3* 8.7* 8.8*  ALBUMIN 2.3* 2.4* 2.4*  GFRNONAA 52* 58* >60  GFRAA >60 >60 >60  ANIONGAP 10 10 11      Hematology Recent Labs  Lab 06/09/18 0414 06/10/18 0415 06/11/18 0308  WBC 12.1* 16.5* 17.7*  RBC 2.92* 2.71* 2.75*  HGB 10.1* 9.2* 9.5*  HCT 32.1* 29.5* 29.6*  MCV  109.9* 108.9* 107.6*  MCH 34.6* 33.9 34.5*  MCHC 31.5 31.2 32.1  RDW 18.4* 18.0* 17.7*  PLT 52* 36* 35*   PCI 06/26/2018  Prox LAD lesion is 99% stenosed.  Post intervention, there is a 0% residual stenosis.  A drug-eluting stent was successfully placed.   1. Severe, heavily calcified stenosis in the mid LAD 2. Successful PTCA of the mid LAD with orbital atherectomy and stenting of the mid LAD.   Recommendations: Will continue DAPT with ASA and Plavix. Will continue planning for TAVR.  Recommend uninterrupted dual antiplatelet therapy with Aspirin 26m daily and Clopidogrel 728mdaily for a minimum of 12 months (ACS - Class I recommendation).  Patient Profile     [age].o. 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  Assessment & Plan    #cardiac arrest: initially appeared to be complete heart block, with subsequent arrests appearing consistent with PEA.  -now s/p transvenous wire, predominantly in his own sinus rhythm with only occasional use of TV pacer -recovery will determine planning for permanent system. History of ESRD/fistulas makes permanent transvenous pacing complex, but high pacing burden makes leadless system not ideal. Also has low EF, now s/p PCI and potentially TAVR in the future. Question whether he would be a candidate for ICD as well. Unclear if epicardial system would be an option.  # coronary artery disease -S/P PCI to mLAD with orbital atherectomy on 06/13/2018 -continue DAPT with aspirin and clopidogrel -VT/VF not cause of arrest, no shocks required  # aortic stenosis/aortic insufficiency -TAVR plans on hold given cardiac arrests -will require pacing ability prior to  TAVR  # end-stage renal disease -dialysis per nephrology.   # cardiomyopathy -therapy on hold while on pressors. Weaning levophed  # HIV-per medicine  FULL CODE, confirmed with wife  For questions or updates, please contact [facility] consult [email or web address] for contact info under Cardiology/STEMI.  CRITICAL CARE Patient is critically ill with multiple organ systems affected and requires high complexity decision making. Total critical care time: 32 minutes. This time includes gathering of history, evaluation of patient's response to treatment, examination of patient, review of laboratory and imaging studies, and coordination with consultants.      Patient remains critically ill, with recovery potential unclear.   Signed, BrBuford DresserMD  06/11/2018, 3:23 PM

## 2018-06-12 ENCOUNTER — Inpatient Hospital Stay (HOSPITAL_COMMUNITY): Payer: Medicare Other

## 2018-06-12 LAB — RENAL FUNCTION PANEL
ALBUMIN: 2.3 g/dL — AB (ref 3.5–5.0)
ANION GAP: 15 (ref 5–15)
ANION GAP: 13 (ref 5–15)
Albumin: 2.2 g/dL — ABNORMAL LOW (ref 3.5–5.0)
BUN: 16 mg/dL (ref 6–20)
BUN: 16 mg/dL (ref 6–20)
CALCIUM: 8.6 mg/dL — AB (ref 8.9–10.3)
CHLORIDE: 103 mmol/L (ref 98–111)
CO2: 20 mmol/L — ABNORMAL LOW (ref 22–32)
CO2: 22 mmol/L (ref 22–32)
CREATININE: 1.46 mg/dL — AB (ref 0.61–1.24)
Calcium: 8.7 mg/dL — ABNORMAL LOW (ref 8.9–10.3)
Chloride: 103 mmol/L (ref 98–111)
Creatinine, Ser: 1.34 mg/dL — ABNORMAL HIGH (ref 0.61–1.24)
GFR calc Af Amer: >60 mL/min (ref >60)
GFR calc non Af Amer: >60 mL/min (ref >60)
GFR calc non Af Amer: 54 mL/min — ABNORMAL LOW (ref >60)
GLUCOSE: 111 mg/dL — AB (ref 70–99)
GLUCOSE: 98 mg/dL (ref 70–99)
PHOSPHORUS: 3.3 mg/dL (ref 2.5–4.6)
POTASSIUM: 4.9 mmol/L (ref 3.5–5.1)
Phosphorus: 3.1 mg/dL (ref 2.5–4.6)
Potassium: 4.5 mmol/L (ref 3.5–5.1)
SODIUM: 138 mmol/L (ref 135–145)
Sodium: 138 mmol/L (ref 135–145)

## 2018-06-12 LAB — CBC
HCT: 28.9 % — ABNORMAL LOW (ref 39.0–52.0)
HEMOGLOBIN: 9.3 g/dL — AB (ref 13.0–17.0)
MCH: 34.3 pg — ABNORMAL HIGH (ref 26.0–34.0)
MCHC: 32.2 g/dL (ref 30.0–36.0)
MCV: 106.6 fL — ABNORMAL HIGH (ref 78.0–100.0)
Platelets: 33 10*3/uL — ABNORMAL LOW (ref 150–400)
RBC: 2.71 MIL/uL — AB (ref 4.22–5.81)
RDW: 17.8 % — ABNORMAL HIGH (ref 11.5–15.5)
WBC: 16.1 10*3/uL — ABNORMAL HIGH (ref 4.0–10.5)

## 2018-06-12 LAB — CULTURE, BLOOD (ROUTINE X 2)
Culture: NO GROWTH
Culture: NO GROWTH

## 2018-06-12 LAB — GLUCOSE, CAPILLARY
Glucose-Capillary: 111 mg/dL — ABNORMAL HIGH (ref 70–99)
Glucose-Capillary: 74 mg/dL (ref 70–99)
Glucose-Capillary: 89 mg/dL (ref 70–99)
Glucose-Capillary: 94 mg/dL (ref 70–99)
Glucose-Capillary: 95 mg/dL (ref 70–99)
Glucose-Capillary: 96 mg/dL (ref 70–99)

## 2018-06-12 LAB — MAGNESIUM: Magnesium: 2.6 mg/dL — ABNORMAL HIGH (ref 1.7–2.4)

## 2018-06-12 MED ORDER — IPRATROPIUM-ALBUTEROL 0.5-2.5 (3) MG/3ML IN SOLN: 3.0000 mL | Freq: Four times a day (QID) | RESPIRATORY_TRACT | Status: DC | PRN

## 2018-06-12 MED ORDER — ORAL CARE MOUTH RINSE
15.0000 mL | Freq: Two times a day (BID) | OROMUCOSAL | Status: DC
  Administered 2018-06-14 – 2018-06-16 (×5): 15 mL via OROMUCOSAL

## 2018-06-12 NOTE — Progress Notes (Signed)
Progress Note  Patient Name: [name] Date of Encounter: 06/12/2018  Primary Cardiologist: Sanda Klein, MD   Subjective   Patient is alert this AM, eyes open, shakes head, responds to questions. Planning for extubation later today. Wife in good spirits. No concerns at this point.  Inpatient Medications    Scheduled Meds: . aspirin  81 mg Oral Daily  . B-complex with vitamin C  1 tablet Per Tube Daily  . chlorhexidine gluconate (MEDLINE KIT)  15 mL Mouth Rinse BID  . Chlorhexidine Gluconate Cloth  6 each Topical Q0600  . clopidogrel  75 mg Oral Daily  . darbepoetin (ARANESP) injection - NON-DIALYSIS  150 mcg Subcutaneous Q Fri  . darunavir-cobicistat  1 tablet Oral Q breakfast  . dolutegravir  50 mg Oral Q supper  . gabapentin  100 mg Oral Daily  . hydrocortisone sodium succinate  50 mg Intravenous Q6H  . insulin aspart  0-20 Units Subcutaneous Q4H  . ipratropium-albuterol  3 mL Nebulization Q6H  . mouth rinse  15 mL Mouth Rinse 10 times per day  . rosuvastatin  10 mg Oral q1800  . sodium chloride flush  3 mL Intravenous Q12H  . tenofovir  300 mg Oral Weekly   Continuous Infusions: . sodium chloride Stopped (06/12/18 0935)  . cefTRIAXone (ROCEPHIN)  IV Stopped (06/11/18 1444)  . dexmedetomidine (PRECEDEX) IV infusion Stopped (06/12/18 1003)  . famotidine (PEPCID) IV 20 mg (06/11/18 2217)  . feeding supplement (VITAL AF 1.2 CAL) Stopped (06/11/18 0940)  . ferric gluconate (FERRLECIT/NULECIT) IV Stopped (06/05/18 1531)  . norepinephrine (LEVOPHED) Adult infusion 7 mcg/min (06/12/18 1200)  . dialysis replacement fluid (prismasate) 800 mL/hr at 06/12/18 1014  . dialysis replacement fluid (prismasate) 700 mL/hr at 06/12/18 1214  . dialysate (PRISMASATE) 2,000 mL/hr at 06/12/18 1224  . sodium chloride    . vasopressin (PITRESSIN) infusion - *FOR SHOCK* Stopped (06/12/18 0723)   PRN Meds: sodium chloride, acetaminophen, alteplase, fentaNYL (SUBLIMAZE)  injection, fentaNYL (SUBLIMAZE) injection, midazolam, nitroGLYCERIN, ondansetron (ZOFRAN) IV, polyvinyl alcohol, sodium chloride, sodium chloride flush   Vital Signs    Vitals:   06/12/18 0900 06/12/18 1000 06/12/18 1100 06/12/18 1135  BP:    (!) 84/34  Pulse: 67 67 67 70  Resp:    (!) 22  Temp: (!) 96.6 F (35.9 C) (!) 96.8 F (36 C) (!) 96.8 F (36 C)   TempSrc: Esophageal     SpO2: 100% 100% 100% 100%  Weight:      Height:        Intake/Output Summary (Last 24 hours) at 06/12/2018 1234 Last data filed at 06/12/2018 1200 Gross per 24 hour  Intake 1243.37 ml  Output 2943 ml  Net -1699.63 ml   Filed Weights   06/10/18 0500 06/11/18 0400 06/12/18 0300  Weight: 68.3 kg 68.4 kg 64.6 kg   Vent Mode: CPAP;PSV FiO2 (%):  [30 %] 30 % Set Rate:  [22 bmp] 22 bmp Vt Set:  [550 mL] 550 mL PEEP:  [5 cmH20] 5 cmH20 Pressure Support:  [5 cmH20-10 cmH20] 5 cmH20 Plateau Pressure:  [17 cmH20-21 cmH20] 17 cmH20  Telemetry    Personally reviewed: predominantly sinus rhythm, no prolonged pacer use  Physical Exam   GEN: intubated but alert Neck: supple Cardiac: RRR, 3/6 systolic murmur Respiratory: mechanical vent sounds, no wheezing GI: Soft, nontender, non-distended  MS: LUE fistula. Blister on L dorsal foot Neuro:  moving all 4 limbs, wiggles toes, shakes head Psych: intubated but awake and alert  Labs    Chemistry Recent Labs  Lab 06/11/18 0308 06/11/18 1540 06/12/18 0420  NA 136 137 138  K 4.5 4.7 4.9  CL 104 102 103  CO2 21* 22 20*  GLUCOSE 153* 103* 111*  BUN _0 CREATININE 1.32* 1.43* 1.34*  CALCIUM 8.8* 8.7* 8.7*  ALBUMIN 2.4* 2.2* 2.3*  GFRNONAA >60 56* >60  GFRAA >60 >60 >60  ANIONGAP _1 Hematology Recent Labs  Lab 06/10/18 0415 06/11/18 0308 06/12/18 0419  WBC 16.5* 17.7* 16.1*  RBC 2.71* 2.75* 2.71*  HGB 9.2* 9.5* 9.3*  HCT 29.5* 29.6* 28.9*  MCV 108.9* 107.6* 106.6*  MCH 33.9 34.5* 34.3*  MCHC 31.2 32.1 32.2  RDW 18.0*  17.7* 17.8*  PLT 36* 35* 33*   PCI 06/15/2018  Prox LAD lesion is 99% stenosed.  Post intervention, there is a 0% residual stenosis.  A drug-eluting stent was successfully placed.   1. Severe, heavily calcified stenosis in the mid LAD 2. Successful PTCA of the mid LAD with orbital atherectomy and stenting of the mid LAD.   Recommendations: Will continue DAPT with ASA and Plavix. Will continue planning for TAVR.  Recommend uninterrupted dual antiplatelet therapy with Aspirin 39m daily and Clopidogrel 737mdaily for a minimum of 12 months (ACS - Class I recommendation).  Patient Profile     [age].o. 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  Assessment & Plan    #cardiac arrest: initially appeared to be complete heart block, with subsequent arrests appearing consistent with PEA.  -now s/p transvenous wire, predominantly in his own sinus rhythm -recovery will determine planning for permanent system. History of ESRD/fistulas makes permanent transvenous pacing complex, but high pacing burden makes leadless system not ideal. Also has low EF, now s/p PCI and potentially TAVR in the future. Question whether he would be a candidate for ICD as well. Unclear if epicardial system would be an option. -he is making an astounding recovery considering his comorbidities and hospital course. We will contact the TAVR team next week as he continues to recover to determine eligibilty/timing for TAVR procedure.  # coronary artery disease -S/P PCI to mLAD with orbital atherectomy on 06/26/2018 -continue DAPT with aspirin and clopidogrel -VT/VF not cause of arrest, no shocks required  # aortic stenosis/aortic  insufficiency -TAVR plans on hold given cardiac arrests -will require pacing ability prior to TAVR  # end-stage renal disease -dialysis per nephrology.   # cardiomyopathy -therapy on hold while on pressors. Weaning levophed  # HIV-per medicine  FULL CODE, confirmed with wife  For questions or updates, please contact [facility] consult [email or web address] for contact info under Cardiology/STEMI.  Signed, BrBuford DresserMD  06/12/2018, 12:34 PM

## 2018-06-12 NOTE — Progress Notes (Signed)
I visited with the patient and his wife to follow-up on their wellbeing. The patient was resting and I spent time talking with his wife. I provided spiritual support with pastoral presence and offered prayer with the patient and wife. I shared that the Chaplain is available for spiritual support as needed and requested.    06/12/18 0920  Clinical Encounter Type  Visited With Patient and family together  Visit Type Follow-up;Spiritual support  Referral From Nurse  Consult/Referral To Chaplain  Spiritual Encounters  Spiritual Needs Prayer  Stress Factors  Patient Stress Factors None identified  Family Stress Factors None identified    Chaplain Dr Melvyn NovasMichael Scout Guyett

## 2018-06-12 NOTE — Procedures (Signed)
Extubation Procedure Note  Patient Details:   Name: [name] DOB: Jun 04, 1966 MRN: [id number]   Airway Documentation:    Vent end date: 06/12/18 Vent end time: 1345   Evaluation  O2 sats: stable throughout Complications: No apparent complications Patient did tolerate procedure well. Bilateral Breath Sounds: Diminished   Yes   Pt extubated to 4L venturi mask.  No stridor noted.  RN @ bedside.  Christophe LouisSteven D Jayvian Escoe 06/12/2018, 1:46 PM

## 2018-06-12 NOTE — Progress Notes (Signed)
Subjective: Interval History:still 2 pressors without much  Change, CVP18/  Better vent status.  Objective: Vital signs in last 24 hours: Temp:  [94.3 F (34.6 C)-97.9 F (36.6 C)] 96.1 F (35.6 C) (09/14 0700) Pulse Rate:  [60-86] 62 (09/14 0700) Resp:  [22-27] 22 (09/13 1630) BP: (89-111)/(47-79) 111/79 (09/14 0200) SpO2:  [96 %-100 %] 100 % (09/14 0700) FiO2 (%):  [30 %-40 %] 30 % (09/14 0425) Weight:  [64.6 kg] 64.6 kg (09/14 0300) Weight change: -3.8 kg  Intake/Output from previous day: 09/13 0701 - 09/14 0700 In: 1570.4 [I.V.:980.4; NG/GT:180; IV Piggyback:410] Out: 4394 [Emesis/NG output:900] Intake/Output this shift: No intake/output data recorded.  General appearance: sedated , on vent, moves all extrem. Resp: rales bibasilar and rhonchi bibasilar Cardio: S1, S2 normal and systolic murmur: systolic ejection 3/6, crescendo and decrescendo at 2nd left intercostal space GI: soft, liver down 6 cm Extremities: edema 2+ and R fem cath  Lab Results: Recent Labs    06/11/18 0308 06/12/18 0419  WBC 17.7* 16.1*  HGB 9.5* 9.3*  HCT 29.6* 28.9*  PLT 35* 33*   BMET:  Recent Labs    06/11/18 1540 06/12/18 0420  NA 137 138  K 4.7 4.9  CL 102 103  CO2 22 20*  GLUCOSE 103* 111*  BUN 16 16  CREATININE 1.43* 1.34*  CALCIUM 8.7* 8.7*   No results for input(s): PTH in the last 72 hours. Iron Studies: No results for input(s): IRON, TIBC, TRANSFERRIN, FERRITIN in the last 72 hours.  Studies/Results: Dg Chest Portable 1 View  Result Date: 06/11/2018 CLINICAL DATA:  Check endotracheal tube placement EXAM: PORTABLE CHEST 1 VIEW COMPARISON:  06/10/2018 FINDINGS: Cardiac shadow is enlarged but stable. Endotracheal tube and nasogastric catheter is well as an esophageal probe are seen. Multiple vascular stents are noted bilaterally stable from the prior exams. The lungs are well aerated with mild vascular congestion. Mild right basilar atelectasis is noted. IMPRESSION: Mild  vascular congestion consistent with early CHF. Mild right basilar atelectasis is noted Tubes and lines as described. Electronically Signed   By: Alcide CleverMark  Lukens M.D.   On: 06/11/2018 08:14    I have reviewed the patient's current medications.  Assessment/Plan: 1 ESRD On CRRT, going well. Good solute/acid/base/k.  Lowering vol. Seems to be helping resp status 2 Schock still issue 3 CAD 4 AS  5 VDRF per CCM  6 Anemia esa/Fe 7 Nutrition TF 8 HIV P Vent, CRRT, wean, wean pressors as tol.     LOS: 22 days   [name] 06/12/2018,7:24 AM

## 2018-06-12 NOTE — Progress Notes (Signed)
Pharmacy Antibiotic Note  [name] is a 52 y.o. male admitted on 05/29/2018 with bacteremia.  Pharmacy has been consulted for ceftriaxone dosing.  Currently on CRRT.  Plan: Narrow antibiotic to ceftriaxone 2g q 24 hrs (while on CRRT) Total antibiotic duration 8 days per CCM. D/c antibiotics 9/17.  Height: 5\' 11"  (180.3 cm) Weight: 142 lb 6.7 oz (64.6 kg) IBW/kg (Calculated) : 75.3  Temp (24hrs), Avg:96.2 F (35.7 C), Min:94.3 F (34.6 C), Max:97.9 F (36.6 C)  Recent Labs  Lab 06/06/18 2235  06/10/2018 0529  06/10/2018 0759  06/16/2018 1210 06/09/2018 1458 06/08/18 0347  06/09/18 0414  06/10/18 0415 06/10/18 1502 06/11/18 0308 06/11/18 1540 06/12/18 0419 06/12/18 0420  WBC  --   --   --   --   --    < > 19.3*  --  20.3*  --  12.1*  --  16.5*  --  17.7*  --  16.1*  --   CREATININE 8.78*   < > 9.18*   < >  --   --  9.27* 7.59* 4.54*   < > 2.03*   < > 1.51* 1.38* 1.32* 1.43*  --  1.34*  LATICACIDVEN 4.2*  --  4.3*  --  7.2*  --  3.7* 2.5*  --   --   --   --   --   --   --   --   --   --    < > = values in this interval not displayed.    Estimated Creatinine Clearance: 60.3 mL/min (A) (by C-G formula based on SCr of 1.34 mg/dL (H)).    Allergies  Allergen Reactions  . Iodinated Diagnostic Agents Hives, Itching and Rash    "skin rash - severe" per outside records  . Penicillins Hives and Other (See Comments)    Tolerated Cefepime 1/17 Rx required Hospitalization Has patient had a PCN reaction causing immediate rash, facial/tongue/throat swelling, SOB or lightheadedness with hypotension: Yes Has patient had a PCN reaction causing severe rash involving mucus membranes or skin necrosis: No Has patient had a PCN reaction that required hospitalization Yes Has patient had a PCN reaction occurring within the last 10 years: No   . Shellfish-Derived Products Nausea Only    Irritates the stomach  . Acetaminophen-Codeine Nausea And Vomiting  . Tape Rash and Other (See  Comments)    Use paper tape only (NO CLEAR PLASTIC TAPE!!)    Antimicrobials this admission: 9/10 Vanc LD, 750mg  IV q24h>>>9/13 9/10 Cefepime 2g q12h>>>9/13 9/13 CTX >> [9/17]  Dose adjustments this admission:   Microbiology results: 9/9 BCx x 2: NGTD 9/10 Sputum: few klebsiella (R:ampicillin)   Thank you for allowing pharmacy to be a part of this patient's care.  Jenetta DownerJessica Barnell Shieh, Pharm D, BCPS, [facility] Clinical Pharmacist Phone [phone]  06/12/2018 8:01 AM

## 2018-06-12 NOTE — Progress Notes (Signed)
PULMONARY / CRITICAL CARE MEDICINE   NAME:  [name], MRN:  [id number], DOB:  1966-04-26, LOS: 22 ADMISSION DATE:  June 05, 2018, CONSULTATION DATE:  06/10/2018 REFERRING MD:  [name], CHIEF COMPLAINT:  Cardiac arrest  BRIEF HISTORY:    This is a 52 y.o. male with hx severe CAD, AS, sCHF (echo from Aug 2019 with EF 15-20%), HIV on HAART, ESRD on HD (for almost 20 years) TThS. Initially admitted 8/23 with NSTEMI.  On 8/26, had cath that showed multivessel disease; therefore, on 9/3, had PCI to LAD and was also being worked up for a TAVR that he was scheduled to receive 06/06/2018. On 9/9 had brief PEA arrest with CPR thought r/t 3rd degree block.  Had recurrent arrest later that night requiring transcutaneous pacing.  He remained conscious following both episodes but had progressive respiratory failure r/t pulmonary edema requiring intubation.   Went for temp Temp Pacemaker on 9/9, returned to ICU CRRT started. Requiring high PEEP/FIO2 as well as high dose vasopressors for cardiogenic shock.  9/10: still positive volume status. WBCs remain up.  FiO2 and PEEP are improving.   9/12 volume removal with CVVHD limited by hypotension/pressor needs.  Plts trending down-> 36 9/13: PEEP, FiO2, pressor requirements all improved.  Sputum cultures demonstrating Klebsiella and B strep, antibiotics changed to ceftriaxone monotherapy.  STUDIES:   No recent CULTURES:  Kleb in trach aspirate BC negativ3  ANTIBIOTICS:  Rocephin  LINES/TUBES:   ETT/OGT CONSULTANTS:  Nephrol Cardiol SUBJECTIVE:  Indicating no c/o  CONSTITUTIONAL: BP 95/79   Pulse 71   Temp (!) 97.3 F (36.3 C)   Resp (!) 22   Ht 5\' 11"  (1.803 m)   Wt 64.6 kg   SpO2 100%   BMI 19.86 kg/m   I/O last 3 completed shifts: In: 3178 [I.V.:1718.1; NG/GT:900; IV Piggyback:560] Out: 6649 [Emesis/NG output:900; Other:5749]  CVP:  [12 mmHg-18 mmHg] 12 mmHg  Vent Mode: CPAP;PSV FiO2 (%):  [30 %] 30 % Set Rate:  [22 bmp] 22  bmp Vt Set:  [550 mL] 550 mL PEEP:  [5 cmH20] 5 cmH20 Pressure Support:  [5 cmH20-10 cmH20] 5 cmH20 Plateau Pressure:  [17 cmH20-21 cmH20] 17 cmH20  PHYSICAL EXAM: General:  WD/WN B M NARD Neuro:  No focal findings HEENT:  [place]/AT PRRL; ETT/OGT in place Cardiovascular:  RRR, 3/6 syst m Lungs:  Clear and and lat bilat Abdomen:  Supple, no guarding Musculoskeletal:  No active joints Skin:  No edema  RESOLVED PROBLEM LIST   ASSESSMENT AND PLAN   1) Acute respiratory failure. The patient had been doing well on PSV 5/5 with Vt>500. However, since he has HFrEF my concern with direct-to-extubation was to what extent the reduction in LV afterload with positive-pressure ventilation was augmenting his cardiac performance, considering his need for vasopressors. He was therefore removed from PPV for ~15 min, with planned extubation in the absence of tachypnea, tachycardia and need for increased pressor support.  2) CAD with NSTEMI/cardiogenic shock. Per cardiol is on dual anti-plt Rx. Continue to tritrate levophed, accepting MAPs of at least 50.  3) ESRD on CRRT. Plans per Nephrol  4) Hx COPD. On duonebs  5) Hyperglycemia. Sugars better controlled today. Cont. SSI  SUMMARY OF TODAY'S PLAN:  As above  Best Practice / Goals of Care / Disposition.   DVT PROPHYLAXIS:SCD NUTRITION:TF on hold. Will resume tomorrow after extubation MOBILITY:BR FAMILY DISCUSSIONS: Communication with family at bedside  LABS  Glucose Recent Labs  Lab 06/11/18 1136 06/11/18 1540  06/11/18 1958 06/12/18 0007 06/12/18 0413 06/12/18 0803  GLUCAP 98 105* 94 95 111* 94    BMET Recent Labs  Lab 06/11/18 0308 06/11/18 1540 06/12/18 0420  NA 136 137 138  K 4.5 4.7 4.9  CL 104 102 103  CO2 21* 22 20*  BUN 16 16 16   CREATININE 1.32* 1.43* 1.34*  GLUCOSE 153* 103* 111*    Liver Enzymes Recent Labs  Lab 06/11/18 0308 06/11/18 1540 06/12/18 0420  ALBUMIN 2.4* 2.2* 2.3*    Electrolytes Recent Labs   Lab 06/10/18 0415  06/11/18 0308 06/11/18 1540 06/12/18 0419 06/12/18 0420  CALCIUM 8.3*   < > 8.8* 8.7*  --  8.7*  MG 2.5*  --  2.6*  --  2.6*  --   PHOS 2.7  2.6   < > 1.6* 3.4  --  3.3   < > = values in this interval not displayed.    CBC Recent Labs  Lab 06/10/18 0415 06/11/18 0308 06/12/18 0419  WBC 16.5* 17.7* 16.1*  HGB 9.2* 9.5* 9.3*  HCT 29.5* 29.6* 28.9*  PLT 36* 35* 33*    ABG Recent Labs  Lab 06/20/2018 0842 06/09/18 0415 06/10/18 0416  PHART 7.357 7.430 7.503*  PCO2ART 44.4 32.5 32.9  PO2ART 391.0* 213* 244.0*    Coag's Recent Labs  Lab 06/13/2018 0347 06/09/18 0414 06/10/18 0415  APTT 91* 84* 52*    Sepsis Markers Recent Labs  Lab 06/23/2018 0759 06/19/2018 1210 06/16/2018 1458  LATICACIDVEN 7.2* 3.7* 2.5*    Cardiac Enzymes Recent Labs  Lab 06/24/2018 1210 06/14/2018 1805  TROPONINI 8.48* 7.29*    PAST MEDICAL HISTORY :   He  has a past medical history of Anal condyloma, Anemia, Aortic stenosis, Coronary artery disease, Diarrhea, DJD (degenerative joint disease), ESRD (end stage renal disease) on dialysis (HCC) (01/02/2012), Family history of disseminated HSV infection, Genital herpes, Hemodialysis patient (HCC), History of blood transfusion, HIV infection (HCC), Hypertension, NSTEMI (non-ST elevation myocardial infarction) (HCC), Tertiary syphilis, and Thrombosis of dialysis vascular access (HCC) (01/02/2012).  PAST SURGICAL HISTORY:  He  has a past surgical history that includes AV fistula, brachiocephalic (Right, 66/44/0309/17/12); Appendectomy; Tonsillectomy; Incision and drainage abscess; AV fistula placement (Right, 06/16/11); Revison of arteriovenous fistula (Right, 04/20/2013); Cardiac catheterization (N/A, 03/07/2016); AV fistula placement (Left, 03/17/2016); Esophagogastroduodenoscopy (N/A, 12/09/2016); Esophagogastroduodenoscopy (N/A, 01/05/2017); Esophagogastroduodenoscopy (egd) with propofol (N/A, 01/06/2017); A/V SHUNTOGRAM (Left, 04/15/2017); PERIPHERAL  VASCULAR INTERVENTION (Left, 04/15/2017); RIGHT/LEFT HEART CATH AND CORONARY ANGIOGRAPHY (N/A, 2017/10/02); TEE without cardioversion (N/A, 05/13/2018); CORONARY STENT INTERVENTION (06/20/2018); Cardiac catheterization (06/11/2018); CORONARY STENT INTERVENTION (N/A, 06/23/2018); CORONARY ATHERECTOMY (N/A, 06/04/2018); and TEMPORARY PACEMAKER (N/A, 06/26/2018).  Allergies  Allergen Reactions  . Iodinated Diagnostic Agents Hives, Itching and Rash    "skin rash - severe" per outside records  . Penicillins Hives and Other (See Comments)    Tolerated Cefepime 1/17 Rx required Hospitalization Has patient had a PCN reaction causing immediate rash, facial/tongue/throat swelling, SOB or lightheadedness with hypotension: Yes Has patient had a PCN reaction causing severe rash involving mucus membranes or skin necrosis: No Has patient had a PCN reaction that required hospitalization Yes Has patient had a PCN reaction occurring within the last 10 years: No   . Shellfish-Derived Products Nausea Only    Irritates the stomach  . Acetaminophen-Codeine Nausea And Vomiting  . Tape Rash and Other (See Comments)    Use paper tape only (NO CLEAR PLASTIC TAPE!!)    No current facility-administered medications on file prior to encounter.  Current Outpatient Medications on File Prior to Encounter  Medication Sig  . albuterol (PROVENTIL HFA;VENTOLIN HFA) 108 (90 Base) MCG/ACT inhaler Inhale 2 puffs into the lungs every 6 (six) hours as needed for wheezing or shortness of breath.  . calcium acetate (PHOSLO) 667 MG capsule Take 2,668 mg by mouth See admin instructions. Take 2,668 mg by mouth three times a day with meals and 2,668 mg with each snack  . darunavir (PREZISTA) 800 MG tablet Take 1 tablet (800 mg total) by mouth at bedtime.  Marland Kitchen ibuprofen (ADVIL,MOTRIN) 200 MG tablet Take 600 mg by mouth every 6 (six) hours as needed (for pain or headaches).   . Multiple Vitamins-Minerals (MULTIVITAMIN PO) Take 1 tablet by mouth  daily.  . ritonavir (NORVIR) 100 MG TABS tablet TAKE 1 TABLET BY MOUTH AT BEDTIME. TAKE WITH PREZISTA TABLET  . tenofovir (VIREAD) 300 MG tablet TAKE 1 TABLET BY MOUTH ONCE A WEEK ON SUNDAYS AT BEDTIME  . TIVICAY 50 MG tablet TAKE 1 TABLET(50 MG) BY MOUTH AT BEDTIME  . acyclovir (ZOVIRAX) 400 MG tablet Take 400 mg tablet in evening after each HD session for the next week. (Patient not taking: Reported on 05/29/2018)  . darunavir (PREZISTA) 800 MG tablet TAKE 1 TABLET BY MOUTH AT BEDTIME WITH NORVIR (Patient not taking: Reported on 05/12/2018)  . docusate sodium (COLACE) 100 MG capsule Take 1 capsule (100 mg total) by mouth daily. (Patient not taking: Reported on 05/23/2018)  . dolutegravir (TIVICAY) 50 MG tablet Take 1 tablet (50 mg total) by mouth at bedtime. (Patient not taking: Reported on 05/06/2018)  . hydrOXYzine (ATARAX/VISTARIL) 25 MG tablet Take 1 tablet (25 mg total) by mouth every 6 (six) hours as needed for itching. (Patient not taking: Reported on 05/15/2018)  . pantoprazole (PROTONIX) 40 MG tablet TAKE 1 TABLET BY MOUTH TWICE DAILY (Patient not taking: Reported on 05/20/2018)    FAMILY HISTORY:   His family history includes Arthritis in his father; Diabetes in his mother.  SOCIAL HISTORY:  He  reports that he has been smoking cigarettes. He has a 13.50 pack-year smoking history. He has never used smokeless tobacco. He reports that he does not drink alcohol or use drugs.   Critical care time: 40 min

## 2018-06-12 NOTE — Progress Notes (Signed)
FPTS Social Note  We appreciate the great care provided by CCM. Family medicine will continue to follow along.  Yvonnie Schinke, [place], DO 06/12/2018, 9:16 AM PGY-2, [facility] pager [phone]

## 2018-06-13 LAB — RENAL FUNCTION PANEL
ALBUMIN: 2.2 g/dL — AB (ref 3.5–5.0)
Albumin: 2.1 g/dL — ABNORMAL LOW (ref 3.5–5.0)
Anion gap: 11 (ref 5–15)
Anion gap: 13 (ref 5–15)
BUN: 16 mg/dL (ref 6–20)
BUN: 16 mg/dL (ref 6–20)
CALCIUM: 8.5 mg/dL — AB (ref 8.9–10.3)
CHLORIDE: 102 mmol/L (ref 98–111)
CO2: 23 mmol/L (ref 22–32)
CO2: 24 mmol/L (ref 22–32)
Calcium: 8.4 mg/dL — ABNORMAL LOW (ref 8.9–10.3)
Chloride: 104 mmol/L (ref 98–111)
Creatinine, Ser: 1.34 mg/dL — ABNORMAL HIGH (ref 0.61–1.24)
Creatinine, Ser: 1.44 mg/dL — ABNORMAL HIGH (ref 0.61–1.24)
GFR calc Af Amer: >60 mL/min (ref >60)
GFR calc Af Amer: >60 mL/min (ref >60)
GFR, EST NON AFRICAN AMERICAN: 55 mL/min — AB (ref >60)
GLUCOSE: 84 mg/dL (ref 70–99)
GLUCOSE: 104 mg/dL — AB (ref 70–99)
PHOSPHORUS: 2.4 mg/dL — AB (ref 2.5–4.6)
POTASSIUM: 4.1 mmol/L (ref 3.5–5.1)
Phosphorus: 3.7 mg/dL (ref 2.5–4.6)
Potassium: 4.1 mmol/L (ref 3.5–5.1)
SODIUM: 138 mmol/L (ref 135–145)
Sodium: 139 mmol/L (ref 135–145)

## 2018-06-13 LAB — GLUCOSE, CAPILLARY
GLUCOSE-CAPILLARY: 78 mg/dL (ref 70–99)
GLUCOSE-CAPILLARY: 50 mg/dL — AB (ref 70–99)
GLUCOSE-CAPILLARY: 72 mg/dL (ref 70–99)
Glucose-Capillary: 71 mg/dL (ref 70–99)

## 2018-06-13 LAB — CBC
HEMATOCRIT: 28.3 % — AB (ref 39.0–52.0)
Hemoglobin: 9.1 g/dL — ABNORMAL LOW (ref 13.0–17.0)
MCH: 34.7 pg — ABNORMAL HIGH (ref 26.0–34.0)
MCHC: 32.2 g/dL (ref 30.0–36.0)
MCV: 108 fL — ABNORMAL HIGH (ref 78.0–100.0)
Platelets: 34 10*3/uL — ABNORMAL LOW (ref 150–400)
RBC: 2.62 MIL/uL — ABNORMAL LOW (ref 4.22–5.81)
RDW: 17.8 % — ABNORMAL HIGH (ref 11.5–15.5)
WBC: 11 10*3/uL — ABNORMAL HIGH (ref 4.0–10.5)

## 2018-06-13 LAB — IRON AND TIBC
Iron: 38 ug/dL — ABNORMAL LOW (ref 45–182)
SATURATION RATIOS: 18 % (ref 17.9–39.5)
TIBC: 210 ug/dL — ABNORMAL LOW (ref 250–450)
UIBC: 172 ug/dL

## 2018-06-13 LAB — MAGNESIUM: MAGNESIUM: 2.6 mg/dL — AB (ref 1.7–2.4)

## 2018-06-13 MED ORDER — RENA-VITE PO TABS
1.0000 | ORAL_TABLET | Freq: Every day | ORAL | Status: DC
  Administered 2018-06-13 – 2018-06-16 (×4): 1 via ORAL
  Filled 2018-06-13 (×4): qty 1

## 2018-06-13 MED ORDER — DEXTROSE 50 % IV SOLN
INTRAVENOUS | Status: AC
  Administered 2018-06-13: 50 mL
  Filled 2018-06-13: qty 50

## 2018-06-13 MED ORDER — ZOLPIDEM TARTRATE 5 MG PO TABS
5.0000 mg | ORAL_TABLET | Freq: Once | ORAL | Status: AC
  Administered 2018-06-13: 5 mg via ORAL
  Filled 2018-06-13: qty 1

## 2018-06-13 MED ORDER — SODIUM PHOSPHATES 45 MMOLE/15ML IV SOLN
30.0000 mmol | Freq: Once | INTRAVENOUS | Status: AC
  Administered 2018-06-13: 30 mmol via INTRAVENOUS
  Filled 2018-06-13: qty 10

## 2018-06-13 NOTE — Progress Notes (Signed)
eLink Physician-Brief Progress Note Patient Name: [name] DOB: [birth year] MRN: [id number]   Date of Service  06/13/2018  HPI/Events of Note  Anxiety - Restlessness. Did not sleep last night.   eICU Interventions  Will order: 1. Ambien 5 mg PO at 9 PM.      Intervention Category Major Interventions: Other:  [name],Dionicia Cerritos Eugene 06/13/2018, 7:52 PM

## 2018-06-13 NOTE — Progress Notes (Signed)
Subjective: Interval History: has no complaint, bp freq in 70s sys at dialysis, and does fine.  Off vent, lower doses pressors..  Objective: Vital signs in last 24 hours: Temp:  [96.4 F (35.8 C)-97.6 F (36.4 C)] 97.6 F (36.4 C) (09/15 0400) Pulse Rate:  [64-85] 76 (09/15 0700) Resp:  [22] 22 (09/14 1135) BP: (73-100)/(34-79) 82/62 (09/15 0700) SpO2:  [100 %] 100 % (09/15 0700) FiO2 (%):  [30 %] 30 % (09/14 1331) Weight:  [63.1 kg] 63.1 kg (09/15 0500) Weight change: -1.5 kg  Intake/Output from previous day: 09/14 0701 - 09/15 0700 In: 922.5 [P.O.:240; I.V.:332.6; NG/GT:200; IV Piggyback:149.9] Out: 2011  Intake/Output this shift: No intake/output data recorded.  General appearance: cooperative, no distress and slowed mentation Resp: rales bibasilar and rhonchi bibasilar Cardio: S1, S2 normal and systolic murmur: systolic ejection 3/6, crescendo and decrescendo at 2nd left intercostal space GI: liver down 6 cm , pos bos, soft Extremities: AVF LUA, Hd cath R groin  Lab Results: Recent Labs    06/11/18 0308 06/12/18 0419  WBC 17.7* 16.1*  HGB 9.5* 9.3*  HCT 29.6* 28.9*  PLT 35* 33*   BMET:  Recent Labs    06/12/18 1626 06/13/18 0513  NA 138 138  K 4.5 4.1  CL 103 104  CO2 22 23  GLUCOSE 98 84  BUN 16 16  CREATININE 1.46* 1.34*  CALCIUM 8.6* 8.5*   No results for input(s): PTH in the last 72 hours. Iron Studies: No results for input(s): IRON, TIBC, TRANSFERRIN, FERRITIN in the last 72 hours.  Studies/Results: Dg Abd 1 View  Result Date: 06/12/2018 CLINICAL DATA:  Encounter for ileus EXAM: ABDOMEN - 1 VIEW COMPARISON:  CT 05/08/2018 FINDINGS: Nasogastric tube in the decompressed stomach. Paucity of small bowel gas. Moderate proximal colonic fecal material, decompressed distally. Extensive visceral arterial calcifications. Right femoral transvenous pacing lead to the RV. Left femoral hemodialysis catheter to the common iliac vein. Small caliber left femoral  arterial catheter to the external iliac artery. Regional bones unremarkable. IMPRESSION: 1. Normal bowel gas pattern. 2. Support hardware placement as above. 3. Extensive visceral arterial calcifications. Electronically Signed   By: Corlis Leak  Hassell M.D.   On: 06/12/2018 09:40    I have reviewed the patient's current medications.  Assessment/Plan: 1 ESRD CRRT going well. Vol better, K/acid/base/ ok,  Will keep even as is weaning pressors , if cont improvement, transition to IHD.  Needs P repletion 2 CAD per cards 3 AV dz per cards 4 Heart block 5 Anemia check Fe 6 HPTH check 7 HIV 8 Fevers stable P CRRT, Phos, check Fe, PTH, even,     LOS: 23 days   Johany Hansman 06/13/2018,7:39 AM

## 2018-06-13 NOTE — Progress Notes (Signed)
PULMONARY / CRITICAL CARE MEDICINE   NAME:  [name], MRN:  [id number], DOB:  1966-01-27, LOS: 23 ADMISSION DATE:  04/30/2018, CONSULTATION DATE:  06/16/2018 REFERRING MD:  [name], CHIEF COMPLAINT:  Cardiac arrest  BRIEF HISTORY:    This is a 52 y.o.malewith hx severe CAD, AS, sCHF (echo from Aug 2019 with EF 15-20%), HIV on HAART, ESRD on HD (for almost 20 years). Initially admitted 8/23 with NSTEMI. On 8/26, had cath that showed multivessel disease; therefore, on 9/3, had PCI to LAD and was also being worked up for a TAVR that he was scheduled to receive 06/11/2018. On 9/9 had brief PEA arrest with CPR thought r/t 3rd degree block. Had recurrent arrest later that night requiring transcutaneous pacing. He remained conscious following both episodes but had progressive respiratory failure r/t pulmonary edema requiring intubation.  Went for temp Temp Pacemaker on 9/9, returned to ICU CRRT started. Requiring high PEEP/FIO2 as well as high dose vasopressors for cardiogenic shock.  9/10: still positive volume status. WBCs remain up. FiO2 and PEEP are improving.  9/12 volume removal with CVVHD limited by hypotension/pressor needs. Plts trending down->36 9/13: PEEP, FiO2, pressor requirements all improved. Sputum cultures demonstrating Klebsiella and B strep, antibiotics changed to ceftriaxone monotherapy.   SIGNIFICANT EVENTS:  Extubated 9/14 STUDIES:   No recent CULTURES:  Kleb in trach aspirate Blood negative  ANTIBIOTICS:  Rocephin  LINES/TUBES:   L femoral HD catheter CONSULTANTS:  Nephrol Cardiol SUBJECTIVE:  Doing well post extubation w/o c/o  CONSTITUTIONAL: BP (!) 82/62   Pulse 76   Temp 97.6 F (36.4 C) (Oral)   Resp (!) 22   Ht 5\' 11"  (1.803 m)   Wt 63.1 kg   SpO2 100%   BMI 19.40 kg/m   I/O last 3 completed shifts: In: 1465 [P.O.:240; I.V.:825; NG/GT:200; IV Piggyback:199.9] Out: 3475 [Other:3475]  CVP:  [12 mmHg] 12 mmHg  Vent Mode:  CPAP;PSV FiO2 (%):  [30 %] 30 % PEEP:  [5 cmH20] 5 cmH20 Pressure Support:  [5 cmH20-8 cmH20] 5 cmH20  PHYSICAL EXAM: General:  WED/WN B M NAD Neuro:  Non-focal HEENT:  [place]/AT; corneal scarring, PRRL EOMI; edentulous Cardiovascular:  RRR, 3/6 syst murmur Lungs:  Clear to auscultation Abdomen:  Supple, NT, no H-Smegaly Musculoskeletal:  No active joints Skin:  No edema  RESOLVED PROBLEM LIST   ASSESSMENT AND PLAN   1) S/p Acute respiratory failure. Has done well post-extubation. Nasal O2 2) CAD with NSTEMI/cardiogenic shock. Per cardiol is on dual anti-plt Rx. Continue to tritrate levophed; on 5 now; accepting MAPs of at least 50. 3) ESRD on CRRT. Plans per Nephrol  4) Hx COPD. On duonebs  5) Hyperglycemia. POC glu down to 50 this AM. Should improve with advancing diet.  SUMMARY OF TODAY'S PLAN:  As above  Best Practice / Goals of Care / Disposition.   DVT PROPHYLAXIS:SCD NUTRITION:Advance diet MOBILITY:BR FAMILY DISCUSSIONS: Discussed with wife at bedside   LABS  Glucose Recent Labs  Lab 06/12/18 0803 06/12/18 1625 06/12/18 2019 06/12/18 2329 06/13/18 0305 06/13/18 0745  GLUCAP 94 96 89 74 78 50*    BMET Recent Labs  Lab 06/12/18 0420 06/12/18 1626 06/13/18 0513  NA 138 138 138  K 4.9 4.5 4.1  CL 103 103 104  CO2 20* 22 23  BUN 16 16 16   CREATININE 1.34* 1.46* 1.34*  GLUCOSE 111* 98 84    Liver Enzymes Recent Labs  Lab 06/12/18 0420 06/12/18 1626 06/13/18 0513  ALBUMIN  2.3* 2.2* 2.2*    Electrolytes Recent Labs  Lab 06/11/18 0308  06/12/18 0419 06/12/18 0420 06/12/18 1626 06/13/18 0513  CALCIUM 8.8*   < >  --  8.7* 8.6* 8.5*  MG 2.6*  --  2.6*  --   --  2.6*  PHOS 1.6*   < >  --  3.3 3.1 2.4*   < > = values in this interval not displayed.    CBC Recent Labs  Lab 06/10/18 0415 06/11/18 0308 06/12/18 0419  WBC 16.5* 17.7* 16.1*  HGB 9.2* 9.5* 9.3*  HCT 29.5* 29.6* 28.9*  PLT 36* 35* 33*    ABG Recent Labs  Lab  06/10/2018 0842 06/09/18 0415 06/10/18 0416  PHART 7.357 7.430 7.503*  PCO2ART 44.4 32.5 32.9  PO2ART 391.0* 213* 244.0*    Coag's Recent Labs  Lab 06/26/2018 0347 06/09/18 0414 06/10/18 0415  APTT 91* 84* 52*    Sepsis Markers Recent Labs  Lab 06/15/2018 0759 06/16/2018 1210 06/13/2018 1458  LATICACIDVEN 7.2* 3.7* 2.5*    Cardiac Enzymes Recent Labs  Lab 06/03/2018 1210 06/04/2018 1805  TROPONINI 8.48* 7.29*    PAST MEDICAL HISTORY :   He  has a past medical history of Anal condyloma, Anemia, Aortic stenosis, Coronary artery disease, Diarrhea, DJD (degenerative joint disease), ESRD (end stage renal disease) on dialysis (HCC) (01/02/2012), Family history of disseminated HSV infection, Genital herpes, Hemodialysis patient (HCC), History of blood transfusion, HIV infection (HCC), Hypertension, NSTEMI (non-ST elevation myocardial infarction) (HCC), Tertiary syphilis, and Thrombosis of dialysis vascular access (HCC) (01/02/2012).  PAST SURGICAL HISTORY:  He  has a past surgical history that includes AV fistula, brachiocephalic (Right, 16/10/96); Appendectomy; Tonsillectomy; Incision and drainage abscess; AV fistula placement (Right, 06/16/11); Revison of arteriovenous fistula (Right, 04/20/2013); Cardiac catheterization (N/A, 03/07/2016); AV fistula placement (Left, 03/17/2016); Esophagogastroduodenoscopy (N/A, 12/09/2016); Esophagogastroduodenoscopy (N/A, 01/05/2017); Esophagogastroduodenoscopy (egd) with propofol (N/A, 01/06/2017); A/V SHUNTOGRAM (Left, 04/15/2017); PERIPHERAL VASCULAR INTERVENTION (Left, 04/15/2017); RIGHT/LEFT HEART CATH AND CORONARY ANGIOGRAPHY (N/A, 05/17/2018); TEE without cardioversion (N/A, 04/30/2018); CORONARY STENT INTERVENTION (06-14-18); Cardiac catheterization (2018/06/14); CORONARY STENT INTERVENTION (N/A, Jun 14, 2018); CORONARY ATHERECTOMY (N/A, Jun 14, 2018); and TEMPORARY PACEMAKER (N/A, 06/18/2018).  Allergies  Allergen Reactions  . Iodinated Diagnostic Agents Hives, Itching and  Rash    "skin rash - severe" per outside records  . Penicillins Hives and Other (See Comments)    Tolerated Cefepime 1/17 Rx required Hospitalization Has patient had a PCN reaction causing immediate rash, facial/tongue/throat swelling, SOB or lightheadedness with hypotension: Yes Has patient had a PCN reaction causing severe rash involving mucus membranes or skin necrosis: No Has patient had a PCN reaction that required hospitalization Yes Has patient had a PCN reaction occurring within the last 10 years: No   . Shellfish-Derived Products Nausea Only    Irritates the stomach  . Acetaminophen-Codeine Nausea And Vomiting  . Tape Rash and Other (See Comments)    Use paper tape only (NO CLEAR PLASTIC TAPE!!)    No current facility-administered medications on file prior to encounter.    Current Outpatient Medications on File Prior to Encounter  Medication Sig  . albuterol (PROVENTIL HFA;VENTOLIN HFA) 108 (90 Base) MCG/ACT inhaler Inhale 2 puffs into the lungs every 6 (six) hours as needed for wheezing or shortness of breath.  . calcium acetate (PHOSLO) 667 MG capsule Take 2,668 mg by mouth See admin instructions. Take 2,668 mg by mouth three times a day with meals and 2,668 mg with each snack  . darunavir (PREZISTA) 800 MG tablet Take 1  tablet (800 mg total) by mouth at bedtime.  Marland Kitchen. ibuprofen (ADVIL,MOTRIN) 200 MG tablet Take 600 mg by mouth every 6 (six) hours as needed (for pain or headaches).   . Multiple Vitamins-Minerals (MULTIVITAMIN PO) Take 1 tablet by mouth daily.  . ritonavir (NORVIR) 100 MG TABS tablet TAKE 1 TABLET BY MOUTH AT BEDTIME. TAKE WITH PREZISTA TABLET  . tenofovir (VIREAD) 300 MG tablet TAKE 1 TABLET BY MOUTH ONCE A WEEK ON SUNDAYS AT BEDTIME  . TIVICAY 50 MG tablet TAKE 1 TABLET(50 MG) BY MOUTH AT BEDTIME  . acyclovir (ZOVIRAX) 400 MG tablet Take 400 mg tablet in evening after each HD session for the next week. (Patient not taking: Reported on 05/16/2018)  . darunavir  (PREZISTA) 800 MG tablet TAKE 1 TABLET BY MOUTH AT BEDTIME WITH NORVIR (Patient not taking: Reported on 05/06/2018)  . docusate sodium (COLACE) 100 MG capsule Take 1 capsule (100 mg total) by mouth daily. (Patient not taking: Reported on 05/29/2018)  . dolutegravir (TIVICAY) 50 MG tablet Take 1 tablet (50 mg total) by mouth at bedtime. (Patient not taking: Reported on 05/04/2018)  . hydrOXYzine (ATARAX/VISTARIL) 25 MG tablet Take 1 tablet (25 mg total) by mouth every 6 (six) hours as needed for itching. (Patient not taking: Reported on 05/17/2018)  . pantoprazole (PROTONIX) 40 MG tablet TAKE 1 TABLET BY MOUTH TWICE DAILY (Patient not taking: Reported on 05/05/2018)    FAMILY HISTORY:   His family history includes Arthritis in his father; Diabetes in his mother.  SOCIAL HISTORY:  He  reports that he has been smoking cigarettes. He has a 13.50 pack-year smoking history. He has never used smokeless tobacco. He reports that he does not drink alcohol or use drugs.   Critical care time: 30 min

## 2018-06-13 NOTE — Progress Notes (Signed)
Social Note:   We appreciate the great care provided by CCM. Family medicine will continue to follow along.    Peggyann ShoalsHannah Aamari Strawderman, DO [facility], PGY-1 06/13/2018 9:53 AM

## 2018-06-13 NOTE — Progress Notes (Signed)
Progress Note  Patient Name: [name] Date of Encounter: 06/13/2018  Primary Cardiologist: Thurmon Fair, MD   Subjective   Patient is awake, alert, appropriate this AM. Carried on an entire conversation. Has been doing well post extubation yesterday. Asking pertinent questions about his aortic valve and pacemaker. Wife at bedside.  Inpatient Medications    Scheduled Meds: . aspirin  81 mg Oral Daily  . Chlorhexidine Gluconate Cloth  6 each Topical Q0600  . clopidogrel  75 mg Oral Daily  . darbepoetin (ARANESP) injection - NON-DIALYSIS  150 mcg Subcutaneous Q Fri  . darunavir-cobicistat  1 tablet Oral Q breakfast  . dolutegravir  50 mg Oral Q supper  . gabapentin  100 mg Oral Daily  . hydrocortisone sodium succinate  50 mg Intravenous Q6H  . insulin aspart  0-20 Units Subcutaneous Q4H  . mouth rinse  15 mL Mouth Rinse BID  . multivitamin  1 tablet Oral QHS  . rosuvastatin  10 mg Oral q1800  . sodium chloride flush  3 mL Intravenous Q12H  . tenofovir  300 mg Oral Weekly   Continuous Infusions: . sodium chloride Stopped (06/13/18 0902)  . cefTRIAXone (ROCEPHIN)  IV Stopped (06/12/18 1502)  . dexmedetomidine (PRECEDEX) IV infusion Stopped (06/12/18 1003)  . famotidine (PEPCID) IV Stopped (06/12/18 2227)  . feeding supplement (VITAL AF 1.2 CAL) Stopped (06/11/18 0940)  . ferric gluconate (FERRLECIT/NULECIT) IV Stopped (06/05/18 1531)  . norepinephrine (LEVOPHED) Adult infusion 5 mcg/min (06/13/18 1000)  . dialysis replacement fluid (prismasate) 800 mL/hr at 06/13/18 0757  . dialysis replacement fluid (prismasate) 700 mL/hr at 06/13/18 0302  . dialysate (PRISMASATE) 2,000 mL/hr at 06/13/18 0508  . sodium chloride    . sodium phosphate  Dextrose 5% IVPB 43 mL/hr at 06/13/18 1000  . vasopressin (PITRESSIN) infusion - *FOR SHOCK* Stopped (06/12/18 0723)   PRN Meds: sodium chloride, acetaminophen, alteplase, fentaNYL (SUBLIMAZE) injection, fentaNYL (SUBLIMAZE)  injection, ipratropium-albuterol, midazolam, nitroGLYCERIN, ondansetron (ZOFRAN) IV, polyvinyl alcohol, sodium chloride, sodium chloride flush   Vital Signs    Vitals:   06/13/18 0700 06/13/18 0800 06/13/18 0900 06/13/18 1000  BP: (!) 82/62 (!) 83/66 (!) 76/52 (!) 89/58  Pulse: 76 75 75 76  Resp:      Temp:  (!) 97.5 F (36.4 C)    TempSrc:  Axillary    SpO2: 100% 100% 100% 100%  Weight:      Height:        Intake/Output Summary (Last 24 hours) at 06/13/2018 1044 Last data filed at 06/13/2018 1000 Gross per 24 hour  Intake 711.17 ml  Output 1797 ml  Net -1085.83 ml   Filed Weights   06/11/18 0400 06/12/18 0300 06/13/18 0500  Weight: 68.4 kg 64.6 kg 63.1 kg   Vent Mode: CPAP;PSV FiO2 (%):  [30 %] 30 % PEEP:  [5 cmH20] 5 cmH20 Pressure Support:  [5 cmH20] 5 cmH20  Telemetry    Personally reviewed: sinus rhythm, no pacer use in last 24 hours.  Physical Exam   GEN: Awake, alert, appropriate Neck: supple Cardiac: RRR, 3/6 systolic murmur Respiratory: largely clear in upper fields, slightly diminished at bases GI: Soft, nontender, non-distended  MS: LUE fistula. Blister on L dorsal foot, but denies pain, wiggles toes easily Neuro:  moving all 4 limbs, no gross focal deficit Psych: appropriate, in good spirits  Labs    Chemistry Recent Labs  Lab 06/12/18 0420 06/12/18 1626 06/13/18 0513  NA 138 138 138  K 4.9 4.5 4.1  CL 103 103 104  CO2 20* 22 23  GLUCOSE 111* 98 84  BUN 16 16 16   CREATININE 1.34* 1.46* 1.34*  CALCIUM 8.7* 8.6* 8.5*  ALBUMIN 2.3* 2.2* 2.2*  GFRNONAA >60 54* >60  GFRAA >60 >60 >60  ANIONGAP 15 13 11      Hematology Recent Labs  Lab 06/11/18 0308 06/12/18 0419 06/13/18 0826  WBC 17.7* 16.1* 11.0*  RBC 2.75* 2.71* 2.62*  HGB 9.5* 9.3* 9.1*  HCT 29.6* 28.9* 28.3*  MCV 107.6* 106.6* 108.0*  MCH 34.5* 34.3* 34.7*  MCHC 32.1 32.2 32.2  RDW 17.7* 17.8* 17.8*  PLT 35* 33* 34*   PCI 06/15/2018  Prox LAD lesion is 99% stenosed.  Post  intervention, there is a 0% residual stenosis.  A drug-eluting stent was successfully placed.   1. Severe, heavily calcified stenosis in the mid LAD 2. Successful PTCA of the mid LAD with orbital atherectomy and stenting of the mid LAD.   Recommendations: Will continue DAPT with ASA and Plavix. Will continue planning for TAVR.  Recommend uninterrupted dual antiplatelet therapy with Aspirin 81mg  daily and Clopidogrel 75mg  daily for a minimum of 12 months (ACS - Class I recommendation).  Patient Profile     52 y.o. 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 Extubated on 9/14.  Assessment & Plan    #cardiac arrest: initially appeared to be complete heart block, with subsequent arrests appearing consistent with PEA. He has miraculously recovered, extubated on 9/14, now awake, alert, appropriate. -now s/p transvenous wire, predominantly in his own sinus rhythm. Has not required pacer recently. Today threshold is 0.8 mA (had to overpace to 80 given his native rate). Set to backup at 215mA/50bpm. -recovery will determine planning for permanent system.  -History of ESRD/fistulas makes permanent transvenous pacing complex, but potential for high pacing burden makes leadless system not ideal. Also has low EF, now s/p PCI and potentially TAVR in the future. Question whether he would be a candidate for ICD as well. Unclear if epicardial system would be an option. -Tomorrow we will need the the TAVR team to re-evaluate eligibilty/timing for TAVR procedure. We will also need to ask our EP colleagues to weigh in on pacemaker/ICD.  # coronary artery disease -S/P PCI to mLAD with orbital atherectomy on 06/09/2018 -continue  DAPT with aspirin and clopidogrel -VT/VF not cause of arrest, no shocks required  # aortic stenosis/aortic insufficiency -TAVR plans on hold given cardiac arrests -will likely require pacing ability prior to TAVR given his complete heart block causing arrest  # end-stage renal disease -dialysis per nephrology.   # cardiomyopathy -therapy on hold while on pressors. Weaning levophed  # HIV-per medicine  FULL CODE, confirmed with wife  For questions or updates, please contact [facility] Please consult [email or web address] for contact info under Cardiology/STEMI.  TIME SPENT WITH PATIENT:  25 minutes of direct patient care. More than 50% of that time was spent on coordination of care and counseling regarding current management and plans for future procedures.  Signed, Jodelle RedBridgette Zeniyah Peaster, MD  06/13/2018, 10:44 AM

## 2018-06-14 DIAGNOSIS — Z955 Presence of coronary angioplasty implant and graft: Secondary | ICD-10-CM

## 2018-06-14 LAB — POCT I-STAT, CHEM 8
BUN: 15 mg/dL (ref 6–20)
BUN: 10 mg/dL (ref 6–20)
BUN: 10 mg/dL (ref 6–20)
CALCIUM ION: 0.93 mmol/L — AB (ref 1.15–1.40)
CHLORIDE: 98 mmol/L (ref 98–111)
Calcium, Ion: 0.51 mmol/L — CL (ref 1.15–1.40)
Calcium, Ion: 0.49 mmol/L — CL (ref 1.15–1.40)
Chloride: 94 mmol/L — ABNORMAL LOW (ref 98–111)
Chloride: 95 mmol/L — ABNORMAL LOW (ref 98–111)
Creatinine, Ser: 1.9 mg/dL — ABNORMAL HIGH (ref 0.61–1.24)
Creatinine, Ser: 1.2 mg/dL (ref 0.61–1.24)
Creatinine, Ser: 1.2 mg/dL (ref 0.61–1.24)
Glucose, Bld: 148 mg/dL — ABNORMAL HIGH (ref 70–99)
Glucose, Bld: 198 mg/dL — ABNORMAL HIGH (ref 70–99)
Glucose, Bld: 185 mg/dL — ABNORMAL HIGH (ref 70–99)
HCT: 30 % — ABNORMAL LOW (ref 39.0–52.0)
HEMATOCRIT: 26 % — AB (ref 39.0–52.0)
HEMATOCRIT: 26 % — AB (ref 39.0–52.0)
HEMOGLOBIN: 10.2 g/dL — AB (ref 13.0–17.0)
HEMOGLOBIN: 8.8 g/dL — AB (ref 13.0–17.0)
HEMOGLOBIN: 8.8 g/dL — AB (ref 13.0–17.0)
POTASSIUM: 4 mmol/L (ref 3.5–5.1)
Potassium: 3.7 mmol/L (ref 3.5–5.1)
Potassium: 3.7 mmol/L (ref 3.5–5.1)
SODIUM: 138 mmol/L (ref 135–145)
SODIUM: 139 mmol/L (ref 135–145)
SODIUM: 139 mmol/L (ref 135–145)
TCO2: 25 mmol/L (ref 22–32)
TCO2: 26 mmol/L (ref 22–32)
TCO2: 27 mmol/L (ref 22–32)

## 2018-06-14 LAB — MAGNESIUM: Magnesium: 2.6 mg/dL — ABNORMAL HIGH (ref 1.7–2.4)

## 2018-06-14 LAB — RENAL FUNCTION PANEL
ALBUMIN: 2 g/dL — AB (ref 3.5–5.0)
ALBUMIN: 1.9 g/dL — AB (ref 3.5–5.0)
Anion gap: 9 (ref 5–15)
Anion gap: 15 (ref 5–15)
BUN: 11 mg/dL (ref 6–20)
BUN: 14 mg/dL (ref 6–20)
CALCIUM: 8.1 mg/dL — AB (ref 8.9–10.3)
CHLORIDE: 104 mmol/L (ref 98–111)
CHLORIDE: 100 mmol/L (ref 98–111)
CO2: 25 mmol/L (ref 22–32)
CO2: 24 mmol/L (ref 22–32)
CREATININE: 1.38 mg/dL — AB (ref 0.61–1.24)
CREATININE: 2.04 mg/dL — AB (ref 0.61–1.24)
Calcium: 8.2 mg/dL — ABNORMAL LOW (ref 8.9–10.3)
GFR calc non Af Amer: 58 mL/min — ABNORMAL LOW (ref >60)
GFR, EST AFRICAN AMERICAN: 42 mL/min — AB (ref >60)
GFR, EST NON AFRICAN AMERICAN: 36 mL/min — AB (ref >60)
Glucose, Bld: 90 mg/dL (ref 70–99)
Glucose, Bld: 150 mg/dL — ABNORMAL HIGH (ref 70–99)
PHOSPHORUS: 1.3 mg/dL — AB (ref 2.5–4.6)
Phosphorus: 2.9 mg/dL (ref 2.5–4.6)
Potassium: 3.6 mmol/L (ref 3.5–5.1)
Potassium: 4 mmol/L (ref 3.5–5.1)
SODIUM: 139 mmol/L (ref 135–145)
Sodium: 138 mmol/L (ref 135–145)

## 2018-06-14 LAB — GLUCOSE, CAPILLARY
GLUCOSE-CAPILLARY: 70 mg/dL (ref 70–99)
GLUCOSE-CAPILLARY: 88 mg/dL (ref 70–99)
Glucose-Capillary: 72 mg/dL (ref 70–99)
Glucose-Capillary: 84 mg/dL (ref 70–99)
Glucose-Capillary: 120 mg/dL — ABNORMAL HIGH (ref 70–99)
Glucose-Capillary: 171 mg/dL — ABNORMAL HIGH (ref 70–99)
Glucose-Capillary: 126 mg/dL — ABNORMAL HIGH (ref 70–99)

## 2018-06-14 LAB — CBC
HCT: 26.6 % — ABNORMAL LOW (ref 39.0–52.0)
Hemoglobin: 8.3 g/dL — ABNORMAL LOW (ref 13.0–17.0)
MCH: 33.6 pg (ref 26.0–34.0)
MCHC: 31.2 g/dL (ref 30.0–36.0)
MCV: 107.7 fL — AB (ref 78.0–100.0)
PLATELETS: 44 10*3/uL — AB (ref 150–400)
RBC: 2.47 MIL/uL — AB (ref 4.22–5.81)
RDW: 17.7 % — ABNORMAL HIGH (ref 11.5–15.5)
WBC: 9.7 10*3/uL (ref 4.0–10.5)

## 2018-06-14 MED ORDER — MIDODRINE HCL 5 MG PO TABS
5.0000 mg | ORAL_TABLET | Freq: Three times a day (TID) | ORAL | Status: DC
  Administered 2018-06-14 – 2018-06-15 (×3): 5 mg via ORAL
  Filled 2018-06-14 (×3): qty 1

## 2018-06-14 MED ORDER — GERHARDT'S BUTT CREAM
TOPICAL_CREAM | Freq: Three times a day (TID) | CUTANEOUS | Status: DC | PRN
  Administered 2018-06-15: via TOPICAL
  Filled 2018-06-14: qty 1

## 2018-06-14 MED ORDER — HEPARIN SODIUM (PORCINE) 1000 UNIT/ML DIALYSIS
1000.0000 [IU] | INTRAMUSCULAR | Status: DC | PRN
  Administered 2018-06-14: 3000 [IU] via INTRAVENOUS_CENTRAL
  Filled 2018-06-14 (×2): qty 6

## 2018-06-14 MED ORDER — DEXTROSE 5 % IV SOLN
Status: DC
  Administered 2018-06-14 – 2018-06-16 (×6): via INTRAVENOUS_CENTRAL
  Filled 2018-06-14 (×9): qty 1500

## 2018-06-14 MED ORDER — DEXTROSE 5 % IV SOLN
20.0000 g | INTRAVENOUS | Status: DC
  Administered 2018-06-14 – 2018-06-16 (×4): 20 g via INTRAVENOUS_CENTRAL
  Filled 2018-06-14 (×4): qty 200

## 2018-06-14 MED ORDER — SODIUM PHOSPHATES 45 MMOLE/15ML IV SOLN
30.0000 mmol | Freq: Once | INTRAVENOUS | Status: AC
  Administered 2018-06-14: 30 mmol via INTRAVENOUS
  Filled 2018-06-14: qty 10

## 2018-06-14 MED ORDER — ZOLPIDEM TARTRATE 5 MG PO TABS
10.0000 mg | ORAL_TABLET | Freq: Every evening | ORAL | Status: DC | PRN
  Administered 2018-06-14: 10 mg via ORAL
  Filled 2018-06-14: qty 2

## 2018-06-14 MED ORDER — SODIUM CHLORIDE 0.9 % IV BOLUS: 500.0000 mL | INTRAVENOUS | Status: DC | PRN

## 2018-06-14 NOTE — Consult Note (Signed)
Cardiology Consultation:   Patient ID: [name] MRN: [id number]; DOB: 04/23/1966  Admit date: 05/26/2018 Date of Consult: 06/14/2018  Primary Care Provider: Patient, No Pcp Per Primary Cardiologist: Thurmon Fair, MD  Primary Electrophysiologist:  None new   Patient Profile:   [name] is a 52 y.o. male with a hx of CHB and AS who is being seen today for the evaluation of CHB for possible PPM at the request of Dr.Croitoru.  History of Present Illness:   [name] has multiple medical problems including ESRD on HD for almost 20 years. He has HIV as well. He is cachectic. He presented with malaise and chest pain and borderline troponin elevation and was found to have NSTEMI and then had CHB requiring temp PM. He has had a long stay in the ICU and amazingly awakened. He has require the use of his temp PM and is tolerating HD. He is thought to have critical AS but of the low flow variety.  He has been dialyzed in the past from the right subclavian as well as the right arm but this site occluded. He is currently being dialyzed by the right femoral vein but also has a fistula in the left arm and I am not sure if this works. A temporary wire is in the right femoral vein.   Past Medical History:  Diagnosis Date  . Anal condyloma   . Anemia   . Aortic stenosis    a. 12/2016 Echo: EF 55-60%, no rwma, possibly bicuspid AoV, mod thickened, mod Ca2+. Fusion of R-L coronary commissure. Cusp separatino mod reduced. Mild AI. Peak velocity (S) 245 cm/s. Mean gradient (S) .   Marland Kitchen Coronary artery disease   . Diarrhea    occurs because of dialysis  . DJD (degenerative joint disease)    right knee.  All joints  . ESRD (end stage renal disease) on dialysis (HCC) 01/02/2012   Gets diaysis at [facility] on [employer], TTS schedule.  Started dialysis in 2000.     . Family history of disseminated HSV infection   . Genital herpes   . Hemodialysis patient [facility])    Tuesday,  Thursday, Saturday  . History of blood transfusion   . HIV infection (HCC)   . Hypertension   . NSTEMI (non-ST elevation myocardial infarction) (HCC)   . Tertiary syphilis   . 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 Reclotted on 03/02/13 and IR placed L IJ tunneled HD catheter (a right external jugular tunneled HD cath was attempted first but did not function due to kinking across the clavicle). Pt was seen by VVS on same admission 6/614 and said RUA AVF no longer usable, they ordered vein mapping and will see him as outpatient to set up next permanent access.       Past Surgical History:  Procedure Laterality Date  . A/V SHUNTOGRAM Left 04/15/2017   Procedure: A/V Shuntogram;  Surgeon: Maeola Harman, MD;  Location: [facility] INVASIVE CV LAB;  Service: Cardiovascular;  Laterality: Left;  . APPENDECTOMY    . AV FISTULA PLACEMENT Right 06/16/11  . AV FISTULA PLACEMENT Left 03/17/2016   Procedure: INSERTION OF ARTERIOVENOUS (AV) GORE-TEX GRAFT ARM USING GORETEX 4-7MM X 45CM STRETCH GRAFT;  Surgeon: Sherren Kerns, MD;  Location: [facility] OR;  Service: Vascular;  Laterality: Left;  . AV FISTULA PLACEMENT, BRACHIOCEPHALIC Right 06/16/11  . CARDIAC CATHETERIZATION  06/05/2018  . CORONARY  ATHERECTOMY N/A 06/15/2018   Procedure: CORONARY ATHERECTOMY;  Surgeon: Kathleene Hazel, MD;  Location: [facility] INVASIVE CV LAB;  Service: Cardiovascular;  Laterality: N/A;  . CORONARY STENT INTERVENTION  06/06/2018  . CORONARY STENT INTERVENTION N/A 06/19/2018   Procedure: CORONARY STENT INTERVENTION;  Surgeon: Kathleene Hazel, MD;  Location: [facility] INVASIVE CV LAB;  Service: Cardiovascular;  Laterality: N/A;  . ESOPHAGOGASTRODUODENOSCOPY N/A 12/09/2016   Procedure: ESOPHAGOGASTRODUODENOSCOPY (EGD);  Surgeon: Kathi Der, MD;  Location: [facility] ENDOSCOPY;  Service: Gastroenterology;  Laterality: N/A;  . ESOPHAGOGASTRODUODENOSCOPY N/A 01/05/2017    Procedure: ESOPHAGOGASTRODUODENOSCOPY (EGD);  Surgeon: Vida Rigger, MD;  Location: [facility] ENDOSCOPY;  Service: Endoscopy;  Laterality: N/A;  . ESOPHAGOGASTRODUODENOSCOPY (EGD) WITH PROPOFOL N/A 01/06/2017   Procedure: ESOPHAGOGASTRODUODENOSCOPY (EGD) WITH PROPOFOL;  Surgeon: Kathi Der, MD;  Location: [facility] ENDOSCOPY;  Service: Gastroenterology;  Laterality: N/A;  . INCISION AND DRAINAGE ABSCESS     abdominal  . PERIPHERAL VASCULAR CATHETERIZATION N/A 03/07/2016   Procedure: Venogram;  Surgeon: Sherren Kerns, MD;  Location: [facility] INVASIVE CV LAB;  Service: Cardiovascular;  Laterality: N/A;  . PERIPHERAL VASCULAR INTERVENTION Left 04/15/2017   Procedure: Peripheral Vascular Intervention;  Surgeon: Maeola Harman, MD;  Location: [facility] INVASIVE CV LAB;  Service: Cardiovascular;  Laterality: Left;  . REVISON OF ARTERIOVENOUS FISTULA Right 04/20/2013   Procedure: INSERTION OF ARTERIOVENOUS GORTEX GRAFT;  Surgeon: Sherren Kerns, MD;  Location: [facility] OR;  Service: Vascular;  Laterality: Right;  Ultrasound Guided  . RIGHT/LEFT HEART CATH AND CORONARY ANGIOGRAPHY N/A 05/16/2018   Procedure: RIGHT/LEFT HEART CATH AND CORONARY ANGIOGRAPHY;  Surgeon: Lennette Bihari, MD;  Location: [facility] INVASIVE CV LAB;  Service: Cardiovascular;  Laterality: N/A;  . TEE WITHOUT CARDIOVERSION N/A 05/04/2018   Procedure: TRANSESOPHAGEAL ECHOCARDIOGRAM (TEE);  Surgeon: Chilton Si, MD;  Location: [facility] ENDOSCOPY;  Service: Cardiovascular;  Laterality: N/A;  . TEMPORARY PACEMAKER N/A 06/20/2018   Procedure: TEMPORARY PACEMAKER;  Surgeon: Yvonne Kendall, MD;  Location: [facility] INVASIVE CV LAB;  Service: Cardiovascular;  Laterality: N/A;  . TONSILLECTOMY       Home Medications:  Prior to Admission medications   Medication Sig Start Date End Date Taking? Authorizing Provider  albuterol (PROVENTIL HFA;VENTOLIN HFA) 108 (90 Base) MCG/ACT inhaler Inhale 2 puffs into the lungs every 6 (six) hours as needed for wheezing or shortness of  breath.   Yes [provider]  calcium acetate (PHOSLO) 667 MG capsule Take 2,668 mg by mouth See admin instructions. Take 2,668 mg by mouth three times a day with meals and 2,668 mg with each snack   Yes [provider]  darunavir (PREZISTA) 800 MG tablet Take 1 tablet (800 mg total) by mouth at bedtime. 02/08/18  Yes Cliffton [name], MD  ibuprofen (ADVIL,MOTRIN) 200 MG tablet Take 600 mg by mouth every 6 (six) hours as needed (for pain or headaches).    Yes [provider]  Multiple Vitamins-Minerals (MULTIVITAMIN PO) Take 1 tablet by mouth daily.   Yes [provider]  ritonavir (NORVIR) 100 MG TABS tablet TAKE 1 TABLET BY MOUTH AT BEDTIME. TAKE WITH PREZISTA TABLET 02/08/18  Yes Cliffton [name], MD  tenofovir (VIREAD) 300 MG tablet TAKE 1 TABLET BY MOUTH ONCE A WEEK ON SUNDAYS AT BEDTIME 02/08/18  Yes Cliffton [name], MD  TIVICAY 50 MG tablet TAKE 1 TABLET(50 MG) BY MOUTH AT BEDTIME 02/08/18  Yes Cliffton [name], MD  acyclovir (ZOVIRAX) 400 MG tablet Take 400 mg tablet in evening after each HD session for the next week. Patient not  taking: Reported on 05/25/2018 10/10/17   Arvilla Market, DO  darunavir (PREZISTA) 800 MG tablet TAKE 1 TABLET BY MOUTH AT BEDTIME WITH NORVIR Patient not taking: Reported on 05/29/2018 10/22/17   Cliffton [name], MD  docusate sodium (COLACE) 100 MG capsule Take 1 capsule (100 mg total) by mouth daily. Patient not taking: Reported on 05/14/2018 12/10/16   Renne Musca, MD  dolutegravir (TIVICAY) 50 MG tablet Take 1 tablet (50 mg total) by mouth at bedtime. Patient not taking: Reported on 04/30/2018 10/22/17   Cliffton [name], MD  hydrOXYzine (ATARAX/VISTARIL) 25 MG tablet Take 1 tablet (25 mg total) by mouth every 6 (six) hours as needed for itching. Patient not taking: Reported on 05/29/2018 10/28/17   Wendee Beavers, DO  pantoprazole (PROTONIX) 40 MG tablet TAKE 1 TABLET BY MOUTH TWICE DAILY Patient not taking: Reported on  05/14/2018 01/08/17   Almon Hercules, MD    Inpatient Medications: Scheduled Meds: . aspirin  81 mg Oral Daily  . clopidogrel  75 mg Oral Daily  . darbepoetin (ARANESP) injection - NON-DIALYSIS  150 mcg Subcutaneous Q Fri  . darunavir-cobicistat  1 tablet Oral Q breakfast  . dolutegravir  50 mg Oral Q supper  . gabapentin  100 mg Oral Daily  . hydrocortisone sodium succinate  50 mg Intravenous Q6H  . mouth rinse  15 mL Mouth Rinse BID  . midodrine  5 mg Oral TID WC  . multivitamin  1 tablet Oral QHS  . rosuvastatin  10 mg Oral q1800  . sodium chloride flush  3 mL Intravenous Q12H  . tenofovir  300 mg Oral Weekly   Continuous Infusions: . sodium chloride 10 mL/hr at 06/14/18 1000  . calcium gluconate infusion for CRRT    . cefTRIAXone (ROCEPHIN)  IV Stopped (06/13/18 1555)  . famotidine (PEPCID) IV Stopped (06/13/18 2124)  . ferric gluconate (FERRLECIT/NULECIT) IV Stopped (06/05/18 1531)  . norepinephrine (LEVOPHED) Adult infusion 6 mcg/min (06/14/18 1000)  . dialysis replacement fluid (prismasate) 800 mL/hr at 06/14/18 0357  . dialysis replacement fluid (prismasate) 700 mL/hr at 06/14/18 0344  . dialysate (PRISMASATE) 2,000 mL/hr at 06/14/18 0616  . sodium chloride    . sodium chloride    . sodium citrate 2 %/dextrose 2.5% solution 3000 mL    . sodium phosphate  Dextrose 5% IVPB     PRN Meds: sodium chloride, acetaminophen, heparin, ipratropium-albuterol, midazolam, nitroGLYCERIN, ondansetron (ZOFRAN) IV, polyvinyl alcohol, sodium chloride, sodium chloride, sodium chloride flush, zolpidem  Allergies:    Allergies  Allergen Reactions  . Iodinated Diagnostic Agents Hives, Itching and Rash    "skin rash - severe" per outside records  . Penicillins Hives and Other (See Comments)    Tolerated Cefepime 1/17 Rx required Hospitalization Has patient had a PCN reaction causing immediate rash, facial/tongue/throat swelling, SOB or lightheadedness with hypotension: Yes Has patient had  a PCN reaction causing severe rash involving mucus membranes or skin necrosis: No Has patient had a PCN reaction that required hospitalization Yes Has patient had a PCN reaction occurring within the last 10 years: No   . Shellfish-Derived Products Nausea Only    Irritates the stomach  . Acetaminophen-Codeine Nausea And Vomiting  . Tape Rash and Other (See Comments)    Use paper tape only (NO CLEAR PLASTIC TAPE!!)    Social History:   Social History   Socioeconomic History  . Marital status: Married    Spouse name: Not on file  . Number of children: Not on  file  . Years of education: Not on file  . Highest education level: Not on file  Occupational History  . Not on file  Social Needs  . Financial resource strain: Not on file  . Food insecurity:    Worry: Not on file    Inability: Not on file  . Transportation needs:    Medical: Not on file    Non-medical: Not on file  Tobacco Use  . Smoking status: Heavy Tobacco Smoker    Packs/day: 0.50    Years: 27.00    Pack years: 13.50    Types: Cigarettes  . Smokeless tobacco: Never Used  . Tobacco comment: currently smoking ~ 1/3 ppd.  Substance and Sexual Activity  . Alcohol use: No    Alcohol/week: 0.0 standard drinks    Comment: 03/04/2013 "stopped drinking ~ 12 yr ago; never had problem w/it".    . Drug use: No  . Sexual activity: Not Currently    Partners: Female    Birth control/protection: Condom    Comment: declined condoms  Lifestyle  . Physical activity:    Days per week: Not on file    Minutes per session: Not on file  . Stress: Not on file  Relationships  . Social connections:    Talks on phone: Not on file    Gets together: Not on file    Attends religious service: Not on file    Active member of club or organization: Not on file    Attends meetings of clubs or organizations: Not on file    Relationship status: Not on file  . Intimate partner violence:    Fear of current or ex partner: Not on file     Emotionally abused: Not on file    Physically abused: Not on file    Forced sexual activity: Not on file  Other Topics Concern  . Not on file  Social History Narrative   [place].    Family History:    Family History  Problem Relation Age of Onset  . Diabetes Mother   . Arthritis Father        Gout- Foot     ROS:  Please see the history of present illness.   All other ROS reviewed and negative.     Physical Exam/Data:   Vitals:   06/14/18 0800 06/14/18 0815 06/14/18 0900 06/14/18 1000  BP: (!) 83/58  (!) 77/48 (!) 86/58  Pulse: 84  88 88  Resp:      Temp:  97.9 F (36.6 C)    TempSrc:  Oral    SpO2: 100%  100% 100%  Weight:      Height:        Intake/Output Summary (Last 24 hours) at 06/14/2018 1123 Last data filed at 06/14/2018 1000 Gross per 24 hour  Intake 685.19 ml  Output 661 ml  Net 24.19 ml   Filed Weights   06/12/18 0300 06/13/18 0500 06/14/18 0500  Weight: 64.6 kg 63.1 kg 63.3 kg   Body mass index is 19.46 kg/m.  General:  cachectic, well developed, in no acute distress HEENT: normal Lymph: no adenopathy Neck: 7 cm JVD Endocrine:  No thryomegaly Vascular: No carotid bruits; FA pulses 2+ bilaterally without bruits  Cardiac:  normal S1, S2; RRR soft systolic murmur Lungs:  clear to auscultation bilaterally, no wheezing, rhonchi or rales  Abd: soft, nontender, no hepatomegaly  Ext: no edema, catheters in both groins Musculoskeletal:  No deformities, BUE and  BLE strength normal and equal Skin: warm and dry  Neuro:  CNs 2-12 intact, no focal abnormalities noted Psych:  Normal affect   EKG:  The EKG was personally reviewed and demonstrates:  NSR with RBBB Telemetry:  Telemetry was personally reviewed and demonstrates:  NSR with RBBB and PVC"s  Relevant CV Studies: Reviewed cath and echo  Laboratory Data:  Chemistry Recent Labs  Lab 06/13/18 0513 06/13/18 1525 06/14/18 0349  NA 138 139 138  K 4.1 4.1 3.6  CL 104 102 104    CO2 23 24 25   GLUCOSE 84 104* 90  BUN 16 16 11   CREATININE 1.34* 1.44* 1.38*  CALCIUM 8.5* 8.4* 8.2*  GFRNONAA >60 55* 58*  GFRAA >60 >60 >60  ANIONGAP 11 13 9     Recent Labs  Lab 06/13/18 0513 06/13/18 1525 06/14/18 0349  ALBUMIN 2.2* 2.1* 2.0*   Hematology Recent Labs  Lab 06/12/18 0419 06/13/18 0826 06/14/18 0345  WBC 16.1* 11.0* 9.7  RBC 2.71* 2.62* 2.47*  HGB 9.3* 9.1* 8.3*  HCT 28.9* 28.3* 26.6*  MCV 106.6* 108.0* 107.7*  MCH 34.3* 34.7* 33.6  MCHC 32.2 32.2 31.2  RDW 17.8* 17.8* 17.7*  PLT 33* 34* 44*   Cardiac Enzymes Recent Labs  Lab 06/28/2018 1210 06/19/2018 1805  TROPONINI 8.48* 7.29*   No results for input(s): TROPIPOC in the last 168 hours.  BNPNo results for input(s): BNP, PROBNP in the last 168 hours.  DDimer No results for input(s): DDIMER in the last 168 hours.  Radiology/Studies:  Dg Abd 1 View  Result Date: 06/12/2018 CLINICAL DATA:  Encounter for ileus EXAM: ABDOMEN - 1 VIEW COMPARISON:  CT 05/20/2018 FINDINGS: Nasogastric tube in the decompressed stomach. Paucity of small bowel gas. Moderate proximal colonic fecal material, decompressed distally. Extensive visceral arterial calcifications. Right femoral transvenous pacing lead to the RV. Left femoral hemodialysis catheter to the common iliac vein. Small caliber left femoral arterial catheter to the external iliac artery. Regional bones unremarkable. IMPRESSION: 1. Normal bowel gas pattern. 2. Support hardware placement as above. 3. Extensive visceral arterial calcifications. Electronically Signed   By: Corlis Leak M.D.   On: 06/12/2018 09:40   Dg Chest Portable 1 View  Result Date: 06/11/2018 CLINICAL DATA:  Check endotracheal tube placement EXAM: PORTABLE CHEST 1 VIEW COMPARISON:  06/10/2018 FINDINGS: Cardiac shadow is enlarged but stable. Endotracheal tube and nasogastric catheter is well as an esophageal probe are seen. Multiple vascular stents are noted bilaterally stable from the prior exams.  The lungs are well aerated with mild vascular congestion. Mild right basilar atelectasis is noted. IMPRESSION: Mild vascular congestion consistent with early CHF. Mild right basilar atelectasis is noted Tubes and lines as described. Electronically Signed   By: Alcide Clever M.D.   On: 06/11/2018 08:14    Assessment and Plan:   1. Transient CHB - the patient's conduction has returned. He is not a candidate for endovascular PPM insertion due to his lack of access. In addition, ESRD with HD a relative contra-indication. A leadless PPM is an option but would result in PPM syndrome due to dysynchronous pacing in the VVI mode when he is in NSR and he will do badly with this. Early next year we expect to have released a leadless PM which senses impedence changes with atrial contraction which would be a surrogate VDD PM and he would be a candidate for this assuming his right femoral vein is not occluded. While he is back conducting 1:1, I agree that he  would almost certainly develop CHB with TAVR as he has RBBB and his LBBB as well as distal AV node/His bundle almost certain to get damaged with deployment of the valve. My only suggestion would be to consider epicardial pacing via thoracotomy or subxyphoid approach. This is also fairly high risk but I think at this time his only option.  Gualberto Wahlen,M.D.      For questions or updates, please contact [facility] Please consult [email or web address] for contact info under     Signed, [name], MD  06/14/2018 11:23 AM

## 2018-06-14 NOTE — Progress Notes (Signed)
Progress Note  Patient Name: [name] Date of Encounter: 06/14/2018  Primary Cardiologist: Thurmon FairMihai Rawlin Reaume, MD   Subjective   Alert, oriented, coherent, feeling weak. NSR with 1:1 AV conduction. RBBB. Has not required backup V pacing over the weekend.  Inpatient Medications    Scheduled Meds: . aspirin  81 mg Oral Daily  . Chlorhexidine Gluconate Cloth  6 each Topical Q0600  . clopidogrel  75 mg Oral Daily  . darbepoetin (ARANESP) injection - NON-DIALYSIS  150 mcg Subcutaneous Q Fri  . darunavir-cobicistat  1 tablet Oral Q breakfast  . dolutegravir  50 mg Oral Q supper  . gabapentin  100 mg Oral Daily  . hydrocortisone sodium succinate  50 mg Intravenous Q6H  . insulin aspart  0-20 Units Subcutaneous Q4H  . mouth rinse  15 mL Mouth Rinse BID  . multivitamin  1 tablet Oral QHS  . rosuvastatin  10 mg Oral q1800  . sodium chloride flush  3 mL Intravenous Q12H  . tenofovir  300 mg Oral Weekly   Continuous Infusions: . sodium chloride 10 mL/hr at 06/14/18 0700  . cefTRIAXone (ROCEPHIN)  IV Stopped (06/13/18 1555)  . dexmedetomidine (PRECEDEX) IV infusion Stopped (06/12/18 1003)  . famotidine (PEPCID) IV Stopped (06/13/18 2124)  . feeding supplement (VITAL AF 1.2 CAL) Stopped (06/11/18 0940)  . ferric gluconate (FERRLECIT/NULECIT) IV Stopped (06/05/18 1531)  . norepinephrine (LEVOPHED) Adult infusion 5 mcg/min (06/14/18 0700)  . dialysis replacement fluid (prismasate) 800 mL/hr at 06/14/18 0357  . dialysis replacement fluid (prismasate) 700 mL/hr at 06/14/18 0344  . dialysate (PRISMASATE) 2,000 mL/hr at 06/14/18 0616  . sodium chloride    . vasopressin (PITRESSIN) infusion - *FOR SHOCK* Stopped (06/12/18 0723)   PRN Meds: sodium chloride, acetaminophen, alteplase, fentaNYL (SUBLIMAZE) injection, fentaNYL (SUBLIMAZE) injection, ipratropium-albuterol, midazolam, nitroGLYCERIN, ondansetron (ZOFRAN) IV, polyvinyl alcohol, sodium chloride, sodium chloride flush    Vital Signs    Vitals:   06/14/18 0500 06/14/18 0600 06/14/18 0700 06/14/18 0815  BP: (!) 85/61 (!) 74/54 (!) 89/64   Pulse: 83 79 81   Resp:      Temp:    97.9 F (36.6 C)  TempSrc:    Oral  SpO2: 100% 100% 100%   Weight: 63.3 kg     Height:        Intake/Output Summary (Last 24 hours) at 06/14/2018 0841 Last data filed at 06/14/2018 0700 Gross per 24 hour  Intake 709.81 ml  Output 747 ml  Net -37.19 ml   Filed Weights   06/12/18 0300 06/13/18 0500 06/14/18 0500  Weight: 64.6 kg 63.1 kg 63.3 kg    Telemetry    NSR with PVCs - Personally Reviewed  ECG    NSR, RBBB, borderline criteria for LPFB. - Personally Reviewed  Physical Exam  Appears weak, a little depressed GEN: No acute distress.   Neck: No JVD Cardiac: RRR, 3/6 systolic murmur is mid peaking, 2/6 diastolic murmurs, rubs, or gallops. Occluded fistula R arm, patent AV fistula with good thrill/bruit on L upper arm. Respiratory: Clear to auscultation bilaterally. GI: Soft, nontender, non-distended  MS: No edema; No deformity. Neuro:  Nonfocal  Psych: looks "down"; was always joking before the episode of cardiac arrest.  Labs    Chemistry Recent Labs  Lab 06/13/18 0513 06/13/18 1525 06/14/18 0349  NA 138 139 138  K 4.1 4.1 3.6  CL 104 102 104  CO2 23 24 25   GLUCOSE 84 104* 90  BUN 16 16 11  CREATININE 1.34* 1.44* 1.38*  CALCIUM 8.5* 8.4* 8.2*  ALBUMIN 2.2* 2.1* 2.0*  GFRNONAA >60 55* 58*  GFRAA >60 >60 >60  ANIONGAP 11 13 9     Hematology Recent Labs  Lab 06/12/18 0419 06/13/18 0826 06/14/18 0345  WBC 16.1* 11.0* 9.7  RBC 2.71* 2.62* 2.47*  HGB 9.3* 9.1* 8.3*  HCT 28.9* 28.3* 26.6*  MCV 106.6* 108.0* 107.7*  MCH 34.3* 34.7* 33.6  MCHC 32.2 32.2 31.2  RDW 17.8* 17.8* 17.7*  PLT 33* 34* 44*   Cardiac Enzymes Recent Labs  Lab 06/25/2018 1210 06/18/2018 1805  TROPONINI 8.48* 7.29*   Radiology    Dg Abd 1 View  Result Date: 06/12/2018 CLINICAL DATA:  Encounter for ileus EXAM:  ABDOMEN - 1 VIEW COMPARISON:  CT 05/01/2018 FINDINGS: Nasogastric tube in the decompressed stomach. Paucity of small bowel gas. Moderate proximal colonic fecal material, decompressed distally. Extensive visceral arterial calcifications. Right femoral transvenous pacing lead to the RV. Left femoral hemodialysis catheter to the common iliac vein. Small caliber left femoral arterial catheter to the external iliac artery. Regional bones unremarkable. IMPRESSION: 1. Normal bowel gas pattern. 2. Support hardware placement as above. 3. Extensive visceral arterial calcifications. Electronically Signed   By: Corlis Leak M.D.   On: 06/12/2018 09:40    Cardiac Studies   PCI 06/05/2018  Prox LAD lesion is 99% stenosed.  Post intervention, there is a 0% residual stenosis.  A drug-eluting stent was successfully placed.  1. Severe, heavily calcified stenosis in the mid LAD 2. Successful PTCA of the mid LAD with orbital atherectomy and stenting of the mid LAD.   Recommendations: Will continue DAPT with ASA and Plavix. Will continue planning for TAVR.  Recommend uninterrupted dual antiplatelet therapy with Aspirin 81mg  daily and Clopidogrel 75mg  dailyfor a minimum of 12 months (ACS - Class I recommendation)  TEE 05/26/2018 - Left ventricle: The cavity size was moderately dilated. Systolic   function was severely reduced. The estimated ejection fraction was in the range of 15% to 20%. Wall motion was normal; there were no regional wall motion abnormalities. - Aortic valve: Trileaflet; severely thickened, moderately calcified leaflets; fusion of the left-noncoronary commissure. Cusp separation was reduced. Valve mobility was restricted. There was moderate stenosis. There was moderate to severe regurgitation. Peak velocity (S): 275 cm/s. Mean gradient (S): 19 mm Hg. Valve area (VTI): 1.2 cm^2. Valve area (Vmax): 1.58 cm^2. Valve area (Vmean): 1.5 cm^2. Valve area by planimetry 0.79 cm^2. - Mitral valve: There was  mild regurgitation. - Left atrium: No evidence of thrombus in the atrial cavity or appendage. No evidence of thrombus in the atrial cavity or appendage. - Right ventricle: The cavity size was normal. Wall thickness was normal. Systolic function was severely reduced. - Right atrium: No evidence of thrombus in the atrial cavity or   appendage.  Impressions:  - Aortic stenosis is severe by planimetry but moderate by mean gradient and peak velocity. Given the reduced systolic function and severely calcified, consider low-flow, low-gradient severe aortic stenosis.  Patient Profile    52 y.o. 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 Extubated on 9/14.  Assessment & Plan    1. CHB:  Has varying forms of IVCD (LPFB, RBBB) that come and go, suggesting that he  has true conduction system disease and that the events were not solely due to metabolic or pharmacological effects.  He did miss a dialysis session on September 7, the day before his arrest.  Magnesium and phosphorus levels were slightly out of range at the time of his events on the night of September 8, but unlikely enough to cause complete heart block.  The only negative inotropic drugs he was receiving was metoprolol succinate 25 mg once daily.  High likelihood that he would develop complete heart block with TAVR.  Do not think we can proceed with TAVR plans unless he has a pacemaker.  Conversely, transvenous permanent pacemaker implantation in this patient on hemodialysis comes with substantially increased risk of complications as well.  We will ask for EP consultation. 2. 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  Was in the process of TAVR work-up, interrupted by his episode of complete heart block. 3. CAD: s/p PCI-DES LAD 06/04/2018, with plans for dual antiplatelet therapy with aspirin and clopidogrel for 12 months. 4. CHF: Severely depressed left ventricular systolic function, presumed due to low gradient severe aortic stenosis plus/minus CAD.  Medical therapy for heart failure limited by hypotension. 5. ESRD: He has successfully survived through 19 years of hemodialysis.  Increased risk of bleeding infection if a fresh transvenous pacemaker is placed. 6. HIV: Compliant with meds, controlled, undetectable viral load 05/31/2018.     For questions or updates, please contact [facility] Please consult [email or web address] for contact info under        Signed, Thurmon Fair, MD  06/14/2018, 8:41 AM

## 2018-06-14 NOTE — Progress Notes (Signed)
  [facility]   MULTIDISCIPLINARY HEART VALVE TEAM   I talked with patient and wife today about potential TAVR, which was put on hold given multiple CHB related and PEA arrests and intubation. He was extubated 9/14 but still hypotensive requiring pressors. Transcutaneous pacer still in place but has not been pacing since last week. Platelets down to 36 but now improving up to 44. Sputum cultures showed Klebsiella and B strep, being treated with antibiotics  He is up talking and only complaint is that he cannot sleep. Dr. Royann Shiversroitoru has seen him today and feels that we cannot proceed with TAVR unless he has a pacemaker. EP has been consulted for pacemaker candidacy. History of ESRD/fistulas makes permanent transvenous pacing complex. We will wait for recommendations from EP and discuss his case with the Multidisciplinary Valve Team at tomorrow mornings meeting.   Cline CrockKathryn Kurtiss Wence PA-C  [facility]

## 2018-06-14 NOTE — Progress Notes (Signed)
Subjective: Interval History: system clotted 2x in last 24hrs, off now. plts low 44k. Pt w/o c/o except not able to sleep.    Objective: Vital signs in last 24 hours: Temp:  [97.5 F (36.4 C)-98.3 F (36.8 C)] 97.9 F (36.6 C) (09/16 0815) Pulse Rate:  [76-88] 88 (09/16 1000) BP: (74-92)/(48-64) 86/58 (09/16 1000) SpO2:  [87 %-100 %] 100 % (09/16 1000) Weight:  [63.3 kg] 63.3 kg (09/16 0500) Weight change: 0.2 kg  Intake/Output from previous day: 09/15 0701 - 09/16 0700 In: 709.8 [P.O.:30; I.V.:269.8; IV Piggyback:410] Out: 837  Intake/Output this shift: Total I/O In: 41.9 [I.V.:41.9] Out: -   General appearance: cooperative, no distress and slowed mentation Resp: rales bibasilar and rhonchi bibasilar Cardio: S1, S2 normal and systolic murmur: systolic ejection 3/6, crescendo and decrescendo at 2nd left intercostal space GI: liver down 6 cm , pos bos, soft Extremities: AVF LUA, Hd cath R groin, no LE edema or UE edema TV pacer in groin  CXR 9/13 - clear  TTS [facility] 3.5h 66kg 2K/2.5Ca P4 LUE AVG Hep none -Hectorol 3 mcg IV TIW -Venofer 50mg  IV q weeks  -Mircera 100mcg IV q 2 weeks last 8/20  -Ca acetate 4 tid qac--> stopped while hospitalized in light of hyperCa  Assessment/Plan: 1 ESRD CRRT going well. Vol better, K/acid/base/ ok, keep + 50cc/ hr, try to get pressors off, NS bolus prn, under dry. Resume this am w/ citrate assist 2 CAD per cards 3 AV dz per cards 4 Heart block - TV pacer in but not requiring use 5 Anemia check Fe 6 HPTH check 7 HIV 8 Fevers stable  P CRRT, citrate, +50/hr   LOS: 24 days   Caleb HairRobert D Aniah [name] 06/14/2018,10:56 AM  Lab Results: Recent Labs    06/13/18 0826 06/14/18 0345  WBC 11.0* 9.7  HGB 9.1* 8.3*  HCT 28.3* 26.6*  PLT 34* 44*   BMET:  Recent Labs    06/13/18 1525 06/14/18 0349  NA 139 138  K 4.1 3.6  CL 102 104  CO2 24 25  GLUCOSE 104* 90  BUN 16 11  CREATININE 1.44* 1.38*  CALCIUM 8.4* 8.2*   No  results for input(s): PTH in the last 72 hours. Iron Studies:  Recent Labs    06/13/18 0826  IRON 38*  TIBC 210*    Studies/Results: No results found.

## 2018-06-14 NOTE — Progress Notes (Signed)
I was on the 2H Unit and the patient's wife saw me coming down the hall and motioned for me to stop by the room. The patient was awake and talking. The patient's wife shared how the patient was doing much better today. I talked with the patient and his wife and lead them in prayer.  The patient requested a Bible. I shared that I would bring one as soon as I was able.    06/14/18 0955  Clinical Encounter Type  Visited With Patient and family together  Visit Type Follow-up;Spiritual support  Referral From Nurse  Consult/Referral To Chaplain  Spiritual Encounters  Spiritual Needs Prayer  Stress Factors  Patient Stress Factors None identified  Family Stress Factors None identified    Chaplain Dr Melvyn NovasMichael Myla Mauriello

## 2018-06-14 NOTE — Plan of Care (Signed)
  Problem: Clinical Measurements: Goal: Diagnostic test results will improve Outcome: Progressing Goal: Respiratory complications will improve Outcome: Progressing   Problem: Education: Goal: Understanding of CV disease, CV risk reduction, and recovery process will improve Outcome: Progressing

## 2018-06-14 NOTE — Progress Notes (Signed)
Social Note:   We appreciate the great care provided by CCM. Family medicine will continue to follow along.   Peggyann ShoalsHannah Anderson, DO [facility], PGY-1 06/14/2018 6:59 AM

## 2018-06-14 NOTE — Progress Notes (Signed)
I delivered the Bible the patient requested and provided spiritual support to the patient and his wife by leading in prayer. I shared that the Chaplain is available for additional support as needed or requested.      06/14/18 1400  Clinical Encounter Type  Visited With Patient and family together  Visit Type Follow-up;Spiritual support  Referral From Nurse  Consult/Referral To Chaplain  Spiritual Encounters  Spiritual Needs Sacred text;Prayer  Stress Factors  Patient Stress Factors None identified  Family Stress Factors None identified    Chaplain Dr Melvyn NovasMichael Jayan Raymundo

## 2018-06-14 NOTE — Progress Notes (Addendum)
PULMONARY / CRITICAL CARE MEDICINE   NAME:  [name], MRN:  [id number], DOB:  Aug 03, 1966, LOS: 24 ADMISSION DATE:  28-May-2018, CONSULTATION DATE:  05/30/2018 REFERRING MD:  [name], CHIEF COMPLAINT:  Cardiac arrest  BRIEF HISTORY:    This is a 52 y.o.malewith hx severe CAD, AS, sCHF (echo from Aug 2019 with EF 15-20%), HIV on HAART, ESRD on HD (for almost 20 years). Initially admitted 8/23 with NSTEMI. On 8/26, had cath that showed multivessel disease; therefore, on 9/3, had PCI to LAD and was also being worked up for a TAVR that he was scheduled to receive 06/21/2018. On 9/9 had brief PEA arrest with CPR thought r/t 3rd degree block. Had recurrent arrest later that night requiring transcutaneous pacing. He remained conscious following both episodes but had progressive respiratory failure r/t pulmonary edema requiring intubation.  Went for temp Temp Pacemaker on 9/9, returned to ICU CRRT started. Requiring high PEEP/FIO2 as well as high dose vasopressors for cardiogenic shock.  9/10: still positive volume status. WBCs remain up. FiO2 and PEEP are improving.  9/12 volume removal with CVVHD limited by hypotension/pressor needs. Plts trending down->36 9/13: PEEP, FiO2, pressor requirements all improved. Sputum cultures demonstrating Klebsiella and B strep, antibiotics changed to ceftriaxone monotherapy.  9/14: Successfully extubated.  9/15 tolerating intermittent dialysis, on low-dose pressors more awake and alert 9/16: Awake, alert no distress weaning pressors still  STUDIES:   No recent CULTURES:  9/10: Sputum culture: Few group B strep, few Klebsiella pneumoniae  ANTIBIOTICS:  HAART Vanc 9/10>>> 9/13 Cefepime 9/10>>> 9/13 Ceftriaxone 9/13:  Procedures::  R/LHC 8/26 > 1st  diag lesion 100% stenosed, prox LAD to mid LAD 95% stenosed, 2nd diag lesion 99% stenosed, 1st Mrg lesion 90% stenosed, prox RCA to mid RCA 20% stenosed, mid RCA lesion 20% stenosed. 9/9 intubated>>>  9/14  CONSULTANTS:  Cardiology 8/23 >  Nephrology 8/24 >  CVTS 8/27 >   SUBJECTIVE:  Feels better.  No chest pain.  Reports he is having difficulty sleeping  CONSTITUTIONAL: Blood Pressure (Abnormal) 86/58   Pulse 88   Temperature 97.9 F (36.6 C) (Oral)   Respiration (Abnormal) 22   Height 5\' 11"  (1.803 m)   Weight 63.3 kg   Oxygen Saturation 100%   Body Mass Index 19.46 kg/m   I/O last 3 completed shifts: In: 940.2 [P.O.:30; I.V.:450.3; IV Piggyback:459.9] Out: 1886 [Other:1886]  CVP:  [12 mmHg] 12 mmHg     PHYSICAL EXAM: General: 52 year old African-American male currently resting in bed he is in no acute distress   HEENT normocephalic atraumatic no jugular venous distention Pulmonary: Clear to auscultation, does have intermittent rhonchus cough that clears Cardiac: Regular rate and rhythm does have systolic murmur Abdomen: Soft nontender no organomegaly positive bowel sounds GU: An uric Extremities: Warm dry brisk cap refill Neuro: Intact  RESOLVED PROBLEM LIST  Acute respiratory failure Cardiac arrest Ileus Complete heart block  ASSESSMENT AND PLAN   Status post PEA cardiac arrest secondary to complete heart block -Has not required pacing for over 48 hours -Cardiology voicing significant concern about risk of complete heart block in the future specifically during TAVR Plan Hold rate reducing medications Continue telemetry Electrophysiology consulted, likely needs permanent pacemaker however in setting of end-stage renal disease this will be challenging given concern about loss of potential HD site in the future  Non-ST elevation MI, status post PCI and arthrectomy from mid LAD on 9/3 Plan Continuing aspirin, statin, and Plavix  Heart failure with reduced ejection fraction  EF 25 to 30%, further complicated by severe aortic stenosis Cardiogenic shock -Needs TAVR;  not a candidate for surgical replacement -Pressor requirements improved Plan EP  consulted Re: Possible pacemaker We will add midodrine Mean arterial pressure greater than 50 Continue telemetry monitoring Stress dose steroids  Pneumonia.  Favor aspiration; sputum showing both Klebsiella pneumoniae and B strep -Most recent chest x-ray on 9/13: Personally reviews demonstrates improved aeration Plan We will complete total of 8 days antibiotics, which should be on the 17th Continue to wean oxygen Mobilize when able Add flutter valve Repeat chest x-ray follow-up pneumonia  End-stage renal disease Plan Intermittent dialysis per nephrology  Intermittent fluid and electrolyte imbalance: Hypophosphatemia Plan Trend chemistry replace as indicated  Mild anemia without evidence of bleeding Plan Trend CBC Transfuse as indicated  Thrombocytopenia -Platelet count is improving Plan No heparin Trend CBC  HIV Plan Continue ART therapy  Hyper and hypoglycemia.  This seems to have improved Plan Stopping sliding scale insulin Discontinue glucose monitoring if stable for the next 24 hours.   SUMMARY OF TODAY'S PLAN:  Looks better.  Hemodynamically improving.  For today we will add midodrine, follow-up chest x-ray, continue antibiotics.  Would like to mobilize him but will be limited by his current dialysis catheter and pacemaker.  Awaiting cardiology and nephrology feedback in regards to permanent pacemaker placement.  Hopefully we now him off pressors in the next 24 hours  Best Practice / Goals of Care / Disposition.   DVT PROPHYLAXIS:SCD NUTRITION:Advance diet MOBILITY:BR, can advance once temporary pacer removed FAMILY DISCUSSIONS: Discussed with wife at bedside   LABS  Glucose Recent Labs  Lab 06/13/18 0745 06/13/18 1159 06/13/18 2114 06/13/18 2357 06/14/18 0347 06/14/18 0736  GLUCAP 50* 72 71 70 84 88    BMET Recent Labs  Lab 06/13/18 0513 06/13/18 1525 06/14/18 0349  NA 138 139 138  K 4.1 4.1 3.6  CL 104 102 104  CO2 23 24 25   BUN 16  16 11   CREATININE 1.34* 1.44* 1.38*  GLUCOSE 84 104* 90    Liver Enzymes Recent Labs  Lab 06/13/18 0513 06/13/18 1525 06/14/18 0349  ALBUMIN 2.2* 2.1* 2.0*    Electrolytes Recent Labs  Lab 06/12/18 0419  06/13/18 0513 06/13/18 1525 06/14/18 0349  CALCIUM  --    < > 8.5* 8.4* 8.2*  MG 2.6*  --  2.6*  --  2.6*  PHOS  --    < > 2.4* 3.7 1.3*   < > = values in this interval not displayed.    CBC Recent Labs  Lab 06/12/18 0419 06/13/18 0826 06/14/18 0345  WBC 16.1* 11.0* 9.7  HGB 9.3* 9.1* 8.3*  HCT 28.9* 28.3* 26.6*  PLT 33* 34* 44*    ABG Recent Labs  Lab 2017-11-18 0842 06/09/18 0415 06/10/18 0416  PHART 7.357 7.430 7.503*  PCO2ART 44.4 32.5 32.9  PO2ART 391.0* 213* 244.0*    Coag's Recent Labs  Lab 2017-11-18 0347 06/09/18 0414 06/10/18 0415  APTT 91* 84* 52*    Sepsis Markers Recent Labs  Lab 06/16/2018 1210 06/19/2018 1458  LATICACIDVEN 3.7* 2.5*    Cardiac Enzymes Recent Labs  Lab 06/12/2018 1210 06/05/2018 1805  TROPONINI 8.48* 7.29*   Simonne MartinetPeter E Charmagne Buhl ACNP-BC [facility] Pager # [phone] OR # [phone] if no answer

## 2018-06-15 ENCOUNTER — Inpatient Hospital Stay (HOSPITAL_COMMUNITY): Payer: Medicare Other

## 2018-06-15 DIAGNOSIS — I350 Nonrheumatic aortic (valve) stenosis: Secondary | ICD-10-CM

## 2018-06-15 DIAGNOSIS — E440 Moderate protein-calorie malnutrition: Secondary | ICD-10-CM

## 2018-06-15 LAB — RENAL FUNCTION PANEL
ANION GAP: 18 — AB (ref 5–15)
ANION GAP: 15 (ref 5–15)
Albumin: 2 g/dL — ABNORMAL LOW (ref 3.5–5.0)
Albumin: 1.9 g/dL — ABNORMAL LOW (ref 3.5–5.0)
BUN: 12 mg/dL (ref 6–20)
BUN: 9 mg/dL (ref 6–20)
CHLORIDE: 92 mmol/L — AB (ref 98–111)
CHLORIDE: 91 mmol/L — AB (ref 98–111)
CO2: 29 mmol/L (ref 22–32)
CO2: 35 mmol/L — ABNORMAL HIGH (ref 22–32)
CREATININE: 1.8 mg/dL — AB (ref 0.61–1.24)
Calcium: 9.5 mg/dL (ref 8.9–10.3)
Calcium: 9.6 mg/dL (ref 8.9–10.3)
Creatinine, Ser: 1.86 mg/dL — ABNORMAL HIGH (ref 0.61–1.24)
GFR calc non Af Amer: 41 mL/min — ABNORMAL LOW (ref >60)
GFR, EST AFRICAN AMERICAN: 47 mL/min — AB (ref >60)
GFR, EST AFRICAN AMERICAN: 49 mL/min — AB (ref >60)
GFR, EST NON AFRICAN AMERICAN: 42 mL/min — AB (ref >60)
Glucose, Bld: 157 mg/dL — ABNORMAL HIGH (ref 70–99)
Glucose, Bld: 124 mg/dL — ABNORMAL HIGH (ref 70–99)
POTASSIUM: 3.4 mmol/L — AB (ref 3.5–5.1)
Phosphorus: 2.6 mg/dL (ref 2.5–4.6)
Phosphorus: 2.2 mg/dL — ABNORMAL LOW (ref 2.5–4.6)
Potassium: 3.5 mmol/L (ref 3.5–5.1)
Sodium: 139 mmol/L (ref 135–145)
Sodium: 141 mmol/L (ref 135–145)

## 2018-06-15 LAB — POCT I-STAT, CHEM 8
BUN: 10 mg/dL (ref 6–20)
BUN: 10 mg/dL (ref 6–20)
BUN: 10 mg/dL (ref 6–20)
BUN: 11 mg/dL (ref 6–20)
BUN: 11 mg/dL (ref 6–20)
BUN: 11 mg/dL (ref 6–20)
BUN: 11 mg/dL (ref 6–20)
BUN: 12 mg/dL (ref 6–20)
BUN: 12 mg/dL (ref 6–20)
BUN: 12 mg/dL (ref 6–20)
BUN: 12 mg/dL (ref 6–20)
BUN: 12 mg/dL (ref 6–20)
BUN: 13 mg/dL (ref 6–20)
BUN: 13 mg/dL (ref 6–20)
BUN: 13 mg/dL (ref 6–20)
BUN: 14 mg/dL (ref 6–20)
BUN: 6 mg/dL (ref 6–20)
BUN: 7 mg/dL (ref 6–20)
BUN: 7 mg/dL (ref 6–20)
BUN: 7 mg/dL (ref 6–20)
BUN: 7 mg/dL (ref 6–20)
BUN: 7 mg/dL (ref 6–20)
BUN: 8 mg/dL (ref 6–20)
BUN: 8 mg/dL (ref 6–20)
BUN: 8 mg/dL (ref 6–20)
BUN: 9 mg/dL (ref 6–20)
BUN: 9 mg/dL (ref 6–20)
BUN: 9 mg/dL (ref 6–20)
BUN: 9 mg/dL (ref 6–20)
CALCIUM ION: 0.96 mmol/L — AB (ref 1.15–1.40)
CALCIUM ION: 0.38 mmol/L — AB (ref 1.15–1.40)
CALCIUM ION: 0.99 mmol/L — AB (ref 1.15–1.40)
CALCIUM ION: 0.4 mmol/L — AB (ref 1.15–1.40)
CALCIUM ION: 0.99 mmol/L — AB (ref 1.15–1.40)
CALCIUM ION: 0.92 mmol/L — AB (ref 1.15–1.40)
CHLORIDE: 88 mmol/L — AB (ref 98–111)
CHLORIDE: 88 mmol/L — AB (ref 98–111)
CHLORIDE: 88 mmol/L — AB (ref 98–111)
CHLORIDE: 88 mmol/L — AB (ref 98–111)
CHLORIDE: 89 mmol/L — AB (ref 98–111)
CHLORIDE: 90 mmol/L — AB (ref 98–111)
CHLORIDE: 90 mmol/L — AB (ref 98–111)
CHLORIDE: 90 mmol/L — AB (ref 98–111)
CHLORIDE: 90 mmol/L — AB (ref 98–111)
CHLORIDE: 92 mmol/L — AB (ref 98–111)
CHLORIDE: 92 mmol/L — AB (ref 98–111)
CHLORIDE: 92 mmol/L — AB (ref 98–111)
CHLORIDE: 92 mmol/L — AB (ref 98–111)
CHLORIDE: 93 mmol/L — AB (ref 98–111)
CHLORIDE: 93 mmol/L — AB (ref 98–111)
CHLORIDE: 96 mmol/L — AB (ref 98–111)
CHLORIDE: 96 mmol/L — AB (ref 98–111)
CHLORIDE: 96 mmol/L — AB (ref 98–111)
CHLORIDE: 97 mmol/L — AB (ref 98–111)
CHLORIDE: 98 mmol/L (ref 98–111)
CREATININE: 1.1 mg/dL (ref 0.61–1.24)
CREATININE: 1.1 mg/dL (ref 0.61–1.24)
CREATININE: 1.2 mg/dL (ref 0.61–1.24)
CREATININE: 1.5 mg/dL — AB (ref 0.61–1.24)
CREATININE: 1.5 mg/dL — AB (ref 0.61–1.24)
CREATININE: 1.6 mg/dL — AB (ref 0.61–1.24)
CREATININE: 1.6 mg/dL — AB (ref 0.61–1.24)
CREATININE: 1.6 mg/dL — AB (ref 0.61–1.24)
CREATININE: 1.6 mg/dL — AB (ref 0.61–1.24)
CREATININE: 1.7 mg/dL — AB (ref 0.61–1.24)
CREATININE: 1.8 mg/dL — AB (ref 0.61–1.24)
Calcium, Ion: 0.44 mmol/L — CL (ref 1.15–1.40)
Calcium, Ion: 0.45 mmol/L — CL (ref 1.15–1.40)
Calcium, Ion: 0.52 mmol/L — CL (ref 1.15–1.40)
Calcium, Ion: 0.53 mmol/L — CL (ref 1.15–1.40)
Calcium, Ion: 0.91 mmol/L — ABNORMAL LOW (ref 1.15–1.40)
Calcium, Ion: 0.98 mmol/L — ABNORMAL LOW (ref 1.15–1.40)
Calcium, Ion: 0.99 mmol/L — ABNORMAL LOW (ref 1.15–1.40)
Calcium, Ion: 1 mmol/L — ABNORMAL LOW (ref 1.15–1.40)
Calcium, Ion: 1 mmol/L — ABNORMAL LOW (ref 1.15–1.40)
Calcium, Ion: 0.47 mmol/L — CL (ref 1.15–1.40)
Calcium, Ion: 0.48 mmol/L — CL (ref 1.15–1.40)
Calcium, Ion: 0.98 mmol/L — ABNORMAL LOW (ref 1.15–1.40)
Calcium, Ion: 1 mmol/L — ABNORMAL LOW (ref 1.15–1.40)
Calcium, Ion: 0.42 mmol/L — CL (ref 1.15–1.40)
Calcium, Ion: 0.39 mmol/L — CL (ref 1.15–1.40)
Calcium, Ion: 0.99 mmol/L — ABNORMAL LOW (ref 1.15–1.40)
Calcium, Ion: 0.43 mmol/L — CL (ref 1.15–1.40)
Calcium, Ion: 1.02 mmol/L — ABNORMAL LOW (ref 1.15–1.40)
Calcium, Ion: 0.37 mmol/L — CL (ref 1.15–1.40)
Calcium, Ion: 0.98 mmol/L — ABNORMAL LOW (ref 1.15–1.40)
Calcium, Ion: 0.41 mmol/L — CL (ref 1.15–1.40)
Calcium, Ion: 0.35 mmol/L — CL (ref 1.15–1.40)
Calcium, Ion: 0.91 mmol/L — ABNORMAL LOW (ref 1.15–1.40)
Chloride: 97 mmol/L — ABNORMAL LOW (ref 98–111)
Chloride: 97 mmol/L — ABNORMAL LOW (ref 98–111)
Chloride: 92 mmol/L — ABNORMAL LOW (ref 98–111)
Chloride: 91 mmol/L — ABNORMAL LOW (ref 98–111)
Chloride: 93 mmol/L — ABNORMAL LOW (ref 98–111)
Chloride: 89 mmol/L — ABNORMAL LOW (ref 98–111)
Chloride: 89 mmol/L — ABNORMAL LOW (ref 98–111)
Chloride: 88 mmol/L — ABNORMAL LOW (ref 98–111)
Chloride: 91 mmol/L — ABNORMAL LOW (ref 98–111)
Creatinine, Ser: 1.1 mg/dL (ref 0.61–1.24)
Creatinine, Ser: 1.2 mg/dL (ref 0.61–1.24)
Creatinine, Ser: 1.4 mg/dL — ABNORMAL HIGH (ref 0.61–1.24)
Creatinine, Ser: 1.7 mg/dL — ABNORMAL HIGH (ref 0.61–1.24)
Creatinine, Ser: 1.7 mg/dL — ABNORMAL HIGH (ref 0.61–1.24)
Creatinine, Ser: 1.8 mg/dL — ABNORMAL HIGH (ref 0.61–1.24)
Creatinine, Ser: 1.2 mg/dL (ref 0.61–1.24)
Creatinine, Ser: 1.5 mg/dL — ABNORMAL HIGH (ref 0.61–1.24)
Creatinine, Ser: 1.7 mg/dL — ABNORMAL HIGH (ref 0.61–1.24)
Creatinine, Ser: 1.1 mg/dL (ref 0.61–1.24)
Creatinine, Ser: 1.5 mg/dL — ABNORMAL HIGH (ref 0.61–1.24)
Creatinine, Ser: 1 mg/dL (ref 0.61–1.24)
Creatinine, Ser: 1.1 mg/dL (ref 0.61–1.24)
Creatinine, Ser: 1.5 mg/dL — ABNORMAL HIGH (ref 0.61–1.24)
Creatinine, Ser: 1 mg/dL (ref 0.61–1.24)
Creatinine, Ser: 1.1 mg/dL (ref 0.61–1.24)
Creatinine, Ser: 1.3 mg/dL — ABNORMAL HIGH (ref 0.61–1.24)
Creatinine, Ser: 1 mg/dL (ref 0.61–1.24)
GLUCOSE: 131 mg/dL — AB (ref 70–99)
GLUCOSE: 148 mg/dL — AB (ref 70–99)
GLUCOSE: 215 mg/dL — AB (ref 70–99)
GLUCOSE: 198 mg/dL — AB (ref 70–99)
GLUCOSE: 250 mg/dL — AB (ref 70–99)
GLUCOSE: 192 mg/dL — AB (ref 70–99)
GLUCOSE: 169 mg/dL — AB (ref 70–99)
GLUCOSE: 236 mg/dL — AB (ref 70–99)
GLUCOSE: 205 mg/dL — AB (ref 70–99)
Glucose, Bld: 140 mg/dL — ABNORMAL HIGH (ref 70–99)
Glucose, Bld: 142 mg/dL — ABNORMAL HIGH (ref 70–99)
Glucose, Bld: 166 mg/dL — ABNORMAL HIGH (ref 70–99)
Glucose, Bld: 182 mg/dL — ABNORMAL HIGH (ref 70–99)
Glucose, Bld: 196 mg/dL — ABNORMAL HIGH (ref 70–99)
Glucose, Bld: 196 mg/dL — ABNORMAL HIGH (ref 70–99)
Glucose, Bld: 198 mg/dL — ABNORMAL HIGH (ref 70–99)
Glucose, Bld: 155 mg/dL — ABNORMAL HIGH (ref 70–99)
Glucose, Bld: 156 mg/dL — ABNORMAL HIGH (ref 70–99)
Glucose, Bld: 177 mg/dL — ABNORMAL HIGH (ref 70–99)
Glucose, Bld: 202 mg/dL — ABNORMAL HIGH (ref 70–99)
Glucose, Bld: 161 mg/dL — ABNORMAL HIGH (ref 70–99)
Glucose, Bld: 147 mg/dL — ABNORMAL HIGH (ref 70–99)
Glucose, Bld: 213 mg/dL — ABNORMAL HIGH (ref 70–99)
Glucose, Bld: 141 mg/dL — ABNORMAL HIGH (ref 70–99)
Glucose, Bld: 204 mg/dL — ABNORMAL HIGH (ref 70–99)
Glucose, Bld: 123 mg/dL — ABNORMAL HIGH (ref 70–99)
Glucose, Bld: 131 mg/dL — ABNORMAL HIGH (ref 70–99)
Glucose, Bld: 129 mg/dL — ABNORMAL HIGH (ref 70–99)
Glucose, Bld: 210 mg/dL — ABNORMAL HIGH (ref 70–99)
HCT: 23 % — ABNORMAL LOW (ref 39.0–52.0)
HCT: 24 % — ABNORMAL LOW (ref 39.0–52.0)
HCT: 26 % — ABNORMAL LOW (ref 39.0–52.0)
HCT: 25 % — ABNORMAL LOW (ref 39.0–52.0)
HCT: 26 % — ABNORMAL LOW (ref 39.0–52.0)
HCT: 27 % — ABNORMAL LOW (ref 39.0–52.0)
HCT: 43 % (ref 39.0–52.0)
HCT: 25 % — ABNORMAL LOW (ref 39.0–52.0)
HCT: 24 % — ABNORMAL LOW (ref 39.0–52.0)
HCT: 25 % — ABNORMAL LOW (ref 39.0–52.0)
HCT: 25 % — ABNORMAL LOW (ref 39.0–52.0)
HCT: 25 % — ABNORMAL LOW (ref 39.0–52.0)
HCT: 23 % — ABNORMAL LOW (ref 39.0–52.0)
HCT: 25 % — ABNORMAL LOW (ref 39.0–52.0)
HCT: 27 % — ABNORMAL LOW (ref 39.0–52.0)
HEMATOCRIT: 24 % — AB (ref 39.0–52.0)
HEMATOCRIT: 24 % — AB (ref 39.0–52.0)
HEMATOCRIT: 25 % — AB (ref 39.0–52.0)
HEMATOCRIT: 25 % — AB (ref 39.0–52.0)
HEMATOCRIT: 26 % — AB (ref 39.0–52.0)
HEMATOCRIT: 26 % — AB (ref 39.0–52.0)
HEMATOCRIT: 27 % — AB (ref 39.0–52.0)
HEMATOCRIT: 27 % — AB (ref 39.0–52.0)
HEMATOCRIT: 27 % — AB (ref 39.0–52.0)
HEMATOCRIT: 27 % — AB (ref 39.0–52.0)
HEMATOCRIT: 27 % — AB (ref 39.0–52.0)
HEMATOCRIT: 27 % — AB (ref 39.0–52.0)
HEMATOCRIT: 31 % — AB (ref 39.0–52.0)
HEMATOCRIT: 31 % — AB (ref 39.0–52.0)
HEMOGLOBIN: 10.5 g/dL — AB (ref 13.0–17.0)
HEMOGLOBIN: 14.6 g/dL (ref 13.0–17.0)
HEMOGLOBIN: 8.2 g/dL — AB (ref 13.0–17.0)
HEMOGLOBIN: 8.2 g/dL — AB (ref 13.0–17.0)
HEMOGLOBIN: 8.5 g/dL — AB (ref 13.0–17.0)
HEMOGLOBIN: 8.8 g/dL — AB (ref 13.0–17.0)
HEMOGLOBIN: 8.8 g/dL — AB (ref 13.0–17.0)
HEMOGLOBIN: 8.8 g/dL — AB (ref 13.0–17.0)
HEMOGLOBIN: 9.2 g/dL — AB (ref 13.0–17.0)
Hemoglobin: 7.8 g/dL — ABNORMAL LOW (ref 13.0–17.0)
Hemoglobin: 8.2 g/dL — ABNORMAL LOW (ref 13.0–17.0)
Hemoglobin: 8.2 g/dL — ABNORMAL LOW (ref 13.0–17.0)
Hemoglobin: 9.2 g/dL — ABNORMAL LOW (ref 13.0–17.0)
Hemoglobin: 9.2 g/dL — ABNORMAL LOW (ref 13.0–17.0)
Hemoglobin: 9.2 g/dL — ABNORMAL LOW (ref 13.0–17.0)
Hemoglobin: 8.5 g/dL — ABNORMAL LOW (ref 13.0–17.0)
Hemoglobin: 8.8 g/dL — ABNORMAL LOW (ref 13.0–17.0)
Hemoglobin: 9.2 g/dL — ABNORMAL LOW (ref 13.0–17.0)
Hemoglobin: 8.5 g/dL — ABNORMAL LOW (ref 13.0–17.0)
Hemoglobin: 9.2 g/dL — ABNORMAL LOW (ref 13.0–17.0)
Hemoglobin: 8.5 g/dL — ABNORMAL LOW (ref 13.0–17.0)
Hemoglobin: 8.5 g/dL — ABNORMAL LOW (ref 13.0–17.0)
Hemoglobin: 8.5 g/dL — ABNORMAL LOW (ref 13.0–17.0)
Hemoglobin: 7.8 g/dL — ABNORMAL LOW (ref 13.0–17.0)
Hemoglobin: 8.5 g/dL — ABNORMAL LOW (ref 13.0–17.0)
Hemoglobin: 10.5 g/dL — ABNORMAL LOW (ref 13.0–17.0)
Hemoglobin: 9.2 g/dL — ABNORMAL LOW (ref 13.0–17.0)
Hemoglobin: 8.5 g/dL — ABNORMAL LOW (ref 13.0–17.0)
Hemoglobin: 9.2 g/dL — ABNORMAL LOW (ref 13.0–17.0)
POTASSIUM: 2.7 mmol/L — AB (ref 3.5–5.1)
POTASSIUM: 2.9 mmol/L — AB (ref 3.5–5.1)
POTASSIUM: 3.2 mmol/L — AB (ref 3.5–5.1)
POTASSIUM: 3.2 mmol/L — AB (ref 3.5–5.1)
POTASSIUM: 3.2 mmol/L — AB (ref 3.5–5.1)
POTASSIUM: 3.3 mmol/L — AB (ref 3.5–5.1)
POTASSIUM: 3.3 mmol/L — AB (ref 3.5–5.1)
POTASSIUM: 3.3 mmol/L — AB (ref 3.5–5.1)
POTASSIUM: 3.3 mmol/L — AB (ref 3.5–5.1)
POTASSIUM: 3.3 mmol/L — AB (ref 3.5–5.1)
POTASSIUM: 3.3 mmol/L — AB (ref 3.5–5.1)
POTASSIUM: 3.4 mmol/L — AB (ref 3.5–5.1)
POTASSIUM: 3.4 mmol/L — AB (ref 3.5–5.1)
POTASSIUM: 3.4 mmol/L — AB (ref 3.5–5.1)
POTASSIUM: 3.5 mmol/L (ref 3.5–5.1)
POTASSIUM: 3.5 mmol/L (ref 3.5–5.1)
POTASSIUM: 3.6 mmol/L (ref 3.5–5.1)
POTASSIUM: 3.6 mmol/L (ref 3.5–5.1)
POTASSIUM: 3.6 mmol/L (ref 3.5–5.1)
POTASSIUM: 3.7 mmol/L (ref 3.5–5.1)
POTASSIUM: 3.8 mmol/L (ref 3.5–5.1)
Potassium: 3.8 mmol/L (ref 3.5–5.1)
Potassium: 3.4 mmol/L — ABNORMAL LOW (ref 3.5–5.1)
Potassium: 3.5 mmol/L (ref 3.5–5.1)
Potassium: 3.2 mmol/L — ABNORMAL LOW (ref 3.5–5.1)
Potassium: 3.2 mmol/L — ABNORMAL LOW (ref 3.5–5.1)
Potassium: 3.3 mmol/L — ABNORMAL LOW (ref 3.5–5.1)
Potassium: 3.3 mmol/L — ABNORMAL LOW (ref 3.5–5.1)
Potassium: 3.4 mmol/L — ABNORMAL LOW (ref 3.5–5.1)
SODIUM: 139 mmol/L (ref 135–145)
SODIUM: 138 mmol/L (ref 135–145)
SODIUM: 138 mmol/L (ref 135–145)
SODIUM: 137 mmol/L (ref 135–145)
SODIUM: 139 mmol/L (ref 135–145)
SODIUM: 139 mmol/L (ref 135–145)
SODIUM: 140 mmol/L (ref 135–145)
SODIUM: 138 mmol/L (ref 135–145)
SODIUM: 140 mmol/L (ref 135–145)
SODIUM: 140 mmol/L (ref 135–145)
SODIUM: 142 mmol/L (ref 135–145)
SODIUM: 140 mmol/L (ref 135–145)
Sodium: 137 mmol/L (ref 135–145)
Sodium: 138 mmol/L (ref 135–145)
Sodium: 139 mmol/L (ref 135–145)
Sodium: 139 mmol/L (ref 135–145)
Sodium: 139 mmol/L (ref 135–145)
Sodium: 139 mmol/L (ref 135–145)
Sodium: 137 mmol/L (ref 135–145)
Sodium: 139 mmol/L (ref 135–145)
Sodium: 138 mmol/L (ref 135–145)
Sodium: 139 mmol/L (ref 135–145)
Sodium: 139 mmol/L (ref 135–145)
Sodium: 139 mmol/L (ref 135–145)
Sodium: 138 mmol/L (ref 135–145)
Sodium: 139 mmol/L (ref 135–145)
Sodium: 139 mmol/L (ref 135–145)
Sodium: 140 mmol/L (ref 135–145)
Sodium: 140 mmol/L (ref 135–145)
TCO2: 25 mmol/L (ref 22–32)
TCO2: 25 mmol/L (ref 22–32)
TCO2: 26 mmol/L (ref 22–32)
TCO2: 26 mmol/L (ref 22–32)
TCO2: 28 mmol/L (ref 22–32)
TCO2: 28 mmol/L (ref 22–32)
TCO2: 28 mmol/L (ref 22–32)
TCO2: 29 mmol/L (ref 22–32)
TCO2: 29 mmol/L (ref 22–32)
TCO2: 30 mmol/L (ref 22–32)
TCO2: 30 mmol/L (ref 22–32)
TCO2: 30 mmol/L (ref 22–32)
TCO2: 31 mmol/L (ref 22–32)
TCO2: 31 mmol/L (ref 22–32)
TCO2: 31 mmol/L (ref 22–32)
TCO2: 31 mmol/L (ref 22–32)
TCO2: 31 mmol/L (ref 22–32)
TCO2: 32 mmol/L (ref 22–32)
TCO2: 32 mmol/L (ref 22–32)
TCO2: 32 mmol/L (ref 22–32)
TCO2: 32 mmol/L (ref 22–32)
TCO2: 32 mmol/L (ref 22–32)
TCO2: 32 mmol/L (ref 22–32)
TCO2: 34 mmol/L — ABNORMAL HIGH (ref 22–32)
TCO2: 34 mmol/L — ABNORMAL HIGH (ref 22–32)
TCO2: 35 mmol/L — ABNORMAL HIGH (ref 22–32)
TCO2: 35 mmol/L — ABNORMAL HIGH (ref 22–32)
TCO2: 35 mmol/L — ABNORMAL HIGH (ref 22–32)
TCO2: 36 mmol/L — ABNORMAL HIGH (ref 22–32)

## 2018-06-15 LAB — CBC
HEMATOCRIT: 24.4 % — AB (ref 39.0–52.0)
HEMOGLOBIN: 7.7 g/dL — AB (ref 13.0–17.0)
MCH: 33.8 pg (ref 26.0–34.0)
MCHC: 31.6 g/dL (ref 30.0–36.0)
MCV: 107 fL — AB (ref 78.0–100.0)
Platelets: 73 10*3/uL — ABNORMAL LOW (ref 150–400)
RBC: 2.28 MIL/uL — AB (ref 4.22–5.81)
RDW: 17.5 % — ABNORMAL HIGH (ref 11.5–15.5)
WBC: 11.5 10*3/uL — ABNORMAL HIGH (ref 4.0–10.5)

## 2018-06-15 LAB — GLUCOSE, CAPILLARY
GLUCOSE-CAPILLARY: 116 mg/dL — AB (ref 70–99)
Glucose-Capillary: 190 mg/dL — ABNORMAL HIGH (ref 70–99)
Glucose-Capillary: 142 mg/dL — ABNORMAL HIGH (ref 70–99)

## 2018-06-15 LAB — PARATHYROID HORMONE, INTACT (NO CA): PTH: 65 pg/mL (ref 15–65)

## 2018-06-15 LAB — MAGNESIUM: Magnesium: 2.2 mg/dL (ref 1.7–2.4)

## 2018-06-15 MED ORDER — MIDODRINE HCL 5 MG PO TABS
10.0000 mg | ORAL_TABLET | Freq: Three times a day (TID) | ORAL | Status: DC
  Administered 2018-06-15 – 2018-06-16 (×5): 10 mg via ORAL
  Filled 2018-06-15 (×5): qty 2

## 2018-06-15 MED ORDER — ENSURE ENLIVE PO LIQD
237.0000 mL | Freq: Three times a day (TID) | ORAL | Status: DC
  Administered 2018-06-15 – 2018-06-16 (×2): 237 mL via ORAL

## 2018-06-15 MED ORDER — PRO-STAT SUGAR FREE PO LIQD
30.0000 mL | Freq: Three times a day (TID) | ORAL | Status: DC
  Administered 2018-06-15 – 2018-06-16 (×3): 30 mL via ORAL
  Filled 2018-06-15 (×4): qty 30

## 2018-06-15 NOTE — Progress Notes (Signed)
Social Note:   We appreciate the great care provided by CCM. Family medicine will continue to follow along.  Peggyann ShoalsHannah Jakylan Ron, DO [facility], PGY-1 06/15/2018 7:51 AM

## 2018-06-15 NOTE — Progress Notes (Addendum)
PULMONARY / CRITICAL CARE MEDICINE   NAME:  Caleb BaldingJeffrey E Nicoson, MRN:  [id number], DOB:  12-Feb-1966, LOS: 25 ADMISSION DATE:  05/01/2018, CONSULTATION DATE:  06/04/2018 REFERRING MD:  Gwendolyn GrantWalden, CHIEF COMPLAINT:  Cardiac arrest  BRIEF HISTORY:    This is a 52 y.o.malewith hx severe CAD, AS, sCHF (echo from Aug 2019 with EF 15-20%), HIV on HAART, ESRD on HD (for almost 20 years). Initially admitted 8/23 with NSTEMI. On 8/26, had cath that showed multivessel disease; therefore, on 9/3, had PCI to LAD and was also being worked up for a TAVR that he was scheduled to receive 06/25/2018. On 9/9 had brief PEA arrest with 2mins CPR thought r/t 3rd degree block. Had recurrent arrest later that night requiring transcutaneous pacing. He remained conscious following both episodes but had progressive respiratory failure r/t pulmonary edema requiring intubation.  Went for temp Temp Pacemaker on 9/9, returned to ICU CRRT started. Requiring high PEEP/FIO2 as well as high dose vasopressors for cardiogenic shock.  9/10: still positive volume status. WBCs remain up. FiO2 and PEEP are improving.  9/12 volume removal with CVVHD limited by hypotension/pressor needs. Plts trending down->36 9/13: PEEP, FiO2, pressor requirements all improved. Sputum cultures demonstrating Klebsiella and B strep, antibiotics changed to ceftriaxone monotherapy.  9/14: Successfully extubated.  9/15 tolerating intermittent dialysis, on low-dose pressors more awake and alert 9/16: Awake, alert no distress weaning pressors still.  Placed back on CRRT,midorine started 9/17: Acutely delirious following Ambien.  Remains on CRRT.  Increasing midodrine   STUDIES:   No recent CULTURES:  9/10: Sputum culture: Few group B strep, few Klebsiella pneumoniae  ANTIBIOTICS:  HAART Vanc 9/10>>> 9/13 Cefepime 9/10>>> 9/13 Ceftriaxone 9/13:  Procedures::  R/LHC 8/26 > 1st  diag lesion 100% stenosed, prox LAD to mid LAD 95% stenosed, 2nd diag lesion  99% stenosed, 1st Mrg lesion 90% stenosed, prox RCA to mid RCA 20% stenosed, mid RCA lesion 20% stenosed. 9/9 intubated>>> 9/14  CONSULTANTS:  Cardiology 8/23 >  Nephrology 8/24 >  CVTS 8/27 >  evaluated in follow-up consultation 9/17: Felt unlikely to improve with TAVR or permanent pacemaker  SUBJECTIVE:  He is confused today denies shortness of breath or pain  CONSTITUTIONAL: Blood Pressure (Abnormal) 88/55   Pulse 77   Temperature (Abnormal) 97.4 F (36.3 C) (Axillary)   Respiration 16   Height 5\' 11"  (1.803 m)   Weight 64 kg   Oxygen Saturation 100%   Body Mass Index 19.68 kg/m   I/O last 3 completed shifts: In: 2270 [P.O.:530; I.V.:1237.3; IV Piggyback:502.7] Out: 1050 [Other:1050]        PHYSICAL EXAM: General: 52 year old male patient currently resting in bed he is encephalopathic this morning but cooperative HEENT normocephalic atraumatic no jugular venous distention Pulmonary: Decreased bases otherwise clear Cardiac: Regular rate and rhythm Abdomen: Soft nontender GU: Anuric Extremities: Warm and dry : Awake, confused, moves all extremities.   RESOLVED PROBLEM LIST  Acute respiratory failure Cardiac arrest Ileus Complete heart block  ASSESSMENT AND PLAN   Status post PEA cardiac arrest secondary to complete heart block -Needs permanent pacemaker, challenges that he is end-stage renal disease and risk for lead infection Plan Continuing telemetry monitoring Would need permanent pacemaker if we were to proceed with TAVR given high risk of complete heart block. Awaiting final decision from cardiology.  If TAVR indeed placed on hold primary focus should be on requiring him from shock discontinuing transvenous pacer today  Non-ST elevation MI, status post PCI and arthrectomy from mid LAD  on 9/3 Plan Aspirin, statin, and Plavix   Heart failure with reduced ejection fraction EF 25 to 30%, further complicated by severe aortic stenosis Cardiogenic  shock -Needs TAVR;  not a candidate for surgical replacement -Pressor requirements improved Plan Titrating midodrine up to 10 mg 3 times daily Weaning norepinephrine for map of 50 or greater Continuing telemetry  Acute toxic encephalopathy secondary to Ambien Plan Supportive care DC Ambien  Pneumonia.  Favor aspiration; sputum showing both Klebsiella pneumoniae and B strep Chest x-ray personally reviewed: Showing bilateral haziness this is a little bit increased compared to prior film.  I suspect this reflects more pulmonary edema as we had been allowing for positive volume status Plan Completing ceftriaxone today  Continue pulmonary hygiene   End-stage renal disease Plan Intermittent dialysis, once off CRRT  Intermittent fluid and electrolyte imbalance: Hypophosphatemia Plan Chemistry replace as indicated  Mild anemia without evidence of bleeding Plan And CBC transfuse as indicated  Thrombocytopenia -Platelet count is improving Plan Trend CBC  HIV Plan You ART therapy  Hyper and hypoglycemia.  This had gotten better now elevated again  plan Sliding scale insulin   SUMMARY OF TODAY'S PLAN:  We are continuing to wean pressors.  Titrating midodrine up.  He is encephalopathic today, this is from Ambien likely.  At this point we will continue focus on shock therapy.  I have discussed the case with both cardiology and nephrology.  Plan at this point is to see about possibly placing leadless pacer, additional ED consultation pending.  If this is declined the next step will be to obtain a venogram, to ensure that we are for sure dealing with an occluded right subclavian and IJ.  If these were found to be patent consideration for venous pacer would need to be considered as would only be relative risk.  If we cannot proceed with pacemaker, and TVAR off table he will need palliative care  Best Practice / Goals of Care / Disposition.   DVT PROPHYLAXIS:SCD NUTRITION:Advance  diet MOBILITY:BR, can advance once temporary pacer removed FAMILY DISCUSSIONS: Discussed with wife at bedside   LABS  Glucose Recent Labs  Lab 06/14/18 0347 06/14/18 0736 06/14/18 1232 06/14/18 1622 06/14/18 2018 06/15/18 0814  GLUCAP 84 88 120* 171* 126* 190*    BMET Recent Labs  Lab 06/14/18 0349 06/14/18 1600  06/15/18 0356  06/15/18 0826 06/15/18 0954 06/15/18 1001  NA 138 139   < > 139  139   < > 139 138 139  K 3.6 4.0   < > 3.4*  3.5   < > 3.2* 3.2* 3.3*  CL 104 100   < > 90*  92*   < > 92* 92* 89*  CO2 25 24  --  29  --   --   --   --   BUN 11 14   < > 11  12   < > 12 10 7   CREATININE 1.38* 2.04*   < > 1.50*  1.86*   < > 1.50* 1.50* 1.10  GLUCOSE 90 150*   < > 177*  157*   < > 161* 198* 250*   < > = values in this interval not displayed.    Liver Enzymes Recent Labs  Lab 06/14/18 0349 06/14/18 1600 06/15/18 0356  ALBUMIN 2.0* 1.9* 2.0*    Electrolytes Recent Labs  Lab 06/13/18 0513  06/14/18 0349 06/14/18 1600 06/15/18 0356  CALCIUM 8.5*   < > 8.2* 8.1* 9.5  MG 2.6*  --  2.6*  --  2.2  PHOS 2.4*   < > 1.3* 2.9 2.6   < > = values in this interval not displayed.    CBC Recent Labs  Lab 06/13/18 0826 06/14/18 0345  06/15/18 0356  06/15/18 0826 06/15/18 0954 06/15/18 1001  WBC 11.0* 9.7  --  11.5*  --   --   --   --   HGB 9.1* 8.3*   < > 8.8*  7.7*   < > 14.6 8.5* 9.2*  HCT 28.3* 26.6*   < > 26.0*  24.4*   < > 43.0 25.0* 27.0*  PLT 34* 44*  --  73*  --   --   --   --    < > = values in this interval not displayed.    ABG Recent Labs  Lab 06/09/18 0415 06/10/18 0416  PHART 7.430 7.503*  PCO2ART 32.5 32.9  PO2ART 213* 244.0*    Coag's Recent Labs  Lab 06/09/18 0414 06/10/18 0415  APTT 84* 52*    Sepsis Markers No results for input(s): LATICACIDVEN, PROCALCITON, O2SATVEN in the last 168 hours.  Cardiac Enzymes No results for input(s): TROPONINI, PROBNP in the last 168 hours. Simonne Martinet ACNP-BC [facility]  Pulmonary/Critical Care Pager # [phone] OR # [phone] if no answer

## 2018-06-15 NOTE — Progress Notes (Signed)
Nutrition Follow-up  DOCUMENTATION CODES:   Non-severe (moderate) malnutrition in context of chronic illness  INTERVENTION:   Add Ensure Enlive po TID, each supplement provides 350 kcal and 20 grams of protein  Add 30 ml Prostat TID, each supplement provides 100 kcals and 15 grams protein.   Continue Rena-Vit; may need additional supplement given prolonged period on CRRT  Downgrade diet to Dysphagia III (easy to chew); meats will come chopped, soft fruits/vegetables, etc as pt with no upper teeth  NUTRITION DIAGNOSIS:   Moderate Malnutrition related to chronic illness(ESRD on HD, CHF EF 15-20%, HIV) as evidenced by moderate fat depletion, moderate muscle depletion.  Being addressed as diet advanced post extubation, supplements  GOAL:   Patient will meet greater than or equal to 90% of their needs  Progressing  MONITOR:   PO intake, Supplement acceptance, Labs, Weight trends, Skin  REASON FOR ASSESSMENT:   Ventilator, Consult Enteral/tube feeding initiation and management  ASSESSMENT:   52 yo male admitted on 8/23 with NSTEMI. On 8/26 pt had cath showing multivessel disease and subsequently had PCI to LAD on 9/3. Pt had TAVR scheduled for 9/10 but on evening on 9/9 pt with PEA arrest and pulmonary edema requiring intubation and transfer to ICU. PMH includes NSTEMI, HTN, CAD, CHF EF 15-20%, HIV on HAART, ESRD on HD  9/09 PEA arrest 9/10 CVVHD started 9/14 Extubated 9/15 Intermittent HD 9/16 Re-started on CRRT  Remains on CRRT, confused this AM, restless, levophed being weaned. Pt appears very weak Recorded po intake 50-100% of meals  Pt does not have top teeth; pt with difficulty chewing. Recommend changing diet to Dysphagia III (mechanical soft). Current Soft diet is GI soft  EDW 66 kg, current wt 64 kg.  Nutrition Focused Physical Exam re-assessed today; previous assessment difficult due to significant edema. Pt dry at present. Muscle depletions and Subcutaneous  fat loss noted today  No pressure injury charted, discussed skin assessment with RN today who indicates skin starting to show some signs of breakdown. Pink foam to sacrum, heels  Noted pt off phosphorus binder, phosphorus has been acceptable (low for HD patient at 2.6)  Labs: potassium 3.2, ionized calcium 0.38 Meds:  Rena-Vit, ferric gluconate, calcium gluconate  NUTRITION - FOCUSED PHYSICAL EXAM:    Most Recent Value  Orbital Region  Mild depletion  Upper Arm Region  Moderate depletion  Thoracic and Lumbar Region  Moderate depletion  Buccal Region  Moderate depletion  Temple Region  Moderate depletion  Clavicle Bone Region  Moderate depletion  Clavicle and Acromion Bone Region  Moderate depletion  Scapular Bone Region  Moderate depletion  Dorsal Hand  Mild depletion  Patellar Region  Mild depletion  Anterior Thigh Region  Moderate depletion  Posterior Calf Region  Unable to assess  Edema (RD Assessment)  Mild       Diet Order:   Diet Order            DIET DYS 3 Room service appropriate? Yes; Fluid consistency: Thin  Diet effective now              EDUCATION NEEDS:   Not appropriate for education at this time  Skin:  Skin Assessment: Skin Integrity Issues: Skin Integrity Issues:: Other (Comment) Other: MASD: sacrum, anus; non-pressure related wound on chest  Last BM:  9/17  Height:   Ht Readings from Last 1 Encounters:  05/27/18 5\' 11"  (1.803 m)    Weight:   Wt Readings from Last 1 Encounters:  06/15/18  64 kg    Ideal Body Weight:     BMI:  Body mass index is 19.68 kg/m.  Estimated Nutritional Needs:   Kcal:  2000-2300 kcals   Protein:  105-130 g  Fluid:  1000 mL plus UOP (no restriction while on CRRT)   Romelle Starcher MS, RD, LDN, CNSC [phone] Pager  [phone] Weekend/On-Call Pager

## 2018-06-15 NOTE — Progress Notes (Signed)
[address]       [place] [id number]             [phone]        CARDIOTHORACIC SURGERY PROGRESS NOTE   R8 Days Post-Op Procedure(s) (LRB): TEMPORARY PACEMAKER (N/A)  Subjective: Somewhat confused and depressed.  Denies SOB or pain.  Looks weak.  Still on levophed drip and CRRT  Objective: Vital signs: BP Readings from Last 1 Encounters:  06/15/18 (!) 88/55   Pulse Readings from Last 1 Encounters:  06/15/18 77   Resp Readings from Last 1 Encounters:  06/15/18 16   Temp Readings from Last 1 Encounters:  06/15/18 (!) 97.4 F (36.3 C) (Axillary)    Hemodynamics:    Physical Exam:  Rhythm:   sinus  Breath sounds: Diminished at bases  Heart sounds:  RRR w/ systolic murmur  Incisions:  n/a  Abdomen:  soft  Extremities:  warm   Intake/Output from previous day: 09/16 0701 - 09/17 0700 In: 2022.3 [P.O.:500; I.V.:1069.6; IV Piggyback:452.8] Out: 938  Intake/Output this shift: Total I/O In: 151.2 [I.V.:151.2] Out: 56 [Other:56]  Lab Results:  CBC: Recent Labs    06/14/18 0345  06/15/18 0356  06/15/18 0816 06/15/18 0826  WBC 9.7  --  11.5*  --   --   --   HGB 8.3*   < > 8.8*  7.7*   < > 9.2* 14.6  HCT 26.6*   < > 26.0*  24.4*   < > 27.0* 43.0  PLT 44*  --  73*  --   --   --    < > = values in this interval not displayed.    BMET:  Recent Labs    06/14/18 1600  06/15/18 0356  06/15/18 0816 06/15/18 0826  NA 139   < > 139  139   < > 140 139  K 4.0   < > 3.4*  3.5   < > 3.3* 3.2*  CL 100   < > 90*  92*   < > 89* 92*  CO2 24  --  29  --   --   --   GLUCOSE 150*   < > 177*  157*   < > 215* 161*  BUN 14   < > 11  12   < > 8 12  CREATININE 2.04*   < > 1.50*  1.86*   < > 1.10 1.50*  CALCIUM 8.1*  --  9.5  --   --   --    < > = values in this interval not displayed.     PT/INR:  No results for input(s): LABPROT, INR in the last 72 hours.  CBG (last 3)  Recent Labs    06/14/18 1622 06/14/18 2018 06/15/18 0814    GLUCAP 171* 126* 190*    ABG    Component Value Date/Time   PHART 7.503 (H) 06/10/2018 0416   PCO2ART 32.9 06/10/2018 0416   PO2ART 244.0 (H) 06/10/2018 0416   HCO3 25.9 06/10/2018 0416   TCO2 34 (H) 06/15/2018 0826   ACIDBASEDEF 2.5 (H) 06/09/2018 0415   O2SAT 100.0 06/10/2018 0416    CXR: Mild CHF  Assessment/Plan: S/P Procedure(s) (LRB): TEMPORARY PACEMAKER (N/A)  At present patient remains critically ill on Levophed for blood pressure support undergoing CRRT via temporary hemodialysis catheter in the femoral vein.  There have not been any clinical signs to suggest that the patient's recent cardiac arrest were related  to myocardial ischemia or other complications related to recent PCI.  To my knowledge the patient has not had any further episodes of complete heart block since his cardiac arrest on 9/9 although the patient does have right bundle branch block.  There is no question that he remains at high risk for recurrent episodes of complete heart block and he would need permanent pacemaker implantation if transcatheter aortic valve replacement were to be considered.  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  The innominate vein and left subclavian vein remain patent but the patient has long-term dialysis access via left arm fistula.  It is unclear whether or not the patient might continue to recover to the point where he could be weaned off of inotropic support and converted back to conventional intermittent dialysis, i.e. similar conditions to his status prior to his cardiac arrest.  I do not feel that implantation of a permanent pacemaker and/or TAVR would be likely to accelerate his recovery, and as such I do not feel that the patient should be considered candidate for TAVR at this time.   Under the circumstances it seems reasonable to delay any decisions regarding  implantation of a permanent pacemaker for the time being, although there remains significant risk the patient could develop recurrent complete heart block.  If the patient does continue to improve we can consider TAVR in the future, perhaps later during this hospitalization.   I spent in excess of 30 minutes during the conduct of this hospital encounter and >50% of this time involved direct face-to-face encounter with the patient for counseling and/or coordination of their care.     Purcell Nails, MD 06/15/2018 9:19 AM

## 2018-06-15 NOTE — Progress Notes (Signed)
Subjective: Interval History: system staying open on citrate, still on pressors. Cuff pressures are better than a-line pressures.   Objective: Vital signs in last 24 hours: Temp:  [97 F (36.1 C)-97.4 F (36.3 C)] 97.4 F (36.3 C) (09/17 0400) Pulse Rate:  [77-89] 77 (09/17 0500) Resp:  [16-20] 16 (09/17 0400) BP: (79-124)/(55-99) 94/66 (09/17 1000) SpO2:  [97 %-100 %] 100 % (09/17 0500) Weight:  [64 kg] 64 kg (09/17 0500) Weight change: 0.7 kg  Intake/Output from previous day: 09/16 0701 - 09/17 0700 In: 2022.3 [P.O.:500; I.V.:1069.6; IV Piggyback:452.8] Out: 938  Intake/Output this shift: Total I/O In: 226.9 [I.V.:226.9] Out: 110 [Other:110]  General appearance: cooperative, no distress and slowed mentation Resp: rales bibasilar and rhonchi bibasilar Cardio: S1, S2 normal and systolic murmur: systolic ejection 3/6, crescendo and decrescendo at 2nd left intercostal space GI: liver down 6 cm , pos bos, soft Extremities: AVF LUA, Hd cath R groin, no LE edema or UE edema TV pacer in groin  CXR 9/13 - clear  TTS [facility] 3.5h 66kg 2K/2.5Ca P4 LUE AVG Hep none -Hectorol 3 mcg IV TIW -Venofer 50mg  IV q weeks  -Mircera 100mcg IV q 2 weeks last 8/20  -Ca acetate 4 tid qac--> stopped while hospitalized in light of hyperCa  Assessment/Plan: 1 ESRD CRRT stable , still low BP's but would go by cuff pressures, ?get a-line out, cont to attempt to wean off pressors. Shock situation is main issue, keeping +50/hr as is under dry wt and lungs clear for now.  2 CAD per cards 3 AV dz per cards 4 Heart block - TV pacer being removed, no HB for 2 days 5 Anemia check Fe 6 HPTH check 7 HIV  P CRRT, citrate, +50/hr, try wean off pressors   LOS: 24 days   Maree KrabbeRobert D Konni Kesinger 06/14/2018,10:56 AM  Lab Results: Recent Labs    06/14/18 0345  06/15/18 0356  06/15/18 0954 06/15/18 1001  WBC 9.7  --  11.5*  --   --   --   HGB 8.3*   < > 8.8*  7.7*   < > 8.5* 9.2*  HCT 26.6*   < >  26.0*  24.4*   < > 25.0* 27.0*  PLT 44*  --  73*  --   --   --    < > = values in this interval not displayed.   BMET:  Recent Labs    06/14/18 1600  06/15/18 0356  06/15/18 0954 06/15/18 1001  NA 139   < > 139  139   < > 138 139  K 4.0   < > 3.4*  3.5   < > 3.2* 3.3*  CL 100   < > 90*  92*   < > 92* 89*  CO2 24  --  29  --   --   --   GLUCOSE 150*   < > 177*  157*   < > 198* 250*  BUN 14   < > 11  12   < > 10 7  CREATININE 2.04*   < > 1.50*  1.86*   < > 1.50* 1.10  CALCIUM 8.1*  --  9.5  --   --   --    < > = values in this interval not displayed.   No results for input(s): PTH in the last 72 hours. Iron Studies:  Recent Labs    06/13/18 0826  IRON 38*  TIBC 210*    Studies/Results: Dg Chest  Port 1 View  Result Date: 06/15/2018 CLINICAL DATA:  52 year old with pneumonia. End-stage renal disease. Recent placement of a temporary transvenous pacemaker. EXAM: PORTABLE CHEST 1 VIEW COMPARISON:  06/11/2018 FINDINGS: Endotracheal tube and nasogastric tube have been removed. Again noted are vascular stents in both arms and left upper chest. Increased hazy lung densities particularly in the right upper lung. Heart size is mildly enlarged but stable. Cardiac lead has been pulled back and the tip is in the right atrium region. IMPRESSION: Increased hazy densities in both lungs, particularly in the right upper lung. Differential diagnosis is asymmetric pulmonary edema versus atypical infection. The femoral temporary pacemaker lead has changed in position. The lead is now in the right atrium. This finding was discussed with the patient's nurse, Tonya on 06/15/2018 at 0927 hours. Electronically Signed   By: Richarda Overlie M.D.   On: 06/15/2018 09:28

## 2018-06-15 NOTE — Progress Notes (Signed)
Progress Note  Patient Name: [name] Date of Encounter: 06/15/2018  Primary Cardiologist: Thurmon Fair, MD   Subjective   Little confused, mildly encephalopathic this morning.  Received Ambien last night. Remains hypotensive, but making slow progress with weaning levo.  On CRRT. Has not required ventricular pacing in more than 2 days.  X-ray shows that the temporary venous pacemaker lead is retracted to the right atrium.  It was removed today.  Inpatient Medications    Scheduled Meds: . aspirin  81 mg Oral Daily  . clopidogrel  75 mg Oral Daily  . darbepoetin (ARANESP) injection - NON-DIALYSIS  150 mcg Subcutaneous Q Fri  . darunavir-cobicistat  1 tablet Oral Q breakfast  . dolutegravir  50 mg Oral Q supper  . gabapentin  100 mg Oral Daily  . hydrocortisone sodium succinate  50 mg Intravenous Q6H  . mouth rinse  15 mL Mouth Rinse BID  . midodrine  10 mg Oral TID WC  . multivitamin  1 tablet Oral QHS  . rosuvastatin  10 mg Oral q1800  . sodium chloride flush  3 mL Intravenous Q12H  . tenofovir  300 mg Oral Weekly   Continuous Infusions: . sodium chloride 10 mL/hr at 06/15/18 1000  . calcium gluconate infusion for CRRT 20 g (06/15/18 1030)  . cefTRIAXone (ROCEPHIN)  IV Stopped (06/14/18 2151)  . famotidine (PEPCID) IV Stopped (06/14/18 2224)  . ferric gluconate (FERRLECIT/NULECIT) IV Stopped (06/05/18 1531)  . norepinephrine (LEVOPHED) Adult infusion 4 mcg/min (06/15/18 1030)  . dialysis replacement fluid (prismasate) 200 mL/hr at 06/14/18 1308  . dialysate (PRISMASATE) 1,800 mL/hr at 06/15/18 0945  . sodium chloride    . sodium chloride    . sodium citrate 2 %/dextrose 2.5% solution 3000 mL 250 mL/hr at 06/15/18 0915   PRN Meds: sodium chloride, acetaminophen, Gerhardt's butt cream, heparin, ipratropium-albuterol, midazolam, nitroGLYCERIN, ondansetron (ZOFRAN) IV, polyvinyl alcohol, sodium chloride, sodium chloride, sodium chloride flush   Vital Signs    Vitals:   06/15/18 0400 06/15/18 0500 06/15/18 0600 06/15/18 1000  BP: 101/68  (!) 88/55 94/66  Pulse:  77    Resp: 16     Temp: (!) 97.4 F (36.3 C)     TempSrc: Axillary     SpO2: 100% 100%    Weight:  64 kg    Height:        Intake/Output Summary (Last 24 hours) at 06/15/2018 1055 Last data filed at 06/15/2018 1000 Gross per 24 hour  Intake 2057.28 ml  Output 1010 ml  Net 1047.28 ml   Filed Weights   06/13/18 0500 06/14/18 0500 06/15/18 0500  Weight: 63.1 kg 63.3 kg 64 kg    Telemetry    Sinus rhythm with frequent PVCs- Personally Reviewed  ECG    Sinus rhythm, right bundle branch block, borderline criteria for posterior fascicular block- Personally Reviewed  Physical Exam  Looks weak, frail, opens eyes and holds conversation but is a little confused GEN: No acute distress.   Neck: No JVD Cardiac: RRR, mid peaking 3/6 systolic ejection murmur, 2/6 diastolic decrescendo aortic insufficiency murmur, no rubs or gallops.  Patent AV fistula with good thrill and bruit left upper  arm Respiratory: Clear to auscultation bilaterally. GI: Soft, nontender, non-distended  MS: No edema; No deformity. Neuro:  Nonfocal  Psych: Normal affect   Labs    Chemistry Recent Labs  Lab 06/14/18 0349 06/14/18 1600  06/15/18 0356  06/15/18 0826 06/15/18 0954 06/15/18 1001  NA 138 139   < >  139  139   < > 139 138 139  K 3.6 4.0   < > 3.4*  3.5   < > 3.2* 3.2* 3.3*  CL 104 100   < > 90*  92*   < > 92* 92* 89*  CO2 25 24  --  29  --   --   --   --   GLUCOSE 90 150*   < > 177*  157*   < > 161* 198* 250*  BUN 11 14   < > 11  12   < > 12 10 7   CREATININE 1.38* 2.04*   < > 1.50*  1.86*   < > 1.50* 1.50* 1.10  CALCIUM 8.2* 8.1*  --  9.5  --   --   --   --   ALBUMIN 2.0* 1.9*  --  2.0*  --   --   --   --   GFRNONAA 58* 36*  --  41*  --   --   --   --   GFRAA >60 42*  --  47*  --   --   --   --   ANIONGAP 9 15  --  18*  --   --   --   --    < > = values in this interval not  displayed.     Hematology Recent Labs  Lab 06/13/18 0826 06/14/18 0345  06/15/18 0356  06/15/18 0826 06/15/18 0954 06/15/18 1001  WBC 11.0* 9.7  --  11.5*  --   --   --   --   RBC 2.62* 2.47*  --  2.28*  --   --   --   --   HGB 9.1* 8.3*   < > 8.8*  7.7*   < > 14.6 8.5* 9.2*  HCT 28.3* 26.6*   < > 26.0*  24.4*   < > 43.0 25.0* 27.0*  MCV 108.0* 107.7*  --  107.0*  --   --   --   --   MCH 34.7* 33.6  --  33.8  --   --   --   --   MCHC 32.2 31.2  --  31.6  --   --   --   --   RDW 17.8* 17.7*  --  17.5*  --   --   --   --   PLT 34* 44*  --  73*  --   --   --   --    < > = values in this interval not displayed.    Cardiac EnzymesNo results for input(s): TROPONINI in the last 168 hours. No results for input(s): TROPIPOC in the last 168 hours.   BNPNo results for input(s): BNP, PROBNP in the last 168 hours.   DDimer No results for input(s): DDIMER in the last 168 hours.   Radiology    Dg Chest Port 1 View  Result Date: 06/15/2018 CLINICAL DATA:  52 year old with pneumonia. End-stage renal disease. Recent placement of a temporary transvenous pacemaker. EXAM: PORTABLE CHEST 1 VIEW COMPARISON:  06/11/2018 FINDINGS: Endotracheal tube and nasogastric tube have been removed. Again noted are vascular stents in both arms and left upper chest. Increased hazy lung densities particularly in the right upper lung. Heart size is mildly enlarged but stable. Cardiac lead has been pulled back and the tip is in the right atrium region. IMPRESSION: Increased hazy densities in both lungs, particularly in the right upper lung. Differential diagnosis is  asymmetric pulmonary edema versus atypical infection. The femoral temporary pacemaker lead has changed in position. The lead is now in the right atrium. This finding was discussed with the patient's nurse, Tonya on 06/15/2018 at 0927 hours. Electronically Signed   By: Richarda Overlie M.D.   On: 06/15/2018 09:28   Cardiac Studies   PCI 06/28/2018  Prox LAD lesion  is 99% stenosed.  Post intervention, there is a 0% residual stenosis.  A drug-eluting stent was successfully placed.  1. Severe, heavily calcified stenosis in the mid LAD 2. Successful PTCA of the mid LAD with orbital atherectomy and stenting of the mid LAD.   Recommendations: Will continue DAPT with ASA and Plavix. Will continue planning for TAVR.  Recommend uninterrupted dual antiplatelet therapy with Aspirin 81mg  daily and Clopidogrel 75mg  dailyfor a minimum of 12 months (ACS - Class I recommendation)  TEE 05/10/2018 - Left ventricle: The cavity size was moderately dilated. Systolic function was severely reduced. The estimated ejection fractionwas in the range of 15% to 20%. Wall motion was normal; therewere no regional wall motion abnormalities. - Aortic valve: Trileaflet; severely thickened, moderatelycalcified leaflets; fusion of the left-noncoronary commissure.Cusp separation was reduced. Valve mobility was restricted. Therewas moderate stenosis. There was moderate to severeregurgitation. Peak velocity (S): 275 cm/s. Mean gradient (S): 19mm Hg. Valve area (VTI): 1.2 cm^2. Valve area (Vmax): 1.58 cm^2.Valve area (Vmean): 1.5 cm^2. Valve area by planimetry 0.79 cm^2. - Mitral valve: There was mild regurgitation. - Left atrium: No evidence of thrombus in the atrial cavity orappendage. No evidence of thrombus in the atrial cavity orappendage. - Right ventricle: The cavity size was normal. Wall thickness wasnormal. Systolic function was severely reduced. - Right atrium: No evidence of thrombus in the atrial cavity or appendage.  Impressions:  - Aortic stenosis is severe by planimetry but moderate by meangradient and peak velocity. Given the reduced systolic functionand severely calcified, consider low-flow, low-gradient severeaortic stenosis.  Patient Profile     52 y.o. 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  Extubated on 9/14.   Has not required pacing since 06/06/2018.  Transvenous pacing wire removed 06/15/2018.   On CRRT and intravenous norepinephrine since 06/06/2018  Assessment & Plan    1.  Cardiogenic shock: Still on intravenous norepinephrine, although making some progress with weaning 2. CHB:   Evidence of baseline conduction system disease, likely to progress to persistent heart block after TAVR.  He does not have good options for transvenous pacing.  Left upper extremity is being used for dialysis.  Based on previous evaluation for dialysis access, right IJ and right innominate may still be occluded.  Options include leadless right ventricular pacemaker or thoracotomy- epicardial left ventricular lead, both of which would have the same disadvantage of asynchronous ventricular pacing.  Placing a conventional transvenous lead based device does not appear to be an option and would be associated with the highest risk of infection. 2. 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  Was in the process of TAVR work-up, interrupted by his episode of complete heart block.  If we cannot provide reliable pacing, he cannot undergo TAVR.  Likewise, if he remains hypotensive on CRRT he is not a TAVR candidate and we might have to proceed with palliative care. 3.  CAD: s/p PCI-DES LAD 05/30/2018, with plans for dual antiplatelet therapy with aspirin and clopidogrel for 12 months. 4. CHF: Severely depressed left ventricular systolic function, presumed due to low gradient severe aortic stenosis plus/minus CAD.    Unable to receive beta-blockers or RAAS inhibitors due to hypotension. 5. ESRD: He has successfully survived through 19  years of hemodialysis.  Increased risk of lead infection if a fresh transvenous pacemaker is placed. 6. HIV: Compliant with meds, controlled, undetectable viral load 05/31/2018.  He is critically ill with multiple organ system involvement.  Consultation with the critical care service and electrophysiology services was necessary today.  Ongoing complex medical decision making is required.  Total time spent in patient care 65 minutes.     For questions or updates, please contact [facility] Please consult [email or web address] for contact info under        Signed, Thurmon Fair, MD  06/15/2018, 10:55 AM

## 2018-06-15 NOTE — Progress Notes (Signed)
CRITICAL VALUE ALERT  Critical Value:  CXR  Date & Time Notied:  06/15/18 1015  Provider Notified: Dr. Salena Saner.  Orders Received/Actions taken: Leave TVP turned off and remove TVP wire leave sheath.

## 2018-06-15 NOTE — Progress Notes (Signed)
eLink Physician-Brief Progress Note Patient Name: [name] DOB: [birth year] MRN: [id number]   Date of Service  06/15/2018  HPI/Events of Note  Diarrhea - Multiple watery stools with skin breakdown. Request for Flexiseal.   eICU Interventions  Will order Flexiseal.      Intervention Category Major Interventions: Other:  [name],[name] 06/15/2018, 10:38 PM

## 2018-06-15 NOTE — Progress Notes (Signed)
Pt HR-80s SR.  Transvenous pacer(VVI) on a backup rate of 50, 5mA, and 2 sensitivity.  Pacer began to [place]oo[place]ense patient.  Attempted to increase pacer sensitivity with pacer still oversensing.  Pt not requiring TVP at this time so pacer turned off.  Will continue to monitor patient and assess patient's need for TVP.

## 2018-06-16 ENCOUNTER — Encounter (HOSPITAL_COMMUNITY): Payer: Self-pay | Admitting: Primary Care

## 2018-06-16 ENCOUNTER — Inpatient Hospital Stay (HOSPITAL_COMMUNITY): Payer: Medicare Other

## 2018-06-16 DIAGNOSIS — Z515 Encounter for palliative care: Secondary | ICD-10-CM

## 2018-06-16 DIAGNOSIS — Z7189 Other specified counseling: Secondary | ICD-10-CM

## 2018-06-16 LAB — POCT I-STAT, CHEM 8
BUN: 10 mg/dL (ref 6–20)
BUN: 10 mg/dL (ref 6–20)
BUN: 11 mg/dL (ref 6–20)
BUN: 7 mg/dL (ref 6–20)
BUN: 8 mg/dL (ref 6–20)
BUN: 10 mg/dL (ref 6–20)
BUN: 8 mg/dL (ref 6–20)
BUN: 6 mg/dL (ref 6–20)
BUN: 9 mg/dL (ref 6–20)
CALCIUM ION: 0.35 mmol/L — AB (ref 1.15–1.40)
CALCIUM ION: 0.38 mmol/L — AB (ref 1.15–1.40)
CALCIUM ION: 0.41 mmol/L — AB (ref 1.15–1.40)
CALCIUM ION: 0.88 mmol/L — AB (ref 1.15–1.40)
CALCIUM ION: 0.94 mmol/L — AB (ref 1.15–1.40)
CALCIUM ION: 0.98 mmol/L — AB (ref 1.15–1.40)
CREATININE: 1.4 mg/dL — AB (ref 0.61–1.24)
CREATININE: 1.1 mg/dL (ref 0.61–1.24)
CREATININE: 1.4 mg/dL — AB (ref 0.61–1.24)
CREATININE: 1.1 mg/dL (ref 0.61–1.24)
Calcium, Ion: 0.91 mmol/L — ABNORMAL LOW (ref 1.15–1.40)
Calcium, Ion: 0.38 mmol/L — CL (ref 1.15–1.40)
Calcium, Ion: 0.36 mmol/L — CL (ref 1.15–1.40)
Chloride: 87 mmol/L — ABNORMAL LOW (ref 98–111)
Chloride: 87 mmol/L — ABNORMAL LOW (ref 98–111)
Chloride: 88 mmol/L — ABNORMAL LOW (ref 98–111)
Chloride: 92 mmol/L — ABNORMAL LOW (ref 98–111)
Chloride: 97 mmol/L — ABNORMAL LOW (ref 98–111)
Chloride: 87 mmol/L — ABNORMAL LOW (ref 98–111)
Chloride: 89 mmol/L — ABNORMAL LOW (ref 98–111)
Chloride: 87 mmol/L — ABNORMAL LOW (ref 98–111)
Chloride: 90 mmol/L — ABNORMAL LOW (ref 98–111)
Creatinine, Ser: 1 mg/dL (ref 0.61–1.24)
Creatinine, Ser: 1.1 mg/dL (ref 0.61–1.24)
Creatinine, Ser: 1.1 mg/dL (ref 0.61–1.24)
Creatinine, Ser: 1.3 mg/dL — ABNORMAL HIGH (ref 0.61–1.24)
Creatinine, Ser: 1.5 mg/dL — ABNORMAL HIGH (ref 0.61–1.24)
GLUCOSE: 124 mg/dL — AB (ref 70–99)
GLUCOSE: 145 mg/dL — AB (ref 70–99)
GLUCOSE: 201 mg/dL — AB (ref 70–99)
GLUCOSE: 216 mg/dL — AB (ref 70–99)
Glucose, Bld: 112 mg/dL — ABNORMAL HIGH (ref 70–99)
Glucose, Bld: 123 mg/dL — ABNORMAL HIGH (ref 70–99)
Glucose, Bld: 197 mg/dL — ABNORMAL HIGH (ref 70–99)
Glucose, Bld: 198 mg/dL — ABNORMAL HIGH (ref 70–99)
Glucose, Bld: 204 mg/dL — ABNORMAL HIGH (ref 70–99)
HCT: 24 % — ABNORMAL LOW (ref 39.0–52.0)
HCT: 24 % — ABNORMAL LOW (ref 39.0–52.0)
HCT: 25 % — ABNORMAL LOW (ref 39.0–52.0)
HCT: 25 % — ABNORMAL LOW (ref 39.0–52.0)
HCT: 23 % — ABNORMAL LOW (ref 39.0–52.0)
HCT: 24 % — ABNORMAL LOW (ref 39.0–52.0)
HEMATOCRIT: 44 % (ref 39.0–52.0)
HEMATOCRIT: 24 % — AB (ref 39.0–52.0)
HEMATOCRIT: 25 % — AB (ref 39.0–52.0)
HEMOGLOBIN: 15 g/dL (ref 13.0–17.0)
HEMOGLOBIN: 7.8 g/dL — AB (ref 13.0–17.0)
HEMOGLOBIN: 8.2 g/dL — AB (ref 13.0–17.0)
HEMOGLOBIN: 8.2 g/dL — AB (ref 13.0–17.0)
HEMOGLOBIN: 8.2 g/dL — AB (ref 13.0–17.0)
HEMOGLOBIN: 8.5 g/dL — AB (ref 13.0–17.0)
HEMOGLOBIN: 8.5 g/dL — AB (ref 13.0–17.0)
HEMOGLOBIN: 8.5 g/dL — AB (ref 13.0–17.0)
Hemoglobin: 8.2 g/dL — ABNORMAL LOW (ref 13.0–17.0)
POTASSIUM: 3.3 mmol/L — AB (ref 3.5–5.1)
Potassium: 2.7 mmol/L — CL (ref 3.5–5.1)
Potassium: 2.9 mmol/L — ABNORMAL LOW (ref 3.5–5.1)
Potassium: 3.2 mmol/L — ABNORMAL LOW (ref 3.5–5.1)
Potassium: 3.3 mmol/L — ABNORMAL LOW (ref 3.5–5.1)
Potassium: 3.5 mmol/L (ref 3.5–5.1)
Potassium: 3.1 mmol/L — ABNORMAL LOW (ref 3.5–5.1)
Potassium: 3.5 mmol/L (ref 3.5–5.1)
Potassium: 3.2 mmol/L — ABNORMAL LOW (ref 3.5–5.1)
SODIUM: 140 mmol/L (ref 135–145)
SODIUM: 140 mmol/L (ref 135–145)
SODIUM: 140 mmol/L (ref 135–145)
SODIUM: 140 mmol/L (ref 135–145)
SODIUM: 141 mmol/L (ref 135–145)
SODIUM: 142 mmol/L (ref 135–145)
Sodium: 140 mmol/L (ref 135–145)
Sodium: 140 mmol/L (ref 135–145)
Sodium: 140 mmol/L (ref 135–145)
TCO2: 34 mmol/L — AB (ref 22–32)
TCO2: 34 mmol/L — AB (ref 22–32)
TCO2: 34 mmol/L — AB (ref 22–32)
TCO2: 35 mmol/L — AB (ref 22–32)
TCO2: 35 mmol/L — AB (ref 22–32)
TCO2: 36 mmol/L — AB (ref 22–32)
TCO2: 37 mmol/L — AB (ref 22–32)
TCO2: 38 mmol/L — AB (ref 22–32)
TCO2: 40 mmol/L — AB (ref 22–32)

## 2018-06-16 LAB — GLUCOSE, CAPILLARY
GLUCOSE-CAPILLARY: 127 mg/dL — AB (ref 70–99)
GLUCOSE-CAPILLARY: 122 mg/dL — AB (ref 70–99)
Glucose-Capillary: 113 mg/dL — ABNORMAL HIGH (ref 70–99)
Glucose-Capillary: 78 mg/dL (ref 70–99)

## 2018-06-16 LAB — CALCIUM, IONIZED: Calcium, Ionized, Serum: 4.5 mg/dL (ref 4.5–5.6)

## 2018-06-16 LAB — CBC WITH DIFFERENTIAL/PLATELET
BASOS ABS: 0 10*3/uL (ref 0.0–0.1)
Basophils Relative: 0 %
EOS PCT: 0 %
Eosinophils Absolute: 0 10*3/uL (ref 0.0–0.7)
HEMATOCRIT: 24.3 % — AB (ref 39.0–52.0)
Hemoglobin: 7.5 g/dL — ABNORMAL LOW (ref 13.0–17.0)
LYMPHS ABS: 1.2 10*3/uL (ref 0.7–4.0)
LYMPHS PCT: 18 %
MCH: 33.6 pg (ref 26.0–34.0)
MCHC: 30.9 g/dL (ref 30.0–36.0)
MCV: 109 fL — ABNORMAL HIGH (ref 78.0–100.0)
MONOS PCT: 7 %
Monocytes Absolute: 0.5 10*3/uL (ref 0.1–1.0)
NEUTROS PCT: 75 %
Neutro Abs: 5.2 10*3/uL (ref 1.7–7.7)
PLATELETS: 74 10*3/uL — AB (ref 150–400)
RBC: 2.23 MIL/uL — AB (ref 4.22–5.81)
RDW: 17.5 % — AB (ref 11.5–15.5)
WBC: 6.9 10*3/uL (ref 4.0–10.5)

## 2018-06-16 LAB — RENAL FUNCTION PANEL
ANION GAP: 18 — AB (ref 5–15)
Albumin: 1.9 g/dL — ABNORMAL LOW (ref 3.5–5.0)
BUN: 10 mg/dL (ref 6–20)
CHLORIDE: 88 mmol/L — AB (ref 98–111)
CO2: 35 mmol/L — AB (ref 22–32)
CREATININE: 1.82 mg/dL — AB (ref 0.61–1.24)
Calcium: 10 mg/dL (ref 8.9–10.3)
GFR, EST AFRICAN AMERICAN: 48 mL/min — AB (ref >60)
GFR, EST NON AFRICAN AMERICAN: 42 mL/min — AB (ref >60)
Glucose, Bld: 130 mg/dL — ABNORMAL HIGH (ref 70–99)
Phosphorus: 2 mg/dL — ABNORMAL LOW (ref 2.5–4.6)
Potassium: 3.2 mmol/L — ABNORMAL LOW (ref 3.5–5.1)
Sodium: 141 mmol/L (ref 135–145)

## 2018-06-16 LAB — MAGNESIUM: MAGNESIUM: 2 mg/dL (ref 1.7–2.4)

## 2018-06-16 MED ORDER — HEPARIN SODIUM (PORCINE) 1000 UNIT/ML DIALYSIS
1000.0000 [IU] | INTRAMUSCULAR | Status: DC | PRN
  Administered 2018-06-16: 2800 [IU] via INTRAVENOUS_CENTRAL
  Filled 2018-06-16: qty 6
  Filled 2018-06-16: qty 3
  Filled 2018-06-16: qty 6

## 2018-06-16 MED ORDER — SODIUM CHLORIDE 0.9 % IV SOLN
INTRAVENOUS | Status: DC
  Administered 2018-06-16: 10 mL/h via INTRAVENOUS

## 2018-06-16 MED ORDER — CHLORHEXIDINE GLUCONATE CLOTH 2 % EX PADS: 6.0000 | MEDICATED_PAD | Freq: Every day | CUTANEOUS | Status: DC

## 2018-06-16 NOTE — Progress Notes (Signed)
Social Note:  We appreciate the great care provided by CCM. Family medicine will continue to follow along.  Peggyann ShoalsHannah Finnian Husted, DO [facility], PGY-1 06/16/2018 7:06 AM

## 2018-06-16 NOTE — Progress Notes (Addendum)
PULMONARY / CRITICAL CARE MEDICINE   NAME:  [name], [name], [name], [name], [name] ADMISSION DATE:  04/29/2018, CONSULTATION DATE:  06/10/2018 REFERRING MD:  Gwendolyn GrantWalden, CHIEF COMPLAINT:  Cardiac arrest  BRIEF HISTORY:    This is a 52 y.o.malewith hx severe CAD, AS, sCHF (echo from Aug 2019 with EF 15-20%), HIV on HAART, ESRD on HD (for almost 20 years). Initially admitted 8/23 with NSTEMI. On 8/[name], had cath that showed multivessel disease; therefore, on 9/3, had PCI to LAD and was also being worked up for a TAVR that he was scheduled to receive 09/[name]/2019. On 9/9 had brief PEA arrest with 2mins CPR thought r/t 3rd degree block. Had recurrent arrest later that night requiring transcutaneous pacing. He remained conscious following both episodes but had progressive respiratory failure r/t pulmonary edema requiring intubation.  Went for temp Temp Pacemaker on 9/9, returned to ICU CRRT started. Requiring high PEEP/FIO2 as well as high dose vasopressors for cardiogenic shock.  9/10: still positive volume status. WBCs remain up. FiO2 and PEEP are improving.  9/12 volume removal with CVVHD limited by hypotension/pressor needs. Plts trending down->36 9/13: PEEP, FiO2, pressor requirements all improved. Sputum cultures demonstrating Klebsiella and B strep, antibiotics changed to ceftriaxone monotherapy.  9/[name]: Successfully extubated.  9/15 tolerating intermittent dialysis, on low-dose pressors more awake and alert 9/16: Awake, alert no distress weaning pressors still.  Placed back on CRRT,midorine started 9/17: Acutely delirious following Ambien.  Remains on CRRT.  Increasing midodrine  9/18: Delirium improved some.  Now off pressors but remains hypotensive with systolic pressure in the 70s on maximum dose midodrine.  Discussion with thoracic surgery, cardiology, and electrophysiology.  Deemed not a candidate for pacemaker, and therefore no longer a candidate for TAVR.  Transitioning  to more palliative approach. STUDIES:   No recent CULTURES:  9/10: Sputum culture: Few group B strep, few Klebsiella pneumoniae  ANTIBIOTICS:  HAART Vanc 9/10>>> 9/13 Cefepime 9/10>>> 9/13 Ceftriaxone 9/13:  Procedures::  R/LHC 8/[name] > 1st  diag lesion 100% stenosed, prox LAD to mid LAD 95% stenosed, 2nd diag lesion 99% stenosed, 1st Mrg lesion 90% stenosed, prox RCA to mid RCA 20% stenosed, mid RCA lesion 20% stenosed. 9/9 intubated>>> 9/[name]  CONSULTANTS:  Cardiology 8/23 >  Nephrology 8/24 >  CVTS 8/27 >  evaluated in follow-up consultation 9/17: Felt unlikely to improve with TAVR or permanent pacemaker.  Deemed not a candidate  SUBJECTIVE:  Denies pain, or shortness of breath  CONSTITUTIONAL: Blood Pressure (Abnormal) 78/60 (BP Location: Right Arm)   Pulse 77   Temperature 97.9 F (36.6 C) (Axillary)   Respiration 16   Height 5\' 11"  (1.803 m)   Weight 63 kg   Oxygen Saturation 93%   Body Mass Index 19.37 kg/m   I/O last 3 completed shifts: In: 3085.3 [I.V.:2734.3; IV Piggyback:351.1] Out: 1139 [Other:1139]        PHYSICAL EXAM: General: This is a 52 year old African-American male he is currently resting comfortably in bed.  He remains mildly encephalopathic but improved in comparison to the day prior HEENT normocephalic atraumatic no jugular venous distention Pulmonary: Faint crackles bases no accessory use Cardiac: Regular rate and rhythm.  Systolic murmur consistent with aortic stenosis is appreciated Abdomen: Soft nontender Extremities: Left foot is ischemic, appears to have dry gangrenous changes Neuro: Awake, confused, moves all extremities GU: An uric  RESOLVED PROBLEM LIST  Acute respiratory failure Cardiac arrest Ileus Complete heart block Pneumonia (completed 7 d rx on 9/17)  ASSESSMENT AND PLAN    Status post PEA cardiac arrest secondary to complete heart block -Needs permanent pacemaker, challenges that he is end-stage renal disease and  risk for lead infection also radiographically it appears as though there right venous system is actually stenosed/thrombosed making it accessible Plan Continue telemetry monitoring  Non-ST elevation MI, status post PCI and arthrectomy from mid LAD on 9/3 Plan Cont asa plavix and statin  Heart failure with reduced ejection fraction EF 25 to 30%, further complicated by severe aortic stenosis Cardiogenic shock -Needs TAVR;  not a candidate for surgical replacement -Pressor requirements improved Plan Cont midodrine 10mg  tid No longer a candidate for TAVR given prolonged hypotension  Acute toxic encephalopathy secondary to Ambien Plan Continuing supportive care  Ischemic left foot Plan Supportive care  End-stage renal disease Plan Transition to intermittent dialysis on 9/19  Intermittent fluid and electrolyte imbalance: hypokalemia  Plan Replace K  Recheck labs am   Mild anemia without evidence of bleeding Plan Trend cbc; transfuse for hgb <7  Thrombocytopenia -Platelet count is improving Plan Trend CBC  HIV Plan Cont ART therapy   Hyper and hypoglycemia.  This had gotten better now elevated again  plan Sliding scale insulin   SUMMARY OF TODAY'S PLAN:   Long discussion with all services including cardiology, nephrology, and then back with the patient's spouse.  Unfortunately he is not a candidate for TAVR given his persistent shock state and hypotension.  He is not a candidate for permanent pacemaker given lack of venous access identified by cardiac CT. and TAVR cannot be done safely without permanent pacemaker.  Ultimately there is nothing we can offer this gentleman.  At this point he remains on CRRT and I am not certain that he can tolerate intermittent dialysis.  I discussed all these concerns with his wife.  At this point the plan is to make Mr. Isabell a DO NOT RESUSCITATE.  We will no longer escalate care.  We have taken TAVR and pacemaker off the table as  potential therapies.  She understands that he is terminally ill.  We will discontinue CRRT today, and try intermittent dialysis tomorrow.  If he does not tolerate this will have to transition to comfort care sooner.  I told the spouse I have consulted palliative care who also assist [place] with supporting her and the patient, will continue supportive care as plan outlined above  Best Practice / Goals of Care / Disposition.   DVT PROPHYLAXIS:SCD NUTRITION:Advance diet MOBILITY:BR, can advance once temporary pacer removed FAMILY DISCUSSIONS: Discussed with wife at bedside CODE STATUS: DO NOT RESUSCITATE Disposition: Keep in intensive care for now, transitioning to intermittent dialysis.  Will ask family medicine to reassume care  LABS  Glucose Recent Labs  Lab 06/14/18 1622 06/14/18 2018 06/15/18 0814 06/15/18 1217 06/15/18 1550 06/16/18 0648  GLUCAP 171* 126* 190* 142* 116* 113*    BMET Recent Labs  Lab 06/15/18 0356  06/15/18 1540  06/16/18 0506 06/16/18 0604 06/16/18 0809 06/16/18 0818  NA 139  139   < > 141   < > 141 140 140 140  K 3.4*  3.5   < > 3.4*   < > 3.2* 3.3* 3.5 3.1*  CL 90*  92*   < > 91*   < > 88* 87* 87* 89*  CO2 29  --  35*  --  35*  --   --   --   BUN 11  12   < > 9   < >  10 7 8 10   CREATININE 1.50*  1.86*   < > 1.80*   < > 1.82* 1.00 1.10 1.40*  GLUCOSE 177*  157*   < > 124*   < > 130* 198* 204* 124*   < > = values in this interval not displayed.    Liver Enzymes Recent Labs  Lab 06/15/18 0356 06/15/18 1540 06/16/18 0506  ALBUMIN 2.0* 1.9* 1.9*    Electrolytes Recent Labs  Lab 06/14/18 0349  06/15/18 0356 06/15/18 1540 06/16/18 0506  CALCIUM 8.2*   < > 9.5 9.6 10.0  MG 2.6*  --  2.2  --  2.0  PHOS 1.3*   < > 2.6 2.2* 2.0*   < > = values in this interval not displayed.    CBC Recent Labs  Lab 06/14/18 0345  06/15/18 0356  06/16/18 0506 06/16/18 0604 06/16/18 0809 06/16/18 0818  WBC 9.7  --  11.5*  --  6.9  --   --   --   HGB  8.3*   < > 8.8*  7.7*   < > 7.5* 8.5* 8.2* 8.5*  HCT [name].6*   < > [name].0*  24.4*   < > 24.3* 25.0* 24.0* 25.0*  PLT 44*  --  73*  --  74*  --   --   --    < > = values in this interval not displayed.    ABG Recent Labs  Lab 06/10/18 0416  PHART 7.503*  PCO2ART 32.9  PO2ART 244.0*    Coag's Recent Labs  Lab 06/10/18 0415  APTT 52*    Sepsis Markers No results for input(s): LATICACIDVEN, PROCALCITON, O2SATVEN in the last 168 hours.  Cardiac Enzymes No results for input(s): TROPONINI, PROBNP in the last 168 hours.   Simonne Martinet ACNP-BC [facility] Pulmonary/Critical Care Pager # [phone] OR # [phone] if no answer

## 2018-06-16 NOTE — Progress Notes (Signed)
Palliative Medicine consult noted. Due to high referral volume, there may be a delay seeing this patient. Please call the Palliative Medicine Team office at [phone] if recommendations are needed in the interim.  Thank you for inviting us to see this patient.  Margret ChanceMelanie G. Broox Lonigro, RN, BSN, [facility] Pall[facility] Medicine Team 06/16/2018 8:31 AM Office [phone]

## 2018-06-16 NOTE — Plan of Care (Signed)
  Problem: Education: Goal: Knowledge of General Education information will improve Description: Including pain rating scale, medication(s)/side effects and non-pharmacologic comfort measures Outcome: Progressing   Problem: Health Behavior/Discharge Planning: Goal: Ability to manage health-related needs will improve Outcome: Progressing   Problem: Clinical Measurements: Goal: Respiratory complications will improve Outcome: Progressing Goal: Cardiovascular complication will be avoided Outcome: Progressing   Problem: Pain Managment: Goal: General experience of comfort will improve Outcome: Progressing   

## 2018-06-16 NOTE — Consult Note (Signed)
Consultation Note Date: 06/16/2018   Patient Name: [name]  DOB: 1966-05-18  MRN: [id number]  Age / Sex: 52 y.o., male  PCP: Patient, No Pcp Per Referring Physician: Alyson Reedy, MD  Reason for Consultation: Establishing goals of care and Psychosocial/spiritual support  HPI/Patient Profile: 52 y.o. male  with past medical history of ESRD on HD for 19 years, HIV infection, PNE, DJD, Anemia of chronic disease, smoker, GERD, PUD admitted on 05/18/2018 with NSTEMI, now not tolerating HD.   Clinical Assessment and Goals of Care: Mr. Clinkscale is lying in bed, he appears acutely/chronically ill.  He will briefly make, but not keep eye contact.  Present at bedside is wife of 4 years [name]. She has been staying in the hospital since he hs been in ICU, but prior to that had been working part time.   We go to another area for family discussion at [facility] request.  We talk about stopping CVVHD today, and having a trial of regular HD.  I share my worry about Mr. Mccarley ability to tolerate regular HD.  We talk about how to continue to care for him if he cannot tolerate HD.  [name] understands that without HD time would be short, 7-10 days.   We talk about preferred place of death.  [name] shares that Mr. Karapetyan wants to be cremated, and they would joke about carrying each others ashes around.  We talk about residential hospice and the expert care he would receive there.  [name] is tearful when I share that she can stay with Mr. Overley 24/7.  [name] tells me they are homeless at this time, staying in hotels when they can afford and otherwise sleeping in their car.   [name] tells me that Mr. Jafri sister is coming from [place], his father lives in [place] and is coming to the hospital, her son from [place] is coming soon.   Conversation with CCM NP Tanja Port rt plan of care and GOC discussions.    Conversation with SW rt plan of care, GOC discussions, homelessness, residential hospice option.   HCPOA NEXT OF KIN -  Wife of 4 years, Dakhari Zuver.  He has no children, but jokes that he has "rent-a-kids" with her two sons aged 76 and 75.     SUMMARY OF RECOMMENDATIONS   Off CVVHD today.  Trial of regular HD, if unable to tolerate expect transition to comfort care and residential hospice.   Code Status/Advance Care Planning:  DNR - [name] confirms that we are to allow a natural death.  I ask if they have had these discussions in the past.  She tells me that they would sometime joke about being cremated and carrying each others ashes.  I share that the medical team supports DNR.  I consider if Mr. Nishi is able to wake, that we would need to ask for his preference.  I share with [name] that this would not be her responsibility, but the medical team.   Symptom Management:   Per hospitalist/CCM, no additional  needs at this time.   Palliative Prophylaxis:   Frequent Pain Assessment and Turn Reposition  Additional Recommendations (Limitations, Scope, Preferences):  continue to treat the treatable, no CPR, no intubation.   Psycho-social/Spiritual:   Desire for further Chaplaincy support:no  Additional Recommendations: Caregiving  Support/Resources and Education on Hospice  Prognosis:   < 2 weeks would not be surprising based on frailty, poor functional status, not tolerating HD, poor heart function with maximized medical treatment.   Discharge Planning: to be determined, in hospital death vs residential hospice if unable to tolerated HD.       Primary Diagnoses: Present on Admission: . NSTEMI (non-ST elevated myocardial infarction) (HCC) . Acute on chronic systolic heart failure (HCC) . Aortic insufficiency . HIV (human immunodeficiency virus infection) (HCC)   I have reviewed the medical record, interviewed the patient and family, and examined the patient. The  following aspects are pertinent.  Past Medical History:  Diagnosis Date  . Anal condyloma   . Anemia   . Aortic stenosis    a. 12/2016 Echo: EF 55-60%, no rwma, possibly bicuspid AoV, mod thickened, mod Ca2+. Fusion of R-L coronary commissure. Cusp separatino mod reduced. Mild AI. Peak velocity (S) 245 cm/s. Mean gradient (S) .   Marland Kitchen Coronary artery disease   . Diarrhea    occurs because of dialysis  . DJD (degenerative joint disease)    right knee.  All joints  . ESRD (end stage renal disease) on dialysis (HCC) 01/02/2012   Gets diaysis at [facility] on [employer], TTS schedule.  Started dialysis in 2000.     . Family history of disseminated HSV infection   . Genital herpes   . Hemodialysis patient [facility])    Tuesday, Thursday, Saturday  . History of blood transfusion   . HIV infection (HCC)   . Hypertension   . NSTEMI (non-ST elevation myocardial infarction) (HCC)   . Tertiary syphilis   . 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 Reclotted on 03/02/13 and IR placed L IJ tunneled HD catheter (a right external jugular tunneled HD cath was attempted first but did not function due to kinking across the clavicle). Pt was seen by [facility] on same admission 6/614 and said RUA AVF no longer usable, they ordered vein mapping and will see him as outpatient to set up next permanent access.      Social History   Socioeconomic History  . Marital status: Married    Spouse name: Not on file  . Number of children: Not on file  . Years of education: Not on file  . Highest education level: Not on file  Occupational History  . Not on file  Social Needs  . Financial resource strain: Not on file  . Food insecurity:    Worry: Not on file    Inability: Not on file  . Transportation needs:    Medical: Not on file    Non-medical: Not on file  Tobacco Use  . Smoking status: Heavy Tobacco Smoker    Packs/day: 0.50    Years: 27.00     Pack years: 13.50    Types: Cigarettes  . Smokeless tobacco: Never Used  . Tobacco comment: currently smoking ~ 1/3 ppd.  Substance and Sexual Activity  . Alcohol use: No    Alcohol/week: 0.0 standard drinks    Comment: 03/04/2013 "stopped drinking ~ 12 yr ago; never had problem w/it".    Marland Kitchen  Drug use: No  . Sexual activity: Not Currently    Partners: Female    Birth control/protection: Condom    Comment: declined condoms  Lifestyle  . Physical activity:    Days per week: Not on file    Minutes per session: Not on file  . Stress: Not on file  Relationships  . Social connections:    Talks on phone: Not on file    Gets together: Not on file    Attends religious service: Not on file    Active member of club or organization: Not on file    Attends meetings of clubs or organizations: Not on file    Relationship status: Not on file  Other Topics Concern  . Not on file  Social History Narrative   [place].   Family History  Problem Relation Age of Onset  . Diabetes Mother   . Arthritis Father        Gout- Foot   Scheduled Meds: . aspirin  81 mg Oral Daily  . clopidogrel  75 mg Oral Daily  . darbepoetin (ARANESP) injection - NON-DIALYSIS  150 mcg Subcutaneous Q Fri  . darunavir-cobicistat  1 tablet Oral Q breakfast  . dolutegravir  50 mg Oral Q supper  . feeding supplement (ENSURE ENLIVE)  237 mL Oral TID BM  . feeding supplement (PRO-STAT SUGAR FREE 64)  30 mL Oral TID WC  . gabapentin  100 mg Oral Daily  . hydrocortisone sodium succinate  50 mg Intravenous Q6H  . mouth rinse  15 mL Mouth Rinse BID  . midodrine  10 mg Oral TID WC  . multivitamin  1 tablet Oral QHS  . rosuvastatin  10 mg Oral q1800  . sodium chloride flush  3 mL Intravenous Q12H  . tenofovir  300 mg Oral Weekly   Continuous Infusions: . sodium chloride 250 mL (06/16/18 1305)  . famotidine (PEPCID) IV 20 mg (06/15/18 2205)  . ferric gluconate (FERRLECIT/NULECIT) IV Stopped (06/05/18 1531)  .  norepinephrine (LEVOPHED) Adult infusion Stopped (06/15/18 1206)  . sodium chloride     PRN Meds:.sodium chloride, acetaminophen, Gerhardt's butt cream, heparin, ipratropium-albuterol, midazolam, nitroGLYCERIN, ondansetron (ZOFRAN) IV, polyvinyl alcohol, sodium chloride, sodium chloride flush Medications Prior to Admission:  Prior to Admission medications   Medication Sig Start Date End Date Taking? Authorizing Provider  albuterol (PROVENTIL HFA;VENTOLIN HFA) 108 (90 Base) MCG/ACT inhaler Inhale 2 puffs into the lungs every 6 (six) hours as needed for wheezing or shortness of breath.   Yes [provider]  calcium acetate (PHOSLO) 667 MG capsule Take 2,668 mg by mouth See admin instructions. Take 2,668 mg by mouth three times a day with meals and 2,668 mg with each snack   Yes [provider]  darunavir (PREZISTA) 800 MG tablet Take 1 tablet (800 mg total) by mouth at bedtime. 02/08/18  Yes Cliffton Astersampbell, John, MD  ibuprofen (ADVIL,MOTRIN) 200 MG tablet Take 600 mg by mouth every 6 (six) hours as needed (for pain or headaches).    Yes [provider]  Multiple Vitamins-Minerals (MULTIVITAMIN PO) Take 1 tablet by mouth daily.   Yes [provider]  ritonavir (NORVIR) 100 MG TABS tablet TAKE 1 TABLET BY MOUTH AT BEDTIME. TAKE WITH PREZISTA TABLET 02/08/18  Yes Cliffton Astersampbell, John, MD  tenofovir (VIREAD) 300 MG tablet TAKE 1 TABLET BY MOUTH ONCE A WEEK ON SUNDAYS AT BEDTIME 02/08/18  Yes Cliffton Astersampbell, John, MD  TIVICAY 50 MG tablet TAKE 1 TABLET(50 MG) BY  MOUTH AT BEDTIME 02/08/18  Yes Cliffton [name], MD  acyclovir (ZOVIRAX) 400 MG tablet Take 400 mg tablet in evening after each HD session for the next week. Patient not taking: Reported on 05/17/2018 10/10/17   Arvilla Market, DO  darunavir (PREZISTA) 800 MG tablet TAKE 1 TABLET BY MOUTH AT BEDTIME WITH NORVIR Patient not taking: Reported on 05/17/2018 10/22/17   Cliffton [name], MD  docusate sodium (COLACE) 100 MG capsule  Take 1 capsule (100 mg total) by mouth daily. Patient not taking: Reported on 05/18/2018 12/10/16   Renne Musca, MD  dolutegravir (TIVICAY) 50 MG tablet Take 1 tablet (50 mg total) by mouth at bedtime. Patient not taking: Reported on 05/08/2018 10/22/17   Cliffton [name], MD  hydrOXYzine (ATARAX/VISTARIL) 25 MG tablet Take 1 tablet (25 mg total) by mouth every 6 (six) hours as needed for itching. Patient not taking: Reported on 05/10/2018 10/28/17   Wendee Beavers, DO  pantoprazole (PROTONIX) 40 MG tablet TAKE 1 TABLET BY MOUTH TWICE DAILY Patient not taking: Reported on 05/29/2018 01/08/17   Almon Hercules, MD   Allergies  Allergen Reactions  . Iodinated Diagnostic Agents Hives, Itching and Rash    "skin rash - severe" per outside records  . Penicillins Hives and Other (See Comments)    Tolerated Cefepime 1/17 Rx required Hospitalization Has patient had a PCN reaction causing immediate rash, facial/tongue/throat swelling, SOB or lightheadedness with hypotension: Yes Has patient had a PCN reaction causing severe rash involving mucus membranes or skin necrosis: No Has patient had a PCN reaction that required hospitalization Yes Has patient had a PCN reaction occurring within the last 10 years: No   . Shellfish-Derived Products Nausea Only    Irritates the stomach  . Acetaminophen-Codeine Nausea And Vomiting  . Tape Rash and Other (See Comments)    Use paper tape only (NO CLEAR PLASTIC TAPE!!)   Review of Systems  Unable to perform ROS: Acuity of condition    Physical Exam  Vital Signs: BP 98/85   Pulse 71   Temp 97.6 F (36.4 C) (Axillary)   Resp 16   Ht 5\' 11"  (1.803 m)   Wt 63 kg   SpO2 94%   BMI 19.37 kg/m  Pain Scale: 0-10 POSS *See Group Information*: S-Acceptable,Sleep, easy to arouse Pain Score: 0-No pain   SpO2: SpO2: 94 % O2 Device:SpO2: 94 % O2 Flow Rate: .O2 Flow Rate (L/min): 2 L/min  IO: Intake/output summary:   Intake/Output Summary (Last 24 hours) at  06/16/2018 1414 Last data filed at 06/16/2018 1300 Gross per 24 hour  Intake 2047 ml  Output 691 ml  Net 1356 ml    LBM: Last BM Date: 06/15/18 Baseline Weight: Weight: 66.9 kg Most recent weight: Weight: 63 kg     Palliative Assessment/Data:   Flowsheet Rows     Most Recent Value  Intake Tab  Referral Department  Hospitalist  Unit at Time of Referral  ICU  Palliative Care Primary Diagnosis  Cardiac  Date Notified  06/15/18  Palliative Care Type  New Palliative care  Reason for referral  Clarify Goals of Care, Psychosocial or Spiritual support  Date of Admission  05/22/2018  Date first seen by Palliative Care  06/16/18  # of days Palliative referral response time  1 Day(s)  # of days IP prior to Palliative referral  25  Clinical Assessment  Palliative Performance Scale Score  20%  Pain Max last 24 hours  Not able to report  Pain Min Last 24 hours  Not able to report  Dyspnea Max Last 24 Hours  Not able to report  Dyspnea Min Last 24 hours  Not able to report  Psychosocial & Spiritual Assessment  Palliative Care Outcomes  Patient/Family meeting held?  Yes  Who was at the meeting?  wife [name] at bedside  Palliative Care Outcomes  Provided advance care planning, Provided end of life care assistance, Clarified goals of care, Provided psychosocial or spiritual support, Counseled regarding hospice  Patient/Family wishes: Interventions discontinued/not started   Mechanical Ventilation      Time In: 0920 Time Out: 1035 Time Total: 75 minutes Greater than 50%  of this time was spent counseling and coordinating care related to the above assessment and plan.  Signed by: Katheran Awe, NP   Please contact Palliative Medicine Team phone at [phone] for questions and concerns.  For individual provider: See Loretha Stapler

## 2018-06-16 NOTE — Progress Notes (Signed)
CRRT discontinued, 186ml returned to pt. Heparin instilled into dialysis catheter 1000units/ml.  Norva KarvonenPhillip M Okie Bogacz, RN

## 2018-06-16 NOTE — Progress Notes (Signed)
Progress Note  Patient Name: [name] Date of Encounter: 06/16/2018  Primary Cardiologist: Thurmon FairMihai Revere Maahs, MD   Subjective   Subdued, but alert and coherent. Moderately hypotensive, off IV pressors.  Inpatient Medications    Scheduled Meds: . aspirin  81 mg Oral Daily  . clopidogrel  75 mg Oral Daily  . darbepoetin (ARANESP) injection - NON-DIALYSIS  150 mcg Subcutaneous Q Fri  . darunavir-cobicistat  1 tablet Oral Q breakfast  . dolutegravir  50 mg Oral Q supper  . feeding supplement (ENSURE ENLIVE)  237 mL Oral TID BM  . feeding supplement (PRO-STAT SUGAR FREE 64)  30 mL Oral TID WC  . gabapentin  100 mg Oral Daily  . hydrocortisone sodium succinate  50 mg Intravenous Q6H  . mouth rinse  15 mL Mouth Rinse BID  . midodrine  10 mg Oral TID WC  . multivitamin  1 tablet Oral QHS  . rosuvastatin  10 mg Oral q1800  . sodium chloride flush  3 mL Intravenous Q12H  . tenofovir  300 mg Oral Weekly   Continuous Infusions: . sodium chloride 10 mL/hr at 06/16/18 1500  . sodium chloride    . famotidine (PEPCID) IV 20 mg (06/15/18 2205)  . ferric gluconate (FERRLECIT/NULECIT) IV Stopped (06/05/18 1531)  . norepinephrine (LEVOPHED) Adult infusion Stopped (06/15/18 1206)  . sodium chloride     PRN Meds: sodium chloride, acetaminophen, Gerhardt's butt cream, heparin, ipratropium-albuterol, midazolam, nitroGLYCERIN, ondansetron (ZOFRAN) IV, polyvinyl alcohol, sodium chloride, sodium chloride flush   Vital Signs    Vitals:   06/16/18 1500 06/16/18 1600 06/16/18 1634 06/16/18 1700  BP: (!) 88/62   (!) 77/59  Pulse: 75 77  76  Resp:      Temp:   98.3 F (36.8 C)   TempSrc:   Axillary   SpO2: 92% 93%  97%  Weight:      Height:        Intake/Output Summary (Last 24 hours) at 06/16/2018 1729 Last data filed at 06/16/2018 1500 Gross per 24 hour  Intake 1856.57 ml  Output 622 ml  Net 1234.57 ml   Filed Weights   06/14/18 0500 06/15/18 0500 06/16/18 0500  Weight:  63.3 kg 64 kg 63 kg    Telemetry    NSR - Personally Reviewed  ECG    No new tracing - Personally Reviewed  Physical Exam  Appears weak GEN: No acute distress.   Neck: 7-8 cm JVD Cardiac: RRR, 2/6 aortic systolic and diastolic murmurs, no rubs or gallops.  Respiratory: Clear to auscultation bilaterally. GI: Soft, nontender, non-distended  MS: No edema; No deformity. Neuro:  Nonfocal  Psych: Normal affect   Labs    Chemistry Recent Labs  Lab 06/15/18 0356  06/15/18 1540  06/16/18 0506  06/16/18 0818 06/16/18 1010 06/16/18 1016  NA 139  139   < > 141   < > 141   < > 140 140 140  K 3.4*  3.5   < > 3.4*   < > 3.2*   < > 3.1* 3.3* 3.2*  CL 90*  92*   < > 91*   < > 88*   < > 89* 87* 90*  CO2 29  --  35*  --  35*  --   --   --   --   GLUCOSE 177*  157*   < > 124*   < > 130*   < > 124* 216* 145*  BUN 11  12   < >  9   < > 10   < > 10 6 9   CREATININE 1.50*  1.86*   < > 1.80*   < > 1.82*   < > 1.40* 1.10 1.50*  CALCIUM 9.5  --  9.6  --  10.0  --   --   --   --   ALBUMIN 2.0*  --  1.9*  --  1.9*  --   --   --   --   GFRNONAA 41*  --  42*  --  42*  --   --   --   --   GFRAA 47*  --  49*  --  48*  --   --   --   --   ANIONGAP 18*  --  15  --  18*  --   --   --   --    < > = values in this interval not displayed.     Hematology Recent Labs  Lab 06/14/18 0345  06/15/18 0356  06/16/18 0506  06/16/18 0818 06/16/18 1010 06/16/18 1016  WBC 9.7  --  11.5*  --  6.9  --   --   --   --   RBC 2.47*  --  2.28*  --  2.23*  --   --   --   --   HGB 8.3*   < > 8.8*  7.7*   < > 7.5*   < > 8.5* 8.2* 7.8*  HCT 26.6*   < > 26.0*  24.4*   < > 24.3*   < > 25.0* 24.0* 23.0*  MCV 107.7*  --  107.0*  --  109.0*  --   --   --   --   MCH 33.6  --  33.8  --  33.6  --   --   --   --   MCHC 31.2  --  31.6  --  30.9  --   --   --   --   RDW 17.7*  --  17.5*  --  17.5*  --   --   --   --   PLT 44*  --  73*  --  74*  --   --   --   --    < > = values in this interval not displayed.     Cardiac EnzymesNo results for input(s): TROPONINI in the last 168 hours. No results for input(s): TROPIPOC in the last 168 hours.   BNPNo results for input(s): BNP, PROBNP in the last 168 hours.   DDimer No results for input(s): DDIMER in the last 168 hours.   Radiology    Dg Chest Port 1 View  Result Date: 06/16/2018 CLINICAL DATA:  Shortness of breath.  Respiratory failure. EXAM: PORTABLE CHEST 1 VIEW COMPARISON:  One-view chest x-ray 06/15/2018 FINDINGS: The heart is enlarged. Atherosclerotic calcifications are present at the aortic arch. Previously noted femoral pacing lead was removed. Progressive interstitial and airspace disease is present, asymmetric on the right. The right pleural effusion is now present. Vascular stents are present of the subjacent to the left clavicle. IMPRESSION: Asymmetric right interstitial and airspace disease likely reflects asymmetric edema associated with the fusion. Infection is not excluded Interval removal of femoral pacing wire Electronically Signed   By: Marin Roberts M.D.   On: 06/16/2018 10:42   Dg Chest Port 1 View  Result Date: 06/15/2018 CLINICAL DATA:  52 year old with pneumonia. End-stage renal disease. Recent placement of a temporary  transvenous pacemaker. EXAM: PORTABLE CHEST 1 VIEW COMPARISON:  06/11/2018 FINDINGS: Endotracheal tube and nasogastric tube have been removed. Again noted are vascular stents in both arms and left upper chest. Increased hazy lung densities particularly in the right upper lung. Heart size is mildly enlarged but stable. Cardiac lead has been pulled back and the tip is in the right atrium region. IMPRESSION: Increased hazy densities in both lungs, particularly in the right upper lung. Differential diagnosis is asymmetric pulmonary edema versus atypical infection. The femoral temporary pacemaker lead has changed in position. The lead is now in the right atrium. This finding was discussed with the patient's nurse,  Tonya on 06/15/2018 at 0927 hours. Electronically Signed   By: Richarda Overlie M.D.   On: 06/15/2018 09:28    Cardiac Studies   PCI 06/06/2018  Prox LAD lesion is 99% stenosed.  Post intervention, there is a 0% residual stenosis.  A drug-eluting stent was successfully placed.  1. Severe, heavily calcified stenosis in the mid LAD 2. Successful PTCA of the mid LAD with orbital atherectomy and stenting of the mid LAD.   Recommendations: Will continue DAPT with ASA and Plavix. Will continue planning for TAVR.  Recommend uninterrupted dual antiplatelet therapy with Aspirin 81mg  daily and Clopidogrel 75mg  dailyfor a minimum of 12 months (ACS - Class I recommendation)  TEE 06/14/2018 - Left ventricle: The cavity size was moderately dilated. Systolic function was severely reduced. The estimated ejection fractionwas in the range of 15% to 20%. Wall motion was normal; therewere no regional wall motion abnormalities. - Aortic valve: Trileaflet; severely thickened, moderatelycalcified leaflets; fusion of the left-noncoronary commissure.Cusp separation was reduced. Valve mobility was restricted. Therewas moderate stenosis. There was moderate to severeregurgitation. Peak velocity (S): 275 cm/s. Mean gradient (S): 19mm Hg. Valve area (VTI): 1.2 cm^2. Valve area (Vmax): 1.58 cm^2.Valve area (Vmean): 1.5 cm^2. Valve area by planimetry 0.79 cm^2. - Mitral valve: There was mild regurgitation. - Left atrium: No evidence of thrombus in the atrial cavity orappendage. No evidence of thrombus in the atrial cavity orappendage. - Right ventricle: The cavity size was normal. Wall thickness wasnormal. Systolic function was severely reduced. - Right atrium: No evidence of thrombus in the atrial cavity or appendage.  Impressions:  - Aortic stenosis is severe by planimetry but moderate by meangradient and peak velocity. Given the reduced systolicfunctionand severely calcified, consider low-flow,  low-gradient severeaortic stenosis.  Patient Profile     52 y.o. male 52 y.o. male with severe CAD (06/04/2018 atherectomy-DES forcalcifiedproximal LAD), low gradient severe aortic stenosis, moderate to severe aortic insufficiency, end-stage renal disease on hemodialysis for almost 20 years, HIV+,who was undergoing work-up for aortic valve replacement untilCHB-relatedcardiac arrests overnight 06/06/18, treated with transfemoral temporary pacing wire.  Extubated on 9/14.   Has not required pacing since 06/12/2018.  Transvenous pacing wire removed 06/15/2018.   On CRRT and intravenous norepinephrine since 06/06/2018  Assessment & Plan      1.  Cardiogenic shock: off norepi, but hypotensive on midodrine. 2. CHB:  Evidence of baseline conduction system disease, likely to progress to persistent heart block after TAVR. Discussed pacing options with 2 EP colleagues and Dr. Cornelius Moras. Consensus is that he needs PPM before TAVR and that if paced, he needs CRT due to very low EF. This make both a leadless RV pacemaker and an epicardial LV lead (both without AV synchrony or VV synchrony) suboptimal solutions. On my review of the CTA of chest, I think the R axillary and innominate veins are patent.  I believe the occluded vein is a hyperplastic basilica that was connected to his old AVF. Therefore, access options exist (the issue of increased infection risk still looms large) 2. AS/AI:Not a candidate for surgical replacement. Was in the process of TAVR work-up, interrupted by his episode of complete heart block.  If we cannot provide reliable pacing, he cannot undergo TAVR.  Likewise, if he remains hypotensive on CRRT he is not a TAVR candidate and we might have to proceed with palliative care. He will have to rally in the next few days to be considered for further additional care. 3.CAD: s/p PCI-DES LAD 06/10/2018,with plans for dual antiplatelet therapy with aspirin and clopidogrel for 12 months. 4.  WUJ:WJXBJYNW depressed left ventricular systolic function,presumed due to low gradient severe aortic stenosis plus/minus CAD.   Unable to receive beta-blockers or RAAS inhibitors due to hypotension. 5. ESRD:He has successfully survived through 19 years of hemodialysis. Increased risk of lead infection if a fresh transvenous pacemaker is placed. 6. GNF:AOZHYQMVH with meds, controlled, undetectable viral load9/10/2017.  He is critically ill with multiple organ system involvement.  Consultation with the critical care service and electrophysiology services was necessary today.  Ongoing complex medical decision making is required.  Total time spent in patient care 65 minutes.         For questions or updates, please contact [facility] Please consult [email or web address] for contact info under        Signed, Thurmon Fair, MD  06/16/2018, 5:29 PM

## 2018-06-16 NOTE — Progress Notes (Signed)
Subjective: Interval History: not much better,   Objective: Vital signs in last 24 hours: Temp:  [96.5 F (35.8 C)-97.9 F (36.6 C)] 97.9 F (36.6 C) (09/18 0804) Pulse Rate:  [72-81] 79 (09/18 1000) BP: (61-87)/(41-67) 81/66 (09/18 1100) SpO2:  [78 %-99 %] 92 % (09/18 1100) Weight:  [63 kg] 63 kg (09/18 0500) Weight change: -1 kg  Intake/Output from previous day: 09/17 0701 - 09/18 0700 In: 2041.7 [I.V.:1841.8; IV Piggyback:199.9] Out: 611  Intake/Output this shift: Total I/O In: 449.2 [P.O.:50; I.V.:399.2] Out: 189 [Other:189]  Gen: slow mentation, responds appropriately Resp: dec'd bilat bases, no rales or wheezing Cardio: S1, S2 normal  GI: liver down 6 cm , pos bos, soft Extremities: AVF LUA, Hd cath R groin, L forefoot blackened discoloration and blistering, R foot some new blackened areas not as bad as L foot  CXR 9/13 - clear  TTS [facility] 3.5h 66kg 2K/2.5Ca P4 LUE AVG Hep none -Hectorol 3 mcg IV TIW -Venofer 50mg  IV q weeks  -Mircera IV q 2 weeks last 8/20  -Ca acetate 4 tid qac--> stopped while hospitalized in light of hyperCa  Assessment/Plan: 1 ESRD - usual HD TTS.  Not doing well, remains in shock, BP 70's- 80's. Ischemic changes L>R foot.  Have d/w CCM and family, will dc CRRT and plan HD trial tomorrow, see if tolerates.  Overall prognosis very poor.  2 Shock - no better 3 AS stenosis - not TAVR candidate 4 Heart block - TV pacer out 5 Anemia check Fe 6 HPTH check 7 HIV 8 CAD sp LAD PCI   LOS: 24 days   Maree Krabbe 06/14/2018,10:56 AM  Lab Results: Recent Labs    06/15/18 0356  06/16/18 0506  06/16/18 1010 06/16/18 1016  WBC 11.5*  --  6.9  --   --   --   HGB 8.8*  7.7*   < > 7.5*   < > 8.2* 7.8*  HCT 26.0*  24.4*   < > 24.3*   < > 24.0* 23.0*  PLT 73*  --  74*  --   --   --    < > = values in this interval not displayed.   BMET:  Recent Labs    06/15/18 1540  06/16/18 0506  06/16/18 1010 06/16/18 1016  NA 141    < > 141   < > 140 140  K 3.4*   < > 3.2*   < > 3.3* 3.2*  CL 91*   < > 88*   < > 87* 90*  CO2 35*  --  35*  --   --   --   GLUCOSE 124*   < > 130*   < > 216* 145*  BUN 9   < > 10   < > 6 9  CREATININE 1.80*   < > 1.82*   < > 1.10 1.50*  CALCIUM 9.6  --  10.0  --   --   --    < > = values in this interval not displayed.   No results for input(s): PTH in the last 72 hours. Iron Studies:  No results for input(s): IRON, TIBC, TRANSFERRIN, FERRITIN in the last 72 hours.  Studies/Results: Dg Chest Port 1 View  Result Date: 06/16/2018 CLINICAL DATA:  Shortness of breath.  Respiratory failure. EXAM: PORTABLE CHEST 1 VIEW COMPARISON:  One-view chest x-ray 06/15/2018 FINDINGS: The heart is enlarged. Atherosclerotic calcifications are present at the aortic arch. Previously noted femoral pacing  lead was removed. Progressive interstitial and airspace disease is present, asymmetric on the right. The right pleural effusion is now present. Vascular stents are present of the subjacent to the left clavicle. IMPRESSION: Asymmetric right interstitial and airspace disease likely reflects asymmetric edema associated with the fusion. Infection is not excluded Interval removal of femoral pacing wire Electronically Signed   By: Marin Robertshristopher  Mattern M.D.   On: 06/16/2018 10:42   Dg Chest Port 1 View  Result Date: 06/15/2018 CLINICAL DATA:  52 year old with pneumonia. End-stage renal disease. Recent placement of a temporary transvenous pacemaker. EXAM: PORTABLE CHEST 1 VIEW COMPARISON:  06/11/2018 FINDINGS: Endotracheal tube and nasogastric tube have been removed. Again noted are vascular stents in both arms and left upper chest. Increased hazy lung densities particularly in the right upper lung. Heart size is mildly enlarged but stable. Cardiac lead has been pulled back and the tip is in the right atrium region. IMPRESSION: Increased hazy densities in both lungs, particularly in the right upper lung. Differential  diagnosis is asymmetric pulmonary edema versus atypical infection. The femoral temporary pacemaker lead has changed in position. The lead is now in the right atrium. This finding was discussed with the patient's nurse, Tonya on 06/15/2018 at 0927 hours. Electronically Signed   By: Richarda OverlieAdam  Henn M.D.   On: 06/15/2018 09:28

## 2018-06-17 LAB — CBC
HCT: 31.3 % — ABNORMAL LOW (ref 39.0–52.0)
Hemoglobin: 9.7 g/dL — ABNORMAL LOW (ref 13.0–17.0)
MCH: 33.9 pg (ref 26.0–34.0)
MCHC: 31 g/dL (ref 30.0–36.0)
MCV: 109.4 fL — AB (ref 78.0–100.0)
Platelets: 74 10*3/uL — ABNORMAL LOW (ref 150–400)
RBC: 2.86 MIL/uL — AB (ref 4.22–5.81)
RDW: 17.6 % — ABNORMAL HIGH (ref 11.5–15.5)
WBC: 10.2 10*3/uL (ref 4.0–10.5)

## 2018-06-17 LAB — GLUCOSE, CAPILLARY
Glucose-Capillary: 93 mg/dL (ref 70–99)
Glucose-Capillary: 72 mg/dL (ref 70–99)

## 2018-06-17 LAB — CALCIUM, IONIZED: CALCIUM, IONIZED, SERUM: 4.4 mg/dL — AB (ref 4.5–5.6)

## 2018-06-17 MED FILL — Medication: Qty: 1 | Status: AC

## 2018-06-21 ENCOUNTER — Inpatient Hospital Stay: Payer: Medicare Other | Admitting: Family Medicine

## 2018-06-23 ENCOUNTER — Encounter: Payer: Self-pay | Admitting: Thoracic Surgery (Cardiothoracic Vascular Surgery)

## 2018-06-29 NOTE — Progress Notes (Signed)
Family came out during shift report and asked if someone could take a look at the pt stating he was breathing strange. Pt was found to be agonal breathing and pulseless by palpation and doppler. Pt a DNR so no recessetation performed, MD and chaplain called for family support.

## 2018-06-29 NOTE — Progress Notes (Signed)
Pt with no apical pulse and asystole on monitor. Pt pronounced by this RN and MD. Family at bedside and support given.

## 2018-06-29 NOTE — Progress Notes (Signed)
Nurse paged chaplain for support as patient passed away.  Had prayer with patient's brother and with his wife and deceased.  Prayers for support and peace and comfort for family and for celebration of life of patient.  Patient's brother said he and wife had talked about some funeral things and he will speak with wife so nurse can have needed info.  Phebe CollaDonna S Cooper Stamp, Chaplain   06/27/2018 0800  Clinical Encounter Type  Visited With Patient;Family;Patient and family together  Visit Type Follow-up;Spiritual support;Death  Referral From Nurse  Consult/Referral To Chaplain  Spiritual Encounters  Spiritual Needs Prayer;Emotional;Grief support  Stress Factors  Patient Stress Factors None identified  Family Stress Factors Loss

## 2018-06-29 NOTE — Progress Notes (Signed)
I reviewed the patient's status and noticed that the patient was deceased. I visited the patient's room and his wife, [name], was at the bedside. I offered spiritual support and words of encouragement by reflecting on the words and experiences the patient/wife shared during our previous visits together. I lead in prayer before leaving the room. I also spoke with the patient's two brothers in the hallway before leaving the Unit. I offered words of encouragement to them also.     2018/09/13 0848  Clinical Encounter Type  Visited With Patient and family together  Visit Type Follow-up;Spiritual support;Death  Referral From Family  Consult/Referral To Chaplain  Spiritual Encounters  Spiritual Needs Prayer;Emotional;Grief support  Stress Factors  Patient Stress Factors None identified  Family Stress Factors Exhausted;Loss    Chaplain Dr Melvyn NovasMichael Kordelia Severin

## 2018-06-29 DEATH — deceased

## 2018-07-30 NOTE — Death Summary Note (Signed)
52 y.o.malewith hx severe CAD, AS, sCHF (echo from Aug 2019 with EF 15-20%), HIV on HAART, ESRD on HD (for almost 20 years). Initially admitted 8/23 with NSTEMI. On 8/26, had cath that showed multivessel disease; therefore, on 9/3, had PCI to LAD and was also being worked up for a TAVR that he was scheduled to receive 06/18/2018.  On 9/9 had brief PEA arrest with CPR thought r/t 3rd degree block. Had recurrent arrest later that night requiring transcutaneous pacing. He remained conscious following both episodes but had progressive respiratory failure r/t pulmonary edema requiring intubation.  Went for temp Temp Pacemaker on 9/9, returned to ICU CRRT started. Requiring high PEEP/FIO2 as well as high dose vasopressors for cardiogenic shock.  Found to have HCAP with Klebsiella and Group B strep.  Eventually extubated.  Unfortunately his hemodynamics got worse.  He was not surgical candidate for any cardiac intervention.  Plan to transition to comfort measures.  He was made DNR.  He expired on 06/20/2018 an 8:08 AM.  Final diagnoses: Complete heart block with PEA cardiac arrest NSTEMI Severe coronary artery disease Acute on chronic systolic CHF Severe aortic stenosis, aortic insufficiency Cardiogenic shock Acute metabolic encephalopathy Acute hypoxic respiratory failure HCAP with Klebsiella and Group B Streptococcus End stage renal disease Anemia and thrombocytopenia of critical illness Hx of HIV Hyperglycemia Secondary hyperparathyroidism  Coralyn Helling, MD [facility] Pulmonary/Critical Care 06/30/2018, 4:44 PM

## 2019-05-11 IMAGING — DX DG CHEST 1V PORT
1 series · 1 of 1 positions shown · non-contrast
Comparison: 06/07/2018 .

CLINICAL DATA: Shortness of breath.  Pacemaker complications.

EXAM:
PORTABLE CHEST 1 VIEW

[chest ap]
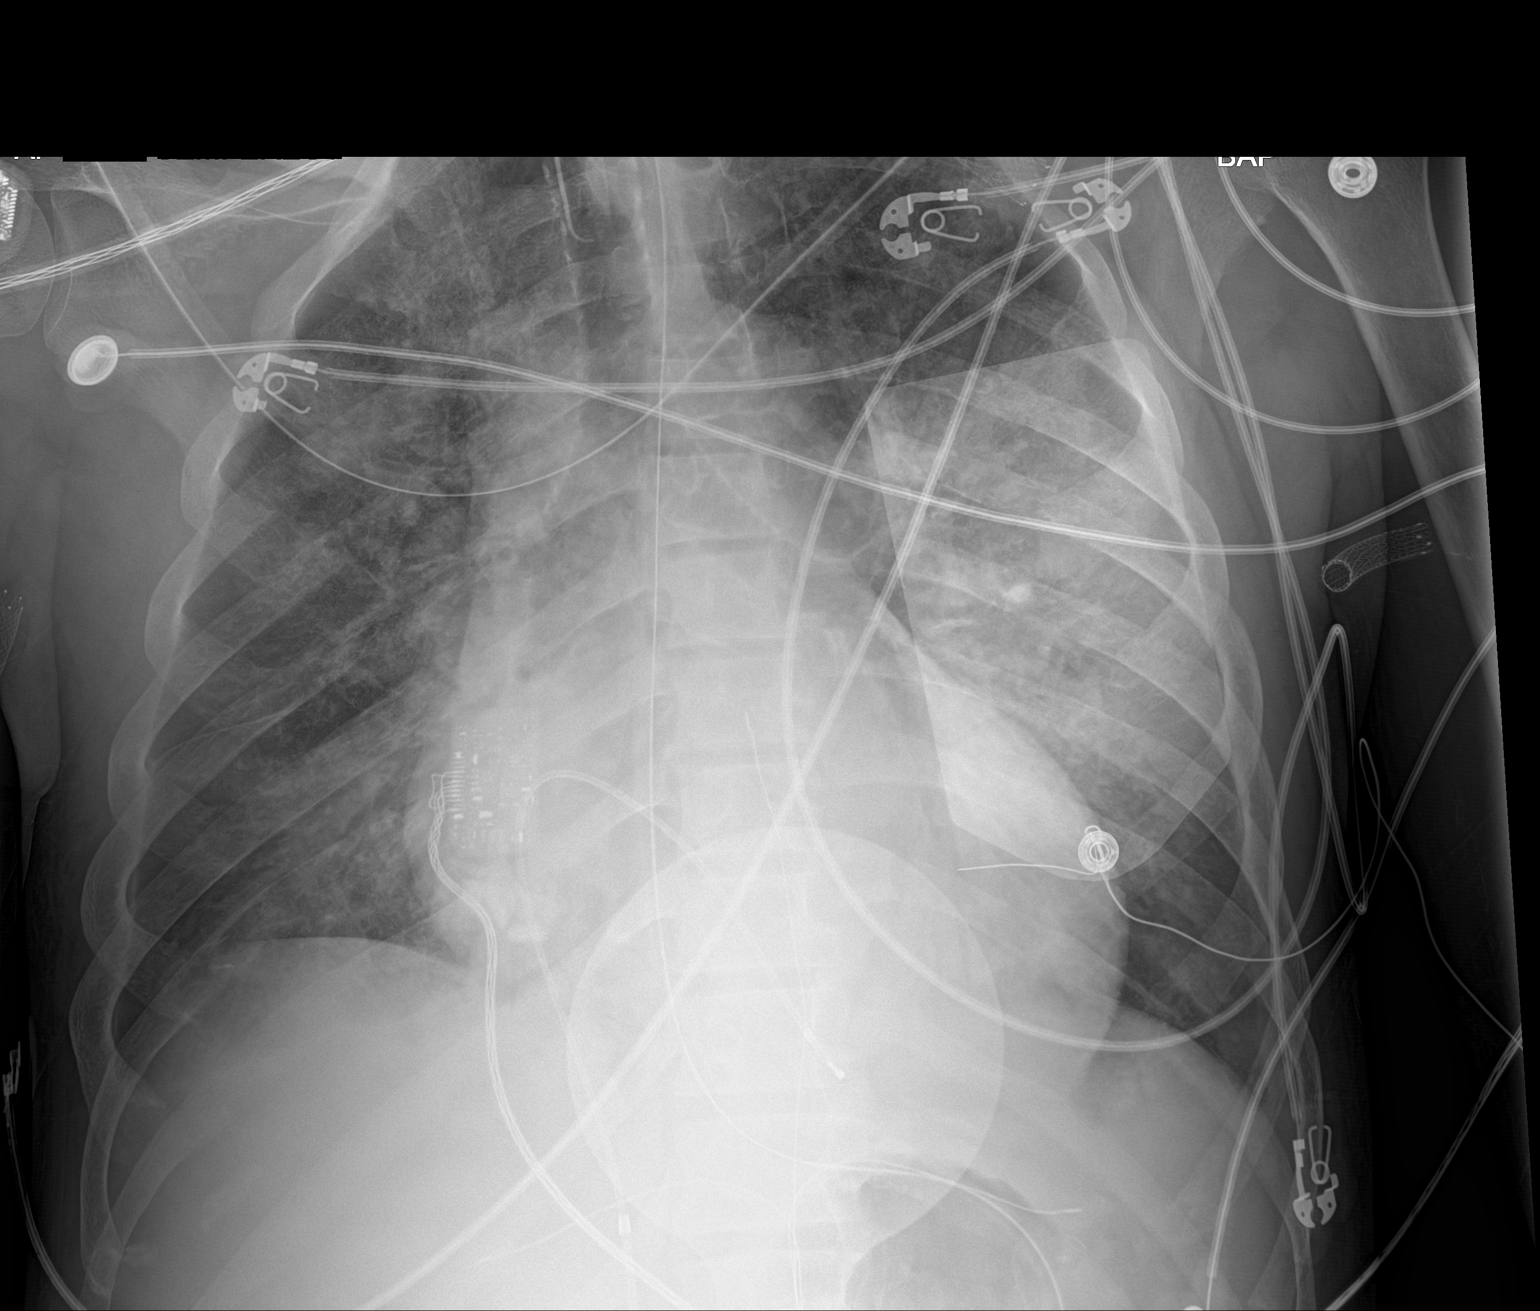

[1 of 1 positions shown; findings below may reference images not displayed]

FINDINGS: The endotracheal tube tip is well positioned above the carina. There
is an enteric tube with tip in the stomach. Transvenous pacer device
is identified with lead in the projection of the right ventricle.
Mild cardiac enlargement. Similar appearance of diffuse pulmonary
edema. Decrease in right pleural effusion.
IMPRESSION: 1. Stable position of transvenous pacer device.
2. Persistent bilateral pulmonary edema pattern.
3. Decrease in right pleural effusion.

## 2019-05-13 IMAGING — DX DG CHEST 1V PORT
1 series · 1 of 1 positions shown · non-contrast
Comparison: 06/07/2018

CLINICAL DATA: Pulmonary edema.

EXAM:
PORTABLE CHEST 1 VIEW

[chest ap]
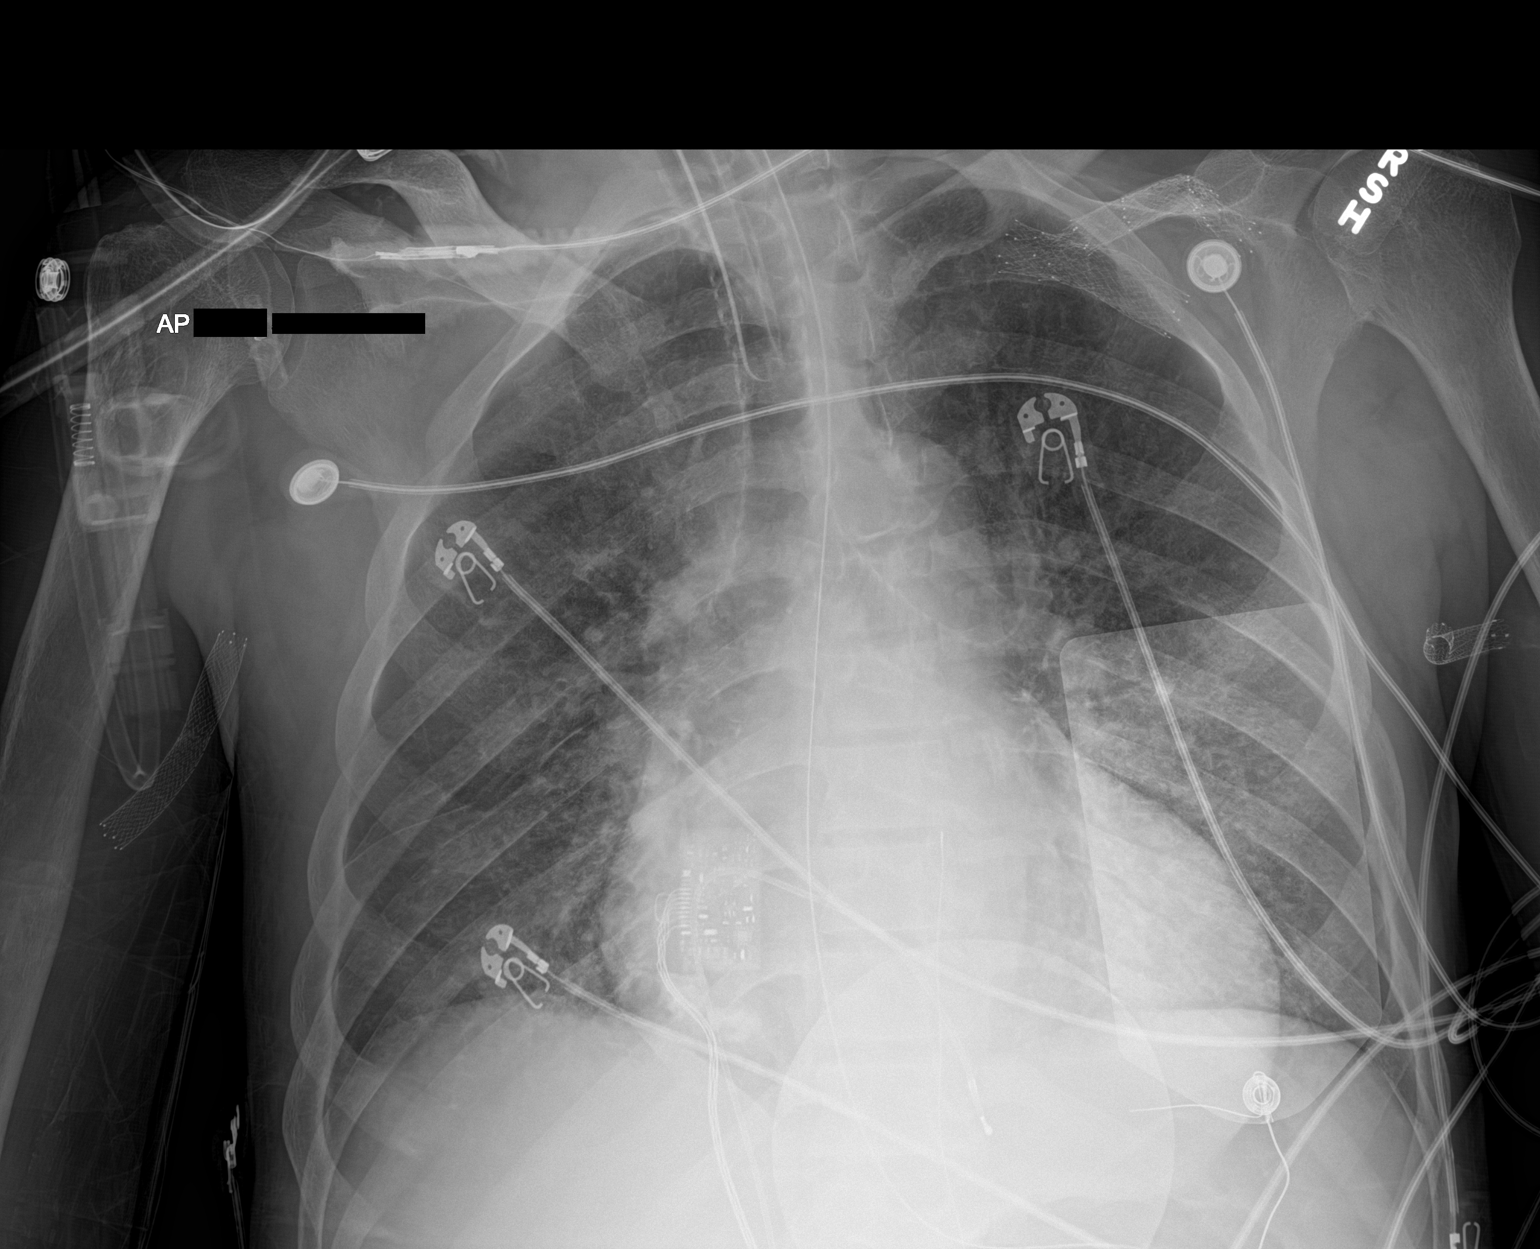

[1 of 1 positions shown; findings below may reference images not displayed]

FINDINGS: 0694 hours. Endotracheal tube tip 5.6 cm above the base of the
carina. The NG tube passes into the stomach although the distal tip
position is not included on the film. Interval improvement in
pulmonary edema pattern. Telemetry leads overlie the chest.
IMPRESSION: Interval improvement in pulmonary edema pattern.

## 2019-05-14 IMAGING — DX DG CHEST 1V PORT
1 series · 1 of 1 positions shown · non-contrast
Comparison: 06/09/2018

CLINICAL DATA: ETT present,,dialysis

EXAM:
PORTABLE CHEST 1 VIEW

[chest ap]
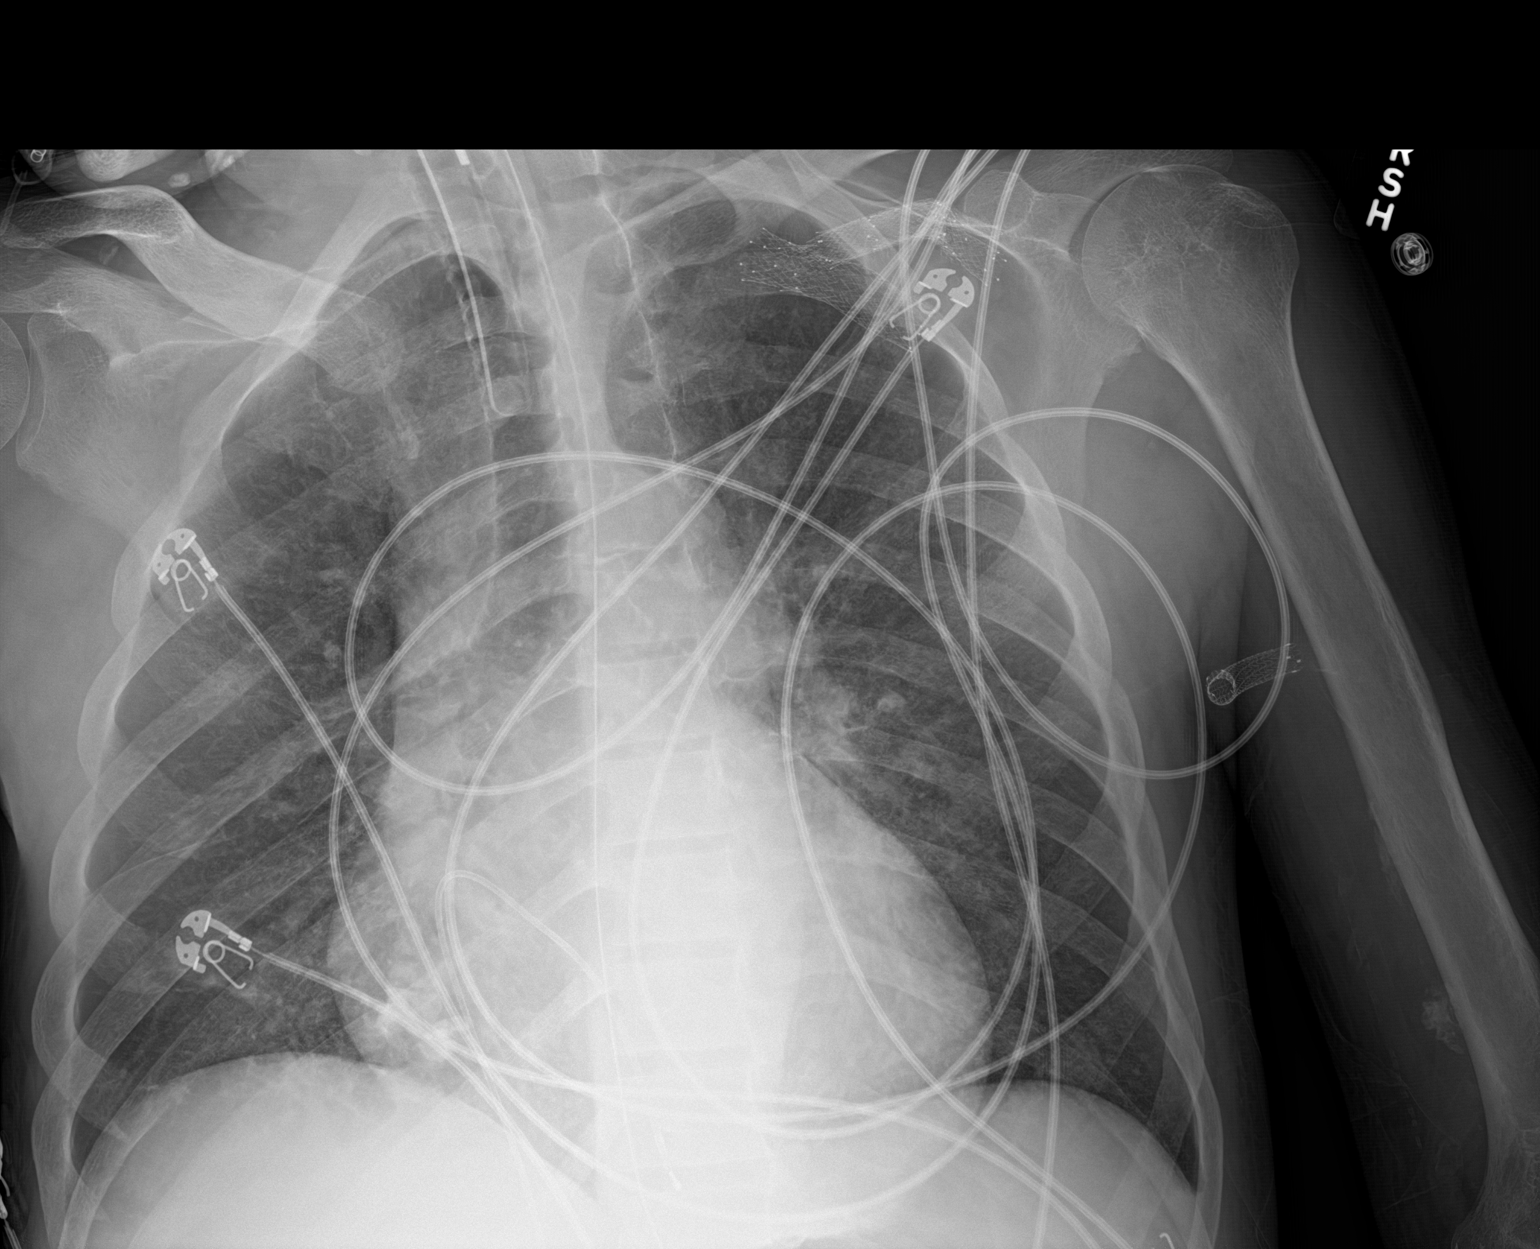

[1 of 1 positions shown; findings below may reference images not displayed]

FINDINGS: Endotracheal tube is in place, 6.4 centimeters above the carina.
Nasogastric tube is in place, tip off the image below the
gastroesophageal junction. Endovascular stents overlie the LEFT
UPPER chest. The heart is mildly enlarged. There are no focal
consolidations or pleural effusions. No pulmonary edema. Stable,
coarse calcifications overlying the RIGHT aspect of the heart.
IMPRESSION: No evidence for acute cardiopulmonary abnormality.

## 2019-05-15 IMAGING — DX DG CHEST 1V PORT
1 series · 1 of 1 positions shown · non-contrast
Comparison: 06/10/2018

CLINICAL DATA: Check endotracheal tube placement

EXAM:
PORTABLE CHEST 1 VIEW

[chest ap]
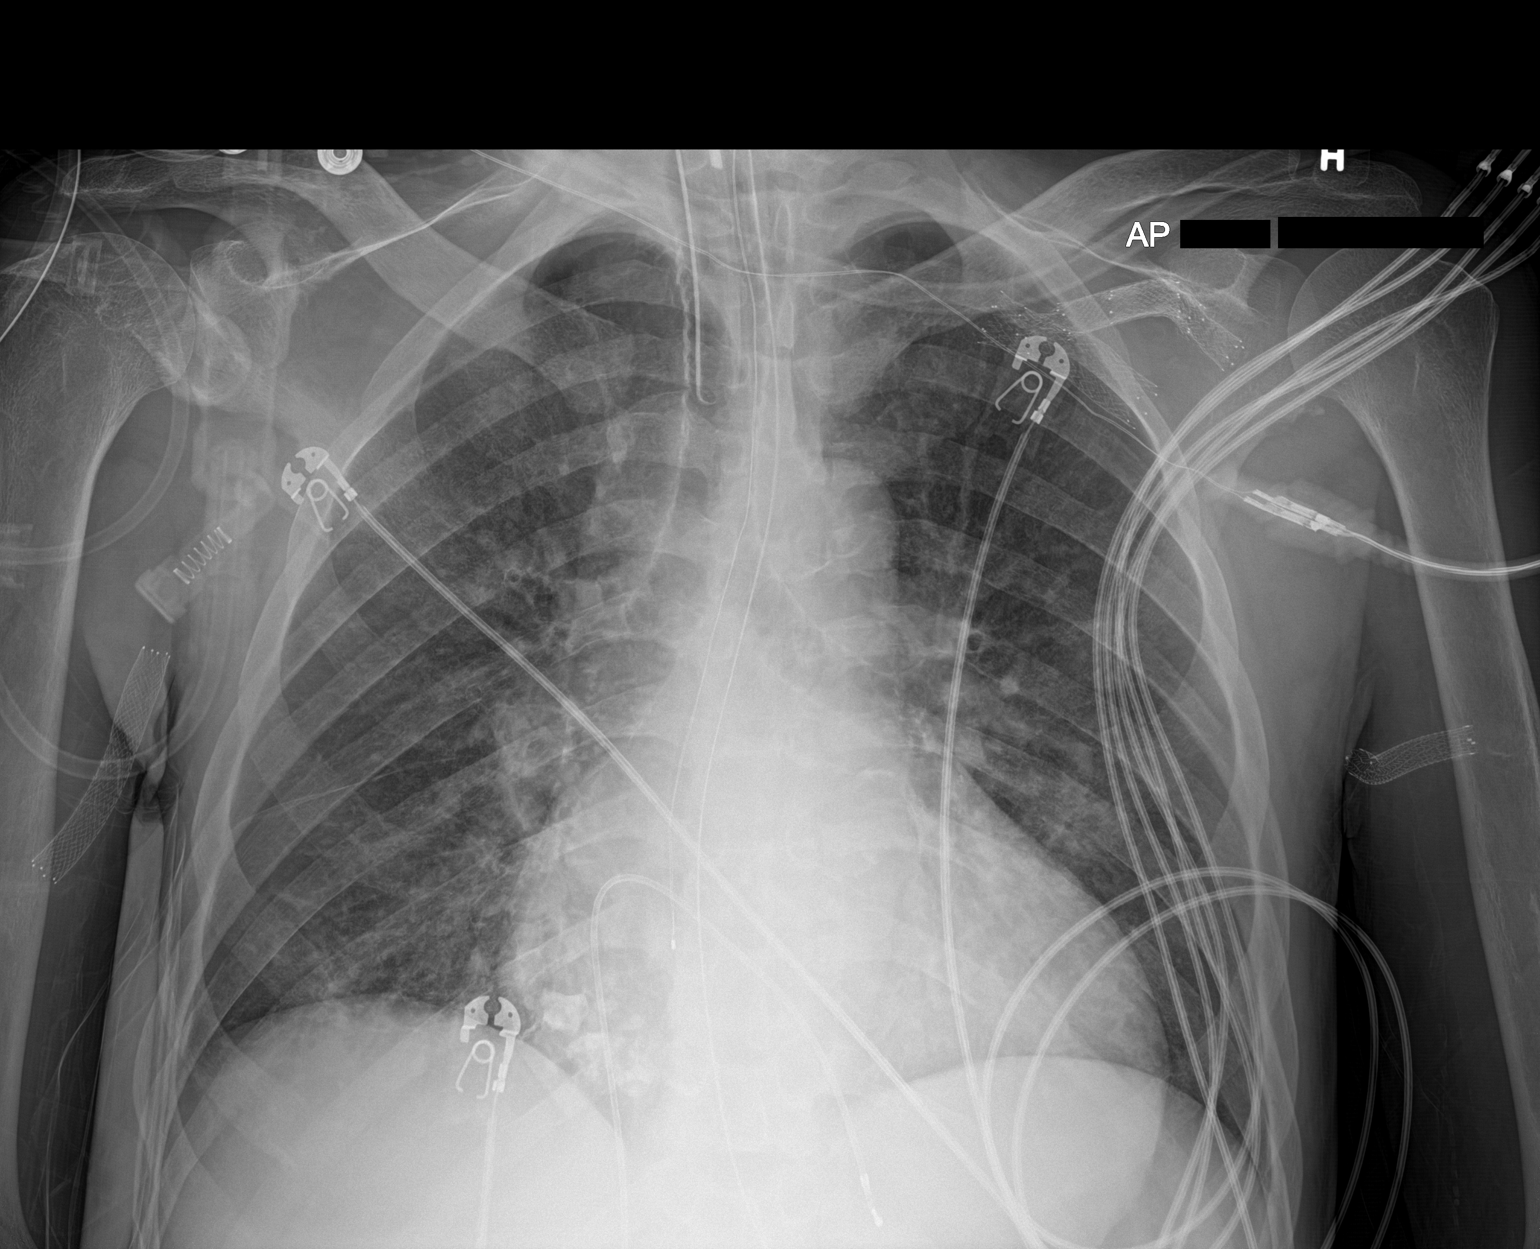

[1 of 1 positions shown; findings below may reference images not displayed]

FINDINGS: Cardiac shadow is enlarged but stable. Endotracheal tube and
nasogastric catheter is well as an esophageal probe are seen.
Multiple vascular stents are noted bilaterally stable from the prior
exams. The lungs are well aerated with mild vascular congestion.
Mild right basilar atelectasis is noted.
IMPRESSION: Mild vascular congestion consistent with early CHF. Mild right
basilar atelectasis is noted

Tubes and lines as described.

## 2019-05-16 IMAGING — DX DG ABDOMEN 1V
1 series · 2 of 2 positions shown · non-contrast
Comparison: CT 05/28/2018

CLINICAL DATA: Encounter for ileus

EXAM:
ABDOMEN - 1 VIEW

[Series 1: abdomen · 0.14mm/px · 2 of 2 slices shown]
[im 1/2]
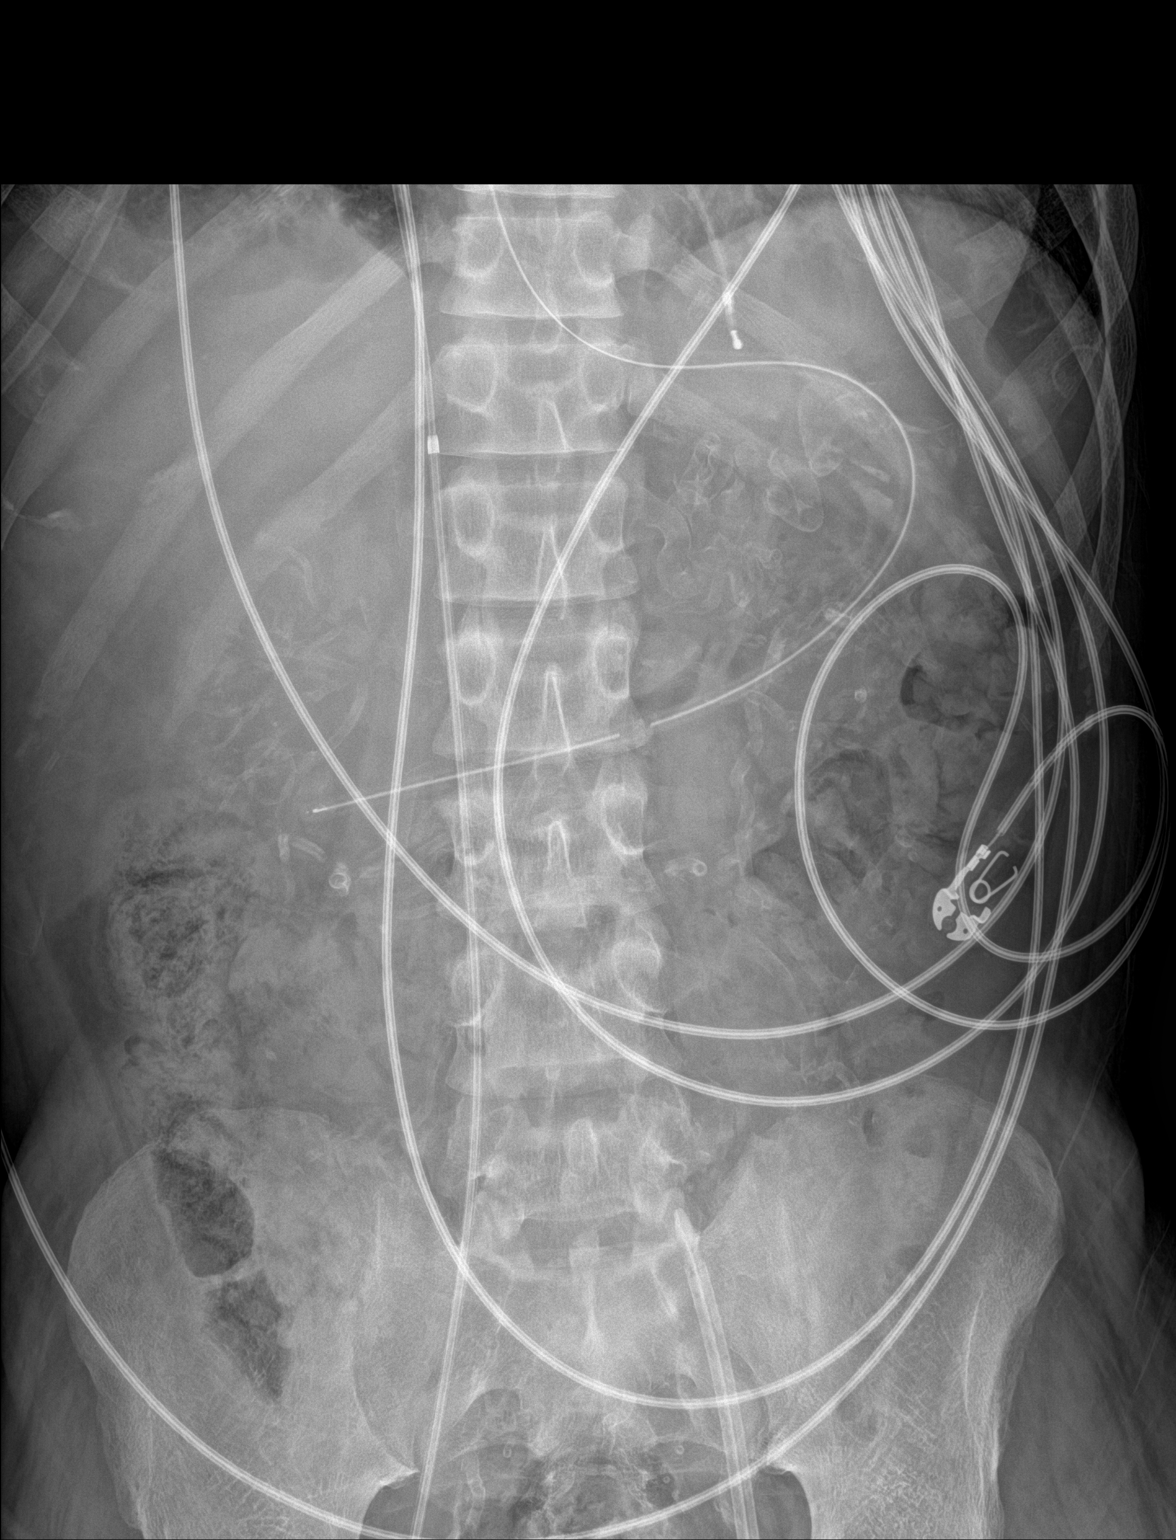
[im 2/2]
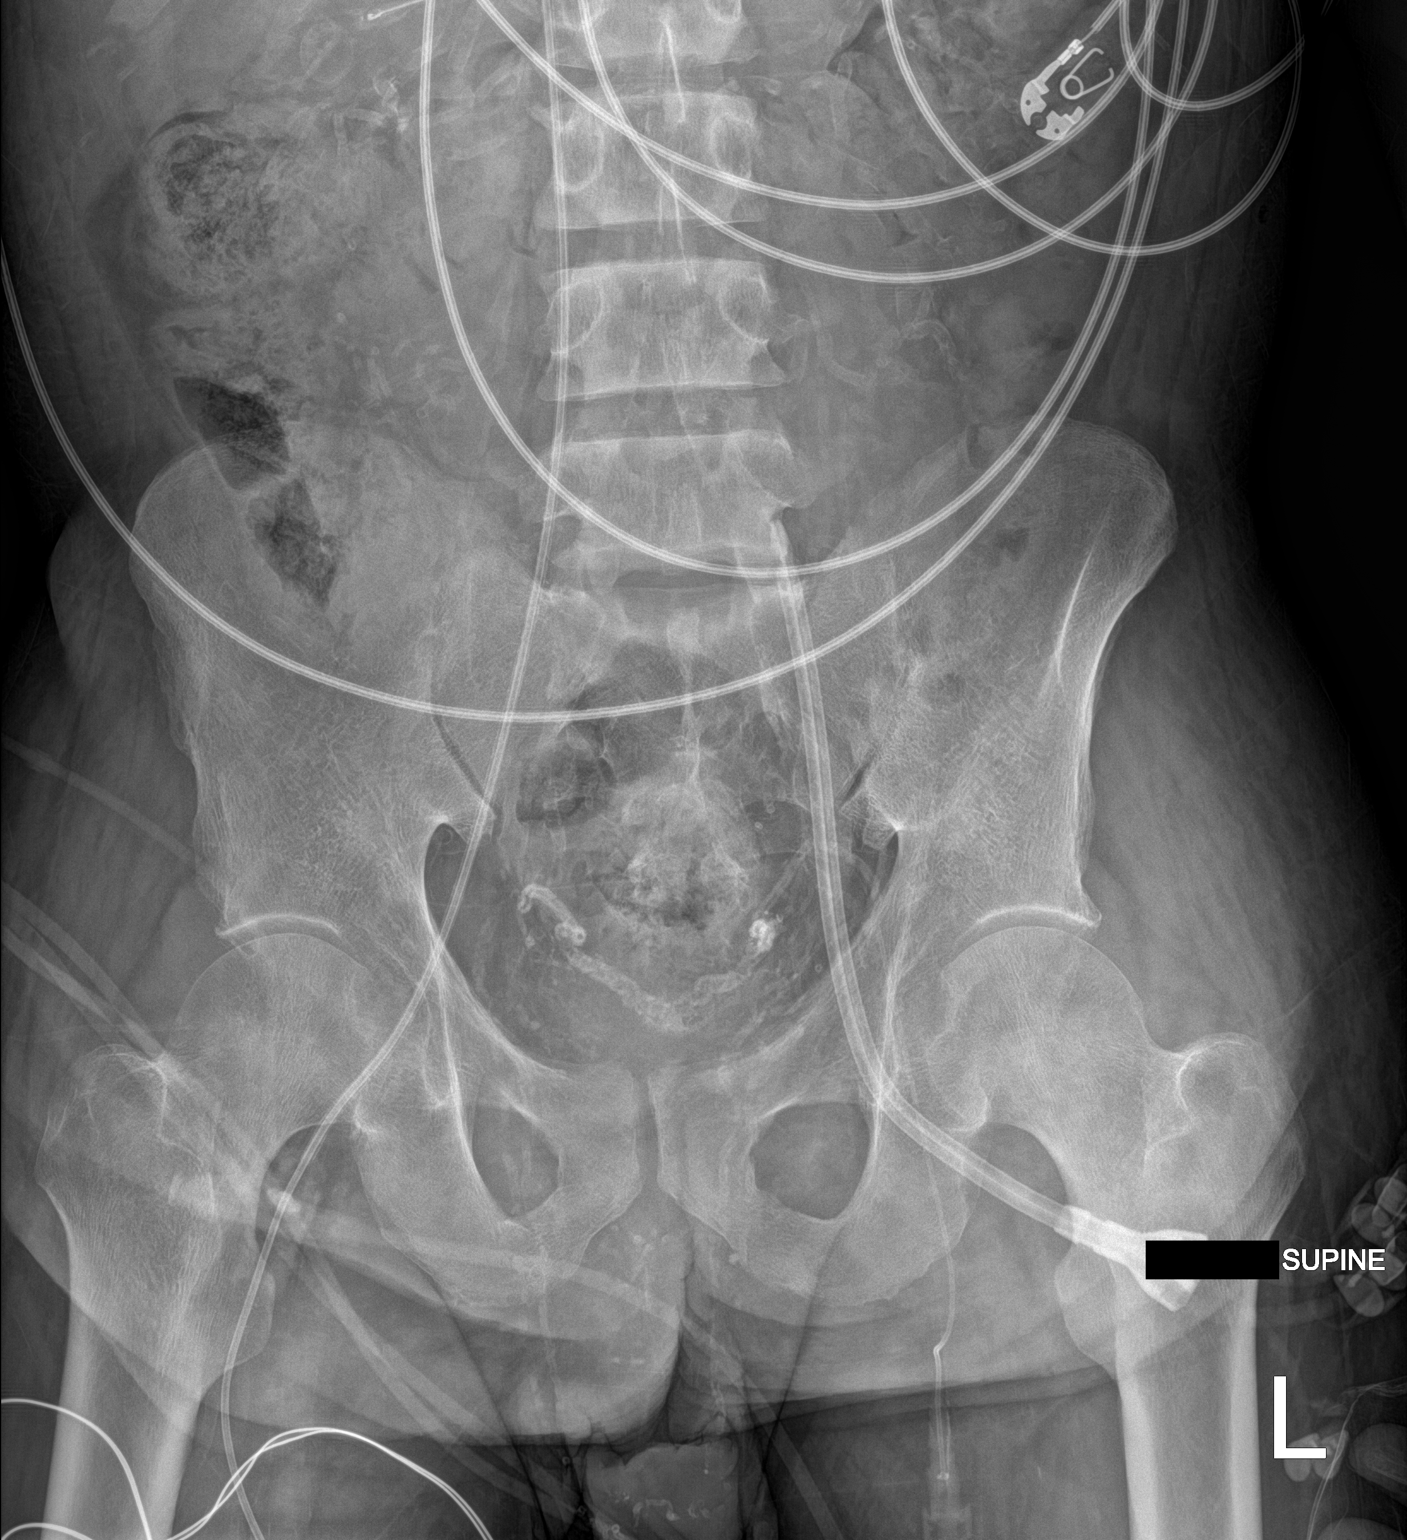

[2 of 2 positions shown; findings below may reference images not displayed]

FINDINGS: Nasogastric tube in the decompressed stomach. Paucity of small bowel
gas. Moderate proximal colonic fecal material, decompressed
distally. Extensive visceral arterial calcifications.

Right femoral transvenous pacing lead to the RV. Left femoral
hemodialysis catheter to the common iliac vein. Small caliber left
femoral arterial catheter to the external iliac artery. Regional
bones unremarkable.
IMPRESSION: 1. Normal bowel gas pattern.
2. Support hardware placement as above.
3. Extensive visceral arterial calcifications.
